# Patient Record
Sex: Male | Born: 1935 | Race: White | Hispanic: No | Marital: Married | Smoking: Former smoker
Health system: Southern US, Community
[De-identification: ages and names within clinical notes are randomized; demographics above are authoritative.]

## PROBLEM LIST (undated history)

## (undated) DIAGNOSIS — I6381 Other cerebral infarction due to occlusion or stenosis of small artery: Secondary | ICD-10-CM

## (undated) DIAGNOSIS — A770 Spotted fever due to Rickettsia rickettsii: Secondary | ICD-10-CM

## (undated) DIAGNOSIS — I700 Atherosclerosis of aorta: Principal | ICD-10-CM

## (undated) DIAGNOSIS — I10 Essential (primary) hypertension: Secondary | ICD-10-CM

## (undated) DIAGNOSIS — R011 Cardiac murmur, unspecified: Secondary | ICD-10-CM

## (undated) DIAGNOSIS — K627 Radiation proctitis: Secondary | ICD-10-CM

## (undated) DIAGNOSIS — I350 Nonrheumatic aortic (valve) stenosis: Secondary | ICD-10-CM

## (undated) DIAGNOSIS — C61 Malignant neoplasm of prostate: Secondary | ICD-10-CM

## (undated) DIAGNOSIS — I5043 Acute on chronic combined systolic (congestive) and diastolic (congestive) heart failure: Secondary | ICD-10-CM

## (undated) DIAGNOSIS — M75101 Unspecified rotator cuff tear or rupture of right shoulder, not specified as traumatic: Secondary | ICD-10-CM

## (undated) DIAGNOSIS — E7800 Pure hypercholesterolemia, unspecified: Secondary | ICD-10-CM

## (undated) DIAGNOSIS — G459 Transient cerebral ischemic attack, unspecified: Secondary | ICD-10-CM

## (undated) DIAGNOSIS — G9340 Encephalopathy, unspecified: Secondary | ICD-10-CM

## (undated) DIAGNOSIS — H3530 Unspecified macular degeneration: Secondary | ICD-10-CM

## (undated) DIAGNOSIS — R7301 Impaired fasting glucose: Secondary | ICD-10-CM

## (undated) DIAGNOSIS — E1129 Type 2 diabetes mellitus with other diabetic kidney complication: Secondary | ICD-10-CM

## (undated) DIAGNOSIS — R809 Proteinuria, unspecified: Secondary | ICD-10-CM

## (undated) DIAGNOSIS — I639 Cerebral infarction, unspecified: Secondary | ICD-10-CM

## (undated) DIAGNOSIS — I5042 Chronic combined systolic (congestive) and diastolic (congestive) heart failure: Secondary | ICD-10-CM

## (undated) DIAGNOSIS — I4891 Unspecified atrial fibrillation: Secondary | ICD-10-CM

## (undated) DIAGNOSIS — G4733 Obstructive sleep apnea (adult) (pediatric): Secondary | ICD-10-CM

## (undated) HISTORY — DX: Unspecified rotator cuff tear or rupture of right shoulder, not specified as traumatic: M75.101

## (undated) HISTORY — DX: Unspecified macular degeneration: H35.30

## (undated) HISTORY — DX: Nonrheumatic aortic (valve) stenosis: I35.0

## (undated) HISTORY — DX: Malignant neoplasm of prostate: C61

## (undated) HISTORY — DX: Chronic combined systolic (congestive) and diastolic (congestive) heart failure: I50.42

## (undated) HISTORY — DX: Radiation proctitis: K62.7

## (undated) HISTORY — DX: Transient cerebral ischemic attack, unspecified: G45.9

## (undated) HISTORY — PX: TONSILLECTOMY AND ADENOIDECTOMY: SUR1326

## (undated) HISTORY — DX: Unspecified atrial fibrillation: I48.91

## (undated) HISTORY — DX: Essential (primary) hypertension: I10

## (undated) HISTORY — PX: OTHER SURGICAL HISTORY: SHX169

## (undated) HISTORY — DX: Proteinuria, unspecified: R80.9

## (undated) HISTORY — DX: Type 2 diabetes mellitus with other diabetic kidney complication: E11.29

## (undated) HISTORY — DX: Obstructive sleep apnea (adult) (pediatric): G47.33

## (undated) HISTORY — DX: Impaired fasting glucose: R73.01

## (undated) HISTORY — DX: Pure hypercholesterolemia, unspecified: E78.00

## (undated) SURGERY — COLONOSCOPY
Anesthesia: Moderate Sedation

---
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
## 2008-02-02 ENCOUNTER — Ambulatory Visit (HOSPITAL_COMMUNITY): Admission: RE | Admit: 2008-02-02 | Discharge: 2008-02-02 | Payer: Self-pay | Admitting: Urology

## 2008-02-12 ENCOUNTER — Ambulatory Visit (HOSPITAL_COMMUNITY): Admission: RE | Admit: 2008-02-12 | Discharge: 2008-02-12 | Payer: Self-pay | Admitting: Urology

## 2008-03-01 ENCOUNTER — Ambulatory Visit: Admission: RE | Admit: 2008-03-01 | Discharge: 2008-03-25 | Payer: Self-pay | Admitting: Radiation Oncology

## 2008-06-13 ENCOUNTER — Ambulatory Visit: Admission: RE | Admit: 2008-06-13 | Discharge: 2008-07-31 | Payer: Self-pay | Admitting: Radiation Oncology

## 2008-08-16 ENCOUNTER — Ambulatory Visit (HOSPITAL_BASED_OUTPATIENT_CLINIC_OR_DEPARTMENT_OTHER): Admission: RE | Admit: 2008-08-16 | Discharge: 2008-08-16 | Payer: Self-pay | Admitting: Urology

## 2008-09-05 ENCOUNTER — Ambulatory Visit: Admission: RE | Admit: 2008-09-05 | Discharge: 2008-10-22 | Payer: Self-pay | Admitting: Radiation Oncology

## 2010-08-04 ENCOUNTER — Ambulatory Visit: Payer: Self-pay | Admitting: Family Medicine

## 2010-08-05 ENCOUNTER — Ambulatory Visit: Payer: BLUE CROSS/BLUE SHIELD | Admitting: Family Medicine

## 2010-08-13 ENCOUNTER — Ambulatory Visit (INDEPENDENT_AMBULATORY_CARE_PROVIDER_SITE_OTHER): Payer: BLUE CROSS/BLUE SHIELD

## 2010-08-13 DIAGNOSIS — R7301 Impaired fasting glucose: Secondary | ICD-10-CM

## 2010-09-10 LAB — POCT I-STAT, CHEM 8
Creatinine, Ser: 1 mg/dL (ref 0.4–1.5)
HCT: 49 % (ref 39.0–52.0)
Hemoglobin: 16.7 g/dL (ref 13.0–17.0)
Potassium: 4.7 mEq/L (ref 3.5–5.1)
Sodium: 139 mEq/L (ref 135–145)
TCO2: 30 mmol/L (ref 0–100)

## 2010-09-10 LAB — COMPREHENSIVE METABOLIC PANEL
ALT: 35 U/L (ref 0–53)
AST: 33 U/L (ref 0–37)
Alkaline Phosphatase: 42 U/L (ref 39–117)
CO2: 33 mEq/L — ABNORMAL HIGH (ref 19–32)
Calcium: 9.6 mg/dL (ref 8.4–10.5)
GFR calc Af Amer: >60 mL/min (ref >60)
Potassium: 5.2 mEq/L — ABNORMAL HIGH (ref 3.5–5.1)
Sodium: 140 mEq/L (ref 135–145)
Total Protein: 6.8 g/dL (ref 6.0–8.3)

## 2010-09-10 LAB — APTT: aPTT: 29 seconds (ref 24–37)

## 2010-09-10 LAB — CBC
MCHC: 33.6 g/dL (ref 30.0–36.0)
RBC: 4.48 MIL/uL (ref 4.22–5.81)
RDW: 13.7 % (ref 11.5–15.5)

## 2010-10-13 NOTE — Op Note (Signed)
NAMEOAKLEY, KOSSMAN NO.:  [id number]   MEDICAL RECORD NO.:  [id number]          PATIENT TYPE:  AMB   LOCATION:  [facility]                         FACILITY:  [facility]   PHYSICIAN:  Ronald L. Earlene Plater, M.D.  DATE OF BIRTH:  02/19/1933   DATE OF PROCEDURE:  08/16/2008  DATE OF DISCHARGE:                               OPERATIVE REPORT   DIAGNOSIS:  Adenocarcinoma of the prostate.   OPERATIVE PROCEDURE:  Robotic arm Nucletron seed implantation  transperineally with iodine 125 seeds and flexible cystourethroscopy.   SURGEON:  Lucrezia Starch. Earlene Plater, M.D.   ASSISTANT:  Artist Pais. Kathrynn Running, M.D.   ANESTHESIA:  General LMA.   ESTIMATED BLOOD LOSS:  15 mL.   TUBES:  None.   COMPLICATIONS:  None.   A total of 69 seeds were implanted with 22 needles at 31.3950 mCi total  apparent activity.   INDICATIONS FOR PROCEDURE:  Mr. Ragone is a very nice 77 year old white  male who presented with an elevated PSA of 4.6 with a free-to-total  ratio of 9.8%.  He subsequently went biopsy of the prostate which  revealed 60% Gleason score 7,  3 + 4 from the right apex and 25% Gleason  score 7, 3 + 4 from the left mid prostate.  Metastatic workup was  negative and he elected to undergo a combination of external beam and  seed.  He has completed his IMRT and is now is for seed radioactive seed  implantation.   DESCRIPTION OF PROCEDURE:  The patient was placed in the supine position  after proper LMA anesthesia and was placed in the dorsal lithotomy  position, prepped and draped with Betadine in a sterile fashion.  A 16-  French Foley catheter was inserted and the bladder was drained.  The B  and K biplanar transrectal ultrasound probe was placed and the prostate  was scanned both axially and sagittally and in 3 dimensions.  Dosimetry  was then calculated for the urethra, the prostate and the rectum, and  dosages were confirmed.  Two holding needles were used on unused  coordinates and after the  electronic and physical grid had been placed.  The base needle was then implanted and serial implantation was performed  in the preplanned coordinates.  A total of 69 iodine 125 seeds were  implanted with 22 needles at 31.3950 mCi total apparent activity.  We  were very comfortable with the location of the seeds.  All hardware was  removed and static images obtained with fluoroscopy, confirming the  location of the seeds.  The wound was dressed sterilely and the patient  was placed in a supine position.  The Foley catheter was removed and  scanned for seeds and there were none within it.  Flexible  cystourethroscopy was then performed with an Olympus flexible  cystourethroscope.  There was moderate trilobar hypertrophy with some  slight prostatic inflammation.  The  bladder had grade 1 trabeculation.  Efflux of clear urine was noted from the normally-placed ureteral  orifices  bilaterally and there were no lesions or seeds or spacers in the bladder  or the urethra.  The flexible cystourethroscope was visually removed and  a new 16-French Foley catheter was inserted and the bladder was drained  clear.  The patient tolerated the procedure well with no complications.  He was taken to the recovery room stable.      Ronald L. Earlene Plater, M.D.  Electronically Signed     RLD/MEDQ  D:  08/16/2008  T:  08/16/2008  Job:  [id number]

## 2010-10-30 DIAGNOSIS — M75101 Unspecified rotator cuff tear or rupture of right shoulder, not specified as traumatic: Secondary | ICD-10-CM

## 2010-10-30 HISTORY — DX: Unspecified rotator cuff tear or rupture of right shoulder, not specified as traumatic: M75.101

## 2010-11-26 ENCOUNTER — Ambulatory Visit
Admission: RE | Admit: 2010-11-26 | Discharge: 2010-11-26 | Disposition: A | Discharge status: Home / Self Care | Payer: Medicare Other | Source: Ambulatory Visit | Attending: Family Medicine | Admitting: Family Medicine

## 2010-11-26 ENCOUNTER — Encounter: Payer: Self-pay | Admitting: Family Medicine

## 2010-11-26 ENCOUNTER — Ambulatory Visit (INDEPENDENT_AMBULATORY_CARE_PROVIDER_SITE_OTHER): Payer: BLUE CROSS/BLUE SHIELD | Admitting: Family Medicine

## 2010-11-26 VITALS — BP 128/82 | HR 80 | Ht 71.0 in | Wt 176.0 lb

## 2010-11-26 DIAGNOSIS — R7301 Impaired fasting glucose: Secondary | ICD-10-CM

## 2010-11-26 DIAGNOSIS — I10 Essential (primary) hypertension: Secondary | ICD-10-CM

## 2010-11-26 DIAGNOSIS — M25511 Pain in right shoulder: Secondary | ICD-10-CM

## 2010-11-26 DIAGNOSIS — C61 Malignant neoplasm of prostate: Secondary | ICD-10-CM | POA: Insufficient documentation

## 2010-11-26 DIAGNOSIS — E7800 Pure hypercholesterolemia, unspecified: Secondary | ICD-10-CM | POA: Insufficient documentation

## 2010-11-26 DIAGNOSIS — M25519 Pain in unspecified shoulder: Secondary | ICD-10-CM

## 2010-11-26 LAB — BASIC METABOLIC PANEL
CO2: 28 mEq/L (ref 19–32)
Chloride: 102 mEq/L (ref 96–112)
Potassium: 5.3 mEq/L (ref 3.5–5.3)

## 2010-11-26 MED ORDER — NAPROXEN 500 MG PO TABS: 500.0000 mg | ORAL_TABLET | Freq: Two times a day (BID) | ORAL | Status: DC

## 2010-11-26 NOTE — Progress Notes (Signed)
Patient presents with complaint of Right shoulder pain since he fell on 6/15.  Tripped while mowing his yard, and landed on his right shoulder and wrist.  R wrist was a little sore initially, but resolved.  The shoulder felt a little better for a day or so, but now is really bothering him.  Has pain brushing his teeth, combing his hair.  He has limited ROM due to the discomfort.  Takes Tylenol Arthritis (regularly), and this seems to help some. Hasn't taken any other pain medications, or tried ice or heat yet.  Has trouble getting comfortable at night to sleep due to pain in shoulder.  Past Medical History  Diagnosis Date  . Hypertension   . Hypercholesteremia   . Impaired fasting glucose   . Prostate cancer radiation + seeding implant (Dr.[name])    Past Surgical History  Procedure Date  . Tonsillectomy and adenoidectomy age 49  . Prostate seed implant     History   Social History  . Marital Status: Married    Spouse Name: N/A    Number of Children: N/A  . Years of Education: N/A   Occupational History  . Not on file.   Social History Main Topics  . Smoking status: Former Smoker    Quit date: 05/31/1980  . Smokeless tobacco: Not on file  . Alcohol Use: Yes     1-2 beers a month.  . Drug Use: No  . Sexually Active: Not on file   Other Topics Concern  . Not on file   Social History Narrative  . No narrative on file    Family History  Problem Relation Age of Onset  . Heart disease Father   . Stroke Sister    Current outpatient prescriptions:Acetaminophen (TYLENOL ARTHRITIS PAIN PO), Take 2 tablets by mouth 2 (two) times daily.  , Disp: , Rfl: ;  aspirin 81 MG tablet, Take 81 mg by mouth daily.  , Disp: , Rfl: ;  diphenhydrAMINE (SOMINEX) 25 MG tablet, Take 25 mg by mouth at bedtime as needed.  , Disp: , Rfl: ;  lovastatin (MEVACOR) 20 MG tablet, Take 40 mg by mouth at bedtime.  , Disp: , Rfl:  metoprolol (TOPROL-XL) 50 MG 24 hr tablet, Take 75 mg by mouth daily. 1 at  night and 1/2 in am , Disp: , Rfl: ;  Multiple Vitamins-Minerals (EYE VITAMINS PO), Take 1 tablet by mouth daily. Patient states vitamin is actually called "pressure vision," supplement for glaucoma. , Disp: , Rfl: ;  Tamsulosin HCl (FLOMAX) 0.4 MG CAPS, Take 0.4 mg by mouth daily.  , Disp: , Rfl:  naproxen (NAPROSYN) 500 MG tablet, Take 1 tablet (500 mg total) by mouth 2 (two) times daily with a meal. Take as needed for pain, Disp: 30 tablet, Rfl: 0  No Known Allergies  ROS:  Denies headaches, numbness, tingling, skin rash, GI complaints, or other concerns.  Denies URI symptoms, SOB, chest pain  PHYSICAL EXAM: BP 128/82  Pulse 80  Ht 5\' 11"  (1.803 m)  Wt 176 lb (79.833 kg)  BMI 24.55 kg/m2 Repeat BP 170/82, which is more consistent with the white coat HTN he usually displays at the office.  Pleasant, elderly gentleman in no distress at rest, some discomfort with arm movement. Neck--no spinal tenderness or muscle spasm, no lymphadenopathy or mass. R shoulder: Some tenderness at R acromion (posteriorly).  Pain with ROM R shoulder. Active and passive abduction only to about 90-100 degrees, forward flexion to about 90 degrees.  Pain with abduction, although no pain and full strength with deltoid testing.  Pain and weakness (vs poor effort due to pain) with internal and external rotation of R shoulder.  Strength 5/5, normal sensation, 2+ pulse  ASSESSMENT/PLAN: 1. Shoulder pain, right  naproxen (NAPROSYN) 500 MG tablet, DG Shoulder Right   injured 6/15 after a fall.  Elements of adhesive capsulitis, +/- rotator cuff tear  2. Essential hypertension, benign  Basic metabolic panel  3. Impaired fasting glucose  Hemoglobin A1c  4. Pure hypercholesterolemia    5. Shoulder pain, right  naproxen (NAPROSYN) 500 MG tablet, DG Shoulder Right   Will check b-met today because it has been over a year since last chem panel with creatinine, and bc I'd like to know renal function since rx'ing NSAIDs.  He has h/o  elevated glu, and no A1c in the last 6 months. NSAID precautions reviewed.  He clearly has some adhesive capsulitis, and may also have rotator cuff tear.  Will check x-rays, and start conservative treatment with ROM exercises and NSAIDs.  Follow up in 1-2 weeks, sooner if worsening.  May need cortisone shot, PT and/or MRI if not improving

## 2010-11-26 NOTE — Patient Instructions (Signed)
Go get xray from [facility] imaging) Do range of motion exercises at least 2-3 times daily (shown to you at visit) Take anti-inflammatories twice daily with food until it feels better Follow up in 1-2 weeks.  May need cortisone shot vs. MRI vs. Physical Therapy, depending on how you're doing

## 2010-11-27 ENCOUNTER — Telehealth: Payer: Self-pay | Admitting: *Deleted

## 2010-11-27 LAB — HEMOGLOBIN A1C
Hgb A1c MFr Bld: 6.2 % — ABNORMAL HIGH (ref <5.7)
Mean Plasma Glucose: 131 mg/dL — ABNORMAL HIGH (ref <117)

## 2010-11-27 NOTE — Telephone Encounter (Addendum)
Message copied by Dorthula Perfect on Fri Nov 27, 2010  1:33 PM ------      Message from: ZOXWR, EVE      Created: Fri Nov 27, 2010  9:50 AM       Advise pt that kidney function is normal.  It is fine for him to take anti-inflammatories as recommended.  His sugar was 125 (not fasting), and A1c was 6.2, which indicates pre-diabetes (similar to past).  Continue to avoid sweets, limit carbs; daily exercise and weight loss encouraged.   Pt notified of lab results.  CM, LPN

## 2010-11-30 ENCOUNTER — Encounter: Payer: Self-pay | Admitting: Family Medicine

## 2010-12-03 ENCOUNTER — Ambulatory Visit (INDEPENDENT_AMBULATORY_CARE_PROVIDER_SITE_OTHER): Payer: BLUE CROSS/BLUE SHIELD | Admitting: Family Medicine

## 2010-12-03 ENCOUNTER — Encounter: Payer: Self-pay | Admitting: Family Medicine

## 2010-12-03 VITALS — BP 144/88 | HR 68 | Ht 71.0 in | Wt 176.0 lb

## 2010-12-03 DIAGNOSIS — M25519 Pain in unspecified shoulder: Secondary | ICD-10-CM

## 2010-12-03 DIAGNOSIS — M25511 Pain in right shoulder: Secondary | ICD-10-CM

## 2010-12-03 NOTE — Patient Instructions (Signed)
Call next week and let me know if you are having ongoing weakness in the right arm.  I suspect that you will continue to have weakness, and will need an MRI.  Call us next week and we will get it scheduled for you.

## 2010-12-03 NOTE — Progress Notes (Signed)
Patient presents for followup on right shoulder pain.  Has been taking the naprosyn without side effects.  Notices significant improvement in range of motion and decreased pain.  Still having some pain, especially at night, when trying to sleep.  But overall, even the night time pain is better.  Still having problems with pain with external rotation of R shoulder.  Past Medical History  Diagnosis Date  . Hypertension   . Hypercholesteremia   . Impaired fasting glucose   . Prostate cancer radiation + seeding implant (Dr.[name])    Past Surgical History  Procedure Date  . Tonsillectomy and adenoidectomy age 16  . Prostate seed implant     History   Social History  . Marital Status: Married    Spouse Name: N/A    Number of Children: N/A  . Years of Education: N/A   Occupational History  . Not on file.   Social History Main Topics  . Smoking status: Former Smoker    Quit date: 05/31/1980  . Smokeless tobacco: Not on file  . Alcohol Use: Yes     1-2 beers a month.  . Drug Use: No  . Sexually Active: Not on file   Other Topics Concern  . Not on file   Social History Narrative  . No narrative on file    Family History  Problem Relation Age of Onset  . Heart disease Father   . Stroke Sister     Current outpatient prescriptions:Acetaminophen (TYLENOL ARTHRITIS PAIN PO), Take 2 tablets by mouth 2 (two) times daily.  , Disp: , Rfl: ;  aspirin 81 MG tablet, Take 81 mg by mouth daily.  , Disp: , Rfl: ;  diphenhydrAMINE (SOMINEX) 25 MG tablet, Take 25 mg by mouth at bedtime as needed.  , Disp: , Rfl: ;  lovastatin (MEVACOR) 20 MG tablet, Take 40 mg by mouth at bedtime.  , Disp: , Rfl:  metoprolol (TOPROL-XL) 50 MG 24 hr tablet, Take 75 mg by mouth daily. 1 at night and 1/2 in am , Disp: , Rfl: ;  Multiple Vitamins-Minerals (EYE VITAMINS PO), Take 1 tablet by mouth daily. Patient states vitamin is actually called "pressure vision," supplement for glaucoma. , Disp: , Rfl: ;  naproxen  (NAPROSYN) 500 MG tablet, Take 1 tablet (500 mg total) by mouth 2 (two) times daily with a meal. Take as needed for pain, Disp: 30 tablet, Rfl: 0 Tamsulosin HCl (FLOMAX) 0.4 MG CAPS, Take 0.4 mg by mouth daily.  , Disp: , Rfl:   No Known Allergies  ROS: Denies headaches, chest pain.  Denies numbness/tingling.  Denies abdominal pain, bleeding or bruising.  PHYSICAL EXAM: BP 144/88  Pulse 68  Ht 5\' 11"  (1.803 m)  Wt 176 lb (79.833 kg)  BMI 24.55 kg/m2 Well developed, pleasant male, accompanied by his wife, in no distress R shoulder--no tenderness at [facility] joint or elsewhere.  Strength is 5/5 except significantly diminished (3/5) with external rotation against resistance.  Also decreased ROM with external rotation.  ROM with abduction 150 degrees, forward flexion 135 degrees--significantly improved from last week. 2+ pulses, brisk cap refill  ASSESSMENT/PLAN: 1. Shoulder pain, right    due to ongoing weakness with external rotation, suspect rotator cuff tear. Improvement in ROM and pain with NSAIDs.  Declines MRI--wants to wait 1 week   Continue the Naproxen BID.  Expect him to have improvement in pain, but ongoing weakness--taught wife how to check.  Call next week to order MRI if ongoing weakness.  Discussed that if MRI is abnormal, I would refer to ortho--but to have good discussion with ortho about whether or not repair is indicated in his situation

## 2010-12-10 ENCOUNTER — Other Ambulatory Visit: Payer: Self-pay | Admitting: Family Medicine

## 2010-12-10 ENCOUNTER — Telehealth: Payer: Self-pay | Admitting: *Deleted

## 2010-12-10 NOTE — Telephone Encounter (Signed)
Advise pt--I'm happy to give him more time.  If he is feeling better, even if there is some residual weakness, we don't HAVE to proceed with MRI.  If he has persistent weakness, he likely has a rotator cuff tear.  But if he is otherwise asymptomatic, then we wouldn't proceed with surgical intervention, so no reason to rush to get an MRI.  Follow up for re-evaluation (and to have MRI ordered) if he is having ongoing pain, or weakness that interferes with his daily activities

## 2010-12-10 NOTE — Telephone Encounter (Signed)
Patient called with an update of his progress, he said you instructed him to do so. He states that he has really improved some. His ROM is almost normal, strength still week but has gained strength since last week. He states that he is almost no pain and his shoulder is no longer bothering him at night. He wonders if it just needs some more time? Thanks.

## 2010-12-10 NOTE — Telephone Encounter (Signed)
Spoke with patient, he will give it some time and he will schedule appt with Dr.Knapp for re evaluation if he is having ongoing pain, or weakness that interferes with his daily activities.

## 2010-12-11 NOTE — Telephone Encounter (Signed)
Is it ok to fill this for pt?

## 2010-12-28 ENCOUNTER — Telehealth: Payer: Self-pay | Admitting: *Deleted

## 2010-12-28 NOTE — Telephone Encounter (Signed)
Patient stating that

## 2010-12-31 ENCOUNTER — Ambulatory Visit (INDEPENDENT_AMBULATORY_CARE_PROVIDER_SITE_OTHER): Payer: BLUE CROSS/BLUE SHIELD | Admitting: Family Medicine

## 2010-12-31 ENCOUNTER — Encounter: Payer: Self-pay | Admitting: Family Medicine

## 2010-12-31 VITALS — BP 132/82 | HR 72 | Ht 71.0 in | Wt 172.0 lb

## 2010-12-31 DIAGNOSIS — K921 Melena: Secondary | ICD-10-CM

## 2010-12-31 DIAGNOSIS — M25511 Pain in right shoulder: Secondary | ICD-10-CM

## 2010-12-31 DIAGNOSIS — I10 Essential (primary) hypertension: Secondary | ICD-10-CM

## 2010-12-31 DIAGNOSIS — M25519 Pain in unspecified shoulder: Secondary | ICD-10-CM

## 2010-12-31 NOTE — Patient Instructions (Signed)
Avoid anti-inflammatories and aspirin.  Send in hemoccult cards next week (or the week after).  Will refer to GI if there is blood in the stool (microscopic).  If there is no blood seen in stool on cards, it is still recommended that you have a routine colonoscopy--please consider this.  We are scheduling MRI, and once results are known, expect to refer you to orthopedist (Dr. Luiz Blare, as you request)

## 2010-12-31 NOTE — Progress Notes (Signed)
Patient presents to follow up on R shoulder pain.  He is no longer having as much pain, no longer being kept awake at night with pain.  Still has weakness when trying to raise arm, arm seems to fatigue easily, even just with raising coffee cup to mouth--arm is tired before finishing the coffee.  Less pain, but ongoing weakness.  This is interfering with his normal activities, relying more on use of his left arm.  He is now ready for MRI (previously offered).  10-14 days ago noted blood in stool.  Thought it might have been related to the Naprosyn, so he stopped it 10 days ago, and also stopped the Tylenol Arthritis.  No further blood in his stool--none for about 5-6 days.  Initially was smear of blood noted on toilet paper.  While at the beach, the blood turned the bowl pink.  Has been having some constipation and straining recently.  HTN--BP's are improved at home, rarely over 140.  Past Medical History  Diagnosis Date  . Hypertension   . Hypercholesteremia   . Impaired fasting glucose   . Prostate cancer radiation + seeding implant (Dr.[name])    Past Surgical History  Procedure Date  . Tonsillectomy and adenoidectomy age 34  . Prostate seed implant     History   Social History  . Marital Status: Married    Spouse Name: N/A    Number of Children: N/A  . Years of Education: N/A   Occupational History  . Not on file.   Social History Main Topics  . Smoking status: Former Smoker    Quit date: 05/31/1980  . Smokeless tobacco: Not on file  . Alcohol Use: Yes     1-2 beers a month.  . Drug Use: No  . Sexually Active: Not on file   Other Topics Concern  . Not on file   Social History Narrative  . No narrative on file    Family History  Problem Relation Age of Onset  . Heart disease Father   . Stroke Sister     Current outpatient prescriptions:diphenhydrAMINE (SOMINEX) 25 MG tablet, Take 25 mg by mouth at bedtime as needed.  , Disp: , Rfl: ;  lovastatin (MEVACOR) 20 MG  tablet, Take 40 mg by mouth at bedtime.  , Disp: , Rfl: ;  metoprolol (TOPROL-XL) 50 MG 24 hr tablet, Take 75 mg by mouth daily. 1 at night and 1/2 in am , Disp: , Rfl:  Multiple Vitamins-Minerals (EYE VITAMINS PO), Take 1 tablet by mouth daily. Patient states vitamin is actually called "pressure vision," supplement for glaucoma. , Disp: , Rfl: ;  Tamsulosin HCl (FLOMAX) 0.4 MG CAPS, Take 0.4 mg by mouth daily.  , Disp: , Rfl: ;  Acetaminophen (TYLENOL ARTHRITIS PAIN PO), Take 2 tablets by mouth 2 (two) times daily.  , Disp: , Rfl: ;  aspirin 81 MG tablet, Take 81 mg by mouth daily.  , Disp: , Rfl:  naproxen (NAPROSYN) 500 MG tablet, TAKE 1 TABLET (500 MG TOTAL) BY MOUTH 2 (TWO) TIMES DAILY WITH A MEAL. TAKE AS NEEDED FOR PAIN, Disp: 30 tablet, Rfl: 0 (stopped Tylenol, aspirin and Naprosyn after noting blood in stool 10-14 days ago)  No Known Allergies  ROS:  Denies abdominal pain, fever, numbness, tingling.  + arm weakness. +recent blood in stool, resolved. No headaches, chest pain or other concerns.  PHYSICAL EXAM: BP 132/82  Pulse 72  Ht 5\' 11"  (1.803 m)  Wt 172 lb (78.019 kg)  BMI 23.99 kg/m2 Pleasant, elderly male, in no distress FROM upper extremities, except for somewhat limited external rotation.  Significant weakness with external rotation against resistance and with supraspinatous testing RUE.  Remainder of strength intact.  Normal sensation. 2+ distal pulses  ASSESSMENT/PLAN: 1. Shoulder pain, right  MR Shoulder Right Wo Contrast   suspect rotator cuff tear.  get MRI, and refer to ortho (pt requests Dr. Luiz Blare)  2. Blood in stool     likely related to NSAID use.  Has never had colonoscopy (declines), which is encouraged.  Avoid NSAID's and ASA and check hemoccult cards next week  3. Essential hypertension, benign     controlled  4. Shoulder pain, right  MR Shoulder Right Wo Contrast    Suspect R rotator cuff tear.  Check MRI and refer to ortho (Dr. Luiz Blare) after results  received  Has never had a colonoscopy--has declined it in the past.  Hasn't done hemoccult cards in a while.  Plan: given hemoccult cards.  Even if negative, he is to strongly consider colonoscopy (highly recommended by me).  If positive, we will refer to GI

## 2011-01-01 ENCOUNTER — Ambulatory Visit
Admission: RE | Admit: 2011-01-01 | Discharge: 2011-01-01 | Disposition: A | Discharge status: Home / Self Care | Payer: Medicare Other | Source: Ambulatory Visit | Attending: Family Medicine | Admitting: Family Medicine

## 2011-01-01 ENCOUNTER — Encounter: Payer: Self-pay | Admitting: Family Medicine

## 2011-01-01 ENCOUNTER — Telehealth: Payer: Self-pay

## 2011-01-01 DIAGNOSIS — M75101 Unspecified rotator cuff tear or rupture of right shoulder, not specified as traumatic: Secondary | ICD-10-CM | POA: Insufficient documentation

## 2011-01-01 DIAGNOSIS — M25511 Pain in right shoulder: Secondary | ICD-10-CM

## 2011-01-01 NOTE — Telephone Encounter (Signed)
Pt informed and request sent with records

## 2011-02-03 ENCOUNTER — Other Ambulatory Visit: Payer: BLUE CROSS/BLUE SHIELD

## 2011-02-03 ENCOUNTER — Encounter: Payer: Self-pay | Admitting: Family Medicine

## 2011-02-03 DIAGNOSIS — K921 Melena: Secondary | ICD-10-CM

## 2011-02-03 LAB — POC HEMOCCULT BLD/STL (HOME/3-CARD/SCREEN)
Card #2 Fecal Occult Blod, POC: NEGATIVE
Card #3 Fecal Occult Blood, POC: NEGATIVE
Fecal Occult Blood, POC: NEGATIVE

## 2011-02-04 ENCOUNTER — Ambulatory Visit (INDEPENDENT_AMBULATORY_CARE_PROVIDER_SITE_OTHER): Payer: BLUE CROSS/BLUE SHIELD | Admitting: Family Medicine

## 2011-02-04 ENCOUNTER — Encounter: Payer: Self-pay | Admitting: Family Medicine

## 2011-02-04 DIAGNOSIS — R011 Cardiac murmur, unspecified: Secondary | ICD-10-CM

## 2011-02-04 DIAGNOSIS — R7301 Impaired fasting glucose: Secondary | ICD-10-CM

## 2011-02-04 DIAGNOSIS — I10 Essential (primary) hypertension: Secondary | ICD-10-CM

## 2011-02-04 DIAGNOSIS — L259 Unspecified contact dermatitis, unspecified cause: Secondary | ICD-10-CM

## 2011-02-04 DIAGNOSIS — E7800 Pure hypercholesterolemia, unspecified: Secondary | ICD-10-CM

## 2011-02-04 DIAGNOSIS — L309 Dermatitis, unspecified: Secondary | ICD-10-CM

## 2011-02-04 DIAGNOSIS — M75101 Unspecified rotator cuff tear or rupture of right shoulder, not specified as traumatic: Secondary | ICD-10-CM

## 2011-02-04 MED ORDER — NYSTATIN-TRIAMCINOLONE 100000-0.1 UNIT/GM-% EX OINT: TOPICAL_OINTMENT | Freq: Two times a day (BID) | CUTANEOUS | Status: DC

## 2011-02-04 NOTE — Progress Notes (Signed)
Patient presents for 6 month follow-up.  BP's at home usually 130-140/70-75.  Yesterday was a little lower at 113/70.  His machine has been verified in the past.  Denies headaches, chest pain, palpitations, edema, shortness of breath.  He can tell when BP is running a little low, like yesterday, feels slightly dizzy.  Impaired fasting glucose/pre-diabetes.  Denies polydipsia or polyuria, although has some increased frequency at night. Still drinking 2-3 beers/night.  He has noted "pimples" on his neck and upper chest, gradually for months, worse recently.  They are irritating, not pruritic, but finds he "messes with them".  Wondering if it could be from the Lovastatin.  Saw Dr. Luiz Blare for his rotator cuff tear.   Gave him a cortisone shot, giving him a little more time.  Has an appointment next week to decide about surgery.  The pain is controllable, but has ongoing weakness which affects him daily--hard to drink a cup of coffee, unable to fish.  He would like to proceed with surgery (if recommended to him at his appt next week).  Past Medical History  Diagnosis Date  . Hypertension   . Hypercholesteremia   . Impaired fasting glucose   . Prostate cancer radiation + seeding implant (Dr.[name])  . Rotator cuff tear, right 10/2010    supraspinatous and infraspinatous    Past Surgical History  Procedure Date  . Tonsillectomy and adenoidectomy age 77  . Prostate seed implant     History   Social History  . Marital Status: Married    Spouse Name: N/A    Number of Children: N/A  . Years of Education: N/A   Occupational History  . Not on file.   Social History Main Topics  . Smoking status: Former Smoker    Quit date: 05/31/1980  . Smokeless tobacco: Not on file  . Alcohol Use: Yes     2-3 beers/night  . Drug Use: No  . Sexually Active: Not on file   Other Topics Concern  . Not on file   Social History Narrative  . No narrative on file    Family History  Problem Relation Age  of Onset  . Heart disease Father   . Stroke Sister    Current Outpatient Prescriptions on File Prior to Visit  Medication Sig Dispense Refill  . Acetaminophen (TYLENOL ARTHRITIS PAIN PO) Take 2 tablets by mouth 2 (two) times daily.        [name] aspirin 81 MG tablet Take 81 mg by mouth daily.        . diphenhydrAMINE (SOMINEX) 25 MG tablet Take 25 mg by mouth at bedtime as needed.        . lovastatin (MEVACOR) 20 MG tablet Take 40 mg by mouth at bedtime.        . metoprolol (TOPROL-XL) 50 MG 24 hr tablet Take 75 mg by mouth daily. 1 at night and 1/2 in am       . Multiple Vitamins-Minerals (EYE VITAMINS PO) Take 1 tablet by mouth daily. Patient states vitamin is actually called "pressure vision," supplement for glaucoma.       . Tamsulosin HCl (FLOMAX) 0.4 MG CAPS Take 0.4 mg by mouth daily.         No Known Allergies  ROS:  Denies headaches, chest pain, URI symptoms, fever, SOB.  Some urinary frequency mainly at night, no voiding issues during day.  Denies fever, URI symptoms, or other concerns. + rash per HPI  PHYSICAL EXAM: Pleasant, elderly male  in no distress.  Mildly anxious-appearing BP 184/90  Pulse 64  Temp(Src) 97.6 F (36.4 C) (Oral)  Ht 5\' 11"  (1.803 m)  Wt 172 lb (78.019 kg)  BMI 23.99 kg/m2 Neck: no lymphadenopathy Heart: 3/6 SEM, loudest at RUSB Lungs: clear bilaterally Skin: Neck and chest--flaky rash, some round patches, others more confluent.  Slightly pink, no warmth.  No central clearing Extremities: no edema  ASSESSMENT/PLAN: 1. Essential hypertension, benign  Comprehensive metabolic panel, Ambulatory referral to Cardiology  2. Impaired fasting glucose    3. Pure hypercholesterolemia  Lipid panel  4. Rotator cuff tear, right    5. Heart murmur  Ambulatory referral to Cardiology  6. Dermatitis  nystatin-triamcinolone (MYCOLOG) ointment    Refer to cardiology--eval murmur with echo and pre-op assessment, as he will likely be having shoulder surgery in near  future Dermatitis--doubt related to medications.  Treat with steroid/antifungal combination for 1-2 weeks.  If no improvement, refer to dermatologist. White coat component to HTN--continue to monitor at home  Plans to get flu shot with his wife at pharmacy  F/u 6 months, sooner prn

## 2011-02-05 ENCOUNTER — Encounter: Payer: Self-pay | Admitting: Family Medicine

## 2011-02-05 DIAGNOSIS — E7800 Pure hypercholesterolemia, unspecified: Secondary | ICD-10-CM

## 2011-02-05 DIAGNOSIS — E1169 Type 2 diabetes mellitus with other specified complication: Secondary | ICD-10-CM | POA: Insufficient documentation

## 2011-02-05 LAB — LIPID PANEL
Cholesterol: 193 mg/dL (ref 0–200)
LDL Cholesterol: 100 mg/dL — ABNORMAL HIGH (ref 0–99)
Total CHOL/HDL Ratio: 3 Ratio
VLDL: 28 mg/dL (ref 0–40)

## 2011-02-05 LAB — COMPREHENSIVE METABOLIC PANEL
ALT: 30 U/L (ref 0–53)
AST: 27 U/L (ref 0–37)
Alkaline Phosphatase: 33 U/L — ABNORMAL LOW (ref 39–117)
Creat: 0.82 mg/dL (ref 0.50–1.35)
Total Bilirubin: 0.6 mg/dL (ref 0.3–1.2)

## 2011-02-23 ENCOUNTER — Ambulatory Visit (INDEPENDENT_AMBULATORY_CARE_PROVIDER_SITE_OTHER): Payer: BLUE CROSS/BLUE SHIELD | Admitting: Cardiology

## 2011-02-23 ENCOUNTER — Ambulatory Visit (HOSPITAL_COMMUNITY): Payer: Medicare Other | Attending: Cardiology | Admitting: Radiology

## 2011-02-23 ENCOUNTER — Encounter: Payer: Self-pay | Admitting: Cardiology

## 2011-02-23 DIAGNOSIS — E7800 Pure hypercholesterolemia, unspecified: Secondary | ICD-10-CM

## 2011-02-23 DIAGNOSIS — E785 Hyperlipidemia, unspecified: Secondary | ICD-10-CM | POA: Insufficient documentation

## 2011-02-23 DIAGNOSIS — E119 Type 2 diabetes mellitus without complications: Secondary | ICD-10-CM | POA: Insufficient documentation

## 2011-02-23 DIAGNOSIS — I10 Essential (primary) hypertension: Secondary | ICD-10-CM

## 2011-02-23 DIAGNOSIS — R011 Cardiac murmur, unspecified: Secondary | ICD-10-CM | POA: Insufficient documentation

## 2011-02-23 DIAGNOSIS — M75101 Unspecified rotator cuff tear or rupture of right shoulder, not specified as traumatic: Secondary | ICD-10-CM

## 2011-02-23 NOTE — Progress Notes (Signed)
HPI The patient presents for evaluation of a heart murmur.  He also is having right shoulder surgery and preoperative consultation is requested.  He has no past cardiac history. He has had some labile blood pressures though he reports typically his systolic runs between 110 and 161. His diastolics are well controlled. He has been noted over the years to have a heart murmur but he has not required any further evaluation. He has never had any chest pressure, neck or arm discomfort. The patient denies any new symptoms such as shortness of breath, PND or orthopnea. There have been no reported palpitations, presyncope or syncope.  He does some gardening but otherwise is very sedentary.   No Known Allergies  Current Outpatient Prescriptions  Medication Sig Dispense Refill  . Acetaminophen (TYLENOL ARTHRITIS PAIN PO) Take 2 tablets by mouth 2 (two) times daily.        . diphenhydrAMINE (SOMINEX) 25 MG tablet Take 25 mg by mouth at bedtime as needed.        . lovastatin (MEVACOR) 20 MG tablet Take 40 mg by mouth at bedtime.        . metoprolol (TOPROL-XL) 50 MG 24 hr tablet Take 75 mg by mouth daily. 1 at night and 1/2 in am       . Multiple Vitamins-Minerals (EYE VITAMINS PO) Take 1 tablet by mouth daily. Patient states vitamin is actually called "pressure vision," supplement for glaucoma.       . nystatin-triamcinolone (MYCOLOG) ointment Apply topically 2 (two) times daily.  30 g  0  . Tamsulosin HCl (FLOMAX) 0.4 MG CAPS Take 0.4 mg by mouth daily.        [name] aspirin 81 MG tablet Take 81 mg by mouth daily.          Past Medical History  Diagnosis Date  . Hypertension   . Hypercholesteremia   . Impaired fasting glucose   . Prostate cancer radiation + seeding implant (Dr.[name])  . Rotator cuff tear, right 10/2010    supraspinatous and infraspinatous    Past Surgical History  Procedure Date  . Tonsillectomy and adenoidectomy age 37  . Prostate seed implant     Family History  Problem Relation  Age of Onset  . Heart disease Father   . Stroke Sister     History   Social History  . Marital Status: Married    Spouse Name: N/A    Number of Children: N/A  . Years of Education: N/A   Occupational History  . Not on file.   Social History Main Topics  . Smoking status: Former Smoker    Quit date: 05/31/1980  . Smokeless tobacco: Not on file  . Alcohol Use: Yes     2-3 beers/night  . Drug Use: No  . Sexually Active: Not on file   Other Topics Concern  . Not on file   Social History Narrative  . No narrative on file    ROS:  Positive for urinary frequency, joint pain.  Otherwise as stated in the HPI and negative for all other systems.   PHYSICAL EXAM BP 180/92  Pulse 76  Ht 5\' 11"  (1.803 m)  Wt 173 lb 12.8 oz (78.835 kg)  BMI 24.24 kg/m2 GENERAL:  Well appearing HEENT:  Pupils equal round and reactive, fundi not visualized, oral mucosa unremarkable NECK:  No jugular venous distention, waveform within normal limits, carotid upstroke brisk and symmetric, no bruits, no thyromegaly LYMPHATICS:  No cervical, inguinal adenopathy LUNGS:  Clear to auscultation bilaterally BACK:  No CVA tenderness CHEST:  Unremarkable HEART:  PMI not displaced or sustained,S1 and S2 within normal limits, no S3, no S4, no clicks, no rubs, apical mid peaking systolic murmur radiating out the outflow tract. ABD:  Flat, positive bowel sounds normal in frequency in pitch, no bruits, no rebound, no guarding, no midline pulsatile mass, no hepatomegaly, no splenomegaly EXT:  2 plus pulses throughout, no edema, no cyanosis no clubbing SKIN:  No rashes no nodules NEURO:  Cranial nerves II through XII grossly intact, motor grossly intact throughout PSYCH:  Cognitively intact, oriented to person place and time   EKG: Sinus rhythm, rate 76, axis within normal limits, intervals within normal limits, no acute ST-T wave changes, PACs   ASSESSMENT AND PLAN

## 2011-02-23 NOTE — Patient Instructions (Signed)
Your physician has requested that you have an exercise tolerance test. For further information please visit [email or web address] Please also follow instruction sheet, as given. Friday  WITH DR [facility]  AFTER ECHO  DX MURMUR HTN Your physician has requested that you have an echocardiogram. Echocardiography is a painless test that uses sound waves to create images of your heart. It provides your doctor with information about the size and shape of your heart and how well your heart's chambers and valves are working. This procedure takes approximately one hour. There are no restrictions for this procedure. Friday GXT TO FOLLOW PER DR [facility] DX MURMUR Your physician recommends that you continue on your current medications as directed. Please refer to the Current Medication list given to you today.

## 2011-02-23 NOTE — Assessment & Plan Note (Signed)
I will bring the patient back for a POET (Plain Old Exercise Test). This will allow me to screen for obstructive coronary disease, risk stratify and very importantly provide a prescription for exercise.  This will also be a preoperative screen.

## 2011-02-23 NOTE — Assessment & Plan Note (Signed)
His blood pressure is elevated today but controlled at home.  I will ask him to continue to monitor this.  I will also assess his response to activity on an exercise treadmill.  I suspect he will need med titration.

## 2011-02-23 NOTE — Assessment & Plan Note (Signed)
I suspect mild aortic stenosis and I will get an echo to confirm.

## 2011-02-26 ENCOUNTER — Ambulatory Visit: Payer: BLUE CROSS/BLUE SHIELD | Admitting: Cardiology

## 2011-02-26 ENCOUNTER — Encounter (INDEPENDENT_AMBULATORY_CARE_PROVIDER_SITE_OTHER): Payer: BLUE CROSS/BLUE SHIELD

## 2011-02-26 ENCOUNTER — Ambulatory Visit (INDEPENDENT_AMBULATORY_CARE_PROVIDER_SITE_OTHER): Payer: BLUE CROSS/BLUE SHIELD | Admitting: Cardiology

## 2011-02-26 DIAGNOSIS — R0989 Other specified symptoms and signs involving the circulatory and respiratory systems: Secondary | ICD-10-CM

## 2011-02-26 DIAGNOSIS — I10 Essential (primary) hypertension: Secondary | ICD-10-CM

## 2011-02-26 DIAGNOSIS — R011 Cardiac murmur, unspecified: Secondary | ICD-10-CM

## 2011-02-26 NOTE — Progress Notes (Signed)
HPI The patient presented initially for an exercise treadmill test. I had seen him for preoperative clearance recently. An echocardiogram demonstrated mild aortic stenosis to explain his systolic murmur. Today I was going to perform an exercise treadmill test for preoperative screening. However, his blood pressure was 200/110 repeated several times. He is not describing any symptoms. The patient denies any new symptoms such as chest discomfort, neck or arm discomfort. There has been no new shortness of breath, PND or orthopnea. There have been no reported palpitations, presyncope or syncope.   No Known Allergies  Current Outpatient Prescriptions  Medication Sig Dispense Refill  . Acetaminophen (TYLENOL ARTHRITIS PAIN PO) Take 2 tablets by mouth 2 (two) times daily.        [name] aspirin 81 MG tablet Take 81 mg by mouth daily.        . diphenhydrAMINE (SOMINEX) 25 MG tablet Take 25 mg by mouth at bedtime as needed.        . lovastatin (MEVACOR) 20 MG tablet Take 40 mg by mouth at bedtime.        . metoprolol (TOPROL-XL) 50 MG 24 hr tablet Take 75 mg by mouth daily. 1 at night and 1/2 in am       . Multiple Vitamins-Minerals (EYE VITAMINS PO) Take 1 tablet by mouth daily. Patient states vitamin is actually called "pressure vision," supplement for glaucoma.       . nystatin-triamcinolone (MYCOLOG) ointment Apply topically 2 (two) times daily.  30 g  0  . Tamsulosin HCl (FLOMAX) 0.4 MG CAPS Take 0.4 mg by mouth daily.          Past Medical History  Diagnosis Date  . Hypertension   . Hypercholesteremia   . Impaired fasting glucose   . Prostate cancer radiation + seeding implant (Dr.[name])  . Rotator cuff tear, right 10/2010    supraspinatous and infraspinatous    Past Surgical History  Procedure Date  . Tonsillectomy and adenoidectomy age 78  . Prostate seed implant     ROS:  As stated in the HPI and negative for all other systems.  PHYSICAL EXAM There were no vitals taken for this  visit. (please see the vitals recorded above in the ETT) GENERAL:  Well appearing NECK:  No jugular venous distention, waveform within normal limits, carotid upstroke brisk and symmetric, no bruits, no thyromegaly LUNGS:  Clear to auscultation bilaterally HEART:  PMI not displaced or sustained,S1 and S2 within normal limits, no S3, no S4, no clicks, no rubs, no murmurs ABD:  Flat, positive bowel sounds normal in frequency in pitch, no bruits, no rebound, no guarding, no midline pulsatile mass, no hepatomegaly, no splenomegaly EXT:  2 plus pulses throughout, no edema, no cyanosis no clubbing   ASSESSMENT AND PLAN

## 2011-02-26 NOTE — Assessment & Plan Note (Signed)
Mild AS will be followed clinically.

## 2011-02-26 NOTE — Assessment & Plan Note (Signed)
Because of his severe HTN I suggested we postpone surgery until this can be further treated.  I did call and discuss with with Dr. Darene Lamer partner.

## 2011-02-26 NOTE — Assessment & Plan Note (Signed)
His blood pressure was very elevated. There is clearly a component of whitecoat hypertension. However, this is more dramatic than usual. He is leery about taking more medications. I will apply a 24-hour blood pressure monitor to further assess and help guide medical therapy.

## 2011-02-26 NOTE — Progress Notes (Deleted)
Exercise Treadmill Test  Pre-Exercise Testing Evaluation Rhythm: normal sinus  Rate: 83   PR:  .15 QRS:  .09  QT:  .40 QTc: .47     Test  Exercise Tolerance Test Ordering MD: Angelina Sheriff, MD  Interpreting MD:  Angelina Sheriff, MD  Unique Test No: 1  Treadmill:  {CHL TREADMILL # FOR VZD:63875643}  Indication for ETT: {CHL INDICATION FOR PIR:51884166}  Contraindication to ETT: {CHL CONTRAINDICATION TO AYT:01601093}   Stress Modality: {CHL STRESS MODALITY FOR ATF:57322025}  Cardiac Imaging Performed: {CHL CARDIAC IMAGING PERFORMED FOR KY[facility]:06237628}   Protocol: {CHL PROTOCOL FOR ETT:21021061}  Max BP:  ***/***  Max MPHR (bpm):  *** 85% MPR (bpm):  ***  MPHR obtained (bpm):  *** % MPHR obtained:  ***  Reached 85% MPHR (min:sec):  *** Total Exercise Time (min-sec):  ***  Workload in METS:  *** Borg Scale: ***  Reason ETT Terminated:  {CHL REASON TERMINATED FOR ETT:21021064}    ST Segment Analysis At Rest: {CHL ST SEGMENT AT REST FOR BTD:17616073} With Exercise: {CHL ST SEGMENT WITH EXERCISE FOR XTG:62694854}  Other Information Arrhythmia:  {CHL ARRHYTHMIA FOR OEV:03500938} Angina during ETT:  {CHL ANGINA DURING HWE:99371696} Quality of ETT:  {CHL QUALITY OF VEL:38101751}  ETT Interpretation:  {CHL INTERPRETATION FOR W[facility]:85277824}  Comments: ***  Recommendations: ***

## 2011-03-02 ENCOUNTER — Encounter: Payer: Self-pay | Admitting: Cardiology

## 2011-03-02 ENCOUNTER — Ambulatory Visit (INDEPENDENT_AMBULATORY_CARE_PROVIDER_SITE_OTHER): Payer: BLUE CROSS/BLUE SHIELD | Admitting: Cardiology

## 2011-03-02 VITALS — BP 180/100 | HR 76 | Resp 18 | Ht 71.0 in | Wt 176.8 lb

## 2011-03-02 DIAGNOSIS — I10 Essential (primary) hypertension: Secondary | ICD-10-CM

## 2011-03-02 DIAGNOSIS — R011 Cardiac murmur, unspecified: Secondary | ICD-10-CM

## 2011-03-02 MED ORDER — CAPTOPRIL 6.25 MG HALF TABLET: 6.2500 mg | ORAL_TABLET | Freq: Three times a day (TID) | ORAL | Status: DC

## 2011-03-02 NOTE — Assessment & Plan Note (Signed)
Greater than 1/2 hour with this appt convincing the patient and his wife that he has severe HTN.  He understands that he is a significant risk for stroke and other cardiovascular events.  He still is leery of low BPs.  I will start slowly and add captopril 6.25 tid.  He will continue the other meds.

## 2011-03-02 NOTE — Patient Instructions (Signed)
Please start Captopril 12.5 mg 1/2 tablet three times a day Continue all other medications as listed.  Follow up with Dr Antoine Poche in 2 weeks.

## 2011-03-02 NOTE — Progress Notes (Signed)
HPI The patient presents for evaluation of a HTN.  I saw him preoperatively. He also had significant hypertension. He was to have shoulder surgery. I was going to screen him with an exercise treadmill test. However, his blood pressure was extremely elevated and we canceled the surgery. He has worn a blood pressure 24 hour monitor that demonstrated very severe systolic hypertension even with sleep.  He reports that he feels well.  The patient denies any new symptoms such as chest discomfort, neck or arm discomfort. There has been no new shortness of breath, PND or orthopnea. There have been no reported palpitations, presyncope or syncope.   No Known Allergies  Current Outpatient Prescriptions  Medication Sig Dispense Refill  . Acetaminophen (TYLENOL ARTHRITIS PAIN PO) Take 2 tablets by mouth 2 (two) times daily.        . diphenhydrAMINE (SOMINEX) 25 MG tablet Take 25 mg by mouth at bedtime as needed.        . lovastatin (MEVACOR) 20 MG tablet Take 40 mg by mouth at bedtime.        . metoprolol (TOPROL-XL) 50 MG 24 hr tablet Take 50 mg by mouth 2 (two) times daily.       . Multiple Vitamins-Minerals (EYE VITAMINS PO) Take 1 tablet by mouth daily. Patient states vitamin is actually called "pressure vision," supplement for glaucoma.       . Tamsulosin HCl (FLOMAX) 0.4 MG CAPS Take 0.4 mg by mouth daily.          Past Medical History  Diagnosis Date  . Hypertension   . Hypercholesteremia   . Impaired fasting glucose   . Prostate cancer radiation + seeding implant (Dr.[name])  . Rotator cuff tear, right 10/2010    supraspinatous and infraspinatous    Past Surgical History  Procedure Date  . Tonsillectomy and adenoidectomy age 6  . Prostate seed implant     ROS:  Right shoulder pain. Otherwise as stated in the HPI and negative for all other systems.  PHYSICAL EXAM BP 180/100  Pulse 76  Resp 18  Ht 5\' 11"  (1.803 m)  Wt 176 lb 12.8 oz (80.196 kg)  BMI 24.66 kg/m2 GENERAL:  Well  appearing NECK:  No jugular venous distention, waveform within normal limits, carotid upstroke brisk and symmetric, no bruits, no thyromegaly LYMPHATICS:  No cervical, inguinal adenopathy LUNGS:  Clear to auscultation bilaterally BACK:  No CVA tenderness CHEST:  Unremarkable HEART:  PMI not displaced or sustained,S1 and S2 within normal limits, no S3, no S4, no clicks, no rubs, apical mid peaking systolic murmur radiating out the outflow tract. ABD:  Flat, positive bowel sounds normal in frequency in pitch, no bruits, no rebound, no guarding, no midline pulsatile mass, no hepatomegaly, no splenomegaly EXT:  2 plus pulses throughout, no edema, no cyanosis no clubbing NEURO:  Cranial nerves II through XII grossly intact, motor grossly intact throughout   ASSESSMENT AND PLAN

## 2011-03-02 NOTE — Assessment & Plan Note (Signed)
I reviewed his mild AS.  No therapy is indicated.

## 2011-03-03 ENCOUNTER — Ambulatory Visit (HOSPITAL_BASED_OUTPATIENT_CLINIC_OR_DEPARTMENT_OTHER)
Admission: RE | Admit: 2011-03-03 | Payer: BLUE CROSS/BLUE SHIELD | Source: Ambulatory Visit | Admitting: Orthopedic Surgery

## 2011-03-10 ENCOUNTER — Telehealth: Payer: Self-pay | Admitting: Cardiology

## 2011-03-10 NOTE — Telephone Encounter (Signed)
Walk In Pt Form " Pt has Questions concerning BP?" sent to Message Nurse  03/10/11/km

## 2011-03-17 ENCOUNTER — Ambulatory Visit (INDEPENDENT_AMBULATORY_CARE_PROVIDER_SITE_OTHER): Payer: BLUE CROSS/BLUE SHIELD | Admitting: Cardiology

## 2011-03-17 ENCOUNTER — Encounter: Payer: Self-pay | Admitting: Cardiology

## 2011-03-17 DIAGNOSIS — I10 Essential (primary) hypertension: Secondary | ICD-10-CM

## 2011-03-17 DIAGNOSIS — R011 Cardiac murmur, unspecified: Secondary | ICD-10-CM

## 2011-03-17 MED ORDER — LISINOPRIL 10 MG PO TABS: 10.0000 mg | ORAL_TABLET | Freq: Two times a day (BID) | ORAL | Status: DC

## 2011-03-17 NOTE — Patient Instructions (Signed)
Please start lisinopril 10 mg one twice a day Stop Captopril Continue all other medications as listed  Keep a record of your blood pressures.  You may turn them into the [place] office.  Follow up with Dr Antoine Poche in 2 weeks

## 2011-03-17 NOTE — Assessment & Plan Note (Signed)
He has mild AS on echo.  I will follow this clinically.

## 2011-03-17 NOTE — Progress Notes (Signed)
HPI The patient presents for evaluation of a HTN.  I saw him preoperatively. He also had significant hypertension. He was to have shoulder surgery. I was going to screen him with an exercise treadmill test. However, his blood pressure was extremely elevated and we canceled the surgery. He has worn a blood pressure 24 hour monitor that demonstrated very severe systolic hypertension even with sleep.  He reports that he feels well.    At the last visit I did add captopril.  However, he is taking this at odd intervals (6am, 12N and 8pm). He goes to sleep at 8 pm.  His blood pressure diary demonstrates that his BP is only controlled at the noon reading.  It has been 160 - 180 in the am and pm readings.  The patient denies any new symptoms such as chest discomfort, neck or arm discomfort. There has been no new shortness of breath, PND or orthopnea. There have been no reported palpitations, presyncope or syncope.  No Known Allergies  Current Outpatient Prescriptions  Medication Sig Dispense Refill  . Acetaminophen (TYLENOL ARTHRITIS PAIN PO) Take 2 tablets by mouth 2 (two) times daily.        . captopril (CAPOTEN) 6.25 mg TABS Take 0.5 tablets (6.25 mg total) by mouth 3 (three) times daily.  90 tablet  6  . diphenhydrAMINE (SOMINEX) 25 MG tablet Take 25 mg by mouth at bedtime as needed.        . lovastatin (MEVACOR) 20 MG tablet Take 40 mg by mouth at bedtime.        . metoprolol (TOPROL-XL) 50 MG 24 hr tablet Take 50 mg by mouth 2 (two) times daily.       . Multiple Vitamins-Minerals (EYE VITAMINS PO) Take 1 tablet by mouth daily. Patient states vitamin is actually called "pressure vision," supplement for glaucoma.       . Tamsulosin HCl (FLOMAX) 0.4 MG CAPS Take 0.4 mg by mouth daily.          Past Medical History  Diagnosis Date  . Hypertension   . Hypercholesteremia   . Impaired fasting glucose   . Prostate cancer radiation + seeding implant (Dr.[name])  . Rotator cuff tear, right 10/2010   supraspinatous and infraspinatous    Past Surgical History  Procedure Date  . Tonsillectomy and adenoidectomy age 44  . Prostate seed implant     ROS:  Right shoulder pain. Otherwise as stated in the HPI and negative for all other systems.  PHYSICAL EXAM BP 204/88  Pulse 88  Ht 5\' 11"  (1.803 m)  Wt 176 lb (79.833 kg)  BMI 24.55 kg/m2 GENERAL:  Well appearing NECK:  No jugular venous distention, waveform within normal limits, carotid upstroke brisk and symmetric, no bruits, no thyromegaly LUNGS:  Clear to auscultation bilaterally BACK:  No CVA tenderness CHEST:  Unremarkable HEART:  PMI not displaced or sustained,S1 and S2 within normal limits, no S3, no S4, no clicks, no rubs, apical mid peaking systolic murmur radiating out the outflow tract. ABD:  Flat, positive bowel sounds normal in frequency in pitch, no bruits, no rebound, no guarding, no midline pulsatile mass, no hepatomegaly, no splenomegaly EXT:  2 plus pulses throughout, no edema, no cyanosis no clubbing NEURO:  Cranial nerves II through XII grossly intact, motor grossly intact throughout   ASSESSMENT AND PLAN

## 2011-03-17 NOTE — Assessment & Plan Note (Signed)
His BP is still not controlled.  I will change him to lisinopril 10 mg bid.  He cannot take a tid drug.  He will keep his BP diary.

## 2011-03-24 ENCOUNTER — Telehealth: Payer: Self-pay | Admitting: Cardiology

## 2011-03-24 NOTE — Telephone Encounter (Signed)
Pt calling wanting to talk with nurse to see if it was ok with MD for pt to change appt to an earlier. Please return pt call to discuss further.

## 2011-03-24 NOTE — Telephone Encounter (Signed)
Marlowe Kays had called this patient due to a change in the MD's schedule.  Dr Antoine Poche is in the office on 03/25/11 and Marlowe Kays was trying to arrange earlier follow-up.  Marlowe Kays called the pt back and he would like to keep his appointment on 03/30/11.

## 2011-03-25 ENCOUNTER — Telehealth: Payer: Self-pay | Admitting: Cardiology

## 2011-03-25 NOTE — Telephone Encounter (Signed)
Pt's son in law was concerned about his BP readings  Please call him back

## 2011-03-25 NOTE — Telephone Encounter (Signed)
NA at number listed to call Brett Canales at.  Will attempt to contact again tomorrow.

## 2011-03-26 NOTE — Telephone Encounter (Signed)
Have been unable to reach Jeffrey [name] to discuss BP concerns.  Spoke with pt who states that his BP maybe a little better but is still running as high as 160-170 systolic.  He reports feeling fine.  Pt will continue medications as listed.  He has an appointment on Tuesday and will be reevaluated then.  Pt is in agreement.  He stated the Brett Canales is a pharmacist at [facility] and wants to discuss the pts medications with him.

## 2011-03-29 ENCOUNTER — Encounter: Payer: Self-pay | Admitting: Cardiology

## 2011-03-29 ENCOUNTER — Ambulatory Visit (INDEPENDENT_AMBULATORY_CARE_PROVIDER_SITE_OTHER): Payer: BLUE CROSS/BLUE SHIELD | Admitting: Cardiology

## 2011-03-29 VITALS — BP 162/81 | HR 67 | Ht 71.0 in | Wt 176.4 lb

## 2011-03-29 DIAGNOSIS — I10 Essential (primary) hypertension: Secondary | ICD-10-CM

## 2011-03-29 NOTE — Progress Notes (Signed)
HPI The patient presents for evaluation of a HTN. At the last visit I changed to lisinopril to try to have him on a longer acting drug.  He is doing OK with this but his blood pressure is still elevated at 7 am when he takes the am dose and 7 pm when he takes the pm dose.  At noon his SBP might be as low as 110.  He might get light headed with this.  He has had no chest pain, neck or arm pain. He does get some lightheadedness with standing if his BP is low.  However, he has no syncope.  No Known Allergies  Current Outpatient Prescriptions  Medication Sig Dispense Refill  . Acetaminophen (TYLENOL ARTHRITIS PAIN PO) Take 2 tablets by mouth 2 (two) times daily.        [name] lisinopril (PRINIVIL,ZESTRIL) 10 MG tablet Take 1 tablet (10 mg total) by mouth 2 (two) times daily.  60 tablet  11  . lovastatin (MEVACOR) 20 MG tablet Take 40 mg by mouth at bedtime.        . metoprolol (TOPROL-XL) 50 MG 24 hr tablet Take 50 mg by mouth 2 (two) times daily.       . Multiple Vitamins-Minerals (EYE VITAMINS PO) Take 1 tablet by mouth daily. Patient states vitamin is actually called "pressure vision," supplement for glaucoma.       . Tamsulosin HCl (FLOMAX) 0.4 MG CAPS Take 0.4 mg by mouth daily.          Past Medical History  Diagnosis Date  . Hypertension   . Hypercholesteremia   . Impaired fasting glucose   . Prostate cancer radiation + seeding implant (Dr.[name])  . Rotator cuff tear, right 10/2010    supraspinatous and infraspinatous    Past Surgical History  Procedure Date  . Tonsillectomy and adenoidectomy age 77  . Prostate seed implant     ROS:  Right shoulder pain. Otherwise as stated in the HPI and negative for all other systems.  PHYSICAL EXAM BP 162/81  Pulse 67  Ht 5\' 11"  (1.803 m)  Wt 176 lb 6.4 oz (80.015 kg)  BMI 24.60 kg/m2 GENERAL:  Well appearing NECK:  No jugular venous distention, waveform within normal limits, carotid upstroke brisk and symmetric, no bruits, no  thyromegaly LUNGS:  Clear to auscultation bilaterally BACK:  No CVA tenderness CHEST:  Unremarkable HEART:  PMI not displaced or sustained,S1 and S2 within normal limits, no S3, no S4, no clicks, no rubs, apical mid peaking systolic murmur radiating out the outflow tract. ABD:  Flat, positive bowel sounds normal in frequency in pitch, no bruits, no rebound, no guarding, no midline pulsatile mass, no hepatomegaly, no splenomegaly EXT:  2 plus pulses throughout, no edema, no cyanosis no clubbing NEURO:  Cranial nerves II through XII grossly intact, motor grossly intact throughout   ASSESSMENT AND PLAN

## 2011-03-29 NOTE — Patient Instructions (Signed)
Please take you medications as listed but at 10 am and 10 pm  Follow up in 2 weeks

## 2011-03-29 NOTE — Assessment & Plan Note (Signed)
I am going to move slowly on changes to his blood pressure. It is labile as reported above. I'm going to try to change him to a 10 AM and 10 PM dosing interval. If he has trough med levels in the early morning when he is asleep this is less likely to be associated with hypertensive spells. He will continue to keep his diary. Eventually I might need to switch him to t.i.d drugs which he said trouble taking or to long-acting medications such as a clonidine patch or Tekturna.

## 2011-03-30 ENCOUNTER — Ambulatory Visit: Payer: BLUE CROSS/BLUE SHIELD | Admitting: Cardiology

## 2011-04-14 ENCOUNTER — Ambulatory Visit (INDEPENDENT_AMBULATORY_CARE_PROVIDER_SITE_OTHER): Payer: Medicare Other | Admitting: Cardiology

## 2011-04-14 ENCOUNTER — Encounter: Payer: Self-pay | Admitting: Cardiology

## 2011-04-14 DIAGNOSIS — I10 Essential (primary) hypertension: Secondary | ICD-10-CM

## 2011-04-14 MED ORDER — METOPROLOL TARTRATE 25 MG PO TABS: 12.5000 mg | ORAL_TABLET | ORAL | Status: DC | PRN

## 2011-04-14 NOTE — Patient Instructions (Addendum)
Your physician has recommended you make the following change in your medication: Start Metoprolol 12.5 as needed for blood pressure. This Rx has been sent to your pharmacy  Your physician recommends that you schedule a follow-up appointment in:  4 weeks with Dr. Antoine Poche

## 2011-04-14 NOTE — Assessment & Plan Note (Signed)
Today I will make no change to his meds.  I reviewed the BP diary.  I will have him restrict his salt more than he is doing and increased his exercise. I will give him a short acting low does metoprolol as needed for any sustained elevated readings.  I will see him back in four weeks.

## 2011-04-14 NOTE — Progress Notes (Signed)
HPI The patient presents for evaluation of a HTN. At the last visit I changed the timing of his ACE inhibitor to try to eliminate am HTN.  He brings me a BP diary that has somewhat better control.  However, he still has some highs in the AM.  He has a few lows usually at noon.  Overall he is feeling OK.  The patient denies any new symptoms such as chest discomfort, neck or arm discomfort. There has been no new shortness of breath, PND or orthopnea. There have been no reported palpitations, presyncope or syncope.  He does note that his BP goes up if he drives a long distance and down after he exercises.   No Known Allergies  Current Outpatient Prescriptions  Medication Sig Dispense Refill  . Acetaminophen (TYLENOL ARTHRITIS PAIN PO) Take 2 tablets by mouth 2 (two) times daily.        [name] lisinopril (PRINIVIL,ZESTRIL) 10 MG tablet Take 1 tablet (10 mg total) by mouth 2 (two) times daily.  60 tablet  11  . lovastatin (MEVACOR) 20 MG tablet Take 40 mg by mouth at bedtime.        . metoprolol (TOPROL-XL) 50 MG 24 hr tablet Take 50 mg by mouth 2 (two) times daily.       . Multiple Vitamins-Minerals (EYE VITAMINS PO) Take 1 tablet by mouth daily. Patient states vitamin is actually called "pressure vision," supplement for glaucoma.       . Tamsulosin HCl (FLOMAX) 0.4 MG CAPS Take 0.4 mg by mouth daily.          Past Medical History  Diagnosis Date  . Hypertension   . Hypercholesteremia   . Impaired fasting glucose   . Prostate cancer radiation + seeding implant (Dr.[name])  . Rotator cuff tear, right 10/2010    supraspinatous and infraspinatous    Past Surgical History  Procedure Date  . Tonsillectomy and adenoidectomy age 30  . Prostate seed implant     ROS:  Right shoulder pain. Otherwise as stated in the HPI and negative for all other systems.  PHYSICAL EXAM BP 175/80  Pulse 82  Resp 16  Ht 5\' 11"  (1.803 m)  Wt 79.379 kg (175 lb)  BMI 24.41 kg/m2 GENERAL:  Well appearing NECK:  No  jugular venous distention, waveform within normal limits, carotid upstroke brisk and symmetric, no bruits, no thyromegaly LUNGS:  Clear to auscultation bilaterally BACK:  No CVA tenderness CHEST:  Unremarkable HEART:  PMI not displaced or sustained,S1 and S2 within normal limits, no S3, no S4, no clicks, no rubs, apical mid peaking systolic murmur radiating out the outflow tract. ABD:  Flat, positive bowel sounds normal in frequency in pitch, no bruits, no rebound, no guarding, no midline pulsatile mass, no hepatomegaly, no splenomegaly EXT:  2 plus pulses throughout, no edema, no cyanosis no clubbing NEURO:  Cranial nerves II through XII grossly intact, motor grossly intact throughout   ASSESSMENT AND PLAN

## 2011-04-20 ENCOUNTER — Ambulatory Visit: Payer: Medicare Other | Admitting: Cardiology

## 2011-05-18 ENCOUNTER — Encounter: Payer: Self-pay | Admitting: Cardiology

## 2011-05-18 ENCOUNTER — Ambulatory Visit (INDEPENDENT_AMBULATORY_CARE_PROVIDER_SITE_OTHER): Payer: Medicare Other | Admitting: Cardiology

## 2011-05-18 DIAGNOSIS — M75101 Unspecified rotator cuff tear or rupture of right shoulder, not specified as traumatic: Secondary | ICD-10-CM

## 2011-05-18 DIAGNOSIS — I10 Essential (primary) hypertension: Secondary | ICD-10-CM

## 2011-05-18 NOTE — Assessment & Plan Note (Signed)
His BP is much better.  He will continue the meds as listed.  He does have significant white coat HTN.

## 2011-05-18 NOTE — Patient Instructions (Signed)
The current medical regimen is effective;  continue present plan and medications.  Follow up in 3 months with Dr Hochrein. 

## 2011-05-18 NOTE — Assessment & Plan Note (Signed)
At this point he is active with a high functional level.  There are no high risk findings now that the BP is controlled. Therefore, based on ACC/AHA guidelines, the patient would be at acceptable risk for the planned procedure without further cardiovascular testing.

## 2011-05-18 NOTE — Progress Notes (Signed)
   HPI The patient presents for evaluation of a HTN. Since I last saw him his blood pressures are much better.  He brings a diary which I reviewed.  He is able to rake leaves.  The patient denies any new symptoms such as chest discomfort, neck or arm discomfort. There has been no new shortness of breath, PND or orthopnea. There have been no reported palpitations, presyncope or syncope.   No Known Allergies  Current Outpatient Prescriptions  Medication Sig Dispense Refill  . Acetaminophen (TYLENOL ARTHRITIS PAIN PO) Take 2 tablets by mouth 2 (two) times daily.        [name] lisinopril (PRINIVIL,ZESTRIL) 10 MG tablet Take 1 tablet (10 mg total) by mouth 2 (two) times daily.  60 tablet  11  . lovastatin (MEVACOR) 20 MG tablet Take 40 mg by mouth at bedtime.        . metoprolol (TOPROL-XL) 50 MG 24 hr tablet Take 50 mg by mouth 2 (two) times daily.       . metoprolol tartrate (LOPRESSOR) 25 MG tablet Take 0.5 tablets (12.5 mg total) by mouth as needed.  30 tablet  6  . Multiple Vitamins-Minerals (EYE VITAMINS PO) Take 1 tablet by mouth daily. Patient states vitamin is actually called "pressure vision," supplement for glaucoma.       . Tamsulosin HCl (FLOMAX) 0.4 MG CAPS Take 0.4 mg by mouth daily.          Past Medical History  Diagnosis Date  . Hypertension   . Hypercholesteremia   . Impaired fasting glucose   . Prostate cancer radiation + seeding implant (Dr.[name])  . Rotator cuff tear, right 10/2010    supraspinatous and infraspinatous    Past Surgical History  Procedure Date  . Tonsillectomy and adenoidectomy age 44  . Prostate seed implant     ROS:  Right shoulder pain. Otherwise as stated in the HPI and negative for all other systems.  PHYSICAL EXAM BP 170/88  Pulse 72  Ht 5\' 11"  (1.803 m)  Wt 175 lb (79.379 kg)  BMI 24.41 kg/m2 GENERAL:  Well appearing NECK:  No jugular venous distention, waveform within normal limits, carotid upstroke brisk and symmetric, no bruits, no  thyromegaly LUNGS:  Clear to auscultation bilaterally BACK:  No CVA tenderness CHEST:  Unremarkable HEART:  PMI not displaced or sustained,S1 and S2 within normal limits, no S3, no S4, no clicks, no rubs, apical mid peaking systolic murmur radiating out the outflow tract. ABD:  Flat, positive bowel sounds normal in frequency in pitch, no bruits, no rebound, no guarding, no midline pulsatile mass, no hepatomegaly, no splenomegaly EXT:  2 plus pulses throughout, no edema, no cyanosis no clubbing NEURO:  Cranial nerves II through XII grossly intact, motor grossly intact throughout   ASSESSMENT AND PLAN

## 2011-05-20 DIAGNOSIS — H2513 Age-related nuclear cataract, bilateral: Secondary | ICD-10-CM | POA: Insufficient documentation

## 2011-05-31 ENCOUNTER — Other Ambulatory Visit: Payer: Self-pay | Admitting: Family Medicine

## 2011-05-31 DIAGNOSIS — I10 Essential (primary) hypertension: Secondary | ICD-10-CM

## 2011-05-31 MED ORDER — METOPROLOL SUCCINATE ER 50 MG PO TB24: 50.0000 mg | ORAL_TABLET | Freq: Two times a day (BID) | ORAL | Status: DC

## 2011-05-31 NOTE — Telephone Encounter (Signed)
Jeffrey [name] with patient and he does see cardiology. Jeffrey [name] was prescribing metoprolol XL 50mg  BID, he saw Jeffrey [name] and he added lisinopril and metoprolol tartrate 25mg - 12.5mg  to be taken as needed for high blood pressure readings. Patient is to continue on metoprolol XL 50mg  BID in addition to the lisinopril and the "PRN" metoprolol tartrate. He was given the rx for the tartrate by cards but needs the XL called in by [place]. Is this ok?

## 2011-05-31 NOTE — Telephone Encounter (Signed)
Chart shows that he sees cardiology, and chart shows 2 different metoprolol doses.   Thus, if cardiology refills this, then he needs to call them. If not, then I need clarification on what he is taking and what needs refilling?

## 2011-05-31 NOTE — Telephone Encounter (Signed)
Patient needs refill Toprol  [facility] patient but I do not see request in system. He is out of meds

## 2011-06-15 ENCOUNTER — Telehealth: Payer: Self-pay | Admitting: Cardiology

## 2011-06-15 NOTE — Telephone Encounter (Signed)
12,Echo,LOv faxed to [facility] @ [phone] 06/15/11/KM

## 2011-06-16 DIAGNOSIS — M67919 Unspecified disorder of synovium and tendon, unspecified shoulder: Secondary | ICD-10-CM | POA: Diagnosis not present

## 2011-06-16 DIAGNOSIS — S43429A Sprain of unspecified rotator cuff capsule, initial encounter: Secondary | ICD-10-CM | POA: Diagnosis not present

## 2011-06-16 DIAGNOSIS — M719 Bursopathy, unspecified: Secondary | ICD-10-CM | POA: Diagnosis not present

## 2011-06-16 DIAGNOSIS — M19019 Primary osteoarthritis, unspecified shoulder: Secondary | ICD-10-CM | POA: Diagnosis not present

## 2011-06-16 DIAGNOSIS — M19219 Secondary osteoarthritis, unspecified shoulder: Secondary | ICD-10-CM | POA: Diagnosis not present

## 2011-06-16 DIAGNOSIS — M948X9 Other specified disorders of cartilage, unspecified sites: Secondary | ICD-10-CM | POA: Diagnosis not present

## 2011-06-16 HISTORY — PX: ROTATOR CUFF REPAIR: SHX139

## 2011-06-21 ENCOUNTER — Encounter: Payer: Self-pay | Admitting: Orthopedic Surgery

## 2011-06-21 DIAGNOSIS — IMO0001 Reserved for inherently not codable concepts without codable children: Secondary | ICD-10-CM | POA: Diagnosis not present

## 2011-06-21 DIAGNOSIS — M25619 Stiffness of unspecified shoulder, not elsewhere classified: Secondary | ICD-10-CM | POA: Diagnosis not present

## 2011-06-21 DIAGNOSIS — M25519 Pain in unspecified shoulder: Secondary | ICD-10-CM | POA: Diagnosis not present

## 2011-06-22 DIAGNOSIS — M19019 Primary osteoarthritis, unspecified shoulder: Secondary | ICD-10-CM | POA: Diagnosis not present

## 2011-06-23 DIAGNOSIS — IMO0001 Reserved for inherently not codable concepts without codable children: Secondary | ICD-10-CM | POA: Diagnosis not present

## 2011-06-23 DIAGNOSIS — M25519 Pain in unspecified shoulder: Secondary | ICD-10-CM | POA: Diagnosis not present

## 2011-06-23 DIAGNOSIS — M25619 Stiffness of unspecified shoulder, not elsewhere classified: Secondary | ICD-10-CM | POA: Diagnosis not present

## 2011-06-28 DIAGNOSIS — M25519 Pain in unspecified shoulder: Secondary | ICD-10-CM | POA: Diagnosis not present

## 2011-06-28 DIAGNOSIS — IMO0001 Reserved for inherently not codable concepts without codable children: Secondary | ICD-10-CM | POA: Diagnosis not present

## 2011-06-28 DIAGNOSIS — M25619 Stiffness of unspecified shoulder, not elsewhere classified: Secondary | ICD-10-CM | POA: Diagnosis not present

## 2011-06-30 DIAGNOSIS — M25619 Stiffness of unspecified shoulder, not elsewhere classified: Secondary | ICD-10-CM | POA: Diagnosis not present

## 2011-06-30 DIAGNOSIS — IMO0001 Reserved for inherently not codable concepts without codable children: Secondary | ICD-10-CM | POA: Diagnosis not present

## 2011-06-30 DIAGNOSIS — M25519 Pain in unspecified shoulder: Secondary | ICD-10-CM | POA: Diagnosis not present

## 2011-07-01 DIAGNOSIS — M25519 Pain in unspecified shoulder: Secondary | ICD-10-CM | POA: Diagnosis not present

## 2011-07-02 ENCOUNTER — Encounter: Payer: Self-pay | Admitting: Orthopedic Surgery

## 2011-07-02 DIAGNOSIS — M25519 Pain in unspecified shoulder: Secondary | ICD-10-CM | POA: Diagnosis not present

## 2011-07-02 DIAGNOSIS — M25619 Stiffness of unspecified shoulder, not elsewhere classified: Secondary | ICD-10-CM | POA: Diagnosis not present

## 2011-07-02 DIAGNOSIS — IMO0001 Reserved for inherently not codable concepts without codable children: Secondary | ICD-10-CM | POA: Diagnosis not present

## 2011-07-22 DIAGNOSIS — M19019 Primary osteoarthritis, unspecified shoulder: Secondary | ICD-10-CM | POA: Diagnosis not present

## 2011-07-30 ENCOUNTER — Encounter: Payer: Self-pay | Admitting: Orthopedic Surgery

## 2011-07-30 DIAGNOSIS — M25519 Pain in unspecified shoulder: Secondary | ICD-10-CM | POA: Diagnosis not present

## 2011-07-30 DIAGNOSIS — M25619 Stiffness of unspecified shoulder, not elsewhere classified: Secondary | ICD-10-CM | POA: Diagnosis not present

## 2011-07-30 DIAGNOSIS — IMO0001 Reserved for inherently not codable concepts without codable children: Secondary | ICD-10-CM | POA: Diagnosis not present

## 2011-08-02 ENCOUNTER — Encounter: Payer: Self-pay | Admitting: Family Medicine

## 2011-08-02 ENCOUNTER — Ambulatory Visit (INDEPENDENT_AMBULATORY_CARE_PROVIDER_SITE_OTHER): Payer: Medicare Other | Admitting: Family Medicine

## 2011-08-02 DIAGNOSIS — I10 Essential (primary) hypertension: Secondary | ICD-10-CM | POA: Diagnosis not present

## 2011-08-02 DIAGNOSIS — E7800 Pure hypercholesterolemia, unspecified: Secondary | ICD-10-CM | POA: Diagnosis not present

## 2011-08-02 DIAGNOSIS — E119 Type 2 diabetes mellitus without complications: Secondary | ICD-10-CM | POA: Diagnosis not present

## 2011-08-02 MED ORDER — LOVASTATIN 20 MG PO TABS: 40.0000 mg | ORAL_TABLET | Freq: Every day | ORAL | Status: DC

## 2011-08-02 NOTE — Progress Notes (Signed)
Patient presents for med check.  Hypertension follow-up:  Blood pressures elsewhere are 130-140's/70-79, sometimes as high as 169 systolic (when it was checked just prior to coming).  No longer having 200's at home.  Denies dizziness, headaches, chest pain.  Denies side effects of medications.  He had seen Dr. Antoine Poche for cardiac clearance prior to his shoulder surgery, and he adjusted his BP meds. Echo showed mild AS as cause for his murmur. He reduced the salt in his diet, reduced his alcohol intake (to 2 drinks daily), and was walking 2 miles twice daily prior to his shoulder surgery.  Getting exercise at physical therapy.  Hyperlipidemia follow-up:  Patient is reportedly following a low-fat, low cholesterol diet.  Compliant with medications until he ran out 10 days ago.  He was told by the pharmacy that we denied his refill (no request in system, likely went to [place]).  Has had no further problems with the bumps/rash over a month ago, or other side effects  Impaired fasting glucose/pre-diabetes/DM--Last A1c 6.2, but has had glu>126 x 2.  Due for A1c today.  Nonfasting today.  Rotator cuff tear on right--had it repaired 1/16, and is getting PT, doing well  Past Medical History  Diagnosis Date  . Hypertension   . Hypercholesteremia   . Impaired fasting glucose   . Prostate cancer radiation + seeding implant (Dr.[name])  . Rotator cuff tear, right 10/2010    supraspinatous and infraspinatous    Past Surgical History  Procedure Date  . Tonsillectomy and adenoidectomy age 74  . Prostate seed implant     History   Social History  . Marital Status: Married    Spouse Name: N/A    Number of Children: 4  . Years of Education: N/A   Occupational History  . Retired    Social History Main Topics  . Smoking status: Former Smoker -- 1.0 packs/day for 20 years    Types: Cigarettes    Quit date: 05/31/1980  . Smokeless tobacco: Not on file  . Alcohol Use: Yes     2-3 beers/night  . Drug  Use: No  . Sexually Active: Not on file   Other Topics Concern  . Not on file   Social History Narrative  . No narrative on file    Family History  Problem Relation Age of Onset  . Heart disease Father 20    Died suddenly.  No diagnosis  . Stroke Sister    Current Outpatient Prescriptions on File Prior to Visit  Medication Sig Dispense Refill  . Acetaminophen (TYLENOL ARTHRITIS PAIN PO) Take 2 tablets by mouth 2 (two) times daily.        [name] lisinopril (PRINIVIL,ZESTRIL) 10 MG tablet Take 1 tablet (10 mg total) by mouth 2 (two) times daily.  60 tablet  11  . lovastatin (MEVACOR) 20 MG tablet Take 40 mg by mouth at bedtime.        . metoprolol (TOPROL-XL) 50 MG 24 hr tablet Take 1 tablet (50 mg total) by mouth 2 (two) times daily.  180 tablet  1  . Multiple Vitamins-Minerals (EYE VITAMINS PO) Take 1 tablet by mouth daily. Patient states vitamin is actually called "pressure vision," supplement for glaucoma.       . Tamsulosin HCl (FLOMAX) 0.4 MG CAPS Take 0.4 mg by mouth daily.          No Known Allergies  ROS:  Denies headaches, dizziness, chest pain, palpitations, fevers, URI symptoms, shortness of breath, edema, GI  complaints, denies myalgias, skin rashes or other concerns.  PHYSICAL EXAM: BP 182/96  Pulse 64  Ht 5\' 11"  (1.803 m)  Wt 175 lb (79.379 kg)  BMI 24.41 kg/m2 180/84 Well developed, mildly anxious appearing male in no distress Neck: no lymphadenopathy or thyromegaly Heart: regular rate and rhythm, 2/6 SEM, loudest at RUSB, with frequent ectopy/extra beats Lungs: clear bilaterally Abdomen: soft, nontender, no organomegaly or mass Skin: no rash Extremities: no edema, 2+ pulse  ASSESSMENT/PLAN: 1. Type II or unspecified type diabetes mellitus without mention of complication, not stated as uncontrolled  Glucose, random, HgB A1c  2. Pure hypercholesterolemia  lovastatin (MEVACOR) 20 MG tablet, Lipid panel, Hepatic function panel  3. Essential hypertension, benign       HTN--with significant white coat component. Improved numbers at home. Continue current med regimen  DM--A1c today. Likely improved with changes in diet, exercise  Hyperlipidemia--since out of meds x 10 days, will have him return for fasting labs in 2 months.  Goal LDL<100  Lipid, lft;'s, glu in 2 months  Send letter with A1c results

## 2011-08-02 NOTE — Patient Instructions (Signed)
Continue all of your current medications.  Restart the lovastatin.  If you ever encounter an issue with the pharmacy denying your meds, please always verify that with our office by calling here directly.  Keep up trying to exercise daily, lose weight, and low sodium diet.  Continue monitoring BP at home

## 2011-08-17 ENCOUNTER — Encounter: Payer: Self-pay | Admitting: Cardiology

## 2011-08-17 ENCOUNTER — Ambulatory Visit (INDEPENDENT_AMBULATORY_CARE_PROVIDER_SITE_OTHER): Payer: Medicare Other | Admitting: Cardiology

## 2011-08-17 VITALS — BP 160/90 | HR 65 | Ht 71.0 in | Wt 171.0 lb

## 2011-08-17 DIAGNOSIS — I10 Essential (primary) hypertension: Secondary | ICD-10-CM

## 2011-08-17 NOTE — Progress Notes (Signed)
   HPI The patient presents for follow up of his HTN.  Since I last saw him he did have a shoulder surgery and did well with this. His blood pressure has been much lower with systolics in the 130 to 140 range and diastolics in the 80s. He did have an episode of syncope this weekend. He got out in the yard and was working for the first time in a while. He was overheated. He felt lightheaded and sat down. When he stood up he had frank syncope.  His BP was systolic 80 when he got into the house.  He is otherwise not reporting orthostatic symptoms. He has not otherwise had blood pressures this low. He's had no chest pain or palpitations. He's had no shortness of breath, PND or orthopnea.   No Known Allergies  Current Outpatient Prescriptions  Medication Sig Dispense Refill  . Acetaminophen (TYLENOL ARTHRITIS PAIN PO) Take 2 tablets by mouth 2 (two) times daily.        [name] lisinopril (PRINIVIL,ZESTRIL) 10 MG tablet Take 1 tablet (10 mg total) by mouth 2 (two) times daily.  60 tablet  11  . lovastatin (MEVACOR) 20 MG tablet Take 2 tablets (40 mg total) by mouth at bedtime.  180 tablet  0  . metoprolol (TOPROL-XL) 50 MG 24 hr tablet Take 1 tablet (50 mg total) by mouth 2 (two) times daily.  180 tablet  1  . Multiple Vitamins-Minerals (EYE VITAMINS PO) Take 1 tablet by mouth daily. Patient states vitamin is actually called "pressure vision," supplement for glaucoma.       . Tamsulosin HCl (FLOMAX) 0.4 MG CAPS Take 0.4 mg by mouth daily.          Past Medical History  Diagnosis Date  . Hypertension   . Hypercholesteremia   . Impaired fasting glucose   . Prostate cancer radiation + seeding implant (Dr.[name])  . Rotator cuff tear, right 10/2010    supraspinatous and infraspinatous    Past Surgical History  Procedure Date  . Tonsillectomy and adenoidectomy age 24  . Prostate seed implant   . Rotator cuff repair 06/16/2011    right (Dr. Luiz Blare)    ROS:  As stated in the HPI and negative for all other  systems.  PHYSICAL EXAM BP 160/90  Pulse 65  Ht 5\' 11"  (1.803 m)  Wt 171 lb (77.565 kg)  BMI 23.85 kg/m2 GENERAL:  Well appearing NECK:  No jugular venous distention, waveform within normal limits, carotid upstroke brisk and symmetric, no bruits, no thyromegaly LUNGS:  Clear to auscultation bilaterally BACK:  No CVA tenderness CHEST:  Unremarkable HEART:  PMI not displaced or sustained,S1 and S2 within normal limits, no S3, no S4, no clicks, no rubs, apical mid peaking systolic murmur radiating out the outflow tract. ABD:  Flat, positive bowel sounds normal in frequency in pitch, no bruits, no rebound, no guarding, no midline pulsatile mass, no hepatomegaly, no splenomegaly EXT:  2 plus pulses throughout, no edema, no cyanosis no clubbing NEURO:  Cranial nerves II through XII grossly intact, motor grossly intact throughout  EKG:  Sinus rhythm, rate 65, axis within normal limits, intervals within normal limits, no acute ST-T wave changes.   ASSESSMENT AND PLAN

## 2011-08-17 NOTE — Assessment & Plan Note (Signed)
At this point I think his blood pressure is well controlled. He certainly knows if he has further orthostasis he needs to call me. If his blood pressure starts trending down he needs to let us know and I would reduce his medications. We discussed measures to avoid further orthostatic syncope.

## 2011-08-17 NOTE — Patient Instructions (Signed)
Your physician wants you to follow-up in: 6 months with Dr. Hochrein.  You will receive a reminder letter in the mail two months in advance. If you don't receive a letter, please call our office to schedule the follow-up appointment.  

## 2011-08-17 NOTE — Assessment & Plan Note (Signed)
He has mild aortic stenosis. I will follow this clinically and with repeat echo is as indicated.

## 2011-08-23 DIAGNOSIS — M19019 Primary osteoarthritis, unspecified shoulder: Secondary | ICD-10-CM | POA: Diagnosis not present

## 2011-08-30 ENCOUNTER — Encounter: Payer: Self-pay | Admitting: Orthopedic Surgery

## 2011-08-30 DIAGNOSIS — M25619 Stiffness of unspecified shoulder, not elsewhere classified: Secondary | ICD-10-CM | POA: Diagnosis not present

## 2011-08-30 DIAGNOSIS — M25519 Pain in unspecified shoulder: Secondary | ICD-10-CM | POA: Diagnosis not present

## 2011-08-30 DIAGNOSIS — IMO0001 Reserved for inherently not codable concepts without codable children: Secondary | ICD-10-CM | POA: Diagnosis not present

## 2011-09-20 DIAGNOSIS — C61 Malignant neoplasm of prostate: Secondary | ICD-10-CM | POA: Diagnosis not present

## 2011-09-23 ENCOUNTER — Other Ambulatory Visit: Payer: Medicare Other

## 2011-09-23 DIAGNOSIS — E7800 Pure hypercholesterolemia, unspecified: Secondary | ICD-10-CM

## 2011-09-23 DIAGNOSIS — M19019 Primary osteoarthritis, unspecified shoulder: Secondary | ICD-10-CM | POA: Diagnosis not present

## 2011-09-23 DIAGNOSIS — E119 Type 2 diabetes mellitus without complications: Secondary | ICD-10-CM | POA: Diagnosis not present

## 2011-09-23 LAB — LIPID PANEL
Cholesterol: 161 mg/dL (ref 0–200)
Total CHOL/HDL Ratio: 2.9 Ratio
Triglycerides: 118 mg/dL (ref <150)
VLDL: 24 mg/dL (ref 0–40)

## 2011-09-23 LAB — HEPATIC FUNCTION PANEL
ALT: 19 U/L (ref 0–53)
AST: 22 U/L (ref 0–37)
Alkaline Phosphatase: 36 U/L — ABNORMAL LOW (ref 39–117)
Bilirubin, Direct: 0.1 mg/dL (ref 0.0–0.3)
Indirect Bilirubin: 0.4 mg/dL (ref 0.0–0.9)
Total Bilirubin: 0.5 mg/dL (ref 0.3–1.2)

## 2011-09-23 LAB — GLUCOSE, RANDOM: Glucose, Bld: 137 mg/dL — ABNORMAL HIGH (ref 70–99)

## 2011-09-28 ENCOUNTER — Telehealth: Payer: Self-pay | Admitting: Family Medicine

## 2011-09-29 ENCOUNTER — Encounter: Payer: Self-pay | Admitting: Orthopedic Surgery

## 2011-09-29 ENCOUNTER — Telehealth: Payer: Self-pay | Admitting: *Deleted

## 2011-09-29 NOTE — Telephone Encounter (Signed)
Left message for patient to return my call.

## 2011-09-30 NOTE — Telephone Encounter (Signed)
LM

## 2011-10-01 ENCOUNTER — Other Ambulatory Visit: Payer: Medicare Other

## 2011-10-04 ENCOUNTER — Telehealth: Payer: Self-pay | Admitting: *Deleted

## 2011-10-04 NOTE — Telephone Encounter (Signed)
Spoke with patient's wife and I am going to send a referral over to [facility]. They will call pt and schedule.

## 2011-10-27 ENCOUNTER — Encounter: Payer: Medicare Other | Attending: Family Medicine | Admitting: Dietician

## 2011-10-27 VITALS — Ht 71.0 in | Wt 170.4 lb

## 2011-10-27 DIAGNOSIS — E119 Type 2 diabetes mellitus without complications: Secondary | ICD-10-CM | POA: Insufficient documentation

## 2011-10-27 DIAGNOSIS — I10 Essential (primary) hypertension: Secondary | ICD-10-CM

## 2011-10-27 DIAGNOSIS — E7800 Pure hypercholesterolemia, unspecified: Secondary | ICD-10-CM | POA: Insufficient documentation

## 2011-10-27 DIAGNOSIS — Z713 Dietary counseling and surveillance: Secondary | ICD-10-CM | POA: Diagnosis not present

## 2011-10-28 ENCOUNTER — Encounter: Payer: Self-pay | Admitting: Dietician

## 2011-10-28 NOTE — Progress Notes (Signed)
  Patient was seen on 10/27/2011 for the first of a series of three diabetes self-management courses at [facility]. The following learning objectives were met by the patient during this course:   Defines the role of glucose and insulin  Identifies type of diabetes and pathophysiology  Defines the diagnostic criteria for diabetes and prediabetes  States the risk factors for Type 2 Diabetes  States the symptoms of Type 2 Diabetes  Defines Type 2 Diabetes treatment goals  Defines Type 2 Diabetes treatment options  States the rationale for glucose monitoring  Identifies A1C, glucose targets, and testing times  Identifies proper sharps disposal  Defines the purpose of a diabetes food plan  Identifies carbohydrate food groups  Defines effects of carbohydrate foods on glucose levels  Identifies carbohydrate choices/grams/food labels  States benefits of physical activity and effect on glucose  Review of suggested activity guidelines  Handouts given during class include:  Type 2 Diabetes: Basics Book  My Food Plan Book  Food and Activity Log   HgA1C:5.7%  History of fasting glucose 126 for 2 times.  Patient has established the following initial goals:  Increase exercise  Follow-Up Plan: Attend the Diabetes Self-Management Core Classes Two and Three

## 2011-11-01 ENCOUNTER — Other Ambulatory Visit: Payer: Self-pay | Admitting: Family Medicine

## 2011-11-04 DIAGNOSIS — H2510 Age-related nuclear cataract, unspecified eye: Secondary | ICD-10-CM | POA: Diagnosis not present

## 2011-11-04 DIAGNOSIS — H353190 Nonexudative age-related macular degeneration, unspecified eye, stage unspecified: Secondary | ICD-10-CM | POA: Diagnosis not present

## 2011-11-08 DIAGNOSIS — M19019 Primary osteoarthritis, unspecified shoulder: Secondary | ICD-10-CM | POA: Diagnosis not present

## 2011-11-09 ENCOUNTER — Ambulatory Visit: Payer: Medicare Other

## 2011-11-11 ENCOUNTER — Encounter: Payer: Medicare Other | Attending: Family Medicine

## 2011-11-11 DIAGNOSIS — E119 Type 2 diabetes mellitus without complications: Secondary | ICD-10-CM | POA: Insufficient documentation

## 2011-11-11 DIAGNOSIS — I10 Essential (primary) hypertension: Secondary | ICD-10-CM | POA: Insufficient documentation

## 2011-11-11 DIAGNOSIS — E7800 Pure hypercholesterolemia, unspecified: Secondary | ICD-10-CM | POA: Insufficient documentation

## 2011-11-11 DIAGNOSIS — Z713 Dietary counseling and surveillance: Secondary | ICD-10-CM | POA: Insufficient documentation

## 2011-11-16 ENCOUNTER — Ambulatory Visit: Payer: Medicare Other

## 2011-11-18 ENCOUNTER — Ambulatory Visit: Payer: Medicare Other

## 2011-11-28 ENCOUNTER — Other Ambulatory Visit: Payer: Self-pay | Admitting: Family Medicine

## 2012-02-26 ENCOUNTER — Other Ambulatory Visit: Payer: Self-pay | Admitting: Family Medicine

## 2012-03-16 ENCOUNTER — Other Ambulatory Visit: Payer: Self-pay | Admitting: Family Medicine

## 2012-03-18 DIAGNOSIS — Z23 Encounter for immunization: Secondary | ICD-10-CM | POA: Diagnosis not present

## 2012-04-10 DIAGNOSIS — C61 Malignant neoplasm of prostate: Secondary | ICD-10-CM | POA: Diagnosis not present

## 2012-05-25 DIAGNOSIS — H353190 Nonexudative age-related macular degeneration, unspecified eye, stage unspecified: Secondary | ICD-10-CM | POA: Diagnosis not present

## 2012-05-25 DIAGNOSIS — H2510 Age-related nuclear cataract, unspecified eye: Secondary | ICD-10-CM | POA: Diagnosis not present

## 2012-06-28 ENCOUNTER — Telehealth: Payer: Self-pay | Admitting: Family Medicine

## 2012-06-28 ENCOUNTER — Other Ambulatory Visit: Payer: Self-pay | Admitting: Family Medicine

## 2012-06-28 DIAGNOSIS — I10 Essential (primary) hypertension: Secondary | ICD-10-CM

## 2012-06-28 MED ORDER — METOPROLOL SUCCINATE ER 50 MG PO TB24: 50.0000 mg | ORAL_TABLET | Freq: Two times a day (BID) | ORAL | Status: DC

## 2012-06-28 NOTE — Telephone Encounter (Signed)
Done

## 2012-06-28 NOTE — Telephone Encounter (Signed)
PT needs refill for Toprol 50 mg xl to [facility]. [address].  Pt has appt for Med check with you 07/06/12 but will be out of meds in 4 days.

## 2012-07-06 ENCOUNTER — Ambulatory Visit (INDEPENDENT_AMBULATORY_CARE_PROVIDER_SITE_OTHER): Payer: Medicare Other | Admitting: Family Medicine

## 2012-07-06 ENCOUNTER — Encounter: Payer: Self-pay | Admitting: Family Medicine

## 2012-07-06 VITALS — BP 184/94 | HR 80 | Ht 71.0 in | Wt 172.0 lb

## 2012-07-06 DIAGNOSIS — I10 Essential (primary) hypertension: Secondary | ICD-10-CM | POA: Diagnosis not present

## 2012-07-06 DIAGNOSIS — R7301 Impaired fasting glucose: Secondary | ICD-10-CM

## 2012-07-06 DIAGNOSIS — E119 Type 2 diabetes mellitus without complications: Secondary | ICD-10-CM

## 2012-07-06 DIAGNOSIS — R202 Paresthesia of skin: Secondary | ICD-10-CM

## 2012-07-06 DIAGNOSIS — E7800 Pure hypercholesterolemia, unspecified: Secondary | ICD-10-CM

## 2012-07-06 DIAGNOSIS — R209 Unspecified disturbances of skin sensation: Secondary | ICD-10-CM

## 2012-07-06 LAB — COMPREHENSIVE METABOLIC PANEL
ALT: 30 U/L (ref 0–53)
AST: 31 U/L (ref 0–37)
Albumin: 4.8 g/dL (ref 3.5–5.2)
Alkaline Phosphatase: 42 U/L (ref 39–117)
Glucose, Bld: 122 mg/dL — ABNORMAL HIGH (ref 70–99)
Potassium: 4.7 mEq/L (ref 3.5–5.3)
Sodium: 140 mEq/L (ref 135–145)
Total Bilirubin: 0.6 mg/dL (ref 0.3–1.2)
Total Protein: 7.8 g/dL (ref 6.0–8.3)

## 2012-07-06 LAB — LIPID PANEL
HDL: 58 mg/dL (ref >39)
LDL Cholesterol: 95 mg/dL (ref 0–99)
Total CHOL/HDL Ratio: 3.2 Ratio
VLDL: 31 mg/dL (ref 0–40)

## 2012-07-06 LAB — CBC WITH DIFFERENTIAL/PLATELET
Basophils Relative: 1 % (ref 0–1)
Eosinophils Absolute: 0.1 10*3/uL (ref 0.0–0.7)
Eosinophils Relative: 1 % (ref 0–5)
Lymphs Abs: 1.8 10*3/uL (ref 0.7–4.0)
MCH: 32.6 pg (ref 26.0–34.0)
MCHC: 34.3 g/dL (ref 30.0–36.0)
MCV: 94.9 fL (ref 78.0–100.0)
Platelets: 323 10*3/uL (ref 150–400)
RBC: 4.33 MIL/uL (ref 4.22–5.81)

## 2012-07-06 LAB — TSH: TSH: 2.087 u[IU]/mL (ref 0.350–4.500)

## 2012-07-06 LAB — VITAMIN B12: Vitamin B-12: 350 pg/mL (ref 211–911)

## 2012-07-06 MED ORDER — LOVASTATIN 20 MG PO TABS
40.0000 mg | ORAL_TABLET | Freq: Every day | ORAL | Status: DC
Start: 2012-07-06 — End: 2013-01-03

## 2012-07-06 MED ORDER — LISINOPRIL 5 MG PO TABS: 5.0000 mg | ORAL_TABLET | Freq: Every day | ORAL | Status: DC

## 2012-07-06 NOTE — Progress Notes (Signed)
Chief Complaint  Patient presents with  . Hypertension    fasting med check.   Saw Dr. Antoine Poche initially for eval prior to shoulder surgery.  He saw him a few times--echo was okay.  Couldn't do stress test related to high blood pressures.  He saw Dr. Antoine Poche a few times, but is following up here for ongoing management of his blood pressure.  He had the R shoulder surgery, and mobility is improved, but still has some weakness.  Pain is controlled by tylenol. Completed 12 weeks of PT, and thinks he regressed some since stopping therapy.  Had ongoing orthostatic symptoms and low blood pressures since last visit with Dr. Antoine Poche (07/2011, he had syncopal episode related to LBP/orthostasis).  Patient stopped the lisinopril completely about 3 months ago.  BP's have been running 140-150/70 "on a good day".  Highest 160/70-80, usually very early in the morning.  BP this morning prior to coming was also high 168/75.  He feels tense sometimes, hard to relax.  Doesn't feel like he worries about things--marriage and finances are fine.  The only thing he worries about really is his blood pressure.  Continues to follow a low sodium diet.  Drinks 2-3 drinks/day. Hasn't been exercising much due to the cold weather.  Occasional tingling or sharp pain in his fingertips--it moves around to different fingers, and to both hands.  Discomfort last for hours (not seconds/minutes), is mild, and occurs sporadically, not daily.  Has been having this problem for about a month.  Occurs in 1st-3rd fingers on both hands, just one finger at a time only.  Impaired fasting glucose/DM.  Doesn't check sugars elsewhere.  No polydipsia or polyuria.  Numbness in fingers as above over the last month. Sees ophtho twice a year.   Past Medical History  Diagnosis Date  . Hypertension   . Hypercholesteremia   . Impaired fasting glucose   . Prostate cancer radiation + seeding implant (Dr.[name])  . Rotator cuff tear, right 10/2010   supraspinatous and infraspinatous  . Diabetes mellitus   . Macular degeneration    Past Surgical History  Procedure Date  . Tonsillectomy and adenoidectomy age 85  . Prostate seed implant   . Rotator cuff repair 06/16/2011    right (Dr. Luiz Blare)   History   Social History  . Marital Status: Married    Spouse Name: N/A    Number of Children: 4  . Years of Education: N/A   Occupational History  . Retired    Social History Main Topics  . Smoking status: Former Smoker -- 1.0 packs/day for 20 years    Types: Cigarettes    Quit date: 05/31/1980  . Smokeless tobacco: Not on file  . Alcohol Use: Yes     Comment: 2 beers/day  . Drug Use: No  . Sexually Active: Not on file   Other Topics Concern  . Not on file   Social History Narrative  . No narrative on file   Current outpatient prescriptions:Acetaminophen (TYLENOL ARTHRITIS PAIN PO), Take 2 tablets by mouth 2 (two) times daily.  , Disp: , Rfl: ;  lovastatin (MEVACOR) 20 MG tablet, TAKE 2 TABLETS (40 MG TOTAL) BY MOUTH AT BEDTIME., Disp: 180 tablet, Rfl: 0;  metoprolol succinate (TOPROL-XL) 50 MG 24 hr tablet, Take 1 tablet (50 mg total) by mouth 2 (two) times daily. Take with or immediately following a meal., Disp: 180 tablet, Rfl: 0 Multiple Vitamins-Minerals (EYE VITAMINS PO), Take 1 tablet by mouth daily. Patient states  vitamin is actually called "pressure vision," supplement for glaucoma. , Disp: , Rfl: ;  Tamsulosin HCl (FLOMAX) 0.4 MG CAPS, Take 0.4 mg by mouth daily.  , Disp: , Rfl: ;  lisinopril (PRINIVIL,ZESTRIL) 10 MG tablet, Take 1 tablet (10 mg total) by mouth 2 (two) times daily., Disp: 60 tablet, Rfl: 11  No Known Allergies  ROS:  Denies fevers, URI symptoms, cough, shortness of breath, headache, dizziness, chest pain, GI complaints, GU complaints, bleeding/bruising, skin rash.  +discomfort intermittently and moving around to different fingertips bilaterally as per HPI. No vision changes or other concerns.  +slight  anxiety.  No depression  PHYSICAL EXAM: BP 196/100  Pulse 80  Ht 5\' 11"  (1.803 m)  Wt 172 lb (78.019 kg)  BMI 23.99 kg/m2 184/94 on repeat by MD RA Well developed, pleasant male in no distress Neck: no lymphadenopathy, thyromegaly or carotid bruit Heart: 3/6 SEM at RUSB Lungs: clear bilaterally Abdomen: soft, nontender, no organomegaly or mass. +abdominal obesity Back: no CVA or spine tenderness Extremities: no edema, 2+ pulse Normal diabetic foot exam.  Lab Results  Component Value Date   HGBA1C 5.5 07/06/2012   ASSESSMENT/PLAN: 1. Essential hypertension, benign  Comprehensive metabolic panel, CBC with Differential, lisinopril (PRINIVIL,ZESTRIL) 5 MG tablet  2. Impaired fasting glucose  HgB A1c  3. Type II or unspecified type diabetes mellitus without mention of complication, not stated as uncontrolled  CBC with Differential, TSH, Microalbumin / creatinine urine ratio  4. Paresthesia  CBC with Differential, TSH, Vitamin B12  5. Pure hypercholesterolemia  Lipid panel, Comprehensive metabolic panel, lovastatin (MEVACOR) 20 MG tablet   DM--diet controlled.  Encouraged daily exercise, weight loss and cutting back on alcohol intake HTN--restart lisinopril at 5mg  daily.  If tolerating, and BP's remain high, consider increasing to 10mg , but for now, given syncope and his fear/concerns over BP getting low/feeling dizzy, start slow.  Paresthesia, migratory.  Normal exam.  Check labs.  Return if persistent/worsening/weakness or other concerns   c-met, TSH, lipid, CBC, B12

## 2012-07-06 NOTE — Patient Instructions (Signed)
Start lisinopril 5mg  daily.  Try first taking it in the evenings--if having side effects or problems, can change time of day. Try and exercise at least 150 min/week (at least 30 minutes 5x/week).  Cut back on alcohol to 2/day Try and lose some of the weight at your waist--exercising, cutting back on calories (including alcohol, but also portion sizes, eating healthy).

## 2012-07-07 ENCOUNTER — Encounter: Payer: Self-pay | Admitting: Family Medicine

## 2012-10-02 ENCOUNTER — Other Ambulatory Visit: Payer: Self-pay | Admitting: Family Medicine

## 2012-10-09 DIAGNOSIS — R351 Nocturia: Secondary | ICD-10-CM | POA: Diagnosis not present

## 2012-10-09 DIAGNOSIS — C61 Malignant neoplasm of prostate: Secondary | ICD-10-CM | POA: Diagnosis not present

## 2012-10-16 ENCOUNTER — Other Ambulatory Visit: Payer: Self-pay | Admitting: *Deleted

## 2012-10-16 ENCOUNTER — Encounter: Payer: Self-pay | Admitting: Internal Medicine

## 2012-10-16 ENCOUNTER — Telehealth: Payer: Self-pay | Admitting: Internal Medicine

## 2012-10-16 DIAGNOSIS — K625 Hemorrhage of anus and rectum: Secondary | ICD-10-CM

## 2012-10-16 NOTE — Telephone Encounter (Signed)
Refer to GI for consult--for rectal bleeding, never had colonoscopy.  They will want to see him for consult first

## 2012-10-16 NOTE — Telephone Encounter (Signed)
No CPE done here, so no documentation in computer as to if/when he had prior colonoscopy.  Please call pt--see when last one was, who he saw.  Normally I would recommend OV for rectal bleeding for eval, but likely was just hemoccult + at visit with urologist, and no true bleeding--please verify with pt.  If this is case, and he is due for routine colonoscopy, okay to refer.  He had a normal CBC in February.

## 2012-10-16 NOTE — Telephone Encounter (Signed)
Patient scheduled with Dr.Pyrtle 11/08/12 @ 9:30am. Pt aware.

## 2012-10-16 NOTE — Telephone Encounter (Signed)
Spoke with patient and he did not have a positive stool card with urology. He states that he did them for you in the past and they were negative. He stated although in the past you have recommended that he have a colonoscopy done, he has never had one. He states that he has has some rectal bleeding over the last year a few times with several months in between. He was just concerned. He also wonder if this could be due to the 25 doses of radiation that he had done for his prostate. Would you like to see him or still refer?

## 2012-10-16 NOTE — Telephone Encounter (Signed)
Pt went and seen Dr. Gaynelle Arabian (urologist) and he seen that pt has rectal bleeding and recommended he calling here to see if he can get a referral to get a colonoscopy done.

## 2012-11-03 ENCOUNTER — Encounter: Payer: Self-pay | Admitting: Internal Medicine

## 2012-11-08 ENCOUNTER — Ambulatory Visit (INDEPENDENT_AMBULATORY_CARE_PROVIDER_SITE_OTHER): Payer: Medicare Other | Admitting: Internal Medicine

## 2012-11-08 ENCOUNTER — Encounter: Payer: Self-pay | Admitting: Internal Medicine

## 2012-11-08 VITALS — BP 190/100 | HR 80 | Ht 71.0 in | Wt 172.4 lb

## 2012-11-08 DIAGNOSIS — Z923 Personal history of irradiation: Secondary | ICD-10-CM

## 2012-11-08 DIAGNOSIS — K625 Hemorrhage of anus and rectum: Secondary | ICD-10-CM | POA: Diagnosis not present

## 2012-11-08 DIAGNOSIS — Z1211 Encounter for screening for malignant neoplasm of colon: Secondary | ICD-10-CM

## 2012-11-08 DIAGNOSIS — Z8546 Personal history of malignant neoplasm of prostate: Secondary | ICD-10-CM | POA: Diagnosis not present

## 2012-11-08 MED ORDER — PEG-KCL-NACL-NASULF-NA ASC-C 100 G PO SOLR: 1.0000 | Freq: Once | ORAL | Status: DC

## 2012-11-08 NOTE — Progress Notes (Signed)
Patient ID: Jeffrey [name], male   DOB: 04/30/1933, 79 y.o.   MRN: [id number] HPI: Mr. Cobern is a 79 year old male with a past medical history of diabetes, hypertension, hypercholesterolemia and prostate cancer status post XRT who is seen in consultation at the request of Dr. Lynelle Doctor to evaluate intermittent rectal bleeding. Patient reports that over the last 6 months or so he has had very intermittent red blood per rectum. He reports at times the blood can be dark in color. He reports he can happen one to 2 times per month but also not happen for 2-3 months. He reports mostly formed stools occurring 1-2 times daily. Occasionally he will miss a day between bowel movements. He denies abdominal pain. Reports a good appetite and stable weight. No nausea or vomiting. No dysphagia or odynophagia. No family history of colon cancer. He has never had a colonoscopy.  Patient Active Problem List   Diagnosis Date Noted  . Murmur 02/23/2011  . Type II or unspecified type diabetes mellitus without mention of complication, not stated as uncontrolled 02/05/2011  . Essential hypertension, benign 11/26/2010  . Impaired fasting glucose 11/26/2010  . Pure hypercholesterolemia 11/26/2010  . Prostate cancer 11/26/2010    Past Surgical History  Procedure Laterality Date  . Tonsillectomy and adenoidectomy  age 49  . Prostate seed implant    . Rotator cuff repair  06/16/2011    right (Dr. Luiz Blare)    Current Outpatient Prescriptions  Medication Sig Dispense Refill  . OVER THE COUNTER MEDICATION Sleep aid, takes it most nights      . Acetaminophen (TYLENOL ARTHRITIS PAIN PO) Take 2 tablets by mouth 2 (two) times daily.        [name] lisinopril (PRINIVIL,ZESTRIL) 5 MG tablet Take 1 tablet (5 mg total) by mouth daily.  90 tablet  1  . lovastatin (MEVACOR) 20 MG tablet Take 2 tablets (40 mg total) by mouth at bedtime.  180 tablet  1  . metoprolol succinate (TOPROL-XL) 50 MG 24 hr tablet TAKE 1 TABLET BY MOUTH 2 TIMES DAILY (TAKE  WITH OR IMMEDIATELY FOLLOWING A MEAL)  180 tablet  0  . Multiple Vitamins-Minerals (EYE VITAMINS PO) Take 1 tablet by mouth daily. Patient states vitamin is actually called "pressure vision," supplement for glaucoma.       . peg 3350 powder (MOVIPREP) 100 G SOLR Take 1 kit (100 g total) by mouth once.  1 kit  0   No current facility-administered medications for this visit.    No Known Allergies  Family History  Problem Relation Age of Onset  . Heart disease Father 89    Died suddenly.  No diagnosis  . Stroke Sister     History  Substance Use Topics  . Smoking status: Former Smoker -- 1.00 packs/day for 20 years    Types: Cigarettes    Quit date: 05/31/1980  . Smokeless tobacco: Never Used  . Alcohol Use: Yes     Comment: 2 beers/day    ROS: As per history of present illness, otherwise negative  BP 190/100  Pulse 80  Ht 5\' 11"  (1.803 m)  Wt 172 lb 6 oz (78.189 kg)  BMI 24.05 kg/m2 Constitutional: Well-developed and well-nourished. No distress. HEENT: Normocephalic and atraumatic. Oropharynx is clear and moist. No oropharyngeal exudate. Conjunctivae are normal.  No scleral icterus. Neck: Neck supple. Trachea midline. Cardiovascular: Normal rate, regular rhythm and intact distal pulses. 2/6 SEM at RUSB Pulmonary/chest: Effort normal and breath sounds normal. No wheezing, rales or  rhonchi. Abdominal: Soft, nontender, nondistended. Bowel sounds active throughout. Extremities: no clubbing, cyanosis, or edema Lymphadenopathy: No cervical adenopathy noted. Neurological: Alert and oriented to person place and time. Skin: Skin is warm and dry. No rashes noted. Psychiatric: Normal mood and affect. Behavior is normal.  RELEVANT LABS AND IMAGING: CBC    Component Value Date/Time   WBC 7.1 07/06/2012 1000   RBC 4.33 07/06/2012 1000   HGB 14.1 07/06/2012 1000   HCT 41.1 07/06/2012 1000   PLT 323 07/06/2012 1000   MCV 94.9 07/06/2012 1000   MCH 32.6 07/06/2012 1000   MCHC 34.3 07/06/2012  1000   RDW 13.1 07/06/2012 1000   LYMPHSABS 1.8 07/06/2012 1000   MONOABS 0.8 07/06/2012 1000   EOSABS 0.1 07/06/2012 1000   BASOSABS 0.1 07/06/2012 1000    CMP     Component Value Date/Time   NA 140 07/06/2012 1000   K 4.7 07/06/2012 1000   CL 101 07/06/2012 1000   CO2 28 07/06/2012 1000   GLUCOSE 122* 07/06/2012 1000   BUN 15 07/06/2012 1000   CREATININE 0.81 07/06/2012 1000   CREATININE 1.0 08/16/2008 1039   CALCIUM 9.9 07/06/2012 1000   PROT 7.8 07/06/2012 1000   ALBUMIN 4.8 07/06/2012 1000   AST 31 07/06/2012 1000   ALT 30 07/06/2012 1000   ALKPHOS 42 07/06/2012 1000   BILITOT 0.6 07/06/2012 1000   GFRNONAA >60 08/09/2008 0905   GFRAA  Value: >60        The eGFR has been calculated using the MDRD equation. This calculation has not been validated in all clinical situations. eGFR's persistently <60 mL/min signify possible Chronic Kidney Disease. 08/09/2008 8413    ASSESSMENT/PLAN:  79 year old male with a past medical history of diabetes, hypertension, hypercholesterolemia and prostate cancer status post XRT who is seen in consultation at the request of Dr. Lynelle Doctor to evaluate intermittent rectal bleeding.  1.  Rectal bleeding/CRC screening -- the patient's rectal bleeding is felt most likely secondary to radiation proctitis, though I cannot exclude other potential colon pathology such as inflammation or polyp/tumor. I have recommended colonoscopy both to evaluate this rectal bleeding and for screening. We discussed the test extensively today including the risks and benefits and he is agreeable to proceed. We also discussed possible APC should radiation proctitis be found. We discussed other options for radiation proctitis which includes observation assuming there is no progressive anemia or significant bleeding, topical therapy such as sucralfate enema, and APC ablation. This test will be performed in the hospital setting so that APC will be available should this be necessary. We have scheduled this for him on 11/28/2012.  I will check CBC today. Further recommendations thereafter

## 2012-11-08 NOTE — Patient Instructions (Addendum)
You have been scheduled for a colonoscopy with propofol. Please follow written instructions given to you at your visit today.  Please pick up your prep kit at the pharmacy within the next 1-3 days. If you use inhalers (even only as needed), please bring them with you on the day of your procedure. Your physician has requested that you go to www.startemmi.com and enter the access code given to you at your visit today. This web site gives a general overview about your procedure. However, you should still follow specific instructions given to you by our office regarding your preparation for the procedure.                                                We are excited to introduce MyChart, a new best-in-class service that provides you online access to important information in your electronic medical record. We want to make it easier for you to view your health information - all in one secure location - when and where you need it. We expect MyChart will enhance the quality of care and service we provide.  When you register for MyChart, you can:    View your test results.    Request appointments and receive appointment reminders via email.    Request medication renewals.    View your medical history, allergies, medications and immunizations.    Communicate with your physician's office through a password-protected site.    Conveniently print information such as your medication lists.  To find out if MyChart is right for you, please talk to a member of our clinical staff today. We will gladly answer your questions about this free health and wellness tool.  If you are age 18 or older and want a member of your family to have access to your record, you must provide written consent by completing a proxy form available at our office. Please speak to our clinical staff about guidelines regarding accounts for patients younger than age 18.  As you activate your MyChart account and need any technical  assistance, please call the MyChart technical support line at [phone] ([phone]) or email your question to [email or web address]. If you email your question(s), please include your name, a return phone number and the best time to reach you.  If you have non-urgent health-related questions, you can send a message to our office through MyChart at [email or web address]. If you have a medical emergency, call 911.  Thank you for using MyChart as your new health and wellness resource!   MyChart licensed from [employer],  1999-2010. Patents Pending.   

## 2012-11-23 DIAGNOSIS — H353190 Nonexudative age-related macular degeneration, unspecified eye, stage unspecified: Secondary | ICD-10-CM | POA: Diagnosis not present

## 2012-11-23 DIAGNOSIS — H2510 Age-related nuclear cataract, unspecified eye: Secondary | ICD-10-CM | POA: Diagnosis not present

## 2012-11-28 ENCOUNTER — Encounter (HOSPITAL_COMMUNITY)
Admission: RE | Disposition: A | Discharge status: Home / Self Care | Payer: Self-pay | Source: Ambulatory Visit | Attending: Internal Medicine

## 2012-11-28 ENCOUNTER — Ambulatory Visit (HOSPITAL_COMMUNITY)
Admission: RE | Admit: 2012-11-28 | Discharge: 2012-11-28 | Disposition: A | Discharge status: Home / Self Care | Payer: Medicare Other | Source: Ambulatory Visit | Attending: Internal Medicine | Admitting: Internal Medicine

## 2012-11-28 ENCOUNTER — Encounter (HOSPITAL_COMMUNITY): Payer: Self-pay | Admitting: *Deleted

## 2012-11-28 ENCOUNTER — Telehealth: Payer: Self-pay | Admitting: *Deleted

## 2012-11-28 DIAGNOSIS — K6289 Other specified diseases of anus and rectum: Secondary | ICD-10-CM | POA: Insufficient documentation

## 2012-11-28 DIAGNOSIS — R011 Cardiac murmur, unspecified: Secondary | ICD-10-CM | POA: Insufficient documentation

## 2012-11-28 DIAGNOSIS — I10 Essential (primary) hypertension: Secondary | ICD-10-CM | POA: Diagnosis not present

## 2012-11-28 DIAGNOSIS — E7800 Pure hypercholesterolemia, unspecified: Secondary | ICD-10-CM | POA: Insufficient documentation

## 2012-11-28 DIAGNOSIS — K625 Hemorrhage of anus and rectum: Secondary | ICD-10-CM | POA: Diagnosis not present

## 2012-11-28 DIAGNOSIS — E119 Type 2 diabetes mellitus without complications: Secondary | ICD-10-CM | POA: Diagnosis not present

## 2012-11-28 DIAGNOSIS — Y842 Radiological procedure and radiotherapy as the cause of abnormal reaction of the patient, or of later complication, without mention of misadventure at the time of the procedure: Secondary | ICD-10-CM | POA: Insufficient documentation

## 2012-11-28 DIAGNOSIS — C61 Malignant neoplasm of prostate: Secondary | ICD-10-CM | POA: Diagnosis not present

## 2012-11-28 DIAGNOSIS — Z79899 Other long term (current) drug therapy: Secondary | ICD-10-CM | POA: Insufficient documentation

## 2012-11-28 DIAGNOSIS — K627 Radiation proctitis: Secondary | ICD-10-CM

## 2012-11-28 DIAGNOSIS — Z87891 Personal history of nicotine dependence: Secondary | ICD-10-CM | POA: Insufficient documentation

## 2012-11-28 DIAGNOSIS — Z1211 Encounter for screening for malignant neoplasm of colon: Secondary | ICD-10-CM

## 2012-11-28 DIAGNOSIS — Z923 Personal history of irradiation: Secondary | ICD-10-CM | POA: Diagnosis not present

## 2012-11-28 HISTORY — DX: Cardiac murmur, unspecified: R01.1

## 2012-11-28 HISTORY — DX: Radiation proctitis: K62.7

## 2012-11-28 HISTORY — PX: COLONOSCOPY: SHX5424

## 2012-11-28 LAB — HM COLONOSCOPY

## 2012-11-28 SURGERY — COLONOSCOPY
Anesthesia: Moderate Sedation

## 2012-11-28 MED ORDER — MIDAZOLAM HCL 10 MG/2ML IJ SOLN
INTRAMUSCULAR | Status: AC
  Filled 2012-11-28: qty 2

## 2012-11-28 MED ORDER — MESALAMINE 1000 MG RE SUPP: RECTAL | Status: DC

## 2012-11-28 MED ORDER — MIDAZOLAM HCL 5 MG/5ML IJ SOLN
INTRAMUSCULAR | Status: DC | PRN
  Administered 2012-11-28 (×2): 2 mg via INTRAVENOUS
  Administered 2012-11-28: 1 mg via INTRAVENOUS
  Administered 2012-11-28 (×2): 2 mg via INTRAVENOUS

## 2012-11-28 MED ORDER — SODIUM CHLORIDE 0.9 % IV SOLN: INTRAVENOUS | Status: DC

## 2012-11-28 MED ORDER — FENTANYL CITRATE 0.05 MG/ML IJ SOLN
INTRAMUSCULAR | Status: AC
  Filled 2012-11-28: qty 2

## 2012-11-28 MED ORDER — FENTANYL CITRATE 0.05 MG/ML IJ SOLN
INTRAMUSCULAR | Status: DC | PRN
  Administered 2012-11-28 (×4): 25 ug via INTRAVENOUS

## 2012-11-28 NOTE — Telephone Encounter (Signed)
Per Dr Rhea Belton, ordered [facility] and mailed pt an appt letter for 12/27/12.

## 2012-11-28 NOTE — Op Note (Signed)
[facility] [address] [place], [address]   COLONOSCOPY PROCEDURE REPORT  PATIENT: Jeffrey, [name]  MR#: [id number] BIRTHDATE: 1932-11-27 , 80  yrs. old GENDER: Male ENDOSCOPIST: Beverley Fiedler, MD REFERRED Edison Simon, M.D. PROCEDURE DATE:  11/28/2012 PROCEDURE:   Colonoscopy with tissue ablation and Colonoscopy with control of bleeding ASA CLASS:   Class III INDICATIONS:Rectal Bleeding, average risk screening, and first colonoscopy. MEDICATIONS: These medications were titrated to patient response per physician's verbal order, Fentanyl 100 mcg IV, and Versed 9 mg IV  DESCRIPTION OF PROCEDURE:   After the risks benefits and alternatives of the procedure were thoroughly explained, informed consent was obtained.  A digital rectal exam revealed no rectal mass.   The Pentax Ped Colon [id number]  endoscope was introduced through the anus and advanced to the cecum, which was identified by both the appendix and ileocecal valve. No adverse events experienced.   The quality of the prep was Moviprep fair in the right colon, good in the left colon.  The instrument was then slowly withdrawn as the colon was fully examined.   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  Destruction of tissue via ablation was performed with APC 1L/min, 40W with success.  There was minimal blood loss from maneuver subsiding by end of procedure. Care was given to ensure that the lumen was suctioned well.   The colon was otherwise normal.  There was no diverticulosis, inflammation, polyps or cancers unless previously stated. Retroflexed views revealed no abnormalities.      The scope was withdrawn and the procedure completed.  COMPLICATIONS: There were no complications.  ENDOSCOPIC IMPRESSION: 1.   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   The colon was otherwise normal  RECOMMENDATIONS: 1.  Avoid NSAIDS 2.  2 weeks of Canasa (mesalamine) suppository 1 g at bedtime after APC ablation of radiation proctitis. 3.  Office follow-up in 1 month   eSigned:  Beverley Fiedler, MD 11/28/2012 9:24 AM  cc: Joselyn Arrow, MD and The Patient

## 2012-11-28 NOTE — H&P (View-Only) (Signed)
Patient ID: FRANCO DULEY, male   DOB: [birth year], 79 y.o.   MRN: [id number] HPI: Mr. Parmelee is a 79 year old male with a past medical history of diabetes, hypertension, hypercholesterolemia and prostate cancer status post XRT who is seen in consultation at the request of Dr. Lynelle Doctor to evaluate intermittent rectal bleeding. Patient reports that over the last 6 months or so he has had very intermittent red blood per rectum. He reports at times the blood can be dark in color. He reports he can happen one to 2 times per month but also not happen for 2-3 months. He reports mostly formed stools occurring 1-2 times daily. Occasionally he will miss a day between bowel movements. He denies abdominal pain. Reports a good appetite and stable weight. No nausea or vomiting. No dysphagia or odynophagia. No family history of colon cancer. He has never had a colonoscopy.  Patient Active Problem List   Diagnosis Date Noted  . Murmur 02/23/2011  . Type II or unspecified type diabetes mellitus without mention of complication, not stated as uncontrolled 02/05/2011  . Essential hypertension, benign 11/26/2010  . Impaired fasting glucose 11/26/2010  . Pure hypercholesterolemia 11/26/2010  . Prostate cancer 11/26/2010    Past Surgical History  Procedure Laterality Date  . Tonsillectomy and adenoidectomy  age 54  . Prostate seed implant    . Rotator cuff repair  06/16/2011    right (Dr. Luiz Blare)    Current Outpatient Prescriptions  Medication Sig Dispense Refill  . OVER THE COUNTER MEDICATION Sleep aid, takes it most nights      . Acetaminophen (TYLENOL ARTHRITIS PAIN PO) Take 2 tablets by mouth 2 (two) times daily.        [name] lisinopril (PRINIVIL,ZESTRIL) 5 MG tablet Take 1 tablet (5 mg total) by mouth daily.  90 tablet  1  . lovastatin (MEVACOR) 20 MG tablet Take 2 tablets (40 mg total) by mouth at bedtime.  180 tablet  1  . metoprolol succinate (TOPROL-XL) 50 MG 24 hr tablet TAKE 1 TABLET BY MOUTH 2 TIMES DAILY (TAKE  WITH OR IMMEDIATELY FOLLOWING A MEAL)  180 tablet  0  . Multiple Vitamins-Minerals (EYE VITAMINS PO) Take 1 tablet by mouth daily. Patient states vitamin is actually called "pressure vision," supplement for glaucoma.       . peg 3350 powder (MOVIPREP) 100 G SOLR Take 1 kit (100 g total) by mouth once.  1 kit  0   No current facility-administered medications for this visit.    No Known Allergies  Family History  Problem Relation Age of Onset  . Heart disease Father 22    Died suddenly.  No diagnosis  . Stroke Sister     History  Substance Use Topics  . Smoking status: Former Smoker -- 1.00 packs/day for 20 years    Types: Cigarettes    Quit date: 05/31/1980  . Smokeless tobacco: Never Used  . Alcohol Use: Yes     Comment: 2 beers/day    ROS: As per history of present illness, otherwise negative  BP 190/100  Pulse 80  Ht 5\' 11"  (1.803 m)  Wt 172 lb 6 oz (78.189 kg)  BMI 24.05 kg/m2 Constitutional: Well-developed and well-nourished. No distress. HEENT: Normocephalic and atraumatic. Oropharynx is clear and moist. No oropharyngeal exudate. Conjunctivae are normal.  No scleral icterus. Neck: Neck supple. Trachea midline. Cardiovascular: Normal rate, regular rhythm and intact distal pulses. 2/6 SEM at RUSB Pulmonary/chest: Effort normal and breath sounds normal. No wheezing, rales or  rhonchi. Abdominal: Soft, nontender, nondistended. Bowel sounds active throughout. Extremities: no clubbing, cyanosis, or edema Lymphadenopathy: No cervical adenopathy noted. Neurological: Alert and oriented to person place and time. Skin: Skin is warm and dry. No rashes noted. Psychiatric: Normal mood and affect. Behavior is normal.  RELEVANT LABS AND IMAGING: CBC    Component Value Date/Time   WBC 7.1 07/06/2012 1000   RBC 4.33 07/06/2012 1000   HGB 14.1 07/06/2012 1000   HCT 41.1 07/06/2012 1000   PLT 323 07/06/2012 1000   MCV 94.9 07/06/2012 1000   MCH 32.6 07/06/2012 1000   MCHC 34.3 07/06/2012  1000   RDW 13.1 07/06/2012 1000   LYMPHSABS 1.8 07/06/2012 1000   MONOABS 0.8 07/06/2012 1000   EOSABS 0.1 07/06/2012 1000   BASOSABS 0.1 07/06/2012 1000    CMP     Component Value Date/Time   NA 140 07/06/2012 1000   K 4.7 07/06/2012 1000   CL 101 07/06/2012 1000   CO2 28 07/06/2012 1000   GLUCOSE 122* 07/06/2012 1000   BUN 15 07/06/2012 1000   CREATININE 0.81 07/06/2012 1000   CREATININE 1.0 08/16/2008 1039   CALCIUM 9.9 07/06/2012 1000   PROT 7.8 07/06/2012 1000   ALBUMIN 4.8 07/06/2012 1000   AST 31 07/06/2012 1000   ALT 30 07/06/2012 1000   ALKPHOS 42 07/06/2012 1000   BILITOT 0.6 07/06/2012 1000   GFRNONAA >60 08/09/2008 0905   GFRAA  Value: >60        The eGFR has been calculated using the MDRD equation. This calculation has not been validated in all clinical situations. eGFR's persistently <60 mL/min signify possible Chronic Kidney Disease. 08/09/2008 4098    ASSESSMENT/PLAN:  79 year old male with a past medical history of diabetes, hypertension, hypercholesterolemia and prostate cancer status post XRT who is seen in consultation at the request of Dr. Lynelle Doctor to evaluate intermittent rectal bleeding.  1.  Rectal bleeding/CRC screening -- the patient's rectal bleeding is felt most likely secondary to radiation proctitis, though I cannot exclude other potential colon pathology such as inflammation or polyp/tumor. I have recommended colonoscopy both to evaluate this rectal bleeding and for screening. We discussed the test extensively today including the risks and benefits and he is agreeable to proceed. We also discussed possible APC should radiation proctitis be found. We discussed other options for radiation proctitis which includes observation assuming there is no progressive anemia or significant bleeding, topical therapy such as sucralfate enema, and APC ablation. This test will be performed in the hospital setting so that APC will be available should this be necessary. We have scheduled this for him on 11/28/2012.  I will check CBC today. Further recommendations thereafter

## 2012-11-28 NOTE — Interval H&P Note (Signed)
History and Physical Interval Note: Pt presents for colonoscopy as discussed at clinic visit in June 2014 No significant interval history. The nature of the procedure, as well as the risks, benefits, and alternatives were carefully and thoroughly reviewed with the patient. Ample time for discussion and questions allowed. The patient understood, was satisfied, and agreed to proceed.     11/28/2012 8:24 AM  Jeffrey [name]  has presented today for surgery, with the diagnosis of Rectal Bleeding  The various methods of treatment have been discussed with the patient and family. After consideration of risks, benefits and other options for treatment, the patient has consented to  Procedure(s): COLONOSCOPY (N/A) as a surgical intervention .  The patient's history has been reviewed, patient examined, no change in status, stable for surgery.  I have reviewed the patient's chart and labs.  Questions were answered to the patient's satisfaction.     Alejandro Gamel M

## 2012-11-29 ENCOUNTER — Encounter (HOSPITAL_COMMUNITY): Payer: Self-pay | Admitting: Internal Medicine

## 2012-12-25 ENCOUNTER — Encounter: Payer: Self-pay | Admitting: Internal Medicine

## 2012-12-27 ENCOUNTER — Encounter: Payer: Self-pay | Admitting: Internal Medicine

## 2012-12-27 ENCOUNTER — Ambulatory Visit (INDEPENDENT_AMBULATORY_CARE_PROVIDER_SITE_OTHER): Payer: Medicare Other | Admitting: Internal Medicine

## 2012-12-27 VITALS — BP 180/102 | HR 68 | Ht 69.75 in | Wt 171.2 lb

## 2012-12-27 DIAGNOSIS — K625 Hemorrhage of anus and rectum: Secondary | ICD-10-CM | POA: Diagnosis not present

## 2012-12-27 DIAGNOSIS — K6289 Other specified diseases of anus and rectum: Secondary | ICD-10-CM | POA: Diagnosis not present

## 2012-12-27 DIAGNOSIS — K627 Radiation proctitis: Secondary | ICD-10-CM

## 2012-12-27 NOTE — Progress Notes (Signed)
  Subjective:    Patient ID: Jeffrey [name], male    DOB: 04/18/1933, 79 y.o.   MRN: [id number]  HPI Jeffrey [name] is a 79 year old male with a past medical history of diabetes, hypertension, hypercholesterolemia and prostate cancer status post XRT with radiation proctitis was seen in followup. He was initially seen on 11/08/2012 to evaluate rectal bleeding. He came for colonoscopy on 11/28/2012 which revealed radiation proctitis which was treated with APC ablation. The colon was otherwise normal with no evidence for colon polyp. He was treated with Canasa suppositories for 2 weeks following APC ablation.  He reports complete resolution of his rectal bleeding. His bowel habits are completely normal and he denies diarrhea or constipation. No further rectal bleeding. No abdominal pain. Good appetite. He is very happy with the results of his colonoscopy and APC treatment to his radiation proctitis  Review of Systems As per history of present illness, otherwise negative  Current Medications, Allergies, Past Medical History, Past Surgical History, Family History and Social History were reviewed in [employer] record.     Objective:   Physical Exam BP 180/102  Pulse 68  Ht 5' 9.75" (1.772 m)  Wt 171 lb 4 oz (77.678 kg)  BMI 24.74 kg/m2 Constitutional: Well-developed and well-nourished. No distress. HEENT: Normocephalic and atraumatic.  No scleral icterus. Cardiovascular: Normal rate, regular rhythm and intact distal pulses.  Pulmonary/chest: Effort normal and breath sounds normal. No wheezing, rales or rhonchi. Abdominal: Soft, nontender, nondistended. Bowel sounds active throughout.  Extremities: no clubbing, cyanosis, or edema Neurological: Alert and oriented to person place and time. Skin: Skin is warm and dry. No rashes noted. Psychiatric: Normal mood and affect. Behavior is normal.     Assessment & Plan:  79 year old male with a past medical history of diabetes,  hypertension, hypercholesterolemia and prostate cancer status post XRT with radiation proctitis was seen in followup  1.  Radiation proctitis with rectal bleeding -- now resolved after APC ablation. He had an excellent result with just one treatment with APC. I've asked that he monitor for recurrent rectal bleeding, because occasionally it will recur and require repeat ablation with APC.  Until that time no further treatment is felt necessary. Hopefully it'll never bother him again. He will call with any questions, concerns, or repeat bleeding. He will return to the care of Dr. Lynelle Doctor for ongoing primary care.  Return PRN

## 2012-12-27 NOTE — Patient Instructions (Addendum)
Follow up with Dr. Rhea Belton as needed                                                We are excited to introduce MyChart, a new best-in-class service that provides you online access to important information in your electronic medical record. We want to make it easier for you to view your health information - all in one secure location - when and where you need it. We expect MyChart will enhance the quality of care and service we provide.  When you register for MyChart, you can:    View your test results.    Request appointments and receive appointment reminders via email.    Request medication renewals.    View your medical history, allergies, medications and immunizations.    Communicate with your physician's office through a password-protected site.    Conveniently print information such as your medication lists.  To find out if MyChart is right for you, please talk to a member of our clinical staff today. We will gladly answer your questions about this free health and wellness tool.  If you are age 77 or older and want a member of your family to have access to your record, you must provide written consent by completing a proxy form available at our office. Please speak to our clinical staff about guidelines regarding accounts for patients younger than age 3.  As you activate your MyChart account and need any technical assistance, please call the MyChart technical support line at [phone] 83-CHART [phone]) or email your question to [email or web address]. If you email your question(s), please include your name, a return phone number and the best time to reach you.  If you have non-urgent health-related questions, you can send a message to our office through MyChart at [email or web address]. If you have a medical emergency, call 911.  Thank you for using MyChart as your new health and wellness resource!   MyChart licensed from [employer],  [id number]. Patents  Pending.

## 2013-01-03 ENCOUNTER — Ambulatory Visit (INDEPENDENT_AMBULATORY_CARE_PROVIDER_SITE_OTHER): Payer: Medicare Other | Admitting: Family Medicine

## 2013-01-03 ENCOUNTER — Encounter: Payer: Self-pay | Admitting: Family Medicine

## 2013-01-03 VITALS — BP 150/108 | HR 72 | Ht 71.0 in | Wt 170.0 lb

## 2013-01-03 DIAGNOSIS — E7800 Pure hypercholesterolemia, unspecified: Secondary | ICD-10-CM | POA: Diagnosis not present

## 2013-01-03 DIAGNOSIS — C61 Malignant neoplasm of prostate: Secondary | ICD-10-CM

## 2013-01-03 DIAGNOSIS — R3915 Urgency of urination: Secondary | ICD-10-CM

## 2013-01-03 DIAGNOSIS — I10 Essential (primary) hypertension: Secondary | ICD-10-CM | POA: Diagnosis not present

## 2013-01-03 DIAGNOSIS — E119 Type 2 diabetes mellitus without complications: Secondary | ICD-10-CM

## 2013-01-03 DIAGNOSIS — Z0000 Encounter for general adult medical examination without abnormal findings: Secondary | ICD-10-CM

## 2013-01-03 LAB — POCT URINALYSIS DIPSTICK
Blood, UA: NEGATIVE
Ketones, UA: NEGATIVE
Protein, UA: NEGATIVE
Spec Grav, UA: 1.01
Urobilinogen, UA: NEGATIVE
pH, UA: 6

## 2013-01-03 MED ORDER — LISINOPRIL 5 MG PO TABS: 5.0000 mg | ORAL_TABLET | Freq: Every day | ORAL | Status: DC

## 2013-01-03 MED ORDER — METOPROLOL SUCCINATE ER 50 MG PO TB24: ORAL_TABLET | ORAL | Status: DC

## 2013-01-03 MED ORDER — LOVASTATIN 20 MG PO TABS: 40.0000 mg | ORAL_TABLET | Freq: Every day | ORAL | Status: DC

## 2013-01-03 NOTE — Patient Instructions (Addendum)
HEALTH MAINTENANCE RECOMMENDATIONS:  It is recommended that you get at least 30 minutes of aerobic exercise at least 5 days/week (for weight loss, you may need as much as 60-90 minutes). This can be any activity that gets your heart rate up. This can be divided in 10-15 minute intervals if needed, but try and build up your endurance at least once a week.  Weight bearing exercise is also recommended twice weekly.  Eat a healthy diet with lots of vegetables, fruits and fiber.  "Colorful" foods have a lot of vitamins (ie green vegetables, tomatoes, red peppers, etc).  Limit sweet tea, regular sodas and alcoholic beverages, all of which has a lot of calories and sugar.  Up to 2 alcoholic drinks daily may be beneficial for men (unless trying to lose weight, watch sugars).  Drink a lot of water.  Sunscreen of at least SPF 30 should be used on all sun-exposed parts of the skin when outside between the hours of 10 am and 4 pm (not just when at beach or pool, but even with exercise, golf, tennis, and yard work!)  Use a sunscreen that says "broad spectrum" so it covers both UVA and UVB rays, and make sure to reapply every 1-2 hours.  Remember to change the batteries in your smoke detectors when changing your clock times in the spring and fall.  Use your seat belt every time you are in a car, and please drive safely and not be distracted with cell phones and texting while driving.  Consider getting hearing eval (many places offer free screenings).  I recommend you get Tdap (tetanus booster with whooping cough vaccine)--not covered by Medicare, but highly recommended. Shingles vaccine is covered by part D--needs to be gotten through pharmacy and likely has a copay; check with your insurance  Cialis (20mg --would start with 1/2 tablet) Viagra 100mg  (start with 1/2 tablet) Levitra (20mg --start at 1/2 tablet)  Call after researching prices, side effects, if you want any of these medications for erectile  dysfunction to try

## 2013-01-03 NOTE — Progress Notes (Signed)
Chief Complaint  Patient presents with  . Med check plus    fasting med check/awv. Patient has no concerns.   Jeffrey [name] is a 79 y.o. male who presents for follow-up on his chronic medical problems, as well as Annual Wellness Visit.  Hypertension follow-up:  Blood pressures elsewhere are 125/70 up to 140's-155/70's-80's.  He no longer sees the "extremes" of >200 or <100 systolic. No longer having any dizziness when he stands up.  The higher BP's tend to be first thing in the morning, and come down after exercise.  Denies dizziness, headaches, chest pain.  Denies side effects of medications.  Had an episode last month where he "wasn't quite right"--wasn't answering his son's questions.  Had been out in the yard in the heat.  Paramedics were called, and all checked out okay--felt to be dehydrated.  No further problems since.  Hyperlipidemia follow-up:  Patient is reportedly following a low-fat, low cholesterol diet.  Compliant with medications and denies medication side effects--he still has an intermittent mild rash across his chest/back that he blames on the lovastatin.  Only intermittent, minimally symptomatic.    Impaired fasting glucose/DM. Doesn't check sugars elsewhere. nonfasting sugar was 120 when checked by paramedic last month. No polydipsia or polyuria. Denies any numbness/tingling (previously had in his fingers, resolved). Sees ophtho twice a year. Checks feet, no concerns.   Immunization History  Administered Date(s) Administered  . Influenza Split 01/30/2012  . Pneumococcal Polysaccharide 12/17/2004  . Td 10/29/2004   Last colonoscopy: 11/2012 with Dr. Rhea Belton (radiation proctitis) Last PSA: per urologist, every 6 months Dentist: twice yearly Ophtho: twice yearly Exercise: yardwork, active outside (recently built a pier).  Walks the dogs, but slow/frequent stops.  Occasionally walks on his own.  AWV: Other doctors caring for pt: GI--Dr. Rhea Belton Urologist: Dr. Earlene Plater Dentist:  Dr. Lyn Hollingshead Ophtho: Dr. Perley Jain (at [facility]) Cardiologist: Dr. Antoine Poche (no longer requires his care) Ortho: Dr. Luiz Blare (no longer under his care; problem resolved)  End of Life issues:  Doesn't have living will or healthcare power of attorney Depression screen:  See scanned form.  Occasionally has early awakening.  He doesn't trust his decisions as much, thinks longer, second-guesses.  Denies depression. ADL screen: gradually worsening hearing loss.  No falls in over a year (which had been related to hypotension)--see scanned form  Past Medical History  Diagnosis Date  . Hypertension   . Hypercholesteremia   . Impaired fasting glucose   . Prostate cancer radiation + seeding implant (Dr.[name])  . Rotator cuff tear, right 10/2010    supraspinatous and infraspinatous  . Diabetes mellitus   . Macular degeneration   . Heart murmur   . Radiation proctitis 11/2012    treated with APC ablation and Canasa suppositories (Dr. Rhea Belton)    Past Surgical History  Procedure Laterality Date  . Tonsillectomy and adenoidectomy  age 58  . Prostate seed implant    . Rotator cuff repair  06/16/2011    right (Dr. Luiz Blare)  . Colonoscopy N/A 11/28/2012    Procedure: COLONOSCOPY;  Surgeon: Beverley Fiedler, MD;  Location: [facility];  Service: Gastroenterology;  Laterality: N/A;    History   Social History  . Marital Status: Married    Spouse Name: N/A    Number of Children: 4  . Years of Education: N/A   Occupational History  . Retired    Social History Main Topics  . Smoking status: Former Smoker -- 1.00 packs/day for 20 years  Types: Cigarettes    Quit date: 05/31/1980  . Smokeless tobacco: Never Used  . Alcohol Use: 1.2 oz/week    2 Cans of beer per week     Comment: 2 beers/day "at least"  . Drug Use: No  . Sexually Active: Yes   Other Topics Concern  . Not on file   Social History Narrative   Retired Emergency planning/management officer.  2 sons, 2 daughters (all in [place]), 15 grandchildren, 1 great grandchild     Family History  Problem Relation Age of Onset  . Heart disease Father 55    Died suddenly.  No diagnosis  . Stroke Sister   . Dementia Mother   . Diabetes Mother   . Cancer Daughter     ovarian (?) vs other male cancer; s/p hyst doing well  . Heart disease Son     congestive heart failure--improved    Current outpatient prescriptions:Acetaminophen (TYLENOL ARTHRITIS PAIN PO), Take 2 tablets by mouth 2 (two) times daily.  , Disp: , Rfl: ;  lisinopril (PRINIVIL,ZESTRIL) 5 MG tablet, Take 1 tablet (5 mg total) by mouth daily., Disp: 90 tablet, Rfl: 1;  lovastatin (MEVACOR) 20 MG tablet, Take 2 tablets (40 mg total) by mouth at bedtime., Disp: 180 tablet, Rfl: 1 metoprolol succinate (TOPROL-XL) 50 MG 24 hr tablet, TAKE 1 TABLET BY MOUTH 2 TIMES DAILY (TAKE WITH OR IMMEDIATELY FOLLOWING A MEAL), Disp: 180 tablet, Rfl: 1;  Multiple Vitamins-Minerals (EYE VITAMINS PO), Take 1 tablet by mouth daily. Patient states vitamin is actually called "pressure vision," supplement for macular degeneration., Disp: , Rfl: ;  OVER THE COUNTER MEDICATION, Sleep aid, takes it most nights, Disp: , Rfl:   No Known Allergies  ROS:  The patient denies anorexia, fever, weight changes, headaches, ear pain, hoarseness, chest pain, palpitations, dizziness, syncope, dyspnea on exertion, cough, swelling, nausea, vomiting, diarrhea, constipation, abdominal pain, melena, hematochezia, indigestion/heartburn, hematuria, weakened urine stream, dysuria, genital lesions, joint pains, numbness, tingling, weakness, tremor, suspicious skin lesions, depression, anxiety, abnormal bleeding/bruising, or enlarged lymph nodes +bilateral hearing loss, L>R Vision loss--worse at night, doesn't drive at night Urinary urgency, denies incontinence.  +ED  PHYSICAL EXAM: BP 150/108  Pulse 72  Ht 5\' 11"  (1.803 m)  Wt 170 lb (77.111 kg)  BMI 23.72 kg/m2 154/100 on repeat by MD, RA  General Appearance:    Alert, cooperative, no distress,  appears stated age  Head:    Normocephalic, without obvious abnormality, atraumatic  Eyes:    PERRL, conjunctiva/corneas clear, EOM's intact, fundi    benign  Ears:    Normal TM's and external ear canals  Nose:   Nares normal, mucosa normal, no drainage or sinus   tenderness  Throat:   Lips, mucosa, and tongue normal; gums normal. Tooth decay/black upper right posterior tooth  Neck:   Supple, no lymphadenopathy;  thyroid:  no   enlargement/tenderness/nodules; no carotid   bruit or JVD  Back:    Spine nontender, no curvature, ROM normal, no CVA     tenderness  Lungs:     Clear to auscultation bilaterally without wheezes, rales or     ronchi; respirations unlabored  Chest Wall:    No tenderness or deformity   Heart:    Regular rate and rhythm, S1 and S2 normal, no rub or gallop. 3/6 SEM heard throughout, loudest at RUSB.  Occasional ectopic beat/pause  Breast Exam:    No chest wall tenderness, masses or gynecomastia  Abdomen:     Soft, non-tender,  nondistended, normoactive bowel sounds,    no masses, no hepatosplenomegaly  Genitalia:    Deferred to urologist  Rectal:    Deferred (to urologist, recent GI eval)  Extremities:   No clubbing, cyanosis or edema  Pulses:   2+ and symmetric all extremities  Skin:   Skin color, texture, turgor normal, no rashes or lesions  Lymph nodes:   Cervical, supraclavicular, and axillary nodes normal  Neurologic:   CNII-XII intact, normal strength, sensation and gait; reflexes 2+ and symmetric throughout          Psych:   Normal mood, affect, hygiene and grooming.    ASSESSMENT/PLAN:  Essential hypertension, benign - white coat component.  overall well controlled - Plan: Urinalysis Dipstick, lisinopril (PRINIVIL,ZESTRIL) 5 MG tablet, metoprolol succinate (TOPROL-XL) 50 MG 24 hr tablet  Type II or unspecified type diabetes mellitus without mention of complication, not stated as uncontrolled - diet controlled - Plan: HM Diabetes Foot Exam, Hemoglobin A1c,  Glucose, random, Urinalysis Dipstick  Prostate cancer - stable; followed by Dr. Onalee Hua  Pure hypercholesterolemia - Plan: Lipid panel, Hepatic function panel, lovastatin (MEVACOR) 20 MG tablet  Urinary urgency - check urine, as pt states urine was NOT checked by urologist  Recommended at least 30 minutes of aerobic activity at least 5 days/week; proper sunscreen use reviewed; healthy diet and alcohol recommendations (less than or equal to 2 drinks/day) reviewed; regular seatbelt use; changing batteries in smoke detectors. . Immunization recommendations discussed--see below.  Due for TdaP, zostavax.  Colonoscopy recommendations reviewed, UTD (recent).  Encouraged TdaP and Zostavax.  He will look into and consider these (TdaP isn't covered by Medicare, Zostavax is covered by Part D, which I don't believe he has.)  Recommended hearing eval--declines for now  Given paperwork for Living Will, healthcare power of attorney. Discussed MOST form--wanted more time to think about, given to take home  Urinary urgency-r/o UTI.  ED--discussed oral medications.  He will research prices, side effects (has no rx coverage) and let us know if he is interested in trying something. Can call for samples/rx.  Discussed viagra, cialis, levitra.  F/u 6 months, sooner prn

## 2013-01-04 LAB — HEMOGLOBIN A1C: Mean Plasma Glucose: 126 mg/dL — ABNORMAL HIGH (ref <117)

## 2013-01-04 LAB — HEPATIC FUNCTION PANEL
ALT: 33 U/L (ref 0–53)
AST: 30 U/L (ref 0–37)
Albumin: 4.5 g/dL (ref 3.5–5.2)
Alkaline Phosphatase: 40 U/L (ref 39–117)
Indirect Bilirubin: 0.4 mg/dL (ref 0.0–0.9)
Total Protein: 7.3 g/dL (ref 6.0–8.3)

## 2013-01-04 LAB — LIPID PANEL
Cholesterol: 186 mg/dL (ref 0–200)
HDL: 54 mg/dL (ref >39)
Total CHOL/HDL Ratio: 3.4 Ratio
VLDL: 44 mg/dL — ABNORMAL HIGH (ref 0–40)

## 2013-02-08 DIAGNOSIS — Z23 Encounter for immunization: Secondary | ICD-10-CM | POA: Diagnosis not present

## 2013-04-05 ENCOUNTER — Other Ambulatory Visit: Payer: Self-pay

## 2013-04-16 DIAGNOSIS — R351 Nocturia: Secondary | ICD-10-CM | POA: Diagnosis not present

## 2013-04-16 DIAGNOSIS — C61 Malignant neoplasm of prostate: Secondary | ICD-10-CM | POA: Diagnosis not present

## 2013-07-03 ENCOUNTER — Other Ambulatory Visit: Payer: Self-pay | Admitting: Family Medicine

## 2013-07-25 ENCOUNTER — Encounter: Payer: Self-pay | Admitting: Family Medicine

## 2013-07-25 ENCOUNTER — Ambulatory Visit (INDEPENDENT_AMBULATORY_CARE_PROVIDER_SITE_OTHER): Payer: Medicare Other | Admitting: Family Medicine

## 2013-07-25 VITALS — BP 128/82 | HR 68 | Ht 71.0 in | Wt 173.0 lb

## 2013-07-25 DIAGNOSIS — R7301 Impaired fasting glucose: Secondary | ICD-10-CM

## 2013-07-25 DIAGNOSIS — Z79899 Other long term (current) drug therapy: Secondary | ICD-10-CM

## 2013-07-25 DIAGNOSIS — E119 Type 2 diabetes mellitus without complications: Secondary | ICD-10-CM

## 2013-07-25 DIAGNOSIS — I10 Essential (primary) hypertension: Secondary | ICD-10-CM | POA: Diagnosis not present

## 2013-07-25 DIAGNOSIS — E7800 Pure hypercholesterolemia, unspecified: Secondary | ICD-10-CM | POA: Diagnosis not present

## 2013-07-25 LAB — POCT GLYCOSYLATED HEMOGLOBIN (HGB A1C): Hemoglobin A1C: 5.7

## 2013-07-25 MED ORDER — LOVASTATIN 20 MG PO TABS: 40.0000 mg | ORAL_TABLET | Freq: Every day | ORAL | Status: DC

## 2013-07-25 MED ORDER — LISINOPRIL 5 MG PO TABS
5.0000 mg | ORAL_TABLET | Freq: Every day | ORAL | Status: DC
Start: 2013-07-25 — End: 2013-10-10

## 2013-07-25 NOTE — Patient Instructions (Signed)
Please cut back on alcohol to no more than 2 drinks/day (less is preferred, in order to help with weight loss). Cut back on sweet tea--drink water when out in restaurants. Limit the salt in your diet--use unsalted nuts, and watch the portion sizes of the nuts.  Try and lose a little weight (specifically from your stomach/waist).  Try and exercise at least 30 minutes every day (you see the benefit it has on your blood pressure!).  Moisturize your feet/legs at least twice daily.  I like Eucerin or Cetaphil.  Use hydrocortisone cream (1%, available over-the-counter) twice daily to the itchy areas, only if needed.  Use it regularly to the patch on the left ankle, twice daily for a week (or less if resolves sooner).   Consider seeing dermatologist if rash does not improve with these measures.  Return for fasting labs, and in August for your next visit.

## 2013-07-25 NOTE — Progress Notes (Signed)
Chief Complaint  Patient presents with  . Hypertension    nonfasting med check.    Patient presents for 6 month med check.  He complains of a rash around both ankles, up to the sock line, described as "pimply".  It is itchy, not painful; worse at night.  He has had the rash for 2-3 months, not improving but not spreading. Seems to have started with the cold weather.  Hyperlipidemia follow-up: Patient is reportedly following a low-fat, low cholesterol diet. Compliant with medications and denies medication side effects--he still has an intermittent mild rash across his chest/back that he blames on the lovastatin. Only intermittent, minimally symptomatic.   Impaired fasting glucose/DM. Doesn't check sugars elsewhere. Admits to eating the sweets his wife bakes, but otherwise tries to avoid candy and sweets.  He eats a lot of grapes.  He has sweet tea when he eats out.  No polydipsia or polyuria. Denies any numbness/tingling (previously had in his fingers, resolved). Sees ophtho twice a year. Checks feet, no concerns.   Hypertension:  At home his BP runs 125-150's (up to 160)/70's.  BP's are lower after being active, higher when sitting around all day.  Denies any chest pain, palpitations, dizziness, edema.  He snacks on nuts (usually salted).  Past Medical History  Diagnosis Date  . Hypertension   . Hypercholesteremia   . Impaired fasting glucose   . Prostate cancer radiation + seeding implant (Dr.[name])  . Rotator cuff tear, right 10/2010    supraspinatous and infraspinatous  . Diabetes mellitus   . Macular degeneration   . Heart murmur   . Radiation proctitis 11/2012    treated with APC ablation and Canasa suppositories (Dr. Hilarie Fredrickson)   Past Surgical History  Procedure Laterality Date  . Tonsillectomy and adenoidectomy  age 15  . Prostate seed implant    . Rotator cuff repair  06/16/2011    right (Dr. Berenice Primas)  . Colonoscopy N/A 11/28/2012    Procedure: COLONOSCOPY;  Surgeon: Jerene Bears, MD;   Location: [facility];  Service: Gastroenterology;  Laterality: N/A;   History   Social History  . Marital Status: Married    Spouse Name: N/A    Number of Children: 4  . Years of Education: N/A   Occupational History  . Retired    Social History Main Topics  . Smoking status: Former Smoker -- 1.00 packs/day for 20 years    Types: Cigarettes    Quit date: 05/31/1980  . Smokeless tobacco: Never Used  . Alcohol Use: 1.2 oz/week    2 Cans of beer per week     Comment: 2 beers/day "at least"  . Drug Use: No  . Sexual Activity: Yes   Other Topics Concern  . Not on file   Social History Narrative   Retired Engineer, structural.  2 sons, 2 daughters (all in [place]), 15 grandchildren, 1 great grandchild   Outpatient Encounter Prescriptions as of 07/25/2013  Medication Sig Note  . Acetaminophen (TYLENOL ARTHRITIS PAIN PO) Take 2 tablets by mouth 2 (two) times daily.     [name] lisinopril (PRINIVIL,ZESTRIL) 5 MG tablet Take 1 tablet (5 mg total) by mouth daily.   [name] lovastatin (MEVACOR) 20 MG tablet Take 2 tablets (40 mg total) by mouth at bedtime.   . metoprolol succinate (TOPROL-XL) 50 MG 24 hr tablet TAKE 1 TABLET BY MOUTH 2 TIMES DAILY (TAKE WITH OR IMMEDIATELY FOLLOWING A MEAL)   . Multiple Vitamins-Minerals (EYE VITAMINS PO) Take 1 tablet by  mouth daily. Patient states vitamin is actually called "pressure vision," supplement for macular degeneration.   [name] OVER THE COUNTER MEDICATION Sleep aid, takes it most nights   . loratadine (CLARITIN) 10 MG tablet Take 10 mg by mouth daily. 07/25/2013: Uses only prn, not needing now   No Known Allergies  ROS:  Denies fevers, chills, URI symptoms, cough, shortness of breath, chest pain, headaches, dizziness, nausea, vomiting, diarrhea, bleeding, bruising.  +rash in both LE's.  No depression, anxiety or other concerns except as noted in HPI.  PHYSICAL EXAM: BP 128/82  Pulse 68  Ht 5\' 11"  (1.803 m)  Wt 173 lb (78.472 kg)  BMI 24.14 kg/m2 170/100 on  repeat by MD Well developed, pleasant male in no distress  Neck: no lymphadenopathy, thyromegaly or carotid bruit  Heart: 3/6 SEM, somewhat harsh, loudest at RUSB.  Regular rate and rhythm, with occasional ectopic beat Lungs: clear bilaterally  Abdomen: soft, nontender, no organomegaly or mass. +abdominal obesity  Back: no CVA or spine tenderness  Extremities: no edema, 2+ pulse  Normal diabetic foot exam. Skin on legs are VERY dry. Lots of flaking.  Scattered white papules/hyperkeratosis. Patch of hyperkeratotic area (no central clearing) at lateral left ankle).    Lab Results  Component Value Date   HGBA1C 5.7 07/25/2013    ASSESSMENT/PLAN:  Type II or unspecified type diabetes mellitus without mention of complication, not stated as uncontrolled - weight loss, daily exercise.  diet reviewed--cut back on sweets, alcohol and sweet tea - Plan: CBC with Differential, Comprehensive metabolic panel, Microalbumin / creatinine urine ratio, TSH, HM Diabetes Foot Exam  IFG (impaired fasting glucose) - Plan: HgB A1c  Essential hypertension, benign - known white coat component to BP with high BP's in office.  borderline at home, better after exercise.  encouraged regular exercise and weight loss - Plan: Comprehensive metabolic panel, lisinopril (PRINIVIL,ZESTRIL) 5 MG tablet  Pure hypercholesterolemia - Plan: Lipid panel, Comprehensive metabolic panel, lovastatin (MEVACOR) 20 MG tablet  Encounter for long-term (current) use of other medications - Plan: CBC with Differential, Lipid panel, Comprehensive metabolic panel, Microalbumin / creatinine urine ratio, TSH  Known white coat hypertension, controlled per home numbers. Reviewed low sodium diet (nuts) Cut back on alcohol, sweet tea. Encouraged weight loss (abdominal obesity)   Return for fasting labs  F/u in 6 months for fasting med check plus/AWV

## 2013-07-26 ENCOUNTER — Encounter: Payer: Self-pay | Admitting: Family Medicine

## 2013-07-28 DIAGNOSIS — H2510 Age-related nuclear cataract, unspecified eye: Secondary | ICD-10-CM | POA: Diagnosis not present

## 2013-07-28 DIAGNOSIS — H353190 Nonexudative age-related macular degeneration, unspecified eye, stage unspecified: Secondary | ICD-10-CM | POA: Diagnosis not present

## 2013-08-01 ENCOUNTER — Other Ambulatory Visit: Payer: Medicare Other

## 2013-08-01 DIAGNOSIS — E119 Type 2 diabetes mellitus without complications: Secondary | ICD-10-CM

## 2013-08-01 DIAGNOSIS — E7800 Pure hypercholesterolemia, unspecified: Secondary | ICD-10-CM | POA: Diagnosis not present

## 2013-08-01 DIAGNOSIS — Z79899 Other long term (current) drug therapy: Secondary | ICD-10-CM

## 2013-08-01 DIAGNOSIS — I10 Essential (primary) hypertension: Secondary | ICD-10-CM

## 2013-08-01 LAB — COMPREHENSIVE METABOLIC PANEL
ALBUMIN: 4.6 g/dL (ref 3.5–5.2)
ALK PHOS: 35 U/L — AB (ref 39–117)
ALT: 24 U/L (ref 0–53)
AST: 23 U/L (ref 0–37)
BUN: 16 mg/dL (ref 6–23)
CALCIUM: 9.4 mg/dL (ref 8.4–10.5)
CO2: 29 meq/L (ref 19–32)
Chloride: 101 mEq/L (ref 96–112)
Creat: 0.72 mg/dL (ref 0.50–1.35)
GLUCOSE: 115 mg/dL — AB (ref 70–99)
POTASSIUM: 4.7 meq/L (ref 3.5–5.3)
Sodium: 142 mEq/L (ref 135–145)
Total Bilirubin: 0.6 mg/dL (ref 0.2–1.2)
Total Protein: 7 g/dL (ref 6.0–8.3)

## 2013-08-01 LAB — LIPID PANEL
CHOL/HDL RATIO: 3.2 ratio
CHOLESTEROL: 162 mg/dL (ref 0–200)
HDL: 50 mg/dL (ref >39)
LDL Cholesterol: 72 mg/dL (ref 0–99)
Triglycerides: 198 mg/dL — ABNORMAL HIGH (ref <150)
VLDL: 40 mg/dL (ref 0–40)

## 2013-08-01 LAB — CBC WITH DIFFERENTIAL/PLATELET
BASOS ABS: 0 10*3/uL (ref 0.0–0.1)
Basophils Relative: 0 % (ref 0–1)
EOS ABS: 0.1 10*3/uL (ref 0.0–0.7)
Eosinophils Relative: 1 % (ref 0–5)
HCT: 41 % (ref 39.0–52.0)
Hemoglobin: 13.4 g/dL (ref 13.0–17.0)
LYMPHS ABS: 1.8 10*3/uL (ref 0.7–4.0)
LYMPHS PCT: 23 % (ref 12–46)
MCH: 31.8 pg (ref 26.0–34.0)
MCHC: 32.7 g/dL (ref 30.0–36.0)
MCV: 97.2 fL (ref 78.0–100.0)
Monocytes Absolute: 0.9 10*3/uL (ref 0.1–1.0)
Monocytes Relative: 11 % (ref 3–12)
NEUTROS PCT: 65 % (ref 43–77)
Neutro Abs: 5.1 10*3/uL (ref 1.7–7.7)
PLATELETS: 328 10*3/uL (ref 150–400)
RBC: 4.22 MIL/uL (ref 4.22–5.81)
RDW: 13.4 % (ref 11.5–15.5)
WBC: 7.8 10*3/uL (ref 4.0–10.5)

## 2013-08-01 LAB — TSH: TSH: 1.576 u[IU]/mL (ref 0.350–4.500)

## 2013-08-02 LAB — MICROALBUMIN / CREATININE URINE RATIO
CREATININE, URINE: 188.5 mg/dL
MICROALB UR: 7.71 mg/dL — AB (ref 0.00–1.89)
MICROALB/CREAT RATIO: 40.9 mg/g — AB (ref 0.0–30.0)

## 2013-10-01 ENCOUNTER — Inpatient Hospital Stay: Payer: Self-pay | Admitting: Internal Medicine

## 2013-10-01 ENCOUNTER — Telehealth: Payer: Self-pay | Admitting: *Deleted

## 2013-10-01 DIAGNOSIS — R4789 Other speech disturbances: Secondary | ICD-10-CM | POA: Diagnosis not present

## 2013-10-01 DIAGNOSIS — I10 Essential (primary) hypertension: Secondary | ICD-10-CM | POA: Diagnosis not present

## 2013-10-01 DIAGNOSIS — G459 Transient cerebral ischemic attack, unspecified: Secondary | ICD-10-CM | POA: Diagnosis not present

## 2013-10-01 DIAGNOSIS — E785 Hyperlipidemia, unspecified: Secondary | ICD-10-CM | POA: Diagnosis not present

## 2013-10-01 LAB — COMPREHENSIVE METABOLIC PANEL
ALT: 45 U/L (ref 12–78)
Albumin: 4.3 g/dL (ref 3.4–5.0)
Alkaline Phosphatase: 46 U/L
Anion Gap: 9 (ref 7–16)
BUN: 17 mg/dL (ref 7–18)
Bilirubin,Total: 0.4 mg/dL (ref 0.2–1.0)
CALCIUM: 9.4 mg/dL (ref 8.5–10.1)
Chloride: 101 mmol/L (ref 98–107)
Co2: 27 mmol/L (ref 21–32)
Creatinine: 0.87 mg/dL (ref 0.60–1.30)
EGFR (African American): >60
EGFR (Non-African Amer.): >60
GLUCOSE: 107 mg/dL — AB (ref 65–99)
Osmolality: 276 (ref 275–301)
POTASSIUM: 3.6 mmol/L (ref 3.5–5.1)
SGOT(AST): 31 U/L (ref 15–37)
Sodium: 137 mmol/L (ref 136–145)
TOTAL PROTEIN: 8.4 g/dL — AB (ref 6.4–8.2)

## 2013-10-01 LAB — ETHANOL
Ethanol %: 0.005 % (ref 0.000–0.080)
Ethanol: 5 mg/dL

## 2013-10-01 LAB — URINALYSIS, COMPLETE
BACTERIA: NONE SEEN
Bilirubin,UR: NEGATIVE
Blood: NEGATIVE
Glucose,UR: NEGATIVE mg/dL (ref 0–75)
Ketone: NEGATIVE
Leukocyte Esterase: NEGATIVE
Nitrite: NEGATIVE
PH: 6 (ref 4.5–8.0)
PROTEIN: NEGATIVE
RBC, UR: NONE SEEN /HPF (ref 0–5)
Specific Gravity: 1.008 (ref 1.003–1.030)
Squamous Epithelial: NONE SEEN

## 2013-10-01 LAB — LIPID PANEL
CHOLESTEROL: 184 mg/dL (ref 0–200)
HDL Cholesterol: 55 mg/dL (ref 40–60)
Ldl Cholesterol, Calc: 49 mg/dL (ref 0–100)
TRIGLYCERIDES: 400 mg/dL — AB (ref 0–200)
VLDL CHOLESTEROL, CALC: 80 mg/dL — AB (ref 5–40)

## 2013-10-01 LAB — CBC
HCT: 42.4 % (ref 40.0–52.0)
HGB: 13.8 g/dL (ref 13.0–18.0)
MCH: 32.6 pg (ref 26.0–34.0)
MCHC: 32.7 g/dL (ref 32.0–36.0)
MCV: 100 fL (ref 80–100)
Platelet: 300 10*3/uL (ref 150–440)
RBC: 4.24 10*6/uL — AB (ref 4.40–5.90)
RDW: 12.7 % (ref 11.5–14.5)
WBC: 7.2 10*3/uL (ref 3.8–10.6)

## 2013-10-01 LAB — PROTIME-INR
INR: 1
Prothrombin Time: 12.9 secs (ref 11.5–14.7)

## 2013-10-01 LAB — TROPONIN I: Troponin-I: <0.02 ng/mL

## 2013-10-01 NOTE — Telephone Encounter (Signed)
Spoke with wife who states that when he woke from his nap he had a little trouble focusing his eyes/with his vision.  He also had problems finding his words.  The vision cleared up quickly, and speech has improved, but still wasn't back to 100% normal.  BP was 157/70.  I could hear pt in background answering his wife's questions--speech sounded normal to me, but given ongoing concern that speech wasn't normal, and given his risk factors (DM/IFG, HTN, hyperlipidemia, etc), I recommend eval at [facility] for possible TIA vs stroke. That [place]5 miles away from their house, and wife was going to bring him . He was not having headache, chest pain, or other symptoms, just slight residual speech/word finding

## 2013-10-01 NOTE — Telephone Encounter (Signed)
Patient's wife called and was concerned about patient. He woke up from a nap and was having some difficulty with speech-wants to check with Dr.Knapp to see if she should take him to the ER.

## 2013-10-02 DIAGNOSIS — I658 Occlusion and stenosis of other precerebral arteries: Secondary | ICD-10-CM | POA: Diagnosis not present

## 2013-10-02 DIAGNOSIS — R4789 Other speech disturbances: Secondary | ICD-10-CM | POA: Diagnosis not present

## 2013-10-02 DIAGNOSIS — G459 Transient cerebral ischemic attack, unspecified: Secondary | ICD-10-CM

## 2013-10-02 DIAGNOSIS — I10 Essential (primary) hypertension: Secondary | ICD-10-CM | POA: Diagnosis not present

## 2013-10-02 HISTORY — DX: Transient cerebral ischemic attack, unspecified: G45.9

## 2013-10-02 LAB — TSH: THYROID STIMULATING HORM: 2.45 u[IU]/mL

## 2013-10-10 ENCOUNTER — Ambulatory Visit (INDEPENDENT_AMBULATORY_CARE_PROVIDER_SITE_OTHER): Payer: Medicare Other | Admitting: Family Medicine

## 2013-10-10 ENCOUNTER — Encounter: Payer: Self-pay | Admitting: Family Medicine

## 2013-10-10 VITALS — BP 140/90 | HR 80 | Ht 70.0 in | Wt 171.0 lb

## 2013-10-10 DIAGNOSIS — E7800 Pure hypercholesterolemia, unspecified: Secondary | ICD-10-CM | POA: Diagnosis not present

## 2013-10-10 DIAGNOSIS — I10 Essential (primary) hypertension: Secondary | ICD-10-CM

## 2013-10-10 DIAGNOSIS — E119 Type 2 diabetes mellitus without complications: Secondary | ICD-10-CM | POA: Diagnosis not present

## 2013-10-10 DIAGNOSIS — R011 Cardiac murmur, unspecified: Secondary | ICD-10-CM

## 2013-10-10 DIAGNOSIS — G459 Transient cerebral ischemic attack, unspecified: Secondary | ICD-10-CM | POA: Diagnosis not present

## 2013-10-10 MED ORDER — LISINOPRIL 10 MG PO TABS: ORAL_TABLET | ORAL | Status: DC

## 2013-10-10 MED ORDER — METOPROLOL SUCCINATE ER 50 MG PO TB24: ORAL_TABLET | ORAL | Status: DC

## 2013-10-10 NOTE — Patient Instructions (Signed)
  Increase the dose of lisinopril to a total of 30mg  daily (3 of the 10mg  tablets)--you can take this all together (probably best in the evening), or split it up.  Continue the metoprolol twice daily.  If your blood pressures remain high after 1-2 weeks on the 30mg  lisinopril (ie >383'A systolic, with no low blood pressures associated with dizziness), then increase to 40mg .  You can fill the prescription from the hospital, and if you don't like how you feel taking it all at once, you can split it in 1/2, twice daily.

## 2013-10-10 NOTE — Progress Notes (Signed)
Chief Complaint  Patient presents with  . Hospitalization Follow-up    hospital follow-up on TIA. lab work and Imaging was recieved from pt   He was hospitalized 5/4-5 at [facility] with TIA symptoms.  Symptoms had included trouble focusing his vision--that was description from wife, who felt like he was "looking through me".  He denied any visual difficulty.  He had trouble with his speech, word finding.  Symptoms lasted 10-15 mins (per pt; wife reports that symptoms lasted longer, about 45-60 mins, still not back to normal at arrival to ER).  He had CT, MRI, carotid u/s, CXR and echocardiogram.  Results were brought in by patient, except not the echocardiogram.  Patient recalls being told of a thickened valve, but not significant stenosis or leakage.  Labs notable for TG 400, pt nonfasting. Other labs normal.  They changed his lisinopril from 5 mg to 40mg . (he was supposed to have increased his lisinopril dose to 10mg  after last labs, but apparently did not see the MyChart info, continued at 5mg /day) They stopped the claritin (told him it could increase BP, even if not the "D"). He was treated with Plavix in the hospital, but wasn't discharged on this.  He was sent home on 325mg  ASA daily.  He admits that while he bought the 81 aspirin, he hadn't been taking it prior to hospitalization.  BP's have been high in the morning, and then going down later in the day, significantly lower after exercise.  Rather than starting the 40mg  which he thought was too drastic of an increase, he increased his dose from 5mg  qd to 10mg  BID.  BP on 5/5 and 5/6 (day he returned home and following day) BP's were 118-128/56-63.  Since that time, BP's have gone up, with his typical fluctuations that he is known to have, 118-190/51-91.  He has been checking many times per day.  It is always lowest after exercise.  He denies any headaches (rare spot of short-lived pain in his head).  He has had only occasional mild  dizziness when standing up at the times that his BP was the lowest, short-lived.  He quit drinking alcohol entirely since coming home from the hospital.  He misses the alcohol--tired of drinking water and orange juice.  He is asking about drinking 2 beers/day (as recommended for potential cardiac benefit,discussed at previous physicals).  Past Medical History  Diagnosis Date  . Hypertension   . Hypercholesteremia   . Impaired fasting glucose   . Prostate cancer radiation + seeding implant (Dr.[name])  . Rotator cuff tear, right 10/2010    supraspinatous and infraspinatous  . Diabetes mellitus   . Macular degeneration   . Heart murmur   . Radiation proctitis 11/2012    treated with APC ablation and Canasa suppositories (Dr. Hilarie Fredrickson)  . TIA (transient ischemic attack) 10/02/13    hosp at [facility] x 1 night   Past Surgical History  Procedure Laterality Date  . Tonsillectomy and adenoidectomy  age 30  . Prostate seed implant    . Rotator cuff repair  06/16/2011    right (Dr. Berenice Primas)  . Colonoscopy N/A 11/28/2012    Procedure: COLONOSCOPY;  Surgeon: Jerene Bears, MD;  Location: [facility];  Service: Gastroenterology;  Laterality: N/A;   History   Social History  . Marital Status: Married    Spouse Name: N/A    Number of Children: 4  . Years of Education: N/A   Occupational History  . Retired  Social History Main Topics  . Smoking status: Former Smoker -- 1.00 packs/day for 20 years    Types: Cigarettes    Quit date: 05/31/1980  . Smokeless tobacco: Never Used  . Alcohol Use: 1.2 oz/week    2 Cans of beer per week     Comment: around 3-4 beers/d, none since hospitalization 10/02/13  . Drug Use: No  . Sexual Activity: Yes   Other Topics Concern  . Not on file   Social History Narrative   Retired Engineer, structural.  2 sons, 2 daughters (all in [place]), 15 grandchildren, 1 great grandchild   Outpatient Encounter Prescriptions as of 10/10/2013  Medication Sig  . Acetaminophen (TYLENOL  ARTHRITIS PAIN PO) Take 2 tablets by mouth 2 (two) times daily.    [name] lisinopril (PRINIVIL,ZESTRIL) 10 MG tablet Take 3 tablets once daily, increase to 4/day if BP's remain >140-150.  [name] lovastatin (MEVACOR) 20 MG tablet Take 2 tablets (40 mg total) by mouth at bedtime.  . metoprolol succinate (TOPROL-XL) 50 MG 24 hr tablet TAKE 1 TABLET BY MOUTH 2 TIMES DAILY (TAKE WITH OR IMMEDIATELY FOLLOWING A MEAL)  . Multiple Vitamins-Minerals (EYE VITAMINS PO) Take 1 tablet by mouth daily. Patient states vitamin is actually called "pressure vision," supplement for macular degeneration.  [name] OVER THE COUNTER MEDICATION Sleep aid, takes it most nights  . [DISCONTINUED] lisinopril (PRINIVIL,ZESTRIL) 5 MG tablet Take 1 tablet (5 mg total) by mouth daily.  . [DISCONTINUED] metoprolol succinate (TOPROL-XL) 50 MG 24 hr tablet TAKE 1 TABLET BY MOUTH 2 TIMES DAILY (TAKE WITH OR IMMEDIATELY FOLLOWING A MEAL)  . [DISCONTINUED] loratadine (CLARITIN) 10 MG tablet Take 10 mg by mouth daily.   No Known Allergies  ROS:  Denies fevers, chills, nausea, vomiting, bowel changes, headaches, dizziness (rare/short-lived when BP low), chest pain, palpitations.  Denies numbness, tingling, tremor.  Speech is back to normal.  Denies vision problems, URI symptoms, cough, shortnes of breath or any other concerns.  PHYSICAL EXAM: BP 140/90  Pulse 80  Ht 5\' 10"  (1.778 m)  Wt 171 lb (77.565 kg)  BMI 24.54 kg/m2 BP is much higher on repeat by me (sysolic 893) He is onlyl mildly anxious when discussing his alcohol.  He is accompanied by his wife [name] HEENT:  PERRL, conjunctiva clear.  OP clear Neck: no lymphadenopathy or thyromegaly.  +bruits heard bilaterally (radiation of murmur) Heart: regular rate and rhythm, with loud murmur (loudest at RUSB), radiates to both carotids Lungs: clear bilaterally Abdomen: soft, nontender no mass Extremities: no edema Psych: normal mood,affect, hygiene and grooming. Intermittently mildly  anxious Neuro: alert and oriented.  Cranial nerve intact. Normal gait, strength  ASSESSMENT/PLAN:  Essential hypertension, benign - known white coat component to BP with higher BP's in office. BP' above goal at home but better after exercise. Increase lisinopril to 30mg ; cont toprol XL - Plan: lisinopril (PRINIVIL,ZESTRIL) 10 MG tablet, metoprolol succinate (TOPROL-XL) 50 MG 24 hr tablet  TIA (transient ischemic attack) - await records from [facility]; records brought by pt indicate mild bilat carotid stenosis, chronic ischemic changes in white matter. cont ASA  Pure hypercholesterolemia - at goal per last check in March (LDL 72).  continue current meds  Type II or unspecified type diabetes mellitus without mention of complication, not stated as uncontrolled - doesn't monitor (declines); encouraged weight loss, dietary changes including limiting juice, alcohol  Murmur - recent echo at [facility]--signing ROR and awaiting results.  valve thickening per pt's understanding, stable   Discussed alcohol--none versus  small amts.  Recommended 1/d with an occasional 2nd, although ideally none at all might be better for his attempts at weight loss and elevated TG.  We spent a lot of time discussing none vs 1-2/d, whether it would be easier to abstain completely and not have it at home, vs having to limit his intake (compared it to smoking, all vs none).  He elects to cut back rather than stop completely.  However, if TG remain >150 (along with hoping for changes in sugar and weight), then may need to stop completely.    HTN:  Increase the dose of lisinopril to a total of 30mg  daily (3 of the 10mg  tablets)--you can take this all together (probably best in the evening), or split it up.  Continue the metoprolol twice daily (simply because that it the way you have been doing it, not sure why).  If your blood pressures remain high after 1-2 weeks on the 30mg  lisinopril (ie >578'I systolic, with no low blood pressures  associated with dizziness), then increase to 40mg .    Mild carotid stenosis--will need recheck (6 mos vs 1 yr)  Offered neuro consult--declined  F/u in 4-5 weeks on BP's, and fasting lipids at visit to f/u on TG

## 2013-11-01 ENCOUNTER — Encounter: Payer: Self-pay | Admitting: Family Medicine

## 2013-11-12 ENCOUNTER — Encounter: Payer: Self-pay | Admitting: Family Medicine

## 2013-11-12 ENCOUNTER — Ambulatory Visit (INDEPENDENT_AMBULATORY_CARE_PROVIDER_SITE_OTHER): Payer: Medicare Other | Admitting: Family Medicine

## 2013-11-12 VITALS — BP 210/112 | HR 72 | Ht 70.0 in | Wt 171.0 lb

## 2013-11-12 DIAGNOSIS — G459 Transient cerebral ischemic attack, unspecified: Secondary | ICD-10-CM

## 2013-11-12 DIAGNOSIS — I10 Essential (primary) hypertension: Secondary | ICD-10-CM

## 2013-11-12 DIAGNOSIS — E7800 Pure hypercholesterolemia, unspecified: Secondary | ICD-10-CM | POA: Diagnosis not present

## 2013-11-12 DIAGNOSIS — E119 Type 2 diabetes mellitus without complications: Secondary | ICD-10-CM

## 2013-11-12 LAB — COMPREHENSIVE METABOLIC PANEL WITH GFR
ALT: 17 U/L (ref 0–53)
AST: 20 U/L (ref 0–37)
Albumin: 4.7 g/dL (ref 3.5–5.2)
Alkaline Phosphatase: 35 U/L — ABNORMAL LOW (ref 39–117)
BUN: 19 mg/dL (ref 6–23)
CO2: 27 meq/L (ref 19–32)
Calcium: 9.7 mg/dL (ref 8.4–10.5)
Chloride: 103 meq/L (ref 96–112)
Creat: 0.77 mg/dL (ref 0.50–1.35)
Glucose, Bld: 118 mg/dL — ABNORMAL HIGH (ref 70–99)
Potassium: 5 meq/L (ref 3.5–5.3)
Sodium: 141 meq/L (ref 135–145)
Total Bilirubin: 0.7 mg/dL (ref 0.2–1.2)
Total Protein: 7.2 g/dL (ref 6.0–8.3)

## 2013-11-12 LAB — LIPID PANEL
Cholesterol: 170 mg/dL (ref 0–200)
HDL: 60 mg/dL (ref >39)
LDL CALC: 78 mg/dL (ref 0–99)
TRIGLYCERIDES: 158 mg/dL — AB (ref <150)
Total CHOL/HDL Ratio: 2.8 Ratio
VLDL: 32 mg/dL (ref 0–40)

## 2013-11-12 LAB — POCT GLYCOSYLATED HEMOGLOBIN (HGB A1C): Hemoglobin A1C: 5.8

## 2013-11-12 NOTE — Patient Instructions (Signed)
Continue daily exercise--due to the heat, stay inside, doing 10-15 minutes at least 2-3 times/day.  Try and cut back on the sodium in your diet. Continue your same medications. Either return for office visit or mail your blood pressure log within 4-6 weeks.  See [place] immediately if having headaches or neurologic symptoms.  Remember to try and use the columns as shown (for different times of day and "comments" section.  Sodium-Controlled Diet Sodium is a mineral. It is found in many foods. Sodium may be found naturally or added during the making of a food. The most common form of sodium is salt, which is made up of sodium and chloride. Reducing your sodium intake involves changing your eating habits. The following guidelines will help you reduce the sodium in your diet:  Stop using the salt shaker.  Use salt sparingly in cooking and baking.  Substitute with sodium-free seasonings and spices.  Do not use a salt substitute (potassium chloride) without your caregiver's permission.  Include a variety of fresh, unprocessed foods in your diet.  Limit the use of processed and convenience foods that are high in sodium. USE THE FOLLOWING FOODS SPARINGLY: Breads/Starches  Commercial bread stuffing, commercial pancake or waffle mixes, coating mixes. Waffles. Croutons. Prepared (boxed or frozen) potato, rice, or noodle mixes that contain salt or sodium. Salted [place] fries or hash browns. Salted popcorn, breads, crackers, chips, or snack foods. Vegetables  Vegetables canned with salt or prepared in cream, butter, or cheese sauces. Sauerkraut. Tomato or vegetable juices canned with salt.  Fresh vegetables are allowed if rinsed thoroughly. Fruit  Fruit is okay to eat. Meat and Meat Substitutes  Salted or smoked meats, such as bacon or Canadian bacon, chipped or corned beef, hot dogs, salt pork, luncheon meats, pastrami, ham, or sausage. Canned or smoked fish, poultry, or meat. Processed cheese or  cheese spreads, blue or [place] cheese. Battered or frozen fish products. Prepared spaghetti sauce. Baked beans. Reuben sandwiches. Salted nuts. Caviar. Milk  Limit buttermilk to 1 cup per week. Soups and Combination Foods  Bouillon cubes, canned or dried soups, broth, consomm. Convenience (frozen or packaged) dinners with more than 600 mg sodium. Pot pies, pizza, Asian food, fast food cheeseburgers, and specialty sandwiches. Desserts and Sweets  Regular (salted) desserts, pie, commercial fruit snack pies, commercial snack cakes, canned puddings.  Eat desserts and sweets in moderation. Fats and Oils  Gravy mixes or canned gravy. No more than 1 to 2 tbs of salad dressing. Chip dips.  Eat fats and oils in moderation. Beverages  See those listed under the vegetables and milk groups. Condiments  Ketchup, mustard, meat sauces, salsa, regular (salted) and lite soy sauce or mustard. Dill pickles, olives, meat tenderizer. Prepared horseradish or pickle relish. Dutch-processed cocoa. Baking powder or baking soda used medicinally. Worcestershire sauce. "Light" salt. Salt substitute, unless approved by your caregiver. Document Released: 11/06/2001 Document Revised: 08/09/2011 Document Reviewed: 06/09/2009 [facility] Patient Information 2014 [place], [place].

## 2013-11-12 NOTE — Progress Notes (Signed)
Chief Complaint  Patient presents with  . Hypertension    fasting med check.  Just went form 30mg  of lisinopril to 40mg  this past Friday.    Hypertension:  He increased the lisinopril from 30mg /day to 40mg  (20mg  BID) just last week. BP's at home continue to be very variable, ranging from 110/57 up to 206/95.  They are usually higher in the morning, lower later in the day, often 130-160, only occasionally >180, and occasionally <125.  Pulse mostly 60-80 He denies any headaches, chest pain, side effects to medications. He has had no further TIA symptoms.  He admits to salty meal over the weekend (birthday celebration), but also having high salt snacks frequently at home.  Hyperlipidemia--last check in March showed LDL at goal, but TG still elevated.  Since then he has cut back on his alcohol to maximum 2 drinks/day.  Compliant with medications and diet.  Not as careful with his sweets as in the past; still following low cholesterol diet.  Diabetes--diet controlled.  Diet hasn't been quite as good recently re: sweets/sugars.  Denies polydipsia, polyuria, vision changes, skin lesions, neuropathy.  Recent TIA--he has had no recurrences.  He thinks it was related to him getting overheated/dehydrated, and has been more careful  Past Medical History  Diagnosis Date  . Hypertension   . Hypercholesteremia   . Impaired fasting glucose   . Prostate cancer radiation + seeding implant (Dr.[name])  . Rotator cuff tear, right 10/2010    supraspinatous and infraspinatous  . Diabetes mellitus   . Macular degeneration   . Heart murmur     mild aortic stenosis  . Radiation proctitis 11/2012    treated with APC ablation and Canasa suppositories (Dr. Hilarie Fredrickson)  . TIA (transient ischemic attack) 10/02/13    hosp at [facility] x 1 night   Past Surgical History  Procedure Laterality Date  . Tonsillectomy and adenoidectomy  age 20  . Prostate seed implant    . Rotator cuff repair  06/16/2011    right (Dr. Berenice Primas)  .  Colonoscopy N/A 11/28/2012    Procedure: COLONOSCOPY;  Surgeon: Jerene Bears, MD;  Location: [facility];  Service: Gastroenterology;  Laterality: N/A;   History   Social History  . Marital Status: Married    Spouse Name: N/A    Number of Children: 4  . Years of Education: N/A   Occupational History  . Retired    Social History Main Topics  . Smoking status: Former Smoker -- 1.00 packs/day for 20 years    Types: Cigarettes    Quit date: 05/31/1980  . Smokeless tobacco: Never Used  . Alcohol Use: 1.2 oz/week    2 Cans of beer per week     Comment: around 2 beers daily as max since hospitalizaton 5/15.  . Drug Use: No  . Sexual Activity: Yes   Other Topics Concern  . Not on file   Social History Narrative   Retired Engineer, structural.  2 sons, 2 daughters (all in [place]), 15 grandchildren, 1 great grandchild   Outpatient Encounter Prescriptions as of 11/12/2013  Medication Sig Note  . Acetaminophen (TYLENOL ARTHRITIS PAIN PO) Take 2 tablets by mouth 2 (two) times daily.     [name] aspirin 325 MG tablet Take 325 mg by mouth daily.   [name] lisinopril (PRINIVIL,ZESTRIL) 10 MG tablet Take 3 tablets once daily, increase to 4/day if BP's remain >140-150. 11/12/2013: Taking 20mg  twice daily for the last 3-4 days  . lovastatin (MEVACOR) 20 MG  tablet Take 2 tablets (40 mg total) by mouth at bedtime.   . metoprolol succinate (TOPROL-XL) 50 MG 24 hr tablet TAKE 1 TABLET BY MOUTH 2 TIMES DAILY (TAKE WITH OR IMMEDIATELY FOLLOWING A MEAL)   . Multiple Vitamins-Minerals (EYE VITAMINS PO) Take 1 tablet by mouth daily. Patient states vitamin is actually called "pressure vision," supplement for macular degeneration.   [name] OVER THE COUNTER MEDICATION Sleep aid, takes it most nights    No Known Allergies  ROS:  Denies headaches, dizziness, numbness/tingling/weakness, speech problems or any other neuro symptoms.  No chest pain, palpitations, shortness of breath, URI symptoms, cough, fevers, GI or GU complaints,  bleeding/bruising, rashes or other concerns.  PHYSICAL EXAM: BP 210/112  Pulse 72  Ht 5\' 10"  (1.778 m)  Wt 171 lb (77.565 kg)  BMI 24.54 kg/m2 202/90 on repeat by MD Pleasant male, slightly anxious/excitable, in no distress HEENT:  PERRL, EOMI, conjunctiva clear. TM's and EAC's normal.  OP clear Neck: no lymphadenopathy, thyromegaly or mass.  No carotid bruit Heart: regular rate and rhythm, with heart murmur, unchanged..  Frequent ectopy noted Lungs: clear bilaterally Abdomen: soft, no organomegaly or mass, nontender,  Normal bowel sounds. +abdominal obesity. Extremities: no edema, 2+ pulses Psych: normal mood, affect, hygiene and grooming Neuro: alert and oriented. normal strength, gait, cranial nerves intact.   Lab Results  Component Value Date   HGBA1C 5.8 11/12/2013   ASSESSMENT/PLAN:  Essential hypertension, benign - suboptimally controlled; high salt intake--reviewed low sodium diet.  Increase exercise. Cont current meds. Known white coat HTN component. - Plan: Comprehensive metabolic panel  Type II or unspecified type diabetes mellitus without mention of complication, not stated as uncontrolled - diet controlled. some recent indiscretions.  Discussed proper diet - Plan: HgB A1c, Comprehensive metabolic panel  Pure hypercholesterolemia - LDL at goal on last check, but TG high.  reviewed proper diet.   - Plan: Comprehensive metabolic panel, Lipid panel  TIA (transient ischemic attack) - no recurrent problems.  Clearly BP needs to come down   Only recently increased lisinopril dose, and admits to high sodium diet (snacks)  Continue on current meds, working to lower sodium intake in the diet.  If BP's remain elevated, consider adding low dose amlodipine vs increasing beta blocker.  Continue monitoring at home.  Shown how to have columns for time of day and comments section in his log.  Mail/fax/email BP values in 4-6 weeks (vs OV, pt choice)  Consider changing lisinopril to  20mg  dose when refill needed (20BID).  Doesn't need rx currently.  Seek immediate eval if headaches, chest pain, recurrent neuro symptoms.  Reviewed healthy, low sodium diet. Continue no more than 2 drinks/day, encouraged less for weight loss, and if TG remains high.  F/u as scheduled in August, but need to review BP's prior to that

## 2013-11-19 DIAGNOSIS — K6289 Other specified diseases of anus and rectum: Secondary | ICD-10-CM | POA: Diagnosis not present

## 2013-11-19 DIAGNOSIS — Z8546 Personal history of malignant neoplasm of prostate: Secondary | ICD-10-CM | POA: Diagnosis not present

## 2013-11-19 DIAGNOSIS — R351 Nocturia: Secondary | ICD-10-CM | POA: Diagnosis not present

## 2013-11-19 DIAGNOSIS — C61 Malignant neoplasm of prostate: Secondary | ICD-10-CM | POA: Diagnosis not present

## 2013-11-22 DIAGNOSIS — H353190 Nonexudative age-related macular degeneration, unspecified eye, stage unspecified: Secondary | ICD-10-CM | POA: Diagnosis not present

## 2013-12-01 ENCOUNTER — Other Ambulatory Visit: Payer: Self-pay | Admitting: Family Medicine

## 2013-12-05 ENCOUNTER — Telehealth: Payer: Self-pay | Admitting: Family Medicine

## 2013-12-05 NOTE — Telephone Encounter (Signed)
Pt dropped off bp readings. I am sending back in folder.

## 2013-12-05 NOTE — Telephone Encounter (Signed)
BP's from 6/16-7/7 were dropped off.  BP's range from 91/50 to 180's/90's.  Many in the 616'O-372 systolic range, with outliers above and below.  They tended to be lower after exercise/yardwork.  Given the range, no changes in meds are recommended at this point.  Please advise pt that they were reviewed, and of above recommendation (no changes)

## 2013-12-06 NOTE — Telephone Encounter (Signed)
Patient advised and verbalized understanding 

## 2013-12-12 DIAGNOSIS — L02519 Cutaneous abscess of unspecified hand: Secondary | ICD-10-CM | POA: Diagnosis not present

## 2013-12-12 DIAGNOSIS — L03019 Cellulitis of unspecified finger: Secondary | ICD-10-CM | POA: Diagnosis not present

## 2013-12-24 ENCOUNTER — Ambulatory Visit (INDEPENDENT_AMBULATORY_CARE_PROVIDER_SITE_OTHER): Payer: Medicare Other | Admitting: Family Medicine

## 2013-12-24 ENCOUNTER — Encounter: Payer: Self-pay | Admitting: Family Medicine

## 2013-12-24 VITALS — BP 144/90 | HR 84 | Ht 70.0 in | Wt 165.0 lb

## 2013-12-24 DIAGNOSIS — R5383 Other fatigue: Secondary | ICD-10-CM | POA: Diagnosis not present

## 2013-12-24 DIAGNOSIS — R5381 Other malaise: Secondary | ICD-10-CM | POA: Diagnosis not present

## 2013-12-24 DIAGNOSIS — G459 Transient cerebral ischemic attack, unspecified: Secondary | ICD-10-CM

## 2013-12-24 DIAGNOSIS — I10 Essential (primary) hypertension: Secondary | ICD-10-CM | POA: Diagnosis not present

## 2013-12-24 LAB — CBC WITH DIFFERENTIAL/PLATELET
Basophils Absolute: 0.1 10*3/uL (ref 0.0–0.1)
Basophils Relative: 1 % (ref 0–1)
Eosinophils Absolute: 0.1 10*3/uL (ref 0.0–0.7)
Eosinophils Relative: 1 % (ref 0–5)
HEMATOCRIT: 39.6 % (ref 39.0–52.0)
Hemoglobin: 13.6 g/dL (ref 13.0–17.0)
Lymphocytes Relative: 30 % (ref 12–46)
Lymphs Abs: 2.2 10*3/uL (ref 0.7–4.0)
MCH: 32.5 pg (ref 26.0–34.0)
MCHC: 34.3 g/dL (ref 30.0–36.0)
MCV: 94.5 fL (ref 78.0–100.0)
MONOS PCT: 10 % (ref 3–12)
Monocytes Absolute: 0.7 10*3/uL (ref 0.1–1.0)
NEUTROS ABS: 4.2 10*3/uL (ref 1.7–7.7)
Neutrophils Relative %: 58 % (ref 43–77)
Platelets: 397 10*3/uL (ref 150–400)
RBC: 4.19 MIL/uL — ABNORMAL LOW (ref 4.22–5.81)
RDW: 13 % (ref 11.5–15.5)
WBC: 7.2 10*3/uL (ref 4.0–10.5)

## 2013-12-24 NOTE — Patient Instructions (Signed)
  Restart lisinopril at 20mg  daily (either taken at once, or split twice a day, just be consistent). Increase back to 20mg  twice daily if blood pressures are above 150-160 at any point during the day (as long as it isn't also less than 314 systolic at other times of the day)  Message [place] with your list of BP's in about a month, sooner if you have any questions or concerns. Please don't make adjustments to your medications without contact us.  We are referring you to neurologist.  You must increase your fluid intake (non-caffeinated, non-alcoholic beverages)--at least 6-8 glasses/day, and an extra glass for every beer or caffeinated drink you have. Be especially careful to keep yourself well hydrated prior to being outside in the heat.  Cut back to 1 drink/day

## 2013-12-24 NOTE — Progress Notes (Signed)
Chief Complaint  Patient presents with  . Advice Only    Sunday July 19th they were outside in the heat just standing there. Wife states that a blank look came upon his face and he seemed to not be able to communicate or understand what she was saying to him, took babystep to car-did not fall.    Out in the heat 8 days ago for about 10-15 minutes (looking at son's camper--stuffy inside, as well as being hot outside).  It was in the 90's outside.  While standing and talking to his wife, his expression went blank, like "he was looking through you".  Could hardly pick his feet up to walk to the car (family assisted him), didn't say much, mumbling some.  Patient didn't realize that anything was wrong.  He doesn't recall that he wouldn't let his wife drive, wanting to take the keys back from her (wife drove)--he felt alert, but had some memory loss of the events.  The car remained hot [facility] wasn't working) for 20 minute ride.  Patient felt alert, oriented, but wife states that he still was a little bit off.  Once he got in the house, he cooled off, drank water, and then was fine.  BP was 140/? When he arrived home.  He reports that his "BP's have been good."  He had been taking 20mg  BID of lisinopril since last visit (using 10mg  tablets).  Last week he started taking 10mg  BID, having cut back (on his own) because his BP's had been 98-118/60's on the low end. He was still having highs of 150's in the mornings (always higher in the morning, lower later in the day, history of large fluctuations in his BP). He was feeling a little swimmyheaded with the lower BP's, especially when he stood up.  And also felt fatigued (but he was also on antibiotics at the same time, made him nauseated).  So, he cut back to 10mg  BID, and last week his BP's still seemed "too low at times" for his liking, so he cut out the lisinopril all together about a week ago. He only stayed at the 10mg  BID for just a couple of days.  Since stopping  the lisinopril BP's have been 130's first thing in the morning, 128/60's after his morning walk.  By bedtime, BP's are back up to 150-160 in the evenings.  He had a recent hand infection and was put on antibiotics.  It made him nauseated, so he only took 7-8 out of the 10 days prescribed. Redness and pain resolved.  He was hospitalized 5/4-09/2013 at [facility] with TIA symptoms. Symptoms were similar to this event-- which at the time was described as: trouble focusing his vision--that was description from wife, who felt like he was "looking through me". He denied any visual difficulty. He had trouble with his speech, word finding. Symptoms lasted 10-15 mins (per pt; wife reports that symptoms lasted longer, about 45-60 mins, still not back to normal at arrival to ER).  He had CT, MRI, carotid u/s, CXR and echocardiogram. Echo showed some mild concentric LVH, only mild leaky valves, normal EF.  No significant findings.  He had stopped alcohol completely as per recommendations from the hospital.  He wasn't happy about that, so I had told him that 1/day would be okay (occasionally a second, at most);  He reports he is back to drinking 2-3 beers/day.  He is exercising more regularly and has lost weight.  He admits to not drinking  very much water.  He is complaining of having no energy, tires very easily, ever since he was in the hospital in May. Denies any shortness of breath. Sleeps fine.  No snoring or apnea.  Up to bathroom 3-4 times/night (more since back to drinking more beer).  Past Medical History  Diagnosis Date  . Hypertension   . Hypercholesteremia   . Impaired fasting glucose   . Prostate cancer radiation + seeding implant (Dr.[name])  . Rotator cuff tear, right 10/2010    supraspinatous and infraspinatous  . Diabetes mellitus   . Macular degeneration   . Heart murmur     mild aortic stenosis  . Radiation proctitis 11/2012    treated with APC ablation and Canasa suppositories (Dr.  Hilarie Fredrickson)  . TIA (transient ischemic attack) 10/02/13    hosp at [facility] x 1 night   Past Surgical History  Procedure Laterality Date  . Tonsillectomy and adenoidectomy  age 25  . Prostate seed implant    . Rotator cuff repair  06/16/2011    right (Dr. Berenice Primas)  . Colonoscopy N/A 11/28/2012    Procedure: COLONOSCOPY;  Surgeon: Jerene Bears, MD;  Location: [facility];  Service: Gastroenterology;  Laterality: N/A;   History   Social History  . Marital Status: Married    Spouse Name: N/A    Number of Children: 4  . Years of Education: N/A   Occupational History  . Retired    Social History Main Topics  . Smoking status: Former Smoker -- 1.00 packs/day for 20 years    Types: Cigarettes    Quit date: 05/31/1980  . Smokeless tobacco: Never Used  . Alcohol Use: 1.2 oz/week    2 Cans of beer per week     Comment: around 2-3 beers daily as max since hospitalizaton 5/15.  . Drug Use: No  . Sexual Activity: Yes   Other Topics Concern  . Not on file   Social History Narrative   Retired Engineer, structural.  2 sons, 2 daughters (all in [place]), 15 grandchildren, 1 great grandchild   Outpatient Encounter Prescriptions as of 12/24/2013  Medication Sig Note  . Acetaminophen (TYLENOL ARTHRITIS PAIN PO) Take 2 tablets by mouth 2 (two) times daily.     [name] aspirin 325 MG tablet Take 325 mg by mouth daily.   [name] lovastatin (MEVACOR) 20 MG tablet Take 2 tablets (40 mg total) by mouth at bedtime.   . metoprolol succinate (TOPROL-XL) 50 MG 24 hr tablet TAKE 1 TABLET BY MOUTH 2 TIMES DAILY (TAKE WITH OR IMMEDIATELY FOLLOWING A MEAL)   . Multiple Vitamins-Minerals (EYE VITAMINS PO) Take 1 tablet by mouth daily. Patient states vitamin is actually called "pressure vision," supplement for macular degeneration.   [name] OVER THE COUNTER MEDICATION Sleep aid, takes it most nights   . lisinopril (PRINIVIL,ZESTRIL) 10 MG tablet TAKE 3 TABLETS ONCE DAILY, INCREASE TO 4/DAY IF BP'S REMAIN >140-150. 12/24/2013: Stopped taking a  week ago (on his own)   No Known Allergies  ROS:  Denies fevers, chills, headaches, dizziness, chest pain.  Nausea from antibiotics has resolved, denies diarrhea.  No bleeding/bruising, rashes. Currently no neuro symptoms, just generalized fatigue.  No URI symptoms, shortness of breath. Weight loss of 6 pounds since last visit (intentional).  PHYSICAL EXAM: BP 144/90  Pulse 84  Ht 5\' 10"  (1.778 m)  Wt 165 lb (74.844 kg)  BMI 23.68 kg/m2 160/100 on repeat by MD Well developed, pleasant male, in no distress.  Somewhat irritable/upset  when I recommend he further cut back on his alcohol (stating he is worried that I will tell him to stop entirely) HEENT:  PERRL, EOMI, conjunctiva clear Neck: no lymphadenopathy, thyromegaly.  There is mild bruit noted, likely radiating from loud murmur (bilateral) Heart: regular rate and rhythm without ectopy. Murmur, loudest at RUSB (radiating to both carotids, quiet in carotids) Lungs: clear bilaterally  Abdomen: soft, no organomegaly or mass, nontender, Normal bowel sounds. Extremities: no edema, 2+ pulses  Psych: normal mood, affect, hygiene and grooming  Neuro: alert and oriented. normal strength, gait, cranial nerves intact.   ASSESSMENT/PLAN:  Transient cerebral ischemia, unspecified transient cerebral ischemia type - possible recurrence,vs heat-related symptoms.  consider seizure (absence?).  Recommend neuro eval--refer. If truly felt to be TIA, while on ASA, may need Plavix - Plan: Ambulatory referral to Neurology  Essential hypertension, benign - suboptimally controlled.  He discontinued med on his own due to some low BP's that were minimally symptomatic (more likely related to hydration status)  Other malaise and fatigue - r/o anemia (newly started on ASA since last CBC, since TIA); check testosterone level as other cause, although wouldn't replace given his prostate CA  - Plan: CBC with Differential, Testosterone  Restart lisinopril at 20mg   daily (either taken at once, or split twice a day, just be consistent). Increase back to 20mg  twice daily if blood pressures are above 150-160 at any point during the day (as long as it isn't also less than 409 systolic at other times of the day)  Message [place] with your list of BP's in about a month, sooner if you have any questions or concerns. Please don't make adjustments to your medications without contact us.  We are referring you to neurologist.  You must increase your fluid intake (non-caffeinated, non-alcoholic beverages)--at least 6-8 glasses/day, and an extra glass for every beer or caffeinated drink you have. Be especially careful to keep yourself well hydrated prior to being outside in the heat.  Cut back to 1 drink/day

## 2013-12-25 ENCOUNTER — Encounter: Payer: Self-pay | Admitting: Family Medicine

## 2013-12-25 DIAGNOSIS — R7989 Other specified abnormal findings of blood chemistry: Secondary | ICD-10-CM | POA: Insufficient documentation

## 2013-12-25 LAB — TESTOSTERONE: Testosterone: 226 ng/dL — ABNORMAL LOW (ref 300–890)

## 2013-12-26 ENCOUNTER — Ambulatory Visit (INDEPENDENT_AMBULATORY_CARE_PROVIDER_SITE_OTHER): Payer: Medicare Other | Admitting: Neurology

## 2013-12-26 ENCOUNTER — Encounter: Payer: Self-pay | Admitting: Neurology

## 2013-12-26 VITALS — BP 122/72 | HR 70 | Temp 98.0°F | Resp 18 | Ht 71.0 in | Wt 169.2 lb

## 2013-12-26 DIAGNOSIS — R404 Transient alteration of awareness: Secondary | ICD-10-CM | POA: Diagnosis not present

## 2013-12-26 DIAGNOSIS — G459 Transient cerebral ischemic attack, unspecified: Secondary | ICD-10-CM | POA: Diagnosis not present

## 2013-12-26 NOTE — Progress Notes (Signed)
NEUROLOGY CONSULTATION NOTE  [name] MRN: [id number] DOB: [birth year]  Referring provider: Dr. Rita Ohara Primary care provider: Dr. Rita Ohara  Reason for consult:  Episodes of staring and unresponsiveness  Dear Dr Tomi Bamberger:  Thank you for your kind referral of Jeffrey [name] for consultation of the above symptoms. Although his history is well known to you, please allow me to reiterate it for the purpose of our medical record. The patient was accompanied to the clinic by his wife who also provides collateral information. Records and images were personally reviewed where available.  HISTORY OF PRESENT ILLNESS: This is a pleasant 80 year old right-handed man with a history of hypertension, hyperlipidemia, prostate cancer, presenting for evaluation of recurrent episodes of staring and unresponsiveness. The first episode occurred summer 2014, he was at the beach and reports it was hot, he was working on the deck with his family, when he stopped communicating and had a dead stare.  They assisted him into the house and sat him down, called EMS and was diagnosed with dehydration.  The episode lasted a few minutes, he recalls knowing that something was wrong when he was being assisted into the house, but denied feeling any headache, dizziness, or focal symptoms.  The second episode occurred on 10/01/2013, he was at home with his wife, took a nap outside under the sun, when his wife came out to walk with him, he realized he couldn't talk right, speech would not come out, he wanted to say something and something else would come out.  His wife reports he appeared "like he was looking through me."  He was not answering her questions, with nonsensical speech.  The episode lasted around 15 minutes, he was brought to [facility] and discharged with a diagnosis of TIA and malignant hypertension. He was started on full dose aspirin. Results from studies done reviewed, MRI brain showed mild chronic small vessel changes in  the pons, few old small vessel cerebellar infarctions, moderate chronic small vessel changes in the deep and subcortical white matter. Carotid dopplers showed bilateral carotid bifurcation and proximal ICA plaque resulting in less than 50% diameter stenosis, this exam does not exclude plaque ulceration or embolization.  Echo showed mild concentric LVH, normal EF, left atrium normal. Lipid panel showed normal LDL of  49.  The third episode occurred on 12/16/13, he was again outside and just came out of his son's hot and stuffy camper, he was standing with his family then was described to be "sort of in a stupor."  His wife reported the same blank expression. They assisted him to the car, he recalls this but does not recall arguing with his wife about wanting to take the keys to drive.  He was asking why he was being put in the backseat, he does not recall this.  The episode lasted 15 minutes, his wife denied any automatisms or twitching. The patient denied having any headaches or dizziness.  He checked his BP and noted it was pretty good.  He denies any other episodes of gaps in time, no olfactory/gustatory hallucinations, deja vu, rising epigastric sensation, focal numbness/tingling/weakness, myoclonic jerks.  He has occasional cramping in his left hand or foot.  He has occasional dizziness if he stands up quickly. Otherwise he denies any headaches, diplopia, dysarthria, dysphagia, neck/back pain, bowel/bladder dysfunction.   He had a normal birth and early development.  There is no history of febrile convulsions, CNS infections such as meningitis/encephalitis, significant traumatic brain injury, neurosurgical  procedures, or family history of seizures. His sister had mini-strokes.  PAST MEDICAL HISTORY: Past Medical History  Diagnosis Date  . Hypertension   . Hypercholesteremia   . Impaired fasting glucose   . Prostate cancer radiation + seeding implant (Dr.[name])  . Rotator cuff tear, right 10/2010     supraspinatous and infraspinatous  . Diabetes mellitus   . Macular degeneration   . Heart murmur     mild aortic stenosis  . Radiation proctitis 11/2012    treated with APC ablation and Canasa suppositories (Dr. Hilarie Fredrickson)  . TIA (transient ischemic attack) 10/02/13    hosp at [facility] x 1 night    PAST SURGICAL HISTORY: Past Surgical History  Procedure Laterality Date  . Tonsillectomy and adenoidectomy  age 28  . Prostate seed implant    . Rotator cuff repair  06/16/2011    right (Dr. Berenice Primas)  . Colonoscopy N/A 11/28/2012    Procedure: COLONOSCOPY;  Surgeon: Jerene Bears, MD;  Location: [facility];  Service: Gastroenterology;  Laterality: N/A;    MEDICATIONS: Current Outpatient Prescriptions on File Prior to Visit  Medication Sig Dispense Refill  . Acetaminophen (TYLENOL ARTHRITIS PAIN PO) Take 2 tablets by mouth 2 (two) times daily.        [name] aspirin 325 MG tablet Take 325 mg by mouth daily.      [name] lisinopril (PRINIVIL,ZESTRIL) 10 MG tablet TAKE 3 TABLETS ONCE DAILY, INCREASE TO 4/DAY IF BP'S REMAIN >140-150.  270 tablet  0  . lovastatin (MEVACOR) 20 MG tablet Take 2 tablets (40 mg total) by mouth at bedtime.  180 tablet  1  . metoprolol succinate (TOPROL-XL) 50 MG 24 hr tablet TAKE 1 TABLET BY MOUTH 2 TIMES DAILY (TAKE WITH OR IMMEDIATELY FOLLOWING A MEAL)  180 tablet  1  . Multiple Vitamins-Minerals (EYE VITAMINS PO) Take 1 tablet by mouth daily. Patient states vitamin is actually called "pressure vision," supplement for macular degeneration.      [name] OVER THE COUNTER MEDICATION Sleep aid, takes it most nights       No current facility-administered medications on file prior to visit.    ALLERGIES: No Known Allergies  FAMILY HISTORY: Family History  Problem Relation Age of Onset  . Heart disease Father 41    Died suddenly.  No diagnosis  . Stroke Sister   . Dementia Mother   . Diabetes Mother   . Cancer Daughter     ovarian (?) vs other male cancer; s/p hyst doing well  . Heart  disease Son     congestive heart failure--improved    SOCIAL HISTORY: History   Social History  . Marital Status: Married    Spouse Name: N/A    Number of Children: 4  . Years of Education: N/A   Occupational History  . Retired    Social History Main Topics  . Smoking status: Former Smoker -- 1.00 packs/day for 20 years    Types: Cigarettes    Quit date: 05/31/1980  . Smokeless tobacco: Never Used  . Alcohol Use: 1.2 oz/week    2 Cans of beer per week     Comment: around 2-3 beers daily as max since hospitalizaton 5/15.  . Drug Use: No  . Sexual Activity: Yes    Partners: Female   Other Topics Concern  . Not on file   Social History Narrative   Retired Engineer, structural.  2 sons, 2 daughters (all in [place]), 15 grandchildren, 1 great grandchild  REVIEW OF SYSTEMS: Constitutional: No fevers, chills, or sweats, no generalized fatigue, change in appetite Eyes: No visual changes, double vision, eye pain Ear, nose and throat: No hearing loss, ear pain, nasal congestion, sore throat Cardiovascular: No chest pain, palpitations Respiratory:  No shortness of breath at rest or with exertion, wheezes GastrointestinaI: No nausea, vomiting, diarrhea, abdominal pain, fecal incontinence Genitourinary:  No dysuria, urinary retention or frequency Musculoskeletal:  No neck pain, back pain Integumentary: No rash, pruritus, skin lesions Neurological: as above Psychiatric: No depression, insomnia, anxiety Endocrine: No palpitations, fatigue, diaphoresis, mood swings, change in appetite, change in weight, increased thirst Hematologic/Lymphatic:  No anemia, purpura, petechiae. Allergic/Immunologic: no itchy/runny eyes, nasal congestion, recent allergic reactions, rashes  PHYSICAL EXAM: Filed Vitals:   12/26/13 0747  BP: 122/72  Pulse: 70  Temp: 98 F (36.7 C)  Resp: 18   General: No acute distress Head:  Normocephalic/atraumatic Eyes: Fundoscopic exam shows bilateral sharp discs, no  vessel changes, exudates, or hemorrhages Neck: supple, no paraspinal tenderness, full range of motion Back: No paraspinal tenderness Heart: regular rate and rhythm Lungs: Clear to auscultation bilaterally. Vascular: No carotid bruits. Skin/Extremities: No rash, no edema Neurological Exam: Mental status: alert and oriented to person, place, and time, no dysarthria or aphasia, Fund of knowledge is appropriate.  Recent and remote memory are intact.  Attention and concentration are normal.    Able to name objects and repeat phrases. Cranial nerves: CN I: not tested CN II: pupils equal, round and reactive to light, visual fields intact, fundi unremarkable. CN III, IV, VI:  full range of motion, no nystagmus, no ptosis CN V: facial sensation intact CN VII: upper and lower face symmetric CN VIII: hearing intact to finger rub CN IX, X: gag intact, uvula midline CN XI: sternocleidomastoid and trapezius muscles intact CN XII: tongue midline Bulk & Tone: normal, no cogwheeling, no fasciculations. Motor: 5/5 throughout with no pronator drift. Sensation: decreased vibration up to left knee. Otherwise intact to light touch, cold, pin, and joint position sense.  No extinction to double simultaneous stimulation.  Romberg test negative Deep Tendon Reflexes: +2 throughout except for +1 left ankle, no ankle clonus Plantar responses: downgoing bilaterally Cerebellar: no incoordination on finger to nose testing Gait: narrow-based and steady, able to tandem walk adequately. Tremor: no resting tremor. There is a low amplitude high frequency postural and action tremor noted in both hands.  IMPRESSION: This is a pleasant 80 year old right-handed man with a history of hypertension, hyperlipidemia, prostate cancer, presenting with 3 stereotyped episodes of blank stare and unresponsiveness that he is amnestic of, with no warning symptoms.  No focal symptoms reported.  MRI brain at [facility] during the second episode did  not show any acute changes. Considerations include partial seizures, TIA, less likely heat-related, although he reports all three of these occurred in very hot weather.  The stereotyped nature is concerning, though, and would need further evaluation.  Routine EEG will be ordered to assess for focal abnormalities that increase risk for recurrent seizures. If normal, a 24-hour EEG will be done to further classify his symptoms. Disc of MRI done at [facility] will be requested for review. Continue control of vascular risk factors and full dose aspirin, reduce alcohol use. We discussed  driving laws, he knows to stop driving after an episode of loss of awareness, until 6 months event-free. He will follow-up after the tests.  Thank you for allowing me to participate in the care of this patient. Please do  not hesitate to call for any questions or concerns.   Ellouise Newer, M.D.  CC: Dr. Tomi Bamberger

## 2013-12-26 NOTE — Patient Instructions (Signed)
1. Routine EEG, then plan for 24-hour EEG 2. We will obtain discs of MRI studies done at [facility] 3. As per [place] driving laws, after an episode of loss of awareness/consciousness, one should not drive until 6 months event-free

## 2014-01-01 ENCOUNTER — Ambulatory Visit (INDEPENDENT_AMBULATORY_CARE_PROVIDER_SITE_OTHER): Payer: Medicare Other | Admitting: Neurology

## 2014-01-01 DIAGNOSIS — R404 Transient alteration of awareness: Secondary | ICD-10-CM | POA: Diagnosis not present

## 2014-01-03 ENCOUNTER — Telehealth: Payer: Self-pay | Admitting: Neurology

## 2014-01-03 NOTE — Telephone Encounter (Signed)
Pt would like the results of the EEG test he had done please call [phone]

## 2014-01-03 NOTE — Telephone Encounter (Signed)
Patient advised that Dr. Delice Lesch is out off the office and we will get back with him early part of next week

## 2014-01-07 ENCOUNTER — Telehealth: Payer: Self-pay | Admitting: Neurology

## 2014-01-07 NOTE — Telephone Encounter (Signed)
Pt needs to talk to someone please call [phone]

## 2014-01-07 NOTE — Telephone Encounter (Signed)
Please review

## 2014-01-07 NOTE — Telephone Encounter (Signed)
I spoke with patient. He was wanting to know the results of his EMG. I did explain to him that Dr. Delice Lesch was out of the office last week and today was her 1st day back. Once she reviews results from last week we will give him a call to let him know.

## 2014-01-07 NOTE — Telephone Encounter (Signed)
Discussed EEG findings showing left temporal slowing, non-specific, but episodes are likely not heat stroke-related with the abnormal EEG. Would proceed with prolonged EEG for further classification. We again discussed [place] driving laws that if one had loss of awareness, he should not drive until 6 months event-free. He expressed understanding.

## 2014-01-07 NOTE — Procedures (Signed)
ELECTROENCEPHALOGRAM REPORT  Date of Study: 01/01/2014  Patient's Name: Jeffrey [name] MRN: [id number] Date of Birth: 05-24-1933  Referring Provider: Dr. Ellouise Newer  Clinical History: This is a 80 year old man with 3 stereotyped episodes of blank stare and unresponsiveness that he is amnestic of, with no warning symptoms  Medications: Aspirin, lovastatin, Lipitor, Metoprolol  Technical Summary: A multichannel digital EEG recording measured by the international 10-20 system with electrodes applied with paste and impedances below 5000 ohms performed in our laboratory with EKG monitoring in an awake and asleep patient.  Hyperventilation and photic stimulation were performed.  The digital EEG was referentially recorded, reformatted, and digitally filtered in a variety of bipolar and referential montages for optimal display.    Description: The patient is awake and asleep during the recording.  During maximal wakefulness, there is a symmetric, medium voltage 9-9/5 Hz posterior dominant rhythm that attenuates with eye opening.  There is occasional focal 4-5 Hz theta slowing seen over the left temporal region.  During drowsiness and stage I sleep, there is an increase in theta slowing of the background, with shifting asymmetry over the bilateral temporal regions.  Occasional vertex waves were seen.  Hyperventilation and photic stimulation did not elicit any abnormalities.  There were no epileptiform discharges or electrographic seizures seen.    EKG lead was unremarkable.  Impression: This awake and asleep EEG is abnormal due to occasional focal slowing over the left temporal region.  Clinical Correlation of the above findings indicates focal cerebral dysfunction over the left temporal region suggestive of underlying structural or physiologic abnormality.  The absence of epileptiform discharges does not rule out a clinical diagnosis of epilepsy.  If further clinical questions remain, prolonged EEG  may be helpful.  Clinical correlation is advised.  Ellouise Newer, M.D.

## 2014-01-14 ENCOUNTER — Ambulatory Visit (INDEPENDENT_AMBULATORY_CARE_PROVIDER_SITE_OTHER): Payer: Medicare Other | Admitting: Family Medicine

## 2014-01-14 ENCOUNTER — Encounter: Payer: Self-pay | Admitting: Family Medicine

## 2014-01-14 VITALS — BP 152/90 | HR 80 | Ht 70.0 in | Wt 168.0 lb

## 2014-01-14 DIAGNOSIS — I10 Essential (primary) hypertension: Secondary | ICD-10-CM

## 2014-01-14 DIAGNOSIS — R404 Transient alteration of awareness: Secondary | ICD-10-CM

## 2014-01-14 DIAGNOSIS — E7800 Pure hypercholesterolemia, unspecified: Secondary | ICD-10-CM | POA: Diagnosis not present

## 2014-01-14 DIAGNOSIS — E119 Type 2 diabetes mellitus without complications: Secondary | ICD-10-CM

## 2014-01-14 DIAGNOSIS — Z0000 Encounter for general adult medical examination without abnormal findings: Secondary | ICD-10-CM

## 2014-01-14 DIAGNOSIS — E291 Testicular hypofunction: Secondary | ICD-10-CM

## 2014-01-14 DIAGNOSIS — R809 Proteinuria, unspecified: Secondary | ICD-10-CM | POA: Diagnosis not present

## 2014-01-14 DIAGNOSIS — R7989 Other specified abnormal findings of blood chemistry: Secondary | ICD-10-CM

## 2014-01-14 LAB — POCT URINALYSIS DIPSTICK
Bilirubin, UA: NEGATIVE
Glucose, UA: NEGATIVE
Leukocytes, UA: NEGATIVE
Nitrite, UA: NEGATIVE
PH UA: 7
Spec Grav, UA: 1.01
Urobilinogen, UA: NEGATIVE

## 2014-01-14 MED ORDER — LOVASTATIN 20 MG PO TABS: 40.0000 mg | ORAL_TABLET | Freq: Every day | ORAL | Status: DC

## 2014-01-14 NOTE — Progress Notes (Signed)
Chief Complaint  Patient presents with  . AWV    fasting annual wellness/med check. Wanted to discuss his testosterone with you, would like some advice on what to ask Dr.[name]. Had EEG with Dr.Aquino, she did call him with results but feels like results were unclear, would you please review and re-address if you are able to. (under procedure tab)   Jeffrey [name] is a 80 y.o. male who presents for follow-up on his chronic medical problems, as well as Annual Wellness Visit.  Episodes of unresponsiveness with blank stare.  He saw neuro (Dr. Delice Lesch) last month, and had EEG earlier this month. EEG findings showing left temporal slowing, non-specific, but episodes are likely not heat stroke-related with the abnormal EEG. Would proceed with prolonged EEG for further classification (scheduled for September).  He is aware that he has been told re: risks of driving.  HTN--BP's tend to be higher when he first wakes up, then gradually drops after taking morning meds.  Lowest BP's are early afternoon.  Blood pressure is lowest after exercise. BPs range from 91-182 (lowest around noon, and/or after exercise)/48-80, pulse 68-83.  Yesterday at noon was 182/73 (deviates from usual pattern), repeat was 154/71 3 hrs later. Denies headaches.  He does have some dizziness when BP's are low--he moves slower, tries to drink some water (doesn't like water, wife makes him drink it).  He feels better within 10-15 minutes.  Hyperlipidemia follow-up:  Patient is reportedly following a low-fat, low cholesterol diet.  Compliant with medications and denies medication side effects  DM/IFG--He doesn't check his sugars.  Thinks they would be higher now than usual--he raises figs, and has been eating a lot.  Tries to avoid sweets, candy.  He orders sweet tea when he eats out.  He goes to the eye doctor every six months.  He denies any feet concerns, numbness/tingling.  He continues to drink 2-3 beers/d (strongly advised to just have 1/d  by me at prior visits).  Low testosterone--he contacted [name] about this, and has an appointment later this month to discuss it.  AWV:  Other doctors caring for pt:  GI--Dr. Hilarie Fredrickson  Urologist: [name]  Dentist: Dr. Dennie Maizes retired, hasn't seen anyone else yet. Ophtho: Dr. Shea Stakes (at [facility])  Cardiologist: Dr. Percival Spanish (no longer requires his care, last seen 2013) Ortho: Dr. Berenice Primas (no longer under his care; problem resolved)  Neuro: Dr. Delice Lesch  End of Life issues: Doesn't have living will or healthcare power of attorney --given forms last year, but not done.  He has a regular power of attorney.  Depression screen: See scanned form. Decreased energy, but is improving.  Sometimes has trouble with concentration, sleep (interrupted by bathroom trips), occasionally feels blue. He doesn't trust his decisions as much, thinks longer, second-guesses himself some (same as last year, "comes with age". Denies depression.  ADL screen: gradually worsening hearing loss. Had 1 fall recently, during an episode where he was "not right"/altered consciousness--what he is seeing neuro for--symptoms occurred while going down the stairs, causing him to trip and fall.  No other falls--see scanned form   Immunization History  Administered Date(s) Administered  . Influenza Split 01/30/2012  . Influenza-Unspecified 01/29/2013  . Pneumococcal Polysaccharide-23 12/17/2004  . Pneumococcal-Unspecified 10/02/2012  . Td 10/29/2004   Last colonoscopy: 11/2012 with Dr. Hilarie Fredrickson (radiation proctitis)  Last PSA: per urologist, every 6 months  Dentist: twice yearly  Ophtho: twice yearly  Exercise: yardwork, active outside. Walks the dogs, but slow/frequent stops. Walks 5-10  minutes up to three times daily, every day.  Past Medical History  Diagnosis Date  . Hypertension   . Hypercholesteremia   . Impaired fasting glucose   . Prostate cancer radiation + seeding implant (Dr.[name])  . Rotator cuff tear, right  10/2010    supraspinatous and infraspinatous  . Diabetes mellitus   . Macular degeneration   . Heart murmur     mild aortic stenosis  . Radiation proctitis 11/2012    treated with APC ablation and Canasa suppositories (Dr. Hilarie Fredrickson)  . TIA (transient ischemic attack) 10/02/13    hosp at [facility], [place] x 1 night    Past Surgical History  Procedure Laterality Date  . Tonsillectomy and adenoidectomy  age 89  . Prostate seed implant    . Rotator cuff repair  06/16/2011    right (Dr. Berenice Primas)  . Colonoscopy N/A 11/28/2012    Procedure: COLONOSCOPY;  Surgeon: Jerene Bears, MD;  Location: [facility];  Service: Gastroenterology;  Laterality: N/A;    History   Social History  . Marital Status: Married    Spouse Name: N/A    Number of Children: 4  . Years of Education: N/A   Occupational History  . Retired    Social History Main Topics  . Smoking status: Former Smoker -- 1.00 packs/day for 20 years    Types: Cigarettes    Quit date: 05/31/1980  . Smokeless tobacco: Never Used  . Alcohol Use: 1.2 oz/week    2 Cans of beer per week     Comment: around 2-3 beers daily as max since hospitalizaton 5/15.  . Drug Use: No  . Sexual Activity: Yes    Partners: Female   Other Topics Concern  . Not on file   Social History Narrative   Retired Engineer, structural.  2 sons, 2 daughters (all in [place]), 15 grandchildren, 1 great grandchild    Family History  Problem Relation Age of Onset  . Heart disease Father 40    Died suddenly.  No diagnosis  . Stroke Sister   . Dementia Mother   . Diabetes Mother   . Cancer Daughter     ovarian (?) vs other male cancer; s/p hyst doing well  . Diabetes Daughter     GDM, and now AODM  . Heart disease Son     congestive heart failure--improved   Outpatient Encounter Prescriptions as of 01/14/2014  Medication Sig Note  . Acetaminophen (TYLENOL ARTHRITIS PAIN PO) Take 2 tablets by mouth 2 (two) times daily.     [name] aspirin 325 MG tablet Take 325 mg by mouth daily.    [name] lisinopril (PRINIVIL,ZESTRIL) 10 MG tablet 20 mg daily 01/14/2014: Taking 10qam and 10qpm  . lovastatin (MEVACOR) 20 MG tablet Take 2 tablets (40 mg total) by mouth at bedtime.   . metoprolol succinate (TOPROL-XL) 50 MG 24 hr tablet TAKE 1 TABLET BY MOUTH 2 TIMES DAILY (TAKE WITH OR IMMEDIATELY FOLLOWING A MEAL)   . Multiple Vitamins-Minerals (EYE VITAMINS PO) Take 1 tablet by mouth daily. Patient states vitamin is actually called "pressure vision," supplement for macular degeneration.   [name] OVER THE COUNTER MEDICATION Sleep aid, takes it most nights   . [DISCONTINUED] lisinopril (PRINIVIL,ZESTRIL) 10 MG tablet TAKE 3 TABLETS ONCE DAILY, INCREASE TO 4/DAY IF BP'S REMAIN >140-150. 12/24/2013: Stopped taking a week ago (on his own)  . [DISCONTINUED] lovastatin (MEVACOR) 20 MG tablet Take 2 tablets (40 mg total) by mouth at bedtime.     No Known  Allergies  ROS: The patient denies anorexia, fever, weight changes, headaches, ear pain, hoarseness, chest pain, palpitations, dyspnea on exertion, cough, swelling, nausea, vomiting, diarrhea, constipation, abdominal pain, melena, hematochezia, indigestion/heartburn, hematuria, weakened urine stream, dysuria, genital lesions, joint pains, numbness, tingling, weakness, tremor, suspicious skin lesions, depression, anxiety, abnormal bleeding/bruising, or enlarged lymph nodes.  Bleeding a little easier since taking aspirin daily +bilateral hearing loss, L>R  Vision loss--worse at night, doesn't drive at night  Urinary urgency, some nocturia, related to how much beer he drinks at night (at least 2x, up to 5x/night). +ED Sometimes he will take a nap in the afternoon.  He states that wife says he snores, but no apnea.   PHYSICAL EXAM: BP 152/90  Pulse 80  Ht 5\' 10"  (1.778 m)  Wt 168 lb (76.204 kg)  BMI 24.11 kg/m2  General Appearance:  Alert, cooperative, no distress, appears stated age   Head:  Normocephalic, without obvious abnormality, atraumatic   Eyes:   PERRL, conjunctiva/corneas clear, EOM's intact, fundi  benign   Ears:  Normal TM's and external ear canals   Nose:  Nares normal, mucosa normal, no drainage or sinus tenderness   Throat:  Lips, mucosa, and tongue normal; gums normal. Tooth decay/black upper right posterior tooth   Neck:  Supple, no lymphadenopathy; thyroid: no enlargement/tenderness/nodules; some radiation of murmur up to carotids. no JVD   Back:  Spine nontender, no curvature, ROM normal, no CVA tenderness   Lungs:  Clear to auscultation bilaterally without wheezes, rales or ronchi; respirations unlabored   Chest Wall:  No tenderness or deformity   Heart:  Regular rate and rhythm, S1 and S2 normal, no rub or gallop. 3/6 SEM heard throughout, loudest at RUSB. Occasional ectopic beat/pause (every 2-10 beats, irregular, but overall regular rhythm)  Breast Exam:  No chest wall tenderness, masses or gynecomastia   Abdomen:  Soft, non-tender, nondistended, normoactive bowel sounds,  no masses, no hepatosplenomegaly   Genitalia:  Deferred to urologist   Rectal:  Deferred   Extremities:  No clubbing, cyanosis or edema   Pulses:  2+ and symmetric all extremities   Skin:  Skin color, texture, turgor normal, no rashes or lesions; Large SK on right face (52mm width x 72mm height)   Lymph nodes:  Cervical, supraclavicular, and axillary nodes normal   Neurologic:  CNII-XII intact, normal strength, sensation and gait; reflexes 2+ and symmetric throughout          Psych: Normal mood, affect, hygiene and grooming.   Diabetic foot exam performed--left 3rd toeness is white, some discoloratin of other nails also   Lab Results  Component Value Date   WBC 7.2 12/24/2013   HGB 13.6 12/24/2013   HCT 39.6 12/24/2013   MCV 94.5 12/24/2013   PLT 397 12/24/2013   Testosterone 226   Chemistry      Component Value Date/Time   NA 141 11/12/2013 1205   K 5.0 11/12/2013 1205   CL 103 11/12/2013 1205   CO2 27 11/12/2013 1205   BUN 19 11/12/2013 1205    CREATININE 0.77 11/12/2013 1205   CREATININE 1.0 08/16/2008 1039      Component Value Date/Time   CALCIUM 9.7 11/12/2013 1205   ALKPHOS 35* 11/12/2013 1205   AST 20 11/12/2013 1205   ALT 17 11/12/2013 1205   BILITOT 0.7 11/12/2013 1205     Fasting glucose 118  Lab Results  Component Value Date   HGBA1C 5.8 11/12/2013   Lab Results  Component Value Date  CHOL 170 11/12/2013   HDL 60 11/12/2013   LDLCALC 78 11/12/2013   TRIG 158* 11/12/2013   CHOLHDL 2.8 11/12/2013   Microalbumin/Cr ratio was elevated to 40.9 in 07/2013   ASSESSMENT/PLAN:  Medicare annual wellness visit, subsequent  Essential hypertension, benign - overall controlled (wide range of fluctuations).  continue current meds - Plan: Urinalysis Dipstick  Pure hypercholesterolemia - Plan: lovastatin (MEVACOR) 20 MG tablet  Type II or unspecified type diabetes mellitus without mention of complication, not stated as uncontrolled - not on meds; reviewed diet at length, rec for weight loss, cut beerbackto 1/day - Plan: HM Diabetes Foot Exam, Urinalysis Dipstick  Awareness alteration, transient - f/u as planned for prolonged EEG.  discussed potential risks with driving.  discussed potential triggers--avoid heat, hydrate well  Low serum testosterone level - not likely candidate for replacement given h/o prostate CA.  Minimally symptomatic (energy improving). f/u with [name] as planned  Microalbuminuria - recheck today.  BP's improved,so hopefully will be better. - Plan: Microalbumin / creatinine urine ratio, Urinalysis Dipstick   Reviewed fluid intake (he doesn't like water or soda)  Send testosterone/labs to [name] at [facility]--f/u with [name] as scheduled.  Likely no treatment recommended or needed Energy has improved some  Pneumonia vaccine 09/2012--?type?where given. He will check into Might need Prevnar at some point (unsure of which type he received, suspect pneumovax)  Risks/benefits of zostavax  reviewed in detail.  Rx written TdaP is recommended, not covered by Medicare.  Patient will check on coverage by secondary insurance  Microalbuminuria in March--repeat today, along with u/a  Recommended at least 30 minutes of aerobic activity at least 5 days/week; proper sunscreen use reviewed; healthy diet and alcohol recommendations (1 drink/day or less preferred, occasionally 2) reviewed; regular seatbelt use; changing batteries in smoke detectors. Self-testicular exams. Immunization recommendations discussed as above.  Colonoscopy recommendations reviewed--UTD.  Reviewed MOST form.

## 2014-01-14 NOTE — Patient Instructions (Signed)
  HEALTH MAINTENANCE RECOMMENDATIONS:  It is recommended that you get at least 30 minutes of aerobic exercise at least 5 days/week (for weight loss, you may need as much as 60-90 minutes). This can be any activity that gets your heart rate up. This can be divided in 10-15 minute intervals if needed, but try and build up your endurance at least once a week.  Weight bearing exercise is also recommended twice weekly.  Eat a healthy diet with lots of vegetables, fruits and fiber.  "Colorful" foods have a lot of vitamins (ie green vegetables, tomatoes, red peppers, etc).  Limit sweet tea, regular sodas and alcoholic beverages, all of which has a lot of calories and sugar.  Up to 2 alcoholic drinks daily may be beneficial for men (unless trying to lose weight, watch sugars).  Drink a lot of water.  Sunscreen of at least SPF 30 should be used on all sun-exposed parts of the skin when outside between the hours of 10 am and 4 pm (not just when at beach or pool, but even with exercise, golf, tennis, and yard work!)  Use a sunscreen that says "broad spectrum" so it covers both UVA and UVB rays, and make sure to reapply every 1-2 hours.  Remember to change the batteries in your smoke detectors when changing your clock times in the spring and fall.  Use your seat belt every time you are in a car, and please drive safely and not be distracted with cell phones and texting while driving.   See if you can look into the pneumonia vaccine that is documented in the computer as given 10/02/2012  Shingles vaccine is recommended.  Take the prescription to the pharmacy to get the vaccine.  You should wait a month between your flu shot (or any vaccine) and the shingles vaccine.  Tdap (tetanus and pertussis vaccine) is recommended, but not covered by Medicare.  Check on coverage by secondary insurance.  We can do the same time as flu shot, or at your next visit, or at a nurse visit.

## 2014-01-15 ENCOUNTER — Encounter: Payer: Self-pay | Admitting: Family Medicine

## 2014-01-15 DIAGNOSIS — E1151 Type 2 diabetes mellitus with diabetic peripheral angiopathy without gangrene: Secondary | ICD-10-CM | POA: Insufficient documentation

## 2014-01-15 DIAGNOSIS — E1129 Type 2 diabetes mellitus with other diabetic kidney complication: Secondary | ICD-10-CM

## 2014-01-15 HISTORY — DX: Proteinuria, unspecified: E11.29

## 2014-01-15 LAB — MICROALBUMIN / CREATININE URINE RATIO
Creatinine, Urine: 105.7 mg/dL
MICROALB UR: 9.1 mg/dL — AB (ref 0.00–1.89)
MICROALB/CREAT RATIO: 86.1 mg/g — AB (ref 0.0–30.0)

## 2014-01-24 DIAGNOSIS — H3589 Other specified retinal disorders: Secondary | ICD-10-CM | POA: Insufficient documentation

## 2014-01-24 DIAGNOSIS — H2510 Age-related nuclear cataract, unspecified eye: Secondary | ICD-10-CM | POA: Diagnosis not present

## 2014-01-24 DIAGNOSIS — H353190 Nonexudative age-related macular degeneration, unspecified eye, stage unspecified: Secondary | ICD-10-CM | POA: Diagnosis not present

## 2014-01-28 DIAGNOSIS — R351 Nocturia: Secondary | ICD-10-CM | POA: Diagnosis not present

## 2014-01-28 DIAGNOSIS — C61 Malignant neoplasm of prostate: Secondary | ICD-10-CM | POA: Diagnosis not present

## 2014-01-28 DIAGNOSIS — E291 Testicular hypofunction: Secondary | ICD-10-CM | POA: Diagnosis not present

## 2014-01-30 ENCOUNTER — Ambulatory Visit (INDEPENDENT_AMBULATORY_CARE_PROVIDER_SITE_OTHER): Payer: Medicare Other | Admitting: Neurology

## 2014-01-30 DIAGNOSIS — R404 Transient alteration of awareness: Secondary | ICD-10-CM | POA: Diagnosis not present

## 2014-02-03 ENCOUNTER — Encounter: Payer: Self-pay | Admitting: Family Medicine

## 2014-02-03 DIAGNOSIS — Z23 Encounter for immunization: Secondary | ICD-10-CM | POA: Diagnosis not present

## 2014-02-05 ENCOUNTER — Encounter: Payer: Self-pay | Admitting: Internal Medicine

## 2014-02-06 ENCOUNTER — Encounter: Payer: Self-pay | Admitting: Neurology

## 2014-02-06 ENCOUNTER — Encounter: Payer: Self-pay | Admitting: *Deleted

## 2014-02-07 ENCOUNTER — Telehealth: Payer: Self-pay | Admitting: Neurology

## 2014-02-07 NOTE — Telephone Encounter (Signed)
Pt's spouse called f/u on the results for his EEG. Please call pt #[phone]

## 2014-02-07 NOTE — Telephone Encounter (Signed)
Discussed 24-hr EEG results with patient. EEG did not show any evidence of seizure discharges. The same slowing was seen over the bilateral temporal regions, however I discussed with him that this is non-specific. MRI brain reviewed, this is likely related to moderate chronic microvascular disease.  We again discussed that the etiology of his symptoms is unclear, at this point, no clear evidence for seizures or to start seizure medication. It may relate to either hypertension or glucose, which he will continue to monitor. He reports BP had been up and down, but is now more stabilized.  We discussed that if he has another episode, the next best step would be video EEG monitoring at [facility] to capture and classify these episodes.  He expressed understanding and will call our office in the event this occurs.   Dr. Tomi Bamberger, fyi regarding our discussion, patient wanted you in the loop.

## 2014-02-07 NOTE — Procedures (Signed)
ELECTROENCEPHALOGRAM REPORT  Dates of Recording: 01/30/2014 to 01/31/2014  Patient's Name: Jeffrey [name] MRN: [id number] Date of Birth: June 08, 1932  Referring Provider: Dr. Ellouise Newer  Procedure: 24-hour ambulatory EEG  History: This is a 80 year old man with 3 episodes of blank stare and unresponsiveness that he is amnestic of, with no warning symptoms.  Medications: Aspirin, lovastatin, Lipitor, Metoprolol  Technical Summary: This is a 24-hour multichannel digital EEG recording measured by the international 10-20 system with electrodes applied with paste and impedances below 5000 ohms performed as portable with EKG monitoring.  The digital EEG was referentially recorded, reformatted, and digitally filtered in a variety of bipolar and referential montages for optimal display.    DESCRIPTION OF RECORDING: During maximal wakefulness, the background activity consisted of a symmetric 10 Hz posterior dominant rhythm which was reactive to eye opening. There is occasional focal 2-3 Hz delta slowing seen with shifting asymmetry over the bilateral temporal regions.  There were no epileptiform discharges or focal slowing seen in wakefulness.  During the recording, the patient progresses through wakefulness, drowsiness, and Stage 2 sleep. Similar occasional focal slowing was seen with shifting asymmetry over the bilateral temporal regions.  Again, there were no epileptiform discharges seen.  Events: There were no push button events. Typical events were not reported.  There were no electrographic seizures seen.  EKG lead showed irregular rhythm.  IMPRESSION: This 24-hour ambulatory EEG study is abnormal due to occasional focal slowing seen over the bilateral temporal regions.  CLINICAL CORRELATION of the above findings indicates focal cerebral dysfunction over the bilateral temporal regions suggestive of underlying structural or physiologic abnormality.  There were no epileptiform discharges or  electrographic seizures seen.  The absence of epileptiform discharges does not exclude a clinical diagnosis of epilepsy.  If further clinical questions remain, inpatient video EEG monitoring may be helpful.  Note of irregular rhythm on EKG lead.   Ellouise Newer, M.D.

## 2014-02-18 ENCOUNTER — Encounter: Payer: Self-pay | Admitting: Family Medicine

## 2014-03-08 ENCOUNTER — Encounter: Payer: Self-pay | Admitting: Family Medicine

## 2014-03-25 ENCOUNTER — Encounter: Payer: Self-pay | Admitting: Family Medicine

## 2014-03-27 ENCOUNTER — Ambulatory Visit (INDEPENDENT_AMBULATORY_CARE_PROVIDER_SITE_OTHER): Payer: Medicare Other | Admitting: Family Medicine

## 2014-03-27 ENCOUNTER — Encounter: Payer: Self-pay | Admitting: Family Medicine

## 2014-03-27 VITALS — BP 230/118 | HR 80 | Ht 70.0 in | Wt 169.0 lb

## 2014-03-27 DIAGNOSIS — R404 Transient alteration of awareness: Secondary | ICD-10-CM | POA: Diagnosis not present

## 2014-03-27 DIAGNOSIS — I10 Essential (primary) hypertension: Secondary | ICD-10-CM | POA: Diagnosis not present

## 2014-03-27 MED ORDER — LISINOPRIL 20 MG PO TABS: 20.0000 mg | ORAL_TABLET | Freq: Two times a day (BID) | ORAL | Status: DC

## 2014-03-27 NOTE — Patient Instructions (Addendum)
Increase the lisinopril to 20mg  twice daily. Continue current dose of metoprolol (50mg ). Continue to monitor blood pressure--cut back to just twice daily (morning/evening), more often only if you are feeling bad (headache, dizziness)  Go to the emergency room if you develop severe headache, chest pain, shortness of breath, difficulty in thinking/speaking with the high blood pressure (as we discussed)   Low-Sodium Eating Plan Sodium raises blood pressure and causes water to be held in the body. Getting less sodium from food will help lower your blood pressure, reduce any swelling, and protect your heart, liver, and kidneys. We get sodium by adding salt (sodium chloride) to food. Most of our sodium comes from canned, boxed, and frozen foods. Restaurant foods, fast foods, and pizza are also very high in sodium. Even if you take medicine to lower your blood pressure or to reduce fluid in your body, getting less sodium from your food is important. WHAT IS MY PLAN? Most people should limit their sodium intake to 2,300 mg a day. Your health care provider recommends that you limit your sodium intake to __________ a day.  WHAT DO I NEED TO KNOW ABOUT THIS EATING PLAN? For the low-sodium eating plan, you will follow these general guidelines:  Choose foods with a % Daily Value for sodium of less than 5% (as listed on the food label).   Use salt-free seasonings or herbs instead of table salt or sea salt.   Check with your health care provider or pharmacist before using salt substitutes.   Eat fresh foods.  Eat more vegetables and fruits.  Limit canned vegetables. If you do use them, rinse them well to decrease the sodium.   Limit cheese to 1 oz (28 g) per day.   Eat lower-sodium products, often labeled as "lower sodium" or "no salt added."  Avoid foods that contain monosodium glutamate (MSG). MSG is sometimes added to [place] food and some canned foods.  Check food labels (Nutrition  Facts labels) on foods to learn how much sodium is in one serving.  Eat more home-cooked food and less restaurant, buffet, and fast food.  When eating at a restaurant, ask that your food be prepared with less salt or none, if possible.  HOW DO I READ FOOD LABELS FOR SODIUM INFORMATION? The Nutrition Facts label lists the amount of sodium in one serving of the food. If you eat more than one serving, you must multiply the listed amount of sodium by the number of servings. Food labels may also identify foods as:  Sodium free--Less than 5 mg in a serving.  Very low sodium--35 mg or less in a serving.  Low sodium--140 mg or less in a serving.  Light in sodium--50% less sodium in a serving. For example, if a food that usually has 300 mg of sodium is changed to become light in sodium, it will have 150 mg of sodium.  Reduced sodium--25% less sodium in a serving. For example, if a food that usually has 400 mg of sodium is changed to reduced sodium, it will have 300 mg of sodium. WHAT FOODS CAN I EAT? Grains Low-sodium cereals, including oats, puffed wheat and rice, and shredded wheat cereals. Low-sodium crackers. Unsalted rice and pasta. Lower-sodium bread.  Vegetables Frozen or fresh vegetables. Low-sodium or reduced-sodium canned vegetables. Low-sodium or reduced-sodium tomato sauce and paste. Low-sodium or reduced-sodium tomato and vegetable juices.  Fruits Fresh, frozen, and canned fruit. Fruit juice.  Meat and Other Protein Products Low-sodium canned tuna and salmon. Fresh or  frozen meat, poultry, seafood, and fish. Lamb. Unsalted nuts. Dried beans, peas, and lentils without added salt. Unsalted canned beans. Homemade soups without salt. Eggs.  Dairy Milk. Soy milk. Ricotta cheese. Low-sodium or reduced-sodium cheeses. Yogurt.  Condiments Fresh and dried herbs and spices. Salt-free seasonings. Onion and garlic powders. Low-sodium varieties of mustard and ketchup. Lemon juice.  Fats  and Oils Reduced-sodium salad dressings. Unsalted butter.  Other Unsalted popcorn and pretzels.  The items listed above may not be a complete list of recommended foods or beverages. Contact your dietitian for more options. WHAT FOODS ARE NOT RECOMMENDED? Grains Instant hot cereals. Bread stuffing, pancake, and biscuit mixes. Croutons. Seasoned rice or pasta mixes. Noodle soup cups. Boxed or frozen macaroni and cheese. Self-rising flour. Regular salted crackers. Vegetables Regular canned vegetables. Regular canned tomato sauce and paste. Regular tomato and vegetable juices. Frozen vegetables in sauces. Salted french fries. Olives. Angie Fava. Relishes. Sauerkraut. Salsa. Meat and Other Protein Products Salted, canned, smoked, spiced, or pickled meats, seafood, or fish. Bacon, ham, sausage, hot dogs, corned beef, chipped beef, and packaged luncheon meats. Salt pork. Jerky. Pickled herring. Anchovies, regular canned tuna, and sardines. Salted nuts. Dairy Processed cheese and cheese spreads. Cheese curds. Blue cheese and cottage cheese. Buttermilk.  Condiments Onion and garlic salt, seasoned salt, table salt, and sea salt. Canned and packaged gravies. Worcestershire sauce. Tartar sauce. Barbecue sauce. Teriyaki sauce. Soy sauce, including reduced sodium. Steak sauce. Fish sauce. Oyster sauce. Cocktail sauce. Horseradish. Regular ketchup and mustard. Meat flavorings and tenderizers. Bouillon cubes. Hot sauce. Tabasco sauce. Marinades. Taco seasonings. Relishes. Fats and Oils Regular salad dressings. Salted butter. Margarine. Ghee. Bacon fat.  Other Potato and tortilla chips. Corn chips and puffs. Salted popcorn and pretzels. Canned or dried soups. Pizza. Frozen entrees and pot pies.  The items listed above may not be a complete list of foods and beverages to avoid. Contact your dietitian for more information. Document Released: 11/06/2001 Document Revised: 05/22/2013 Document Reviewed:  03/21/2013 [facility] Patient Information 2015 [place], [place]. This information is not intended to replace advice given to you by your health care provider. Make sure you discuss any questions you have with your health care provider.

## 2014-03-27 NOTE — Progress Notes (Signed)
Chief Complaint  Patient presents with  . Medication Dose Change    bp numbers has been elevated-made appt for possible med change.    He felt like his BP's had gotten very good, so backed off on checking it as regularly.  He started to feel like it was running high again--tired, sometimes a little swimmy-headed, and generally just not feeling as good.  He noticed this about 2 weeks ago.  He denies any changes in activity, diet.  He has been trying to follow a low sodium diet (at least since noticing the BP being up again).  He feels like his medication has become less effective in the past few weeks, ?"bad batch"? BP is always high when he first wakes up, improves after strenuous activity such as raking, but then is back to being high about 30 minutes later.  He brings in list of BP's.  Morning is running around 200/88-105, noon 143-190/68-81, after lunch (2-3pm) --3 values listed, two were 140's/60's-70's, but yesterday was 226/94.  BP at home this morning was 197/88.  Pulse ranges 65-90.  He hasn't had headache, sometimes just a "dull discomfort".  Currently feels fine--no headache or swimmyheaded sensation.  He started taking an extra 10mg  tablet of lisinopril at bedtime just the last few nights.  He is walking 2-3x/day, for about 10 minutes. If he walks very fast, he sometimes feels a tightness in his chest., resolved by rest for just a short bit, or even if just walking slower. He last saw Dr. Percival Spanish in 07/2011.  He had cardiac eval while in hospital at [place] (echo 09/2013--showed mild AS and TR).  Doesn't check blood sugars.  Denies polydipsia, some increase in urinary frequency recently.  Past Medical History  Diagnosis Date  . Hypertension   . Hypercholesteremia   . Impaired fasting glucose   . Prostate cancer radiation + seeding implant (Dr.[name])  . Rotator cuff tear, right 10/2010    supraspinatous and infraspinatous  . Diabetes mellitus   . Macular degeneration   . Heart  murmur     mild aortic stenosis  . Radiation proctitis 11/2012    treated with APC ablation and Canasa suppositories (Dr. Hilarie Fredrickson)  . TIA (transient ischemic attack) 10/02/13    hosp at [facility] x 1 night  . Type 2 diabetes mellitus with microalbuminuria or microproteinuria 01/15/2014   Past Surgical History  Procedure Laterality Date  . Tonsillectomy and adenoidectomy  age 65  . Prostate seed implant    . Rotator cuff repair  06/16/2011    right (Dr. Berenice Primas)  . Colonoscopy N/A 11/28/2012    Procedure: COLONOSCOPY;  Surgeon: Jerene Bears, MD;  Location: [facility];  Service: Gastroenterology;  Laterality: N/A;   History   Social History  . Marital Status: Married    Spouse Name: N/A    Number of Children: 4  . Years of Education: N/A   Occupational History  . Retired    Social History Main Topics  . Smoking status: Former Smoker -- 1.00 packs/day for 20 years    Types: Cigarettes    Quit date: 05/31/1980  . Smokeless tobacco: Never Used  . Alcohol Use: 1.2 oz/week    2 Cans of beer per week     Comment: around 2 beers daily (occasionally a third) as max since hospitalizaton 5/15.  . Drug Use: No  . Sexual Activity: Yes    Partners: Female   Other Topics Concern  . Not on file   Social  History Narrative   Retired Engineer, structural.  2 sons, 2 daughters (all in [place]), 15 grandchildren, 1 great grandchild   Outpatient Encounter Prescriptions as of 03/27/2014  Medication Sig Note  . Acetaminophen (TYLENOL ARTHRITIS PAIN PO) Take 2 tablets by mouth 2 (two) times daily.     [name] aspirin 325 MG tablet Take 325 mg by mouth daily.   [name] lovastatin (MEVACOR) 20 MG tablet Take 2 tablets (40 mg total) by mouth at bedtime.   . metoprolol succinate (TOPROL-XL) 50 MG 24 hr tablet TAKE 1 TABLET BY MOUTH 2 TIMES DAILY (TAKE WITH OR IMMEDIATELY FOLLOWING A MEAL)   . Multiple Vitamins-Minerals (EYE VITAMINS PO) Take 1 tablet by mouth daily. Patient states vitamin is actually called "pressure vision,"  supplement for macular degeneration.   [name] OVER THE COUNTER MEDICATION Sleep aid, takes it most nights   . [DISCONTINUED] lisinopril (PRINIVIL,ZESTRIL) 10 MG tablet 20 mg daily 03/27/2014: Takes 1 pill BID.  Taking an extra 10mg  tablet at bedtime for the last few days  . lisinopril (PRINIVIL,ZESTRIL) 20 MG tablet Take 1 tablet (20 mg total) by mouth 2 (two) times daily.    No Known Allergies  ROS:  No headache, numbness, tingling, difficulty with memory, speech or other neuro complaints.  Mild chest discomfort only when walking very quickly, as per HPI.  Denies URI symptoms, fever, chills, nausea, vomiting, GI complaints, shortness of breath, cough.  No edema.  See HPI.  PHYSICAL EXAM: BP 230/118  Pulse 80  Ht 5\' 10"  (1.778 m)  Wt 169 lb (76.658 kg)  BMI 24.25 kg/m2 218/110 on repeat by MD, RA Well developed, pleasant male, accompanied by his wife, in no distress. He is in good spirits HEENT:  PERRL, EOMI, conjunctiva clear Neck: no lymphadenopathy, thyromegaly or carotid bruit Heart: regular rate and rhythm with 3/6 SEM, unchanged Lungs: clear bilaterally Back: no CVA tenderness Abdomen: soft, nontender, no mass Extremities: no edema Neuro: alert and oriented.  Cranial nerves intact. Normal strength, gait  ASSESSMENT/PLAN:  Essential hypertension, benign - labile, with recent elevations in BP without cause.  Increase lisinopril to 20mg  BID; cont low sodium diet, exercise - Plan: lisinopril (PRINIVIL,ZESTRIL) 20 MG tablet  Awareness alteration, transient - no further recurrence.  Reviewed neuro's recommendation--if recurrent symptoms, plan is video EEG   Increase the lisinopril to 20mg  twice daily. Continue current dose of metoprolol (50mg ). Continue to monitor blood pressure--cut back to just twice daily (morning/evening), more often only if you are feeling bad (headache, dizziness)  Given some mild exertional chest pain, consider further cardiac eval (if doesn't improve as BP's  improve). He has always had a white coat component to his BP's and frequently are high in the office (and asymptomatic).  Clearly, numbers at home are running high, and are not controlled.  Discussed increasing lisinopril vs increasing beta blocker.  Given his low dose of lisinopril, will increase that first (since he has already started titrating up); he clearly has room (in BP/pulse) to titrate up the beta blocker as well, which might help with any angina.  F/u 2 weeks with list of BP's.  F/u sooner if BPs remain this high, for adjustment of beta blocker dose.    He had questions re: recommendations from neuro, and her recommendations were reviewed with patient, all questions answered.  25 min visit, more than 1/2 spent counseling.

## 2014-04-10 ENCOUNTER — Ambulatory Visit (INDEPENDENT_AMBULATORY_CARE_PROVIDER_SITE_OTHER): Payer: Medicare Other | Admitting: Family Medicine

## 2014-04-10 ENCOUNTER — Encounter: Payer: Self-pay | Admitting: Family Medicine

## 2014-04-10 VITALS — BP 172/108 | HR 72 | Ht 70.0 in | Wt 168.0 lb

## 2014-04-10 DIAGNOSIS — E119 Type 2 diabetes mellitus without complications: Secondary | ICD-10-CM | POA: Diagnosis not present

## 2014-04-10 DIAGNOSIS — Z5181 Encounter for therapeutic drug level monitoring: Secondary | ICD-10-CM

## 2014-04-10 DIAGNOSIS — I10 Essential (primary) hypertension: Secondary | ICD-10-CM | POA: Diagnosis not present

## 2014-04-10 DIAGNOSIS — M79672 Pain in left foot: Secondary | ICD-10-CM | POA: Diagnosis not present

## 2014-04-10 DIAGNOSIS — E7800 Pure hypercholesterolemia, unspecified: Secondary | ICD-10-CM

## 2014-04-10 DIAGNOSIS — M79671 Pain in right foot: Secondary | ICD-10-CM

## 2014-04-10 LAB — LIPID PANEL
Cholesterol: 166 mg/dL (ref 0–200)
HDL: 53 mg/dL (ref >39)
LDL Cholesterol: 79 mg/dL (ref 0–99)
TRIGLYCERIDES: 169 mg/dL — AB (ref <150)
Total CHOL/HDL Ratio: 3.1 Ratio
VLDL: 34 mg/dL (ref 0–40)

## 2014-04-10 LAB — COMPREHENSIVE METABOLIC PANEL
ALBUMIN: 4.4 g/dL (ref 3.5–5.2)
ALT: 20 U/L (ref 0–53)
AST: 22 U/L (ref 0–37)
Alkaline Phosphatase: 35 U/L — ABNORMAL LOW (ref 39–117)
BUN: 16 mg/dL (ref 6–23)
CALCIUM: 9.6 mg/dL (ref 8.4–10.5)
CO2: 26 meq/L (ref 19–32)
Chloride: 101 mEq/L (ref 96–112)
Creat: 0.82 mg/dL (ref 0.50–1.35)
GLUCOSE: 122 mg/dL — AB (ref 70–99)
POTASSIUM: 5 meq/L (ref 3.5–5.3)
SODIUM: 139 meq/L (ref 135–145)
TOTAL PROTEIN: 7.2 g/dL (ref 6.0–8.3)
Total Bilirubin: 0.5 mg/dL (ref 0.2–1.2)

## 2014-04-10 NOTE — Progress Notes (Signed)
Chief Complaint  Patient presents with  . Hypertension    2 week bp follow up.    Patient presents to follow up on hypertension.  He doubled up on the lisinopril 54m he had left at home, didn't notice any improvement in blood pressure.  He then filled the 236mprescription, and immediately noticed improvement.  He states pharmacy hadn't changed source of generic.  BP's have been running a little higher in the mornings, but low after exercise (mainly yardwork).BP's fluctuating between 128-179 (mostly over 140's in the morning, lower in afternoons)/65-87.  After yardwork, BP's were 108/56, 119/61.  He denies headaches, shortness of breath. He can tell when blood pressure is very high or very low.  When around 10762ystolic, he feels dizzy only when standing quickly. He hasn't had any further exertional chest pain (only had that once or twice, prior to last visit).  He hasn't been walking much due to his foot pain and weather.  He did not have any exertional discomfort with yardwork.  A few days after his last visit, his feet were tender, mainly at his heels.  He needed to use a cane for a day or so. It has improved, but hasn't completely resolved.  There was no visible swelling, redness, bruising. Denies change in shoes or change/increase in activity. Pain started acutely in his left heel when walking down the driveway.  It is fine in the morning, but gets worse as the day goes on.  He does have increased discomfort after sitting a while.  He has newer walking shoes, but hasn't been wearing them, continues to wear older shoes.    [place] He is fasting today--due for routine labs for diabetes and cholesterol.  He denies any side effects or problems.  Outpatient Encounter Prescriptions as of 04/10/2014  Medication Sig  . Acetaminophen (TYLENOL ARTHRITIS PAIN PO) Take 2 tablets by mouth 2 (two) times daily.    . Marland Kitchenspirin 325 MG tablet Take 325 mg by mouth daily.  . Marland Kitchenisinopril  (PRINIVIL,ZESTRIL) 20 MG tablet Take 1 tablet (20 mg total) by mouth 2 (two) times daily.  . Marland Kitchenovastatin (MEVACOR) 20 MG tablet Take 2 tablets (40 mg total) by mouth at bedtime.  . metoprolol succinate (TOPROL-XL) 50 MG 24 hr tablet TAKE 1 TABLET BY MOUTH 2 TIMES DAILY (TAKE WITH OR IMMEDIATELY FOLLOWING A MEAL)  . Multiple Vitamins-Minerals (EYE VITAMINS PO) Take 1 tablet by mouth daily. Patient states vitamin is actually called "pressure vision," supplement for macular degeneration.  . Marland KitchenVER THE COUNTER MEDICATION Sleep aid, takes it most nights   No Known Allergies  ROS:  No fever, chills, headaches, nausea, vomiting, bowel changes, chest pain (see HPI), shortness of breath, URI symptoms, cough, bleeding, bruising, edema or other complaints.  PHYSICAL EXAM: BP 172/108 mmHg  Pulse 72  Ht '5\' 10"'  (1.778 m)  Wt 168 lb (76.204 kg)  BMI 24.11 kg/m2 Repeat BP 174/96 by MD Well developed, pleasant male in no distress.  Only appears slightly anxious.  Wife not present today Heart: regular rate and rhythm with unchanged murmur Lungs: clear bilaterally Extremities: no edema. Mildly tender at left anteromedial calcaneous  ASSESSMENT/PLAN:  Essential hypertension, benign - improved control, still with significant fluctuations.  Continue 2033misinopril; exercise daily - Plan: Comprehensive metabolic panel  Medication monitoring encounter - Plan: Comprehensive metabolic panel, Lipid panel  Diabetes type 2, controlled - Plan: Hemoglobin A1c  Pure hypercholesterolemia - Plan: Lipid panel  Heel pain, bilateral - L>R.  suspect component of plantar fasciitis.  Discussed orthotic/arch support.  f/u with ortho if ongoing pain.  may use NSAID short-term prn   Consider stress test (states he had one scheduled with Dr. Percival Spanish, but it was canceled when his BP was too high) if recurrent exertional chest pain (when he is able to start getting more exercise).  L>R heel pain, somewhat atypical for  suggestive of plantar fasciitis.  Continue your current blood pressure medication, and monitoring.  Try and exercise daily to keep those numbers down.  If you have any recurrent chest pressure/pain, please let me know--I will then refer you back for a stress test.  I think some of your heel pain is likely due to plantar fasciitis.  Getting an orthotic for your shoes (vs wearing your more supportive walking shoes that you already have) will help.  Do stretches as shown.  Anti-inflammatories for 7-10 days might also help faster--I recommend Aleve twice daily with food for 7-10 days.   A1c, lipid, c-met F/u as scheduled in February, sooner prn

## 2014-04-10 NOTE — Patient Instructions (Signed)
Continue your current blood pressure medication, and monitoring.  Try and exercise daily to keep those numbers down.  If you have any recurrent chest pressure/pain, please let me know--I will then refer you back for a stress test.  I think some of your heel pain is likely due to plantar fasciitis.  Getting an orthotic for your shoes (vs wearing your more supportive walking shoes that you already have) will help.  Do stretches as shown.  Anti-inflammatories for 7-10 days might also help faster--I recommend Aleve twice daily with food for 7-10 days.  If you aren't getting better, we might need you to see a podiatrist.  Plantar Fasciitis Plantar fasciitis is a common condition that causes foot pain. It is soreness (inflammation) of the band of tough fibrous tissue on the bottom of the foot that runs from the heel bone (calcaneus) to the ball of the foot. The cause of this soreness may be from excessive standing, poor fitting shoes, running on hard surfaces, being overweight, having an abnormal walk, or overuse (this is common in runners) of the painful foot or feet. It is also common in aerobic exercise dancers and ballet dancers. SYMPTOMS  Most people with plantar fasciitis complain of:  Severe pain in the morning on the bottom of their foot especially when taking the first steps out of bed. This pain recedes after a few minutes of walking.  Severe pain is experienced also during walking following a long period of inactivity.  Pain is worse when walking barefoot or up stairs DIAGNOSIS   Your caregiver will diagnose this condition by examining and feeling your foot.  Special tests such as X-rays of your foot, are usually not needed. PREVENTION   Consult a sports medicine professional before beginning a new exercise program.  Walking programs offer a good workout. With walking there is a lower chance of overuse injuries common to runners. There is less impact and less jarring of the  joints.  Begin all new exercise programs slowly. If problems or pain develop, decrease the amount of time or distance until you are at a comfortable level.  Wear good shoes and replace them regularly.  Stretch your foot and the heel cords at the back of the ankle (Achilles tendon) both before and after exercise.  Run or exercise on even surfaces that are not hard. For example, asphalt is better than pavement.  Do not run barefoot on hard surfaces.  If using a treadmill, vary the incline.  Do not continue to workout if you have foot or joint problems. Seek professional help if they do not improve. HOME CARE INSTRUCTIONS   Avoid activities that cause you pain until you recover.  Use ice or cold packs on the problem or painful areas after working out.  Only take over-the-counter or prescription medicines for pain, discomfort, or fever as directed by your caregiver.  Soft shoe inserts or athletic shoes with air or gel sole cushions may be helpful.  If problems continue or become more severe, consult a sports medicine caregiver or your own health care provider. Cortisone is a potent anti-inflammatory medication that may be injected into the painful area. You can discuss this treatment with your caregiver. MAKE SURE YOU:   Understand these instructions.  Will watch your condition.  Will get help right away if you are not doing well or get worse. Document Released: 02/09/2001 Document Revised: 08/09/2011 Document Reviewed: 04/10/2008 [facility] Patient Information 2015 [place], [place]. This information is not intended to replace advice  to you by your health care provider. Make sure you discuss any questions you have with your health care provider.  

## 2014-04-11 LAB — HEMOGLOBIN A1C
Hgb A1c MFr Bld: 5.9 % — ABNORMAL HIGH (ref <5.7)
Mean Plasma Glucose: 123 mg/dL — ABNORMAL HIGH (ref <117)

## 2014-04-15 ENCOUNTER — Other Ambulatory Visit: Payer: Self-pay | Admitting: Family Medicine

## 2014-06-16 ENCOUNTER — Other Ambulatory Visit: Payer: Self-pay | Admitting: Family Medicine

## 2014-07-08 ENCOUNTER — Other Ambulatory Visit: Payer: Self-pay | Admitting: Family Medicine

## 2014-07-08 DIAGNOSIS — C61 Malignant neoplasm of prostate: Secondary | ICD-10-CM | POA: Diagnosis not present

## 2014-07-17 ENCOUNTER — Encounter: Payer: Self-pay | Admitting: Family Medicine

## 2014-07-17 ENCOUNTER — Ambulatory Visit (INDEPENDENT_AMBULATORY_CARE_PROVIDER_SITE_OTHER): Payer: Medicare Other | Admitting: Family Medicine

## 2014-07-17 VITALS — BP 152/100 | HR 60 | Ht 70.0 in | Wt 170.0 lb

## 2014-07-17 DIAGNOSIS — I10 Essential (primary) hypertension: Secondary | ICD-10-CM

## 2014-07-17 DIAGNOSIS — E7800 Pure hypercholesterolemia, unspecified: Secondary | ICD-10-CM

## 2014-07-17 DIAGNOSIS — Z91048 Other nonmedicinal substance allergy status: Secondary | ICD-10-CM

## 2014-07-17 DIAGNOSIS — Z9109 Other allergy status, other than to drugs and biological substances: Secondary | ICD-10-CM

## 2014-07-17 DIAGNOSIS — E119 Type 2 diabetes mellitus without complications: Secondary | ICD-10-CM | POA: Diagnosis not present

## 2014-07-17 LAB — POCT GLYCOSYLATED HEMOGLOBIN (HGB A1C): HEMOGLOBIN A1C: 5.8

## 2014-07-17 MED ORDER — LOVASTATIN 20 MG PO TABS: 40.0000 mg | ORAL_TABLET | Freq: Every day | ORAL | Status: DC

## 2014-07-17 MED ORDER — AMLODIPINE BESYLATE 2.5 MG PO TABS: 2.5000 mg | ORAL_TABLET | Freq: Every day | ORAL | Status: DC

## 2014-07-17 NOTE — Patient Instructions (Addendum)
  Add amlodipine 2.5mg  once daily.  Continue monitoring BP at home.  If BP's drop to <643 systolic after exercise, accompanied by dizziness, then cut back on the lisinopril.  Current dose is 20mg  twice daily, cut back to 20mg  once daly (or 10 twice daily) and continue monitoring.  Fax/mail list of blood pressure/pulse within the month.  If BP consistently remains >160/90 (as they currently are), then the plan is to increase the amlodipine to 5 mg daily (and continue 20mg  twice daily of lisinopril, and in both cases, continue the metoprolol as it is now (can be both taken once daily, or continue as you are taking now).   Allergy--trial of antihistamine.  Prevention if offending item can be determined.

## 2014-07-17 NOTE — Progress Notes (Signed)
Chief Complaint  Patient presents with  . Hypertension    6 month fasting med check.    Patient presents for follow up on hypertension.  His lisinopril dose had been increased to 20mg  prior to his last visit, and BP's had improved, with ongoing considerable fluctuations.  His walking was limited some by his foot/heel pain.  That has improved, and he just restarted walking (going to the mall).  Weather has limited his yardwork.  His recent BP's range from 152-191/75-91. The lower ones were after exercise, but hasn't been getting much exercise (previously used to see much lower blood pressures after exercise). Denies any exertional chest pain or dizziness (since BP hasn't been low).  Denies headaches.  He doesn't have much energy, but this is unchanged.  He recently saw [name] and was told that everything looked good re: his prostate cancer.  Hyperlipidemia follow-up: Patient is reportedly following a low-fat, low cholesterol diet. Compliant with medications and denies medication side effects.  Lipids were last checked in November (and reviewed with patient today--slightly elevated TG, otherwise at goal).  DM/IFG--He doesn't check his sugars.He tries to avoid sweets, candy. He orders sweet tea when he eats out (2x/week), never has it at home.  He puts sugar in his coffee (1-2 cups/day). He goes to the eye doctor every six months. He denies any feet concerns, numbness/tingling. He continues to drink 2-3 beers/d (strongly advised to just have 1/d by me at prior visits).   He hasn't had any further episodes of blank stares/unresponsiveness (see neuro notes/visits).  For the last 2-3 weeks, after dinner, he starts sneezing, eyes water, runny nose.  He wakes up fine the next morning.  Denies any new exposures, foods. Only happens in the evenings, after dinner.  PMH, PSH, SH reviewed. Outpatient Encounter Prescriptions as of 07/17/2014  Medication Sig  . Acetaminophen (TYLENOL ARTHRITIS PAIN PO)  Take 2 tablets by mouth 2 (two) times daily.    [name] amLODipine (NORVASC) 2.5 MG tablet Take 1 tablet (2.5 mg total) by mouth daily.  [name] aspirin 325 MG tablet Take 325 mg by mouth daily.  [name] lisinopril (PRINIVIL,ZESTRIL) 20 MG tablet TAKE 1 TABLET BY MOUTH TWICE A DAY  . lovastatin (MEVACOR) 20 MG tablet Take 2 tablets (40 mg total) by mouth at bedtime.  . metoprolol succinate (TOPROL-XL) 50 MG 24 hr tablet TAKE 1 TABLET BY MOUTH TWICE A DAY TAKE WITH OR IMMEDIATELY FOLLOWING A MEAL  . Multiple Vitamins-Minerals (EYE VITAMINS PO) Take 1 tablet by mouth daily. Patient states vitamin is actually called "pressure vision," supplement for macular degeneration.  [name] OVER THE COUNTER MEDICATION Sleep aid, takes it most nights  . [DISCONTINUED] lovastatin (MEVACOR) 20 MG tablet Take 2 tablets (40 mg total) by mouth at bedtime.   No Known Allergies  ROS:  No fevers, chills, cough, shortness of breath, chest pain, headaches, dizziness, GI or GU complaints.  No bleeding, bruising, rash.  +allergic symptoms as per HPI.  No edema, depression or any other concerns.  PHYSICAL EXAM: BP 152/100 mmHg  Pulse 60  Ht 5\' 10"  (1.778 m)  Wt 170 lb (77.111 kg)  BMI 24.39 kg/m2 Pleasant male, appears well, in no distress HEENT: PERRL, conjunctiva clear.  Neck: no lymphadenopathy, thyromegaly or carotid bruit Heart: regular rate and rhythm with murmur noted, unchanged (known AS on echo) Lungs: clear bilaterally Abdomen: soft, nontender, no mass Extremities: no edema, 2+ pulses Neuro: alert and oriented.  Cranial nerves intact, normal strength, gait Skin: no  rashes, bruising Psych: normal mood, affect, hygiene and grooming  Lab Results  Component Value Date   HGBA1C 5.8 07/17/2014     Chemistry      Component Value Date/Time   NA 139 04/10/2014 0925   K 5.0 04/10/2014 0925   CL 101 04/10/2014 0925   CO2 26 04/10/2014 0925   BUN 16 04/10/2014 0925   CREATININE 0.82 04/10/2014 0925   CREATININE 1.0 08/16/2008  1039      Component Value Date/Time   CALCIUM 9.6 04/10/2014 0925   ALKPHOS 35* 04/10/2014 0925   AST 22 04/10/2014 0925   ALT 20 04/10/2014 0925   BILITOT 0.5 04/10/2014 0925     Fasting glucose 122 (04/10/14)  Lab Results  Component Value Date   CHOL 166 04/10/2014   HDL 53 04/10/2014   LDLCALC 79 04/10/2014   TRIG 169* 04/10/2014   CHOLHDL 3.1 04/10/2014   ASSESSENT/PLAN:  Essential hypertension, benign - suboptimally controlled.  add amlodipine.   - Plan: amLODipine (NORVASC) 2.5 MG tablet  Diabetes mellitus without complication - diet controlled.  encouraged daily exercise, weight loss (inches at waist), proper diet - Plan: HgB A1c  Pure hypercholesterolemia - continue statin; reviewed lowfat diet; cut back on beer to 1/day again encouraged. - Plan: lovastatin (MEVACOR) 20 MG tablet  Environmental allergies - symptoms after dinner are c/w allergic reaction. Try and determine what it could be caused by and avoid.  antihistamine prior to dinner for now, then prn  Blood pressures are running too high. He tends to be very sensitive to exercise, with dramatic decreases in BP after exercise, and is often symptomatic with dizziness when BP's drop to around 100-110.  He hasn't been as active lately, but this will be changing as his feet are better and he is walking more, and as the weather improves and he does more yardwork.  Add amlodipine 2.5mg  once daily.  Continue monitoring BP at home.  If BP's drop to <903 systolic after exercise, accompanied by dizziness, then cut back on the lisinopril.  Current dose is 20mg  twice daily, cut back to 20mg  once daly (or 10 twice daily) and continue monitoring.  Fax/mail list of blood pressure/pulse within the month.  If BP consistently remains >160/90 (as they currently are), then the plan is to increase the amlodipine to 5 mg daily (and continue 20mg  twice daily of lisinopril, and in both cases, continue the metoprolol as it is now (can be both  taken once daily, or continue as you are taking now).   Allergy--trial of antihistamine.  Prevention if offending item can be determined.  We need to find out from [facility] which type of pneumonia vaccine he was given in the hospital 09/2012 (to determine if prevnar is needed at his next visit).  I reviewed records scanned into media, and I didn't see documentation.  F/u in 3 months for med check.  Last AWV was 12/2013--if doing well at May visit, perhaps schedule at a 6 month interval (04/2015) for next med check/AWV

## 2014-07-18 NOTE — Progress Notes (Signed)
Called over to [facility] and they told me to fax request to [phone] and they would let me know which pneumonia vaccine patient received on 10/02/12.

## 2014-07-19 ENCOUNTER — Encounter: Payer: Self-pay | Admitting: Family Medicine

## 2014-07-22 ENCOUNTER — Other Ambulatory Visit: Payer: Self-pay | Admitting: *Deleted

## 2014-08-05 ENCOUNTER — Telehealth: Payer: Self-pay | Admitting: Family Medicine

## 2014-08-05 ENCOUNTER — Encounter: Payer: Self-pay | Admitting: *Deleted

## 2014-08-05 NOTE — Telephone Encounter (Signed)
List of BP's reviewed.  Continues to have wide variability in BP's, in the last 2 weeks ranging from 89/46 (after yardwork) up to 178/80.  Average (pt averaged at bottom of page) was 142/70. Review of all values since starting amlodipine 2.5mg  daily last month, shows those values above to be the low and high over the last month.  Mostly ranging 120-160, averaging about 140/70.  Have him continue his current meds.  Only had <786 systolic just that one time, and related to exercise--ensure adequate hydration with yardwork, especially as the weather gets warmer.  F/u as scheduled in May

## 2014-08-05 NOTE — Telephone Encounter (Signed)
Pt mailed list of BP readings, put in your folder.

## 2014-08-05 NOTE — Telephone Encounter (Signed)
Patient advised.

## 2014-09-16 ENCOUNTER — Other Ambulatory Visit: Payer: Self-pay | Admitting: Family Medicine

## 2014-09-19 DIAGNOSIS — H3531 Nonexudative age-related macular degeneration: Secondary | ICD-10-CM | POA: Diagnosis not present

## 2014-09-19 DIAGNOSIS — H2513 Age-related nuclear cataract, bilateral: Secondary | ICD-10-CM | POA: Diagnosis not present

## 2014-09-21 NOTE — H&P (Signed)
PATIENT NAME:  Jeffrey [name], Jeffrey [name] MR#:  [id number] DATE OF BIRTH:  29-Jul-1932  DATE OF ADMISSION:  10/01/2013  PRIMARY CARE PHYSICIAN:  Nonlocal in [place].  REFERRING PHYSICIAN: Dr. Jasmine December.  CHIEF COMPLAINT: Slurred speech this afternoon.  HISTORY OF PRESENT ILLNESS: A 81 year old Caucasian male with a history of hypertension, hyperlipidemia, prostate cancer, alcohol abuse presented to the ED with slurred speech today. The patient is alert, awake, oriented, in no acute distress. The patient said he developed slurred speech, about 3:00 p.m. this afternoon. Slurred speech lasted about 15 minutes, resolved. The patient denies any headache or dizziness and no numbness or weakness. Denies any urinary incontinence. Denies any syncope, loss of consciousness. In the ED, the patient was treated with aspirin and admitted for TIA. According to Dr. Jasmine December, the patient's ABCD2 score is 4.  Needs inpatient admission.   PAST MEDICAL HISTORY: Hypertension, hyperlipidemia, prostate cancer with radiation.   PAST SURGICAL HISTORY: No.   FAMILY HISTORY: Mother has hypertension and diabetes. She is alive at 79 years old.   SOCIAL HISTORY: No smoking, but drinks beer 3 cans every day and no illicit drugs.  REVIEW OF SYSTEMS:  CONSTITUTIONAL: The patient denies any fever or chills. No headache or dizziness. No weakness.  EYES: No double vision or blurry vision.  EARS, NOSE, THROAT: No postnasal drip, slurred speech or dysphagia.  CARDIOVASCULAR: No chest pain, palpitation, orthopnea, nocturnal dyspnea. No leg edema.  PULMONARY: No cough, sputum, shortness of breath or hemoptysis.  GASTROINTESTINAL: No abdominal pain, nausea, vomiting, diarrhea. No melena or bloody stool.  GENITOURINARY: No dysuria, hematuria or incontinence.  SKIN: No rash or jaundice.  NEUROLOGY: No syncope, loss of consciousness or seizure, but has slurred speech for 15 minutes. No dysphagia. No incontinence.  HEMATOLOGIC: No easy bruising  or bleeding.  ENDOCRINE: No polyuria, polydipsia, heat or cold intolerance.   ALLERGIES:  None.  HOME MEDICATIONS:  1.  Tylenol 650 mg p.o. 2 tablets b.i.d.  2.  Toprol-XL 50 mg p.o. b.i.d.  3.  Provision antioxidant 1 cap po once a day.  4.  Lovastatin 40 mg p.o. daily.  5.  Lisinopril 5 mg p.o. daily.  6.  Claritin 10 mg p.o. daily.   PHYSICAL EXAMINATION: VITAL SIGNS: Temperature 98.4, blood pressure 209/90, pulse 90, respirations 18, O2 saturation 97% on room air.  GENERAL: The patient is alert, awake, oriented, in no acute distress.  HEENT: Pupils round, equal and reactive to light and accommodation. NECK: Supple. No JVD or carotid bruit. No lymphadenopathy. No thyromegaly. Moist oral mucosa. Clear oropharynx.  CARDIOVASCULAR: S1, S2 regular rate and rhythm. No murmurs or gallops.  PULMONARY: Bilateral air entry. No wheezing or rales. No use of accessory muscles to breathe.  ABDOMEN: Soft. No distention or tenderness. No organomegaly. Bowel sounds present.  EXTREMITIES: No edema, clubbing or cyanosis. No calf tenderness. Bilateral pedal pulses present.  SKIN: No rash or jaundice.  NEUROLOGIC: AO x 3. No focal deficit. Power 5/5. Sensation intact.   LABORATORY DATA:  Chest x-ray: No active disease.  CBC are in normal range. Glucose 107, BUN 17, creatinine 0.87. Electrolytes were normal. Troponin less than 0.02. Alcohol level 5. Cholesterol 184, triglycerides 400, HDL 55, LDL 49.  CAT scan of head: Atrophy and chronic microvascular ischemia. No acute abnormality.   IMPRESSIONS: 1.  Transient ischemic attack. 2.  Need to rule out cerebrovascular accident.  3.  Hypertensive malignancy.  4.  History of prostate cancer.  5.  Hyperlipidemia.   PLAN OF TREATMENT:  1.  The patient will be admitted to telemetry floor. We will continue telemonitor. We will get an MRI of brain, echocardiograph, carotid duplex.  2.  We will give aspirin, Plavix and statin.  3.  For hypertension  malignancy, we will continue Toprol and increase lisinopril to 10 mg oral daily. We will give her hydralazine IV as needed.  4.  For alcohol abuse, we will start CIWA protocol.  6.  I discussed the patient's condition and plan of treatment with the patient and the patient's wife.  CODE STATUS: The patient wants FULL CODE.   TIME SPENT: About 50 minutes.    ____________________________ Demetrios Loll, MD qc:ce D: 10/01/2013 19:12:49 ET T: 10/01/2013 19:41:34 ET JOB#: [id number]  cc: Demetrios Loll, MD, <Dictator> Demetrios Loll MD ELECTRONICALLY SIGNED 10/01/2013 21:51

## 2014-09-21 NOTE — Discharge Summary (Signed)
PATIENT NAME:  Jeffrey, [name] MR#:  [id number] DATE OF BIRTH:  02/13/1933  DATE OF ADMISSION:  10/01/2013 DATE OF DISCHARGE:  10/02/2013  ADMISSION DIAGNOSIS:  Transient ischemic attack.  DISCHARGE DIAGNOSES: 1.  Transient ischemic attack. 2.  Malignant hypertension.   CONSULTATIONS: None.   IMAGING: 1.  Ultrasound of carotids showed no hemodynamically significant stenosis. 2.  MRI showed no acute infarct.  3.  TSH is 2.45.  4.  Chest x-ray showed no acute cardiopulmonary disease. 5.  CT of the head showed no acute intracranial hemorrhage or CVA.  HOSPITAL COURSE:  This is a 81 year old male with a history of hypertension and hyperlipidemia who presented with aphasia.  For further details, please refer to the H and P.  1.  Transient ischemic attack. The patient's symptoms are likely consistent with a transient ischemic attack. These symptoms resolved after 15 to 20 minutes. He had no symptoms while he was here in the hospital. His MRI was negative for acute stroke. He is not on aspirin, which we recommended. He is already taking statin and his LDL was at goal of 49.  2.  Malignant hypertension. The patient's blood pressure was out of control here, sometimes in the 200s initially. However, it is much improved at 145/72, and he says that he does get white-coat hypertension, but his blood pressure normally stays in the 150s or 160s at home, so we increased his lisinopril. He will need close followup with his primary care physician.  3.  Hyperlipidemia. The patient was continued on statin medications.  4.  History of prostate cancer.   DISCHARGE MEDICATIONS: 1.  Aspirin 325 daily.  2.  Lisinopril 40 mg daily.  3.  Tylenol 650 two tablets b.i.d.  4.  Lovastatin 40 mg daily.  5.  Toprol 50 mg b.i.d.  6.  Provision 1 tablet daily.   DISCHARGE DIET: Low sodium.   DISCHARGE ACTIVITY: As tolerated.  DISCHARGE FOLLOWUP:  The patient will follow up Dr. Tomi Bamberger in 1 week.  The patient was  medically stable for discharge.   TIME SPENT: Approximately 35 minutes.  ____________________________ Donell Beers. Benjie Karvonen, MD spm:ce D: 10/02/2013 12:36:01 ET T: 10/02/2013 20:13:37 ET JOB#: [id number]  cc: Madylin Fairbank P. Benjie Karvonen, MD, <Dictator> Dr. Wilma Flavin P Montavious Wierzba MD ELECTRONICALLY SIGNED 10/03/2013 13:43

## 2014-10-03 ENCOUNTER — Other Ambulatory Visit: Payer: Self-pay | Admitting: *Deleted

## 2014-10-03 ENCOUNTER — Telehealth: Payer: Self-pay | Admitting: *Deleted

## 2014-10-03 ENCOUNTER — Other Ambulatory Visit: Payer: Self-pay | Admitting: Family Medicine

## 2014-10-03 MED ORDER — AMLODIPINE BESYLATE 2.5 MG PO TABS: ORAL_TABLET | ORAL | Status: DC

## 2014-10-03 NOTE — Telephone Encounter (Signed)
Patient called and needed refills on amlodipine and metoprolol, he has appt on 5/19 and could not make it until then. He wanted me to let you know that he stopped taking the lisinopril for now because his systolic number was going below 100. BP's are now looking good, just wanted me to tell you.

## 2014-10-03 NOTE — Telephone Encounter (Signed)
Noted.  Ok to refill (looks like you did already).

## 2014-10-13 ENCOUNTER — Other Ambulatory Visit: Payer: Self-pay | Admitting: Family Medicine

## 2014-10-17 ENCOUNTER — Encounter: Payer: Self-pay | Admitting: Family Medicine

## 2014-10-17 ENCOUNTER — Ambulatory Visit (INDEPENDENT_AMBULATORY_CARE_PROVIDER_SITE_OTHER): Payer: Medicare Other | Admitting: Family Medicine

## 2014-10-17 VITALS — BP 170/100 | HR 80 | Ht 70.0 in | Wt 175.0 lb

## 2014-10-17 DIAGNOSIS — E7800 Pure hypercholesterolemia, unspecified: Secondary | ICD-10-CM

## 2014-10-17 DIAGNOSIS — Z23 Encounter for immunization: Secondary | ICD-10-CM | POA: Diagnosis not present

## 2014-10-17 DIAGNOSIS — E119 Type 2 diabetes mellitus without complications: Secondary | ICD-10-CM

## 2014-10-17 DIAGNOSIS — I10 Essential (primary) hypertension: Secondary | ICD-10-CM | POA: Diagnosis not present

## 2014-10-17 LAB — HEPATIC FUNCTION PANEL
ALBUMIN: 4.8 g/dL (ref 3.5–5.2)
ALK PHOS: 37 U/L — AB (ref 39–117)
ALT: 24 U/L (ref 0–53)
AST: 25 U/L (ref 0–37)
BILIRUBIN DIRECT: 0.1 mg/dL (ref 0.0–0.3)
BILIRUBIN TOTAL: 0.5 mg/dL (ref 0.2–1.2)
Indirect Bilirubin: 0.4 mg/dL (ref 0.2–1.2)
Total Protein: 8 g/dL (ref 6.0–8.3)

## 2014-10-17 LAB — TSH: TSH: 1.409 u[IU]/mL (ref 0.350–4.500)

## 2014-10-17 LAB — POCT GLYCOSYLATED HEMOGLOBIN (HGB A1C): HEMOGLOBIN A1C: 5.8

## 2014-10-17 MED ORDER — AMLODIPINE BESYLATE 2.5 MG PO TABS: ORAL_TABLET | ORAL | Status: DC

## 2014-10-17 NOTE — Progress Notes (Signed)
Chief Complaint  Patient presents with  . Diabetes    fasting med check. (patient is not fasting)   Patient presents for follow up on hypertension. Amlodipine 2.5mg  once daily was added at his last visit.Blood pressures dropped, so he cut back the dose of lisinopril as directed, and ultimately, within 10 days of starting the amlodipine, stopped the lisinopril entirely.  He still notes some low blood pressures, but no associated dizziness (just very mild, with standing quickly).  BP's range from 104-183/52-82, mostly ranging 110-140/60.  Once he had 88/45. Prior to stopping the lisinopril he had been seeing <811 systolic more frequently, related to exercise. He denies any side effects from the amlodipine. He hasn't had any further episodes of blank stares/unresponsiveness (see neuro notes/visits); BP tends to be lower after exercise, and when sitting out in the sun.  DM/IFG--He doesn't check his sugars.  He goes to the eye doctor every six months. He denies any feet concerns, numbness/tingling. He cut back from 2-3 to 2 beers/d and started drinking more coffee instead.  3 cups/day with cream and sugar.  Hyperlipidemia:  Compliant with medications; denies side effects. He admits to eating a lot of fried foods.  Last lipids were 6 months ago: Lab Results  Component Value Date   CHOL 166 04/10/2014   HDL 53 04/10/2014   LDLCALC 79 04/10/2014   TRIG 169* 04/10/2014   CHOLHDL 3.1 04/10/2014   Allergies:  He has been taking claritin most days, with improved symptoms.  PMH, PSH, SH reviewed.  FH unchanged  Outpatient Encounter Prescriptions as of 10/17/2014  Medication Sig  . Acetaminophen (TYLENOL ARTHRITIS PAIN PO) Take 2 tablets by mouth 2 (two) times daily.    [name] amLODipine (NORVASC) 2.5 MG tablet TAKE 1 TABLET (2.5 MG TOTAL) BY MOUTH DAILY.  [name] aspirin 81 MG tablet Take 162 mg by mouth daily.  [name] lovastatin (MEVACOR) 20 MG tablet Take 2 tablets (40 mg total) by mouth at bedtime.  .  metoprolol succinate (TOPROL-XL) 50 MG 24 hr tablet TAKE 1 TABLET BY MOUTH TWICE A DAY TAKE WITH OR IMMEDIATELY FOLLOWING A MEAL  . Multiple Vitamins-Minerals (EYE VITAMINS PO) Take 1 tablet by mouth daily. Patient states vitamin is actually called "pressure vision," supplement for macular degeneration.  [name] OVER THE COUNTER MEDICATION Sleep aid, takes it most nights  . [DISCONTINUED] amLODipine (NORVASC) 2.5 MG tablet TAKE 1 TABLET (2.5 MG TOTAL) BY MOUTH DAILY.  . [DISCONTINUED] lisinopril (PRINIVIL,ZESTRIL) 20 MG tablet TAKE 1 TABLET BY MOUTH TWICE A DAY (Patient not taking: Reported on 10/17/2014)   No facility-administered encounter medications on file as of 10/17/2014.   No Known Allergies  ROS : no fevers, chills, headaches, chest pain, palpitations, nausea, vomiting, bowel changes, urinary complaints.  +mild allergy symptoms, controlled with antihistamines.  Denies shortness of breath, cough, edema, bleeding, bruising, rash.  occ indigestion  PHYSICAL EXAM: BP 170/100 mmHg  Pulse 80  Ht 5\' 10"  (1.778 m)  Wt 175 lb (79.379 kg)  BMI 25.11 kg/m2 164/90 on repeat by MD Pleasant male, appears well, in no distress HEENT: PERRL, conjunctiva clear.  Neck: no lymphadenopathy, thyromegaly Heart: regular rate and rhythm with murmur noted, unchanged (known AS on echo) Lungs: clear bilaterally Abdomen: soft, nontender, no mass Extremities: no edema, 2+ pulses Neuro: alert and oriented. Cranial nerves intact, normal strength, gait Skin: no rashes, bruising Psych: normal mood, affect, hygiene and grooming  Lab Results  Component Value Date   HGBA1C 5.8 10/17/2014   ASSESSMENT/PLAN:  Essential hypertension, benign - White coat component.  Overall controlled on current regimen. stay off lisinopril for now - Plan: amLODipine (NORVASC) 2.5 MG tablet  Diabetes mellitus without complication - Plan: HgB A1c  Pure hypercholesterolemia - nonfasting today; elevated TG in past.  reviewed  lowfat diet, add fish oil.  - Plan: Hepatic function panel  Type 2 diabetes mellitus without complication - overall controlled by diet.  diet reviewed, daily exercise; weight loss encouraged. - Plan: TSH  Immunization due - Plan: Pneumococcal conjugate vaccine 13-valent   Ultimately he might need to add back in a very low dose of lisinopril (due to DM/IFG).  Not needed at this point, and likely wouldn't be tolerated, unless one of the other medication doses (ie metoprolol) was decreased.   Continue current medication regimen (amlodipine and metoprolol), and monitoring BP  Patient is not fasting today. Was due for lipids (due to borderline TG, 169 6 months ago). Needs to cut back on fried foods.  Fish oil recommended.  Check LFT's today, TSH  Prevnar-13  Please try and cut back on fried foods--this contributes to elevated triglycerides (and harder to lose weight).  Try eating more grilled/broiled/baked foods rather than fried. Add omega-3 fish oil 3000mg  daily (1000 or 1200mg  capsules, three times daily with meals).  It is okay to drink coffee, but make sure that you are also drinking plenty of water. I'd prefer you use an artificial sweetener rather than a lot of sugar (small amount is okay), and a lowfat creamer  6 months--fasting  Med check, AWV

## 2014-10-17 NOTE — Patient Instructions (Addendum)
  Please try and cut back on fried foods--this contributes to elevated triglycerides (and harder to lose weight).  Try eating more grilled/broiled/baked foods rather than fried. Add omega-3 fish oil 3000mg  daily (1000 or 1200mg  capsules, three times daily with meals).  Continue current medication regimen (amlodipine and metoprolol), and monitoring BP  It is okay to drink coffee, but make sure that you are also drinking plenty of water. I'd prefer you use an artificial sweetener rather than a lot of sugar (small amount is okay), and a lowfat creamer

## 2014-10-30 DIAGNOSIS — A770 Spotted fever due to Rickettsia rickettsii: Secondary | ICD-10-CM

## 2014-10-30 HISTORY — DX: Spotted fever due to Rickettsia rickettsii: A77.0

## 2014-11-25 ENCOUNTER — Encounter: Payer: Self-pay | Admitting: Family Medicine

## 2014-11-25 ENCOUNTER — Ambulatory Visit (INDEPENDENT_AMBULATORY_CARE_PROVIDER_SITE_OTHER): Payer: Medicare Other | Admitting: Family Medicine

## 2014-11-25 ENCOUNTER — Ambulatory Visit
Admission: RE | Admit: 2014-11-25 | Discharge: 2014-11-25 | Disposition: A | Discharge status: Home / Self Care | Payer: Medicare Other | Source: Ambulatory Visit | Attending: Family Medicine | Admitting: Family Medicine

## 2014-11-25 VITALS — BP 132/82 | HR 72 | Temp 102.1°F | Wt 172.6 lb

## 2014-11-25 DIAGNOSIS — R509 Fever, unspecified: Secondary | ICD-10-CM

## 2014-11-25 DIAGNOSIS — Z87891 Personal history of nicotine dependence: Secondary | ICD-10-CM | POA: Diagnosis not present

## 2014-11-25 DIAGNOSIS — I700 Atherosclerosis of aorta: Secondary | ICD-10-CM | POA: Insufficient documentation

## 2014-11-25 HISTORY — DX: Atherosclerosis of aorta: I70.0

## 2014-11-25 LAB — CBC WITH DIFFERENTIAL/PLATELET
Basophils Absolute: 0 10*3/uL (ref 0.0–0.1)
Basophils Relative: 0 % (ref 0–1)
EOS PCT: 0 % (ref 0–5)
Eosinophils Absolute: 0 10*3/uL (ref 0.0–0.7)
HEMATOCRIT: 39.8 % (ref 39.0–52.0)
Hemoglobin: 13.3 g/dL (ref 13.0–17.0)
LYMPHS PCT: 6 % — AB (ref 12–46)
Lymphs Abs: 0.3 10*3/uL — ABNORMAL LOW (ref 0.7–4.0)
MCH: 32 pg (ref 26.0–34.0)
MCHC: 33.4 g/dL (ref 30.0–36.0)
MCV: 95.9 fL (ref 78.0–100.0)
MONOS PCT: 3 % (ref 3–12)
MPV: 10.5 fL (ref 8.6–12.4)
Monocytes Absolute: 0.2 10*3/uL (ref 0.1–1.0)
Neutro Abs: 4.8 10*3/uL (ref 1.7–7.7)
Neutrophils Relative %: 91 % — ABNORMAL HIGH (ref 43–77)
Platelets: 137 10*3/uL — ABNORMAL LOW (ref 150–400)
RBC: 4.15 MIL/uL — ABNORMAL LOW (ref 4.22–5.81)
RDW: 13.3 % (ref 11.5–15.5)
WBC: 5.3 10*3/uL (ref 4.0–10.5)

## 2014-11-25 LAB — POCT URINALYSIS DIPSTICK
BILIRUBIN UA: NEGATIVE
Glucose, UA: NEGATIVE
Ketones, UA: NEGATIVE
Leukocytes, UA: NEGATIVE
Nitrite, UA: NEGATIVE
Urobilinogen, UA: NEGATIVE
pH, UA: 6

## 2014-11-25 NOTE — Progress Notes (Signed)
Chief Complaint  Patient presents with  . fever    fever 102.5-103- nauseaed, no appetite. tried tynelol.   3 days ago was a "stressful day".  It was his mother's funeral. She was 102, had fallen a few weeks prior and hit her head, and never really recovered (she had dementia).  2 days ago he started "feeling bad", feeling chilled/shaky. He continues to feel bad.  Tylenol or aspirin temporarily gets the fever down and he feels better, but symptoms recur.  He feels lethargic when fever is high. He feels a little thirstier than normal.  He has a runny nose, clear. Denies ear pain, sinus pain, sore throat, cough. Denies any myalgias or arthralgias. He has some leakage of urine since prostate treatment, denies any change--no dysuria, hematuria, abdominal pain. Denies chest pain, palpitations, shortness of breath. He has decreased appetite.  Denies nausea, vomiting.  Slightly constipated x 2 days.  No diarrhea. No abdominal pain, bowel changes, blood in stool.  Denies bleeding, bruising, rash.   PMH, PSH, SH and FH were reviewed and updated today  Outpatient Encounter Prescriptions as of 11/25/2014  Medication Sig  . Acetaminophen (TYLENOL ARTHRITIS PAIN PO) Take 2 tablets by mouth 2 (two) times daily.    [name] amLODipine (NORVASC) 2.5 MG tablet TAKE 1 TABLET (2.5 MG TOTAL) BY MOUTH DAILY.  [name] aspirin 81 MG tablet Take 162 mg by mouth daily.  [name] lovastatin (MEVACOR) 20 MG tablet Take 2 tablets (40 mg total) by mouth at bedtime.  . metoprolol succinate (TOPROL-XL) 50 MG 24 hr tablet TAKE 1 TABLET BY MOUTH TWICE A DAY TAKE WITH OR IMMEDIATELY FOLLOWING A MEAL  . Multiple Vitamins-Minerals (EYE VITAMINS PO) Take 1 tablet by mouth daily. Patient states vitamin is actually called "pressure vision," supplement for macular degeneration.  [name] OVER THE COUNTER MEDICATION Sleep aid, takes it most nights   No facility-administered encounter medications on file as of 11/25/2014.   No Known Allergies  ROS:  See HPI  above. Entirely negative other than fever and chills.  PHYSICAL EXAM: BP 132/82 mmHg  Pulse 72  Temp(Src) 102.1 F (38.9 C) (Tympanic)  Wt 172 lb 9.6 oz (78.291 kg)  Mildly ill appearing male, unshaven, slightly shaky.  Accompanied by his wife. HEENT: PERRL, EOMI, conjunctiva clear. TM's and EAC's normal. Nasal mucosa is fairly normal, clear mucus. Sinuses nontender. OP without erythema, moist mucus membranes Neck: no lymphadenopathy or mass Heart: Loud murmur, unchanged; tachycardic (rate about a 100, regular) Lungs: clear bilaterally Back: no CVA tenderness or spinal tenderness Abdomen: soft, normal bowel sounds, nontender, no mass Skin: no rashes Extremities: no edema, clubbing or cyanosis. Neuro: alert and oriented. Cranial nerves intact. Normal strength, gait  Urine dip: protein, ketones, sm blood. No leuks and nit  CXR: FINDINGS: Midline trachea. Mild cardiomegaly. Extensive aortic atherosclerosis. No pleural effusion or pneumothorax. Mild biapical pleural thickening. Mild scarring at the left lung base.  IMPRESSION: No acute cardiopulmonary disease. Atherosclerosis.  ASSESSMENT/PLAN:  Fever and chills - ill appearing, stable VS; febrile. Given age, check CBC, blood cx, urine and CXR. Fever control. f/u by phone in [place]; OV if F persists, or repeat labs needed - Plan: Urine culture, CBC with Differential/Platelet, Culture, Blood, Single Set Only, DG Chest 2 View, POCT urinalysis dipstick  Suspect viral given lack of findings other than clear runny nose. If CBC concerning, may need f/u later this week (vs f/u by phone later this week if labs look okay, to ensure fever resolving).  Fever  control reviewed. Ibuprofen 400mg  given by Gabriel Cirri. Encouraged adequate fluid intake, to eat even if not hungry (can be liquids, soup)  HTN well controlled

## 2014-11-25 NOTE — Patient Instructions (Signed)
Make sure to drink plenty of fluids (having a fever can dehydrate you. Please try and eat something in order to maintain your strength--it can just be fluids, or chicken soup.  Go to [facility] now for chest x-ray. We will be in touch with your bloodwork tomorrow.    Return later this week for re-evaluation if you are not improving. Continue to use tylenol for fever, but you may also use ibuprofen or Aleve in between the tylenol doses, if needed.

## 2014-11-26 ENCOUNTER — Telehealth: Payer: Self-pay | Admitting: Family Medicine

## 2014-11-26 NOTE — Telephone Encounter (Signed)
Dr. Tomi Bamberger, the lab called and said the patients lab results came back positive for Gram positive cocci in clusters only in the anaerobic bottle only.

## 2014-11-26 NOTE — Telephone Encounter (Signed)
I spoke with Dr. Lita Mains (ID), who felt it was okay to wait until further info comes back (prelim culture, if coag -/+). I spoke with pt's wife, [name] who reports he is overall doing the same--he is still febrile, and the aleve and tylenol are both needed to keep it down, not always getting it down to normal. He seems weaker, but admits he really hasn't been eating or drinking much. I confirmed that he has not had any recent dental work, procedures.  He isn't having any new symptoms (headache/chest pain, shortness of breath, etc).  We will be in touch again tomorrow with pt/wife, after add'l information is received.  Advised to go to ER if worsening, but strongly encouraged to push fluids

## 2014-11-27 ENCOUNTER — Encounter (HOSPITAL_COMMUNITY): Payer: Self-pay | Admitting: Family Medicine

## 2014-11-27 ENCOUNTER — Telehealth: Payer: Self-pay | Admitting: *Deleted

## 2014-11-27 ENCOUNTER — Emergency Department (HOSPITAL_COMMUNITY): Payer: Medicare Other

## 2014-11-27 ENCOUNTER — Inpatient Hospital Stay (HOSPITAL_COMMUNITY)
Admission: EM | Admit: 2014-11-27 | Discharge: 2014-12-03 | DRG: 871 | Disposition: A | Discharge status: Rehabilitation Facility | Payer: Medicare Other | Attending: Internal Medicine | Admitting: Internal Medicine

## 2014-11-27 DIAGNOSIS — I5043 Acute on chronic combined systolic (congestive) and diastolic (congestive) heart failure: Secondary | ICD-10-CM | POA: Insufficient documentation

## 2014-11-27 DIAGNOSIS — J984 Other disorders of lung: Secondary | ICD-10-CM | POA: Diagnosis not present

## 2014-11-27 DIAGNOSIS — I700 Atherosclerosis of aorta: Secondary | ICD-10-CM | POA: Diagnosis not present

## 2014-11-27 DIAGNOSIS — Z87891 Personal history of nicotine dependence: Secondary | ICD-10-CM

## 2014-11-27 DIAGNOSIS — E1165 Type 2 diabetes mellitus with hyperglycemia: Secondary | ICD-10-CM | POA: Diagnosis present

## 2014-11-27 DIAGNOSIS — I491 Atrial premature depolarization: Secondary | ICD-10-CM | POA: Diagnosis not present

## 2014-11-27 DIAGNOSIS — I6522 Occlusion and stenosis of left carotid artery: Secondary | ICD-10-CM | POA: Diagnosis present

## 2014-11-27 DIAGNOSIS — E7800 Pure hypercholesterolemia, unspecified: Secondary | ICD-10-CM | POA: Diagnosis present

## 2014-11-27 DIAGNOSIS — G311 Senile degeneration of brain, not elsewhere classified: Secondary | ICD-10-CM | POA: Diagnosis not present

## 2014-11-27 DIAGNOSIS — I350 Nonrheumatic aortic (valve) stenosis: Secondary | ICD-10-CM | POA: Diagnosis present

## 2014-11-27 DIAGNOSIS — H3530 Unspecified macular degeneration: Secondary | ICD-10-CM | POA: Diagnosis present

## 2014-11-27 DIAGNOSIS — I10 Essential (primary) hypertension: Secondary | ICD-10-CM | POA: Diagnosis present

## 2014-11-27 DIAGNOSIS — Z0189 Encounter for other specified special examinations: Secondary | ICD-10-CM

## 2014-11-27 DIAGNOSIS — R509 Fever, unspecified: Secondary | ICD-10-CM | POA: Diagnosis present

## 2014-11-27 DIAGNOSIS — R410 Disorientation, unspecified: Secondary | ICD-10-CM | POA: Diagnosis not present

## 2014-11-27 DIAGNOSIS — R0602 Shortness of breath: Secondary | ICD-10-CM | POA: Diagnosis not present

## 2014-11-27 DIAGNOSIS — R0603 Acute respiratory distress: Secondary | ICD-10-CM | POA: Insufficient documentation

## 2014-11-27 DIAGNOSIS — Z8546 Personal history of malignant neoplasm of prostate: Secondary | ICD-10-CM

## 2014-11-27 DIAGNOSIS — I480 Paroxysmal atrial fibrillation: Secondary | ICD-10-CM | POA: Diagnosis not present

## 2014-11-27 DIAGNOSIS — E1169 Type 2 diabetes mellitus with other specified complication: Secondary | ICD-10-CM

## 2014-11-27 DIAGNOSIS — I639 Cerebral infarction, unspecified: Secondary | ICD-10-CM | POA: Diagnosis not present

## 2014-11-27 DIAGNOSIS — G250 Essential tremor: Secondary | ICD-10-CM | POA: Diagnosis present

## 2014-11-27 DIAGNOSIS — R0600 Dyspnea, unspecified: Secondary | ICD-10-CM

## 2014-11-27 DIAGNOSIS — G9340 Encephalopathy, unspecified: Secondary | ICD-10-CM | POA: Diagnosis not present

## 2014-11-27 DIAGNOSIS — Z7982 Long term (current) use of aspirin: Secondary | ICD-10-CM

## 2014-11-27 DIAGNOSIS — E785 Hyperlipidemia, unspecified: Secondary | ICD-10-CM | POA: Diagnosis present

## 2014-11-27 DIAGNOSIS — E876 Hypokalemia: Secondary | ICD-10-CM | POA: Diagnosis not present

## 2014-11-27 DIAGNOSIS — A419 Sepsis, unspecified organism: Principal | ICD-10-CM | POA: Diagnosis present

## 2014-11-27 DIAGNOSIS — I429 Cardiomyopathy, unspecified: Secondary | ICD-10-CM | POA: Diagnosis present

## 2014-11-27 DIAGNOSIS — D696 Thrombocytopenia, unspecified: Secondary | ICD-10-CM | POA: Diagnosis not present

## 2014-11-27 DIAGNOSIS — A770 Spotted fever due to Rickettsia rickettsii: Secondary | ICD-10-CM | POA: Insufficient documentation

## 2014-11-27 DIAGNOSIS — E871 Hypo-osmolality and hyponatremia: Secondary | ICD-10-CM | POA: Diagnosis not present

## 2014-11-27 DIAGNOSIS — C61 Malignant neoplasm of prostate: Secondary | ICD-10-CM | POA: Diagnosis present

## 2014-11-27 DIAGNOSIS — N179 Acute kidney failure, unspecified: Secondary | ICD-10-CM | POA: Insufficient documentation

## 2014-11-27 DIAGNOSIS — IMO0001 Reserved for inherently not codable concepts without codable children: Secondary | ICD-10-CM | POA: Insufficient documentation

## 2014-11-27 DIAGNOSIS — Z79899 Other long term (current) drug therapy: Secondary | ICD-10-CM

## 2014-11-27 DIAGNOSIS — R7303 Prediabetes: Secondary | ICD-10-CM | POA: Insufficient documentation

## 2014-11-27 DIAGNOSIS — R0682 Tachypnea, not elsewhere classified: Secondary | ICD-10-CM

## 2014-11-27 DIAGNOSIS — R6520 Severe sepsis without septic shock: Secondary | ICD-10-CM | POA: Diagnosis not present

## 2014-11-27 DIAGNOSIS — R6521 Severe sepsis with septic shock: Secondary | ICD-10-CM | POA: Diagnosis present

## 2014-11-27 DIAGNOSIS — I6350 Cerebral infarction due to unspecified occlusion or stenosis of unspecified cerebral artery: Secondary | ICD-10-CM | POA: Diagnosis not present

## 2014-11-27 DIAGNOSIS — G459 Transient cerebral ischemic attack, unspecified: Secondary | ICD-10-CM | POA: Diagnosis present

## 2014-11-27 DIAGNOSIS — J690 Pneumonitis due to inhalation of food and vomit: Secondary | ICD-10-CM | POA: Diagnosis not present

## 2014-11-27 DIAGNOSIS — Z8673 Personal history of transient ischemic attack (TIA), and cerebral infarction without residual deficits: Secondary | ICD-10-CM | POA: Diagnosis not present

## 2014-11-27 DIAGNOSIS — D649 Anemia, unspecified: Secondary | ICD-10-CM | POA: Diagnosis present

## 2014-11-27 DIAGNOSIS — Z4682 Encounter for fitting and adjustment of non-vascular catheter: Secondary | ICD-10-CM | POA: Diagnosis not present

## 2014-11-27 DIAGNOSIS — R479 Unspecified speech disturbances: Secondary | ICD-10-CM

## 2014-11-27 DIAGNOSIS — J9811 Atelectasis: Secondary | ICD-10-CM | POA: Diagnosis not present

## 2014-11-27 DIAGNOSIS — R000 Tachycardia, unspecified: Secondary | ICD-10-CM

## 2014-11-27 DIAGNOSIS — J9601 Acute respiratory failure with hypoxia: Secondary | ICD-10-CM | POA: Diagnosis not present

## 2014-11-27 DIAGNOSIS — R5381 Other malaise: Secondary | ICD-10-CM | POA: Diagnosis not present

## 2014-11-27 DIAGNOSIS — Z978 Presence of other specified devices: Secondary | ICD-10-CM

## 2014-11-27 DIAGNOSIS — G458 Other transient cerebral ischemic attacks and related syndromes: Secondary | ICD-10-CM | POA: Diagnosis not present

## 2014-11-27 DIAGNOSIS — R011 Cardiac murmur, unspecified: Secondary | ICD-10-CM | POA: Diagnosis present

## 2014-11-27 DIAGNOSIS — J9600 Acute respiratory failure, unspecified whether with hypoxia or hypercapnia: Secondary | ICD-10-CM | POA: Diagnosis not present

## 2014-11-27 DIAGNOSIS — R531 Weakness: Secondary | ICD-10-CM | POA: Diagnosis not present

## 2014-11-27 DIAGNOSIS — I6381 Other cerebral infarction due to occlusion or stenosis of small artery: Secondary | ICD-10-CM | POA: Insufficient documentation

## 2014-11-27 DIAGNOSIS — Z923 Personal history of irradiation: Secondary | ICD-10-CM | POA: Diagnosis not present

## 2014-11-27 DIAGNOSIS — E119 Type 2 diabetes mellitus without complications: Secondary | ICD-10-CM

## 2014-11-27 HISTORY — DX: Encephalopathy, unspecified: G93.40

## 2014-11-27 HISTORY — DX: Cerebral infarction, unspecified: I63.9

## 2014-11-27 HISTORY — DX: Acute on chronic combined systolic (congestive) and diastolic (congestive) heart failure: I50.43

## 2014-11-27 HISTORY — DX: Spotted fever due to Rickettsia rickettsii: A77.0

## 2014-11-27 HISTORY — DX: Other cerebral infarction due to occlusion or stenosis of small artery: I63.81

## 2014-11-27 LAB — COMPREHENSIVE METABOLIC PANEL
ALT: 48 U/L (ref 17–63)
AST: 84 U/L — AB (ref 15–41)
Albumin: 3.2 g/dL — ABNORMAL LOW (ref 3.5–5.0)
Alkaline Phosphatase: 53 U/L (ref 38–126)
Anion gap: 13 (ref 5–15)
BILIRUBIN TOTAL: 1 mg/dL (ref 0.3–1.2)
BUN: 31 mg/dL — ABNORMAL HIGH (ref 6–20)
CO2: 22 mmol/L (ref 22–32)
Calcium: 8.2 mg/dL — ABNORMAL LOW (ref 8.9–10.3)
Chloride: 97 mmol/L — ABNORMAL LOW (ref 101–111)
Creatinine, Ser: 1.07 mg/dL (ref 0.61–1.24)
GFR calc Af Amer: >60 mL/min (ref >60)
GLUCOSE: 167 mg/dL — AB (ref 65–99)
POTASSIUM: 4.1 mmol/L (ref 3.5–5.1)
Sodium: 132 mmol/L — ABNORMAL LOW (ref 135–145)
TOTAL PROTEIN: 6.7 g/dL (ref 6.5–8.1)

## 2014-11-27 LAB — CBC WITH DIFFERENTIAL/PLATELET
BASOS ABS: 0.1 10*3/uL (ref 0.0–0.1)
BASOS PCT: 1 % (ref 0–1)
Eosinophils Absolute: 0 10*3/uL (ref 0.0–0.7)
Eosinophils Relative: 0 % (ref 0–5)
HCT: 37.8 % — ABNORMAL LOW (ref 39.0–52.0)
Hemoglobin: 13 g/dL (ref 13.0–17.0)
Lymphocytes Relative: 8 % — ABNORMAL LOW (ref 12–46)
Lymphs Abs: 0.6 10*3/uL — ABNORMAL LOW (ref 0.7–4.0)
MCH: 33.1 pg (ref 26.0–34.0)
MCHC: 34.4 g/dL (ref 30.0–36.0)
MCV: 96.2 fL (ref 78.0–100.0)
MONOS PCT: 6 % (ref 3–12)
Monocytes Absolute: 0.4 10*3/uL (ref 0.1–1.0)
NEUTROS ABS: 6.7 10*3/uL (ref 1.7–7.7)
Neutrophils Relative %: 86 % — ABNORMAL HIGH (ref 43–77)
PLATELETS: 94 10*3/uL — AB (ref 150–400)
RBC: 3.93 MIL/uL — ABNORMAL LOW (ref 4.22–5.81)
RDW: 13.2 % (ref 11.5–15.5)
WBC: 7.8 10*3/uL (ref 4.0–10.5)

## 2014-11-27 LAB — PROCALCITONIN: PROCALCITONIN: 1.62 ng/mL

## 2014-11-27 LAB — URINALYSIS, ROUTINE W REFLEX MICROSCOPIC
Glucose, UA: NEGATIVE mg/dL
KETONES UR: 15 mg/dL — AB
Leukocytes, UA: NEGATIVE
Nitrite: NEGATIVE
PROTEIN: 100 mg/dL — AB
Specific Gravity, Urine: 1.027 (ref 1.005–1.030)
UROBILINOGEN UA: 0.2 mg/dL (ref 0.0–1.0)
pH: 5.5 (ref 5.0–8.0)

## 2014-11-27 LAB — URINE MICROSCOPIC-ADD ON

## 2014-11-27 LAB — URINE CULTURE

## 2014-11-27 LAB — I-STAT CG4 LACTIC ACID, ED: Lactic Acid, Venous: 2.18 mmol/L (ref 0.5–2.0)

## 2014-11-27 LAB — PROTIME-INR
INR: 1.03 (ref 0.00–1.49)
Prothrombin Time: 13.7 seconds (ref 11.6–15.2)

## 2014-11-27 LAB — LACTATE DEHYDROGENASE: LDH: 348 U/L — ABNORMAL HIGH (ref 98–192)

## 2014-11-27 LAB — LACTIC ACID, PLASMA: LACTIC ACID, VENOUS: 1.1 mmol/L (ref 0.5–2.0)

## 2014-11-27 LAB — APTT: aPTT: 35 seconds (ref 24–37)

## 2014-11-27 MED ORDER — SODIUM CHLORIDE 0.9 % IV BOLUS (SEPSIS)
500.0000 mL | INTRAVENOUS | Status: AC
  Administered 2014-11-27: 500 mL via INTRAVENOUS

## 2014-11-27 MED ORDER — DEXTROSE 5 % IV SOLN
2.0000 g | INTRAVENOUS | Status: DC
  Administered 2014-11-27: 2 g via INTRAVENOUS
  Filled 2014-11-27: qty 2

## 2014-11-27 MED ORDER — ONDANSETRON HCL 4 MG/2ML IJ SOLN: 4.0000 mg | Freq: Four times a day (QID) | INTRAMUSCULAR | Status: DC | PRN

## 2014-11-27 MED ORDER — ONDANSETRON HCL 4 MG PO TABS: 4.0000 mg | ORAL_TABLET | Freq: Four times a day (QID) | ORAL | Status: DC | PRN

## 2014-11-27 MED ORDER — METOPROLOL SUCCINATE ER 50 MG PO TB24
50.0000 mg | ORAL_TABLET | Freq: Two times a day (BID) | ORAL | Status: DC
  Administered 2014-11-27 – 2014-11-28 (×2): 50 mg via ORAL
  Filled 2014-11-27 (×3): qty 1

## 2014-11-27 MED ORDER — NICOTINE 21 MG/24HR TD PT24
21.0000 mg | MEDICATED_PATCH | Freq: Every day | TRANSDERMAL | Status: DC
  Filled 2014-11-27: qty 1

## 2014-11-27 MED ORDER — DEXTROSE 5 % IV SOLN
200.0000 mg | Freq: Two times a day (BID) | INTRAVENOUS | Status: DC
  Administered 2014-11-27 – 2014-11-28 (×3): 200 mg via INTRAVENOUS
  Filled 2014-11-27 (×6): qty 200

## 2014-11-27 MED ORDER — NAPROXEN 250 MG PO TABS
250.0000 mg | ORAL_TABLET | Freq: Two times a day (BID) | ORAL | Status: DC | PRN
  Filled 2014-11-27: qty 1

## 2014-11-27 MED ORDER — ACETAMINOPHEN 325 MG PO TABS
650.0000 mg | ORAL_TABLET | Freq: Once | ORAL | Status: AC
  Administered 2014-11-27: 650 mg via ORAL
  Filled 2014-11-27: qty 2

## 2014-11-27 MED ORDER — SODIUM CHLORIDE 0.9 % IJ SOLN
3.0000 mL | Freq: Two times a day (BID) | INTRAMUSCULAR | Status: DC
  Administered 2014-11-29 – 2014-12-03 (×9): 3 mL via INTRAVENOUS

## 2014-11-27 MED ORDER — DOXYLAMINE SUCCINATE (SLEEP) 25 MG PO TABS
25.0000 mg | ORAL_TABLET | Freq: Every evening | ORAL | Status: DC | PRN
  Filled 2014-11-27: qty 1

## 2014-11-27 MED ORDER — PIPERACILLIN-TAZOBACTAM 3.375 G IVPB
3.3750 g | Freq: Three times a day (TID) | INTRAVENOUS | Status: DC
  Administered 2014-11-28: 3.375 g via INTRAVENOUS
  Filled 2014-11-27 (×3): qty 50

## 2014-11-27 MED ORDER — PIPERACILLIN-TAZOBACTAM 3.375 G IVPB
3.3750 g | Freq: Once | INTRAVENOUS | Status: AC
  Administered 2014-11-27: 3.375 g via INTRAVENOUS
  Filled 2014-11-27: qty 50

## 2014-11-27 MED ORDER — PRAVASTATIN SODIUM 40 MG PO TABS
40.0000 mg | ORAL_TABLET | Freq: Every day | ORAL | Status: DC
  Filled 2014-11-27: qty 1

## 2014-11-27 MED ORDER — PROSIGHT PO TABS
1.0000 | ORAL_TABLET | Freq: Every day | ORAL | Status: DC
  Administered 2014-11-27 – 2014-11-28 (×2): 1 via ORAL
  Filled 2014-11-27 (×2): qty 1

## 2014-11-27 MED ORDER — SODIUM CHLORIDE 0.9 % IV BOLUS (SEPSIS): 2000.0000 mL | Freq: Once | INTRAVENOUS | Status: DC

## 2014-11-27 MED ORDER — VANCOMYCIN HCL IN DEXTROSE 750-5 MG/150ML-% IV SOLN
750.0000 mg | Freq: Two times a day (BID) | INTRAVENOUS | Status: DC
  Administered 2014-11-27 – 2014-11-28 (×2): 750 mg via INTRAVENOUS
  Filled 2014-11-27 (×3): qty 150

## 2014-11-27 MED ORDER — HYDRALAZINE HCL 20 MG/ML IJ SOLN: 5.0000 mg | INTRAMUSCULAR | Status: DC | PRN

## 2014-11-27 MED ORDER — HYDROCORTISONE 1 % EX CREA
1.0000 "application " | TOPICAL_CREAM | CUTANEOUS | Status: DC | PRN
  Filled 2014-11-27: qty 28

## 2014-11-27 MED ORDER — SODIUM CHLORIDE 0.9 % IV BOLUS (SEPSIS)
1000.0000 mL | INTRAVENOUS | Status: AC
  Administered 2014-11-27 (×2): 1000 mL via INTRAVENOUS

## 2014-11-27 MED ORDER — ASPIRIN 81 MG PO CHEW
81.0000 mg | CHEWABLE_TABLET | Freq: Two times a day (BID) | ORAL | Status: DC
  Administered 2014-11-27 – 2014-11-28 (×2): 81 mg via ORAL
  Filled 2014-11-27 (×4): qty 1

## 2014-11-27 MED ORDER — ALUM & MAG HYDROXIDE-SIMETH 200-200-20 MG/5ML PO SUSP: 30.0000 mL | Freq: Four times a day (QID) | ORAL | Status: DC | PRN

## 2014-11-27 MED ORDER — NAPROXEN SODIUM 220 MG PO TABS: 220.0000 mg | ORAL_TABLET | Freq: Two times a day (BID) | ORAL | Status: DC | PRN

## 2014-11-27 MED ORDER — SODIUM CHLORIDE 0.9 % IV SOLN
INTRAVENOUS | Status: DC
  Administered 2014-11-28: 04:00:00 via INTRAVENOUS

## 2014-11-27 MED ORDER — PRESERVISION AREDS PO CAPS: 1.0000 | ORAL_CAPSULE | Freq: Every morning | ORAL | Status: DC

## 2014-11-27 MED ORDER — CEFTRIAXONE SODIUM 2 G IJ SOLR: 2.0000 g | Freq: Once | INTRAMUSCULAR | Status: DC

## 2014-11-27 MED ORDER — ACETAMINOPHEN 325 MG PO TABS
650.0000 mg | ORAL_TABLET | Freq: Four times a day (QID) | ORAL | Status: DC | PRN
  Administered 2014-11-28: 650 mg via ORAL
  Filled 2014-11-27: qty 2

## 2014-11-27 NOTE — ED Provider Notes (Signed)
CSN: [id number]     Arrival date & time 11/27/14  1809 History   First MD Initiated Contact with Patient 11/27/14 1850     Chief Complaint  Patient presents with  . Fever  . Altered Mental Status     (Consider location/radiation/quality/duration/timing/severity/associated sxs/prior Treatment) HPI  81 year old male presents with confusion that started yesterday. The patient has been having a fever for the past 3 days. MAXIMUM TEMPERATURE 102. Patient has not had any symptoms including no cough, shortness of breath, or dysuria. No vomiting. Patient's confusion is mostly trouble speaking and slurred speech. Family also thinks he has been "out of it". The patient had a blood culture, labs, and urine and urine culture sent by PCP 2 days ago and so far the workup has been negative. Patient is usually independent with normal mental status.  Past Medical History  Diagnosis Date  . Hypertension   . Hypercholesteremia   . Impaired fasting glucose   . Prostate cancer radiation + seeding implant (Dr.[name])  . Rotator cuff tear, right 10/2010    supraspinatous and infraspinatous  . Diabetes mellitus   . Macular degeneration   . Heart murmur     mild aortic stenosis  . Radiation proctitis 11/2012    treated with APC ablation and Canasa suppositories (Dr. Hilarie Fredrickson)  . TIA (transient ischemic attack) 10/02/13    hosp at [facility] x 1 night  . 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 Aortic atherosclerosis 11/25/2014   Past Surgical History  Procedure Laterality Date  . Tonsillectomy and adenoidectomy  age 69  . Prostate seed implant    . Rotator cuff repair  06/16/2011    right (Dr. Berenice Primas)  . Colonoscopy N/A 11/28/2012    Procedure: COLONOSCOPY;  Surgeon: Jerene Bears, MD;  Location: [facility];  Service: Gastroenterology;  Laterality: N/A;   Family History  Problem Relation Age of Onset  . Heart disease Father 53    Died suddenly.  No diagnosis  . Stroke  Sister   . Dementia Mother   . Diabetes Mother   . Cancer Daughter     ovarian (?) vs other male cancer; s/p hyst doing well  . Diabetes Daughter     GDM, and now AODM  . Heart disease Son     congestive heart failure--improved   History  Substance Use Topics  . Smoking status: Former Smoker -- 1.00 packs/day for 20 years    Types: Cigarettes    Quit date: 05/31/1980  . Smokeless tobacco: Never Used  . Alcohol Use: 1.2 oz/week    2 Cans of beer per week     Comment: around 2 beers daily (occasionally a third) as max since hospitalizaton 5/15.    Review of Systems  Constitutional: Positive for fever.  Respiratory: Negative for cough and shortness of breath.   Gastrointestinal: Negative for vomiting.  Neurological: Negative for headaches.  Psychiatric/Behavioral: Positive for confusion.  All other systems reviewed and are negative.     Allergies  Review of patient's allergies indicates no known allergies.  Home Medications   Prior to Admission medications   Medication Sig Start Date End Date Taking? Authorizing Provider  Acetaminophen (TYLENOL ARTHRITIS PAIN PO) Take 2 tablets by mouth 2 (two) times daily.      Historical Provider, MD  amLODipine (NORVASC) 2.5 MG tablet TAKE 1 TABLET (2.5 MG TOTAL) BY MOUTH DAILY. 10/17/14   Rita Ohara, MD  aspirin 81 MG tablet Take 162 mg by  mouth daily.    Historical Provider, MD  lovastatin (MEVACOR) 20 MG tablet Take 2 tablets (40 mg total) by mouth at bedtime. 07/17/14   Rita Ohara, MD  metoprolol succinate (TOPROL-XL) 50 MG 24 hr tablet TAKE 1 TABLET BY MOUTH TWICE A DAY TAKE WITH OR IMMEDIATELY FOLLOWING A MEAL 10/03/14   Rita Ohara, MD  Multiple Vitamins-Minerals (EYE VITAMINS PO) Take 1 tablet by mouth daily. Patient states vitamin is actually called "pressure vision," supplement for macular degeneration.    Historical Provider, MD  OVER THE COUNTER MEDICATION Sleep aid, takes it most nights    Historical Provider, MD   BP 121/51 mmHg   Pulse 117  Temp(Src) 99.9 F (37.7 C) (Oral)  Resp 20  Ht 5\' 11"  (1.803 m)  Wt 172 lb (78.019 kg)  BMI 24.00 kg/m2  SpO2 95% Physical Exam  Constitutional: He is oriented to person, place, and time. He appears well-developed and well-nourished.  HENT:  Head: Normocephalic and atraumatic.  Right Ear: External ear normal.  Left Ear: External ear normal.  Nose: Nose normal.  Dry mucous membranes  Eyes: EOM are normal. Pupils are equal, round, and reactive to light. Right eye exhibits no discharge. Left eye exhibits no discharge.  Neck: Normal range of motion. Neck supple.  No meningismus  Cardiovascular: Normal rate, regular rhythm and intact distal pulses.   Murmur heard. Pulmonary/Chest: Effort normal and breath sounds normal.  Abdominal: Soft. There is no tenderness.  Musculoskeletal: He exhibits no edema.  Neurological: He is alert and oriented to person, place, and time.  CN 2-12 grossly intact. 5/5 strength in all 4 extremities. Alert to self and place, unable to tell me date or time  Skin: Skin is warm and dry.  Nursing note and vitals reviewed.   ED Course  Procedures (including critical care time) Labs Review Labs Reviewed  CBC WITH DIFFERENTIAL/PLATELET - Abnormal; Notable for the following:    RBC 3.93 (*)    HCT 37.8 (*)    Platelets 94 (*)    Neutrophils Relative % 86 (*)    Lymphocytes Relative 8 (*)    Lymphs Abs 0.6 (*)    All other components within normal limits  COMPREHENSIVE METABOLIC PANEL - Abnormal; Notable for the following:    Sodium 132 (*)    Chloride 97 (*)    Glucose, Bld 167 (*)    BUN 31 (*)    Calcium 8.2 (*)    Albumin 3.2 (*)    AST 84 (*)    All other components within normal limits  URINALYSIS, ROUTINE W REFLEX MICROSCOPIC (NOT AT [facility]) - Abnormal; Notable for the following:    Color, Urine AMBER (*)    APPearance CLOUDY (*)    Hgb urine dipstick MODERATE (*)    Bilirubin Urine SMALL (*)    Ketones, ur 15 (*)    Protein, ur  100 (*)    All other components within normal limits  URINE MICROSCOPIC-ADD ON - Abnormal; Notable for the following:    Squamous Epithelial / LPF FEW (*)    Bacteria, UA FEW (*)    Casts GRANULAR CAST (*)    All other components within normal limits  I-STAT CG4 LACTIC ACID, ED - Abnormal; Notable for the following:    Lactic Acid, Venous 2.18 (*)    All other components within normal limits  CULTURE, BLOOD (ROUTINE X 2)  CULTURE, BLOOD (ROUTINE X 2)  URINE CULTURE  PROTIME-INR    Imaging Review  Dg Chest Port 1 View  11/27/2014   CLINICAL DATA:  Fever for 4 days  EXAM: PORTABLE CHEST - 1 VIEW  COMPARISON:  11/25/2014  FINDINGS: Cardiac shadow is stable but accentuated by the portable technique. The lungs are well aerated without focal infiltrate or sizable effusion. Aortic atherosclerotic changes again noted without aneurysmal dilatation. No bony abnormality is seen.  IMPRESSION: No acute abnormality noted.  Aortic atherosclerosis.   Electronically Signed   By: Inez Catalina M.D.   On: 11/27/2014 19:02     EKG Interpretation None      MDM   Final diagnoses:  Fever, unspecified fever cause  Sepsis, due to unspecified organism    Patient's workup from 2 days ago by his PCP was reviewed. One blood culture was obtained and shows coagulase-negative staph that is likely contaminant, however will need coverage with vancomycin for now. Urine did not one specific species but does have greater than 100,000 multiple organisms. His urine is dark and has hematuria although no specific leukocytes or nitrites. This was not cathed specimen, I feel that most likely the urine is the source causing his symptoms. He is mildly confused but has normal neck range of motion, no headache, and I have low suspicion for meningitis. Initially treated with Rocephin but now hospital is requesting Zosyn and thank for broad coverage.    Sherwood Gambler, MD 11/27/14 2047

## 2014-11-27 NOTE — Telephone Encounter (Signed)
Patient's wife called back and she is concerned that patient is very weak, not getting any better-worsening actually. He is running temp of 99.9. Not drinking like he should, eating some. Wonders if he needs to go and get fluids at ER or okay to wait until Friday to see you?

## 2014-11-27 NOTE — ED Notes (Signed)
NOTIFIED DR.GOLDSTON FOR PATIENTS LAB RESULTS OF CG4+LACTIC ACID @19:00PM. 

## 2014-11-27 NOTE — Progress Notes (Signed)
ANTIBIOTIC CONSULT NOTE - INITIAL  Pharmacy Consult for Vancomycin Indication: rule out sepsis  No Known Allergies  Patient Measurements: Height: 5\' 11"  (180.3 cm) Weight: 172 lb (78.019 kg) IBW/kg (Calculated) : 75.3 Adjusted Body Weight:   Vital Signs: Temp: 102.1 F (38.9 C) (06/29 2045) Temp Source: Rectal (06/29 2045) BP: 131/70 mmHg (06/29 1915) Pulse Rate: 111 (06/29 1915) Intake/Output from previous day:   Intake/Output from this shift:    Labs:  Recent Labs  11/25/14 1600 11/27/14 1829  WBC 5.3 7.8  HGB 13.3 13.0  PLT 137* 94*  CREATININE  --  1.07   Estimated Creatinine Clearance: 58.6 mL/min (by C-G formula based on Cr of 1.07). No results for input(s): VANCOTROUGH, VANCOPEAK, VANCORANDOM, GENTTROUGH, GENTPEAK, GENTRANDOM, TOBRATROUGH, TOBRAPEAK, TOBRARND, AMIKACINPEAK, AMIKACINTROU, AMIKACIN in the last 72 hours.   Microbiology: Recent Results (from the past 720 hour(s))  Urine culture     Status: None   Collection Time: 11/25/14  3:33 PM  Result Value Ref Range Status   Colony Count >=100,000 COLONIES/ML  Final   Organism ID, Bacteria Multiple bacterial morphotypes present, none  Final   Organism ID, Bacteria predominant. Suggest appropriate recollection if   Final   Organism ID, Bacteria clinically indicated.  Final  Culture, blood (single)     Status: None   Collection Time: 11/25/14  4:00 PM  Result Value Ref Range Status   Culture STAPHYLOCOCCUS SPECIES (COAGULASE NEGATIVE)  Final   Organism ID, Bacteria STAPHYLOCOCCUS SPECIES (COAGULASE NEGATIVE)  Final    Comment: The significance of isolating this organism from a single venipuncture cannot be predicted without further clinical and culture correlation Susceptibilities available only on request. Gram Stain Report Called to,Read Back By and Verified With: [place] 11/26/14 1520 BY SMITHERSJ     Medical History: Past Medical History  Diagnosis Date  . Hypertension   .  Hypercholesteremia   . Impaired fasting glucose   . Prostate cancer radiation + seeding implant (Dr.[name])  . Rotator cuff tear, right 10/2010    supraspinatous and infraspinatous  . Diabetes mellitus   . Macular degeneration   . Heart murmur     mild aortic stenosis  . Radiation proctitis 11/2012    treated with APC ablation and Canasa suppositories (Dr. Hilarie Fredrickson)  . TIA (transient ischemic attack) 10/02/13    hosp at [facility] x 1 night  . 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 Aortic atherosclerosis 11/25/2014  . Acute encephalopathy 11/27/2014    Medications:   (Not in a hospital admission) Scheduled:  . vancomycin  750 mg Intravenous Q12H   Infusions:  . piperacillin-tazobactam (ZOSYN)  IV    . sodium chloride 1,000 mL (11/27/14 2045)   Followed by  . sodium chloride 500 mL (11/27/14 2038)   Assessment: 81yo male presents with fever and AMS. Pharmacy is consulted to dose vancomycin for suspected sepsis. Pt was originally started on ceftriaxone for what was thought to be a UTI. Will broaden coverage with initial dose of zosyn as well. Pt is febrile to 102.1, WBC wnl, sCr 1.1, LA 2.2.  Goal of Therapy:  Vancomycin trough level 15-20 mcg/ml  Plan:  Vancomycin 750mg  IV q12h Zosyn 3.375g IV once - would dose q8h if continued Measure antibiotic drug levels at steady state Follow up culture results, renal function, and clinical course  Andrey Cota. Diona Foley, PharmD Clinical Pharmacist Pager [phone] 11/27/2014,8:49 PM

## 2014-11-27 NOTE — Telephone Encounter (Signed)
It is reassuring that his temperatures aren't remaining high.  If his BP is <161 systolic, or if his pulse is >100, then he should go to the ER.  If his BP and pulse are okay, then work on continuing to hydrate orally.  I don't want him to wait until Friday, and come in here so incredibly weak that I end up sending him to the ER--if we can see that he is headed in that direction, weakness/lethargy is getting worse rather than better, then he shouldn't wait to go to ER.

## 2014-11-27 NOTE — H&P (Signed)
[facility] History and Physical  WREN GALLAGA ZOX:[id number] DOB: August 29, 1932 DOA: 11/27/2014  Referring physician: ED physician PCP: Vikki Ports, MD  Specialists:   Chief Complaint: fever and AMS  HPI: Jeffrey [name] is a 81 y.o. male with PMH of hypertension, hyperlipidemia, diet-controlled diabetes mellitus, prostate cancer (s/p of radiation therapy and seed implant), TIA, who presents with fever and altered mental status.  Patient has AMS and is unable to provide accurate history. Therefore, most of the history is obtained by discussing the case with the ED physician and family members (wife, daughter and granddaughter). Per family, patient has been having fever for the past 3 days with Tm of 102. He seems to be confused since yesterday. Patient has generalized weakness. He does not have cough, chest pain, SOB, abdominal pain, nausea, vomiting, diarrhea, rashes, headache, neck stiffness, unilateral weakness. No skin rashes. No symptoms of UTI. Patient was seen by his PCP and had  blood culture, urine and urine culture sent by PCP 2 days ago. His UA was negative and Ux showed multiple bacterial morphotypes. His blood culture was positive for coagulase-negative Staphylococcus spaces. Of note, patient had an insect bite mark over the left medial thigh 3 weeks ago when he was in [employer] [place]. The mark has disappeared now.   In ED, patient was found to have temperature 102.1, WBC 7.8, tachycardia, negative urinalysis (urinalysis positive for hgb), thrombocytopenia, electrolytes okay. Chest x-ray negative. CT head is negative for acute abnormalities. Patient is admitted to inpatient for further evaluation and treatment.  Where does patient live?   At home    Can patient participate in ADLs?  Some   Review of Systems:  Unable to assess due to AMS  Allergy: No Known Allergies  Past Medical History  Diagnosis Date  . Hypertension   . Hypercholesteremia   . Impaired fasting glucose   .  Prostate cancer radiation + seeding implant (Dr.[name])  . Rotator cuff tear, right 10/2010    supraspinatous and infraspinatous  . Diabetes mellitus   . Macular degeneration   . Heart murmur     mild aortic stenosis  . Radiation proctitis 11/2012    treated with APC ablation and Canasa suppositories (Dr. Hilarie Fredrickson)  . TIA (transient ischemic attack) 10/02/13    [facility]osp at [facility] x 1 night  . 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 Aortic atherosclerosis 11/25/2014  . Acute encephalopathy 11/27/2014    Past Surgical History  Procedure Laterality Date  . Tonsillectomy and adenoidectomy  age 79  . Prostate seed implant    . Rotator cuff repair  06/16/2011    right (Dr. Berenice Primas)  . Colonoscopy N/A 11/28/2012    Procedure: COLONOSCOPY;  Surgeon: Jerene Bears, MD;  Location: [facility];  Service: Gastroenterology;  Laterality: N/A;    Social History:  reports that he quit smoking about 34 years ago. His smoking use included Cigarettes. He has a 20 pack-year smoking history. He has never used smokeless tobacco. He reports that he drinks about 1.2 oz of alcohol per week. He reports that he does not use illicit drugs.  Family History:  Family History  Problem Relation Age of Onset  . Heart disease Father 56    Died suddenly.  No diagnosis  . Stroke Sister   . Dementia Mother   . Diabetes Mother   . Cancer Daughter     ovarian (?) vs other male cancer; s/p hyst doing well  . Diabetes  Daughter     GDM, and now AODM  . Heart disease Son     congestive heart failure--improved     Prior to Admission medications   Medication Sig Start Date End Date Taking? Authorizing Provider  Acetaminophen (TYLENOL ARTHRITIS PAIN PO) Take 2 tablets by mouth 2 (two) times daily.     Yes Historical Provider, MD  amLODipine (NORVASC) 2.5 MG tablet TAKE 1 TABLET (2.5 MG TOTAL) BY MOUTH DAILY. 10/17/14  Yes Rita Ohara, MD  aspirin 81 MG tablet Take 81 mg by mouth 2 (two) times  daily.    Yes Historical Provider, MD  doxylamine, Sleep, (EQ SLEEP AID) 25 MG tablet Take 25 mg by mouth at bedtime as needed for sleep.   Yes Historical Provider, MD  hydrocortisone cream 1 % Apply 1 application topically as needed (for ankles).   Yes Historical Provider, MD  lovastatin (MEVACOR) 20 MG tablet Take 2 tablets (40 mg total) by mouth at bedtime. 07/17/14  Yes Rita Ohara, MD  metoprolol succinate (TOPROL-XL) 50 MG 24 hr tablet TAKE 1 TABLET BY MOUTH TWICE A DAY TAKE WITH OR IMMEDIATELY FOLLOWING A MEAL 10/03/14  Yes Rita Ohara, MD  Multiple Vitamins-Minerals (PRESERVISION AREDS) CAPS Take 1 capsule by mouth every morning.   Yes Historical Provider, MD  naproxen sodium (ANAPROX) 220 MG tablet Take 220 mg by mouth 2 (two) times daily as needed (for pain).   Yes Historical Provider, MD    Physical Exam: Filed Vitals:   11/27/14 1816 11/27/14 1915 11/27/14 2045  BP: 121/51 131/70   Pulse: 117 111   Temp: 99.9 F (37.7 C)  102.1 F (38.9 C)  TempSrc: Oral  Rectal  Resp: 20 38   Height: 5\' 11"  (1.803 m)    Weight: 78.019 kg (172 lb)    SpO2: 95% 94%    General: Not in acute distress HEENT:       Eyes: PERRL, EOMI, no scleral icterus.       ENT: No discharge from the ears and nose, no pharynx injection, no tonsillar enlargement.        Neck: No JVD, no bruit, no mass felt. Heme: No neck lymph node enlargement. Cardiac: S1/S2, RRR, tachycardia, 3/6 systolic murmurs, No gallops or rubs. Pulm: Good air movement bilaterally. No rales, wheezing, rhonchi or rubs. Abd: Soft, nondistended, nontender, no rebound pain, no organomegaly, BS present. Ext: No pitting leg edema bilaterally. 2+DP/PT pulse bilaterally. Musculoskeletal: No joint deformities, No joint redness or warmth, no limitation of ROM in spin. Skin: No rashes.  Neuro: Alert, oriented to person and place, but not time, cranial nerves II-XII grossly intact, muscle strength 5/5 in all extremities, sensation to light touch  intact. Brachial reflex 2+ bilaterally. Knee reflex 1+ bilaterally. Negative Babinski's sign. Negative Brudzinski and Kernig sign. Psych: Patient is not psychotic. Labs on Admission:  Basic Metabolic Panel:  Recent Labs Lab 11/27/14 1829  NA 132*  K 4.1  CL 97*  CO2 22  GLUCOSE 167*  BUN 31*  CREATININE 1.07  CALCIUM 8.2*   Liver Function Tests:  Recent Labs Lab 11/27/14 1829  AST 84*  ALT 48  ALKPHOS 53  BILITOT 1.0  PROT 6.7  ALBUMIN 3.2*   No results for input(s): LIPASE, AMYLASE in the last 168 hours. No results for input(s): AMMONIA in the last 168 hours. CBC:  Recent Labs Lab 11/25/14 1600 11/27/14 1829  WBC 5.3 7.8  NEUTROABS 4.8 6.7  HGB 13.3 13.0  HCT 39.8 37.8*  MCV 95.9 96.2  PLT 137* 94*   Cardiac Enzymes: No results for input(s): CKTOTAL, CKMB, CKMBINDEX, TROPONINI in the last 168 hours.  BNP (last 3 results) No results for input(s): BNP in the last 8760 hours.  ProBNP (last 3 results) No results for input(s): PROBNP in the last 8760 hours.  CBG: No results for input(s): GLUCAP in the last 168 hours.  Radiological Exams on Admission: Ct Head Wo Contrast  11/27/2014   CLINICAL DATA:  81 year old man with confusion, weakness and fever  EXAM: CT HEAD WITHOUT CONTRAST  TECHNIQUE: Contiguous axial images were obtained from the base of the skull through the vertex without intravenous contrast.  COMPARISON:  Prior head CT 10/01/2013; prior brain MRI 10/02/2013  FINDINGS: Mild image degradation secondary to motion related artifact. Negative for acute intracranial hemorrhage, acute infarction, mass, mass effect, hydrocephalus or midline shift. Gray-white differentiation is preserved throughout. Stable appearance of a moderately severe periventricular, subcortical and deep white matter hypoattenuation most consistent with chronic microvascular ischemic white matter disease. Cortical cerebral atrophy is also unchanged. Mild ex vacuo ventriculomegaly. No  focal soft tissue or calvarial abnormality. Unremarkable globes and orbits. Normal aeration mastoid air cells and paranasal sinuses.  IMPRESSION: No acute intracranial abnormality.  Stable pattern of cerebral cortical atrophy and moderately advanced chronic microvascular ischemic white matter disease.   Electronically Signed   By: Jacqulynn Cadet M.D.   On: 11/27/2014 19:37   Dg Chest Port 1 View  11/27/2014   CLINICAL DATA:  Fever for 4 days  EXAM: PORTABLE CHEST - 1 VIEW  COMPARISON:  11/25/2014  FINDINGS: Cardiac shadow is stable but accentuated by the portable technique. The lungs are well aerated without focal infiltrate or sizable effusion. Aortic atherosclerotic changes again noted without aneurysmal dilatation. No bony abnormality is seen.  IMPRESSION: No acute abnormality noted.  Aortic atherosclerosis.   Electronically Signed   By: Inez Catalina M.D.   On: 11/27/2014 19:02    EKG: Not done in ED, will get one.   Assessment/Plan Principal Problem:   Fever Active Problems:   Essential hypertension, benign   Pure hypercholesterolemia   Prostate cancer   Type 2 diabetes mellitus without complication   Murmur   TIA (transient ischemic attack)   Aortic atherosclerosis   Sepsis   Acute encephalopathy   Fever and sepsis: Pt presents with fever with unknown original for infection. He is septic with tachycardia, fever and elevated lactate. Currently hemodynamically stable. He had positive blood culture for coagulase negative staphylocococcus on 6/27,  which is likely due to contamination. Negative urinalysis today. Patient had insect bite marker noted 3 weeks ago, need to r/o RMSF (no rashes) and lyme disease. Patient does not have meningeal signs, less likely to have meningitis. Another differential diagnosis is endocarditis. Patient has murmur over aortic area, but it is chronic due to AS, no signs of acute CHF.  -will admit to tele bed -start Abx with broad coverage until further work  up completed: IV vancomycin, Zosyn and doxycycline -follow up blood and urine culture -get B Burgdorferi Ab and RMSF IgM and IgG -will get Procalcitonin and trend lactic acid levels per sepsis protocol. -IVF: 2.5L of NS bolus, followed by 125 cc/h -May consider TEE to r/o EC, if fever is persistent after started abx with broad coverage.  Acute encephalopathy: CT head negative for acute abnormalities. Most likely due to sepsis. Since patient has thrombocytopenia, need to rule out TTP. -neuro check q4h -get peripheral blood smear, LDH and  heptoglobin  Thrombocytopenia: Most likely due to sepsis, but need to rule out TTP as above. No bleeding tendency currently. -follow up by cbc  Essential hypertension: -hold amlodipine, since patient is at risk of developing hypotension from sepsis - continue metoprolol -IV hydralazine when necessary  HLD: Last LDL was 79 on 04/10/14 -Switch lovastatin to Pravastatin  Diet controlled diabetes: A1c was 5.8 on 10/17/14. Blood sugar 167 on admission. -cbg qAM, will start SSI if blood sugar is significantly elevated.  Hx of TIA: -ASA  DVT ppx: SCD   Code Status: Full code Family Communication:   Yes, patient's  family     at bed side Disposition Plan: Admit to inpatient   Date of Service 11/27/2014    Ivor Costa [facility] Pager [phone]  If 7PM-7AM, please contact night-coverage [email or web address] Password [facility] 11/27/2014, 8:46 PM

## 2014-11-27 NOTE — ED Notes (Signed)
Code Sepsis Level 2 called @1923 .

## 2014-11-27 NOTE — Telephone Encounter (Signed)
Patient pulse is 110, becoming confused and incontinent. Advised to go to ER.  Also, DrKnapp did get on phone and tell them the same.

## 2014-11-27 NOTE — ED Notes (Addendum)
Pt being seen today for fever, confusion. Was seen 2 days ago for fever his PCP. sts some tachypnea. Pt weak all over. Pt disoriented to person, place and time. Per granddaughter pt normally independent.

## 2014-11-27 NOTE — Progress Notes (Signed)
Pt admitted to the unit with family. Pt is stable, alert and oriented per baseline. Oriented to room, staff, and call bell. Educated to call for any assistance. Bed in lowest position, call bell within reach- will continue to monitor. 

## 2014-11-27 NOTE — Progress Notes (Signed)
Patient received to room 5W27 from ED @ 2130, transferred to bed, SR up, call bell in reach. IVF infusing to LFA and RFA without problems, sites clear. Tele box #6 applied and monitor office notified of patient on tele. Patient assessed, admission paperwork/assessment complete with assist of patients wife, patient denies pain or problems. Patient/family oriented to room, call bell, bed controls, bed alarm, patient guide book, fall safety and all orders with verbal understanding. No problems noted or voiced at this time, will monitor.

## 2014-11-28 ENCOUNTER — Inpatient Hospital Stay (HOSPITAL_COMMUNITY): Payer: Medicare Other

## 2014-11-28 ENCOUNTER — Other Ambulatory Visit: Payer: Self-pay | Admitting: Family Medicine

## 2014-11-28 DIAGNOSIS — R0603 Acute respiratory distress: Secondary | ICD-10-CM | POA: Insufficient documentation

## 2014-11-28 DIAGNOSIS — E871 Hypo-osmolality and hyponatremia: Secondary | ICD-10-CM

## 2014-11-28 DIAGNOSIS — A419 Sepsis, unspecified organism: Secondary | ICD-10-CM | POA: Insufficient documentation

## 2014-11-28 DIAGNOSIS — R531 Weakness: Secondary | ICD-10-CM

## 2014-11-28 DIAGNOSIS — N179 Acute kidney failure, unspecified: Secondary | ICD-10-CM | POA: Insufficient documentation

## 2014-11-28 DIAGNOSIS — R410 Disorientation, unspecified: Secondary | ICD-10-CM

## 2014-11-28 DIAGNOSIS — R6520 Severe sepsis without septic shock: Secondary | ICD-10-CM

## 2014-11-28 LAB — COMPREHENSIVE METABOLIC PANEL
ALK PHOS: 44 U/L (ref 38–126)
ALT: 31 U/L (ref 17–63)
ALT: 40 U/L (ref 17–63)
AST: 64 U/L — ABNORMAL HIGH (ref 15–41)
AST: 56 U/L — ABNORMAL HIGH (ref 15–41)
Albumin: 2.6 g/dL — ABNORMAL LOW (ref 3.5–5.0)
Albumin: 2.3 g/dL — ABNORMAL LOW (ref 3.5–5.0)
Alkaline Phosphatase: 49 U/L (ref 38–126)
Anion gap: 11 (ref 5–15)
Anion gap: 9 (ref 5–15)
BILIRUBIN TOTAL: 1.1 mg/dL (ref 0.3–1.2)
BUN: 26 mg/dL — ABNORMAL HIGH (ref 6–20)
BUN: 24 mg/dL — ABNORMAL HIGH (ref 6–20)
CALCIUM: 7.7 mg/dL — AB (ref 8.9–10.3)
CHLORIDE: 104 mmol/L (ref 101–111)
CHLORIDE: 97 mmol/L — AB (ref 101–111)
CO2: 24 mmol/L (ref 22–32)
CO2: 22 mmol/L (ref 22–32)
Calcium: 7.6 mg/dL — ABNORMAL LOW (ref 8.9–10.3)
Creatinine, Ser: 1.05 mg/dL (ref 0.61–1.24)
Creatinine, Ser: 1.06 mg/dL (ref 0.61–1.24)
GFR calc Af Amer: >60 mL/min (ref >60)
GFR calc non Af Amer: >60 mL/min (ref >60)
GLUCOSE: 164 mg/dL — AB (ref 65–99)
Glucose, Bld: 180 mg/dL — ABNORMAL HIGH (ref 65–99)
POTASSIUM: 4.1 mmol/L (ref 3.5–5.1)
POTASSIUM: 3.6 mmol/L (ref 3.5–5.1)
Sodium: 132 mmol/L — ABNORMAL LOW (ref 135–145)
Sodium: 135 mmol/L (ref 135–145)
TOTAL PROTEIN: 5.5 g/dL — AB (ref 6.5–8.1)
TOTAL PROTEIN: 5.6 g/dL — AB (ref 6.5–8.1)
Total Bilirubin: 0.7 mg/dL (ref 0.3–1.2)

## 2014-11-28 LAB — BLOOD GAS, ARTERIAL
Acid-base deficit: 3.2 mmol/L — ABNORMAL HIGH (ref 0.0–2.0)
Bicarbonate: 19.7 mEq/L — ABNORMAL LOW (ref 20.0–24.0)
Drawn by: 44135
FIO2: 100 %
MECHVT: 600 mL
O2 SAT: 99.5 %
PEEP: 5 cmH2O
PH ART: 7.453 — AB (ref 7.350–7.450)
PO2 ART: 469 mmHg — AB (ref 80.0–100.0)
Patient temperature: 102.4
RATE: 20 resp/min
TCO2: 20.5 mmol/L (ref 0–100)
pCO2 arterial: 29.3 mmHg — ABNORMAL LOW (ref 35.0–45.0)

## 2014-11-28 LAB — CSF CELL COUNT WITH DIFFERENTIAL
EOS CSF: NONE SEEN % (ref 0–1)
Eosinophils, CSF: NONE SEEN % (ref 0–1)
RBC Count, CSF: 0 /mm3
RBC Count, CSF: 1 /mm3 — ABNORMAL HIGH
TUBE #: 1
Tube #: 4
WBC, CSF: 2 /mm3 (ref 0–5)
WBC, CSF: 3 /mm3 (ref 0–5)

## 2014-11-28 LAB — CBC
HCT: 33.1 % — ABNORMAL LOW (ref 39.0–52.0)
HCT: 31.5 % — ABNORMAL LOW (ref 39.0–52.0)
HEMOGLOBIN: 10.7 g/dL — AB (ref 13.0–17.0)
HEMOGLOBIN: 11.1 g/dL — AB (ref 13.0–17.0)
MCH: 32.2 pg (ref 26.0–34.0)
MCH: 32.3 pg (ref 26.0–34.0)
MCHC: 33.5 g/dL (ref 30.0–36.0)
MCHC: 34 g/dL (ref 30.0–36.0)
MCV: 95.9 fL (ref 78.0–100.0)
MCV: 95.2 fL (ref 78.0–100.0)
Platelets: 83 10*3/uL — ABNORMAL LOW (ref 150–400)
Platelets: 82 10*3/uL — ABNORMAL LOW (ref 150–400)
RBC: 3.31 MIL/uL — ABNORMAL LOW (ref 4.22–5.81)
RBC: 3.45 MIL/uL — AB (ref 4.22–5.81)
RDW: 13.3 % (ref 11.5–15.5)
RDW: 13.5 % (ref 11.5–15.5)
WBC: 7.5 10*3/uL (ref 4.0–10.5)
WBC: 8.9 10*3/uL (ref 4.0–10.5)

## 2014-11-28 LAB — GLUCOSE, CAPILLARY
GLUCOSE-CAPILLARY: 161 mg/dL — AB (ref 65–99)
Glucose-Capillary: 145 mg/dL — ABNORMAL HIGH (ref 65–99)
Glucose-Capillary: 131 mg/dL — ABNORMAL HIGH (ref 65–99)
Glucose-Capillary: 132 mg/dL — ABNORMAL HIGH (ref 65–99)

## 2014-11-28 LAB — AMMONIA: Ammonia: 13 umol/L (ref 9–35)

## 2014-11-28 LAB — MRSA PCR SCREENING: MRSA BY PCR: NEGATIVE

## 2014-11-28 LAB — PROTEIN AND GLUCOSE, CSF
Glucose, CSF: 70 mg/dL (ref 40–70)
Total  Protein, CSF: 111 mg/dL — ABNORMAL HIGH (ref 15–45)

## 2014-11-28 LAB — TECHNOLOGIST SMEAR REVIEW: Tech Review: INCREASED

## 2014-11-28 LAB — TROPONIN I
TROPONIN I: 0.05 ng/mL — AB (ref <0.031)
TROPONIN I: 0.1 ng/mL — AB (ref <0.031)

## 2014-11-28 LAB — LACTIC ACID, PLASMA: Lactic Acid, Venous: 1.7 mmol/L (ref 0.5–2.0)

## 2014-11-28 MED ORDER — FENTANYL CITRATE (PF) 100 MCG/2ML IJ SOLN
INTRAMUSCULAR | Status: AC
  Filled 2014-11-28: qty 4

## 2014-11-28 MED ORDER — FENTANYL CITRATE (PF) 100 MCG/2ML IJ SOLN
100.0000 ug | Freq: Once | INTRAMUSCULAR | Status: AC
  Administered 2014-11-28: 100 ug via INTRAVENOUS

## 2014-11-28 MED ORDER — METOPROLOL TARTRATE 1 MG/ML IV SOLN: 2.5000 mg | INTRAVENOUS | Status: DC | PRN

## 2014-11-28 MED ORDER — ETOMIDATE 2 MG/ML IV SOLN
0.3000 mg/kg | Freq: Once | INTRAVENOUS | Status: AC
Start: 2014-11-28 — End: 2014-11-28
  Administered 2014-11-28: 20 mg via INTRAVENOUS

## 2014-11-28 MED ORDER — MIDAZOLAM HCL 2 MG/2ML IJ SOLN
2.0000 mg | Freq: Once | INTRAMUSCULAR | Status: AC
  Administered 2014-11-28: 2 mg via INTRAVENOUS

## 2014-11-28 MED ORDER — POTASSIUM CHLORIDE CRYS ER 20 MEQ PO TBCR
40.0000 meq | EXTENDED_RELEASE_TABLET | Freq: Once | ORAL | Status: AC
  Administered 2014-11-28: 40 meq via ORAL
  Filled 2014-11-28: qty 2

## 2014-11-28 MED ORDER — LORAZEPAM 2 MG/ML IJ SOLN
1.0000 mg | Freq: Once | INTRAMUSCULAR | Status: AC
  Administered 2014-11-28: 1 mg via INTRAVENOUS
  Filled 2014-11-28: qty 1

## 2014-11-28 MED ORDER — HYDRALAZINE HCL 20 MG/ML IJ SOLN: 10.0000 mg | INTRAMUSCULAR | Status: DC | PRN

## 2014-11-28 MED ORDER — DEXTROSE IN LACTATED RINGERS 5 % IV SOLN: INTRAVENOUS | Status: DC

## 2014-11-28 MED ORDER — AMPICILLIN-SULBACTAM SODIUM 1.5 (1-0.5) G IJ SOLR
1.5000 g | Freq: Four times a day (QID) | INTRAMUSCULAR | Status: DC
  Administered 2014-11-28 – 2014-11-29 (×3): 1.5 g via INTRAVENOUS
  Filled 2014-11-28 (×5): qty 1.5

## 2014-11-28 MED ORDER — ROCURONIUM BROMIDE 50 MG/5ML IV SOLN
1.0000 mg/kg | Freq: Once | INTRAVENOUS | Status: AC
  Administered 2014-11-28: 100 mg via INTRAVENOUS

## 2014-11-28 MED ORDER — FENTANYL CITRATE (PF) 100 MCG/2ML IJ SOLN
25.0000 ug | INTRAMUSCULAR | Status: DC | PRN
  Administered 2014-11-28 – 2014-11-29 (×2): 100 ug via INTRAVENOUS
  Filled 2014-11-28: qty 2

## 2014-11-28 MED ORDER — ASPIRIN 81 MG PO CHEW
81.0000 mg | CHEWABLE_TABLET | Freq: Every day | ORAL | Status: DC
  Administered 2014-11-30: 81 mg
  Filled 2014-11-28: qty 1

## 2014-11-28 MED ORDER — LACTATED RINGERS IV SOLN
INTRAVENOUS | Status: DC
  Administered 2014-11-28: 21:00:00 via INTRAVENOUS

## 2014-11-28 MED ORDER — PERFLUTREN LIPID MICROSPHERE
1.0000 mL | INTRAVENOUS | Status: AC | PRN
  Administered 2014-11-28: 2 mL via INTRAVENOUS
  Filled 2014-11-28: qty 10

## 2014-11-28 MED ORDER — FAMOTIDINE IN NACL 20-0.9 MG/50ML-% IV SOLN
20.0000 mg | Freq: Two times a day (BID) | INTRAVENOUS | Status: DC
  Administered 2014-11-28 – 2014-11-30 (×4): 20 mg via INTRAVENOUS
  Filled 2014-11-28 (×5): qty 50

## 2014-11-28 MED ORDER — SODIUM CHLORIDE 0.9 % IV BOLUS (SEPSIS)
500.0000 mL | INTRAVENOUS | Status: DC | PRN
  Administered 2014-11-28 – 2014-11-29 (×3): 500 mL via INTRAVENOUS
  Filled 2014-11-28 (×3): qty 500

## 2014-11-28 MED ORDER — MIDAZOLAM HCL 2 MG/2ML IJ SOLN
INTRAMUSCULAR | Status: AC
  Filled 2014-11-28: qty 4

## 2014-11-28 MED ORDER — INSULIN ASPART 100 UNIT/ML ~~LOC~~ SOLN
0.0000 [IU] | SUBCUTANEOUS | Status: DC
  Administered 2014-11-28: 2 [IU] via SUBCUTANEOUS
  Administered 2014-11-28: 3 [IU] via SUBCUTANEOUS
  Administered 2014-11-29 – 2014-11-30 (×2): 2 [IU] via SUBCUTANEOUS
  Administered 2014-11-30: 3 [IU] via SUBCUTANEOUS

## 2014-11-28 MED ORDER — ACETAMINOPHEN 160 MG/5ML PO SOLN
650.0000 mg | Freq: Four times a day (QID) | ORAL | Status: DC | PRN
  Administered 2014-11-28: 650 mg
  Filled 2014-11-28: qty 20.3

## 2014-11-28 MED ORDER — VANCOMYCIN HCL IN DEXTROSE 750-5 MG/150ML-% IV SOLN
750.0000 mg | Freq: Two times a day (BID) | INTRAVENOUS | Status: DC
  Administered 2014-11-28 – 2014-11-30 (×5): 750 mg via INTRAVENOUS
  Filled 2014-11-28 (×7): qty 150

## 2014-11-28 MED ORDER — CHLORHEXIDINE GLUCONATE 0.12 % MT SOLN
15.0000 mL | Freq: Two times a day (BID) | OROMUCOSAL | Status: DC
  Administered 2014-11-28 – 2014-12-03 (×9): 15 mL via OROMUCOSAL
  Filled 2014-11-28 (×13): qty 15

## 2014-11-28 MED ORDER — DEXMEDETOMIDINE HCL IN NACL 400 MCG/100ML IV SOLN
0.4000 ug/kg/h | INTRAVENOUS | Status: DC
  Administered 2014-11-28 – 2014-11-29 (×2): 0.4 ug/kg/h via INTRAVENOUS
  Filled 2014-11-28 (×2): qty 100

## 2014-11-28 MED ORDER — PRAVASTATIN SODIUM 40 MG PO TABS
40.0000 mg | ORAL_TABLET | Freq: Every day | ORAL | Status: DC
  Filled 2014-11-28 (×2): qty 1

## 2014-11-28 MED ORDER — CETYLPYRIDINIUM CHLORIDE 0.05 % MT LIQD
7.0000 mL | Freq: Four times a day (QID) | OROMUCOSAL | Status: DC
  Administered 2014-11-29 – 2014-12-03 (×14): 7 mL via OROMUCOSAL

## 2014-11-28 MED ORDER — ONDANSETRON HCL 4 MG/2ML IJ SOLN: 4.0000 mg | INTRAMUSCULAR | Status: DC | PRN

## 2014-11-28 MED ORDER — STROKE: EARLY STAGES OF RECOVERY BOOK
Freq: Once | Status: AC
  Administered 2014-11-28: 22:00:00
  Filled 2014-11-28 (×2): qty 1

## 2014-11-28 MED ORDER — POTASSIUM CHLORIDE 10 MEQ/100ML IV SOLN: 10.0000 meq | INTRAVENOUS | Status: DC

## 2014-11-28 NOTE — Care Management Note (Signed)
Case Management Note  Patient Details  Name: JACKSON COFFIELD MRN: [id number] Date of Birth: [birth year]  Subjective/Objective:        Patient lives with spouse, patient for mri also has had a cva per RN , NCM will cont to follow for dc needs.            Action/Plan:   Expected Discharge Date:                  Expected Discharge Plan:  [place]  In-House Referral:     Discharge planning Services  CM Consult  Post Acute Care Choice:    Choice offered to:     DME Arranged:    DME Agency:     HH Arranged:    [place] Agency:     Status of Service:  In process, will continue to follow  Medicare Important Message Given:    Date Medicare IM Given:    Medicare IM give by:    Date Additional Medicare IM Given:    Additional Medicare Important Message give by:     If discussed at [place] of Stay Meetings, dates discussed:    Additional Comments:  Zenon Mayo, RN 11/28/2014, 4:22 PM

## 2014-11-28 NOTE — Procedures (Signed)
LP Procedure Note:  Patient has been seen and examined.  Chart has been reviewed.  LP is being performed due  to fever and altered mental status.  Procedure has been explained to patient/family including risks and benefits.  Consent has been signed by family and witnessed.   Blood pressure 137/75, pulse 102, temperature 98.8 F (37.1 C), temperature source Oral, resp. rate 18, height 5\' 11"  (1.803 m), weight 78.019 kg (172 lb), SpO2 93 %.   Current facility-administered medications:  .  0.9 %  sodium chloride infusion, , Intravenous, Continuous, Ivor Costa, MD, Last Rate: 125 mL/hr at 11/28/14 0420 .  acetaminophen (TYLENOL) tablet 650 mg, 650 mg, Oral, Q6H PRN, Ivor Costa, MD, 650 mg at 11/28/14 0641 .  alum & mag hydroxide-simeth (MAALOX/MYLANTA) 200-200-20 MG/5ML suspension 30 mL, 30 mL, Oral, Q6H PRN, Ivor Costa, MD .  aspirin chewable tablet 81 mg, 81 mg, Oral, BID, Ivor Costa, MD, 81 mg at 11/28/14 1005 .  doxycycline (VIBRAMYCIN) 200 mg in dextrose 5 % 250 mL IVPB, 200 mg, Intravenous, Q12H, Ivor Costa, MD, 200 mg at 11/28/14 1138 .  doxylamine (Sleep) (UNISOM) tablet 25 mg, 25 mg, Oral, QHS PRN, Ivor Costa, MD .  hydrALAZINE (APRESOLINE) injection 5 mg, 5 mg, Intravenous, Q2H PRN, Ivor Costa, MD .  hydrocortisone cream 1 % 1 application, 1 application, Topical, PRN, Ivor Costa, MD .  LORazepam (ATIVAN) injection 1 mg, 1 mg, Intravenous, Once, Jessica U Vann, DO .  metoprolol succinate (TOPROL-XL) 24 hr tablet 50 mg, 50 mg, Oral, BID, Ivor Costa, MD, 50 mg at 11/28/14 1005 .  multivitamin (PROSIGHT) tablet 1 tablet, 1 tablet, Oral, Daily, Ivor Costa, MD, 1 tablet at 11/28/14 1007 .  naproxen (NAPROSYN) tablet 250 mg, 250 mg, Oral, BID PRN, Ivor Costa, MD .  nicotine (NICODERM CQ - dosed in mg/24 hours) patch 21 mg, 21 mg, Transdermal, Daily, Ivor Costa, MD, 21 mg at 11/28/14 1000 .  ondansetron (ZOFRAN) tablet 4 mg, 4 mg, Oral, Q6H PRN **OR** ondansetron (ZOFRAN) injection 4 mg, 4 mg, Intravenous,  Q6H PRN, Ivor Costa, MD .  pravastatin (PRAVACHOL) tablet 40 mg, 40 mg, Oral, q1800, Ivor Costa, MD .  sodium chloride 0.9 % injection 3 mL, 3 mL, Intravenous, Q12H, Ivor Costa, MD, 3 mL at 11/27/14 2310   Recent Labs  11/27/14 1829 11/27/14 2000 11/28/14 0020  WBC 7.8  --  7.5  HGB 13.0  --  11.1*  HCT 37.8*  --  33.1*  PLT 94*  --  83*  INR  --  1.03  --     CT of head:  Patient was placed in the lateral decub/sitting position.  Area was cleaned with betadine and anesthetized with lidocaine.  Under sterile conditions 20G LP needle was placed at approximately L3-4 without difficulty.  Opening pressure was not obtained.  Approximately 15cc of clear fluid was obtained and sent for studies.  No complications were noted.    Jim Like, DO [facility] [phone]  If 7pm- 7am, please page neurology on call as listed in [place]. @TD 

## 2014-11-28 NOTE — Progress Notes (Signed)
PROGRESS NOTE  Jeffrey [name] ZOX:[id number] DOB: 1932-08-28 DOA: 11/27/2014 PCP: Vikki Ports, MD  Assessment/Plan: Fever and sepsis: Pt presents with fever with unknown original for infection. He is septic with tachycardia, fever and elevated lactate. Currently hemodynamically stable. He had positive blood culture for coagulase negative staphylocococcus on 6/27, which is likely due to contamination. Negative urinalysis today. Patient had insect bite marker noted 3 weeks ago, need to r/o RMSF (no rashes) and lyme disease. Patient does not have meningeal signs, less likely to have meningitis. Another differential diagnosis is endocarditis. Patient has murmur over aortic area, but it is chronic due to AS, no signs of acute CHF. -IV vancomycin, Zosyn and doxycycline -follow up blood and urine culture -get B Burgdorferi Ab and RMSF IgM and IgG - Procalcitonin elevated -IVF: 2.5L of NS bolus, followed by 125 cc/h - ID consult: will need LP  Concern for CVA with trouble walking and speech difficulty -CT ok -MRI ordered  Acute encephalopathy: CT head negative for acute abnormalities. Most likely due to sepsis. Since patient has thrombocytopenia, need to rule out TTP. -neuro check q4h -peripheral blood smear showsMILD LEFT SHIFT (1-5% METAS, OCC MYELO, OCC BANDS)  DOHLE BODIES  LDH elevated and haptoglobin pending  Thrombocytopenia: Most likely due to sepsis, but need to rule out TTP as above. No bleeding tendency currently. -follow up by cbc  Essential hypertension: -hold amlodipine, since patient is at risk of developing hypotension from sepsis - continue metoprolol -IV hydralazine when necessary  HLD: Last LDL was 79 on 04/10/14 -Switch lovastatin to Pravastatin   Code Status: full Family Communication: patient/family at bedside Disposition Plan:    Consultants:  ID  neuro  Procedures:   HPI/Subjective: Feeling better Still having speech difficulty  Objective: Filed  Vitals:   11/28/14 1045  BP:   Pulse:   Temp: 97.4 F (36.3 C)  Resp:     Intake/Output Summary (Last 24 hours) at 11/28/14 1046 Last data filed at 11/28/14 0939  Gross per 24 hour  Intake   1628 ml  Output    450 ml  Net   1178 ml   Filed Weights   11/27/14 1816  Weight: 78.019 kg (172 lb)    Exam:   General:  speech slow   Cardiovascular: rrr, + murmur  Respiratory: clear  Abdomen: +Bs, soft  Musculoskeletal: no edema   Data Reviewed: Basic Metabolic Panel:  Recent Labs Lab 11/27/14 1829 11/28/14 0020  NA 132* 132*  K 4.1 3.6  CL 97* 97*  CO2 22 24  GLUCOSE 167* 180*  BUN 31* 26*  CREATININE 1.07 1.05  CALCIUM 8.2* 7.6*   Liver Function Tests:  Recent Labs Lab 11/27/14 1829 11/28/14 0020  AST 84* 64*  ALT 48 40  ALKPHOS 53 49  BILITOT 1.0 0.7  PROT 6.7 5.6*  ALBUMIN 3.2* 2.6*   No results for input(s): LIPASE, AMYLASE in the last 168 hours. No results for input(s): AMMONIA in the last 168 hours. CBC:  Recent Labs Lab 11/25/14 1600 11/27/14 1829 11/28/14 0020  WBC 5.3 7.8 7.5  NEUTROABS 4.8 6.7  --   HGB 13.3 13.0 11.1*  HCT 39.8 37.8* 33.1*  MCV 95.9 96.2 95.9  PLT 137* 94* 83*   Cardiac Enzymes: No results for input(s): CKTOTAL, CKMB, CKMBINDEX, TROPONINI in the last 168 hours. BNP (last 3 results) No results for input(s): BNP in the last 8760 hours.  ProBNP (last 3 results) No results for input(s): PROBNP in the last  8760 hours.  CBG:  Recent Labs Lab 11/28/14 0744  GLUCAP 145*    Recent Results (from the past 240 hour(s))  Urine culture     Status: None   Collection Time: 11/25/14  3:33 PM  Result Value Ref Range Status   Colony Count >=100,000 COLONIES/ML  Final   Organism ID, Bacteria Multiple bacterial morphotypes present, none  Final   Organism ID, Bacteria predominant. Suggest appropriate recollection if   Final   Organism ID, Bacteria clinically indicated.  Final  Culture, blood (single)     Status: None    Collection Time: 11/25/14  4:00 PM  Result Value Ref Range Status   Culture STAPHYLOCOCCUS SPECIES (COAGULASE NEGATIVE)  Final   Organism ID, Bacteria STAPHYLOCOCCUS SPECIES (COAGULASE NEGATIVE)  Final    Comment: The significance of isolating this organism from a single venipuncture cannot be predicted without further clinical and culture correlation Susceptibilities available only on request. Gram Stain Report Called to,Read Back By and Verified With: [facility] SCALES 11/26/14 1520 BY SMITHERSJ      Studies: Ct Head Wo Contrast  11/27/2014   CLINICAL DATA:  81 year old man with confusion, weakness and fever  EXAM: CT HEAD WITHOUT CONTRAST  TECHNIQUE: Contiguous axial images were obtained from the base of the skull through the vertex without intravenous contrast.  COMPARISON:  Prior head CT 10/01/2013; prior brain MRI 10/02/2013  FINDINGS: Mild image degradation secondary to motion related artifact. Negative for acute intracranial hemorrhage, acute infarction, mass, mass effect, hydrocephalus or midline shift. Gray-white differentiation is preserved throughout. Stable appearance of a moderately severe periventricular, subcortical and deep white matter hypoattenuation most consistent with chronic microvascular ischemic white matter disease. Cortical cerebral atrophy is also unchanged. Mild ex vacuo ventriculomegaly. No focal soft tissue or calvarial abnormality. Unremarkable globes and orbits. Normal aeration mastoid air cells and paranasal sinuses.  IMPRESSION: No acute intracranial abnormality.  Stable pattern of cerebral cortical atrophy and moderately advanced chronic microvascular ischemic white matter disease.   Electronically Signed   By: Jacqulynn Cadet M.D.   On: 11/27/2014 19:37   Dg Chest Port 1 View  11/27/2014   CLINICAL DATA:  Fever for 4 days  EXAM: PORTABLE CHEST - 1 VIEW  COMPARISON:  11/25/2014  FINDINGS: Cardiac shadow is stable but accentuated by the portable technique. The  lungs are well aerated without focal infiltrate or sizable effusion. Aortic atherosclerotic changes again noted without aneurysmal dilatation. No bony abnormality is seen.  IMPRESSION: No acute abnormality noted.  Aortic atherosclerosis.   Electronically Signed   By: Inez Catalina M.D.   On: 11/27/2014 19:02    Scheduled Meds: . aspirin  81 mg Oral BID  . doxycycline (VIBRAMYCIN) IV  200 mg Intravenous Q12H  . metoprolol succinate  50 mg Oral BID  . multivitamin  1 tablet Oral Daily  . nicotine  21 mg Transdermal Daily  . piperacillin-tazobactam (ZOSYN)  IV  3.375 g Intravenous Q8H  . pravastatin  40 mg Oral q1800  . sodium chloride  3 mL Intravenous Q12H  . vancomycin  750 mg Intravenous Q12H   Continuous Infusions: . sodium chloride 125 mL/hr at 11/28/14 0420   Antibiotics Given (last 72 hours)    Date/Time Action Medication Dose Rate   11/27/14 2116 Given   vancomycin (VANCOCIN) IVPB 750 mg/150 ml premix 750 mg 150 mL/hr   11/27/14 2308 Given   doxycycline (VIBRAMYCIN) 200 mg in dextrose 5 % 250 mL IVPB 200 mg 125 mL/hr   11/28/14 0419  Given   piperacillin-tazobactam (ZOSYN) IVPB 3.375 g 3.375 g 12.5 mL/hr   11/28/14 0807 Given   vancomycin (VANCOCIN) IVPB 750 mg/150 ml premix 750 mg 150 mL/hr      Principal Problem:   Fever Active Problems:   Essential hypertension, benign   Pure hypercholesterolemia   Prostate cancer   Type 2 diabetes mellitus without complication   Murmur   TIA (transient ischemic attack)   Aortic atherosclerosis   Sepsis   Acute encephalopathy   Thrombocytopenia    Time spent: 25 min    Glayds Insco  [facility] Pager [phone]. If 7PM-7AM, please contact night-coverage at [email or web address], password [facility] 11/28/2014, 10:46 AM  LOS: 1 day

## 2014-11-28 NOTE — Progress Notes (Signed)
OT Cancellation Note  Patient Details Name: Jeffrey [name] MRN: [id number] DOB: Dec 28, 1932   Cancelled Treatment:    Reason Eval/Treat Not Completed: Medical issues which prohibited therapy Discussed pt case with PT who had spoken to RN. Per MD, hold therapies today to let pt rest. Will check back tomorrow for appropriateness to participate in OT eval.   Evadne Ose A 11/28/2014, 11:48 AM

## 2014-11-28 NOTE — Consult Note (Signed)
PULMONARY / CRITICAL CARE MEDICINE   Name: Jeffrey [name] MRN: [id number] DOB: Aug 09, 1932    ADMISSION DATE:  11/27/2014 CONSULTATION DATE:  11/28/2014  REFERRING MD :  Jeffrey [name]  CHIEF COMPLAINT:  AMS  INITIAL PRESENTATION:  6/29 Admitted to [facility] with 4 days of fever, malaise and 1 day AMS. Found to be thrombocytopenic. Possible insect bite recently. Diffuse maculopapular rash. Transferred to ICU evening of 6/30 with markedly worsened cognition and labored breathing. Intubated. PCCM assumed care   MAJOR EVENTS/TEST RESULTS: 6/29 Admitted to [facility] with 4 days of fever, malaise and 1 day AMS. Found to be thrombocytopenic. Possible insect bite recently. Diffuse maculopapular rash.  6/29 CT head: no acute process 6/30 ID consultation: DC vanc/zosyn. Continue doxycycline. Recommended LP to r/o encephalitis 6/30 Neuro consultation: LP performed, MRI ordered 6/30 LP: no initial opening pressure documented. 15 cc clear fluid. Protein elevated @ 111. 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  Remote lacunar infarcts 6/30 PM Transferred to ICU with markedly worsened cognition and labored breathing. Intubated. PCCM assumed care 6/30 TTE (ordered):   INDWELLING DEVICES:: ETT 6/30 >>  L IJ CVL 6/30 >>   MICRO DATA: Blood 6/27 (as outpt) >> 1/1 coag neg staph Urine 6/29 >>  Resp 6/30 >>  Blood 6/29 >>    ANTIMICROBIALS:  Ceftriaxone 6/29 >> 6/29 Pip-tazo6/29 >> 6/30 Vancomycin 6/29 >> 6/30, 6/30 >>  Doxycycline 6/29 >>  Amp-sulbactam 6/30 >>     HISTORY OF PRESENT ILLNESS:  Pt is encephalopathic; therefore, this HPI is obtained from chart review. Jeffrey [name] is a 81 y.o. M with PMH as outlined below.  He presented to [facility] ED 6/29 with fevers and confusion.  Apparently he has had fever since Saturday 6/25; however, had improvement over the next few days and family thought he was on the mend; however, starting AM 6/29, he spiked a fever to 102  again and developed generalized weakness, confusion, and lethargy.  Due to gradual worsening over the day, family opted to take him to ED for further evaluation.  In ED, he had temp of 102.1, tachy in low 100's, thrombocytopenia.  CT head was negative for acute process.  He was admitted for concern of sepsis.  The following afternoon 6/30, he had LP performed by neuro.  Results still pending.  Following LP, he received 1mg  ativan prior to going down for MRI brain.  Per pt's granddaughter (who is nurse on neuro ICU), pt had rapid shallow respirations and aphasia throughout the day.  Post MRI, he continued to have tachypnea; however, started to have some gurgling respirations and accessory muscle use.  Granddaughter also felt that pt was less responsive and more lethargic.  PCCM was called for concerns of airway protection and for transfer to ICU.   He has not had any headaches, neck pain, chest pain, SOB, N/V/D, abd pain, dysuria, polyuria, myalgias.  He did feel as if he had been bitten by an insect a few weeks ago and developed mild rash around the site of bite on left medial thigh.  Rash was described as red borders with a black central dot, it apparently resolved on it's own over the coarse of a few days.  He was reportedly seen by PCP 2 days prior to ED presentation.  UA was negative and culture showed multiple bacterial morphocytes.  Blood cultures were positive for coag neg staph species - likely contaminent.    PAST MEDICAL HISTORY :  has a past medical history of Hypertension; Hypercholesteremia; Impaired fasting glucose; Prostate cancer (radiation + seeding implant (Jeffrey [name])); Rotator cuff tear, right (10/2010); Diabetes mellitus; Macular degeneration; Heart murmur; Radiation proctitis (11/2012); TIA (transient ischemic attack) (10/02/13); Type 2 diabetes mellitus with microalbuminuria or microproteinuria (01/15/2014); Aortic atherosclerosis (11/25/2014); and Acute encephalopathy (11/27/2014).  has  past surgical history that includes Tonsillectomy and adenoidectomy (age 82); prostate seed implant; Rotator cuff repair (06/16/2011); and Colonoscopy (N/A, 11/28/2012). Prior to Admission medications   Medication Sig Start Date End Date Taking? Authorizing Provider  Acetaminophen (TYLENOL ARTHRITIS PAIN PO) Take 2 tablets by mouth 2 (two) times daily.     Yes Historical Provider, MD  amLODipine (NORVASC) 2.5 MG tablet TAKE 1 TABLET (2.5 MG TOTAL) BY MOUTH DAILY. 10/17/14  Yes Jeffrey Ohara, MD  aspirin 81 MG tablet Take 81 mg by mouth 2 (two) times daily.    Yes Historical Provider, MD  doxylamine, Sleep, (EQ SLEEP AID) 25 MG tablet Take 25 mg by mouth at bedtime as needed for sleep.   Yes Historical Provider, MD  hydrocortisone cream 1 % Apply 1 application topically as needed (for ankles).   Yes Historical Provider, MD  lovastatin (MEVACOR) 20 MG tablet Take 2 tablets (40 mg total) by mouth at bedtime. 07/17/14  Yes Jeffrey Ohara, MD  metoprolol succinate (TOPROL-XL) 50 MG 24 hr tablet TAKE 1 TABLET BY MOUTH TWICE A DAY TAKE WITH OR IMMEDIATELY FOLLOWING A MEAL 10/03/14  Yes Jeffrey Ohara, MD  Multiple Vitamins-Minerals (PRESERVISION AREDS) CAPS Take 1 capsule by mouth every morning.   Yes Historical Provider, MD  naproxen sodium (ANAPROX) 220 MG tablet Take 220 mg by mouth 2 (two) times daily as needed (for pain).   Yes Historical Provider, MD   No Known Allergies  FAMILY HISTORY:  Family History  Problem Relation Age of Onset  . Heart disease Father 5    Died suddenly.  No diagnosis  . Stroke Sister   . Dementia Mother   . Diabetes Mother   . Cancer Daughter     ovarian (?) vs other male cancer; s/p hyst doing well  . Diabetes Daughter     GDM, and now AODM  . Heart disease Son     congestive heart failure--improved    SOCIAL HISTORY:  reports that he quit smoking about 34 years ago. His smoking use included Cigarettes. He has a 20 pack-year smoking history. He has never used smokeless tobacco.  He reports that he drinks about 1.2 oz of alcohol per week. He reports that he does not use illicit drugs.  REVIEW OF SYSTEMS:  Level 5 caveat  SUBJECTIVE:   VITAL SIGNS: Temp:  [97.4 F (36.3 C)-102.1 F (38.9 C)] 98.8 F (37.1 C) (06/30 1313) Pulse Rate:  [96-112] 107 (06/30 1430) Resp:  [18-38] 18 (06/30 1313) BP: (109-137)/(51-75) 121/68 mmHg (06/30 1430) SpO2:  [92 %-95 %] 93 % (06/30 1313) HEMODYNAMICS:   VENTILATOR SETTINGS:   INTAKE / OUTPUT: Intake/Output      06/29 0701 - 06/30 0700 06/30 0701 - 07/01 0700   P.O.  120   I.V. (mL/kg) 1208 (15.5)    IV Piggyback 300    Total Intake(mL/kg) 1508 (19.3) 120 (1.5)   Urine (mL/kg/hr) 450    Total Output 450     Net +1058 +120        Urine Occurrence  2 x   Stool Occurrence  0 x     PHYSICAL EXAMINATION: General: RASS -2, somnolent, tachypneic, +/-  F/C Neuro: EOMI, PERRL, MAEs, DTRs symmetric HEENT: NCAT, poor oral hygiene, thick mucoid secretions in oropharynx Cardiovascular: tachy, reg, soft syst M @ LSB Lungs: labored, no adventitious sounds Abdomen: BS x 4, soft, NT/ND.  Ext: warm, no edema Skin: diffuse subtle maculopapular rash on trunk and extremities  LABS:  CBC  Recent Labs Lab 11/25/14 1600 11/27/14 1829 11/28/14 0020  WBC 5.3 7.8 7.5  HGB 13.3 13.0 11.1*  HCT 39.8 37.8* 33.1*  PLT 137* 94* 83*   Coag's  Recent Labs Lab 11/27/14 2000 11/27/14 2226  APTT  --  35  INR 1.03  --    BMET  Recent Labs Lab 11/27/14 1829 11/28/14 0020  NA 132* 132*  K 4.1 3.6  CL 97* 97*  CO2 22 24  BUN 31* 26*  CREATININE 1.07 1.05  GLUCOSE 167* 180*   Electrolytes  Recent Labs Lab 11/27/14 1829 11/28/14 0020  CALCIUM 8.2* 7.6*   Sepsis Markers  Recent Labs Lab 11/27/14 1845 11/27/14 2226 11/28/14 0020  LATICACIDVEN 2.18* 1.1 1.7  PROCALCITON  --  1.62  --    ABG No results for input(s): PHART, PCO2ART, PO2ART in the last 168 hours. Liver Enzymes  Recent Labs Lab  11/27/14 1829 11/28/14 0020  AST 84* 64*  ALT 48 40  ALKPHOS 53 49  BILITOT 1.0 0.7  ALBUMIN 3.2* 2.6*   Cardiac Enzymes  Recent Labs Lab 11/28/14 1708  TROPONINI 0.05*   Glucose  Recent Labs Lab 11/28/14 0744 11/28/14 1436  GLUCAP 145* 131*     CXR: NACPD  EKG: NSR, no ischemic changes   ASSESSMENT / PLAN:  PULMONARY  A: Acute resp failure - intubated for AMS and increased WOB P:   Cont full vent support - settings reviewed and/or adjusted Cont vent bundle Daily SBT if/when meets criteria Follow CXR intermittently  CARDIOVASCULAR A:  Post intubation hypotension Chronic beta blocker therapy History of hypertension P:  Holding scheduled metoprolol PRN metoprolol to maintain HR < 115/min PRN hydralazine to maintain SBP < 170 mmHg CVP goal 10-14. PRN NS boluses to maintain goal  RENAL A:   AKI, mild, nonoliguric Suspect volume depletion P:   Monitor BMET intermittently Monitor I/Os Correct electrolytes as indicated Maintenance IVFs ordered  GASTROINTESTINAL A:   No issues P:   SUP: IV famotidine Consider nutrition 7/01  HEMATOLOGIC A:   Mild anemia without bleeding Thrombocytopenia  P:  DVT px: SCDs Monitor CBC intermittently Transfuse per usual ICU guidelines  INFECTIOUS A:   Severe sepsis Mildly elevated PCT Concern for encephalitis Concern for tick-borne infection P:   Micro and abx as above ID service following Discussed with Dr Johnnye Sima 6/30 PM  ENDOCRINE A:   Hyperglycemia without prior dx of DM P:   Hgb A1C ordered for 7/01 SSI - mod scale  NEUROLOGIC A:   Acute encephalopathy Acute CVA by MRI 6/30 P:   Sedation: dexmedetomidine, PRN fentanyl RASS goal: -1 Daily [facility] following   Family updated: Wife, daughters, granddaughter and others 6/30 PM  60 mins CCM time  Merton Border, MD ; [facility] service Mobile [phone].  After 5:30 PM or weekends, call [phone]   11/28/2014, 6:51 PM

## 2014-11-28 NOTE — Progress Notes (Signed)
  Called by charge RN on [facility] that one of their patients was in MRI scan and they had received report he was unresponsive.  The units RN Frederico Hamman was on his way to see patient.  Patient had received 1 mg IV Ativan prior to scan for sedation.  On my arrival to MRI patient was sleeping soundly - few snoring resps - rate 20 - O2sats 98% on 2 liter nasal cannula.  Arouses to noxious stimulus - purposeful and moans.  Did speak a few words to "stop that". NAD - sleepy from meds - placed HOB down and watched for airway protection - O2 sats remain stable - BP stable.  Dr. Eliseo Squires notified by Frederico Hamman RN - to continue with testing as long as patient remains stable with airway protection.  [place] to stay with patient .  To call as needed.

## 2014-11-28 NOTE — Progress Notes (Signed)
PT Cancellation Note  Patient Details Name: Jeffrey [name] MRN: [id number] DOB: 11/27/1932   Cancelled Treatment:    Reason Eval/Treat Not Completed: Medical issues which prohibited therapy. Discussed pt case with RN. Per MD, hold therapies today to let pt rest. Will check back tomorrow for appropriateness to participate in PT eval.    Rolinda Roan 11/28/2014, 9:50 AM   Rolinda Roan, PT, DPT Acute Rehabilitation Services Pager: [phone]

## 2014-11-28 NOTE — Progress Notes (Signed)
MD Eliseo Squires paged, patient had a 5 beat run of VTach, orders received. Will continue to monitor patient.

## 2014-11-28 NOTE — Progress Notes (Signed)
Consult Reason for Consult:fever and altered mental status Referring Physician: Dr Eliseo Squires [facility]  CC: fever and altered mental status  HPI: Jeffrey [name] is an 81 y.o. male with hx of TIA, DM, prostate CA presenting with fever and altered mental status since Saturday. Family notes difficulty speaking, trouble getting his words out. They also note some possible right sided weakness. Deny any recent medication changes, no tick exposure, no sick contacts. Denies a headache. At baseline they report he is active and independent.   CT head imaging reviewed and shows no acute process. No leukocytosis on labs. Na 132.   Past Medical History  Diagnosis Date  . Hypertension   . Hypercholesteremia   . Impaired fasting glucose   . Prostate cancer radiation + seeding implant (Dr.[name])  . Rotator cuff tear, right 10/2010    supraspinatous and infraspinatous  . Diabetes mellitus   . Macular degeneration   . Heart murmur     mild aortic stenosis  . Radiation proctitis 11/2012    treated with APC ablation and Canasa suppositories (Dr. Hilarie Fredrickson)  . TIA (transient ischemic attack) 10/02/13    hosp at [facility] x 1 night  . 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 Aortic atherosclerosis 11/25/2014  . Acute encephalopathy 11/27/2014    Past Surgical History  Procedure Laterality Date  . Tonsillectomy and adenoidectomy  age 78  . Prostate seed implant    . Rotator cuff repair  06/16/2011    right (Dr. Berenice Primas)  . Colonoscopy N/A 11/28/2012    Procedure: COLONOSCOPY;  Surgeon: Jerene Bears, MD;  Location: [facility];  Service: Gastroenterology;  Laterality: N/A;    Family History  Problem Relation Age of Onset  . Heart disease Father 87    Died suddenly.  No diagnosis  . Stroke Sister   . Dementia Mother   . Diabetes Mother   . Cancer Daughter     ovarian (?) vs other male cancer; s/p hyst doing well  . Diabetes Daughter     GDM, and now AODM  . Heart disease Son      congestive heart failure--improved    Social History:  reports that he quit smoking about 34 years ago. His smoking use included Cigarettes. He has a 20 pack-year smoking history. He has never used smokeless tobacco. He reports that he drinks about 1.2 oz of alcohol per week. He reports that he does not use illicit drugs.  No Known Allergies  Medications:  Scheduled: . aspirin  81 mg Oral BID  . doxycycline (VIBRAMYCIN) IV  200 mg Intravenous Q12H  . LORazepam  1 mg Intravenous Once  . metoprolol succinate  50 mg Oral BID  . multivitamin  1 tablet Oral Daily  . nicotine  21 mg Transdermal Daily  . pravastatin  40 mg Oral q1800  . sodium chloride  3 mL Intravenous Q12H     ROS: Out of a complete 14 system review, the patient complains of only the following symptoms, and all other reviewed systems are negative. +confusion  Physical Examination: Filed Vitals:   11/28/14 1313  BP: 137/75  Pulse: 102  Temp: 98.8 F (37.1 C)  Resp: 18   Physical Exam  Constitutional: He appears well-developed and well-nourished.  Psych: Affect appropriate to situation Eyes: No scleral injection HENT: No OP obstrucion Head: Normocephalic.  Cardiovascular: Normal rate and regular rhythm.  Respiratory: Effort normal and breath sounds normal.  GI: Soft. Bowel sounds are normal. No  distension. There is no tenderness.  Skin: WDI  Neurologic Examination Mental Status: Alert, oriented to name only. Follows simple commands, note apparent expressive aphasia.  Cranial Nerves: II: optic discs not visualized, visual fields grossly normal, pupils equal, round, reactive to light  III,IV, VI: ptosis not present, extra-ocular motions intact bilaterally V,VII: smile symmetric, facial light touch sensation normal bilaterally VIII: hearing normal bilaterally IX,X: gag reflex present XI: trapezius strength/neck flexion strength normal bilaterally XII: tongue strength normal  Motor: Right : Upper  extremity    Left:     Upper extremity 5/5 deltoid       5/5 deltoid 5/5 biceps      5/5 biceps  5/5 triceps      5/5 triceps 5/5 hand grip      5/5 hand grip  Lower extremity     Lower extremity 5-/5 hip flexor      5-/5 hip flexor 5/5 quadricep      5/5 quadriceps  5/5 hamstrings     5/5 hamstrings 5/5 plantar flexion       5/5 plantar flexion 5/5 plantar extension     5/5 plantar extension Tone and bulk:normal tone throughout; no atrophy noted Sensory: Pinprick and light touch intact throughout, bilaterally Deep Tendon Reflexes: 2+ and symmetric throughout Plantars: Right: downgoing   Left: downgoing Cerebellar: Unable to test due to mental status Gait: deferred No noted nuchal rigidity, negative Kernigs and Brudzinski sign  Laboratory Studies:   Basic Metabolic Panel:  Recent Labs Lab 11/27/14 1829 11/28/14 0020  NA 132* 132*  K 4.1 3.6  CL 97* 97*  CO2 22 24  GLUCOSE 167* 180*  BUN 31* 26*  CREATININE 1.07 1.05  CALCIUM 8.2* 7.6*    Liver Function Tests:  Recent Labs Lab 11/27/14 1829 11/28/14 0020  AST 84* 64*  ALT 48 40  ALKPHOS 53 49  BILITOT 1.0 0.7  PROT 6.7 5.6*  ALBUMIN 3.2* 2.6*   No results for input(s): LIPASE, AMYLASE in the last 168 hours. No results for input(s): AMMONIA in the last 168 hours.  CBC:  Recent Labs Lab 11/25/14 1600 11/27/14 1829 11/28/14 0020  WBC 5.3 7.8 7.5  NEUTROABS 4.8 6.7  --   HGB 13.3 13.0 11.1*  HCT 39.8 37.8* 33.1*  MCV 95.9 96.2 95.9  PLT 137* 94* 83*    Cardiac Enzymes: No results for input(s): CKTOTAL, CKMB, CKMBINDEX, TROPONINI in the last 168 hours.  BNP: Invalid input(s): POCBNP  CBG:  Recent Labs Lab 11/28/14 0744  GLUCAP 145*    Microbiology: Results for orders placed or performed during the hospital encounter of 11/27/14  Urine culture     Status: None (Preliminary result)   Collection Time: 11/27/14  7:44 PM  Result Value Ref Range Status   Specimen Description URINE, RANDOM   Final   Special Requests NONE  Final   Culture NO GROWTH < 24 HOURS  Final   Report Status PENDING  Incomplete    Coagulation Studies:  Recent Labs  11/27/14 2000  LABPROT 13.7  INR 1.03    Urinalysis:  Recent Labs Lab 11/25/14 1645 11/27/14 1944  COLORURINE  --  AMBER*  LABSPEC  --  1.027  PHURINE  --  5.5  GLUCOSEU  --  NEGATIVE  HGBUR  --  MODERATE*  BILIRUBINUR n SMALL*  KETONESUR  --  15*  PROTEINUR 2+ 100*  UROBILINOGEN negative 0.2  NITRITE negative NEGATIVE  LEUKOCYTESUR Negative NEGATIVE    Lipid Panel:  Component Value Date/Time   CHOL 166 04/10/2014 0925   TRIG 169* 04/10/2014 0925   HDL 53 04/10/2014 0925   CHOLHDL 3.1 04/10/2014 0925   VLDL 34 04/10/2014 0925   LDLCALC 79 04/10/2014 0925    HgbA1C:  Lab Results  Component Value Date   HGBA1C 5.8 10/17/2014    Urine Drug Screen:  No results found for: LABOPIA, COCAINSCRNUR, LABBENZ, AMPHETMU, THCU, LABBARB  Alcohol Level: No results for input(s): ETH in the last 168 hours.  Other results:  Imaging: Ct Head Wo Contrast  11/27/2014   CLINICAL DATA:  81 year old man with confusion, weakness and fever  EXAM: CT HEAD WITHOUT CONTRAST  TECHNIQUE: Contiguous axial images were obtained from the base of the skull through the vertex without intravenous contrast.  COMPARISON:  Prior head CT 10/01/2013; prior brain MRI 10/02/2013  FINDINGS: Mild image degradation secondary to motion related artifact. Negative for acute intracranial hemorrhage, acute infarction, mass, mass effect, hydrocephalus or midline shift. Gray-white differentiation is preserved throughout. Stable appearance of a moderately severe periventricular, subcortical and deep white matter hypoattenuation most consistent with chronic microvascular ischemic white matter disease. Cortical cerebral atrophy is also unchanged. Mild ex vacuo ventriculomegaly. No focal soft tissue or calvarial abnormality. Unremarkable globes and orbits. Normal  aeration mastoid air cells and paranasal sinuses.  IMPRESSION: No acute intracranial abnormality.  Stable pattern of cerebral cortical atrophy and moderately advanced chronic microvascular ischemic white matter disease.   Electronically Signed   By: Jacqulynn Cadet M.D.   On: 11/27/2014 19:37   Dg Chest Port 1 View  11/27/2014   CLINICAL DATA:  Fever for 4 days  EXAM: PORTABLE CHEST - 1 VIEW  COMPARISON:  11/25/2014  FINDINGS: Cardiac shadow is stable but accentuated by the portable technique. The lungs are well aerated without focal infiltrate or sizable effusion. Aortic atherosclerotic changes again noted without aneurysmal dilatation. No bony abnormality is seen.  IMPRESSION: No acute abnormality noted.  Aortic atherosclerosis.   Electronically Signed   By: Inez Catalina M.D.   On: 11/27/2014 19:02     Assessment/Plan:  82y/o gentleman admitted with 5 day history of fever and altered mental status. Currently afebrile with no leukocytosis. Exam notable for confusion and signs of an expressive aphasia. Concern for possible CVA. Infectious etiology such as encephalitis also in the differential.   -LP completed, will follow up CSF studies -MRI brain pending. If positive for stroke can complete full stroke workup -check ammonia -appreciate ID input  Jim Like, DO [facility] [phone]  If 7pm- 7am, please page neurology on call as listed in [place]. 11/28/2014, 1:30 PM

## 2014-11-28 NOTE — Progress Notes (Signed)
Patient transferred to [facility] room 7, report given to [facility], all questions answered to his satisfaction.

## 2014-11-28 NOTE — Progress Notes (Signed)
Echocardiogram 2D Echocardiogram with Definity has been performed.  Tresa Res 11/28/2014, 9:17 PM

## 2014-11-28 NOTE — Progress Notes (Addendum)
Patient was administered ativan 1mg  IV pre MRI (family made [place] aware patient was claustrophobic and was unable to complete a previous MRI for this reason). This was administered as ordered right before the patient was taken down to MRI by patient transport.  I was notified by MRI that the patient was not arousable. I immediately went down to MRI to assess the patient. Upon my assessment the patient was arousable to voice and pain and his vital signs were stable (RN Rapid Response Godfrey Pick  present).   After speaking with DO Eliseo Squires it was decided the patient would get the MRI and I would stay down and monitor the patient throughout the MRI. The patient's vital signs remained stable and he tolerated the MRI well. I was present from start to finish when the patient arrived back in his room on [facility]. MRI came back positive for stroke, MD Vann aware of stroke results.  Patient to be transferred to stepdown, orders received.

## 2014-11-28 NOTE — Procedures (Signed)
Central Venous Catheter Insertion Procedure Note LASALLE ABEE [id number] Dec 22, 1932  Procedure: Insertion of Central Venous Catheter Indications: Assessment of intravascular volume, Drug and/or fluid administration and Frequent blood sampling  Procedure Details Consent: Risks of procedure as well as the alternatives and risks of each were explained to the (patient/caregiver).  Consent for procedure obtained. Time Out: Verified patient identification, verified procedure, site/side was marked, verified correct patient position, special equipment/implants available, medications/allergies/relevent history reviewed, required imaging and test results available.  Performed  Maximum sterile technique was used including antiseptics, cap, gloves, gown, hand hygiene, mask and sheet. Skin prep: Chlorhexidine; local anesthetic administered A antimicrobial bonded/coated triple lumen catheter was placed in the left internal jugular vein using the Seldinger technique.  Evaluation Blood flow good Complications: No apparent complications Patient did tolerate procedure well. Chest X-ray ordered to verify placement.  CXR: pending.  Procedure performed under direct ultrasound guidance for real time vessel cannulation.      Montey Hora, [place] [facility] Pager: [phone]  or [phone] 11/28/2014, 8:18 PM   I was present for and supervised the entire procedure  Merton Border, MD ; [facility] service Mobile [phone].  After 5:30 PM or weekends, call [phone]

## 2014-11-28 NOTE — Progress Notes (Signed)
ANTIBIOTIC CONSULT NOTE -   Pharmacy Consult for Vancomycin (resume), Unasyn Indication: rule out sepsis, aspiration PNA  No Known Allergies  Patient Measurements: Height: 5\' 11"  (180.3 cm) Weight: 173 lb 15.1 oz (78.9 kg) IBW/kg (Calculated) : 75.3 Adjusted Body Weight:   Vital Signs: Temp: 100.9 F (38.3 C) (06/30 1852) Temp Source: Oral (06/30 1852) BP: 155/74 mmHg (06/30 2002) Pulse Rate: 105 (06/30 2002) Intake/Output from previous day: 06/29 0701 - 06/30 0700 In: 1508 [I.V.:1208; IV Piggyback:300] Out: 450 [Urine:450] Intake/Output from this shift:    Labs:  Recent Labs  11/27/14 1829 11/28/14 0020  WBC 7.8 7.5  HGB 13.0 11.1*  PLT 94* 83*  CREATININE 1.07 1.05   Estimated Creatinine Clearance: 59.8 mL/min (by C-G formula based on Cr of 1.05). No results for input(s): VANCOTROUGH, VANCOPEAK, VANCORANDOM, GENTTROUGH, GENTPEAK, GENTRANDOM, TOBRATROUGH, TOBRAPEAK, TOBRARND, AMIKACINPEAK, AMIKACINTROU, AMIKACIN in the last 72 hours.   Microbiology: Recent Results (from the past 720 hour(s))  Urine culture     Status: None   Collection Time: 11/25/14  3:33 PM  Result Value Ref Range Status   Colony Count >=100,000 COLONIES/ML  Final   Organism ID, Bacteria Multiple bacterial morphotypes present, none  Final   Organism ID, Bacteria predominant. Suggest appropriate recollection if   Final   Organism ID, Bacteria clinically indicated.  Final  Culture, blood (single)     Status: None   Collection Time: 11/25/14  4:00 PM  Result Value Ref Range Status   Culture STAPHYLOCOCCUS SPECIES (COAGULASE NEGATIVE)  Final   Organism ID, Bacteria STAPHYLOCOCCUS SPECIES (COAGULASE NEGATIVE)  Final    Comment: The significance of isolating this organism from a single venipuncture cannot be predicted without further clinical and culture correlation Susceptibilities available only on request. Gram Stain Report Called to,Read Back By and Verified With: [facility] SCALES 11/26/14  1520 BY SMITHERSJ   Culture, blood (routine x 2)     Status: None (Preliminary result)   Collection Time: 11/27/14  6:31 PM  Result Value Ref Range Status   Specimen Description BLOOD LEFT FOREARM  Final   Special Requests BOTTLES DRAWN AEROBIC AND ANAEROBIC 5CC  Final   Culture NO GROWTH < 24 HOURS  Final   Report Status PENDING  Incomplete  Urine culture     Status: None (Preliminary result)   Collection Time: 11/27/14  7:44 PM  Result Value Ref Range Status   Specimen Description URINE, RANDOM  Final   Special Requests NONE  Final   Culture NO GROWTH < 24 HOURS  Final   Report Status PENDING  Incomplete  Culture, blood (routine x 2)     Status: None (Preliminary result)   Collection Time: 11/27/14  8:00 PM  Result Value Ref Range Status   Specimen Description BLOOD RIGHT FOREARM  Final   Special Requests BOTTLES DRAWN AEROBIC AND ANAEROBIC 5ML  Final   Culture NO GROWTH < 24 HOURS  Final   Report Status PENDING  Incomplete  CSF culture with Stat gram stain     Status: None (Preliminary result)   Collection Time: 11/28/14  1:10 PM  Result Value Ref Range Status   Specimen Description CSF  Final   Special Requests TUBE 2  Final   Gram Stain   Final    CYTOSPIN SMEAR WBC PRESENT,BOTH PMN AND MONONUCLEAR NO ORGANISMS SEEN    Culture PENDING  Incomplete   Report Status PENDING  Incomplete    Medical History: Past Medical History  Diagnosis Date  .  Hypertension   . Hypercholesteremia   . Impaired fasting glucose   . Prostate cancer radiation + seeding implant (Dr.[name])  . Rotator cuff tear, right 10/2010    supraspinatous and infraspinatous  . Diabetes mellitus   . Macular degeneration   . Heart murmur     mild aortic stenosis  . Radiation proctitis 11/2012    treated with APC ablation and Canasa suppositories (Dr. Hilarie Fredrickson)  . TIA (transient ischemic attack) 10/02/13    hosp at [facility] x 1 night  . Type 2 diabetes mellitus with microalbuminuria or microproteinuria  01/15/2014  . Aortic atherosclerosis 11/25/2014  . Acute encephalopathy 11/27/2014    Medications:  Prescriptions prior to admission  Medication Sig Dispense Refill Last Dose  . Acetaminophen (TYLENOL ARTHRITIS PAIN PO) Take 2 tablets by mouth 2 (two) times daily.     11/27/2014 at Unknown time  . amLODipine (NORVASC) 2.5 MG tablet TAKE 1 TABLET (2.5 MG TOTAL) BY MOUTH DAILY. 90 tablet 1 11/27/2014 at Unknown time  . aspirin 81 MG tablet Take 81 mg by mouth 2 (two) times daily.    11/27/2014 at Unknown time  . doxylamine, Sleep, (EQ SLEEP AID) 25 MG tablet Take 25 mg by mouth at bedtime as needed for sleep.   Past Week at Unknown time  . hydrocortisone cream 1 % Apply 1 application topically as needed (for ankles).   Past Week at Unknown time  . lovastatin (MEVACOR) 20 MG tablet Take 2 tablets (40 mg total) by mouth at bedtime. 180 tablet 1 11/26/2014 at Unknown time  . metoprolol succinate (TOPROL-XL) 50 MG 24 hr tablet TAKE 1 TABLET BY MOUTH TWICE A DAY TAKE WITH OR IMMEDIATELY FOLLOWING A MEAL 180 tablet 0 11/26/2014 at 2000  . Multiple Vitamins-Minerals (PRESERVISION AREDS) CAPS Take 1 capsule by mouth every morning.   11/27/2014 at Unknown time  . naproxen sodium (ANAPROX) 220 MG tablet Take 220 mg by mouth 2 (two) times daily as needed (for pain).   11/27/2014 at Unknown time   Scheduled:  .  stroke: mapping our early stages of recovery book   Does not apply Once  . [START ON 11/29/2014] aspirin  81 mg Per Tube Daily  . doxycycline (VIBRAMYCIN) IV  200 mg Intravenous Q12H  . famotidine (PEPCID) IV  20 mg Intravenous Q12H  . fentaNYL      . insulin aspart  0-15 Units Subcutaneous 6 times per day  . midazolam      . [START ON 11/29/2014] pravastatin  40 mg Per Tube q1800  . sodium chloride  3 mL Intravenous Q12H   Infusions:  . lactated ringers     Assessment: 81yo male presents with fever and AMS. Pharmacy is consulted to dose vancomycin for suspected sepsis. Pt was originally started on  ceftriaxone for what was thought to be a UTI. Will broaden coverage with initial dose of zosyn as well. Pt is febrile to 102.1, WBC wnl, sCr 1.1, LA 2.2.  6/30 PM - pharmacy asked to resume vancomycin and to start Unasyn for suspected aspiration PNA.    Goal of Therapy:  Vancomycin trough level 15-20 mcg/ml  Plan:  Resume Vancomycin 750 mg IV q 12h Unasyn 1.5g IV q 6 hrs. F/u culture results and plan of care.  Uvaldo Rising, BCPS  Clinical Pharmacist Pager [phone]  11/28/2014 8:24 PM

## 2014-11-28 NOTE — Progress Notes (Signed)
MRI + Appears to be in atrial fib- will get EKG Transfer to SDU as patient is septic and new a fib Eulogio Bear

## 2014-11-28 NOTE — Progress Notes (Signed)
Verbal order from Dr. Eliseo Squires to add focused stroke order set. All individual orders confirmed by MD.

## 2014-11-28 NOTE — Progress Notes (Signed)
Notified by Ginger RN Hydrographic surveyor) patient to be transferred to ICU.

## 2014-11-28 NOTE — Procedures (Signed)
Intubation Procedure Note KEIFFER PIPER [id number] 1932/06/25  Procedure: Intubation Indications: Respiratory insufficiency  Procedure Details Consent: Risks of procedure as well as the alternatives and risks of each were explained to the (patient/caregiver).  Consent for procedure obtained. Time Out: Verified patient identification, verified procedure, site/side was marked, verified correct patient position, special equipment/implants available, medications/allergies/relevent history reviewed, required imaging and test results available.  Performed  Drugs:  100 mcg Fentanyl, 2 mg Versed, 20 mg Etomidate, 100 mg Rocuronium. IDL x 1 using glidescope with # 3 blade. Grade 1 view - copious thick secretions noted in posterior oropharynx. 7.5 tube visualized passing through vocal cords. Following intubation:  positive color change on ETCO2, condensation seen in endotracheal tube, equal breath sounds bilaterally.  Evaluation Hemodynamic Status: BP stable throughout; O2 sats: stable throughout Patient's Current Condition: stable Complications: No apparent complications Patient did tolerate procedure well. Chest X-ray ordered to verify placement.  CXR: pending.  Of note, copious thick secretions noted in posterior oropharynx and on epiglottis during intubation.   Montey Hora, [place] [facility] Pager: [phone]  or [phone] 11/28/2014, 8:16 PM   I was present for and supervised the entire procedure  Merton Border, MD ; [facility] service Mobile [phone].  After 5:30 PM or weekends, call [phone]

## 2014-11-28 NOTE — Consult Note (Signed)
[name] for Infectious Disease     Reason for Consult: fever    Referring Physician: Dr. Eliseo [name]  Principal Problem:   Fever Active Problems:   Essential hypertension, benign   Pure hypercholesterolemia   Prostate cancer   Type 2 diabetes mellitus without complication   Murmur   TIA (transient ischemic attack)   Aortic atherosclerosis   Sepsis   Acute encephalopathy   Thrombocytopenia   . aspirin  81 mg Oral BID  . doxycycline (VIBRAMYCIN) IV  200 mg Intravenous Q12H  . metoprolol succinate  50 mg Oral BID  . multivitamin  1 tablet Oral Daily  . nicotine  21 mg Transdermal Daily  . piperacillin-tazobactam (ZOSYN)  IV  3.375 g Intravenous Q8H  . pravastatin  40 mg Oral q1800  . sodium chloride  3 mL Intravenous Q12H  . vancomycin  750 mg Intravenous Q12H    Recommendations: LP for ? Encephalitis - would check cell count, HSV PCR, culture MRI when able Consider neurology input regarding weakness of right side, concern for CVA vs encephalitis Continue doxycycline Stop vancomycin and zosyn   Assessment: He has confusion, fever and some mild focal weakness on right side concerning for encephalitis.   He also has hyponatremia, new thrombocytopenia bringing up the consideration of RMSF, though no known exposure.    Antibiotics: Doxycycline, vancomycin, zosyn  HPI: Jeffrey [name] is a 81 y.o. male with hx of prostate cancer, radiation proctitis, TIA, DM who presented with fever and confusion.  Has been weak, unable to get up, difficulty walking, fever to 102 since about last Saturday.  No known tick exposures, no sick contact, no travel.  No diarrhea, no rash, no dysuria, no SOB, no cough.  Continues to be confused, even without fever.  Typically is active, independent and does ADL.     Review of Systems: A comprehensive review of systems was negative.  Past Medical History  Diagnosis Date  . Hypertension   . Hypercholesteremia   . Impaired fasting glucose   .  Prostate cancer radiation + seeding implant (Dr.[name])  . Rotator cuff tear, right 10/2010    supraspinatous and infraspinatous  . Diabetes mellitus   . Macular degeneration   . Heart murmur     mild aortic stenosis  . Radiation proctitis 11/2012    treated with APC ablation and Canasa suppositories (Dr. Hilarie Fredrickson)  . TIA (transient ischemic attack) 10/02/13    hosp at [facility] x 1 night  . 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 Aortic atherosclerosis 11/25/2014  . Acute encephalopathy 11/27/2014    History  Substance Use Topics  . Smoking status: Former Smoker -- 1.00 packs/day for 20 years    Types: Cigarettes    Quit date: 05/31/1980  . Smokeless tobacco: Never Used  . Alcohol Use: 1.2 oz/week    2 Cans of beer per week     Comment: around 2 beers daily (occasionally a third) as max since hospitalizaton 5/15.    Family History  Problem Relation Age of Onset  . Heart disease Father 49    Died suddenly.  No diagnosis  . Stroke Sister   . Dementia Mother   . Diabetes Mother   . Cancer Daughter     ovarian (?) vs other male cancer; s/p hyst doing well  . Diabetes Daughter     GDM, and now AODM  . Heart disease Son     congestive heart failure--improved  No Known Allergies  OBJECTIVE: Blood pressure 109/51, pulse 96, temperature 97.4 F (36.3 C), temperature source Oral, resp. rate 18, height 5\' 11"  (1.803 m), weight 172 lb (78.019 kg), SpO2 95 %. General: awake, some confusion Skin: no rashes Lungs: CTA B Cor: Tachy with 2/6 SEM Abdomen: soft, nt, nd Ext: no edema  Microbiology: Recent Results (from the past 240 hour(s))  Urine culture     Status: None   Collection Time: 11/25/14  3:33 PM  Result Value Ref Range Status   Colony Count >=100,000 COLONIES/ML  Final   Organism ID, Bacteria Multiple bacterial morphotypes present, none  Final   Organism ID, Bacteria predominant. Suggest appropriate recollection if   Final   Organism  ID, Bacteria clinically indicated.  Final  Culture, blood (single)     Status: None   Collection Time: 11/25/14  4:00 PM  Result Value Ref Range Status   Culture STAPHYLOCOCCUS SPECIES (COAGULASE NEGATIVE)  Final   Organism ID, Bacteria STAPHYLOCOCCUS SPECIES (COAGULASE NEGATIVE)  Final    Comment: The significance of isolating this organism from a single venipuncture cannot be predicted without further clinical and culture correlation Susceptibilities available only on request. Gram Stain Report Called to,Read Back By and Verified With: Hodge 11/26/14 1520 BY SMITHERSJ   Urine culture     Status: None (Preliminary result)   Collection Time: 11/27/14  7:44 PM  Result Value Ref Range Status   Specimen Description URINE, RANDOM  Final   Special Requests NONE  Final   Culture NO GROWTH < 24 HOURS  Final   Report Status PENDING  Incomplete    Scharlene Gloss, [place] for Infectious [facility] [email or web address] [phone] pager  [phone] cell 11/28/2014, 12:23 PM

## 2014-11-28 NOTE — Progress Notes (Signed)
Report called to [facility].

## 2014-11-29 ENCOUNTER — Inpatient Hospital Stay (HOSPITAL_COMMUNITY): Payer: Medicare Other

## 2014-11-29 DIAGNOSIS — R509 Fever, unspecified: Secondary | ICD-10-CM

## 2014-11-29 DIAGNOSIS — A419 Sepsis, unspecified organism: Principal | ICD-10-CM

## 2014-11-29 DIAGNOSIS — R6521 Severe sepsis with septic shock: Secondary | ICD-10-CM

## 2014-11-29 DIAGNOSIS — I6381 Other cerebral infarction due to occlusion or stenosis of small artery: Secondary | ICD-10-CM | POA: Insufficient documentation

## 2014-11-29 DIAGNOSIS — J9601 Acute respiratory failure with hypoxia: Secondary | ICD-10-CM

## 2014-11-29 DIAGNOSIS — I639 Cerebral infarction, unspecified: Secondary | ICD-10-CM

## 2014-11-29 LAB — BASIC METABOLIC PANEL
ANION GAP: 8 (ref 5–15)
BUN: 28 mg/dL — AB (ref 6–20)
CALCIUM: 7.6 mg/dL — AB (ref 8.9–10.3)
CO2: 22 mmol/L (ref 22–32)
CREATININE: 1.01 mg/dL (ref 0.61–1.24)
Chloride: 107 mmol/L (ref 101–111)
GFR calc Af Amer: >60 mL/min (ref >60)
GFR calc non Af Amer: >60 mL/min (ref >60)
GLUCOSE: 128 mg/dL — AB (ref 65–99)
Potassium: 3.9 mmol/L (ref 3.5–5.1)
Sodium: 137 mmol/L (ref 135–145)

## 2014-11-29 LAB — URINE CULTURE: CULTURE: NO GROWTH

## 2014-11-29 LAB — HERPES SIMPLEX VIRUS(HSV) DNA BY PCR
HSV 1 DNA: NEGATIVE
HSV 2 DNA: NEGATIVE

## 2014-11-29 LAB — GLUCOSE, CAPILLARY
GLUCOSE-CAPILLARY: 123 mg/dL — AB (ref 65–99)
GLUCOSE-CAPILLARY: 99 mg/dL (ref 65–99)
Glucose-Capillary: 114 mg/dL — ABNORMAL HIGH (ref 65–99)
Glucose-Capillary: 116 mg/dL — ABNORMAL HIGH (ref 65–99)
Glucose-Capillary: 112 mg/dL — ABNORMAL HIGH (ref 65–99)

## 2014-11-29 LAB — CBC
HCT: 30.1 % — ABNORMAL LOW (ref 39.0–52.0)
Hemoglobin: 9.9 g/dL — ABNORMAL LOW (ref 13.0–17.0)
MCH: 31.7 pg (ref 26.0–34.0)
MCHC: 32.9 g/dL (ref 30.0–36.0)
MCV: 96.5 fL (ref 78.0–100.0)
Platelets: 112 10*3/uL — ABNORMAL LOW (ref 150–400)
RBC: 3.12 MIL/uL — ABNORMAL LOW (ref 4.22–5.81)
RDW: 13.7 % (ref 11.5–15.5)
WBC: 12.6 10*3/uL — ABNORMAL HIGH (ref 4.0–10.5)

## 2014-11-29 LAB — B. BURGDORFI ANTIBODIES: B burgdorferi Ab IgG+IgM: <0.91 {ISR} (ref 0.00–0.90)

## 2014-11-29 LAB — HAPTOGLOBIN: Haptoglobin: 217 mg/dL — ABNORMAL HIGH (ref 34–200)

## 2014-11-29 LAB — ROCKY MTN SPOTTED FVR ABS PNL(IGG+IGM)
RMSF IgG: NEGATIVE
RMSF IgM: 1.88 index — ABNORMAL HIGH (ref 0.00–0.89)

## 2014-11-29 LAB — TROPONIN I: Troponin I: 0.07 ng/mL — ABNORMAL HIGH (ref <0.031)

## 2014-11-29 MED ORDER — NOREPINEPHRINE BITARTRATE 1 MG/ML IV SOLN
2.0000 ug/min | INTRAVENOUS | Status: DC
  Administered 2014-11-29: 5 ug/min via INTRAVENOUS
  Filled 2014-11-29: qty 16

## 2014-11-29 MED ORDER — ACETAMINOPHEN 10 MG/ML IV SOLN
1000.0000 mg | Freq: Four times a day (QID) | INTRAVENOUS | Status: DC | PRN
  Filled 2014-11-29: qty 100

## 2014-11-29 MED ORDER — PIPERACILLIN-TAZOBACTAM 3.375 G IVPB
3.3750 g | Freq: Three times a day (TID) | INTRAVENOUS | Status: DC
  Administered 2014-11-29 – 2014-12-01 (×6): 3.375 g via INTRAVENOUS
  Filled 2014-11-29 (×7): qty 50

## 2014-11-29 MED ORDER — DOXYCYCLINE HYCLATE 100 MG IV SOLR
100.0000 mg | Freq: Two times a day (BID) | INTRAVENOUS | Status: DC
  Administered 2014-11-29 – 2014-12-01 (×5): 100 mg via INTRAVENOUS
  Filled 2014-11-29 (×6): qty 100

## 2014-11-29 MED ORDER — ACETAMINOPHEN 10 MG/ML IV SOLN
1000.0000 mg | Freq: Four times a day (QID) | INTRAVENOUS | Status: DC
  Administered 2014-11-29: 1000 mg via INTRAVENOUS
  Filled 2014-11-29 (×3): qty 100

## 2014-11-29 NOTE — Evaluation (Signed)
Occupational Therapy Evaluation Patient Details Name: Jeffrey [name] MRN: [id number] DOB: 05/07/1933 Today's Date: 11/29/2014    History of Present Illness 6/29 Admitted to [facility] with 4 days of fever, malaise and 1 day AMS. Found to be thrombocytopenic. Possible insect bite recently. Diffuse maculopapular rash. Transferred to ICU evening of 6/30 with markedly worsened cognition and labored breathing. Intubated.  extubated 11/29/14   Clinical Impression   Pt admitted with above. He demonstrates the below listed deficits and will benefit from continued OT to maximize safety and independence with BADLs.  He presents to OT with generalized weakness, cognitive deficits, impaired balance.  Currently, he requires max a for ADLs.  Recommend CIR.  Will follow.       Follow Up Recommendations  CIR;Supervision/Assistance - 24 hour    Equipment Recommendations  3 in 1 bedside comode;Tub/shower seat    Recommendations for Other Services Rehab consult     Precautions / Restrictions Precautions Precautions: Fall Restrictions Weight Bearing Restrictions: No      Mobility Bed Mobility Overal bed mobility: Needs Assistance Bed Mobility: Supine to Sit;Sit to Supine     Supine to sit: Mod assist Sit to supine: Min assist   General bed mobility comments: required cues and assist to iniate supine to sit, and to scoot to EOB.  Better able to initiate and execute movement to return to supine   Transfers Overall transfer level: Needs assistance               General transfer comment: DId not attempt as pt fatigued while sitting EOB    Balance Overall balance assessment: Needs assistance Sitting-balance support: Feet supported Sitting balance-Leahy Scale: Fair                                      ADL Overall ADL's : Needs assistance/impaired Eating/Feeding: NPO   Grooming: Wash/dry face;Oral care;Wash/dry hands;Moderate assistance;Sitting   Upper Body Bathing:  Maximal assistance;Sitting   Lower Body Bathing: Maximal assistance;Sit to/from stand   Upper Body Dressing : Maximal assistance   Lower Body Dressing: Total assistance;Sit to/from stand   Toilet Transfer: Total assistance   Toileting- Clothing Manipulation and Hygiene: Total assistance         General ADL Comments: Pt moves slowly and requires multiple rest breaks     Vision Additional Comments: To be further assessed    Perception     Praxis      Pertinent Vitals/Pain Pain Assessment: No/denies pain     Hand Dominance Right   Extremity/Trunk Assessment Upper Extremity Assessment Upper Extremity Assessment: RUE deficits/detail;Generalized weakness;LUE deficits/detail RUE Deficits / Details: Pt with tremor bil. UE. Rt with h/o rotator cuff tear and resultant ROM LUE Deficits / Details: Pt with tremor bil.    Lower Extremity Assessment Lower Extremity Assessment: Generalized weakness   Cervical / Trunk Assessment Cervical / Trunk Assessment: Normal   Communication Communication Communication: No difficulties;HOH   Cognition Arousal/Alertness: Awake/alert Behavior During Therapy: Flat affect Overall Cognitive Status: Impaired/Different from baseline Area of Impairment: Orientation;Attention;Following commands;Safety/judgement;Awareness;Problem solving;Memory Orientation Level: Place;Time;Situation Current Attention Level: Sustained Memory: Decreased recall of precautions;Decreased short-term memory Following Commands: Follows one step commands consistently Safety/Judgement: Decreased awareness of safety;Decreased awareness of deficits   Problem Solving: Slow processing;Decreased initiation;Difficulty sequencing;Requires verbal cues;Requires tactile cues General Comments: Pt requires increast time to process info and to initiate activity    General Comments  Exercises       Shoulder Instructions      Home Living Family/patient expects to be  discharged to:: Private residence   Available Help at Discharge: Family;Available 24 hours/day Type of Home: House Home Access: Stairs to enter [employer] of Steps: 3 Entrance Stairs-Rails: Right Home Layout: One level     Bathroom Shower/Tub: Teacher, early years/pre: Standard         Additional Comments: family very supportive      Prior Functioning/Environment Level of Independence: Independent        Comments: Pt very active and enjoyed gardening     OT Diagnosis: Generalized weakness;Cognitive deficits   OT Problem List: Decreased strength;Decreased activity tolerance;Impaired balance (sitting and/or standing);Decreased coordination;Decreased cognition;Decreased safety awareness;Decreased knowledge of use of DME or AE;Decreased knowledge of precautions   OT Treatment/Interventions: Therapeutic exercise;Self-care/ADL training;Neuromuscular education;DME and/or AE instruction;Therapeutic activities;Cognitive remediation/compensation;Patient/family education;Balance training    OT Goals(Current goals can be found in the care plan section) Acute Rehab OT Goals Patient Stated Goal: did not state OT Goal Formulation: With patient/family Time For Goal Achievement: 12/13/14 Potential to Achieve Goals: Good ADL Goals Pt Will Perform Grooming: with min guard assist;standing Pt Will Perform Upper Body Bathing: with supervision Pt Will Perform Lower Body Bathing: with min assist;sit to/from stand Pt Will Perform Upper Body Dressing: with supervision;sitting Pt Will Perform Lower Body Dressing: with min assist;sit to/from stand Pt Will Transfer to Toilet: with max assist;regular height toilet;bedside commode;grab bars Pt Will Perform Toileting - Clothing Manipulation and hygiene: with min guard assist;sit to/from stand  OT Frequency: Min 2X/week   Barriers to D/C:            Co-evaluation              End of Session Equipment Utilized During  Treatment: Oxygen Nurse Communication: Mobility status  Activity Tolerance:   Patient left: in bed;with call bell/phone within reach;with family/visitor present   Time: 7672-0947 OT Time Calculation (min): 35 min Charges:  OT General Charges $OT Visit: 1 Procedure OT Evaluation $Initial OT Evaluation Tier I: 1 Procedure OT Treatments $Therapeutic Activity: 8-22 mins G-Codes:    Desteni Piscopo M 12-01-2014, 11:34 PM

## 2014-11-29 NOTE — Progress Notes (Signed)
Respiratory culture obtained via ET tube and sent to lab.

## 2014-11-29 NOTE — Progress Notes (Signed)
*  PRELIMINARY RESULTS* Vascular Ultrasound Carotid Duplex (Doppler) has been completed.  Preliminary findings: Right = 1-39% ICA stenosis. Left = 80-99% ICA stenosis.   Landry Mellow, RDMS, RVT  11/29/2014, 12:51 PM

## 2014-11-29 NOTE — Consult Note (Signed)
PULMONARY / CRITICAL CARE MEDICINE   Name: Jeffrey [name] MRN: [id number] DOB: 09-02-1932    ADMISSION DATE:  11/27/2014 CONSULTATION DATE:  11/29/2014  REFERRING MD :  Eliseo Squires  CHIEF COMPLAINT:  AMS  INITIAL PRESENTATION:  6/29 Admitted to [facility] with 4 days of fever, malaise and 1 day AMS. Found to be thrombocytopenic. Possible insect bite recently. Diffuse maculopapular rash. Transferred to ICU evening of 6/30 with markedly worsened cognition and labored breathing. Intubated. PCCM assumed care   MAJOR EVENTS/TEST RESULTS: 6/29 Admitted to [facility] with 4 days of fever, malaise and 1 day AMS. Found to be thrombocytopenic. Possible insect bite recently. Diffuse maculopapular rash.  6/29 CT head: no acute process 6/30 ID consultation: DC vanc/zosyn. Continue doxycycline. Recommended LP to r/o encephalitis 6/30 Neuro consultation: LP performed, MRI ordered 6/30 LP: no initial opening pressure documented. 15 cc clear fluid. Protein elevated @ 111. 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  Remote lacunar infarcts 6/30 PM Transferred to ICU with markedly worsened cognition and labored breathing. Intubated. PCCM assumed care 6/30 TTE (ordered):   INDWELLING DEVICES:: ETT 6/30 >>  L IJ CVL 6/30 >>   MICRO DATA: Blood 6/27 (as outpt) >> 1/1 coag neg staph Urine 6/29 >>  Resp 6/30 >>  Blood 6/29 >>    ANTIMICROBIALS:  Ceftriaxone 6/29 >> 6/29 Pip-tazo6/29 >> 6/30 Vancomycin 6/29 >> 6/30, 6/30 >>  Doxycycline 6/29 >>  Amp-sulbactam 6/30 >>     HISTORY OF PRESENT ILLNESS:  Pt is encephalopathic; therefore, this HPI is obtained from chart review. Jeffrey [name] is a 81 y.o. M with PMH as outlined below.  He presented to [facility] ED 6/29 with fevers and confusion.  Apparently he has had fever since Saturday 6/25; however, had improvement over the next few days and family thought he was on the mend; however, starting AM 6/29, he spiked a fever to 102 again  and developed generalized weakness, confusion, and lethargy.  Due to gradual worsening over the day, family opted to take him to ED for further evaluation.  In ED, he had temp of 102.1, tachy in low 100's, thrombocytopenia.  CT head was negative for acute process.  He was admitted for concern of sepsis.  The following afternoon 6/30, he had LP performed by neuro.  Results still pending.  Following LP, he received 1mg  ativan prior to going down for MRI brain.  Per pt's granddaughter (who is nurse on neuro ICU), pt had rapid shallow respirations and aphasia throughout the day.  Post MRI, he continued to have tachypnea; however, started to have some gurgling respirations and accessory muscle use.  Granddaughter also felt that pt was less responsive and more lethargic.  PCCM was called for concerns of airway protection and for transfer to ICU.   He has not had any headaches, neck pain, chest pain, SOB, N/V/D, abd pain, dysuria, polyuria, myalgias.  He did feel as if he had been bitten by an insect a few weeks ago and developed mild rash around the site of bite on left medial thigh.  Rash was described as red borders with a black central dot, it apparently resolved on it's own over the coarse of a few days.  He was reportedly seen by PCP 2 days prior to ED presentation.  UA was negative and culture showed multiple bacterial morphocytes.  Blood cultures were positive for coag neg staph species - likely contaminent.   SUBJECTIVE: No events, more  awake and interactive.  VITAL SIGNS: Temp:  [96.8 F (36 C)-102.6 F (39.2 C)] 96.8 F (36 C) (07/01 0900) Pulse Rate:  [58-116] 61 (07/01 0900) Resp:  [14-39] 14 (07/01 0900) BP: (70-155)/(45-76) 103/56 mmHg (07/01 0900) SpO2:  [93 %-100 %] 100 % (07/01 0900) FiO2 (%):  [40 %-100 %] 40 % (07/01 0802) Weight:  [78.9 kg (173 lb 15.1 oz)] 78.9 kg (173 lb 15.1 oz) (06/30 1853)   HEMODYNAMICS: CVP:  [5 mmHg-14 mmHg] 7 mmHg   VENTILATOR SETTINGS: Vent Mode:   [-] PRVC FiO2 (%):  [40 %-100 %] 40 % Set Rate:  [14 bmp-20 bmp] 14 bmp Vt Set:  [600 mL] 600 mL PEEP:  [5 cmH20] 5 cmH20 Plateau Pressure:  [18 cmH20-19 cmH20] 18 cmH20   INTAKE / OUTPUT: Intake/Output      06/30 0701 - 07/01 0700 07/01 0701 - 07/02 0700   P.O. 120    I.V. (mL/kg) 754.4 (9.6) 18.2 (0.2)   IV Piggyback 1550    Total Intake(mL/kg) 2424.4 (30.7) 18.2 (0.2)   Urine (mL/kg/hr) 316 (0.2) 155 (0.9)   Emesis/NG output 160 (0.1)    Total Output 476 155   Net +1948.4 -136.8        Urine Occurrence 2 x    Stool Occurrence 0 x     PHYSICAL EXAMINATION: General: Alert and interactive, moving all ext. Neuro: Moving all ext to commands, EOMI, PERRL, MAEs, DTRs symmetric HEENT: NCAT, PERRL, EOM-I and MMM, poor dentition. Cardiovascular: tachy, reg, soft syst M @ LSB Lungs: labored, no adventitious sounds Abdomen: +BS, soft, NT/ND.  Ext: warm, no edema Skin: diffuse subtle maculopapular rash on trunk and extremities improved.  LABS:  CBC  Recent Labs Lab 11/28/14 0020 11/28/14 2118 11/29/14 0433  WBC 7.5 8.9 12.6*  HGB 11.1* 10.7* 9.9*  HCT 33.1* 31.5* 30.1*  PLT 83* 82* 112*   Coag's  Recent Labs Lab 11/27/14 2000 11/27/14 2226  APTT  --  35  INR 1.03  --    BMET  Recent Labs Lab 11/28/14 0020 11/28/14 2118 11/29/14 0433  NA 132* 135 137  K 3.6 4.1 3.9  CL 97* 104 107  CO2 24 22 22   BUN 26* 24* 28*  CREATININE 1.05 1.06 1.01  GLUCOSE 180* 164* 128*   Electrolytes  Recent Labs Lab 11/28/14 0020 11/28/14 2118 11/29/14 0433  CALCIUM 7.6* 7.7* 7.6*   Sepsis Markers  Recent Labs Lab 11/27/14 1845 11/27/14 2226 11/28/14 0020  LATICACIDVEN 2.18* 1.1 1.7  PROCALCITON  --  1.62  --    ABG  Recent Labs Lab 11/28/14 2235  PHART 7.453*  PCO2ART 29.3*  PO2ART 469*   Liver Enzymes  Recent Labs Lab 11/27/14 1829 11/28/14 0020 11/28/14 2118  AST 84* 64* 56*  ALT 48 40 31  ALKPHOS 53 49 44  BILITOT 1.0 0.7 1.1  ALBUMIN 3.2*  2.6* 2.3*   Cardiac Enzymes  Recent Labs Lab 11/28/14 1708 11/28/14 2118 11/29/14 0433  TROPONINI 0.05* 0.10* 0.07*   Glucose  Recent Labs Lab 11/28/14 0744 11/28/14 1436 11/28/14 2051 11/28/14 2327 11/29/14 0334 11/29/14 0738  GLUCAP 145* 131* 132* 161* 114* 123*   CXR: NACPD  EKG: NSR, no ischemic changes  ASSESSMENT / PLAN:  PULMONARY  A: Acute resp failure - intubated for AMS and increased WOB P:   Extubate. Swallow evaluation. Titrate O2. IS per RT protocol. Follow CXR intermittently  CARDIOVASCULAR A:  Post intubation hypotension Chronic beta blocker therapy History of hypertension  P:  Holding scheduled metoprolol PRN metoprolol to maintain HR < 115/min PRN hydralazine to maintain SBP < 170 mmHg CVP goal 10-14. PRN NS boluses to maintain goal Levophed for BP support to off.  RENAL A:   AKI, mild, nonoliguric Suspect volume depletion P:   Monitor BMET intermittently Monitor I/Os Correct electrolytes as indicated KVO IVF  GASTROINTESTINAL A:   No issues P:   SUP: IV famotidine Swallow evaluation  HEMATOLOGIC A:   Mild anemia without bleeding Thrombocytopenia  P:  DVT px: SCDs Monitor CBC intermittently Transfuse per usual ICU guidelines  INFECTIOUS A:   Severe sepsis Mildly elevated PCT Concern for encephalitis Concern for tick-borne infection P:   Micro and abx as above ID service following Discussed with Dr Johnnye Sima 6/30 PM D/C unasyn and start zosyn.  ENDOCRINE A:   Hyperglycemia without prior dx of DM P:   Hgb A1C ordered for 7/01 SSI - mod scale  NEUROLOGIC A:   Acute encephalopathy Acute CVA by MRI 6/30 P:   D/C sedation. RASS goal: -1 Daily [facility] following   Family updated: Wife, daughters, granddaughter and others 7/1.  Will extubate and monitor for airway protection, that is my biggest concern.  The patient is critically ill with multiple organ systems failure and requires high complexity  decision making for assessment and support, frequent evaluation and titration of therapies, application of advanced monitoring technologies and extensive interpretation of multiple databases.   Critical Care Time devoted to patient care services described in this note is  35  Minutes. This time reflects time of care of this signee Dr Jennet Maduro. This critical care time does not reflect procedure time, or teaching time or supervisory time of PA/NP/Med student/Med Resident etc but could involve care discussion time.  Rush Farmer, M.D. [facility] Pulmonary/Critical Care Medicine. Pager: [phone]. After hours pager: [phone].   11/29/2014, 9:17 AM

## 2014-11-29 NOTE — Progress Notes (Signed)
PT Cancellation Note  Patient Details Name: MARLEE TRENTMAN MRN: [id number] DOB: 12/12/1932   Cancelled Treatment:    Reason Eval/Treat Not Completed: Patient not medically ready.  Pt transferred to ICU and intubated overnight.  Will hold PT eval today and f/u as appropriate.     Bion Todorov, Thornton Papas 11/29/2014, 9:08 AM

## 2014-11-29 NOTE — Progress Notes (Addendum)
Patient ID: Jeffrey [name], male   DOB: Jan 21, 1933, 82 y.o.   MRN: [id number]         [facility]    Date of Admission:  11/27/2014           Day 3 doxycycline        Vancomycin restarted last night        Piperacillin tazobactam restarted this morning  Principal Problem:   Fever Active Problems:   Essential hypertension, benign   Pure hypercholesterolemia   Prostate cancer   Type 2 diabetes mellitus without complication   Murmur   TIA (transient ischemic attack)   Aortic atherosclerosis   Sepsis   Acute encephalopathy   Thrombocytopenia   AKI (acute kidney injury)   Severe sepsis   Respiratory distress   . antiseptic oral rinse  7 mL Mouth Rinse QID  . aspirin  81 mg Per Tube Daily  . chlorhexidine  15 mL Mouth Rinse BID  . doxycycline (VIBRAMYCIN) IV  100 mg Intravenous Q12H  . famotidine (PEPCID) IV  20 mg Intravenous Q12H  . insulin aspart  0-15 Units Subcutaneous 6 times per day  . piperacillin-tazobactam (ZOSYN)  IV  3.375 g Intravenous Q8H  . pravastatin  40 mg Per Tube q1800  . sodium chloride  3 mL Intravenous Q12H  . vancomycin  750 mg Intravenous Q12H    Subjective: He is feeling better. He is asking how long I think he will need to be in the hospital. He does not have much recall of the events leading up to his hospitalization. He became sedated after receiving Ativan yesterday and required intubation. He was successfully extubated this morning. His wife and daughter tell me that he works in his garden every day. He has no known tick exposure. He was not on any anabiotic therapy prior to admission.  Review of Systems: Review of systems not obtained due to patient factors.  Past Medical History  Diagnosis Date  . Hypertension   . Hypercholesteremia   . Impaired fasting glucose   . Prostate cancer radiation + seeding implant (Dr.[name])  . Rotator cuff tear, right 10/2010    supraspinatous and infraspinatous  . Diabetes mellitus    . Macular degeneration   . Heart murmur     mild aortic stenosis  . Radiation proctitis 11/2012    treated with APC ablation and Canasa suppositories (Dr. Hilarie Fredrickson)  . TIA (transient ischemic attack) 10/02/13    hosp at [facility] x 1 night  . 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 Aortic atherosclerosis 11/25/2014  . Acute encephalopathy 11/27/2014    History  Substance Use Topics  . Smoking status: Former Smoker -- 1.00 packs/day for 20 years    Types: Cigarettes    Quit date: 05/31/1980  . Smokeless tobacco: Never Used  . Alcohol Use: 1.2 oz/week    2 Cans of beer per week     Comment: around 2 beers daily (occasionally a third) as max since hospitalizaton 5/15.    Family History  Problem Relation Age of Onset  . Heart disease Father 25    Died suddenly.  No diagnosis  . Stroke Sister   . Dementia Mother   . Diabetes Mother   . Cancer Daughter     ovarian (?) vs other male cancer; s/p hyst doing well  . Diabetes Daughter     GDM, and now AODM  . Heart disease Son  congestive heart failure--improved   No Known Allergies  OBJECTIVE: Blood pressure 113/71, pulse 79, temperature 97.5 F (36.4 C), temperature source Oral, resp. rate 30, height 5\' 11"  (1.803 m), weight 173 lb 15.1 oz (78.9 kg), SpO2 100 %. General: He is very weak but is able to answer questions appropriately Skin: Fading maculopapular rash on thighs. No splinter or conjunctival hemorrhages Lungs: Clear anteriorly (of note large amounts of sputum were suctioned when he was intubated yesterday) Cor: Regular S1 and S2 with a 1/6 systolic murmur at the left upper sternal border Abdomen: Soft and nontender Joints and extremities: Normal  Lab Results Lab Results  Component Value Date   WBC 12.6* 11/29/2014   HGB 9.9* 11/29/2014   HCT 30.1* 11/29/2014   MCV 96.5 11/29/2014   PLT 112* 11/29/2014    Lab Results  Component Value Date   CREATININE 1.01 11/29/2014    BUN 28* 11/29/2014   NA 137 11/29/2014   K 3.9 11/29/2014   CL 107 11/29/2014   CO2 22 11/29/2014    Lab Results  Component Value Date   ALT 31 11/28/2014   AST 56* 11/28/2014   ALKPHOS 44 11/28/2014   BILITOT 1.1 11/28/2014     Microbiology: Recent Results (from the past 240 hour(s))  Urine culture     Status: None   Collection Time: 11/25/14  3:33 PM  Result Value Ref Range Status   Colony Count >=100,000 COLONIES/ML  Final   Organism ID, Bacteria Multiple bacterial morphotypes present, none  Final   Organism ID, Bacteria predominant. Suggest appropriate recollection if   Final   Organism ID, Bacteria clinically indicated.  Final  Culture, blood (single)     Status: None   Collection Time: 11/25/14  4:00 PM  Result Value Ref Range Status   Culture STAPHYLOCOCCUS SPECIES (COAGULASE NEGATIVE)  Final   Organism ID, Bacteria STAPHYLOCOCCUS SPECIES (COAGULASE NEGATIVE)  Final    Comment: The significance of isolating this organism from a single venipuncture cannot be predicted without further clinical and culture correlation Susceptibilities available only on request. Gram Stain Report Called to,Read Back By and Verified With: [place] 11/26/14 1520 BY SMITHERSJ   Culture, blood (routine x 2)     Status: None (Preliminary result)   Collection Time: 11/27/14  6:31 PM  Result Value Ref Range Status   Specimen Description BLOOD LEFT FOREARM  Final   Special Requests BOTTLES DRAWN AEROBIC AND ANAEROBIC 5CC  Final   Culture NO GROWTH < 24 HOURS  Final   Report Status PENDING  Incomplete  Urine culture     Status: None   Collection Time: 11/27/14  7:44 PM  Result Value Ref Range Status   Specimen Description URINE, RANDOM  Final   Special Requests NONE  Final   Culture NO GROWTH 2 DAYS  Final   Report Status 11/29/2014 FINAL  Final  Culture, blood (routine x 2)     Status: None (Preliminary result)   Collection Time: 11/27/14  8:00 PM  Result Value Ref Range Status    Specimen Description BLOOD RIGHT FOREARM  Final   Special Requests BOTTLES DRAWN AEROBIC AND ANAEROBIC 5ML  Final   Culture NO GROWTH < 24 HOURS  Final   Report Status PENDING  Incomplete  CSF culture with Stat gram stain     Status: None (Preliminary result)   Collection Time: 11/28/14  1:10 PM  Result Value Ref Range Status   Specimen Description CSF  Final   Special  Requests TUBE 2  Final   Gram Stain   Final    CYTOSPIN SMEAR WBC PRESENT,BOTH PMN AND MONONUCLEAR NO ORGANISMS SEEN    Culture NO GROWTH < 24 HOURS  Final   Report Status PENDING  Incomplete  MRSA PCR Screening     Status: None   Collection Time: 11/28/14  6:56 PM  Result Value Ref Range Status   MRSA by PCR NEGATIVE NEGATIVE Final    Comment:        The GeneXpert MRSA Assay (FDA approved for NASAL specimens only), is one component of a comprehensive MRSA colonization surveillance program. It is not intended to diagnose MRSA infection nor to guide or monitor treatment for MRSA infections.     Assessment: The cause of his fever remains uncertain. I would certainly continue empiric doxycycline for possible [facility] spotted fever given the high temperatures, thrombocytopenia and rash. He had one set of blood cultures drawn when he saw his primary care physician, Dr. Tomi Bamberger, on 11/25/2014. That blood culture is at [facility] labs. I called there and requested that they do susceptibility testing. Repeat blood cultures done here on 11/27/2014 (it appears that were drawn before he got his first dose of anabiotic, ceftriaxone, in the emergency department) are negative to date which suggest that the positive blood culture on 11/25/2014 is an insignificant contaminant. However I would certainly continue IV vancomycin for now. He does not have any symptoms to suggest pneumonia but he does have a left lower lobe density on chest x-ray and may have aspirated yesterday so continuing piperacillin tazobactam is also reasonable.  He's had a relatively small right frontal CVA I doubt that he has neurogenic fevers.  Plan: 1. Continue current antibiotics pending final blood culture results, clinical and radiographic monitoring 2. Dr. Lita Mains will follow with you this weekend  Michel Bickers, MD [facility] for [place] [phone] pager   [phone] cell 11/29/2014, 12:06 PM

## 2014-11-29 NOTE — Progress Notes (Signed)
[facility] Progress Note Patient Name: Jeffrey [name] DOB: 07-Feb-1933 MRN: [id number]  Date of Service  11/29/2014   HPI/Events of Note  RN calling  - goal temp is 99.51F. Now is 100F. Patient cannot swallow. Refused rectal tylenol  eICU Interventions  IV acetaminophen x q6h prn x 1 day   Intervention Category Intermediate Interventions: fever  Lemmie Steinhaus 11/29/2014, 7:42 PM

## 2014-11-29 NOTE — Progress Notes (Signed)
eLink Physician-Brief Progress Note Patient Name: Jeffrey [name] DOB: 1932-08-27 MRN: [id number]   Date of Service  11/29/2014  HPI/Events of Note  Hypotension. CVP = 12.   eICU Interventions  Will order: 1. Norepinephrine IV infusion. Titrate to MAP > 65.     Intervention Category Major Interventions: Hypotension - evaluation and management;Sepsis - evaluation and management  Sommer,Steven Eugene 11/29/2014, 2:50 AM

## 2014-11-29 NOTE — Procedures (Signed)
ELECTROENCEPHALOGRAM REPORT  Date of Study: 11/29/2014  Patient's Name: Jeffrey [name] MRN: [id number] Date of Birth: Oct 15, 1932  Referring Provider: Dr. Jim Like  Clinical History: This is an 81 year old man with altered mental status. Patient was extubated 2 hours prior to EEG.   Medications: acetaminophen (TYLENOL) solution 650 mg aspirin chewable tablet 81 mg doxycycline (VIBRAMYCIN) 100 mg in dextrose 5 % 250 mL IVPB famotidine (PEPCID) IVPB 20 mg premix piperacillin-tazobactam (ZOSYN) IVPB 3.375 g pravastatin (PRAVACHOL) tablet 40 mg vancomycin (VANCOCIN) IVPB 750 mg/150 ml premix  Technical Summary: A multichannel digital EEG recording measured by the international 10-20 system with electrodes applied with paste and impedances below 5000 ohms performed as portable with EKG monitoring in an awake and asleep patient.  Hyperventilation and photic stimulation were not performed.  The digital EEG was referentially recorded, reformatted, and digitally filtered in a variety of bipolar and referential montages for optimal display.   Description: The patient is awake and asleep during the recording.  During maximal wakefulness, there is a symmetric, medium voltage 7 Hz posterior dominant rhythm that attenuates with eye opening. This is admixed with a moderate amount of diffuse 4-5 Hz theta and 2-3 Hz delta slowing of the waking background.  During drowsiness and sleep, there is an increase in theta and delta slowing of the background with poorly formed vertex waves seen. Hyperventilation and photic stimulation were not performed.  There were no epileptiform discharges or electrographic seizures seen.    EKG lead was unremarkable.  Impression: This awake and asleep EEG is abnormal due to moderate diffuse slowing of the waking background.  Clinical Correlation of the above findings indicates diffuse cerebral dysfunction that is non-specific in etiology and can be seen with  hypoxic/ischemic injury, toxic/metabolic encephalopathies, or medication effect.  The absence of epileptiform discharges does not rule out a clinical diagnosis of epilepsy.  Clinical correlation is advised.   Ellouise Newer, M.D.

## 2014-11-29 NOTE — Progress Notes (Signed)
STROKE TEAM PROGRESS NOTE   HISTORY Jeffrey [name] is an 81 y.o. male with hx of TIA, DM, prostate CA presenting with fever and altered mental status since Saturday. Family notes difficulty speaking, trouble getting his words out. They also note some possible right sided weakness. Deny any recent medication changes, no tick exposure, no sick contacts. Denies a headache. At baseline they report he is active and independent.   CT head imaging reviewed and shows no acute process. No leukocytosis on labs. Na 132.     SUBJECTIVE (INTERVAL HISTORY) His  Wife and grand daughter are at the bedside.  Overall he feels his condition is gradually improving. [name] He has no known prior history of strokes but MRI scan shows bilateral old subcortical lacunar infarcts and a new lacunar right frontal white matter infarct. He has no known history of seizures but has had episodes which were felt to be TIAs in the past which were stereotypical and consisted of transient speech difficulties and confusion per family   OBJECTIVE Temp:  [96.3 F (35.7 C)-102.6 F (39.2 C)] 97.9 F (36.6 C) (07/01 1200) Pulse Rate:  [58-116] 84 (07/01 1200) Cardiac Rhythm:  [-] Normal sinus rhythm (07/01 1200) Resp:  [14-39] 30 (07/01 1200) BP: (70-155)/(45-76) 124/71 mmHg (07/01 1200) SpO2:  [93 %-100 %] 100 % (07/01 1200) FiO2 (%):  [40 %-100 %] 40 % (07/01 0910) Weight:  [78.9 kg (173 lb 15.1 oz)] 78.9 kg (173 lb 15.1 oz) (06/30 1853)   Recent Labs Lab 11/28/14 2051 11/28/14 2327 11/29/14 0334 11/29/14 0738 11/29/14 1129  GLUCAP 132* 161* 114* 123* 99    Recent Labs Lab 11/27/14 1829 11/28/14 0020 11/28/14 2118 11/29/14 0433  NA 132* 132* 135 137  K 4.1 3.6 4.1 3.9  CL 97* 97* 104 107  CO2 22 24 22 22   GLUCOSE 167* 180* 164* 128*  BUN 31* 26* 24* 28*  CREATININE 1.07 1.05 1.06 1.01  CALCIUM 8.2* 7.6* 7.7* 7.6*    Recent Labs Lab 11/27/14 1829 11/28/14 0020 11/28/14 2118  AST 84* 64* 56*  ALT 48 40 31   ALKPHOS 53 49 44  BILITOT 1.0 0.7 1.1  PROT 6.7 5.6* 5.5*  ALBUMIN 3.2* 2.6* 2.3*    Recent Labs Lab 11/25/14 1600 11/27/14 1829 11/28/14 0020 11/28/14 2118 11/29/14 0433  WBC 5.3 7.8 7.5 8.9 12.6*  NEUTROABS 4.8 6.7  --   --   --   HGB 13.3 13.0 11.1* 10.7* 9.9*  HCT 39.8 37.8* 33.1* 31.5* 30.1*  MCV 95.9 96.2 95.9 95.2 96.5  PLT 137* 94* 83* 82* 112*    Recent Labs Lab 11/28/14 1708 11/28/14 2118 11/29/14 0433  TROPONINI 0.05* 0.10* 0.07*    Recent Labs  11/27/14 2000  LABPROT 13.7  INR 1.03    Recent Labs  11/27/14 1944  COLORURINE AMBER*  LABSPEC 1.027  PHURINE 5.5  GLUCOSEU NEGATIVE  HGBUR MODERATE*  BILIRUBINUR SMALL*  KETONESUR 15*  PROTEINUR 100*  UROBILINOGEN 0.2  NITRITE NEGATIVE  LEUKOCYTESUR NEGATIVE       Component Value Date/Time   CHOL 166 04/10/2014 0925   TRIG 169* 04/10/2014 0925   HDL 53 04/10/2014 0925   CHOLHDL 3.1 04/10/2014 0925   VLDL 34 04/10/2014 0925   LDLCALC 79 04/10/2014 0925   Lab Results  Component Value Date   HGBA1C 5.8 10/17/2014   No results found for: LABOPIA, COCAINSCRNUR, LABBENZ, AMPHETMU, THCU, LABBARB  No results for input(s): ETH in the last 168 hours.  Imaging   Ct Head Wo Contrast 11/27/2014    No acute intracranial abnormality.  Stable pattern of cerebral cortical atrophy and moderately advanced chronic microvascular ischemic white matter disease.    Mr Brain Wo Contrast  11/28/2014    1. Acute nonhemorrhagic white matter infarct in the anterior right frontal lobe.  2. Moderate atrophy and diffuse white matter changes are again seen bilaterally, compatible with chronic microvascular ischemia.  3. Remote lacunar infarcts of the anterior left coronal radiata.  4. Thinning of the corpus callosum compatible with the degree of generalized atrophy.       Portable Chest Xray 11/28/2014    Tubes and lines as described above in satisfactory position.  Left perihilar atelectatic change.  Aortic  atherosclerotic change.      Dg Chest Port 1 View 11/27/2014   No acute abnormality noted.  Aortic atherosclerosis.   Dg Abd Portable 1v  11/28/2014    OG tube tip in body of stomach.     PHYSICAL EXAM Elderly frail caucasian male who is intubated. . Afebrile. Head is nontraumatic. Neck is supple without bruit.    Cardiac exam no murmur or gallop. Lungs are clear to auscultation. Distal pulses are well felt. Neurological Exam :  Intubated but not sedated. Awake alert follows commands well. Extraocular movements appear full range. Blinks to threat bilaterally. Fundi were not visualized. Face is symmetric without weakness. Tongue is midline. Motor system exam able to lift both upper extremities against gravity but left more than right. No scleral extremities well against gravity as well. No obvious focal weakness. Deep tendon pulses are symmetric. Sensation appears intact bilaterally. Plantars are downgoing.   ASSESSMENT/PLAN [name] is a 81 y.o. male with history of TIA, diabetes mellitus, and prostate cancer presenting with fever and altered mental status with possible right-sided weakness.[name] He did not receive IV t-PA due to late presentation.  Stroke:  Non-dominant infarct right frontal lobe   Resultant   No focal deficits  MRI - acute nonhemorrhagic white matter infarct in the anterior right frontal lobe.   MRA  not performed  Carotid Doppler Preliminary findings: Right = 1-39% ICA stenosis. Left = 80-99% ICA stenosis.  2D Echo  EF 40-45%. No cardiac source of embolism identified.  LDL pending  HgbA1c pending  Ammonia level - WNL  CSF - total protein 111 (H)  SCDs for VTE prophylaxis    aspirin 81 mg orally every day prior to admission, now on aspirin 81 mg orally every day  Ongoing aggressive stroke risk factor management  Therapy recommendations: Pending  Disposition: Pending  Hypertension  Home meds: Norvasc and Toprol-XL  Blood pressure somewhat  low at times - blood pressure medications on hold    Hyperlipidemia  Home meds:   Mevacor 40 mg daily resumed in hospital  LDL pending, goal < 70  Continue statin at discharge  Diabetes  HgbA1c pending, goal < 7.0  Controlled    Other Stroke Risk Factors  Advanced age  Cigarette smoker, quit smoking 34 years ago.  ETOH use  Hx stroke/TIA  Family hx stroke (sister)    Other Active Problems  Altered mental status  Anemia with thrombocytopenia and leukocytosis  Fever / Infection - vancomycin and doxycycline  Mild hypotension  Other Pertinent History  Essential tremor  Asymptomatic left ICA stenosis  Hospital day # 2  I have personally examined this patient, reviewed notes, independently viewed imaging studies, participated in medical decision making and plan of care.  I have made any additions or clarifications directly to the above note. Agree with note above. He presented with 3 day history of fever followed by confusion and disorientation and speech difficulties without focal weakness and MRI scan shows likely incidental tiny right frontal lacunar infarct and spinal fluid is unremarkable except for elevated protein. He had respiratory failure but seems to be improving. Recommend checking an EEG for seizures. Check echocardiogram and Doppler studies. May need to consider trial of anti-convulsants given prior episodes of transient speech difficulties in the past . Carotid Dopplers show asymptomatic high-grade left ICA stenosis which can be evaluated as an outpatient for revascularization Long discussion with patient, wife and granddaughter at the bedside and answered questions This patient is critically ill and at significant risk of neurological worsening, death and care requires constant monitoring of vital signs, hemodynamics,respiratory and cardiac monitoring, extensive review of multiple databases, frequent neurological assessment, discussion with family, other  specialists and medical decision making of high complexity.I have made any additions or clarifications directly to the above note.This critical care time does not reflect procedure time, or teaching time or supervisory time of PA/NP/Med Resident etc but could involve care discussion time.  I spent 30 minutes of neurocritical care time  in the care of  this patient.   Antony Contras, MD Medical Director [place] Pager: [phone] 11/29/2014 1:30 PM     To contact Stroke Continuity provider, please refer to [email or web address] After hours, contact General Neurology

## 2014-11-29 NOTE — Procedures (Signed)
Extubation Procedure Note  Patient Details:   Name: Jeffrey [name] DOB: 11-05-1932 MRN: [id number]   Airway Documentation:     Evaluation  O2 sats: stable throughout Complications: No apparent complications Patient did tolerate procedure well. Bilateral Breath Sounds: Clear, Rhonchi Suctioning: Airway Yes  Patient tolerated wean. MD ordered to extubate. Positive for cuff leak. Patient extubated to a 4 Lpm nasal cannula. No signs of dyspnea or stridor. Patient resting comfortably. RN at bedside.   Myrtie Neither 11/29/2014, 9:43 AM

## 2014-11-29 NOTE — Progress Notes (Signed)
CCM MD and NP at bedside. Intubated and central line placed. Consents signed prior to procedures. 100 mcg of fentanyl, 2 mg of versed, 20 mg of etomidate, 100 mg of rocuronium. Placement confirmed by xray.

## 2014-11-29 NOTE — Progress Notes (Signed)
ANTIBIOTIC CONSULT NOTE -   Pharmacy Consult for Zosyn Indication: rule out sepsis, aspiration PNA  No Known Allergies  Patient Measurements: Height: 5\' 11"  (180.3 cm) Weight: 173 lb 15.1 oz (78.9 kg) IBW/kg (Calculated) : 75.3 Adjusted Body Weight:   Vital Signs: Temp: 96.3 F (35.7 C) (07/01 0935) Temp Source: Core (Comment) (07/01 0800) BP: 103/56 mmHg (07/01 0900) Pulse Rate: 73 (07/01 0935) Intake/Output from previous day: 06/30 0701 - 07/01 0700 In: 2424.4 [P.O.:120; I.V.:754.4; IV Piggyback:1550] Out: 476 [Urine:316; Emesis/NG output:160] Intake/Output from this shift: Total I/O In: 18.2 [I.V.:18.2] Out: 155 [Urine:155]  Labs:  Recent Labs  11/28/14 0020 11/28/14 2118 11/29/14 0433  WBC 7.5 8.9 12.6*  HGB 11.1* 10.7* 9.9*  PLT 83* 82* 112*  CREATININE 1.05 1.06 1.01   Estimated Creatinine Clearance: 62.1 mL/min (by C-G formula based on Cr of 1.01). No results for input(s): VANCOTROUGH, VANCOPEAK, VANCORANDOM, GENTTROUGH, GENTPEAK, GENTRANDOM, TOBRATROUGH, TOBRAPEAK, TOBRARND, AMIKACINPEAK, AMIKACINTROU, AMIKACIN in the last 72 hours.   Microbiology: Recent Results (from the past 720 hour(s))  Urine culture     Status: None   Collection Time: 11/25/14  3:33 PM  Result Value Ref Range Status   Colony Count >=100,000 COLONIES/ML  Final   Organism ID, Bacteria Multiple bacterial morphotypes present, none  Final   Organism ID, Bacteria predominant. Suggest appropriate recollection if   Final   Organism ID, Bacteria clinically indicated.  Final  Culture, blood (single)     Status: None   Collection Time: 11/25/14  4:00 PM  Result Value Ref Range Status   Culture STAPHYLOCOCCUS SPECIES (COAGULASE NEGATIVE)  Final   Organism ID, Bacteria STAPHYLOCOCCUS SPECIES (COAGULASE NEGATIVE)  Final    Comment: The significance of isolating this organism from a single venipuncture cannot be predicted without further clinical and culture correlation Susceptibilities  available only on request. Gram Stain Report Called to,Read Back By and Verified With: [facility] SCALES 11/26/14 1520 BY SMITHERSJ   Culture, blood (routine x 2)     Status: None (Preliminary result)   Collection Time: 11/27/14  6:31 PM  Result Value Ref Range Status   Specimen Description BLOOD LEFT FOREARM  Final   Special Requests BOTTLES DRAWN AEROBIC AND ANAEROBIC 5CC  Final   Culture NO GROWTH < 24 HOURS  Final   Report Status PENDING  Incomplete  Urine culture     Status: None (Preliminary result)   Collection Time: 11/27/14  7:44 PM  Result Value Ref Range Status   Specimen Description URINE, RANDOM  Final   Special Requests NONE  Final   Culture NO GROWTH < 24 HOURS  Final   Report Status PENDING  Incomplete  Culture, blood (routine x 2)     Status: None (Preliminary result)   Collection Time: 11/27/14  8:00 PM  Result Value Ref Range Status   Specimen Description BLOOD RIGHT FOREARM  Final   Special Requests BOTTLES DRAWN AEROBIC AND ANAEROBIC 5ML  Final   Culture NO GROWTH < 24 HOURS  Final   Report Status PENDING  Incomplete  CSF culture with Stat gram stain     Status: None (Preliminary result)   Collection Time: 11/28/14  1:10 PM  Result Value Ref Range Status   Specimen Description CSF  Final   Special Requests TUBE 2  Final   Gram Stain   Final    CYTOSPIN SMEAR WBC PRESENT,BOTH PMN AND MONONUCLEAR NO ORGANISMS SEEN    Culture PENDING  Incomplete   Report Status PENDING  Incomplete  MRSA PCR Screening     Status: None   Collection Time: 11/28/14  6:56 PM  Result Value Ref Range Status   MRSA by PCR NEGATIVE NEGATIVE Final    Comment:        The GeneXpert MRSA Assay (FDA approved for NASAL specimens only), is one component of a comprehensive MRSA colonization surveillance program. It is not intended to diagnose MRSA infection nor to guide or monitor treatment for MRSA infections.     Medical History: Past Medical History  Diagnosis Date  .  Hypertension   . Hypercholesteremia   . Impaired fasting glucose   . Prostate cancer radiation + seeding implant (Dr.[name])  . Rotator cuff tear, right 10/2010    supraspinatous and infraspinatous  . Diabetes mellitus   . Macular degeneration   . Heart murmur     mild aortic stenosis  . Radiation proctitis 11/2012    treated with APC ablation and Canasa suppositories (Dr. Hilarie Fredrickson)  . TIA (transient ischemic attack) 10/02/13    hosp at [facility] x 1 night  . 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 Aortic atherosclerosis 11/25/2014  . Acute encephalopathy 11/27/2014    Medications:  Prescriptions prior to admission  Medication Sig Dispense Refill Last Dose  . Acetaminophen (TYLENOL ARTHRITIS PAIN PO) Take 2 tablets by mouth 2 (two) times daily.     11/27/2014 at Unknown time  . amLODipine (NORVASC) 2.5 MG tablet TAKE 1 TABLET (2.5 MG TOTAL) BY MOUTH DAILY. 90 tablet 1 11/27/2014 at Unknown time  . aspirin 81 MG tablet Take 81 mg by mouth 2 (two) times daily.    11/27/2014 at Unknown time  . doxylamine, Sleep, (EQ SLEEP AID) 25 MG tablet Take 25 mg by mouth at bedtime as needed for sleep.   Past Week at Unknown time  . hydrocortisone cream 1 % Apply 1 application topically as needed (for ankles).   Past Week at Unknown time  . lovastatin (MEVACOR) 20 MG tablet Take 2 tablets (40 mg total) by mouth at bedtime. 180 tablet 1 11/26/2014 at Unknown time  . metoprolol succinate (TOPROL-XL) 50 MG 24 hr tablet TAKE 1 TABLET BY MOUTH TWICE A DAY TAKE WITH OR IMMEDIATELY FOLLOWING A MEAL 180 tablet 0 11/26/2014 at 2000  . Multiple Vitamins-Minerals (PRESERVISION AREDS) CAPS Take 1 capsule by mouth every morning.   11/27/2014 at Unknown time  . naproxen sodium (ANAPROX) 220 MG tablet Take 220 mg by mouth 2 (two) times daily as needed (for pain).   11/27/2014 at Unknown time   Scheduled:  . antiseptic oral rinse  7 mL Mouth Rinse QID  . aspirin  81 mg Per Tube Daily  .  chlorhexidine  15 mL Mouth Rinse BID  . doxycycline (VIBRAMYCIN) IV  100 mg Intravenous Q12H  . famotidine (PEPCID) IV  20 mg Intravenous Q12H  . insulin aspart  0-15 Units Subcutaneous 6 times per day  . pravastatin  40 mg Per Tube q1800  . sodium chloride  3 mL Intravenous Q12H  . vancomycin  750 mg Intravenous Q12H   Infusions:  . dexmedetomidine 0.4 mcg/kg/hr (11/29/14 0800)   Assessment: 81yo male presents with fever and AMS. Pharmacy is consulted to dose vancomycin for suspected sepsis. Pt was originally started on ceftriaxone for what was thought to be a UTI. Will broaden coverage with initial dose of zosyn as well. Pt is febrile to 102.1, WBC wnl, sCr 1.1, LA 2.2.  Patient was intubated  yesterday afternoon, now extubated.   Goal of Therapy:  Vancomycin trough level 15-20 mcg/ml  Plan:  Resume Vancomycin 750 mg IV q 12h D/C unasyn Initiate Zosyn 3.375 mg IV q8h F/u culture results and plan of care.  Levester Fresh, PharmD, BCPS Clinical Pharmacist Pager [phone] 11/29/2014 10:01 AM

## 2014-11-29 NOTE — Progress Notes (Signed)
0230 called Elink to notify MD of SBP in 70's-80's. 500 cc bolus given. CVP 12-13. Low urine output. Orders given to start Levo drip. Notified pharmacy.

## 2014-11-30 ENCOUNTER — Encounter: Payer: Self-pay | Admitting: Family Medicine

## 2014-11-30 DIAGNOSIS — I6522 Occlusion and stenosis of left carotid artery: Secondary | ICD-10-CM | POA: Insufficient documentation

## 2014-11-30 DIAGNOSIS — A770 Spotted fever due to Rickettsia rickettsii: Secondary | ICD-10-CM | POA: Insufficient documentation

## 2014-11-30 LAB — BASIC METABOLIC PANEL
Anion gap: 9 (ref 5–15)
BUN: 23 mg/dL — ABNORMAL HIGH (ref 6–20)
CO2: 22 mmol/L (ref 22–32)
CREATININE: 0.89 mg/dL (ref 0.61–1.24)
Calcium: 7.9 mg/dL — ABNORMAL LOW (ref 8.9–10.3)
Chloride: 105 mmol/L (ref 101–111)
Glucose, Bld: 126 mg/dL — ABNORMAL HIGH (ref 65–99)
POTASSIUM: 3.6 mmol/L (ref 3.5–5.1)
Sodium: 136 mmol/L (ref 135–145)

## 2014-11-30 LAB — CULTURE, BLOOD (SINGLE)

## 2014-11-30 LAB — CBC
HEMATOCRIT: 30.1 % — AB (ref 39.0–52.0)
HEMOGLOBIN: 10 g/dL — AB (ref 13.0–17.0)
MCH: 31.9 pg (ref 26.0–34.0)
MCHC: 33.2 g/dL (ref 30.0–36.0)
MCV: 96.2 fL (ref 78.0–100.0)
Platelets: 146 10*3/uL — ABNORMAL LOW (ref 150–400)
RBC: 3.13 MIL/uL — ABNORMAL LOW (ref 4.22–5.81)
RDW: 13.8 % (ref 11.5–15.5)
WBC: 14.9 10*3/uL — ABNORMAL HIGH (ref 4.0–10.5)

## 2014-11-30 LAB — GLUCOSE, CAPILLARY
GLUCOSE-CAPILLARY: 107 mg/dL — AB (ref 65–99)
Glucose-Capillary: 126 mg/dL — ABNORMAL HIGH (ref 65–99)
Glucose-Capillary: 152 mg/dL — ABNORMAL HIGH (ref 65–99)
Glucose-Capillary: 154 mg/dL — ABNORMAL HIGH (ref 65–99)
Glucose-Capillary: 169 mg/dL — ABNORMAL HIGH (ref 65–99)

## 2014-11-30 LAB — LIPID PANEL
Cholesterol: 104 mg/dL (ref 0–200)
HDL: 11 mg/dL — ABNORMAL LOW (ref >40)
LDL Cholesterol: 40 mg/dL (ref 0–99)
Total CHOL/HDL Ratio: 9.5 RATIO
Triglycerides: 266 mg/dL — ABNORMAL HIGH (ref <150)
VLDL: 53 mg/dL — ABNORMAL HIGH (ref 0–40)

## 2014-11-30 LAB — PHOSPHORUS: PHOSPHORUS: 3.3 mg/dL (ref 2.5–4.6)

## 2014-11-30 LAB — HEMOGLOBIN A1C
HEMOGLOBIN A1C: 6.3 % — AB (ref 4.8–5.6)
Mean Plasma Glucose: 134 mg/dL

## 2014-11-30 LAB — MAGNESIUM: Magnesium: 2 mg/dL (ref 1.7–2.4)

## 2014-11-30 MED ORDER — PRAVASTATIN SODIUM 40 MG PO TABS
40.0000 mg | ORAL_TABLET | Freq: Every day | ORAL | Status: DC
  Administered 2014-11-30 – 2014-12-03 (×4): 40 mg via ORAL
  Filled 2014-11-30 (×4): qty 1

## 2014-11-30 MED ORDER — ASPIRIN 81 MG PO CHEW
81.0000 mg | CHEWABLE_TABLET | Freq: Every day | ORAL | Status: DC
  Administered 2014-12-01 – 2014-12-03 (×3): 81 mg via ORAL
  Filled 2014-11-30 (×5): qty 1

## 2014-11-30 NOTE — Progress Notes (Signed)
INFECTIOUS DISEASE PROGRESS NOTE  ID: Jeffrey [name] is a 81 y.o. male with  Principal Problem:   Fever Active Problems:   Essential hypertension, benign   Pure hypercholesterolemia   Prostate cancer   Type 2 diabetes mellitus without complication   Murmur   TIA (transient ischemic attack)   Aortic atherosclerosis   Sepsis   Acute encephalopathy   Thrombocytopenia   AKI (acute kidney injury)   Severe sepsis   Respiratory distress   Pyrexia   Lacunar infarct, acute  Subjective: Without complaints  Abtx:  Anti-infectives    Start     Dose/Rate Route Frequency Ordered Stop   11/29/14 1030  piperacillin-tazobactam (ZOSYN) IVPB 3.375 g     3.375 g 12.5 mL/hr over 240 Minutes Intravenous Every 8 hours 11/29/14 1001     11/29/14 1000  doxycycline (VIBRAMYCIN) 100 mg in dextrose 5 % 250 mL IVPB     100 mg 125 mL/hr over 120 Minutes Intravenous Every 12 hours 11/29/14 0003     11/28/14 2045  ampicillin-sulbactam (UNASYN) 1.5 g in sodium chloride 0.9 % 50 mL IVPB  Status:  Discontinued     1.5 g 100 mL/hr over 30 Minutes Intravenous Every 6 hours 11/28/14 2030 11/29/14 0949   11/28/14 2030  vancomycin (VANCOCIN) IVPB 750 mg/150 ml premix     750 mg 150 mL/hr over 60 Minutes Intravenous Every 12 hours 11/28/14 2028     11/28/14 0500  piperacillin-tazobactam (ZOSYN) IVPB 3.375 g  Status:  Discontinued     3.375 g 12.5 mL/hr over 240 Minutes Intravenous Every 8 hours 11/27/14 2141 11/28/14 1234   11/27/14 2200  doxycycline (VIBRAMYCIN) 200 mg in dextrose 5 % 250 mL IVPB  Status:  Discontinued     200 mg 125 mL/hr over 120 Minutes Intravenous Every 12 hours 11/27/14 2141 11/29/14 0003   11/27/14 2100  vancomycin (VANCOCIN) IVPB 750 mg/150 ml premix  Status:  Discontinued     750 mg 150 mL/hr over 60 Minutes Intravenous Every 12 hours 11/27/14 2049 11/28/14 1234   11/27/14 2045  piperacillin-tazobactam (ZOSYN) IVPB 3.375 g     3.375 g 12.5 mL/hr over 240 Minutes Intravenous   Once 11/27/14 2044 11/28/14 0058   11/27/14 1930  cefTRIAXone (ROCEPHIN) 2 g in dextrose 5 % 50 mL IVPB  Status:  Discontinued     2 g 100 mL/hr over 30 Minutes Intravenous  Once 11/27/14 1917 11/27/14 1922   11/27/14 1930  cefTRIAXone (ROCEPHIN) 2 g in dextrose 5 % 50 mL IVPB  Status:  Discontinued     2 g 100 mL/hr over 30 Minutes Intravenous Every 24 hours 11/27/14 1922 11/27/14 2048      Medications:  Scheduled: . antiseptic oral rinse  7 mL Mouth Rinse QID  . [START ON 12/01/2014] aspirin  81 mg Oral Daily  . chlorhexidine  15 mL Mouth Rinse BID  . doxycycline (VIBRAMYCIN) IV  100 mg Intravenous Q12H  . piperacillin-tazobactam (ZOSYN)  IV  3.375 g Intravenous Q8H  . pravastatin  40 mg Oral q1800  . sodium chloride  3 mL Intravenous Q12H  . vancomycin  750 mg Intravenous Q12H    Objective: Vital signs in last 24 hours: Temp:  [96.6 F (35.9 C)-101.1 F (38.4 C)] 99.3 F (37.4 C) (07/02 1100) Pulse Rate:  [84-129] 96 (07/02 1100) Resp:  [23-42] 38 (07/02 1100) BP: (115-149)/(60-91) 138/88 mmHg (07/02 1100) SpO2:  [92 %-100 %] 98 % (07/02 1100)   General appearance:  alert, cooperative and no distress Resp: clear to auscultation bilaterally Cardio: regular rate and rhythm GI: normal findings: bowel sounds normal and soft, non-tender  Lab Results  Recent Labs  11/29/14 0433 11/30/14 0345  WBC 12.6* 14.9*  HGB 9.9* 10.0*  HCT 30.1* 30.1*  NA 137 136  K 3.9 3.6  CL 107 105  CO2 22 22  BUN 28* 23*  CREATININE 1.01 0.89   Liver Panel  Recent Labs  11/28/14 0020 11/28/14 2118  PROT 5.6* 5.5*  ALBUMIN 2.6* 2.3*  AST 64* 56*  ALT 40 31  ALKPHOS 49 44  BILITOT 0.7 1.1   Sedimentation Rate No results for input(s): ESRSEDRATE in the last 72 hours. C-Reactive Protein No results for input(s): CRP in the last 72 hours.  Microbiology: Recent Results (from the past 240 hour(s))  Urine culture     Status: None   Collection Time: 11/25/14  3:33 PM  Result  Value Ref Range Status   Colony Count >=100,000 COLONIES/ML  Final   Organism ID, Bacteria Multiple bacterial morphotypes present, none  Final   Organism ID, Bacteria predominant. Suggest appropriate recollection if   Final   Organism ID, Bacteria clinically indicated.  Final  Culture, blood (single)     Status: None   Collection Time: 11/25/14  4:00 PM  Result Value Ref Range Status   Culture STAPHYLOCOCCUS SPECIES (COAGULASE NEGATIVE)  Final   Organism ID, Bacteria STAPHYLOCOCCUS SPECIES (COAGULASE NEGATIVE)  Final    Comment: The significance of isolating this organism from a single venipuncture cannot be predicted without further clinical and culture correlation Susceptibilities available only on request. Report Faxed by Request Gram Stain Report Called to,Read Back By and Verified With: [facility] SCALES 11/26/14 1520 BY SMITHERSJ       Susceptibility   Staphylococcus species (coagulase negative) -  (no method available)    PENICILLIN >=0.5 Resistant     OXACILLIN <=0.25 Sensitive     CEFAZOLIN  Sensitive     GENTAMICIN <=0.5 Sensitive     CIPROFLOXACIN <=0.5 Sensitive     LEVOFLOXACIN 0.25 Sensitive     TRIMETH/SULFA <=10 Sensitive     VANCOMYCIN <=0.5 Sensitive     CLINDAMYCIN <=0.25 Sensitive     ERYTHROMYCIN <=0.25 Sensitive     RIFAMPIN <=0.5 Sensitive     TETRACYCLINE <=1 Sensitive   Culture, blood (routine x 2)     Status: None (Preliminary result)   Collection Time: 11/27/14  6:31 PM  Result Value Ref Range Status   Specimen Description BLOOD LEFT FOREARM  Final   Special Requests BOTTLES DRAWN AEROBIC AND ANAEROBIC 5CC  Final   Culture NO GROWTH 3 DAYS  Final   Report Status PENDING  Incomplete  Urine culture     Status: None   Collection Time: 11/27/14  7:44 PM  Result Value Ref Range Status   Specimen Description URINE, RANDOM  Final   Special Requests NONE  Final   Culture NO GROWTH 2 DAYS  Final   Report Status 11/29/2014 FINAL  Final  Culture, blood  (routine x 2)     Status: None (Preliminary result)   Collection Time: 11/27/14  8:00 PM  Result Value Ref Range Status   Specimen Description BLOOD RIGHT FOREARM  Final   Special Requests BOTTLES DRAWN AEROBIC AND ANAEROBIC 5ML  Final   Culture NO GROWTH 3 DAYS  Final   Report Status PENDING  Incomplete  CSF culture with Stat gram stain     Status:  None (Preliminary result)   Collection Time: 11/28/14  1:10 PM  Result Value Ref Range Status   Specimen Description CSF  Final   Special Requests TUBE 2  Final   Gram Stain   Final    CYTOSPIN SMEAR WBC PRESENT,BOTH PMN AND MONONUCLEAR NO ORGANISMS SEEN    Culture NO GROWTH 2 DAYS  Final   Report Status PENDING  Incomplete  MRSA PCR Screening     Status: None   Collection Time: 11/28/14  6:56 PM  Result Value Ref Range Status   MRSA by PCR NEGATIVE NEGATIVE Final    Comment:        The GeneXpert MRSA Assay (FDA approved for NASAL specimens only), is one component of a comprehensive MRSA colonization surveillance program. It is not intended to diagnose MRSA infection nor to guide or monitor treatment for MRSA infections.   Culture, respiratory (NON-Expectorated)     Status: None (Preliminary result)   Collection Time: 11/28/14  8:16 PM  Result Value Ref Range Status   Specimen Description TRACHEAL ASPIRATE  Final   Special Requests NONE  Final   Gram Stain PENDING  Incomplete   Culture   Final    Culture reincubated for better growth Performed at [facility]    Report Status PENDING  Incomplete    Studies/Results: Mr Brain Wo Contrast  11/28/2014   CLINICAL DATA:  Fever of unknown origin. Sepsis. Right-sided weakness. Difficulty speaking. Lumbar puncture immediately preceded the MRI scan.  EXAM: MRI HEAD WITHOUT CONTRAST  TECHNIQUE: Multiplanar, multiecho pulse sequences of the brain and surrounding structures were obtained without intravenous contrast.  COMPARISON:  CT head without contrast 11/27/2014  FINDINGS:  An acute 1.5 cm linear white matter infarct is present in the anterior right frontal lobe.  No other acute infarcts are present. Remote lacunar infarcts are present in the anterior coronal radiata, left greater than right. Moderate periventricular T2 changes are present bilaterally.  Flow is present in the major intracranial arteries. The globes and orbits are intact. The paranasal sinuses and mastoid air cells are clear.  There is thinning of the corpus callosum. Midline structures are otherwise within normal limits.  IMPRESSION: 1. Acute nonhemorrhagic white matter infarct in the anterior right frontal lobe. 2. Moderate atrophy and diffuse white matter changes are again seen bilaterally, compatible with chronic microvascular ischemia. 3. Remote lacunar infarcts of the anterior left coronal radiata. 4. Thinning of the corpus callosum compatible with the degree of generalized atrophy.   Electronically Signed   By: San Morelle M.D.   On: 11/28/2014 15:50   Portable Chest Xray  11/29/2014   CLINICAL DATA:  Respiratory DIS breast, sepsis, acute renal injury, acute encephalopathy.  EXAM: PORTABLE CHEST - 1 VIEW  COMPARISON:  Portable chest x-ray of November 28, 2014  FINDINGS: The lungs are adequately inflated. There is medial left lower lobe density. The cardiac silhouette is normal in size. The pulmonary vascularity is not engorged. The endotracheal tube tip lies approximately 4.3 cm above the carina. The esophagogastric tube tip projects below the inferior margin of the image. The left internal jugular venous catheter tip projects over the distal third of the SVC.  IMPRESSION: Medial left lower lobe subsegmental atelectasis. There is no CHF. The support tubes are in reasonable position.   Electronically Signed   By: David  Martinique M.D.   On: 11/29/2014 08:04   Portable Chest Xray  11/28/2014   CLINICAL DATA:  Check endotracheal tube placement  EXAM: PORTABLE  CHEST - 1 VIEW  COMPARISON:  11/27/2014  FINDINGS:  Endotracheal tube is now seen 4.6 cm above the carina. A nasogastric catheter is noted extending into the stomach. Left central venous line is noted with the tip at the cavoatrial junction. The lungs are well aerated. Some patchy changes are noted in the left perihilar region which may represent some early atelectasis. The cardiac shadow is within normal limits. Mild aortic calcifications are seen. No bony abnormality is noted.  IMPRESSION: Tubes and lines as described above in satisfactory position.  Left perihilar atelectatic change.  Aortic atherosclerotic change.   Electronically Signed   By: Inez Catalina M.D.   On: 11/28/2014 20:37   Dg Abd Portable 1v  11/28/2014   CLINICAL DATA:  Evaluate OG tube placement  EXAM: PORTABLE ABDOMEN - 1 VIEW  COMPARISON:  None.  FINDINGS: The OG tube tip is in the body of the stomach. Side port is well positioned below the GE junction. Bowel gas pattern is unremarkable  IMPRESSION: 1. OG tube tip in body of stomach.   Electronically Signed   By: Kerby Moors M.D.   On: 11/28/2014 20:53     Assessment/Plan: CVA RMSF, fever rash VDRF, ? Aspiration BCx 1/2 MSSE  Total days of antibiotics: 4 doxy, vanco/zosyn 2  He appears to be doing well Agree with move to regular floor.  Continue doxy Agree with stopping vanco/zosyn provided a repeat CXR looks good.          Bobby Rumpf Infectious Diseases (pager) [phone] [email or web address] 11/30/2014, 12:38 PM  LOS: 3 days

## 2014-11-30 NOTE — Progress Notes (Signed)
STROKE TEAM PROGRESS NOTE   HISTORY Jeffrey [name] is an 81 y.o. male with hx of TIA, DM, prostate CA presenting with fever and altered mental status since Saturday. Family notes difficulty speaking, trouble getting his words out. They also note some possible right sided weakness. Deny any recent medication changes, no tick exposure, no sick contacts. Denies a headache. At baseline they report he is active and independent.       SUBJECTIVE (INTERVAL HISTORY) No fevers this AM. Speech has improved. On antibiotics.  Mental status improved as per family.     MRI scan shows bilateral old subcortical lacunar infarcts and a new lacunar right frontal white matter infarct.  Sodium improved.   OBJECTIVE Temp:  [96.3 F (35.7 C)-101.1 F (38.4 C)] 98.6 F (37 C) (07/02 0900) Pulse Rate:  [72-129] 100 (07/02 0900) Cardiac Rhythm:  [-] Sinus tachycardia (07/02 0800) Resp:  [19-42] 39 (07/02 0900) BP: (108-149)/(55-91) 137/78 mmHg (07/02 0900) SpO2:  [92 %-100 %] 95 % (07/02 0900)   Recent Labs Lab 11/29/14 1609 11/29/14 1921 11/29/14 2358 11/30/14 0342 11/30/14 0758  GLUCAP 116* 112* 152* 126* 107*    Recent Labs Lab 11/27/14 1829 11/28/14 0020 11/28/14 2118 11/29/14 0433 11/30/14 0345  NA 132* 132* 135 137 136  K 4.1 3.6 4.1 3.9 3.6  CL 97* 97* 104 107 105  CO2 22 24 22 22 22   GLUCOSE 167* 180* 164* 128* 126*  BUN 31* 26* 24* 28* 23*  CREATININE 1.07 1.05 1.06 1.01 0.89  CALCIUM 8.2* 7.6* 7.7* 7.6* 7.9*  MG  --   --   --   --  2.0  PHOS  --   --   --   --  3.3    Recent Labs Lab 11/27/14 1829 11/28/14 0020 11/28/14 2118  AST 84* 64* 56*  ALT 48 40 31  ALKPHOS 53 49 44  BILITOT 1.0 0.7 1.1  PROT 6.7 5.6* 5.5*  ALBUMIN 3.2* 2.6* 2.3*    Recent Labs Lab 11/25/14 1600 11/27/14 1829 11/28/14 0020 11/28/14 2118 11/29/14 0433 11/30/14 0345  WBC 5.3 7.8 7.5 8.9 12.6* 14.9*  NEUTROABS 4.8 6.7  --   --   --   --   HGB 13.3 13.0 11.1* 10.7* 9.9* 10.0*  HCT  39.8 37.8* 33.1* 31.5* 30.1* 30.1*  MCV 95.9 96.2 95.9 95.2 96.5 96.2  PLT 137* 94* 83* 82* 112* 146*    Recent Labs Lab 11/28/14 1708 11/28/14 2118 11/29/14 0433  TROPONINI 0.05* 0.10* 0.07*    Recent Labs  11/27/14 2000  LABPROT 13.7  INR 1.03    Recent Labs  11/27/14 1944  COLORURINE AMBER*  LABSPEC 1.027  PHURINE 5.5  GLUCOSEU NEGATIVE  HGBUR MODERATE*  BILIRUBINUR SMALL*  KETONESUR 15*  PROTEINUR 100*  UROBILINOGEN 0.2  NITRITE NEGATIVE  LEUKOCYTESUR NEGATIVE       Component Value Date/Time   CHOL 104 11/30/2014 0345   TRIG 266* 11/30/2014 0345   HDL 11* 11/30/2014 0345   CHOLHDL 9.5 11/30/2014 0345   VLDL 53* 11/30/2014 0345   LDLCALC 40 11/30/2014 0345   Lab Results  Component Value Date   HGBA1C 6.3* 11/29/2014   No results found for: LABOPIA, COCAINSCRNUR, LABBENZ, AMPHETMU, THCU, LABBARB  No results for input(s): ETH in the last 168 hours.   Imaging   Ct Head Wo Contrast 11/27/2014    No acute intracranial abnormality.  Stable pattern of cerebral cortical atrophy and moderately advanced chronic microvascular ischemic white matter  disease.    Mr Brain Wo Contrast  11/28/2014    1. Acute nonhemorrhagic white matter infarct in the anterior right frontal lobe.  2. Moderate atrophy and diffuse white matter changes are again seen bilaterally, compatible with chronic microvascular ischemia.  3. Remote lacunar infarcts of the anterior left coronal radiata.  4. Thinning of the corpus callosum compatible with the degree of generalized atrophy.       Portable Chest Xray 11/28/2014    Tubes and lines as described above in satisfactory position.  Left perihilar atelectatic change.  Aortic atherosclerotic change.      Dg Chest Port 1 View 11/27/2014   No acute abnormality noted.  Aortic atherosclerosis.   Dg Abd Portable 1v  11/28/2014    OG tube tip in body of stomach.     PHYSICAL EXAM Elderly frail caucasian male who is intubated. .  Afebrile. Head is nontraumatic. Neck is supple without bruit.    Cardiac exam no murmur or gallop. Lungs are clear to auscultation. Distal pulses are well felt. Neurological Exam :  Intubated but not sedated. Awake alert follows commands well. Extraocular movements appear full range. Blinks to threat bilaterally. Fundi were not visualized. Face is symmetric without weakness. Tongue is midline. Motor system exam able to lift both upper extremities against gravity but left more than right. No scleral extremities well against gravity as well. No obvious focal weakness. Deep tendon pulses are symmetric. Sensation appears intact bilaterally. Plantars are downgoing.   ASSESSMENT/PLAN [name] is a 81 y.o. male with history of TIA, diabetes mellitus, and prostate cancer presenting with fever and altered mental status with possible right-sided weakness.[name] He did not receive IV t-PA due to late presentation.  Stroke:  Non-dominant infarct right frontal lobe   Resultant   No focal deficits  MRI - acute nonhemorrhagic white matter infarct in the anterior right frontal lobe.   MRA  not performed  Carotid Doppler Preliminary findings: Right = 1-39% ICA stenosis. Left = 80-99% ICA stenosis.- will follow up as out pt.   2D Echo  EF 40-45%. No cardiac source of embolism identified.  LDL 40  HgbA1c pending  Ammonia level - WNL  CSF - total protein 111 (H)  SCDs for VTE prophylaxis Diet regular Room service appropriate?: Yes; Fluid consistency:: Thin  aspirin 81 mg orally every day prior to admission, now on aspirin 81 mg orally every day  Ongoing aggressive stroke risk factor management  Therapy recommendations: Pending  Disposition: Pending  Hypertension  Home meds: Norvasc and Toprol-XL     Hyperlipidemia  Home meds:   Mevacor 40 mg daily resumed in hospital  LDL pending, goal < 70  Continue statin at discharge  Diabetes  HgbA1c pending, goal <  7.0  Controlled    Other Stroke Risk Factors  Advanced age  Cigarette smoker, quit smoking 34 years ago.  ETOH use  Hx stroke/TIA  Family hx stroke (sister)    Other Active Problems  Altered mental status  Anemia with thrombocytopenia and leukocytosis- Increased to 14.9 this AM  Fever / Infection - vancomycin and doxycycline  Mild hypotension  Other Pertinent History  Essential tremor which is chronic   Asymptomatic left ICA stenosis which pt has to follow up as out pt.    Hospital day # 3  I have personally examined this patient, reviewed notes, independently viewed imaging studies, participated in medical decision making and plan of care.  Examination has improved today. Passed for diet.  Carotid Dopplers show asymptomatic high-grade left ICA stenosis which can be evaluated as an outpatient for revascularization Long discussion with patient, wife and granddaughter at the bedside and answered questions This patient is critically ill and at significant risk of neurological worsening, death and care requires constant monitoring of vital signs, hemodynamics,respiratory and cardiac monitoring, extensive review of multiple databases, frequent neurological assessment, discussion with family, other specialists and medical decision making of high complexity.I have made any additions or clarifications directly to the above note.This critical care time does not reflect procedure time, or teaching time or supervisory time of PA/NP/Med Resident etc but could involve care discussion time.  I spent 32 minutes of neurocritical care time  in the care of  this patient.   Leotis Pain  11/30/2014 9:18 AM     To contact Stroke Continuity provider, please refer to [email or web address] After hours, contact General Neurology

## 2014-11-30 NOTE — Evaluation (Signed)
Clinical/Bedside Swallow Evaluation Patient Details  Name: Jeffrey [name] MRN: [id number] Date of Birth: [birth year]  Today's Date: 11/30/2014 Time: SLP Start Time (ACUTE ONLY): 0844 SLP Stop Time (ACUTE ONLY): 0902 SLP Time Calculation (min) (ACUTE ONLY): 18 min  Past Medical History:  Past Medical History  Diagnosis Date  . Hypertension   . Hypercholesteremia   . Impaired fasting glucose   . Prostate cancer radiation + seeding implant (Dr.[name])  . Rotator cuff tear, right 10/2010    supraspinatous and infraspinatous  . Diabetes mellitus   . Macular degeneration   . Heart murmur     mild aortic stenosis  . Radiation proctitis 11/2012    treated with APC ablation and Canasa suppositories (Dr. Hilarie Fredrickson)  . TIA (transient ischemic attack) 10/02/13    hosp at [facility] x 1 night  . 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 Aortic atherosclerosis 11/25/2014  . Acute encephalopathy 11/27/2014   Past Surgical History:  Past Surgical History  Procedure Laterality Date  . Tonsillectomy and adenoidectomy  age 71  . Prostate seed implant    . Rotator cuff repair  06/16/2011    right (Dr. Berenice Primas)  . Colonoscopy N/A 11/28/2012    Procedure: COLONOSCOPY;  Surgeon: Jerene Bears, MD;  Location: [facility]OSCOPY;  Service: Gastroenterology;  Laterality: N/A;   HPI:  81 y.o. male with PMH of hypertension, hyperlipidemia,, diet-controlled diabetes mellitus, prostate cancer (s/p of radiation therapy and seed implant), IA, who presents with fever and altered mental status. Found to have thrombocytopenia; possible recent insect bite. MRI Acute nonhemorrhagic white matter infarct in the anterior right frontal lobe. Remote lacunar infarcts of the anterior left coronal radiata. Developed labored breathing, intubated 6/30-7/1.  Medial left lower lobe subsegmental atelectasis.    Assessment / Plan / Recommendation Clinical Impression  Oropharyngeal swallow observed to be within normal  limits. Hesitant respiration following initial straw sip thin possibly due to increased volume and velocity. Subsequent swallows without difficulty. Mastication and transit of solid WFL's. Recommend regular texture and thin liquids, straws allowed, pills with liquids. No follow up needed for swallow.      Aspiration Risk  Mild    Diet Recommendation Age appropriate regular solids;Thin   Medication Administration: Whole meds with liquid Compensations: Slow rate;Small sips/bites    Other  Recommendations Oral Care Recommendations: Oral care BID   Follow Up Recommendations       Frequency and Duration        Pertinent Vitals/Pain none         Swallow Study           Oral/Motor/Sensory Function Overall Oral Motor/Sensory Function: Appears within functional limits for tasks assessed   Ice Chips Ice chips: Impaired Presentation: Spoon Oral Phase Impairments: Reduced lingual movement/coordination Oral Phase Functional Implications:  (fell anterioraly from oral cavity) Pharyngeal Phase Impairments:  (none)   Thin Liquid Thin Liquid: Within functional limits Presentation: Cup;Straw    Nectar Thick Nectar Thick Liquid: Not tested   Honey Thick Honey Thick Liquid: Not tested   Puree Puree: Within functional limits   Solid   GO    Solid: Within functional limits       Houston Siren 11/30/2014,9:22 AM   Orbie Pyo Colvin Caroli.Ed [employer] [phone]

## 2014-11-30 NOTE — Progress Notes (Signed)
PULMONARY / CRITICAL CARE MEDICINE   Name: Jeffrey [name] MRN: [id number] DOB: March 23, 1933    ADMISSION DATE:  11/27/2014 CONSULTATION DATE:  11/30/2014  REFERRING MD :  Eliseo Squires  CHIEF COMPLAINT:  AMS  INITIAL PRESENTATION:  81 yo male admitted with fever, malaise, altered mental status and possible bug bite.  Developed thrombocytopenia, diffuse macular rash.  Transferred to ICU for agitation.  SIGNIFICANT EVENTS: 6/29 Admitted 6/30 ID consultation: DC vanc/zosyn. Continue doxycycline. 6/30 Neuro consultation: LP performed 6/30 Transferred to ICU with markedly worsened cognition and labored breathing. Intubated. PCCM assumed care  STUDIES: 6/29 CT head >> negative 6/29 RMSF Ab >> IgM 1.88, IgG negative 6/30 LP >> protein 111, WBC 2 6/30 MRI brain >> acute infarct Rt frontal lobe, remote lacunar infarcts 6/30 Echo >> EF 40 to 94%, grade 1 diastolic dysfx, mild AS, mild MR, PAS 43 mmHg  SUBJECTIVE:  Feels better.  No more rash.  Denies chest/abd pain.  Not much appetite.  VITAL SIGNS: Temp:  [96.6 F (35.9 C)-101.1 F (38.4 C)] 98.6 F (37 C) (07/02 0900) Pulse Rate:  [73-129] 100 (07/02 0900) Resp:  [23-42] 39 (07/02 0900) BP: (108-149)/(55-91) 137/78 mmHg (07/02 0900) SpO2:  [92 %-100 %] 95 % (07/02 0900)   INTAKE / OUTPUT: Intake/Output      07/01 0701 - 07/02 0700 07/02 0701 - 07/03 0700   P.O.     I.V. (mL/kg) 30 (0.4) 3 (0)   IV Piggyback 1650 150   Total Intake(mL/kg) 1680 (21.3) 153 (1.9)   Urine (mL/kg/hr) 1505 (0.8) 345 (1.2)   Emesis/NG output     Total Output 1505 345   Net +175 -192         PHYSICAL EXAMINATION: General: pleasant Neuro: alert, follows commands HEENT: no sinus tenderness Cardiovascular: regular, tachycardic Lungs: no wheeze Abdomen: soft, non tender Ext: no edema Skin: rash better  LABS:  CBC  Recent Labs Lab 11/28/14 2118 11/29/14 0433 11/30/14 0345  WBC 8.9 12.6* 14.9*  HGB 10.7* 9.9* 10.0*  HCT 31.5* 30.1* 30.1*  PLT  82* 112* 146*   Coag's  Recent Labs Lab 11/27/14 2000 11/27/14 2226  APTT  --  35  INR 1.03  --    BMET  Recent Labs Lab 11/28/14 2118 11/29/14 0433 11/30/14 0345  NA 135 137 136  K 4.1 3.9 3.6  CL 104 107 105  CO2 22 22 22   BUN 24* 28* 23*  CREATININE 1.06 1.01 0.89  GLUCOSE 164* 128* 126*   Electrolytes  Recent Labs Lab 11/28/14 2118 11/29/14 0433 11/30/14 0345  CALCIUM 7.7* 7.6* 7.9*  MG  --   --  2.0  PHOS  --   --  3.3   Sepsis Markers  Recent Labs Lab 11/27/14 1845 11/27/14 2226 11/28/14 0020  LATICACIDVEN 2.18* 1.1 1.7  PROCALCITON  --  1.62  --    ABG  Recent Labs Lab 11/28/14 2235  PHART 7.453*  PCO2ART 29.3*  PO2ART 469*   Liver Enzymes  Recent Labs Lab 11/27/14 1829 11/28/14 0020 11/28/14 2118  AST 84* 64* 56*  ALT 48 40 31  ALKPHOS 53 49 44  BILITOT 1.0 0.7 1.1  ALBUMIN 3.2* 2.6* 2.3*   Cardiac Enzymes  Recent Labs Lab 11/28/14 1708 11/28/14 2118 11/29/14 0433  TROPONINI 0.05* 0.10* 0.07*   Glucose  Recent Labs Lab 11/29/14 1129 11/29/14 1609 11/29/14 1921 11/29/14 2358 11/30/14 0342 11/30/14 0758  GLUCAP 99 116* 112* 152* 126* 107*   Imaging Mr  Brain Wo Contrast  11/28/2014   CLINICAL DATA:  Fever of unknown origin. Sepsis. Right-sided weakness. Difficulty speaking. Lumbar puncture immediately preceded the MRI scan.  EXAM: MRI HEAD WITHOUT CONTRAST  TECHNIQUE: Multiplanar, multiecho pulse sequences of the brain and surrounding structures were obtained without intravenous contrast.  COMPARISON:  CT head without contrast 11/27/2014  FINDINGS: An acute 1.5 cm linear white matter infarct is present in the anterior right frontal lobe.  No other acute infarcts are present. Remote lacunar infarcts are present in the anterior coronal radiata, left greater than right. Moderate periventricular T2 changes are present bilaterally.  Flow is present in the major intracranial arteries. The globes and orbits are intact. The  paranasal sinuses and mastoid air cells are clear.  There is thinning of the corpus callosum. Midline structures are otherwise within normal limits.  IMPRESSION: 1. Acute nonhemorrhagic white matter infarct in the anterior right frontal lobe. 2. Moderate atrophy and diffuse white matter changes are again seen bilaterally, compatible with chronic microvascular ischemia. 3. Remote lacunar infarcts of the anterior left coronal radiata. 4. Thinning of the corpus callosum compatible with the degree of generalized atrophy.   Electronically Signed   By: San Morelle M.D.   On: 11/28/2014 15:50   Portable Chest Xray  11/29/2014   CLINICAL DATA:  Respiratory DIS breast, sepsis, acute renal injury, acute encephalopathy.  EXAM: PORTABLE CHEST - 1 VIEW  COMPARISON:  Portable chest x-ray of November 28, 2014  FINDINGS: The lungs are adequately inflated. There is medial left lower lobe density. The cardiac silhouette is normal in size. The pulmonary vascularity is not engorged. The endotracheal tube tip lies approximately 4.3 cm above the carina. The esophagogastric tube tip projects below the inferior margin of the image. The left internal jugular venous catheter tip projects over the distal third of the SVC.  IMPRESSION: Medial left lower lobe subsegmental atelectasis. There is no CHF. The support tubes are in reasonable position.   Electronically Signed   By: David  Martinique M.D.   On: 11/29/2014 08:04   Portable Chest Xray  11/28/2014   CLINICAL DATA:  Check endotracheal tube placement  EXAM: PORTABLE CHEST - 1 VIEW  COMPARISON:  11/27/2014  FINDINGS: Endotracheal tube is now seen 4.6 cm above the carina. A nasogastric catheter is noted extending into the stomach. Left central venous line is noted with the tip at the cavoatrial junction. The lungs are well aerated. Some patchy changes are noted in the left perihilar region which may represent some early atelectasis. The cardiac shadow is within normal limits. Mild  aortic calcifications are seen. No bony abnormality is noted.  IMPRESSION: Tubes and lines as described above in satisfactory position.  Left perihilar atelectatic change.  Aortic atherosclerotic change.   Electronically Signed   By: Inez Catalina M.D.   On: 11/28/2014 20:37   Dg Abd Portable 1v  11/28/2014   CLINICAL DATA:  Evaluate OG tube placement  EXAM: PORTABLE ABDOMEN - 1 VIEW  COMPARISON:  None.  FINDINGS: The OG tube tip is in the body of the stomach. Side port is well positioned below the GE junction. Bowel gas pattern is unremarkable  IMPRESSION: 1. OG tube tip in body of stomach.   Electronically Signed   By: Kerby Moors M.D.   On: 11/28/2014 20:53    ASSESSMENT / PLAN:  Fever, macular rash, acute encephalopathy, thrombocytopenia septic shock 2nd to RMSF. Plan: - continue doxycycline - defer to ID when to d/c vancomycin, zosyn  Compromised airway due to acute encephalopathy >> resolved. Plan: - bronchial hygiene  Systolic heart failure in setting of sepsis >> ?acuity. Hx of HTN, HLD. Plan: - hold outpt norvasc, toprol for now - pravachol while in hospital - might need cardiology assessment at some point  Acute Rt frontal CVA with high grade Lt ICA stenosis. Plan: - f/u with neurology - continue ASA  AKI >> resolved. Plan: - monitor renal fx, urine outpt  Deconditioning. Plan: - PT  Hyperglycemia >> resolved. Plan: - d/c SSI  Transfer to telemetry 7/02.  Back to [facility] 7/03 and PCCM off.  Updated pt's wife at bedside.  Chesley Mires, MD [facility] Pulmonary/Critical Care 11/30/2014, 11:06 AM Pager:  [phone] After 3pm call: [phone]

## 2014-11-30 NOTE — Evaluation (Signed)
Physical Therapy Evaluation Patient Details Name: Jeffrey [name] MRN: [id number] DOB: 05/01/1933 Today's Date: 11/30/2014   History of Present Illness  6/29 Admitted to [facility] with 4 days of fever, malaise and 1 day AMS. Found to be thrombocytopenic. Possible insect bite recently. Diffuse maculopapular rash. Transferred to ICU evening of 6/30 with markedly worsened cognition and labored breathing. Intubated.  extubated 11/29/14  Clinical Impression  Pt admitted with above. Pt presenting with confusion, severe deconditioning, and increased falls risk. Pt very active and indep PTA. Pt to strongly benefit from CIR upon d/c to achieve maximal functional recovery for safe transition home with wife.    Follow Up Recommendations CIR    Equipment Recommendations  None recommended by PT    Recommendations for Other Services Rehab consult     Precautions / Restrictions Precautions Precautions: Fall Restrictions Weight Bearing Restrictions: No      Mobility  Bed Mobility Overal bed mobility: Needs Assistance Bed Mobility: Rolling;Sidelying to Sit Rolling: Mod assist Sidelying to sit: Mod assist Supine to sit: Mod assist     General bed mobility comments: required max directional and tactile cues to initiate task and complete, modAx2 for trunk elevation and LE management off bed  Transfers Overall transfer level: Needs assistance Equipment used:  (2 person lift with gait belt) Transfers: Sit to/from Stand;Stand Pivot Transfers Sit to Stand: Max assist;+2 physical assistance Stand pivot transfers: Max assist;+2 physical assistance       General transfer comment: pt extremely retropulsive with report of saying "I"m going to fall on top of that thing." referring to IV pole. max tactile cues to bring hips over feet  Ambulation/Gait                Stairs            Wheelchair Mobility    Modified Rankin (Stroke Patients Only)       Balance Overall balance assessment:  Needs assistance Sitting-balance support: Feet unsupported;Bilateral upper extremity supported Sitting balance-Leahy Scale: Poor       Standing balance-Leahy Scale: Zero Standing balance comment: maxAx2 to maintain standing                             Pertinent Vitals/Pain Pain Assessment: No/denies pain    Home Living Family/patient expects to be discharged to:: Private residence Living Arrangements: Spouse/significant other Available Help at Discharge: Family;Available 24 hours/day Type of Home: House Home Access: Stairs to enter Entrance Stairs-Rails: Right Entrance Stairs-Number of Steps: 3 Home Layout: One level   Additional Comments: family very supportive    Prior Function Level of Independence: Independent         Comments: Pt very active and enjoyed gardening      Hand Dominance   Dominant Hand: Right    Extremity/Trunk Assessment   Upper Extremity Assessment: Defer to OT evaluation           Lower Extremity Assessment: Generalized weakness (able to initiate mvmt )      Cervical / Trunk Assessment: Normal  Communication   Communication: No difficulties;HOH  Cognition Arousal/Alertness: Awake/alert Behavior During Therapy: Flat affect Overall Cognitive Status: Impaired/Different from baseline Area of Impairment: Orientation;Attention;Following commands;Safety/judgement;Awareness;Problem solving;Memory Orientation Level: Place;Time;Situation Current Attention Level: Sustained Memory: Decreased recall of precautions;Decreased short-term memory Following Commands: Follows one step commands inconsistently;Follows one step commands with increased time Safety/Judgement: Decreased awareness of safety;Decreased awareness of deficits Awareness: Emergent Problem Solving: Slow processing;Decreased initiation;Difficulty sequencing;Requires verbal  cues;Requires tactile cues General Comments: Pt requires increast time to process info and to  initiate activity . pt fluctuating in/out of confusion. Pt with significant delay in response time    General Comments General comments (skin integrity, edema, etc.): + SOB with mobility, HR at 112 during transfer    Exercises        Assessment/Plan    PT Assessment Patient needs continued PT services  PT Diagnosis Difficulty walking;Generalized weakness   PT Problem List Decreased strength;Decreased range of motion;Decreased activity tolerance;Decreased balance;Decreased mobility;Decreased safety awareness;Decreased cognition  PT Treatment Interventions DME instruction;Gait training;Stair training;Functional mobility training;Therapeutic activities;Patient/family education;Therapeutic exercise;Balance training   PT Goals (Current goals can be found in the Care Plan section) Acute Rehab PT Goals Patient Stated Goal: did not state PT Goal Formulation: With patient/family Time For Goal Achievement: 12/14/14 Potential to Achieve Goals: Good    Frequency Min 4X/week   Barriers to discharge        Co-evaluation               End of Session Equipment Utilized During Treatment: Gait belt Activity Tolerance: Patient tolerated treatment well Patient left: in chair;with call bell/phone within reach;with family/visitor present Nurse Communication: Mobility status         Time: 1142-1206 PT Time Calculation (min) (ACUTE ONLY): 24 min   Charges:   PT Evaluation $Initial PT Evaluation Tier I: 1 Procedure PT Treatments $Therapeutic Activity: 8-22 mins   PT G Codes:        Kingsley Callander 11/30/2014, 1:28 PM   Kittie Plater, PT, DPT Pager #: [phone] Office #: [phone]

## 2014-12-01 ENCOUNTER — Encounter: Payer: Self-pay | Admitting: Family Medicine

## 2014-12-01 DIAGNOSIS — I350 Nonrheumatic aortic (valve) stenosis: Secondary | ICD-10-CM

## 2014-12-01 DIAGNOSIS — N179 Acute kidney failure, unspecified: Secondary | ICD-10-CM

## 2014-12-01 DIAGNOSIS — I10 Essential (primary) hypertension: Secondary | ICD-10-CM

## 2014-12-01 DIAGNOSIS — I639 Cerebral infarction, unspecified: Secondary | ICD-10-CM

## 2014-12-01 DIAGNOSIS — I429 Cardiomyopathy, unspecified: Secondary | ICD-10-CM

## 2014-12-01 DIAGNOSIS — I248 Other forms of acute ischemic heart disease: Secondary | ICD-10-CM

## 2014-12-01 DIAGNOSIS — D696 Thrombocytopenia, unspecified: Secondary | ICD-10-CM

## 2014-12-01 DIAGNOSIS — I480 Paroxysmal atrial fibrillation: Secondary | ICD-10-CM

## 2014-12-01 DIAGNOSIS — I700 Atherosclerosis of aorta: Secondary | ICD-10-CM

## 2014-12-01 DIAGNOSIS — E780 Pure hypercholesterolemia: Secondary | ICD-10-CM

## 2014-12-01 LAB — CSF CULTURE: CULTURE: NO GROWTH

## 2014-12-01 LAB — CULTURE, RESPIRATORY

## 2014-12-01 LAB — CULTURE, RESPIRATORY W GRAM STAIN

## 2014-12-01 LAB — CSF CULTURE W GRAM STAIN

## 2014-12-01 MED ORDER — CARVEDILOL 6.25 MG PO TABS: 6.2500 mg | ORAL_TABLET | Freq: Two times a day (BID) | ORAL | Status: DC

## 2014-12-01 MED ORDER — WHITE PETROLATUM GEL
Status: DC | PRN
  Administered 2014-12-01: 0.2 via TOPICAL
  Filled 2014-12-01: qty 1

## 2014-12-01 MED ORDER — METOPROLOL SUCCINATE ER 25 MG PO TB24
25.0000 mg | ORAL_TABLET | Freq: Two times a day (BID) | ORAL | Status: DC
  Administered 2014-12-01 – 2014-12-03 (×4): 25 mg via ORAL
  Filled 2014-12-01 (×6): qty 1

## 2014-12-01 MED ORDER — METOPROLOL TARTRATE 50 MG PO TABS
50.0000 mg | ORAL_TABLET | Freq: Two times a day (BID) | ORAL | Status: DC
  Administered 2014-12-01: 50 mg via ORAL
  Filled 2014-12-01: qty 1

## 2014-12-01 MED ORDER — IPRATROPIUM-ALBUTEROL 0.5-2.5 (3) MG/3ML IN SOLN
3.0000 mL | RESPIRATORY_TRACT | Status: DC | PRN
  Administered 2014-12-01: 3 mL via RESPIRATORY_TRACT
  Filled 2014-12-01: qty 3

## 2014-12-01 MED ORDER — LISINOPRIL 5 MG PO TABS
5.0000 mg | ORAL_TABLET | Freq: Every day | ORAL | Status: DC
  Administered 2014-12-01: 5 mg via ORAL
  Filled 2014-12-01 (×2): qty 1

## 2014-12-01 MED ORDER — DOXYCYCLINE HYCLATE 100 MG PO TABS
100.0000 mg | ORAL_TABLET | Freq: Two times a day (BID) | ORAL | Status: DC
  Administered 2014-12-01 – 2014-12-03 (×4): 100 mg via ORAL
  Filled 2014-12-01 (×7): qty 1

## 2014-12-01 NOTE — Progress Notes (Signed)
Utilization Review completed. Deliah Strehlow RN BSN CM 

## 2014-12-01 NOTE — Progress Notes (Signed)
Jeffrey [name] PROGRESS NOTE  MACCOY HAUBNER DTO:[id number] DOB: 1932-07-22 DOA: 11/27/2014 PCP: Vikki Ports, MD  Assessment/Plan: 1-septic shock/sepsis due to RMSF -sepsis and shock feature essentially resolved -will continue doxycycline to complete a total of 2 weeks therapy (5/14) -continue supportive care  2-encephalopathy: slowly resolving -multifactorial: RMSF, stroke, sundowning -will continue tx for antibiotics -continue PT/OT/SPT -continue constant reorientation and if persist will add low dose seroquel  3-VDRF and presumed PNA -breathing better and stable overall -treated for 3 days with vanc/zosyn -most likely aspiration pneumonitis; no frank findings of PNA -will continue tx with doxycycline -continue bronchial hygiene  4-Acute Rt frontal CVA with high grade Lt ICA stenosis. -continue ASA, statin and risk factor modifications -appreciate neurology assistance and rec's -patient to follow as an outpatient for further decision treatment on left ICA stenosis  5-positive blood cx: 1/2 coagulase neg staph -appears to be a contaminant -will follow rec's from ID -no need of IV abx's  6-PAF: transient and resolved now -given positive stroke (CHADs VASC score is 5) -will need anticoagulation -will ask cardiology to see and help with decision -concerns for falls at this moment (given confusion and severe deconditioning)  7-chronic combined systolic/diastolic heart failure: -will monitor daily weights -follow strict intake and output -patient will be started on metoprolol and lisinopril -cardiology consulted given mild troponin elevation and hypokinesis on Echo -continue ASA and statins  8-HLD: continue statins  9-severe deconditioning and falls risk: continue PT/OT/SPT -patient will need either CIR vs SNF   Code Status: Full Family Communication: son at bedside Disposition Plan: to be determine, CIR vs  SNF   Consultants:  PCCM  Cardiology  Neurology  ID  Procedures:  See below for x-ray reports   2D Echo EF 40-45%. No cardiac source of embolism identified.  Carotid Doppler findings: Right = 1-39% ICA stenosis. Left = 80-99% ICA stenosis.  EEG 11/29/2014 - EEG is abnormal due to moderate diffuse slowing of the waking background.  Antibiotics:  Doxycycline   HPI/Subjective: Patient is afebrile, pleasantly confused and currently denying SOB, CP, nausea and vomiting.  Objective: Filed Vitals:   12/01/14 1343  BP: 156/87  Pulse: 56  Temp: 98.7 F (37.1 C)  Resp: 18    Intake/Output Summary (Last 24 hours) at 12/01/14 1441 Last data filed at 12/01/14 1347  Gross per 24 hour  Intake    370 ml  Output   1100 ml  Net   -730 ml   Filed Weights   11/27/14 1816 11/28/14 1853  Weight: 78.019 kg (172 lb) 78.9 kg (173 lb 15.1 oz)    Exam:   General:  Pleasantly confused, no CP, no SOB. Patient is afebrile and bale to follow simple commands  Cardiovascular: positive murmur, no rubs or gallops, rate controlled. No JVD  Respiratory: no wheezing, no crackles  Abdomen: soft, NT, ND, positive BS  Musculoskeletal: trace edema LE bilaterally, no cyanosis  Data Reviewed: Basic Metabolic Panel:  Recent Labs Lab 11/27/14 1829 11/28/14 0020 11/28/14 2118 11/29/14 0433 11/30/14 0345  NA 132* 132* 135 137 136  K 4.1 3.6 4.1 3.9 3.6  CL 97* 97* 104 107 105  CO2 22 24 22 22 22   GLUCOSE 167* 180* 164* 128* 126*  BUN 31* 26* 24* 28* 23*  CREATININE 1.07 1.05 1.06 1.01 0.89  CALCIUM 8.2* 7.6* 7.7* 7.6* 7.9*  MG  --   --   --   --  2.0  PHOS  --   --   --   --  3.3   Liver Function Tests:  Recent Labs Lab 11/27/14 1829 11/28/14 0020 11/28/14 2118  AST 84* 64* 56*  ALT 48 40 31  ALKPHOS 53 49 44  BILITOT 1.0 0.7 1.1  PROT 6.7 5.6* 5.5*  ALBUMIN 3.2* 2.6* 2.3*    Recent Labs Lab 11/28/14 1708  AMMONIA 13   CBC:  Recent Labs Lab 11/25/14 1600  11/27/14 1829 11/28/14 0020 11/28/14 2118 11/29/14 0433 11/30/14 0345  WBC 5.3 7.8 7.5 8.9 12.6* 14.9*  NEUTROABS 4.8 6.7  --   --   --   --   HGB 13.3 13.0 11.1* 10.7* 9.9* 10.0*  HCT 39.8 37.8* 33.1* 31.5* 30.1* 30.1*  MCV 95.9 96.2 95.9 95.2 96.5 96.2  PLT 137* 94* 83* 82* 112* 146*   Cardiac Enzymes:  Recent Labs Lab 11/28/14 1708 11/28/14 2118 11/29/14 0433  TROPONINI 0.05* 0.10* 0.07*   CBG:  Recent Labs Lab 11/29/14 2358 11/30/14 0342 11/30/14 0758 11/30/14 1144 11/30/14 1541  GLUCAP 152* 126* 107* 169* 154*    Recent Results (from the past 240 hour(s))  Urine culture     Status: None   Collection Time: 11/25/14  3:33 PM  Result Value Ref Range Status   Colony Count >=100,000 COLONIES/ML  Final   Organism ID, Bacteria Multiple bacterial morphotypes present, none  Final   Organism ID, Bacteria predominant. Suggest appropriate recollection if   Final   Organism ID, Bacteria clinically indicated.  Final  Culture, blood (single)     Status: None   Collection Time: 11/25/14  4:00 PM  Result Value Ref Range Status   Culture STAPHYLOCOCCUS SPECIES (COAGULASE NEGATIVE)  Final   Organism ID, Bacteria STAPHYLOCOCCUS SPECIES (COAGULASE NEGATIVE)  Final    Comment: The significance of isolating this organism from a single venipuncture cannot be predicted without further clinical and culture correlation Susceptibilities available only on request. Report Faxed by Request Gram Stain Report Called to,Read Back By and Verified With: [facility] SCALES 11/26/14 1520 BY SMITHERSJ       Susceptibility   Staphylococcus species (coagulase negative) -  (no method available)    PENICILLIN >=0.5 Resistant     OXACILLIN <=0.25 Sensitive     CEFAZOLIN  Sensitive     GENTAMICIN <=0.5 Sensitive     CIPROFLOXACIN <=0.5 Sensitive     LEVOFLOXACIN 0.25 Sensitive     TRIMETH/SULFA <=10 Sensitive     VANCOMYCIN <=0.5 Sensitive     CLINDAMYCIN <=0.25 Sensitive     ERYTHROMYCIN  <=0.25 Sensitive     RIFAMPIN <=0.5 Sensitive     TETRACYCLINE <=1 Sensitive   Culture, blood (routine x 2)     Status: None (Preliminary result)   Collection Time: 11/27/14  6:31 PM  Result Value Ref Range Status   Specimen Description BLOOD LEFT FOREARM  Final   Special Requests BOTTLES DRAWN AEROBIC AND ANAEROBIC 5CC  Final   Culture NO GROWTH 3 DAYS  Final   Report Status PENDING  Incomplete  Urine culture     Status: None   Collection Time: 11/27/14  7:44 PM  Result Value Ref Range Status   Specimen Description URINE, RANDOM  Final   Special Requests NONE  Final   Culture NO GROWTH 2 DAYS  Final   Report Status 11/29/2014 FINAL  Final  Culture, blood (routine x 2)     Status: None (Preliminary result)   Collection Time: 11/27/14  8:00 PM  Result Value Ref Range Status   Specimen Description BLOOD RIGHT  FOREARM  Final   Special Requests BOTTLES DRAWN AEROBIC AND ANAEROBIC 5ML  Final   Culture NO GROWTH 3 DAYS  Final   Report Status PENDING  Incomplete  CSF culture with Stat gram stain     Status: None   Collection Time: 11/28/14  1:10 PM  Result Value Ref Range Status   Specimen Description CSF  Final   Special Requests TUBE 2  Final   Gram Stain   Final    CYTOSPIN SMEAR WBC PRESENT,BOTH PMN AND MONONUCLEAR NO ORGANISMS SEEN    Culture NO GROWTH 3 DAYS  Final   Report Status 12/01/2014 FINAL  Final  MRSA PCR Screening     Status: None   Collection Time: 11/28/14  6:56 PM  Result Value Ref Range Status   MRSA by PCR NEGATIVE NEGATIVE Final    Comment:        The GeneXpert MRSA Assay (FDA approved for NASAL specimens only), is one component of a comprehensive MRSA colonization surveillance program. It is not intended to diagnose MRSA infection nor to guide or monitor treatment for MRSA infections.   Culture, respiratory (NON-Expectorated)     Status: None   Collection Time: 11/28/14  8:16 PM  Result Value Ref Range Status   Specimen Description TRACHEAL  ASPIRATE  Final   Special Requests NONE  Final   Gram Stain   Final    RARE WBC PRESENT, PREDOMINANTLY MONONUCLEAR RARE SQUAMOUS EPITHELIAL CELLS PRESENT NO ORGANISMS SEEN Performed at [facility]    Culture   Final    Non-Pathogenic Oropharyngeal-type Flora Isolated. Performed at [facility]    Report Status 12/01/2014 FINAL  Final     Studies: No results found.  Scheduled Meds: . antiseptic oral rinse  7 mL Mouth Rinse QID  . aspirin  81 mg Oral Daily  . chlorhexidine  15 mL Mouth Rinse BID  . doxycycline  100 mg Oral Q12H  . lisinopril  5 mg Oral Daily  . metoprolol tartrate  50 mg Oral BID  . pravastatin  40 mg Oral q1800  . sodium chloride  3 mL Intravenous Q12H   Continuous Infusions:   Principal Problem:   Fever Active Problems:   Essential hypertension, benign   Pure hypercholesterolemia   Prostate cancer   Type 2 diabetes mellitus without complication   Murmur   TIA (transient ischemic attack)   Aortic atherosclerosis   Sepsis   Acute encephalopathy   Thrombocytopenia   AKI (acute kidney injury)   Severe sepsis   Respiratory distress   Pyrexia   Lacunar infarct, acute   RMSF [facility] spotted fever)    Time spent: 35 minutes    Barton Dubois  Jeffrey [name] Pager [phone]. If 7PM-7AM, please contact night-coverage at [email or web address], password [facility] 12/01/2014, 2:41 PM  LOS: 4 days

## 2014-12-01 NOTE — Consult Note (Signed)
CARDIOLOGY CONSULT NOTE  Patient ID: Jeffrey [name] MRN: [id number] DOB/AGE: 1933-01-28 81 y.o.  Admit date: 11/27/2014 Primary Physician Rita Ohara A, MD  Reason for Consultation: cardiomyopathy with troponin elevation and paroxysmal atrial fibrillation.  HPI: The patient is an 81 yr old male with a history of hypertension, hyperlipidemia, TIA, aortic stenosis, and possibly diabetes mellitus (although not on hypoglycemic agents, HbA1C 6.3% on 7/1) who cardiology has been consulted for new cardiomyopathy with paroxysmal atrial fibrillation and troponin elevation. He last saw Dr. Percival Spanish in March 2013.  He was admitted on 6/29 with a fever, altered mental status, and dysarthria with possible right sided weakness. MRI brain on 6/30 showed acute nonhemorrhagic white matter infarct of the anterior right frontal lobe and remote lacunar infarcts of the anterior left coronal radiata. Carotid Dopplers showed left 80-99% ICA stenosis.  He also had anemia, thrombocytopenia, and leukocytosis with a diffuse maculopapular rash on admission and was found to have [facility] Fever, and has been placed on doxycycline (had initially been on broad-spectrum antimicrobial therapy which included vancomycin and Zosyn). He had been intubated in the interim period and was found to have a pneumonia as well.  He also developed a paroxysm of atrial fibrillation and we are consulted for anticoagulation. There is a reported concern for falls at present given patient's confusion and severe deconditioning.   ECG's on 6/30 at [place] and 1639 showed sinus tachycardia with PAC's.  Troponin trend: 0.05-->0.1-->0.07.  Echocardiogram on 02/23/11 showed normal LV systolic function, EF 76-28%, mild LVH, grade I diastolic dysfunction, moderately calcified leaflets with mild aortic stenosis (mean gradient 12 mmHg, and normal RV systolic function.  Echocardiogram on 11/28/14 showed mild to moderately reduced LV  systolic function, EF 31-51%, diffuse hypokinesis with severe apical hypokinesis, grade I diastolic dysfunction, moderately thickened and calcified aortic valve leaflets with mild stenosis, mild mitral regurgitation, mild left atrial dilatation, mildly reduced RV systolic function, mild pulmonary hypertension, with no definitive vegetations seen.  He was on ASA, statin, and beta blocker therapy (Toprol-XL) as an outpatient, and lisinopril has since been added.  Chest xray on 6/27 showed extensive aortic atherosclerosis.  No chest films have shown CHF.  Says insect bite occurred on medial aspect of left thigh approximately four months ago. Denies h/o myocardial infarction, and denies exertional chest pain and shortness of breath. Also denies leg swelling, orthopnea, syncope, and PND.  Said he had been on full strength ASA but this led to easy bruisability which is why it was cut back.  Soc: Married. Lives in [place]. 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   No Known Allergies  Current Facility-Administered Medications  Medication Dose Route Frequency Provider Last Rate Last Dose  . antiseptic oral rinse (CPC / CETYLPYRIDINIUM CHLORIDE 0.05%) solution 7 mL  7 mL Mouth Rinse QID Wilhelmina Mcardle, MD   7 mL at 12/01/14 0400  . aspirin chewable tablet 81 mg  81 mg Oral Daily Chesley Mires, MD   81 mg at 12/01/14 0936  . chlorhexidine (PERIDEX) 0.12 % solution 15 mL  15 mL Mouth Rinse BID Wilhelmina Mcardle, MD   15 mL at 12/01/14 0900  . doxycycline (VIBRA-TABS) tablet 100 mg  100 mg Oral Q12H Eudelia Bunch, RPH      . hydrocortisone cream 1 % 1 application  1 application Topical PRN Ivor Costa, MD      . lisinopril (PRINIVIL,ZESTRIL) tablet 5 mg  5 mg Oral Daily Clifton James  Dyann Kief, MD   5 mg at 12/01/14 0936  . metoprolol (LOPRESSOR) tablet 50 mg  50 mg Oral BID Barton Dubois, MD   50 mg at 12/01/14 0935  . ondansetron (ZOFRAN) injection 4 mg  4 mg Intravenous Q4H PRN Wilhelmina Mcardle, MD      .  pravastatin (PRAVACHOL) tablet 40 mg  40 mg Oral q1800 Chesley Mires, MD   40 mg at 11/30/14 1749  . sodium chloride 0.9 % injection 3 mL  3 mL Intravenous Q12H Ivor Costa, MD   3 mL at 11/30/14 2242    Past Medical History  Diagnosis Date  . Hypertension   . Hypercholesteremia   . Impaired fasting glucose   . Prostate cancer radiation + seeding implant (Dr.[name])  . Rotator cuff tear, right 10/2010    supraspinatous and infraspinatous  . Diabetes mellitus   . Macular degeneration   . Heart murmur     mild aortic stenosis  . Radiation proctitis 11/2012    treated with APC ablation and Canasa suppositories (Dr. Hilarie Fredrickson)  . TIA (transient ischemic attack) 10/02/13    hosp [facility] x 1 night  . 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 Aortic atherosclerosis 11/25/2014  . Acute encephalopathy 11/27/2014  . RMSF [facility] spotted fever) 10/2014    (hosp with FUO, encephalopathy)  . Multiple lacunar infarcts     Past Surgical History  Procedure Laterality Date  . Tonsillectomy and adenoidectomy  age 75  . Prostate seed implant    . Rotator cuff repair  06/16/2011    right (Dr. Berenice Primas)  . Colonoscopy N/A 11/28/2012    Procedure: COLONOSCOPY;  Surgeon: Jerene Bears, MD;  Location: [facility];  Service: Gastroenterology;  Laterality: N/A;    History   Social History  . Marital Status: Married    Spouse Name: N/A  . Number of Children: 4  . Years of Education: N/A   Occupational History  . Retired    Social History Main Topics  . Smoking status: Former Smoker -- 1.00 packs/day for 20 years    Types: Cigarettes    Quit date: 05/31/1980  . Smokeless tobacco: Never Used  . Alcohol Use: 1.2 oz/week    2 Cans of beer per week     Comment: around 2 beers daily (occasionally a third) as max since hospitalizaton 5/15.  . Drug Use: No  . Sexual Activity:    Partners: Female   Other Topics Concern  . Not on file   Social History Narrative    Retired Engineer, structural.  2 sons, 2 daughters (all in [place]), 15 grandchildren, 1 great grandchild     No family history of premature CAD in 1st degree relatives.  Prior to Admission medications   Medication Sig Start Date End Date Taking? Authorizing Provider  Acetaminophen (TYLENOL ARTHRITIS PAIN PO) Take 2 tablets by mouth 2 (two) times daily.     Yes Historical Provider, MD  amLODipine (NORVASC) 2.5 MG tablet TAKE 1 TABLET (2.5 MG TOTAL) BY MOUTH DAILY. 10/17/14  Yes Rita Ohara, MD  aspirin 81 MG tablet Take 81 mg by mouth 2 (two) times daily.    Yes Historical Provider, MD  doxylamine, Sleep, (EQ SLEEP AID) 25 MG tablet Take 25 mg by mouth at bedtime as needed for sleep.   Yes Historical Provider, MD  hydrocortisone cream 1 % Apply 1 application topically as needed (for ankles).   Yes Historical Provider, MD  lovastatin (  MEVACOR) 20 MG tablet Take 2 tablets (40 mg total) by mouth at bedtime. 07/17/14  Yes Rita Ohara, MD  metoprolol succinate (TOPROL-XL) 50 MG 24 hr tablet TAKE 1 TABLET BY MOUTH TWICE A DAY TAKE WITH OR IMMEDIATELY FOLLOWING A MEAL 10/03/14  Yes Rita Ohara, MD  Multiple Vitamins-Minerals (PRESERVISION AREDS) CAPS Take 1 capsule by mouth every morning.   Yes Historical Provider, MD  naproxen sodium (ANAPROX) 220 MG tablet Take 220 mg by mouth 2 (two) times daily as needed (for pain).   Yes Historical Provider, MD     Review of systems complete and found to be negative unless listed above in HPI     Physical exam Blood pressure 156/87, pulse 56, temperature 98.7 F (37.1 C), temperature source Oral, resp. rate 18, height 5\' 11"  (1.803 m), weight 173 lb 15.1 oz (78.9 kg), SpO2 92 %. General: NAD Neck: No JVD, no thyromegaly or thyroid nodule.  Lungs: Clear to auscultation bilaterally with normal respiratory effort. CV: Nondisplaced PMI. Irregular rhythm, normal S1/S2, no S3, 2/6 ejection systolic murmur heard best over RUSB.  No peripheral edema.  No carotid bruit.   Abdomen:  Soft, nontender, no distention.  Skin: Intact without lesions or rashes.  Neurologic: Alert and oriented.  Psych: Normal affect. Extremities: No clubbing or cyanosis.  HEENT: Normal.   ECG: Most recent ECG reviewed.  Labs:   Lab Results  Component Value Date   WBC 14.9* 11/30/2014   HGB 10.0* 11/30/2014   HCT 30.1* 11/30/2014   MCV 96.2 11/30/2014   PLT 146* 11/30/2014    Recent Labs Lab 11/28/14 2118  11/30/14 0345  NA 135  < > 136  K 4.1  < > 3.6  CL 104  < > 105  CO2 22  < > 22  BUN 24*  < > 23*  CREATININE 1.06  < > 0.89  CALCIUM 7.7*  < > 7.9*  PROT 5.5*  --   --   BILITOT 1.1  --   --   ALKPHOS 44  --   --   ALT 31  --   --   AST 56*  --   --   GLUCOSE 164*  < > 126*  < > = values in this interval not displayed. Lab Results  Component Value Date   TROPONINI 0.07* 11/29/2014    Lab Results  Component Value Date   CHOL 104 11/30/2014   CHOL 166 04/10/2014   CHOL 170 11/12/2013   Lab Results  Component Value Date   HDL 11* 11/30/2014   HDL 53 04/10/2014   HDL 60 11/12/2013   Lab Results  Component Value Date   LDLCALC 40 11/30/2014   LDLCALC 79 04/10/2014   LDLCALC 78 11/12/2013   Lab Results  Component Value Date   TRIG 266* 11/30/2014   TRIG 169* 04/10/2014   TRIG 158* 11/12/2013   Lab Results  Component Value Date   CHOLHDL 9.5 11/30/2014   CHOLHDL 3.1 04/10/2014   CHOLHDL 2.8 11/12/2013   No results found for: LDLDIRECT       Studies: No results found.  ASSESSMENT AND PLAN:  1. Cardiomyopathy (EF 40-45% with severe apical hypokinesis) with minimal troponin elevation: Currently euvolemic and denies chest pain and shortness of breath. Given severe apical hypokinesis, suggestive of left anterior descending coronary artery disease which is compatible with his numerous cardiovascular risk factors (HTN, hyperlipidemia, ?diabetes-although not on hypoglycemic agents, HbA1C 6.3% on 7/1). Would recommend outpatient nuclear myocardial  perfusion study to evaluate for ischemia. Does not need coronary angiography at this time. Continue ASA and statin therapy along with lisinopril. Will switch metoprolol tartrate to succinate 25 mg bid (helpful with cardiomyopathy) and can be titrated up as an outpatient. Can d/c outpatient low-dose amlodipine and increase lisinopril as needed to control BP.  2. Paroxysmal atrial fibrillation: I have reviewed all telemetry strips. Several labeled "atrial fibrillation" actually demonstrate sinus rhythm with frequent PAC's in a pattern of bigeminy. However, brief paroxysms of atrial fibrillation were also noted. Frequent PVC's also noted. Toprol-XL will help with rate control should recurrent paroxysms arise. With respect to anticoagulation, CHADS2VASC score is at least 7 (chronic systolic CHF, HTN, age, CVA, extensive aortic atherosclerosis) thus placing him at high thromboembolic risk. As acute CVA is nonhemorrhagic, would consider initiating Xarelto 20 mg daily (would confer with neurology first).  3. Left ICA stenosis: Given CVA, will eventually need revascularization which can be arranged for as an outpatient. For now, needs optimal BP control. Continue ASA and statin therapy.  4. Essential HTN: Elevated. Can increase lisinopril as needed.  5. Valvular heart disease with mild aortic stenosis: Stable. Will monitor clinically.  6. RMSF: Currently on doxycycline. I do not think he warrants a TEE at this time. He is hemodynamically stable and is being treated appropriately.    Signed: Kate Sable, M.D., F.A.C.C.  12/01/2014, 3:09 PM

## 2014-12-01 NOTE — Progress Notes (Signed)
INFECTIOUS DISEASE PROGRESS NOTE  ID: Jeffrey [name] is a 81 y.o. male with  Principal Problem:   Fever Active Problems:   Essential hypertension, benign   Pure hypercholesterolemia   Prostate cancer   Type 2 diabetes mellitus without complication   Murmur   TIA (transient ischemic attack)   Aortic atherosclerosis   Sepsis   Acute encephalopathy   Thrombocytopenia   AKI (acute kidney injury)   Severe sepsis   Respiratory distress   Pyrexia   Lacunar infarct, acute   RMSF [facility] spotted fever)   Acute CVA (cerebrovascular accident)  Subjective: Sleeping fitfully  Abtx:  Anti-infectives    Start     Dose/Rate Route Frequency Ordered Stop   12/01/14 2200  doxycycline (VIBRA-TABS) tablet 100 mg     100 mg Oral Every 12 hours 12/01/14 1048     11/29/14 1030  piperacillin-tazobactam (ZOSYN) IVPB 3.375 g  Status:  Discontinued     3.375 g 12.5 mL/hr over 240 Minutes Intravenous Every 8 hours 11/29/14 1001 12/01/14 0734   11/29/14 1000  doxycycline (VIBRAMYCIN) 100 mg in dextrose 5 % 250 mL IVPB  Status:  Discontinued     100 mg 125 mL/hr over 120 Minutes Intravenous Every 12 hours 11/29/14 0003 12/01/14 1048   11/28/14 2045  ampicillin-sulbactam (UNASYN) 1.5 g in sodium chloride 0.9 % 50 mL IVPB  Status:  Discontinued     1.5 g 100 mL/hr over 30 Minutes Intravenous Every 6 hours 11/28/14 2030 11/29/14 0949   11/28/14 2030  vancomycin (VANCOCIN) IVPB 750 mg/150 ml premix  Status:  Discontinued     750 mg 150 mL/hr over 60 Minutes Intravenous Every 12 hours 11/28/14 2028 12/01/14 0734   11/28/14 0500  piperacillin-tazobactam (ZOSYN) IVPB 3.375 g  Status:  Discontinued     3.375 g 12.5 mL/hr over 240 Minutes Intravenous Every 8 hours 11/27/14 2141 11/28/14 1234   11/27/14 2200  doxycycline (VIBRAMYCIN) 200 mg in dextrose 5 % 250 mL IVPB  Status:  Discontinued     200 mg 125 mL/hr over 120 Minutes Intravenous Every 12 hours 11/27/14 2141 11/29/14 0003   11/27/14  2100  vancomycin (VANCOCIN) IVPB 750 mg/150 ml premix  Status:  Discontinued     750 mg 150 mL/hr over 60 Minutes Intravenous Every 12 hours 11/27/14 2049 11/28/14 1234   11/27/14 2045  piperacillin-tazobactam (ZOSYN) IVPB 3.375 g     3.375 g 12.5 mL/hr over 240 Minutes Intravenous  Once 11/27/14 2044 11/28/14 0058   11/27/14 1930  cefTRIAXone (ROCEPHIN) 2 g in dextrose 5 % 50 mL IVPB  Status:  Discontinued     2 g 100 mL/hr over 30 Minutes Intravenous  Once 11/27/14 1917 11/27/14 1922   11/27/14 1930  cefTRIAXone (ROCEPHIN) 2 g in dextrose 5 % 50 mL IVPB  Status:  Discontinued     2 g 100 mL/hr over 30 Minutes Intravenous Every 24 hours 11/27/14 1922 11/27/14 2048      Medications:  Scheduled: . antiseptic oral rinse  7 mL Mouth Rinse QID  . aspirin  81 mg Oral Daily  . chlorhexidine  15 mL Mouth Rinse BID  . doxycycline  100 mg Oral Q12H  . lisinopril  5 mg Oral Daily  . metoprolol tartrate  50 mg Oral BID  . pravastatin  40 mg Oral q1800  . sodium chloride  3 mL Intravenous Q12H    Objective: Vital signs in last 24 hours: Temp:  [98.7 F (37.1  C)] 98.7 F (37.1 C) (07/03 1343) Pulse Rate:  [56-109] 56 (07/03 1343) Resp:  [18-40] 18 (07/03 1343) BP: (141-156)/(78-87) 156/87 mmHg (07/03 1343) SpO2:  [90 %-92 %] 92 % (07/03 1343)   General appearance: no distress  Lab Results  Recent Labs  11/29/14 0433 11/30/14 0345  WBC 12.6* 14.9*  HGB 9.9* 10.0*  HCT 30.1* 30.1*  NA 137 136  K 3.9 3.6  CL 107 105  CO2 22 22  BUN 28* 23*  CREATININE 1.01 0.89   Liver Panel  Recent Labs  11/28/14 2118  PROT 5.5*  ALBUMIN 2.3*  AST 56*  ALT 31  ALKPHOS 44  BILITOT 1.1   Sedimentation Rate No results for input(s): ESRSEDRATE in the last 72 hours. C-Reactive Protein No results for input(s): CRP in the last 72 hours.  Microbiology: Recent Results (from the past 240 hour(s))  Urine culture     Status: None   Collection Time: 11/25/14  3:33 PM  Result Value Ref  Range Status   Colony Count >=100,000 COLONIES/ML  Final   Organism ID, Bacteria Multiple bacterial morphotypes present, none  Final   Organism ID, Bacteria predominant. Suggest appropriate recollection if   Final   Organism ID, Bacteria clinically indicated.  Final  Culture, blood (single)     Status: None   Collection Time: 11/25/14  4:00 PM  Result Value Ref Range Status   Culture STAPHYLOCOCCUS SPECIES (COAGULASE NEGATIVE)  Final   Organism ID, Bacteria STAPHYLOCOCCUS SPECIES (COAGULASE NEGATIVE)  Final    Comment: The significance of isolating this organism from a single venipuncture cannot be predicted without further clinical and culture correlation Susceptibilities available only on request. Report Faxed by Request Gram Stain Report Called to,Read Back By and Verified With: [facility] SCALES 11/26/14 1520 BY SMITHERSJ       Susceptibility   Staphylococcus species (coagulase negative) -  (no method available)    PENICILLIN >=0.5 Resistant     OXACILLIN <=0.25 Sensitive     CEFAZOLIN  Sensitive     GENTAMICIN <=0.5 Sensitive     CIPROFLOXACIN <=0.5 Sensitive     LEVOFLOXACIN 0.25 Sensitive     TRIMETH/SULFA <=10 Sensitive     VANCOMYCIN <=0.5 Sensitive     CLINDAMYCIN <=0.25 Sensitive     ERYTHROMYCIN <=0.25 Sensitive     RIFAMPIN <=0.5 Sensitive     TETRACYCLINE <=1 Sensitive   Culture, blood (routine x 2)     Status: None (Preliminary result)   Collection Time: 11/27/14  6:31 PM  Result Value Ref Range Status   Specimen Description BLOOD LEFT FOREARM  Final   Special Requests BOTTLES DRAWN AEROBIC AND ANAEROBIC 5CC  Final   Culture NO GROWTH 4 DAYS  Final   Report Status PENDING  Incomplete  Urine culture     Status: None   Collection Time: 11/27/14  7:44 PM  Result Value Ref Range Status   Specimen Description URINE, RANDOM  Final   Special Requests NONE  Final   Culture NO GROWTH 2 DAYS  Final   Report Status 11/29/2014 FINAL  Final  Culture, blood (routine x 2)      Status: None (Preliminary result)   Collection Time: 11/27/14  8:00 PM  Result Value Ref Range Status   Specimen Description BLOOD RIGHT FOREARM  Final   Special Requests BOTTLES DRAWN AEROBIC AND ANAEROBIC 5ML  Final   Culture NO GROWTH 4 DAYS  Final   Report Status PENDING  Incomplete  CSF culture  with Stat gram stain     Status: None   Collection Time: 11/28/14  1:10 PM  Result Value Ref Range Status   Specimen Description CSF  Final   Special Requests TUBE 2  Final   Gram Stain   Final    CYTOSPIN SMEAR WBC PRESENT,BOTH PMN AND MONONUCLEAR NO ORGANISMS SEEN    Culture NO GROWTH 3 DAYS  Final   Report Status 12/01/2014 FINAL  Final  MRSA PCR Screening     Status: None   Collection Time: 11/28/14  6:56 PM  Result Value Ref Range Status   MRSA by PCR NEGATIVE NEGATIVE Final    Comment:        The GeneXpert MRSA Assay (FDA approved for NASAL specimens only), is one component of a comprehensive MRSA colonization surveillance program. It is not intended to diagnose MRSA infection nor to guide or monitor treatment for MRSA infections.   Culture, respiratory (NON-Expectorated)     Status: None   Collection Time: 11/28/14  8:16 PM  Result Value Ref Range Status   Specimen Description TRACHEAL ASPIRATE  Final   Special Requests NONE  Final   Gram Stain   Final    RARE WBC PRESENT, PREDOMINANTLY MONONUCLEAR RARE SQUAMOUS EPITHELIAL CELLS PRESENT NO ORGANISMS SEEN Performed at [facility]    Culture   Final    Non-Pathogenic Oropharyngeal-type Flora Isolated. Performed at [facility]    Report Status 12/01/2014 FINAL  Final    Studies/Results: No results found.   Assessment/Plan: CVA RMSF, fever rash VDRF, ? Aspiration BCx 1/2 MSSE  Total days of antibiotics: 5 doxy, vanco/zosyn (off)  Would plan for 2 weeks total of doxy Explained to family Encouraged them to keep him awake as much as possible during day to prevent sundowning.  Will  defer on his next step (SNF, rehab) to primary Available as needed.           Bobby Rumpf Infectious Diseases (pager) [phone] [email or web address] 12/01/2014, 3:42 PM  LOS: 4 days

## 2014-12-01 NOTE — Progress Notes (Signed)
Patients family complained that patient seemed increasingly short of breath. Patient agreed that he felt more trouble breathing. Lungs auscultated, diminished in bases. Patient was repositioned, nasal cannula applied at 2L. On-call provider notified. Patient states after position change and oxygen application that breathing has eased. O2 sats up to 96% on 2L. Continuous pulse oximetry applied. Will continue to monitor.

## 2014-12-01 NOTE — Progress Notes (Signed)
STROKE TEAM PROGRESS NOTE   HISTORY Jeffrey [name] is an 81 y.o. male with hx of TIA, DM, prostate CA presenting with fever and altered mental status since Saturday. Family notes difficulty speaking, trouble getting his words out. They also note some possible right sided weakness. Deny any recent medication changes, no tick exposure, no sick contacts. Denies a headache. At baseline they report he is active and independent.       SUBJECTIVE (INTERVAL HISTORY) The patient's wife is at the bedside. She feels he is doing better. He was able to converse with her this morning. We briefly discussed the left internal carotid artery stenosis and the need for treatment at some point in the future. We also discussed possible inpatient rehabilitation admission.  OBJECTIVE Temp:  [98.7 F (37.1 C)] 98.7 F (37.1 C) (07/02 2213) Pulse Rate:  [90-109] 109 (07/02 2213) Cardiac Rhythm:  [-] Sinus tachycardia (07/02 1950) Resp:  [24-40] 40 (07/02 2213) BP: (134-151)/(72-80) 141/80 mmHg (07/02 2213) SpO2:  [90 %-98 %] 90 % (07/02 2213)   Recent Labs Lab 11/29/14 2358 11/30/14 0342 11/30/14 0758 11/30/14 1144 11/30/14 1541  GLUCAP 152* 126* 107* 169* 154*    Recent Labs Lab 11/27/14 1829 11/28/14 0020 11/28/14 2118 11/29/14 0433 11/30/14 0345  NA 132* 132* 135 137 136  K 4.1 3.6 4.1 3.9 3.6  CL 97* 97* 104 107 105  CO2 22 24 22 22 22   GLUCOSE 167* 180* 164* 128* 126*  BUN 31* 26* 24* 28* 23*  CREATININE 1.07 1.05 1.06 1.01 0.89  CALCIUM 8.2* 7.6* 7.7* 7.6* 7.9*  MG  --   --   --   --  2.0  PHOS  --   --   --   --  3.3    Recent Labs Lab 11/27/14 1829 11/28/14 0020 11/28/14 2118  AST 84* 64* 56*  ALT 48 40 31  ALKPHOS 53 49 44  BILITOT 1.0 0.7 1.1  PROT 6.7 5.6* 5.5*  ALBUMIN 3.2* 2.6* 2.3*    Recent Labs Lab 11/25/14 1600 11/27/14 1829 11/28/14 0020 11/28/14 2118 11/29/14 0433 11/30/14 0345  WBC 5.3 7.8 7.5 8.9 12.6* 14.9*  NEUTROABS 4.8 6.7  --   --   --   --    HGB 13.3 13.0 11.1* 10.7* 9.9* 10.0*  HCT 39.8 37.8* 33.1* 31.5* 30.1* 30.1*  MCV 95.9 96.2 95.9 95.2 96.5 96.2  PLT 137* 94* 83* 82* 112* 146*    Recent Labs Lab 11/28/14 1708 11/28/14 2118 11/29/14 0433  TROPONINI 0.05* 0.10* 0.07*   No results for input(s): LABPROT, INR in the last 72 hours. No results for input(s): COLORURINE, LABSPEC, [place], GLUCOSEU, HGBUR, BILIRUBINUR, KETONESUR, PROTEINUR, UROBILINOGEN, NITRITE, LEUKOCYTESUR in the last 72 hours.  Invalid input(s): APPERANCEUR     Component Value Date/Time   CHOL 104 11/30/2014 0345   TRIG 266* 11/30/2014 0345   HDL 11* 11/30/2014 0345   CHOLHDL 9.5 11/30/2014 0345   VLDL 53* 11/30/2014 0345   LDLCALC 40 11/30/2014 0345   Lab Results  Component Value Date   HGBA1C 6.3* 11/29/2014   No results found for: LABOPIA, COCAINSCRNUR, LABBENZ, AMPHETMU, THCU, LABBARB  No results for input(s): ETH in the last 168 hours.   EEG 11/29/2014 Impression: This awake and asleep EEG is abnormal due to moderate diffuse slowing of the waking background.  Clinical Correlation of the above findings indicates diffuse cerebral dysfunction that is non-specific in etiology and can be seen with hypoxic/ischemic injury, toxic/metabolic encephalopathies, or  medication effect. The absence of epileptiform discharges does not rule out a clinical diagnosis of epilepsy. Clinical correlation is advised.  Imaging   Ct Head Wo Contrast 11/27/2014    No acute intracranial abnormality.  Stable pattern of cerebral cortical atrophy and moderately advanced chronic microvascular ischemic white matter disease.     Mr Brain Wo Contrast  11/28/2014    1. Acute nonhemorrhagic white matter infarct in the anterior right frontal lobe.  2. Moderate atrophy and diffuse white matter changes are again seen bilaterally, compatible with chronic microvascular ischemia.  3. Remote lacunar infarcts of the anterior left coronal radiata.  4. Thinning of the  corpus callosum compatible with the degree of generalized atrophy.       Portable Chest Xray 11/28/2014    Tubes and lines as described above in satisfactory position.  Left perihilar atelectatic change.  Aortic atherosclerotic change.      Dg Chest Port 1 View 11/27/2014   No acute abnormality noted.  Aortic atherosclerosis.   Dg Abd Portable 1v  11/28/2014    OG tube tip in body of stomach.   Dg Chest Port 1 View 11/29/2014 Medial left lower lobe subsegmental atelectasis. There is no CHF. The support tubes are in reasonable position.   PHYSICAL EXAM Elderly frail caucasian male who is intubated. . Afebrile. Head is nontraumatic. Neck is supple without bruit.    Cardiac exam no murmur or gallop. Lungs are clear to auscultation. Distal pulses are well felt. Neurological Exam :  Awake alert follows commands with minimal prompting. Extraocular movements appear full range. Blinks to threat bilaterally. Fundi were not visualized. Face is symmetric without weakness. Tongue is midline. Motor system exam able to lift both upper extremities against gravity but left more than right. Lifts lower extremities against gravity as well. No obvious focal weakness. Deep tendon pulses are symmetric. Sensation appears intact bilaterally. Plantars are downgoing.   ASSESSMENT/PLAN Jeffrey [name] is a 81 y.o. male with history of TIA, diabetes mellitus, and prostate cancer presenting with fever and altered mental status with possible right-sided weakness.[name] He did not receive IV t-PA due to late presentation.  Stroke:  Non-dominant infarct right frontal lobe   Resultant   No focal deficits  MRI - acute nonhemorrhagic white matter infarct in the anterior right frontal lobe.   MRA  not performed  Carotid Doppler Preliminary findings: Right = 1-39% ICA stenosis. Left = 80-99% ICA stenosis.- will follow up as out pt.   2D Echo  EF 40-45%. No cardiac source of embolism identified.  LDL 40  HgbA1c  pending  Ammonia level - WNL  CSF - total protein 111 (H)  EEG 11/29/2014 - EEG is abnormal due to moderate diffuse slowing of the waking background. (See report above)  SCDs for VTE prophylaxis Diet regular Room service appropriate?: Yes; Fluid consistency:: Thin  aspirin 81 mg orally every day prior to admission, now on aspirin 81 mg orally every day  Ongoing aggressive stroke risk factor management  Therapy recommendations: CIR recommended - M.D. consult ordered and pending.  Disposition: Pending  Hypertension  Home meds: Norvasc and Toprol-XL  Now on lisinopril 5 mg daily and metoprolol 50 mg twice daily  Blood pressure stable     Hyperlipidemia  Home meds:   Mevacor 40 mg daily resumed in hospital as Pravachol  LDL 40, goal < 70  Continue statin at discharge  Diabetes  HgbA1c pending, goal < 7.0  Controlled    Other Stroke Risk  Factors  Advanced age  Cigarette smoker, quit smoking 34 years ago.  ETOH use  Hx stroke/TIA  Family hx stroke (sister)    Other Active Problems  Altered mental status  Anemia with thrombocytopenia and leukocytosis- Increased to 14.9 this AM  [facility] Spotted Fever - vancomycin discontinued. Patient continues on doxycycline.  Mild hypotension - much improved  EEG - abnormal as above   Other Pertinent History  Essential tremor which is chronic   Asymptomatic left ICA stenosis which pt has to follow up as out pt.    Hospital day # 4   Mikey Bussing [facility] Pager [phone] 12/01/2014, 11:30 AM  Awaiting CIR placement.  D/c planning. Call with questions.  Leotis Pain    To contact Stroke Continuity provider, please refer to [email or web address] After hours, contact General Neurology

## 2014-12-02 ENCOUNTER — Inpatient Hospital Stay (HOSPITAL_COMMUNITY): Payer: Medicare Other

## 2014-12-02 ENCOUNTER — Encounter (HOSPITAL_COMMUNITY): Payer: Self-pay | Admitting: Internal Medicine

## 2014-12-02 DIAGNOSIS — I5043 Acute on chronic combined systolic (congestive) and diastolic (congestive) heart failure: Secondary | ICD-10-CM

## 2014-12-02 DIAGNOSIS — G9340 Encephalopathy, unspecified: Secondary | ICD-10-CM

## 2014-12-02 DIAGNOSIS — R0600 Dyspnea, unspecified: Secondary | ICD-10-CM

## 2014-12-02 DIAGNOSIS — A770 Spotted fever due to Rickettsia rickettsii: Secondary | ICD-10-CM

## 2014-12-02 LAB — CULTURE, BLOOD (ROUTINE X 2)
CULTURE: NO GROWTH
Culture: NO GROWTH

## 2014-12-02 LAB — WEST NILE AB, IGG AND IGM, CSF: West Nile Ab, IgG, CSF: <1.3 index (ref <1.30)

## 2014-12-02 MED ORDER — FUROSEMIDE 10 MG/ML IJ SOLN
40.0000 mg | Freq: Two times a day (BID) | INTRAMUSCULAR | Status: DC
  Administered 2014-12-02 – 2014-12-03 (×3): 40 mg via INTRAVENOUS
  Filled 2014-12-02 (×5): qty 4

## 2014-12-02 MED ORDER — BUDESONIDE 0.25 MG/2ML IN SUSP
0.2500 mg | Freq: Two times a day (BID) | RESPIRATORY_TRACT | Status: DC
  Administered 2014-12-02: 0.25 mg via RESPIRATORY_TRACT
  Filled 2014-12-02: qty 2

## 2014-12-02 MED ORDER — LISINOPRIL 20 MG PO TABS
20.0000 mg | ORAL_TABLET | Freq: Every day | ORAL | Status: DC
  Administered 2014-12-02 – 2014-12-03 (×2): 20 mg via ORAL
  Filled 2014-12-02 (×3): qty 1

## 2014-12-02 MED ORDER — FUROSEMIDE 10 MG/ML IJ SOLN: 40.0000 mg | Freq: Two times a day (BID) | INTRAMUSCULAR | Status: DC

## 2014-12-02 MED ORDER — IPRATROPIUM-ALBUTEROL 0.5-2.5 (3) MG/3ML IN SOLN: 3.0000 mL | Freq: Four times a day (QID) | RESPIRATORY_TRACT | Status: DC | PRN

## 2014-12-02 MED ORDER — BUDESONIDE 0.25 MG/2ML IN SUSP
0.2500 mg | Freq: Two times a day (BID) | RESPIRATORY_TRACT | Status: DC
  Administered 2014-12-02 – 2014-12-03 (×2): 0.25 mg via RESPIRATORY_TRACT
  Filled 2014-12-02 (×4): qty 2

## 2014-12-02 NOTE — H&P (Signed)
  Physical Medicine and Rehabilitation Admission H&P    Chief Complaint  Patient presents with  . Right sided weakness, visual deficits, encephalopathy.     HPI:   Jeffrey [name] is a 81 y.o. RH-male with history of HTN, DM type 2, macular degeneration who was admitted on 11/27/14 with confusion,difficulty walking, fever and mild right sided weakness with concerns of encephalitis. MRI brain done revealing He was found to be anemic with thrombocytopenia, leukocytosis with work up indicating [place] Spotted Fever. MRI brain done revealing acute nonhemorrhagic white matter infarct in the anterior right frontal lobe and LP with glucose 70 and protein111. He developed septic shock with respiratory failure requiring intubation and was treated with broad spectrum antibiotics due to presumed PNA. He was extubated to [facility] on 07/01 and swallow evaluation done with initiation of regular diet. EEG revealed moderate diffuse slowing without epileptiform discharges. Carotid dopplers with L- 80-89% ICA stenosis. 2D echo with EF 40-45% with diffuse hypokinesis and moderate calcification AV. Neurology recommends low dose ASA for secondary stroke prevention and to follow up with [facility] past discharge for asymptomatic L-ICA stenosis. Cardiology consulted 07/03 due to elevated cardiac enzymes and bigeminy with question of PAF. Dr. Klein evaluated patient and felt that patient with MAT and no definite A fib. He recommended ASA and 30 day monitor if no A fib detected in house.   Patient developed SOB with tachypnea on 07/03 pm and CXR done revealing LML airspace disease concerning for PNA. No additional antibiotics needed per ID and patient treated with IV diuresis for acute on chronic combined systolic/diastolic CHF.   Therapy ongoing and CIR recommended by MD and rehab team   Review of Systems  Constitutional: Positive for weight loss.  HENT: Negative for hearing loss.   Eyes: Negative for blurred vision.    Respiratory: Negative for cough and shortness of breath.   Cardiovascular: Negative for chest pain and palpitations.  Gastrointestinal: Negative for heartburn and nausea.       Anorexia  Genitourinary: Positive for urgency and frequency.       Gets up 4-5 times at nights.   Musculoskeletal: Positive for myalgias and joint pain (bilateral hands OA).  Skin: Positive for rash (resolving). Negative for itching.  Neurological: Positive for focal weakness (right shoulder due to RTC pathology). Negative for dizziness, tingling and headaches.  Endo/Heme/Allergies: Does not bruise/bleed easily.  Psychiatric/Behavioral: Positive for memory loss. The patient has insomnia. The patient is not nervous/anxious.       Past Medical History  Diagnosis Date  . Hypertension   . Hypercholesteremia   . Impaired fasting glucose   . Prostate cancer radiation + seeding implant (Dr.[name])  . Rotator cuff tear, right 10/2010    supraspinatous and infraspinatous  . Diabetes mellitus   . Macular degeneration   . Heart murmur     mild aortic stenosis  . Radiation proctitis 11/2012    treated with APC ablation and Canasa suppositories (Dr. Pyrtle)  . TIA (transient ischemic attack) 10/02/13    hosp at [facility] x 1 night  . 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 Aortic atherosclerosis 11/25/2014  . Acute encephalopathy 11/27/2014  . 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 Multiple lacunar infarcts   . Acute CVA (cerebrovascular accident)   . Acute on chronic combined systolic and diastolic CHF (congestive heart failure)     Past Surgical History  Procedure Laterality Date  .   Tonsillectomy and adenoidectomy  age 7  . Prostate seed implant    . Rotator cuff repair  06/16/2011    right (Dr. Graves)  . Colonoscopy N/A 11/28/2012    Procedure: COLONOSCOPY;  Surgeon: Jay M Pyrtle, MD;  Location: [facility];  Service: Gastroenterology;   Laterality: N/A;    Family History  Problem Relation Age of Onset  . Heart disease Father 65    Died suddenly.  No diagnosis  . Stroke Sister   . Dementia Mother   . Diabetes Mother   . Cancer Daughter     ovarian (?) vs other male cancer; s/p hyst doing well  . Diabetes Daughter     GDM, and now AODM  . Heart disease Son     congestive heart failure--improved     Social History:  Married. 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 Independent and does a lot of gardening. He reports that he quit smoking about 34 years ago. His smoking use included Cigarettes. He has a 20 pack-year smoking history. He has never used smokeless tobacco. He reports that he drinks about 2-4 beers daily. He reports that he does not use illicit drugs.    Allergies: No Known Allergies    Medications Prior to Admission  Medication Sig Dispense Refill  . Acetaminophen (TYLENOL ARTHRITIS PAIN PO) Take 2 tablets by mouth 2 (two) times daily.      . amLODipine (NORVASC) 2.5 MG tablet TAKE 1 TABLET (2.5 MG TOTAL) BY MOUTH DAILY. 90 tablet 1  . aspirin 81 MG tablet Take 81 mg by mouth 2 (two) times daily.     . doxylamine, Sleep, (EQ SLEEP AID) 25 MG tablet Take 25 mg by mouth at bedtime as needed for sleep.    . hydrocortisone cream 1 % Apply 1 application topically as needed (for ankles).    . lovastatin (MEVACOR) 20 MG tablet Take 2 tablets (40 mg total) by mouth at bedtime. 180 tablet 1  . metoprolol succinate (TOPROL-XL) 50 MG 24 hr tablet TAKE 1 TABLET BY MOUTH TWICE A DAY TAKE WITH OR IMMEDIATELY FOLLOWING A MEAL 180 tablet 0  . Multiple Vitamins-Minerals (PRESERVISION AREDS) CAPS Take 1 capsule by mouth every morning.    . naproxen sodium (ANAPROX) 220 MG tablet Take 220 mg by mouth 2 (two) times daily as needed (for pain).      Home: Home Living Family/patient expects to be discharged to:: Private residence Living Arrangements: Spouse/significant other Available Help at Discharge:  Family, Available 24 hours/day Type of Home: House Home Access: Stairs to enter Entrance Stairs-Number of Steps: 3 Entrance Stairs-Rails: Right Home Layout: One level Additional Comments: family very supportive  Lives With: Spouse   Functional History: Prior Function Level of Independence: Independent Comments: Pt very active and enjoyed gardening   Functional Status:  Mobility: Bed Mobility Overal bed mobility: Needs Assistance Bed Mobility: Sidelying to Sit, Rolling Rolling: Supervision Sidelying to sit: Mod assist Supine to sit: Mod assist Sit to supine: Min assist General bed mobility comments: pt attempted to initiate supine to sit on his own but was unable to succeed at getting EOB, hand over hand guidance for rolling and then pt was able to roll with only supervision. Mod HHA for SL to sit. Pt was able to scoot hips to EOB with vc's and increased time but no physical assist. Transfers Overall transfer level: Needs assistance Equipment used: Rolling walker (2 wheeled) Transfers: Sit to/from Stand, Stand Pivot Transfers Sit to Stand:   Mod assist Stand pivot transfers: Mod assist General transfer comment: Pt with increased posterior lean in standing resulting in LOB and the need for mod facilitation to maintain balance.      ADL: ADL Overall ADL's : Needs assistance/impaired Eating/Feeding: NPO Grooming: Wash/dry hands, Minimal assistance, Standing Upper Body Bathing: Maximal assistance, Sitting Lower Body Bathing: Maximal assistance, Sit to/from stand Upper Body Dressing : Maximal assistance Lower Body Dressing: Total assistance, Sit to/from stand Toilet Transfer: Moderate assistance, BSC, Ambulation, RW Toileting- Clothing Manipulation and Hygiene: Moderate assistance, Sit to/from stand Functional mobility during ADLs: Moderate assistance, Minimal assistance General ADL Comments: Pt initially mod assist for standing and mobility secondary to increased posterior LOB.   Progressed to min assist level after standing for a short period of time and when standing at the sink to wash his hands post toileting.   Cognition: Cognition Overall Cognitive Status: Impaired/Different from baseline Arousal/Alertness: Awake/alert Orientation Level: Oriented to person, Oriented to place, Oriented to situation, Disoriented to time Attention: Selective Selective Attention: Impaired Selective Attention Impairment: Verbal basic, Functional basic Memory: Impaired Memory Impairment: Storage deficit, Retrieval deficit Awareness: Impaired Awareness Impairment: Emergent impairment Problem Solving: Impaired Problem Solving Impairment: Verbal basic, Functional basic Behaviors: Impulsive Safety/Judgment: Impaired Cognition Arousal/Alertness: Awake/alert Behavior During Therapy: Flat affect Overall Cognitive Status: Impaired/Different from baseline Area of Impairment: Memory, Safety/judgement, Awareness Orientation Level: Place, Time, Situation Current Attention Level: Selective Memory: Decreased short-term memory Following Commands: Follows one step commands inconsistently, Follows multi-step commands inconsistently Safety/Judgement: Decreased awareness of safety Awareness: Intellectual Problem Solving: Slow processing, Difficulty sequencing, Requires verbal cues General Comments: pt with delayed processing physically and verbally but appropriate with responses throughout session. ,Flat affect is a change for him per family. Gaze avoidance but performs when cued.   P Blood pressure 161/81, pulse 92, temperature 97.6 F (36.4 C), temperature source Oral, resp. rate 32, height 5' 11" (1.803 m), weight 78.9 kg (173 lb 15.1 oz), SpO2 92 %. Physical Exam  Nursing note and vitals reviewed. Constitutional: He appears well-developed and well-nourished. No distress.  Thin frail appearing male.   HENT:  Head: Normocephalic and atraumatic.  Right Ear: External ear normal.  Left  Ear: External ear normal.  Eyes: Conjunctivae are normal. Pupils are equal, round, and reactive to light. Right eye exhibits no discharge. Left eye exhibits no discharge. No scleral icterus.  Neck: Normal range of motion. Neck supple. No JVD present. No tracheal deviation present. No thyromegaly present.  Cardiovascular: Normal rate.  An irregular rhythm present.  Murmur heard. Respiratory: Effort normal. No tachypnea. No respiratory distress. He has decreased breath sounds in the right lower field and the left lower field. He has wheezes. He has rhonchi. He exhibits no tenderness.  Audible rhonchi.   GI: Soft. Bowel sounds are normal. He exhibits no distension. There is no tenderness. There is no rebound and no guarding.  Musculoskeletal: He exhibits no edema or tenderness.  Lymphadenopathy:    He has no cervical adenopathy.  Neurological: He is alert.  Oriented to self and place as "Health" only.  Unable to recall month or recent holiday but able to recall city and year. Makes eye contact and able to follow simple one and two step commands. Has flat affect with monotonous speech pattern.  Moves all 4's. Slow to initiate movement of all limbs. Strength  3+ deltoid, bicep, tricep, wrist, HI.  LE: 2/5 HF, 3- ke,  3+ adf/apf in the le's. Limited insight and awareness. Decreased processing, memory but showing improvement.   No focal CN abnl noted.   Skin: Skin is warm and dry. He is not diaphoretic.  Flaky skin BLE  Psychiatric: His affect is blunt. His speech is delayed. He is slowed. Cognition and memory are impaired.    Results for orders placed or performed during the hospital encounter of 11/27/14 (from the past 48 hour(s))  CBC     Status: Abnormal   Collection Time: 12/03/14  5:27 AM  Result Value Ref Range   WBC 14.9 (H) 4.0 - 10.5 K/uL   RBC 3.34 (L) 4.22 - 5.81 MIL/uL   Hemoglobin 10.7 (L) 13.0 - 17.0 g/dL   HCT 32.4 (L) 39.0 - 52.0 %   MCV 97.0 78.0 - 100.0 fL   MCH 32.0 26.0 - 34.0  pg   MCHC 33.0 30.0 - 36.0 g/dL   RDW 13.4 11.5 - 15.5 %   Platelets 390 150 - 400 K/uL  Basic metabolic panel     Status: Abnormal   Collection Time: 12/03/14  5:27 AM  Result Value Ref Range   Sodium 142 135 - 145 mmol/L   Potassium 3.2 (L) 3.5 - 5.1 mmol/L   Chloride 103 101 - 111 mmol/L   CO2 27 22 - 32 mmol/L   Glucose, Bld 147 (H) 65 - 99 mg/dL   BUN 23 (H) 6 - 20 mg/dL   Creatinine, Ser 0.91 0.61 - 1.24 mg/dL   Calcium 8.5 (L) 8.9 - 10.3 mg/dL   GFR calc non Af Amer >60 >60 mL/min   GFR calc Af Amer >60 >60 mL/min    Comment: (NOTE) The eGFR has been calculated using the CKD EPI equation. This calculation has not been validated in all clinical situations. eGFR's persistently <60 mL/min signify possible Chronic Kidney Disease.    Anion gap 12 5 - 15   Dg Chest Port 1 View  12/03/2014   CLINICAL DATA:  Tachypnea.  History of pneumonia  EXAM: PORTABLE CHEST - 1 VIEW  COMPARISON:  Chest x-ray from yesterday  FINDINGS: Stable pattern of asymmetric bilateral airspace disease. No effusion or diffuse septal thickening.  Stable cardiomegaly and aortic contours.  IMPRESSION: Stable bilateral airspace disease, again favored to reflect pneumonia.   Electronically Signed   By: Jonathon  Watts M.D.   On: 12/03/2014 00:42   Dg Chest Port 1 View  12/02/2014   CLINICAL DATA:  Increasing shortness of breath. Hospital admission for fever.  EXAM: PORTABLE CHEST - 1 VIEW  COMPARISON:  11/29/2014.  11/25/2014.  FINDINGS: Low volume chest. This accentuates the size of the cardiopericardial silhouette which is upper limits of normal for projection.  Monitoring leads project over the chest.  LEFT perihilar airspace disease is present. This may represent asymmetric pulmonary edema or pneumonia.  Increased density is present over the RIGHT scapula, favored to be due to bony overlap rather than airspace disease.  IMPRESSION: New LEFT mid lung airspace disease around the LEFT hilum suspicious for pneumonia.  Asymmetric/ atypical pulmonary edema less likely. Followup PA and lateral chest X-ray is recommended in 3-4 weeks following trial of antibiotic therapy to ensure resolution and exclude underlying malignancy.   Electronically Signed   By: Geoffrey  Lamke M.D.   On: 12/02/2014 11:28       Medical Problem List and Plan: 1. 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  DVT Prophylaxis/Anticoagulation: Pharmaceutical: Lovenox 3. Pain Management: Tylenol prn of arthritic pain 4. Mood: LCSW to follow for evaluation and support.    5. Neuropsych: This patient is not capable of making decisions on his own behalf. 6. Skin/Wound Care: routine pressure relief measures. Maintain adequate nutrition and hydration status.  7. Fluids/Electrolytes/Nutrition:  Monitor I/O. Monitor renal status with diuretics on board.  8. RMSF:  Two weeks of doxycyline recommended for treatment--D # 7/14.  9.  Thrombocytopenia: Slowly resolving. Check platelets in am. 10. Leucocytosis:  Monitor for now.  No additional antibiotics needed.  11. Acute renal insufficiency: Improving. Monitor with serial checks.   12. Acute on chronic systolic/diastolic CHF:  Check daily weights.  Toprol increased to 50 mg bid and lasix changed to po. Continue ACE1.  13. HTN: Monitor every 8 hours and continue to titrate medications for tighter control.  14. Prediabetes: Hgb A1C- 6.2. Educate family on CM/HH diet.  15. Hypokalemia: Due to diuresis. Will add daily supplement.  16. PAF/MAT: Family to pick up event monitor today.    Post Admission Physician Evaluation: 1. Functional deficits secondary  to Right frontal lobe infarct, Encephalopathy related to RMSF 2. Patient is admitted to receive collaborative, interdisciplinary care between the physiatrist, rehab nursing staff, and therapy team. 3. Patient's level of medical complexity and substantial therapy needs in context of that medical  necessity cannot be provided at a lesser intensity of care such as a SNF. 4. Patient has experienced substantial functional loss from his/her baseline which was documented above under the "Functional History" and "Functional Status" headings.  Judging by the patient's diagnosis, physical exam, and functional history, the patient has potential for functional progress which will result in measurable gains while on inpatient rehab.  These gains will be of substantial and practical use upon discharge  in facilitating mobility and self-care at the household level. 5. Physiatrist will provide 24 hour management of medical needs as well as oversight of the therapy plan/treatment and provide guidance as appropriate regarding the interaction of the two. 6. 24 hour rehab nursing will assist with bladder management, bowel management, safety, skin/wound care, disease management, medication administration and patient education  and help integrate therapy concepts, techniques,education, etc. 7. PT will assess and treat for/with: Lower extremity strength, range of motion, stamina, balance, functional mobility, safety, adaptive techniques and equipment, NMR, cognitive perceptual awareness, family education, stamina.   Goals are: supervision. 8. OT will assess and treat for/with: ADL's, functional mobility, safety, upper extremity strength, adaptive techniques and equipment, NMR, cognitive perceptual awareness, family ed, community reintegration.   Goals are: supervision to min assist. Therapy may proceed with showering this patient. 9. SLP will assess and treat for/with: cognition, communication.  Goals are: supervision to min assist. 10. Case Management and Social Worker will assess and treat for psychological issues and discharge planning. 11. Team conference will be held weekly to assess progress toward goals and to determine barriers to discharge. 12. Patient will receive at least 3 hours of therapy per day at least 5  days per week. 13. ELOS: 15-20 days       14. Prognosis:  excellent     Zachary T. Swartz, MD, FAAPMR [facility] 12/03/2014   12/03/2014 

## 2014-12-02 NOTE — Care Management (Signed)
Important Message  Patient Details  Name: Jeffrey [name] MRN: [id number] Date of Birth: [birth year]   Medicare Important Message Given:  Yes-second notification given    [name] 12/02/2014, 10:04 AM

## 2014-12-02 NOTE — Progress Notes (Signed)
Physical Therapy Treatment Patient Details Name: Jeffrey [name] MRN: [id number] DOB: 05-30-1933 Today's Date: 12/02/2014    History of Present Illness 6/29 Admitted to [facility] with 4 days of fever, malaise and 1 day AMS. Found to be thrombocytopenic. Diffuse maculopapular rash. Tick bite. Transferred to ICU evening of 6/30 with markedly worsened cognition and labored breathing. Intubated.  extubated 11/29/14. Pos for RMSF as well as CVA ant right frontal found on MRI 6/30    PT Comments    Pt progressing cognitively and with mobility, was able to perform sit to stand with +2 min A. Required +2 mod A to transfer to chair partially because of generalized weakness and partially due to proprioceptive deficits and apraxia. PT will continue to follow.   Follow Up Recommendations  CIR     Equipment Recommendations  None recommended by PT    Recommendations for Other Services Rehab consult     Precautions / Restrictions Precautions Precautions: Fall Restrictions Weight Bearing Restrictions: No    Mobility  Bed Mobility Overal bed mobility: Needs Assistance Bed Mobility: Sidelying to Sit;Rolling Rolling: Supervision Sidelying to sit: Mod assist       General bed mobility comments: pt attempted to initiate supine to sit on his own but was unable to succeed at getting EOB, hand over hand guidance for rolling and then pt was able to roll with only supervision. Mod HHA for SL to sit. Pt was able to scoot hips to EOB with vc's and increased time but no physical assist.  Transfers Overall transfer level: Needs assistance Equipment used: 2 person hand held assist Transfers: Sit to/from [facility] Sit to Stand: Min assist;+2 physical assistance Stand pivot transfers: Mod assist;+2 physical assistance       General transfer comment: pt able to stand today with +2 min A, feet and knees blocked but no buckling, though knees remained in bilateral flexion due to stiffness. Pt  stood 2x with same assist level. When pt began to try to step to chair he began shuffling feet, then took a large step over therapist's foot, question visual changes. Was able to take pivot steps with verbal and tactile cues but had great difficulty backing to chair, posterior lean with trunk flexion.   Ambulation/Gait                 Stairs            Wheelchair Mobility    Modified Rankin (Stroke Patients Only) Modified Rankin (Stroke Patients Only) Pre-Morbid Rankin Score: No symptoms Modified Rankin: Severe disability     Balance Overall balance assessment: Needs assistance Sitting-balance support: Feet supported Sitting balance-Leahy Scale: Fair Sitting balance - Comments: able to sit EOB without UE support, kept wt more to right side Postural control: Posterior lean Standing balance support: Bilateral upper extremity supported;During functional activity Standing balance-Leahy Scale: Zero Standing balance comment: could maintain static standing with +2 min A, but required +2 mod with dynamic activity                    Cognition Arousal/Alertness: Awake/alert Behavior During Therapy: Flat affect Overall Cognitive Status: Impaired/Different from baseline Area of Impairment: Attention;Memory;Following commands;Safety/judgement;Awareness;Problem solving   Current Attention Level: Selective Memory: Decreased short-term memory Following Commands: Follows one step commands consistently;Follows multi-step commands inconsistently;Follows multi-step commands with increased time Safety/Judgement: Decreased awareness of deficits Awareness: Emergent Problem Solving: Slow processing;Difficulty sequencing;Requires verbal cues;Requires tactile cues General Comments: pt with delayed processing physically and verbally but appropriate  with responses throughout session. ,Flat affect is a change for him per family. Gaze avoidance but performs when cued.     Exercises       General Comments General comments (skin integrity, edema, etc.): O2 sats 94% on RA, HR 115 with activity      Pertinent Vitals/Pain Pain Assessment: No/denies pain    Home Living                      Prior Function            PT Goals (current goals can now be found in the care plan section) Acute Rehab PT Goals Patient Stated Goal: return home PT Goal Formulation: With patient/family Time For Goal Achievement: 12/14/14 Potential to Achieve Goals: Good Progress towards PT goals: Progressing toward goals    Frequency  Min 4X/week    PT Plan Current plan remains appropriate    Co-evaluation             End of Session Equipment Utilized During Treatment: Gait belt Activity Tolerance: Patient tolerated treatment well Patient left: in chair;with call bell/phone within reach;with family/visitor present     Time: 1201-1230 PT Time Calculation (min) (ACUTE ONLY): 29 min  Charges:  $Therapeutic Activity: 23-37 mins                    G Codes:     Leighton Roach, PT  Acute Rehab Services  [phone] Leighton Roach 12/02/2014, 12:56 PM

## 2014-12-02 NOTE — Evaluation (Signed)
Speech Language Pathology Evaluation Patient Details Name: Jeffrey [name] MRN: [id number] DOB: Sep 30, 1932 Today's Date: 12/02/2014 Time: 1524-1600 SLP Time Calculation (min) (ACUTE ONLY): 36 min  Problem List:  Patient Active Problem List   Diagnosis Date Noted  . Acute CVA (cerebrovascular accident)   . Left carotid artery stenosis 11/30/2014  . RMSF [facility] spotted fever)   . Pyrexia   . Lacunar infarct, acute   . AKI (acute kidney injury)   . Severe sepsis   . Respiratory distress   . Fever 11/27/2014  . Sepsis 11/27/2014  . Acute encephalopathy 11/27/2014  . Thrombocytopenia 11/27/2014  . Blood poisoning   . Aortic atherosclerosis 11/25/2014  . 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 Awareness alteration, transient 12/26/2013  . Low serum testosterone level 12/25/2013  . TIA (transient ischemic attack) 10/10/2013  . Radiation proctitis 11/28/2012  . Colon cancer screening 11/28/2012  . Murmur 02/23/2011  . Type 2 diabetes mellitus without complication 49/70/2637  . Essential hypertension, benign 11/26/2010  . Impaired fasting glucose 11/26/2010  . Pure hypercholesterolemia 11/26/2010  . Prostate cancer 11/26/2010   Past Medical History:  Past Medical History  Diagnosis Date  . Hypertension   . Hypercholesteremia   . Impaired fasting glucose   . Prostate cancer radiation + seeding implant (Dr.[name])  . Rotator cuff tear, right 10/2010    supraspinatous and infraspinatous  . Diabetes mellitus   . Macular degeneration   . Heart murmur     mild aortic stenosis  . Radiation proctitis 11/2012    treated with APC ablation and Canasa suppositories (Dr. Hilarie Fredrickson)  . TIA (transient ischemic attack) 10/02/13    hosp at [facility] x 1 night  . 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 Aortic atherosclerosis 11/25/2014  . Acute encephalopathy 11/27/2014  . RMSF [facility] spotted fever) 10/2014     (hosp with FUO, encephalopathy)  . Multiple lacunar infarcts   . Acute CVA (cerebrovascular accident)    Past Surgical History:  Past Surgical History  Procedure Laterality Date  . Tonsillectomy and adenoidectomy  age 62  . Prostate seed implant    . Rotator cuff repair  06/16/2011    right (Dr. Berenice Primas)  . Colonoscopy N/A 11/28/2012    Procedure: COLONOSCOPY;  Surgeon: Jerene Bears, MD;  Location: [facility] ENDOSCOPY;  Service: Gastroenterology;  Laterality: N/A;   HPI:  81 y.o. male with PMH of hypertension, hyperlipidemia,, diet-controlled diabetes mellitus, prostate cancer (s/p of radiation therapy and seed implant), IA, who presents with fever and altered mental status. Found to have thrombocytopenia; possible recent insect bite. MRI Acute nonhemorrhagic white matter infarct in the anterior right frontal lobe. Remote lacunar infarcts of the anterior left coronal radiata. Developed labored breathing, intubated 6/30-7/1.  Medial left lower lobe subsegmental atelectasis.    Assessment / Plan / Recommendation Clinical Impression  Pt presents with cognitive-linguistic deficits marked by impaired selective attention, decreased working memory, and decreased problem-solving.  Pt had difficulty participating in the first five-points-worth of material from the [facility] (due to vision and positioning). With those items eliminated, he achieved a 14/25 on the [facility].  Recommend SLP f/u to address cognition once D/Cd to CIR.  Pt/dtr educated re: results and recommendations.  They are in agreement.     SLP Assessment  Patient needs continued Speech Lanaguage Pathology Services    Follow Up Recommendations  Inpatient Rehab    Frequency and Duration min 2x/week  1 week  Pertinent Vitals/Pain Pain Assessment: No/denies pain   SLP Goals  Potential to Achieve Goals (ACUTE ONLY): Good  SLP Evaluation Prior Functioning  Cognitive/Linguistic Baseline: Within functional limits Type of Home: House  Lives With:  Spouse Available Help at Discharge: Family;Available 24 hours/day   Cognition  Overall Cognitive Status: Impaired/Different from baseline Arousal/Alertness: Awake/alert Orientation Level: Oriented to person;Oriented to place;Disoriented to time Attention: Selective Selective Attention: Impaired Selective Attention Impairment: Verbal basic;Functional basic Memory: Impaired Memory Impairment: Storage deficit;Retrieval deficit Awareness: Impaired Awareness Impairment: Emergent impairment Problem Solving: Impaired Problem Solving Impairment: Verbal basic;Functional basic Behaviors: Impulsive Safety/Judgment: Impaired    Comprehension  Auditory Comprehension Overall Auditory Comprehension: Appears within functional limits for tasks assessed Visual Recognition/Discrimination Discrimination: Within Function Limits Reading Comprehension Reading Status: Not tested    Expression Expression Primary Mode of Expression: Verbal Verbal Expression Overall Verbal Expression: Appears within functional limits for tasks assessed Written Expression Dominant Hand: Right Written Expression: Not tested   Oral / Motor Oral Motor/Sensory Function Overall Oral Motor/Sensory Function: Appears within functional limits for tasks assessed Motor Speech Overall Motor Speech: Appears within functional limits for tasks assessed   [name], [place] CCC/SLP Pager [phone]      [name] 12/02/2014, 4:07 PM

## 2014-12-02 NOTE — Progress Notes (Signed)
Rehab Admissions Coordinator Note:  Patient was screened by Retta Diones for appropriateness for an Inpatient Acute Rehab Consult.  At this time, an inpatient rehab consult has been ordered.  We will follow up once consult is completed by rehab MD.   Jodell Cipro M 12/02/2014, 8:36 AM  I can be reached at [phone].

## 2014-12-02 NOTE — Consult Note (Signed)
Physical Medicine and Rehabilitation Consult  Reason for Consult: Right sided weakness, confusion and encephalopathy.   Referring Physician: Dr. Halford Chessman   HPI: Jeffrey [name] is a 81 y.o. RH-male with history of HTN, DM type 2, macular degeneration who was admitted on 11/27/14 with confusion,difficulty walking, fever and mild right sided weakness with concerns of encephalitis. MRI brain done revealing He was found to be anemic with thrombocytopenia, leukocytosis with work up indicating [facility] Fever.   MRI brain done revealing acute nonhemorrhagic white matter infarct in the anterior right frontal lobe and LP with glucose 70 and protein111. He developed septic shock with respiratory failure requiring intubation and was treated with broad spectrum antibiotics due to presumed PNA.  He was extubated to [place] on 07/01 and swallow evaluation done with initiation of regular diet. EEG revealed moderate diffuse slowing without epileptiform discharges. Carotid dopplers with L- 80-89% ICA stenosis. 2D echo with EF 40-45% with diffuse hypokinesis and moderate calcification AV. Neurology recommends low dose ASA  for secondary stroke prevention and to follow up with [facility] past discharge for asymptomatic L-ICA stenosis. Cardiology consulted 07/03 due to elevated cardiac enzymes and bigeminy with PAF.  Xarelto recommended by Dr. Bronson Ing once cleared by neurology. Therapy evaluations done this weekend and CIR recommended by MD and rehab team.    Review of Systems  Constitutional: Positive for fever. Negative for chills.  Eyes: Negative for blurred vision.  Respiratory: Negative for cough.   Cardiovascular: Negative for chest pain.  Gastrointestinal: Negative for heartburn.  Musculoskeletal: Negative for myalgias.  Neurological: Positive for focal weakness. Negative for headaches.  Psychiatric/Behavioral: Positive for memory loss. The patient has insomnia.       Past Medical History    Diagnosis Date  . Hypertension   . Hypercholesteremia   . Impaired fasting glucose   . Prostate cancer radiation + seeding implant (Dr.[name])  . Rotator cuff tear, right 10/2010    supraspinatous and infraspinatous  . Diabetes mellitus   . Macular degeneration   . Heart murmur     mild aortic stenosis  . Radiation proctitis 11/2012    treated with APC ablation and Canasa suppositories (Dr. Hilarie Fredrickson)  . TIA (transient ischemic attack) 10/02/13    hosp at [facility] x 1 night  . 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 Aortic atherosclerosis 11/25/2014  . Acute encephalopathy 11/27/2014  . RMSF [facility] spotted fever) 10/2014    (hosp with FUO, encephalopathy)  . Multiple lacunar infarcts   . Acute CVA (cerebrovascular accident)     Past Surgical History  Procedure Laterality Date  . Tonsillectomy and adenoidectomy  age 38  . Prostate seed implant    . Rotator cuff repair  06/16/2011    right (Dr. Berenice Primas)  . Colonoscopy N/A 11/28/2012    Procedure: COLONOSCOPY;  Surgeon: Jerene Bears, MD;  Location: [facility];  Service: Gastroenterology;  Laterality: N/A;    Family History  Problem Relation Age of Onset  . Heart disease Father 46    Died suddenly.  No diagnosis  . Stroke Sister   . Dementia Mother   . Diabetes Mother   . Cancer Daughter     ovarian (?) vs other male cancer; s/p hyst doing well  . Diabetes Daughter     GDM, and now AODM  . Heart disease Son     congestive heart failure--improved    Social History:  reports that he quit smoking  about 34 years ago. His smoking use included Cigarettes. He has a 20 pack-year smoking history. He has never used smokeless tobacco. He reports that he drinks about 1.2 oz of alcohol per week. He reports that he does not use illicit drugs.    Allergies: No Known Allergies    Medications Prior to Admission  Medication Sig Dispense Refill  . Acetaminophen (TYLENOL ARTHRITIS PAIN PO) Take 2  tablets by mouth 2 (two) times daily.      [name] amLODipine (NORVASC) 2.5 MG tablet TAKE 1 TABLET (2.5 MG TOTAL) BY MOUTH DAILY. 90 tablet 1  . aspirin 81 MG tablet Take 81 mg by mouth 2 (two) times daily.     [name] doxylamine, Sleep, (EQ SLEEP AID) 25 MG tablet Take 25 mg by mouth at bedtime as needed for sleep.    . hydrocortisone cream 1 % Apply 1 application topically as needed (for ankles).    . lovastatin (MEVACOR) 20 MG tablet Take 2 tablets (40 mg total) by mouth at bedtime. 180 tablet 1  . metoprolol succinate (TOPROL-XL) 50 MG 24 hr tablet TAKE 1 TABLET BY MOUTH TWICE A DAY TAKE WITH OR IMMEDIATELY FOLLOWING A MEAL 180 tablet 0  . Multiple Vitamins-Minerals (PRESERVISION AREDS) CAPS Take 1 capsule by mouth every morning.    . naproxen sodium (ANAPROX) 220 MG tablet Take 220 mg by mouth 2 (two) times daily as needed (for pain).      Home: Home Living Family/patient expects to be discharged to:: Private residence Living Arrangements: Spouse/significant other Available Help at Discharge: Family, Available 24 hours/day Type of Home: House Home Access: Stairs to enter Technical brewer of Steps: 3 Entrance Stairs-Rails: Right Home Layout: One level Additional Comments: family very supportive  Functional History: Prior Function Level of Independence: Independent Comments: Pt very active and enjoyed gardening  Functional Status:  Mobility: Bed Mobility Overal bed mobility: Needs Assistance Bed Mobility: Rolling, Sidelying to Sit Rolling: Mod assist Sidelying to sit: Mod assist Supine to sit: Mod assist Sit to supine: Min assist General bed mobility comments: required max directional and tactile cues to initiate task and complete, modAx2 for trunk elevation and LE management off bed Transfers Overall transfer level: Needs assistance Equipment used:  (2 person lift with gait belt) Transfers: Sit to/from Stand, Stand Pivot Transfers Sit to Stand: Max assist, +2 physical  assistance Stand pivot transfers: Max assist, +2 physical assistance General transfer comment: pt extremely retropulsive with report of saying "I"m going to fall on top of that thing." referring to IV pole. max tactile cues to bring hips over feet      ADL: ADL Overall ADL's : Needs assistance/impaired Eating/Feeding: NPO Grooming: Wash/dry face, Oral care, Wash/dry hands, Moderate assistance, Sitting Upper Body Bathing: Maximal assistance, Sitting Lower Body Bathing: Maximal assistance, Sit to/from stand Upper Body Dressing : Maximal assistance Lower Body Dressing: Total assistance, Sit to/from stand Toilet Transfer: Total assistance Toileting- Clothing Manipulation and Hygiene: Total assistance General ADL Comments: Pt moves slowly and requires multiple rest breaks  Cognition: Cognition Overall Cognitive Status: Impaired/Different from baseline Orientation Level: Oriented to person, Oriented to place, Oriented to time, Disoriented to situation Cognition Arousal/Alertness: Awake/alert Behavior During Therapy: Flat affect Overall Cognitive Status: Impaired/Different from baseline Area of Impairment: Orientation, Attention, Following commands, Safety/judgement, Awareness, Problem solving, Memory Orientation Level: Place, Time, Situation Current Attention Level: Sustained Memory: Decreased recall of precautions, Decreased short-term memory Following Commands: Follows one step commands inconsistently, Follows one step commands with increased time Safety/Judgement: Decreased  awareness of safety, Decreased awareness of deficits Awareness: Emergent Problem Solving: Slow processing, Decreased initiation, Difficulty sequencing, Requires verbal cues, Requires tactile cues General Comments: Pt requires increast time to process info and to initiate activity . pt fluctuating in/out of confusion. Pt with significant delay in response time   Blood pressure 154/67, pulse 55, temperature 98.7  F (37.1 C), temperature source Oral, resp. rate 18, height 5\' 11"  (1.803 m), weight 78.9 kg (173 lb 15.1 oz), SpO2 98 %. Physical Exam  Constitutional: He appears well-developed.  HENT:  Head: Normocephalic.  Eyes: Pupils are equal, round, and reactive to light.  Neck: Normal range of motion.  Cardiovascular: Normal rate.   Respiratory: Effort normal.  GI: Soft.  Musculoskeletal:  Moves all 4's. Slow to initiate movement of all limbs. Strength 3/5 prox to 3+ distally in the UE's and 2/5 HF to 3+ adf/apf in the le's. Limited insight and awareness. Decreased processing, memory. No focal CN abnl  Psychiatric:  flat    No results found for this or any previous visit (from the past 24 hour(s)). No results found.  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 Does the need for close, 24 hr/day medical supervision in concert with the patient's rehab needs make it unreasonable for this patient to be served in a less intensive setting? Yes 2. Co-Morbidities requiring supervision/potential complications: htn, sepsis, aki 3. Due to bladder management, bowel management, safety, skin/wound care, disease management, medication administration, pain management and patient education, does the patient require 24 hr/day rehab nursing? Yes 4. Does the patient require coordinated care of a physician, rehab nurse, PT (1-2 hrs/day, 5 days/week), OT (1-2 hrs/day, 5 days/week) and SLP (1-2 hrs/day, 5 days/week) to address physical and functional deficits in the context of the above medical diagnosis(es)? Yes Addressing deficits in the following areas: balance, endurance, locomotion, strength, transferring, bowel/bladder control, bathing, dressing, feeding, grooming, toileting, cognition, speech, language, swallowing and psychosocial support 5. Can the patient actively participate in an intensive therapy program of at least 3 hrs of therapy per day at least 5 days per  week? Yes 6. The potential for patient to make measurable gains while on inpatient rehab is excellent 7. Anticipated functional outcomes upon discharge from inpatient rehab are supervision  with PT, supervision with OT, supervision and min assist with SLP. 8. Estimated rehab length of stay to reach the above functional goals is: 16-22 days 9. Does the patient have adequate social supports and living environment to accommodate these discharge functional goals? Yes 10. Anticipated D/C setting: Home 11. Anticipated post D/C treatments: HH therapy and Outpatient therapy 12. Overall Rehab/Functional Prognosis: excellent  RECOMMENDATIONS: This patient's condition is appropriate for continued rehabilitative care in the following setting: CIR Patient has agreed to participate in recommended program. N/A Note that insurance prior authorization may be required for reimbursement for recommended care.  Comment: Pt with excellent family supports. Rehab Admissions Coordinator to follow up.  Thanks,  Meredith Staggers, MD, Mellody Drown     12/02/2014

## 2014-12-02 NOTE — Progress Notes (Signed)
Rehab admissions - Evaluated for possible admission.  I met briefly with family and gave them rehab booklets.  Per grandson, MD says patient may be ready for rehab tomorrow.  I will check back in am for medical readiness.  Call me for questions.  #[phone]

## 2014-12-02 NOTE — Clinical Social Work Note (Signed)
CSW met with patient and patient's children at bedside to offer SNF placement at alternative to CIR admission. Family is hopeful for CIR admission. CSW has discussed this option with rehab admissions coordinator and it looks like CIR admission is likely. CSW will sign off at this time. Please re-consult if SNF placement is necessary.  Liz Beach MSW, [place], [place], [phone]

## 2014-12-02 NOTE — Progress Notes (Signed)
[facility] PROGRESS NOTE  Jeffrey [name] MCN:[id number] DOB: 21-Dec-1932 DOA: 11/27/2014 PCP: Vikki Ports, MD  Assessment/Plan: 1-septic shock/sepsis due to RMSF -sepsis and shock features essentially resolved -will continue doxycycline to complete a total of 2 weeks total of therapy (6/14) -continue supportive care  2-encephalopathy: slowly resolving and improving  -multifactorial: RMSF, stroke, sundowning -will continue tx for antibiotics -continue PT/OT/SPT -continue constant reorientation  -much better today, will hold on seroquel  3-VDRF and presumed PNA -breathing laborious today and with tachypnea -no fever and with CXR demonstrating pulmonary edema -after discussing with ID, will hold on adding zosyn and will continue tx with doxycycline, lasix and lungs hygiene   -received 3 days of vanc/zosyn around intubation -most likely aspiration/pneumonitis -will start flutter valve  4-Acute Rt frontal CVA with high grade Lt ICA stenosis. -continue ASA, statin and risk factor modifications -appreciate neurology assistance and rec's -patient to follow as an outpatient for further decision treatment on left ICA stenosis  5-positive blood cx: 1/2 coagulase neg staph -appears to be a contaminant -will follow rec's from ID -no need of IV abx's for that at this moment  6-PAF: transient and resolved now -given positive stroke (CHADs VASC score is at least 5) -will need anticoagulation most likely -per cardiology, will due event monitoring if not further  A. Fib appreciated while inpatient; if no abnormalities on event monitoring, will need loop recorder -continue ASA for now -concerns for falls at this moment (given confusion and severe deconditioning)  7-acute on chronic combined systolic/diastolic heart failure: EF 40-45% -will monitor daily weights -follow strict intake and output -patient started on metoprolol and lisinopril -advise to follow low sodium diet -will start  lasix 40mg  BID (vascular congestion and pulmonary edema seen on CXR) -cardiology consulted given mild troponin elevation and hypokinesis on Echo; plan is for outpatient stress test -will follow rec's -continue ASA and statins  8-HLD: continue statins  9-severe deconditioning and falls risk: continue PT/OT/SPT -patient will need either CIR vs SNF   Code Status: Full Family Communication: son at bedside Disposition Plan: to be determine, CIR vs SNF   Consultants:  PCCM  Cardiology  Neurology  ID  Procedures:  See below for x-ray reports   2D Echo EF 40-45%. No cardiac source of embolism identified.  Carotid Doppler findings: Right = 1-39% ICA stenosis. Left = 80-99% ICA stenosis.  EEG 11/29/2014 - EEG is abnormal due to moderate diffuse slowing of the waking background.  Antibiotics:  Doxycycline   HPI/Subjective: Patient is afebrile, denies CP, nausea, vomiting and abd pain. Patient with increase SOB and tachypnea. Improvement on orientation appreciated.  Objective: Filed Vitals:   12/02/14 2259  BP: 142/86  Pulse: 52  Temp: 97.7 F (36.5 C)  Resp: 36    Intake/Output Summary (Last 24 hours) at 12/02/14 2337 Last data filed at 12/02/14 2300  Gross per 24 hour  Intake    560 ml  Output   1400 ml  Net   -840 ml   Filed Weights   11/27/14 1816 11/28/14 1853  Weight: 78.019 kg (172 lb) 78.9 kg (173 lb 15.1 oz)    Exam:   General:  More oriented and feeling better overall. Still severely deconditioned and today with increased SOB and tachypnea. No CP, no fever.   Cardiovascular: positive murmur, no rubs or gallops, rate controlled. No frank JVD  Respiratory: mild end exp wheezing, bibasilar crackles  Abdomen: soft, NT, ND, positive BS  Musculoskeletal: trace edema LE bilaterally, no cyanosis  Data Reviewed: Basic Metabolic Panel:  Recent Labs Lab 11/27/14 1829 11/28/14 0020 11/28/14 2118 11/29/14 0433 11/30/14 0345  NA 132* 132* 135 137  136  K 4.1 3.6 4.1 3.9 3.6  CL 97* 97* 104 107 105  CO2 22 24 22 22 22   GLUCOSE 167* 180* 164* 128* 126*  BUN 31* 26* 24* 28* 23*  CREATININE 1.07 1.05 1.06 1.01 0.89  CALCIUM 8.2* 7.6* 7.7* 7.6* 7.9*  MG  --   --   --   --  2.0  PHOS  --   --   --   --  3.3   Liver Function Tests:  Recent Labs Lab 11/27/14 1829 11/28/14 0020 11/28/14 2118  AST 84* 64* 56*  ALT 48 40 31  ALKPHOS 53 49 44  BILITOT 1.0 0.7 1.1  PROT 6.7 5.6* 5.5*  ALBUMIN 3.2* 2.6* 2.3*    Recent Labs Lab 11/28/14 1708  AMMONIA 13   CBC:  Recent Labs Lab 11/27/14 1829 11/28/14 0020 11/28/14 2118 11/29/14 0433 11/30/14 0345  WBC 7.8 7.5 8.9 12.6* 14.9*  NEUTROABS 6.7  --   --   --   --   HGB 13.0 11.1* 10.7* 9.9* 10.0*  HCT 37.8* 33.1* 31.5* 30.1* 30.1*  MCV 96.2 95.9 95.2 96.5 96.2  PLT 94* 83* 82* 112* 146*   Cardiac Enzymes:  Recent Labs Lab 11/28/14 1708 11/28/14 2118 11/29/14 0433  TROPONINI 0.05* 0.10* 0.07*   CBG:  Recent Labs Lab 11/29/14 2358 11/30/14 0342 11/30/14 0758 11/30/14 1144 11/30/14 1541  GLUCAP 152* 126* 107* 169* 154*    Recent Results (from the past 240 hour(s))  Urine culture     Status: None   Collection Time: 11/25/14  3:33 PM  Result Value Ref Range Status   Colony Count >=100,000 COLONIES/ML  Final   Organism ID, Bacteria Multiple bacterial morphotypes present, none  Final   Organism ID, Bacteria predominant. Suggest appropriate recollection if   Final   Organism ID, Bacteria clinically indicated.  Final  Culture, blood (single)     Status: None   Collection Time: 11/25/14  4:00 PM  Result Value Ref Range Status   Culture STAPHYLOCOCCUS SPECIES (COAGULASE NEGATIVE)  Final   Organism ID, Bacteria STAPHYLOCOCCUS SPECIES (COAGULASE NEGATIVE)  Final    Comment: The significance of isolating this organism from a single venipuncture cannot be predicted without further clinical and culture correlation Susceptibilities available only on  request. Report Faxed by Request Gram Stain Report Called to,Read Back By and Verified With: [facility] SCALES 11/26/14 1520 BY SMITHERSJ       Susceptibility   Staphylococcus species (coagulase negative) -  (no method available)    PENICILLIN >=0.5 Resistant     OXACILLIN <=0.25 Sensitive     CEFAZOLIN  Sensitive     GENTAMICIN <=0.5 Sensitive     CIPROFLOXACIN <=0.5 Sensitive     LEVOFLOXACIN 0.25 Sensitive     TRIMETH/SULFA <=10 Sensitive     VANCOMYCIN <=0.5 Sensitive     CLINDAMYCIN <=0.25 Sensitive     ERYTHROMYCIN <=0.25 Sensitive     RIFAMPIN <=0.5 Sensitive     TETRACYCLINE <=1 Sensitive   Culture, blood (routine x 2)     Status: None   Collection Time: 11/27/14  6:31 PM  Result Value Ref Range Status   Specimen Description BLOOD LEFT FOREARM  Final   Special Requests BOTTLES DRAWN AEROBIC AND ANAEROBIC 5CC  Final   Culture NO GROWTH 5 DAYS  Final  Report Status 12/02/2014 FINAL  Final  Urine culture     Status: None   Collection Time: 11/27/14  7:44 PM  Result Value Ref Range Status   Specimen Description URINE, RANDOM  Final   Special Requests NONE  Final   Culture NO GROWTH 2 DAYS  Final   Report Status 11/29/2014 FINAL  Final  Culture, blood (routine x 2)     Status: None   Collection Time: 11/27/14  8:00 PM  Result Value Ref Range Status   Specimen Description BLOOD RIGHT FOREARM  Final   Special Requests BOTTLES DRAWN AEROBIC AND ANAEROBIC 5ML  Final   Culture NO GROWTH 5 DAYS  Final   Report Status 12/02/2014 FINAL  Final  CSF culture with Stat gram stain     Status: None   Collection Time: 11/28/14  1:10 PM  Result Value Ref Range Status   Specimen Description CSF  Final   Special Requests TUBE 2  Final   Gram Stain   Final    CYTOSPIN SMEAR WBC PRESENT,BOTH PMN AND MONONUCLEAR NO ORGANISMS SEEN    Culture NO GROWTH 3 DAYS  Final   Report Status 12/01/2014 FINAL  Final  MRSA PCR Screening     Status: None   Collection Time: 11/28/14  6:56 PM   Result Value Ref Range Status   MRSA by PCR NEGATIVE NEGATIVE Final    Comment:        The GeneXpert MRSA Assay (FDA approved for NASAL specimens only), is one component of a comprehensive MRSA colonization surveillance program. It is not intended to diagnose MRSA infection nor to guide or monitor treatment for MRSA infections.   Culture, respiratory (NON-Expectorated)     Status: None   Collection Time: 11/28/14  8:16 PM  Result Value Ref Range Status   Specimen Description TRACHEAL ASPIRATE  Final   Special Requests NONE  Final   Gram Stain   Final    RARE WBC PRESENT, PREDOMINANTLY MONONUCLEAR RARE SQUAMOUS EPITHELIAL CELLS PRESENT NO ORGANISMS SEEN Performed at [facility]    Culture   Final    Non-Pathogenic Oropharyngeal-type Flora Isolated. Performed at [facility]    Report Status 12/01/2014 FINAL  Final     Studies: Dg Chest Port 1 View  12/02/2014   CLINICAL DATA:  Increasing shortness of breath. Hospital admission for fever.  EXAM: PORTABLE CHEST - 1 VIEW  COMPARISON:  11/29/2014.  11/25/2014.  FINDINGS: Low volume chest. This accentuates the size of the cardiopericardial silhouette which is upper limits of normal for projection.  Monitoring leads project over the chest.  LEFT perihilar airspace disease is present. This may represent asymmetric pulmonary edema or pneumonia.  Increased density is present over the RIGHT scapula, favored to be due to bony overlap rather than airspace disease.  IMPRESSION: New LEFT mid lung airspace disease around the LEFT hilum suspicious for pneumonia. Asymmetric/ atypical pulmonary edema less likely. Followup PA and lateral chest X-ray is recommended in 3-4 weeks following trial of antibiotic therapy to ensure resolution and exclude underlying malignancy.   Electronically Signed   By: Dereck Ligas M.D.   On: 12/02/2014 11:28    Scheduled Meds: . antiseptic oral rinse  7 mL Mouth Rinse QID  . aspirin  81 mg Oral  Daily  . budesonide (PULMICORT) nebulizer solution  0.25 mg Nebulization BID  . chlorhexidine  15 mL Mouth Rinse BID  . doxycycline  100 mg Oral Q12H  . furosemide  40  mg Intravenous BID  . lisinopril  20 mg Oral Daily  . metoprolol succinate  25 mg Oral BID WC  . pravastatin  40 mg Oral q1800  . sodium chloride  3 mL Intravenous Q12H   Continuous Infusions:   Principal Problem:   Fever Active Problems:   Essential hypertension, benign   Pure hypercholesterolemia   Prostate cancer   Type 2 diabetes mellitus without complication   Murmur   TIA (transient ischemic attack)   Aortic atherosclerosis   Sepsis   Acute encephalopathy   Thrombocytopenia   AKI (acute kidney injury)   Severe sepsis   Respiratory distress   Pyrexia   Lacunar infarct, acute   RMSF [facility] spotted fever)   Acute CVA (cerebrovascular accident)    Time spent: 33 minutes    Barton Dubois  [facility] Pager [phone]. If 7PM-7AM, please contact night-coverage at [email or web address], password [facility] 12/02/2014, 11:37 PM  LOS: 5 days

## 2014-12-02 NOTE — Progress Notes (Signed)
Patient Name: Jeffrey [name]      SUBJECTIVE:  81 yo admitted with DOE and MS changes  >>a history of hypertension, hyperlipidemia, TIA, mild aortic stenosis, and possibly diabetes mellitus (although not on hypoglycemic agents, HbA1C 6.3% on 7/1) Dx with RMSF and course complicated by respiratory failure requiring intubation   Imaging>> acute infarcts    Seen in consult 7/3 for afib, + Troponin(0.05-0.10);  Tele >>  Echo 9/12  EF 55-60%   Echo 6/16  EF 40-45% with mild decrease RV function  Noted to have L ICA stenosis ( 80-99%)      Past Medical History  Diagnosis Date  . Hypertension   . Hypercholesteremia   . Impaired fasting glucose   . Prostate cancer radiation + seeding implant (Dr.[name])  . Rotator cuff tear, right 10/2010    supraspinatous and infraspinatous  . Diabetes mellitus   . Macular degeneration   . Heart murmur     mild aortic stenosis  . Radiation proctitis 11/2012    treated with APC ablation and Canasa suppositories (Dr. Hilarie Fredrickson)  . TIA (transient ischemic attack) 10/02/13    hosp at [facility] x 1 night  . 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 Aortic atherosclerosis 11/25/2014  . Acute encephalopathy 11/27/2014  . RMSF [facility] spotted fever) 10/2014    (hosp with FUO, encephalopathy)  . Multiple lacunar infarcts   . Acute CVA (cerebrovascular accident)     Scheduled Meds:  Scheduled Meds: . antiseptic oral rinse  7 mL Mouth Rinse QID  . aspirin  81 mg Oral Daily  . chlorhexidine  15 mL Mouth Rinse BID  . doxycycline  100 mg Oral Q12H  . lisinopril  20 mg Oral Daily  . metoprolol succinate  25 mg Oral BID WC  . pravastatin  40 mg Oral q1800  . sodium chloride  3 mL Intravenous Q12H   Continuous Infusions:  hydrocortisone cream, ipratropium-albuterol, [DISCONTINUED] ondansetron **OR** ondansetron (ZOFRAN) IV, white petrolatum    PHYSICAL EXAM Filed Vitals:   12/01/14 2110 12/01/14 2148  12/02/14 0120 12/02/14 0442  BP: 159/81  144/70 158/76  Pulse: 104  55 55  Temp: 97.6 F (36.4 C)  99.2 F (37.3 C) 99 F (37.2 C)  TempSrc: Oral  Oral Oral  Resp: 41  20 20  Height:      Weight:      SpO2: 97% 98% 95% 97%    General appearance: cooperative and moderate distress Neck: JVD - 9 cm above sternal notch +HJR , no adenopathy, no carotid bruit  supple, symmetrical, trachea midline and thyroid not enlarged, symmetric, no tenderness/mass/nodules Lungs: bialteral rales with some egophony R base Heart: regularly irregular rhythm Abdomen: soft, non-tender; bowel sounds normal; no masses,  no organomegaly Extremities: edema scant Skin: Skin color, texture, turgor normal. No rashes or lesions Neurologic: Grossly normal dysartheri  TELEMETRY: Reviewed telemetry pt in Sinus with freq PACs :    Intake/Output Summary (Last 24 hours) at 12/02/14 0832 Last data filed at 12/02/14 0803  Gross per 24 hour  Intake    580 ml  Output   1150 ml  Net   -570 ml    LABS: Basic Metabolic Panel:  Recent Labs Lab 11/27/14 1829 11/28/14 0020 11/28/14 2118 11/29/14 0433 11/30/14 0345  NA 132* 132* 135 137 136  K 4.1 3.6 4.1 3.9 3.6  CL 97* 97* 104 107 105  CO2 22 24 22  22  22  GLUCOSE 167* 180* 164* 128* 126*  BUN 31* 26* 24* 28* 23*  CREATININE 1.07 1.05 1.06 1.01 0.89  CALCIUM 8.2* 7.6* 7.7* 7.6* 7.9*  MG  --   --   --   --  2.0  PHOS  --   --   --   --  3.3   Cardiac Enzymes: No results for input(s): CKTOTAL, CKMB, CKMBINDEX, TROPONINI in the last 72 hours. CBC:  Recent Labs Lab 11/25/14 1600 11/27/14 1829 11/28/14 0020 11/28/14 2118 11/29/14 0433 11/30/14 0345  WBC 5.3 7.8 7.5 8.9 12.6* 14.9*  NEUTROABS 4.8 6.7  --   --   --   --   HGB 13.3 13.0 11.1* 10.7* 9.9* 10.0*  HCT 39.8 37.8* 33.1* 31.5* 30.1* 30.1*  MCV 95.9 96.2 95.9 95.2 96.5 96.2  PLT 137* 94* 83* 82* 112* 146*   Fasting Lipid Panel:  Recent Labs  11/30/14 0345  CHOL 104  HDL 11*  LDLCALC  40  TRIG 266*  CHOLHDL 9.5       ASSESSMENT AND PLAN:  Principal Problem:   Fever Active Problems:   Essential hypertension, benign   Pure hypercholesterolemia   Prostate cancer   Type 2 diabetes mellitus without complication   Murmur   TIA (transient ischemic attack)   Aortic atherosclerosis   Sepsis   Acute encephalopathy   Thrombocytopenia   AKI (acute kidney injury)   Severe sepsis   Respiratory distress   Pyrexia   Lacunar infarct, acute   RMSF [facility] spotted fever)   Acute CVA (cerebrovascular accident)   Acute issue is dyspnea which family says is worse and is quite notable with RR >40 I worry about him tiring  Will diurese and repeat CXR   He feels warm to touch and will repeat temp  Have spoken with primary service  No definitive Afib-- freq ectopy, perhaps some MAT  Would continue ASA and monitoring, discharge with 30 day monitor if no afib dectected in house, with high pretest prob making ILR perhaps not the right first choice of monitoring  If 30 day recorder were negative, would implant LINQ  Reviweed with family  L ICA stenosis, will need outpt evaluation     Signed, Virl Axe MD  12/02/2014

## 2014-12-03 ENCOUNTER — Inpatient Hospital Stay (HOSPITAL_COMMUNITY)
Admission: RE | Admit: 2014-12-03 | Discharge: 2014-12-10 | DRG: 064 | Disposition: A | Discharge status: Home / Self Care | Payer: Medicare Other | Source: Intra-hospital | Attending: Physical Medicine & Rehabilitation | Admitting: Physical Medicine & Rehabilitation

## 2014-12-03 ENCOUNTER — Other Ambulatory Visit: Payer: Self-pay | Admitting: Physician Assistant

## 2014-12-03 ENCOUNTER — Inpatient Hospital Stay (INDEPENDENT_AMBULATORY_CARE_PROVIDER_SITE_OTHER): Payer: Medicare Other

## 2014-12-03 DIAGNOSIS — A770 Spotted fever due to Rickettsia rickettsii: Secondary | ICD-10-CM | POA: Diagnosis present

## 2014-12-03 DIAGNOSIS — I639 Cerebral infarction, unspecified: Secondary | ICD-10-CM

## 2014-12-03 DIAGNOSIS — I6350 Cerebral infarction due to unspecified occlusion or stenosis of unspecified cerebral artery: Secondary | ICD-10-CM | POA: Diagnosis not present

## 2014-12-03 DIAGNOSIS — I480 Paroxysmal atrial fibrillation: Secondary | ICD-10-CM | POA: Diagnosis not present

## 2014-12-03 DIAGNOSIS — Z09 Encounter for follow-up examination after completed treatment for conditions other than malignant neoplasm: Secondary | ICD-10-CM

## 2014-12-03 DIAGNOSIS — I5043 Acute on chronic combined systolic (congestive) and diastolic (congestive) heart failure: Secondary | ICD-10-CM | POA: Diagnosis present

## 2014-12-03 DIAGNOSIS — E119 Type 2 diabetes mellitus without complications: Secondary | ICD-10-CM

## 2014-12-03 DIAGNOSIS — E876 Hypokalemia: Secondary | ICD-10-CM | POA: Diagnosis present

## 2014-12-03 DIAGNOSIS — E1169 Type 2 diabetes mellitus with other specified complication: Secondary | ICD-10-CM

## 2014-12-03 DIAGNOSIS — I491 Atrial premature depolarization: Secondary | ICD-10-CM

## 2014-12-03 DIAGNOSIS — D696 Thrombocytopenia, unspecified: Secondary | ICD-10-CM | POA: Diagnosis present

## 2014-12-03 DIAGNOSIS — I10 Essential (primary) hypertension: Secondary | ICD-10-CM | POA: Diagnosis present

## 2014-12-03 DIAGNOSIS — R509 Fever, unspecified: Secondary | ICD-10-CM | POA: Diagnosis not present

## 2014-12-03 DIAGNOSIS — G9340 Encephalopathy, unspecified: Secondary | ICD-10-CM | POA: Diagnosis present

## 2014-12-03 DIAGNOSIS — T502X5A Adverse effect of carbonic-anhydrase inhibitors, benzothiadiazides and other diuretics, initial encounter: Secondary | ICD-10-CM | POA: Diagnosis present

## 2014-12-03 DIAGNOSIS — R7309 Other abnormal glucose: Secondary | ICD-10-CM

## 2014-12-03 DIAGNOSIS — E7800 Pure hypercholesterolemia, unspecified: Secondary | ICD-10-CM

## 2014-12-03 DIAGNOSIS — N289 Disorder of kidney and ureter, unspecified: Secondary | ICD-10-CM | POA: Diagnosis present

## 2014-12-03 DIAGNOSIS — G458 Other transient cerebral ischemic attacks and related syndromes: Secondary | ICD-10-CM

## 2014-12-03 DIAGNOSIS — R7303 Prediabetes: Secondary | ICD-10-CM | POA: Insufficient documentation

## 2014-12-03 LAB — CBC
HEMATOCRIT: 32.4 % — AB (ref 39.0–52.0)
HEMOGLOBIN: 10.7 g/dL — AB (ref 13.0–17.0)
MCH: 32 pg (ref 26.0–34.0)
MCHC: 33 g/dL (ref 30.0–36.0)
MCV: 97 fL (ref 78.0–100.0)
Platelets: 390 10*3/uL (ref 150–400)
RBC: 3.34 MIL/uL — AB (ref 4.22–5.81)
RDW: 13.4 % (ref 11.5–15.5)
WBC: 14.9 10*3/uL — ABNORMAL HIGH (ref 4.0–10.5)

## 2014-12-03 LAB — BASIC METABOLIC PANEL
Anion gap: 12 (ref 5–15)
BUN: 23 mg/dL — AB (ref 6–20)
CO2: 27 mmol/L (ref 22–32)
Calcium: 8.5 mg/dL — ABNORMAL LOW (ref 8.9–10.3)
Chloride: 103 mmol/L (ref 101–111)
Creatinine, Ser: 0.91 mg/dL (ref 0.61–1.24)
GFR calc Af Amer: >60 mL/min (ref >60)
GFR calc non Af Amer: >60 mL/min (ref >60)
Glucose, Bld: 147 mg/dL — ABNORMAL HIGH (ref 65–99)
Potassium: 3.2 mmol/L — ABNORMAL LOW (ref 3.5–5.1)
SODIUM: 142 mmol/L (ref 135–145)

## 2014-12-03 LAB — HEMOGLOBIN A1C
HEMOGLOBIN A1C: 6.2 % — AB (ref 4.8–5.6)
MEAN PLASMA GLUCOSE: 131 mg/dL

## 2014-12-03 MED ORDER — WHITE PETROLATUM GEL: Status: DC | PRN

## 2014-12-03 MED ORDER — ONDANSETRON HCL 4 MG PO TABS: 4.0000 mg | ORAL_TABLET | Freq: Four times a day (QID) | ORAL | Status: DC | PRN

## 2014-12-03 MED ORDER — ONDANSETRON HCL 4 MG/2ML IJ SOLN: 4.0000 mg | Freq: Four times a day (QID) | INTRAMUSCULAR | Status: DC | PRN

## 2014-12-03 MED ORDER — ACETAMINOPHEN 325 MG PO TABS: 325.0000 mg | ORAL_TABLET | ORAL | Status: DC | PRN

## 2014-12-03 MED ORDER — TRAZODONE HCL 50 MG PO TABS
25.0000 mg | ORAL_TABLET | Freq: Every evening | ORAL | Status: DC | PRN
Start: 2014-12-03 — End: 2014-12-10
  Administered 2014-12-03 – 2014-12-04 (×2): 25 mg via ORAL
  Administered 2014-12-05: 50 mg via ORAL
  Filled 2014-12-03 (×3): qty 1

## 2014-12-03 MED ORDER — OCUVITE-LUTEIN PO CAPS
1.0000 | ORAL_CAPSULE | Freq: Every day | ORAL | Status: DC
  Administered 2014-12-03: 1 via ORAL
  Filled 2014-12-03: qty 1

## 2014-12-03 MED ORDER — DIPHENHYDRAMINE HCL 12.5 MG/5ML PO ELIX: 12.5000 mg | ORAL_SOLUTION | Freq: Four times a day (QID) | ORAL | Status: DC | PRN

## 2014-12-03 MED ORDER — METOPROLOL SUCCINATE ER 50 MG PO TB24: 50.0000 mg | ORAL_TABLET | Freq: Two times a day (BID) | ORAL | Status: DC

## 2014-12-03 MED ORDER — HYDROCERIN EX CREA
TOPICAL_CREAM | Freq: Two times a day (BID) | CUTANEOUS | Status: DC
  Administered 2014-12-03 – 2014-12-10 (×11): via TOPICAL
  Filled 2014-12-03 (×2): qty 113

## 2014-12-03 MED ORDER — IPRATROPIUM-ALBUTEROL 0.5-2.5 (3) MG/3ML IN SOLN: 3.0000 mL | Freq: Four times a day (QID) | RESPIRATORY_TRACT | Status: DC | PRN

## 2014-12-03 MED ORDER — OCUVITE-LUTEIN PO CAPS
1.0000 | ORAL_CAPSULE | Freq: Every day | ORAL | Status: DC
  Administered 2014-12-04: 1 via ORAL
  Filled 2014-12-03 (×4): qty 1

## 2014-12-03 MED ORDER — POLYETHYLENE GLYCOL 3350 17 G PO PACK
17.0000 g | PACK | Freq: Every day | ORAL | Status: DC | PRN
  Filled 2014-12-03: qty 1

## 2014-12-03 MED ORDER — WHITE PETROLATUM GEL: Status: DC

## 2014-12-03 MED ORDER — ALUM & MAG HYDROXIDE-SIMETH 200-200-20 MG/5ML PO SUSP: 30.0000 mL | ORAL | Status: DC | PRN

## 2014-12-03 MED ORDER — METOPROLOL SUCCINATE ER 50 MG PO TB24
50.0000 mg | ORAL_TABLET | Freq: Two times a day (BID) | ORAL | Status: DC
  Administered 2014-12-03: 50 mg via ORAL

## 2014-12-03 MED ORDER — FUROSEMIDE 40 MG PO TABS
40.0000 mg | ORAL_TABLET | Freq: Every day | ORAL | Status: DC
  Administered 2014-12-04: 40 mg via ORAL
  Filled 2014-12-03 (×3): qty 1

## 2014-12-03 MED ORDER — DOXYCYCLINE HYCLATE 100 MG PO TABS
100.0000 mg | ORAL_TABLET | Freq: Two times a day (BID) | ORAL | Status: DC
  Administered 2014-12-03 – 2014-12-04 (×3): 100 mg via ORAL
  Filled 2014-12-03 (×6): qty 1

## 2014-12-03 MED ORDER — POTASSIUM CHLORIDE CRYS ER 20 MEQ PO TBCR
40.0000 meq | EXTENDED_RELEASE_TABLET | Freq: Once | ORAL | Status: AC
  Administered 2014-12-03: 40 meq via ORAL
  Filled 2014-12-03: qty 2

## 2014-12-03 MED ORDER — FUROSEMIDE 40 MG PO TABS: 40.0000 mg | ORAL_TABLET | Freq: Every day | ORAL | Status: DC

## 2014-12-03 MED ORDER — LISINOPRIL 20 MG PO TABS
20.0000 mg | ORAL_TABLET | Freq: Every day | ORAL | Status: DC
  Administered 2014-12-04 – 2014-12-10 (×7): 20 mg via ORAL
  Filled 2014-12-03 (×8): qty 1

## 2014-12-03 MED ORDER — CHLORHEXIDINE GLUCONATE 0.12 % MT SOLN
15.0000 mL | Freq: Two times a day (BID) | OROMUCOSAL | Status: DC
  Administered 2014-12-03 – 2014-12-10 (×13): 15 mL via OROMUCOSAL
  Filled 2014-12-03 (×17): qty 15

## 2014-12-03 MED ORDER — FUROSEMIDE 40 MG PO TABS
40.0000 mg | ORAL_TABLET | Freq: Every day | ORAL | Status: DC
  Administered 2014-12-03: 40 mg via ORAL
  Filled 2014-12-03: qty 1

## 2014-12-03 MED ORDER — METOPROLOL SUCCINATE ER 50 MG PO TB24
50.0000 mg | ORAL_TABLET | Freq: Two times a day (BID) | ORAL | Status: DC
  Administered 2014-12-04 – 2014-12-10 (×13): 50 mg via ORAL
  Filled 2014-12-03 (×15): qty 1

## 2014-12-03 MED ORDER — BISACODYL 10 MG RE SUPP: 10.0000 mg | Freq: Every day | RECTAL | Status: DC | PRN

## 2014-12-03 MED ORDER — CETYLPYRIDINIUM CHLORIDE 0.05 % MT LIQD
7.0000 mL | Freq: Four times a day (QID) | OROMUCOSAL | Status: DC
  Administered 2014-12-04 – 2014-12-09 (×12): 7 mL via OROMUCOSAL

## 2014-12-03 MED ORDER — ASPIRIN 81 MG PO CHEW
81.0000 mg | CHEWABLE_TABLET | Freq: Every day | ORAL | Status: DC
  Administered 2014-12-04 – 2014-12-10 (×7): 81 mg via ORAL
  Filled 2014-12-03 (×7): qty 1

## 2014-12-03 MED ORDER — DOXYCYCLINE HYCLATE 100 MG PO TABS: 100.0000 mg | ORAL_TABLET | Freq: Two times a day (BID) | ORAL | Status: DC

## 2014-12-03 MED ORDER — GUAIFENESIN-DM 100-10 MG/5ML PO SYRP: 5.0000 mL | ORAL_SOLUTION | Freq: Four times a day (QID) | ORAL | Status: DC | PRN

## 2014-12-03 MED ORDER — ENSURE ENLIVE PO LIQD: 237.0000 mL | Freq: Two times a day (BID) | ORAL | Status: DC

## 2014-12-03 MED ORDER — PRESERVISION AREDS PO CAPS: 1.0000 | ORAL_CAPSULE | Freq: Every morning | ORAL | Status: DC

## 2014-12-03 MED ORDER — HYDROCORTISONE 1 % EX CREA
1.0000 "application " | TOPICAL_CREAM | CUTANEOUS | Status: DC | PRN
  Filled 2014-12-03: qty 28

## 2014-12-03 MED ORDER — PRAVASTATIN SODIUM 40 MG PO TABS
40.0000 mg | ORAL_TABLET | Freq: Every day | ORAL | Status: DC
  Administered 2014-12-04: 40 mg via ORAL
  Filled 2014-12-03 (×2): qty 1

## 2014-12-03 MED ORDER — BUDESONIDE 0.25 MG/2ML IN SUSP: 0.2500 mg | Freq: Two times a day (BID) | RESPIRATORY_TRACT | Status: DC

## 2014-12-03 MED ORDER — LISINOPRIL 20 MG PO TABS: 20.0000 mg | ORAL_TABLET | Freq: Every day | ORAL | Status: DC

## 2014-12-03 MED ORDER — PRO-STAT 64 PO LIQD
30.0000 mL | Freq: Three times a day (TID) | ORAL | Status: DC
  Administered 2014-12-04 – 2014-12-10 (×14): 30 mL via ORAL
  Filled 2014-12-03 (×7): qty 30

## 2014-12-03 MED ORDER — BUDESONIDE 0.25 MG/2ML IN SUSP
0.2500 mg | Freq: Two times a day (BID) | RESPIRATORY_TRACT | Status: DC
  Administered 2014-12-04 – 2014-12-10 (×12): 0.25 mg via RESPIRATORY_TRACT
  Filled 2014-12-03 (×16): qty 2

## 2014-12-03 MED ORDER — ENOXAPARIN SODIUM 40 MG/0.4ML ~~LOC~~ SOLN
40.0000 mg | SUBCUTANEOUS | Status: DC
  Administered 2014-12-03 – 2014-12-09 (×7): 40 mg via SUBCUTANEOUS
  Filled 2014-12-03 (×8): qty 0.4

## 2014-12-03 MED ORDER — FLEET ENEMA 7-19 GM/118ML RE ENEM: 1.0000 | ENEMA | Freq: Once | RECTAL | Status: AC | PRN

## 2014-12-03 MED ORDER — POTASSIUM CHLORIDE CRYS ER 10 MEQ PO TBCR
10.0000 meq | EXTENDED_RELEASE_TABLET | Freq: Two times a day (BID) | ORAL | Status: DC
  Filled 2014-12-03 (×3): qty 1

## 2014-12-03 NOTE — PMR Pre-admission (Signed)
PMR Admission Coordinator Pre-Admission Assessment  Patient: Jeffrey [name] is an 81 y.o., male MRN: [id number] DOB: 13-Jul-1932 Height: 5\' 11"  (180.3 cm) Weight: 78.9 kg (173 lb 15.1 oz)              Insurance Information HMO: No    PPO:       PCP:       IPA:       80/20:       OTHER:   PRIMARY: Medicare A/B      Policy#: [id number]      Subscriber: Inda Coke CM Name:        Phone#:       Fax#:   Pre-Cert#:        Employer: Retired Benefits:  Phone #:       Name: Checked in [place]. Date: 10/30/99     Deduct: $1288      Out of Pocket Max: None      Life Max: unlimited CIR: 100%      SNF: 100 days Outpatient: 80%     Co-Pay: 20% Home Health: 100%      Co-Pay: none DME: 80%     Co-Pay: 20% Providers: patients choice  SECONDARY: BCBS supp      Policy#: [id number]      Subscriber: Inda Coke CM Name:        Phone#:       Fax#:   Pre-Cert#:        Employer: Retired Benefits:  Phone #: [phone]     Name:   Eff. 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 RH-male with history of HTN, DM type 2, macular degeneration who was admitted on 11/27/14 with confusion,difficulty walking, fever and mild right sided weakness with concerns of encephalitis. MRI brain done revealing He was found to be anemic with thrombocytopenia, leukocytosis with work up indicating [facility] Fever. MRI brain done revealing acute nonhemorrhagic white matter infarct in the anterior right frontal lobe and LP with glucose 70 and protein111. He developed septic shock with respiratory  failure requiring intubation and was treated with broad spectrum antibiotics due to presumed PNA. He was extubated to [place] on 07/01 and swallow evaluation done with initiation of regular diet. EEG revealed moderate diffuse slowing without epileptiform discharges. Carotid dopplers with L- 80-89% ICA stenosis. 2D echo with EF 40-45% with diffuse hypokinesis and moderate calcification AV. Neurology recommends low dose ASA for secondary stroke prevention and to follow up with [facility] past discharge for asymptomatic L-ICA stenosis. Cardiology consulted 07/03 due to elevated cardiac enzymes and bigeminy with question of PAF. Dr. Caryl Comes evaluated patient and felt that patient with MAT and no definite A fib. He recommended ASA and 30 day event monitor if no A fib detected in house. Patient developed SOB with tachypnea on 07/03 pm  and CXR done revealing LML airspace disease concerning for PNA. Therapy evaluations done this weekend and CIR recommended by MD and rehab team.  Note:  Daughter is getting an event monitor from Dr. Olin Pia office and will bring it back to hospital to be put on patient.    Total: 2=NIH  Past Medical History  Past Medical History  Diagnosis Date  . Hypertension   . Hypercholesteremia   . Impaired fasting glucose   . Prostate cancer radiation + seeding implant (Dr.[name])  . Rotator cuff tear, right 10/2010    supraspinatous and infraspinatous  . Diabetes mellitus   . Macular degeneration   . Heart murmur     mild aortic stenosis  . Radiation proctitis 11/2012    treated with APC ablation and Canasa suppositories (Dr. Hilarie Fredrickson)  . TIA (transient ischemic attack) 10/02/13    hosp at [facility] x 1 night  . 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 Aortic atherosclerosis 11/25/2014  . Acute encephalopathy 11/27/2014  . 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 Multiple lacunar infarcts   . Acute CVA (cerebrovascular  accident)   . Acute on chronic combined systolic and diastolic CHF (congestive heart failure)     Family History  family history includes Cancer in his daughter; Dementia in his mother; Diabetes in his daughter and mother; Heart disease in his son; Heart disease (age of onset: 56) in his father; Stroke in his sister.  Prior Rehab/Hospitalizations: Had RTC repair 3 yrs ago and had outpatient therapy.  Has the patient had major surgery during 100 days prior to admission? No  Current Medications   Current facility-administered medications:  .  antiseptic oral rinse (CPC / CETYLPYRIDINIUM CHLORIDE 0.05%) solution 7 mL, 7 mL, Mouth Rinse, QID, Wilhelmina Mcardle, MD, 7 mL at 12/03/14 1152 .  aspirin chewable tablet 81 mg, 81 mg, Oral, Daily, Chesley Mires, MD, 81 mg at 12/03/14 1051 .  budesonide (PULMICORT) nebulizer solution 0.25 mg, 0.25 mg, Nebulization, BID, Barton Dubois, MD, 0.25 mg at 12/03/14 0800 .  chlorhexidine (PERIDEX) 0.12 % solution 15 mL, 15 mL, Mouth Rinse, BID, Wilhelmina Mcardle, MD, 15 mL at 12/03/14 0744 .  doxycycline (VIBRA-TABS) tablet 100 mg, 100 mg, Oral, Q12H, Eudelia Bunch, RPH, 100 mg at 12/03/14 1051 .  furosemide (LASIX) tablet 40 mg, 40 mg, Oral, Daily, Lelon Perla, MD, 40 mg at 12/03/14 1241 .  hydrocortisone cream 1 % 1 application, 1 application, Topical, PRN, Ivor Costa, MD .  ipratropium-albuterol (DUONEB) 0.5-2.5 (3) MG/3ML nebulizer solution 3 mL, 3 mL, Nebulization, Q6H PRN, Barton Dubois, MD .  lisinopril (PRINIVIL,ZESTRIL) tablet 20 mg, 20 mg, Oral, Daily, Barton Dubois, MD, 20 mg at 12/03/14 1051 .  metoprolol succinate (TOPROL-XL) 24 hr tablet 50 mg, 50 mg, Oral, BID WC, Lelon Perla, MD .  multivitamin-lutein (OCUVITE-LUTEIN) capsule 1 capsule, 1 capsule, Oral, Daily, Barton Dubois, MD .  [DISCONTINUED] ondansetron (ZOFRAN) tablet 4 mg, 4 mg, Oral, Q6H PRN **OR** ondansetron (ZOFRAN) injection 4 mg, 4 mg, Intravenous, Q4H PRN, Wilhelmina Mcardle, MD .   pravastatin (PRAVACHOL) tablet 40 mg, 40 mg, Oral, q1800, Chesley Mires, MD, 40 mg at 12/02/14 1812 .  sodium chloride 0.9 % injection 3 mL, 3 mL, Intravenous, Q12H, Ivor Costa, MD, 3 mL at 12/03/14 1100 .  white petrolatum (VASELINE) gel, , Topical, PRN, Barton Dubois, MD, 0.2 application at 22/29/79 1731  Patients Current Diet:  Diet regular Room service appropriate?: Yes; Fluid consistency:: Thin Diet - low sodium heart healthy  Precautions / Restrictions Precautions Precautions: Fall Restrictions Weight Bearing Restrictions: No   Has the patient had 2 or more falls or a fall with injury in the past year?No  Prior Activity Level Community (5-7x/wk): Went out daily.  Was driving.  Worked in his garden and mowed Museum/gallery exhibitions officer.  Enjoyed fishing.  Home Assistive Devices / Equipment Home Assistive Devices/Equipment: None  Prior Device Use: Indicate devices/aids used by the patient prior to current illness, exacerbation or injury? None of the above.  Did not use a device.  Was independent.  Prior Functional Level Prior Function Level of Independence: Independent Comments: Pt very active and enjoyed gardening   Self Care: Did the patient need help bathing, dressing, using the toilet or eating?  Independent  Indoor Mobility: Did the patient need assistance with walking from room to room (with or without device)? Independent  Stairs: Did the patient need assistance with internal or external stairs (with or without device)? Independent  Functional Cognition: Did the patient need help planning regular tasks such as shopping or remembering to take medications? Independent  Current Functional Level Cognition  Arousal/Alertness: Awake/alert Overall Cognitive Status: Impaired/Different from baseline Current Attention Level: Selective Orientation Level: Oriented to person, Oriented to place, Oriented to situation, Disoriented to time Following Commands: Follows one step commands inconsistently,  Follows multi-step commands inconsistently Safety/Judgement: Decreased awareness of safety General Comments: pt with delayed processing physically and verbally but appropriate with responses throughout session. ,Flat affect is a change for him per family. Gaze avoidance but performs when cued.  Attention: Selective Selective Attention: Impaired Selective Attention Impairment: Verbal basic, Functional basic Memory: Impaired Memory Impairment: Storage deficit, Retrieval deficit Awareness: Impaired Awareness Impairment: Emergent impairment Problem Solving: Impaired Problem Solving Impairment: Verbal basic, Functional basic Behaviors: Impulsive Safety/Judgment: Impaired    Extremity Assessment (includes Sensation/Coordination)  Upper Extremity Assessment: Defer to OT evaluation RUE Deficits / Details: Pt with tremor bil. UE. Rt with h/o rotator cuff tear and resultant ROM LUE Deficits / Details: Pt with tremor bil.   Lower Extremity Assessment: Generalized weakness (able to initiate mvmt )    ADLs  Overall ADL's : Needs assistance/impaired Eating/Feeding: NPO Grooming: Wash/dry hands, Minimal assistance, Standing Upper Body Bathing: Maximal assistance, Sitting Lower Body Bathing: Maximal assistance, Sit to/from stand Upper Body Dressing : Maximal assistance Lower Body Dressing: Total assistance, Sit to/from stand Toilet Transfer: Moderate assistance, BSC, Ambulation, RW Toileting- Clothing Manipulation and Hygiene: Moderate assistance, Sit to/from stand Functional mobility during ADLs: Moderate assistance, Minimal assistance General ADL Comments: Pt initially mod assist for standing and mobility secondary to increased posterior LOB.  Progressed to min assist level after standing for a short period of time and when standing at the sink to wash his hands post toileting.     Mobility  Overal bed mobility: Needs Assistance Bed Mobility: Sidelying to Sit, Rolling Rolling:  Supervision Sidelying to sit: Mod assist Supine to sit: Mod assist Sit to supine: Min assist General bed mobility comments: Pt in chair    Transfers  Overall transfer level: Needs assistance Equipment used: Rolling walker (2 wheeled) Transfers: Sit to/from Stand Sit to Stand: Mod assist Stand pivot transfers: Mod assist General transfer comment: Assist to bring hips up and for balance. Pt with posterior lean.    Ambulation / Gait / Stairs / Wheelchair Mobility  Ambulation/Gait Ambulation/Gait assistance: +2 physical assistance, Min assist Ambulation Distance (Feet): 100 Feet Assistive  device: Rolling walker (2 wheeled) General Gait Details: Assist for balance. Verbal cues to stay in walker and to assist turning walker. Gait Pattern/deviations: Step-through pattern, Decreased stride length, Drifts right/left Gait velocity: decr Gait velocity interpretation: Below normal speed for age/gender    Posture / Balance Dynamic Sitting Balance Sitting balance - Comments: able to sit EOB without UE support, kept wt more to right side Balance Overall balance assessment: Needs assistance Sitting-balance support: No upper extremity supported, Feet supported Sitting balance-Leahy Scale: Fair Sitting balance - Comments: able to sit EOB without UE support, kept wt more to right side Postural control: Posterior lean Standing balance support: Bilateral upper extremity supported Standing balance-Leahy Scale: Poor Standing balance comment: Walker and min A for static standing once posterior lean corrected.    Special needs/care consideration BiPAP/CPAPNo CPM No Continuous Drip IV No Dialysis No       Life Vest No Oxygen Currently has 02 [place].  Did not use 02 at home Special Bed No Trach Size No Wound Vac (area) No     Skin Has dry skin and ankles itch                              Bowel mgmt: Had BM this am 07/05/6 Bladder mgmt: Voiding in urinal with incontinence at times.  Has  urgency. Diabetic mgmt Was "borderline diabetic" at home, not on medications at home.    Previous Home Environment Living Arrangements: Spouse/significant other  Lives With: Spouse Available Help at Discharge: Family, Available 24 hours/day Type of Home: House Home Layout: One level Home Access: Stairs to enter Entrance Stairs-Rails: Right Entrance Stairs-Number of Steps: 3 Bathroom Shower/Tub: Chiropodist: Standard Home Care Services: No Additional Comments: family very supportive  Discharge Living Setting Plans for Discharge Living Setting: Patient's home, House, Lives with (comment) (Lives with wife and 2 dogs.) Type of Home at Discharge: House Discharge Home Layout: One level Discharge Home Access: Stairs to enter Entrance Stairs-Number of Steps: 3 Does the patient have any problems obtaining your medications?: No  Social/Family/Support Systems Patient Roles: Spouse, Parent, Other (Comment) (Has a wife, children, and grandchildren.) Contact Information: Aeson Sawyers - spouse  Anticipated Caregiver: wife  Anticipated Caregiver's Contact Information: [name] - wife (h) [phone] Ability/Limitations of Caregiver: Wife can assist. Caregiver Availability: 24/7 Discharge Plan Discussed with Primary Caregiver: Yes Is Caregiver In Agreement with Plan?: Yes Does Caregiver/Family have Issues with Lodging/Transportation while Pt is in Rehab?: No  Goals/Additional Needs Patient/Family Goal for Rehab: PT/OT supervision, ST supervision and min assist goals Expected length of stay: 16-22 days Cultural Considerations: None Dietary Needs: Regular diet, thin liquids Equipment Needs: TBD Pt/Family Agrees to Admission and willing to participate: Yes Program Orientation Provided & Reviewed with Pt/Caregiver Including Roles  & Responsibilities: Yes  Decrease burden of Care through IP rehab admission: N/A  Possible need for SNF placement upon discharge: Not  anticipated  Patient Condition: This patient's condition remains as documented in the consult dated 12/02/14, in which the Rehabilitation Physician determined and documented that the patient's condition is appropriate for intensive rehabilitative care in an inpatient rehabilitation facility. Will admit to inpatient rehab today.  Preadmission Screen Completed By:  Retta Diones, 12/03/2014 3:01 PM ______________________________________________________________________   Discussed status with Dr. Naaman Plummer on 12/03/14 at 1502 and received telephone approval for admission today.  Admission Coordinator:  Retta Diones, time1502/Date07/05/16

## 2014-12-03 NOTE — Care Management Note (Signed)
Case Management Note  Patient Details  Name: Jeffrey [name] MRN: [id number] Date of Birth: [birth year]  Subjective/Objective:  Patient is for dc to CIR today.                  Action/Plan:   Expected Discharge Date:                  Expected Discharge Plan:  [place]  In-House Referral:     Discharge planning Services  CM Consult  Post Acute Care Choice:    Choice offered to:  Patient  DME Arranged:    DME Agency:     HH Arranged:    [place] Agency:     Status of Service:  Completed, signed off  Medicare Important Message Given:  Yes-second notification given Date Medicare IM Given:    Medicare IM give by:    Date Additional Medicare IM Given:    Additional Medicare Important Message give by:     If discussed at [place] of Stay Meetings, dates discussed:    Additional Comments:  Zenon Mayo, RN 12/03/2014, 3:17 PM

## 2014-12-03 NOTE — Progress Notes (Signed)
Patient transferred to Inpatient Rehab by MD order. Alert and oriented x3 with family at bedside, patient in no acute distress, no complaints. RN called report to Inpatient Rehab.

## 2014-12-03 NOTE — Progress Notes (Signed)
30 day event monitor arranged, discussed with daughter who will pick it up around 4:30pm today and bring it back to place on patient.  Hilbert Corrigan PA Pager: [phone]

## 2014-12-03 NOTE — Progress Notes (Signed)
Occupational Therapy Treatment Patient Details Name: Jeffrey [name] MRN: [id number] DOB: 1932/10/19 Today's Date: 12/03/2014    History of present illness 6/29 Admitted to [facility] with 4 days of fever, malaise and 1 day AMS. Found to be thrombocytopenic. Diffuse maculopapular rash. Tick bite. Transferred to ICU evening of 6/30 with markedly worsened cognition and labored breathing. Intubated.  extubated 11/29/14. Pos for RMSF as well as CVA ant right frontal found on MRI 6/30   OT comments  Pt currently mod assist for initial standing and standing balance during toilet transfer.  Oxygen sats 91% on RA while ambulating to the bathroom with the RW as well.  Did decrease to low 80's on room air after standing at the sink to wash his hands and then ambulating to the bedside chair.  Increased back to 94% in less than one minute of rest.  Plans to transfer to CIR later today which is very appropriate for his progression back to modified independent level.     Follow Up Recommendations  CIR    Equipment Recommendations  3 in 1 bedside comode;Tub/shower seat    Recommendations for Other Services Rehab consult    Precautions / Restrictions Precautions Precautions: Fall Restrictions Weight Bearing Restrictions: No       Mobility Bed Mobility                  Transfers Overall transfer level: Needs assistance Equipment used: Rolling walker (2 wheeled)   Sit to Stand: Mod assist Stand pivot transfers: Mod assist       General transfer comment: Pt with increased posterior lean in standing resulting in LOB and the need for mod facilitation to maintain balance.    Balance Overall balance assessment: Needs assistance   Sitting balance-Leahy Scale: Fair       Standing balance-Leahy Scale: Poor                     ADL Overall ADL's : Needs assistance/impaired     Grooming: Wash/dry hands;Minimal assistance;Standing                   Toilet Transfer: Moderate  assistance;BSC;Ambulation;RW   Toileting- Clothing Manipulation and Hygiene: Moderate assistance;Sit to/from stand       Functional mobility during ADLs: Moderate assistance;Minimal assistance General ADL Comments: Pt initially mod assist for standing and mobility secondary to increased posterior LOB.  Progressed to min assist level after standing for a short period of time and when standing at the sink to wash his hands post toileting.                 Cognition   Behavior During Therapy: Flat affect   Area of Impairment: Memory;Safety/judgement;Awareness     Memory: Decreased short-term memory  Following Commands: Follows one step commands inconsistently;Follows multi-step commands inconsistently Safety/Judgement: Decreased awareness of safety Awareness: Intellectual Problem Solving: Slow processing;Difficulty sequencing;Requires verbal cues                   Pertinent Vitals/ Pain       Pain Assessment: No/denies pain         Frequency Min 2X/week     Progress Toward Goals  OT Goals(current goals can now be found in the care plan section)  Progress towards OT goals: Progressing toward goals     Plan Discharge plan remains appropriate       End of Session Equipment Utilized During Treatment: Rolling walker   Activity Tolerance Patient  tolerated treatment well   Patient Left with call bell/phone within reach;with family/visitor present;in chair;with nursing/sitter in room   Nurse Communication Mobility status        Time: 1000-1026 OT Time Calculation (min): 26 min  Charges: OT General Charges $OT Visit: 1 Procedure OT Treatments $Self Care/Home Management : 23-37 mins  Ayodele Hartsock OTR/L 12/03/2014, 10:39 AM

## 2014-12-03 NOTE — Progress Notes (Signed)
Patient Name: Jeffrey [name] Date of Encounter: 12/03/2014  Principal Problem:   Fever Active Problems:   Essential hypertension, benign   Pure hypercholesterolemia   Prostate cancer   Type 2 diabetes mellitus without complication   Murmur   TIA (transient ischemic attack)   Aortic atherosclerosis   Sepsis   Acute encephalopathy   Thrombocytopenia   AKI (acute kidney injury)   Severe sepsis   Respiratory distress   Pyrexia   Lacunar infarct, acute   RMS[facility] spotted fever)   Acute CVA (cerebrovascular accident)   Acute on chronic combined systolic and diastolic CHF (congestive heart failure)  SUBJECTIVE  Breathing improved. Denies chest pain or palpitation.   CURRENT MEDS . antiseptic oral rinse  7 mL Mouth Rinse QID  . aspirin  81 mg Oral Daily  . budesonide (PULMICORT) nebulizer solution  0.25 mg Nebulization BID  . chlorhexidine  15 mL Mouth Rinse BID  . doxycycline  100 mg Oral Q12H  . furosemide  40 mg Intravenous BID  . lisinopril  20 mg Oral Daily  . metoprolol succinate  25 mg Oral BID WC  . pravastatin  40 mg Oral q1800  . sodium chloride  3 mL Intravenous Q12H    OBJECTIVE  Filed Vitals:   12/03/14 0304 12/03/14 0643 12/03/14 0800 12/03/14 1029  BP: 161/86 161/81    Pulse: 100 98  110  Temp: 97.6 F (36.4 C) 97.6 F (36.4 C)    TempSrc: Oral Oral    Resp: 32 32    Height:      Weight:      SpO2: 98% 96% 96% 92%    Intake/Output Summary (Last 24 hours) at 12/03/14 1041 Last data filed at 12/03/14 0900  Gross per 24 hour  Intake    600 ml  Output   1450 ml  Net   -850 ml   Filed Weights   11/27/14 1816 11/28/14 1853  Weight: 172 lb (78.019 kg) 173 lb 15.1 oz (78.9 kg)    PHYSICAL EXAM  General: Pleasant, NAD. Neuro: Alert and oriented X 3. Moves all extremities spontaneously. Psych: Normal affect. HEENT:  Normal  Neck: Supple without bruits or JVD. Lungs:  Resp regular and unlabored. Bibasilar crackles Heart: irregular  rhythm. Positive murmur. Abdomen: Soft, non-tender, non-distended, BS + x 4.  Extremities: No clubbing, cyanosis or edema. DP/PT/Radials 2+ and equal bilaterally.  Accessory Clinical Findings  CBC  Recent Labs  12/03/14 0527  WBC 14.9*  HGB 10.7*  HCT 32.4*  MCV 97.0  PLT 270   Basic Metabolic Panel  Recent Labs  12/03/14 0527  NA 142  K 3.2*  CL 103  CO2 27  GLUCOSE 147*  BUN 23*  CREATININE 0.91  CALCIUM 8.5*    TELE  Irregular rate rate of 90s-110s. Occasional rate to 130s. PVCs. MAT?  Radiology/Studies Echo 6/30 LV EF: 40% -  45%  ------------------------------------------------------------------- Indications:   CVA 436. Endocarditis 421.9.  ------------------------------------------------------------------- History:  PMH:  Aortic valve disease. Transient ischemic attack. Risk factors: Former tobacco use. Hypertension. Diabetes mellitus.  ------------------------------------------------------------------- Study Conclusions  - Left ventricle: The cavity size was normal. Systolic function was mildly to moderately reduced. The estimated ejection fraction was in the range of 40% to 45%. Diffuse hypokinesis. There is severe hypokinesis of the entireapical myocardium. Doppler parameters are consistent with abnormal left ventricular relaxation (grade 1 diastolic dysfunction). - Aortic valve: Trileaflet; moderately thickened, moderately calcified leaflets. Valve mobility was restricted. Cannot exclude  vegetation. Transvalvular velocity was increased, due to stenosis. There was mild stenosis. Valve area (VTI): 1.65 cm^2. Valve area (Vmax): 1.71 cm^2. Valve area (Vmean): 1.57 cm^2. - Mitral valve: Calcified annulus. Cannot exclude vegetation. There was mild regurgitation. - Left atrium: The atrium was mildly dilated. - Right ventricle: Systolic function was mildly reduced. - Pulmonary arteries: Systolic pressure was mildly  increased. PA peak pressure: 43 mm Hg (S). - Inferior vena cava: The vessel was dilated. The respirophasic diameter changes were blunted (< 50%), consistent with elevated central venous pressure. - Pericardium, extracardiac: A trivial pericardial effusion was identified posterior to the heart.  Impressions:  - No cardiac source of embolism was identified, but cannot be ruled out on the basis of this examination. ASSESSMENT AND PLAN   1. PAF - No definitive afib on tele. Irregular rhythm on tele at rate of 90-100s. Occasional rate of 130s. Ectopy vs MAT? MD to review.  - CHADS2VASC score is at least 7 (chronic systolic CHF 1, HTN 1 , age 81 , CVA 2 , extensive aortic atherosclerosis 1) - Needs systemic anticoagulation, will differ time to neurology - Continue ASA 81mg  - Per Dr. Caryl Comes - discharge with 30 day monitor if no afib dectected in house, with high pretest prob making ILR perhaps not the right first choice of monitoring. If 30 day recorder were negative, would implant LINQ - on Toprol XL 25mg  BID, rate still high with BP of 160s/80s. Consider increasing dose.    2. Stroke - Acute nonhemorrhagic right frontal lobe - per neurology  3. Left ICA stenosis - neurology will f/u as outpatient.  4.  Acute on chronic combined systolic/diastolic heart failure: EF 40-45% - diuresed 776ml in last 24 hours. No daily weight. Repeat CXR 7/4 Stable bilateral airspace disease, again favored to reflect Pneumonia. - On IV lasix 40mg  BID, might able to switch to po tomorrow. - Continue lisinopril, Toprol xl 25mg  BID  5. Hypokalemia - K of 3.2 today, will give supplement.     Signed, Bhagat,Bhavinkumar PA-C Pager [phone] As above, patient seen and examined; no dyspnea or CP; telemetry reviewed; sinus tach with PACS; plan to continue present meds but increase toprol to 50 BID; continue ACEI; change lasix to 40 mg daily; follow renal function; will need 30 day event monitor and if  neg would need ILR; if afib demonstrated would need anticoagulation; outpt nuclear study following DC and improvement in strength. Kirk Ruths

## 2014-12-03 NOTE — Progress Notes (Signed)
Rehab admissions - I have clearance from attending MD for acute inpatient rehab admission for today.  Bed available and will admit later today.  Call me for questions.  #[phone]

## 2014-12-03 NOTE — Discharge Summary (Signed)
Physician Discharge Summary  Jeffrey [name]:[id number] DOB: 04/13/1933 DOA: 11/27/2014  PCP: Vikki Ports, MD  Admit date: 11/27/2014 Discharge date: 12/03/2014  Time spent: >30 minutes  Recommendations for Outpatient Follow-up:  CBC in 5 days to follow blood count trend (platelets, Hgb and WBC's trend) BMET in 5 days to follow electrolytes and renal function CXR in 3-4 weeks to follow airspace disease Reassess BP and adjust meds as needed Patient to follow with cardiology service for event monitoring assessment and decisions on ILR  Discharge Diagnoses:  Principal Problem:   Fever Active Problems:   Essential hypertension, benign   Pure hypercholesterolemia   Prostate cancer   Type 2 diabetes mellitus without complication   Murmur   TIA (transient ischemic attack)   Aortic atherosclerosis   Sepsis   Acute encephalopathy   Thrombocytopenia   AKI (acute kidney injury)   Severe sepsis   Respiratory distress   Pyrexia   Lacunar infarct, acute   RMSF [facility] spotted fever)   Acute CVA (cerebrovascular accident)   Acute on chronic combined systolic and diastolic CHF (congestive heart failure)   Discharge Condition: stable and improved. Discharge to inpatient rehab unit for rehabilitation.  Diet recommendation: heart healthy and low carbohydrates  Filed Weights   11/27/14 1816 11/28/14 1853  Weight: 78.019 kg (172 lb) 78.9 kg (173 lb 15.1 oz)    History of present illness:  81 y.o. male with PMH of hypertension, hyperlipidemia, diet-controlled diabetes mellitus, prostate cancer (s/p of radiation therapy and seed implant), TIA, who presents with fever and altered mental status. Found to have maculopapular rash and high concerns for RMSF. In ED, patient was found to have temperature 102.1, WBC 7.8, tachycardia, negative urinalysis (urinalysis positive for hgb) and thrombocytopenia, electrolytes okay. Chest x-ray negative. CT head is negative for acute abnormalities.  Patient is admitted to inpatient for further evaluation and treatment.  Hospital Course:  1-septic shock/sepsis due to RMSF -sepsis and shock features essentially resolved -will continue doxycycline to complete a total of 2 weeks total of therapy (6/14); will treat with 8 more days at discharge -continue supportive care  2-encephalopathy: slowly resolving and improving  -multifactorial: RMSF, stroke, sundowning and insomnia -will continue tx of infection with antibiotics -continue PT/OT/SPT -continue constant reorientation  -oriented X3 at discharge -will benefit of PRN benadryl to help with insomnia (use it PRN at home)  3-VDRF and presumed PNA -denies SOB and with improvement on O2 sat -no fever and with CXR demonstrating mild pulmonary edema and vascular congestion -after discussing with ID, will hold on adding any further abx's, will continue tx with doxycycline, lasix and lungs hygiene  -received 3 days of vanc/zosyn around time of intubation -most likely aspiration pneumonitis as part of airspace disease appreciated on CXR -will continue and encourage flutter valve and Incentive spirometry  -repeat CXR in 3-4 weeks to follow resolution on airspace disease  4-Acute Rt frontal CVA with high grade Lt ICA stenosis. -continue ASA, statin and risk factor modifications -appreciate neurology assistance and rec's -patient to follow as an outpatient with vascular surgery for further decision treatment on left ICA stenosis  5-positive blood cx: 1/2 coagulase neg staph -appears to be a contaminant -will follow rec's from ID -no need of IV abx's for that at this moment  6-PAF: transient and resolved now -given positive stroke (CHADs VASC score is at least 5-7) -will need anticoagulation eventually,  if A. Fib/Flutter/PAF proven -per cardiology, will use event monitoring; if no abnormalities on event  monitoring, will need loop recorder -continue ASA for now -concerns for falls at  this moment (given severe deconditioning)  7-acute on chronic combined systolic/diastolic heart failure: EF 40-45% -will monitor daily weights -follow strict intake and output -patient started on metoprolol and lisinopril -advise to follow low sodium diet -will discharge on lasix 40mg  daily -cardiology consulted given mild troponin elevation and hypokinesis on Echo; plan is for outpatient stress test -will follow rec's -continue ASA, b-blocker and statins  8-HLD: continue statins  9-severe deconditioning and falls risk: continue PT/OT/SPT -patient will be discharge to CIR for rehabilitation   10-pre-diabetes -A1C 6.2 -continue diet control  Procedures:  See below for x-ray reports  2D Echo EF 40-45%. No cardiac source of embolism identified.  Carotid Doppler findings: Right = 1-39% ICA stenosis. Left = 80-99% ICA stenosis.  EEG 11/29/2014 - EEG is abnormal due to moderate diffuse slowing of the waking background.  Consultations:  ID  Neurology  Cardiology  PCCM  CIR  Discharge Exam: Filed Vitals:   12/03/14 1357  BP: 142/80  Pulse: 65  Temp: 97.8 F (36.6 C)  Resp: 18    General: More oriented and feeling better overall. Still severely deconditioned and today with increased SOB and tachypnea. No CP, no fever.   Cardiovascular: positive murmur, no rubs or gallops, multiple PVC's, no frank JVD  Respiratory: mild end exp wheezing, decrease BS at bases, but no frank crackles. Positive rhonchi crackles  Abdomen: soft, NT, ND, positive BS  Musculoskeletal: trace edema LE bilaterally, no cyanosis  Neuro: no nystagmus, mild dysarthria, oriented X 3. MS 4/5 bilaterally   Discharge Instructions   Discharge Instructions    Diet - low sodium heart healthy    Complete by:  As directed      Discharge instructions    Complete by:  As directed   Maintain adequate hydration Follow low sodium diet (less than 2--2.5 gram daily) Check weight on daily  basis Medications as prescribed Rehabilitation as per inpatient rehab unit Will need event monitoring for 30 days and if not abnormalities plan is for ILR          Current Discharge Medication List    START taking these medications   Details  budesonide (PULMICORT) 0.25 MG/2ML nebulizer solution Take 2 mLs (0.25 mg total) by nebulization 2 (two) times daily.    doxycycline (VIBRA-TABS) 100 MG tablet Take 1 tablet (100 mg total) by mouth every 12 (twelve) hours.    furosemide (LASIX) 40 MG tablet Take 1 tablet (40 mg total) by mouth daily.    ipratropium-albuterol (DUONEB) 0.5-2.5 (3) MG/3ML SOLN Take 3 mLs by nebulization every 6 (six) hours as needed.    lisinopril (PRINIVIL,ZESTRIL) 20 MG tablet Take 1 tablet (20 mg total) by mouth daily.    white petrolatum (VASELINE) GEL Topical use as needed for lips care Refills: 0      CONTINUE these medications which have CHANGED   Details  metoprolol succinate (TOPROL-XL) 50 MG 24 hr tablet Take 1 tablet (50 mg total) by mouth 2 (two) times daily with a meal. Take with or immediately following a meal.      CONTINUE these medications which have NOT CHANGED   Details  Acetaminophen (TYLENOL ARTHRITIS PAIN PO) Take 2 tablets by mouth 2 (two) times daily.      aspirin 81 MG tablet Take 81 mg by mouth 2 (two) times daily.     doxylamine, Sleep, (EQ SLEEP AID) 25 MG tablet Take 25 mg by  mouth at bedtime as needed for sleep.    hydrocortisone cream 1 % Apply 1 application topically as needed (for ankles).    lovastatin (MEVACOR) 20 MG tablet Take 2 tablets (40 mg total) by mouth at bedtime. Qty: 180 tablet, Refills: 1   Associated Diagnoses: Pure hypercholesterolemia    Multiple Vitamins-Minerals (PRESERVISION AREDS) CAPS Take 1 capsule by mouth every morning.      STOP taking these medications     amLODipine (NORVASC) 2.5 MG tablet      naproxen sodium (ANAPROX) 220 MG tablet        No Known Allergies   The results of  significant diagnostics from this hospitalization (including imaging, microbiology, ancillary and laboratory) are listed below for reference.    Significant Diagnostic Studies: Dg Chest 2 View  11/25/2014   CLINICAL DATA:  Fever for 3 days.  Ex-smoker.  EXAM: CHEST  2 VIEW  COMPARISON:  10/01/2013  FINDINGS: Midline trachea. Mild cardiomegaly. Extensive aortic atherosclerosis. No pleural effusion or pneumothorax. Mild biapical pleural thickening. Mild scarring at the left lung base.  IMPRESSION: No acute cardiopulmonary disease.  Atherosclerosis.   Electronically Signed   By: Abigail Miyamoto M.D.   On: 11/25/2014 17:18   Ct Head Wo Contrast  11/27/2014   CLINICAL DATA:  81 year old man with confusion, weakness and fever  EXAM: CT HEAD WITHOUT CONTRAST  TECHNIQUE: Contiguous axial images were obtained from the base of the skull through the vertex without intravenous contrast.  COMPARISON:  Prior head CT 10/01/2013; prior brain MRI 10/02/2013  FINDINGS: Mild image degradation secondary to motion related artifact. Negative for acute intracranial hemorrhage, acute infarction, mass, mass effect, hydrocephalus or midline shift. Gray-white differentiation is preserved throughout. Stable appearance of a moderately severe periventricular, subcortical and deep white matter hypoattenuation most consistent with chronic microvascular ischemic white matter disease. Cortical cerebral atrophy is also unchanged. Mild ex vacuo ventriculomegaly. No focal soft tissue or calvarial abnormality. Unremarkable globes and orbits. Normal aeration mastoid air cells and paranasal sinuses.  IMPRESSION: No acute intracranial abnormality.  Stable pattern of cerebral cortical atrophy and moderately advanced chronic microvascular ischemic white matter disease.   Electronically Signed   By: Jacqulynn Cadet M.D.   On: 11/27/2014 19:37   Mr Brain Wo Contrast  11/28/2014   CLINICAL DATA:  Fever of unknown origin. Sepsis. Right-sided weakness.  Difficulty speaking. Lumbar puncture immediately preceded the MRI scan.  EXAM: MRI HEAD WITHOUT CONTRAST  TECHNIQUE: Multiplanar, multiecho pulse sequences of the brain and surrounding structures were obtained without intravenous contrast.  COMPARISON:  CT head without contrast 11/27/2014  FINDINGS: An acute 1.5 cm linear white matter infarct is present in the anterior right frontal lobe.  No other acute infarcts are present. Remote lacunar infarcts are present in the anterior coronal radiata, left greater than right. Moderate periventricular T2 changes are present bilaterally.  Flow is present in the major intracranial arteries. The globes and orbits are intact. The paranasal sinuses and mastoid air cells are clear.  There is thinning of the corpus callosum. Midline structures are otherwise within normal limits.  IMPRESSION: 1. Acute nonhemorrhagic white matter infarct in the anterior right frontal lobe. 2. Moderate atrophy and diffuse white matter changes are again seen bilaterally, compatible with chronic microvascular ischemia. 3. Remote lacunar infarcts of the anterior left coronal radiata. 4. Thinning of the corpus callosum compatible with the degree of generalized atrophy.   Electronically Signed   By: San Morelle M.D.   On: 11/28/2014 15:50  Dg Chest Port 1 View  12/03/2014   CLINICAL DATA:  Tachypnea.  History of pneumonia  EXAM: PORTABLE CHEST - 1 VIEW  COMPARISON:  Chest x-ray from yesterday  FINDINGS: Stable pattern of asymmetric bilateral airspace disease. No effusion or diffuse septal thickening.  Stable cardiomegaly and aortic contours.  IMPRESSION: Stable bilateral airspace disease, again favored to reflect pneumonia.   Electronically Signed   By: Monte Fantasia M.D.   On: 12/03/2014 00:42   Dg Chest Port 1 View  12/02/2014   CLINICAL DATA:  Increasing shortness of breath. Hospital admission for fever.  EXAM: PORTABLE CHEST - 1 VIEW  COMPARISON:  11/29/2014.  11/25/2014.  FINDINGS: Low  volume chest. This accentuates the size of the cardiopericardial silhouette which is upper limits of normal for projection.  Monitoring leads project over the chest.  LEFT perihilar airspace disease is present. This may represent asymmetric pulmonary edema or pneumonia.  Increased density is present over the RIGHT scapula, favored to be due to bony overlap rather than airspace disease.  IMPRESSION: New LEFT mid lung airspace disease around the LEFT hilum suspicious for pneumonia. Asymmetric/ atypical pulmonary edema less likely. Followup PA and lateral chest X-ray is recommended in 3-4 weeks following trial of antibiotic therapy to ensure resolution and exclude underlying malignancy.   Electronically Signed   By: Dereck Ligas M.D.   On: 12/02/2014 11:28   Portable Chest Xray  11/29/2014   CLINICAL DATA:  Respiratory DIS breast, sepsis, acute renal injury, acute encephalopathy.  EXAM: PORTABLE CHEST - 1 VIEW  COMPARISON:  Portable chest x-ray of November 28, 2014  FINDINGS: The lungs are adequately inflated. There is medial left lower lobe density. The cardiac silhouette is normal in size. The pulmonary vascularity is not engorged. The endotracheal tube tip lies approximately 4.3 cm above the carina. The esophagogastric tube tip projects below the inferior margin of the image. The left internal jugular venous catheter tip projects over the distal third of the SVC.  IMPRESSION: Medial left lower lobe subsegmental atelectasis. There is no CHF. The support tubes are in reasonable position.   Electronically Signed   By: David  Martinique M.D.   On: 11/29/2014 08:04   Portable Chest Xray  11/28/2014   CLINICAL DATA:  Check endotracheal tube placement  EXAM: PORTABLE CHEST - 1 VIEW  COMPARISON:  11/27/2014  FINDINGS: Endotracheal tube is now seen 4.6 cm above the carina. A nasogastric catheter is noted extending into the stomach. Left central venous line is noted with the tip at the cavoatrial junction. The lungs are well  aerated. Some patchy changes are noted in the left perihilar region which may represent some early atelectasis. The cardiac shadow is within normal limits. Mild aortic calcifications are seen. No bony abnormality is noted.  IMPRESSION: Tubes and lines as described above in satisfactory position.  Left perihilar atelectatic change.  Aortic atherosclerotic change.   Electronically Signed   By: Inez Catalina M.D.   On: 11/28/2014 20:37   Dg Chest Port 1 View  11/27/2014   CLINICAL DATA:  Fever for 4 days  EXAM: PORTABLE CHEST - 1 VIEW  COMPARISON:  11/25/2014  FINDINGS: Cardiac shadow is stable but accentuated by the portable technique. The lungs are well aerated without focal infiltrate or sizable effusion. Aortic atherosclerotic changes again noted without aneurysmal dilatation. No bony abnormality is seen.  IMPRESSION: No acute abnormality noted.  Aortic atherosclerosis.   Electronically Signed   By: Inez Catalina M.D.   On:  11/27/2014 19:02   Dg Abd Portable 1v  11/28/2014   CLINICAL DATA:  Evaluate OG tube placement  EXAM: PORTABLE ABDOMEN - 1 VIEW  COMPARISON:  None.  FINDINGS: The OG tube tip is in the body of the stomach. Side port is well positioned below the GE junction. Bowel gas pattern is unremarkable  IMPRESSION: 1. OG tube tip in body of stomach.   Electronically Signed   By: Kerby Moors M.D.   On: 11/28/2014 20:53    Microbiology: Recent Results (from the past 240 hour(s))  Urine culture     Status: None   Collection Time: 11/25/14  3:33 PM  Result Value Ref Range Status   Colony Count >=100,000 COLONIES/ML  Final   Organism ID, Bacteria Multiple bacterial morphotypes present, none  Final   Organism ID, Bacteria predominant. Suggest appropriate recollection if   Final   Organism ID, Bacteria clinically indicated.  Final  Culture, blood (single)     Status: None   Collection Time: 11/25/14  4:00 PM  Result Value Ref Range Status   Culture STAPHYLOCOCCUS SPECIES (COAGULASE NEGATIVE)   Final   Organism ID, Bacteria STAPHYLOCOCCUS SPECIES (COAGULASE NEGATIVE)  Final    Comment: The significance of isolating this organism from a single venipuncture cannot be predicted without further clinical and culture correlation Susceptibilities available only on request. Report Faxed by Request Gram Stain Report Called to,Read Back By and Verified With: [place] SCALES 11/26/14 1520 BY SMITHERSJ       Susceptibility   Staphylococcus species (coagulase negative) -  (no method available)    PENICILLIN >=0.5 Resistant     OXACILLIN <=0.25 Sensitive     CEFAZOLIN  Sensitive     GENTAMICIN <=0.5 Sensitive     CIPROFLOXACIN <=0.5 Sensitive     LEVOFLOXACIN 0.25 Sensitive     TRIMETH/SULFA <=10 Sensitive     VANCOMYCIN <=0.5 Sensitive     CLINDAMYCIN <=0.25 Sensitive     ERYTHROMYCIN <=0.25 Sensitive     RIFAMPIN <=0.5 Sensitive     TETRACYCLINE <=1 Sensitive   Culture, blood (routine x 2)     Status: None   Collection Time: 11/27/14  6:31 PM  Result Value Ref Range Status   Specimen Description BLOOD LEFT FOREARM  Final   Special Requests BOTTLES DRAWN AEROBIC AND ANAEROBIC 5CC  Final   Culture NO GROWTH 5 DAYS  Final   Report Status 12/02/2014 FINAL  Final  Urine culture     Status: None   Collection Time: 11/27/14  7:44 PM  Result Value Ref Range Status   Specimen Description URINE, RANDOM  Final   Special Requests NONE  Final   Culture NO GROWTH 2 DAYS  Final   Report Status 11/29/2014 FINAL  Final  Culture, blood (routine x 2)     Status: None   Collection Time: 11/27/14  8:00 PM  Result Value Ref Range Status   Specimen Description BLOOD RIGHT FOREARM  Final   Special Requests BOTTLES DRAWN AEROBIC AND ANAEROBIC 5ML  Final   Culture NO GROWTH 5 DAYS  Final   Report Status 12/02/2014 FINAL  Final  CSF culture with Stat gram stain     Status: None   Collection Time: 11/28/14  1:10 PM  Result Value Ref Range Status   Specimen Description CSF  Final   Special Requests  TUBE 2  Final   Gram Stain   Final    CYTOSPIN SMEAR WBC PRESENT,BOTH PMN AND MONONUCLEAR NO ORGANISMS SEEN  Culture NO GROWTH 3 DAYS  Final   Report Status 12/01/2014 FINAL  Final  MRSA PCR Screening     Status: None   Collection Time: 11/28/14  6:56 PM  Result Value Ref Range Status   MRSA by PCR NEGATIVE NEGATIVE Final    Comment:        The GeneXpert MRSA Assay (FDA approved for NASAL specimens only), is one component of a comprehensive MRSA colonization surveillance program. It is not intended to diagnose MRSA infection nor to guide or monitor treatment for MRSA infections.   Culture, respiratory (NON-Expectorated)     Status: None   Collection Time: 11/28/14  8:16 PM  Result Value Ref Range Status   Specimen Description TRACHEAL ASPIRATE  Final   Special Requests NONE  Final   Gram Stain   Final    RARE WBC PRESENT, PREDOMINANTLY MONONUCLEAR RARE SQUAMOUS EPITHELIAL CELLS PRESENT NO ORGANISMS SEEN Performed at [facility]    Culture   Final    Non-Pathogenic Oropharyngeal-type Flora Isolated. Performed at [facility]    Report Status 12/01/2014 FINAL  Final     Labs: Basic Metabolic Panel:  Recent Labs Lab 11/28/14 0020 11/28/14 2118 11/29/14 0433 11/30/14 0345 12/03/14 0527  NA 132* 135 137 136 142  K 3.6 4.1 3.9 3.6 3.2*  CL 97* 104 107 105 103  CO2 24 22 22 22 27   GLUCOSE 180* 164* 128* 126* 147*  BUN 26* 24* 28* 23* 23*  CREATININE 1.05 1.06 1.01 0.89 0.91  CALCIUM 7.6* 7.7* 7.6* 7.9* 8.5*  MG  --   --   --  2.0  --   PHOS  --   --   --  3.3  --    Liver Function Tests:  Recent Labs Lab 11/27/14 1829 11/28/14 0020 11/28/14 2118  AST 84* 64* 56*  ALT 48 40 31  ALKPHOS 53 49 44  BILITOT 1.0 0.7 1.1  PROT 6.7 5.6* 5.5*  ALBUMIN 3.2* 2.6* 2.3*    Recent Labs Lab 11/28/14 1708  AMMONIA 13   CBC:  Recent Labs Lab 11/27/14 1829 11/28/14 0020 11/28/14 2118 11/29/14 0433 11/30/14 0345 12/03/14 0527  WBC  7.8 7.5 8.9 12.6* 14.9* 14.9*  NEUTROABS 6.7  --   --   --   --   --   HGB 13.0 11.1* 10.7* 9.9* 10.0* 10.7*  HCT 37.8* 33.1* 31.5* 30.1* 30.1* 32.4*  MCV 96.2 95.9 95.2 96.5 96.2 97.0  PLT 94* 83* 82* 112* 146* 390   Cardiac Enzymes:  Recent Labs Lab 11/28/14 1708 11/28/14 2118 11/29/14 0433  TROPONINI 0.05* 0.10* 0.07*   CBG:  Recent Labs Lab 11/29/14 2358 11/30/14 0342 11/30/14 0758 11/30/14 1144 11/30/14 1541  GLUCAP 152* 126* 107* 169* 154*    Signed:  Barton Dubois  [facility] 12/03/2014, 2:09 PM

## 2014-12-03 NOTE — Progress Notes (Signed)
Rehab admissions - I met with patient, wife and daughter today.  All are in agreement to inpatient rehab admission.  Awaiting MD to determine if patient is medically ready for inpatient rehab.  Call me for questions.  #[phone]

## 2014-12-03 NOTE — Progress Notes (Signed)
Physical Therapy Treatment Patient Details Name: Jeffrey [name] MRN: [id number] DOB: [birth year] Today's Date: 12/03/2014    History of Present Illness 6/29 Admitted to [facility] with 4 days of fever, malaise and 1 day AMS. Found to be thrombocytopenic. Diffuse maculopapular rash. Tick bite. Transferred to ICU evening of 6/30 with markedly worsened cognition and labored breathing. Intubated.  extubated 11/29/14. Pos for RMSF as well as CVA ant right frontal found on MRI 6/30    PT Comments    Pt making steady progress. Agree with plan for CIR.  Follow Up Recommendations  CIR     Equipment Recommendations  None recommended by PT    Recommendations for Other Services Rehab consult     Precautions / Restrictions Precautions Precautions: Fall Restrictions Weight Bearing Restrictions: No    Mobility  Bed Mobility               General bed mobility comments: Pt in chair  Transfers Overall transfer level: Needs assistance Equipment used: Rolling walker (2 wheeled) Transfers: Sit to/from Stand Sit to Stand: Mod assist Stand pivot transfers: Mod assist       General transfer comment: Assist to bring hips up and for balance. Pt with posterior lean.  Ambulation/Gait Ambulation/Gait assistance: +2 physical assistance;Min assist Ambulation Distance (Feet): 100 Feet Assistive device: Rolling walker (2 wheeled) Gait Pattern/deviations: Step-through pattern;Decreased stride length;Drifts right/left Gait velocity: decr Gait velocity interpretation: Below normal speed for age/gender General Gait Details: Assist for balance. Verbal cues to stay in walker and to assist turning walker.   Stairs            Wheelchair Mobility    Modified Rankin (Stroke Patients Only) Modified Rankin (Stroke Patients Only) Pre-Morbid Rankin Score: No symptoms Modified Rankin: Moderately severe disability     Balance Overall balance assessment: Needs assistance Sitting-balance support: No  upper extremity supported;Feet supported Sitting balance-Leahy Scale: Fair     Standing balance support: Bilateral upper extremity supported Standing balance-Leahy Scale: Poor Standing balance comment: Walker and min A for static standing once posterior lean corrected.                    Cognition Arousal/Alertness: Awake/alert Behavior During Therapy: Flat affect Overall Cognitive Status: Impaired/Different from baseline Area of Impairment: Memory;Safety/judgement;Awareness Orientation Level: Place;Time;Situation Current Attention Level: Selective Memory: Decreased short-term memory Following Commands: Follows one step commands inconsistently;Follows multi-step commands inconsistently Safety/Judgement: Decreased awareness of safety Awareness: Intellectual Problem Solving: Slow processing;Difficulty sequencing;Requires verbal cues      Exercises      General Comments General comments (skin integrity, edema, etc.): SaO2 98% on RA with amb      Pertinent Vitals/Pain Pain Assessment: No/denies pain    Home Living                      Prior Function            PT Goals (current goals can now be found in the care plan section) Acute Rehab PT Goals Patient Stated Goal: return home PT Goal Formulation: With patient/family Time For Goal Achievement: 12/14/14 Potential to Achieve Goals: Good Progress towards PT goals: Progressing toward goals    Frequency  Min 4X/week    PT Plan Current plan remains appropriate    Co-evaluation             End of Session Equipment Utilized During Treatment: Gait belt Activity Tolerance: Patient tolerated treatment well Patient left: in chair;with call bell/phone within  reach;with family/visitor present;with chair alarm set     Time: [phone] PT Time Calculation (min) (ACUTE ONLY): 16 min  Charges:  $Gait Training: 8-22 mins                    G Codes:      Jiovanna Frei Dec 05, 2014, 12:36 PM  [facility] PT [phone]

## 2014-12-04 ENCOUNTER — Inpatient Hospital Stay (HOSPITAL_COMMUNITY): Payer: Medicare Other

## 2014-12-04 ENCOUNTER — Inpatient Hospital Stay (HOSPITAL_COMMUNITY): Payer: Medicare Other | Admitting: Physical Therapy

## 2014-12-04 ENCOUNTER — Inpatient Hospital Stay (HOSPITAL_COMMUNITY): Payer: Medicare Other | Admitting: Speech Pathology

## 2014-12-04 DIAGNOSIS — E876 Hypokalemia: Secondary | ICD-10-CM

## 2014-12-04 LAB — COMPREHENSIVE METABOLIC PANEL
ALT: 343 U/L — ABNORMAL HIGH (ref 17–63)
AST: 310 U/L — AB (ref 15–41)
Albumin: 2.6 g/dL — ABNORMAL LOW (ref 3.5–5.0)
Alkaline Phosphatase: 58 U/L (ref 38–126)
Anion gap: 10 (ref 5–15)
BILIRUBIN TOTAL: 1.2 mg/dL (ref 0.3–1.2)
BUN: 28 mg/dL — AB (ref 6–20)
CHLORIDE: 103 mmol/L (ref 101–111)
CO2: 31 mmol/L (ref 22–32)
Calcium: 8.9 mg/dL (ref 8.9–10.3)
Creatinine, Ser: 0.89 mg/dL (ref 0.61–1.24)
GFR calc Af Amer: >60 mL/min (ref >60)
Glucose, Bld: 137 mg/dL — ABNORMAL HIGH (ref 65–99)
Potassium: 2.8 mmol/L — ABNORMAL LOW (ref 3.5–5.1)
Sodium: 144 mmol/L (ref 135–145)
Total Protein: 6.5 g/dL (ref 6.5–8.1)

## 2014-12-04 LAB — CBC WITH DIFFERENTIAL/PLATELET
BASOS PCT: 0 % (ref 0–1)
Basophils Absolute: 0 10*3/uL (ref 0.0–0.1)
EOS ABS: 0.1 10*3/uL (ref 0.0–0.7)
EOS PCT: 1 % (ref 0–5)
HCT: 33.9 % — ABNORMAL LOW (ref 39.0–52.0)
HEMOGLOBIN: 11.1 g/dL — AB (ref 13.0–17.0)
LYMPHS ABS: 2.4 10*3/uL (ref 0.7–4.0)
Lymphocytes Relative: 24 % (ref 12–46)
MCH: 31.7 pg (ref 26.0–34.0)
MCHC: 32.7 g/dL (ref 30.0–36.0)
MCV: 96.9 fL (ref 78.0–100.0)
MONO ABS: 1.1 10*3/uL — AB (ref 0.1–1.0)
MONOS PCT: 11 % (ref 3–12)
NEUTROS ABS: 6.3 10*3/uL (ref 1.7–7.7)
NEUTROS PCT: 64 % (ref 43–77)
PLATELETS: 466 10*3/uL — AB (ref 150–400)
RBC: 3.5 MIL/uL — AB (ref 4.22–5.81)
RDW: 13.4 % (ref 11.5–15.5)
WBC: 9.9 10*3/uL (ref 4.0–10.5)

## 2014-12-04 LAB — GLUCOSE, CAPILLARY: Glucose-Capillary: 143 mg/dL — ABNORMAL HIGH (ref 65–99)

## 2014-12-04 MED ORDER — GLUCERNA SHAKE PO LIQD
237.0000 mL | Freq: Two times a day (BID) | ORAL | Status: DC
  Administered 2014-12-05 – 2014-12-09 (×4): 237 mL via ORAL

## 2014-12-04 MED ORDER — GLUCERNA SHAKE PO LIQD
237.0000 mL | Freq: Three times a day (TID) | ORAL | Status: DC
  Administered 2014-12-04: 237 mL via ORAL

## 2014-12-04 MED ORDER — GLUCERNA PO LIQD: 237.0000 mL | Freq: Two times a day (BID) | ORAL | Status: DC

## 2014-12-04 MED ORDER — POTASSIUM CHLORIDE CRYS ER 20 MEQ PO TBCR
40.0000 meq | EXTENDED_RELEASE_TABLET | Freq: Three times a day (TID) | ORAL | Status: DC
  Administered 2014-12-04 (×3): 40 meq via ORAL
  Filled 2014-12-04 (×7): qty 2

## 2014-12-04 NOTE — Progress Notes (Signed)
Occupational Therapy Assessment and Plan  Patient Details  Name: Jeffrey [name] MRN: [id number] Date of Birth: 1933/02/03  OT Diagnosis: abnormal posture, cognitive deficits and muscle weakness (generalized) Rehab Potential: Rehab Potential (ACUTE ONLY): Good ELOS: 7-10 days   Today's Date: 12/04/2014 OT Individual Time: 0900-1000 and 1400-1430 OT Individual Time Calculation (min): 60 min and 30 min      Problem List:  Patient Active Problem List   Diagnosis Date Noted  . Stroke with cerebral ischemia 12/03/2014  . Pre-diabetes   . Acute on chronic combined systolic and diastolic CHF (congestive heart failure)   . Acute CVA (cerebrovascular accident)   . Left carotid artery stenosis 11/30/2014  . 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 Pyrexia   . Lacunar infarct, acute   . AKI (acute kidney injury)   . Severe sepsis   . Respiratory distress   . Fever 11/27/2014  . Sepsis 11/27/2014  . Acute encephalopathy 11/27/2014  . Thrombocytopenia 11/27/2014  . Blood poisoning   . Aortic atherosclerosis 11/25/2014  . 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 Awareness alteration, transient 12/26/2013  . Low serum testosterone level 12/25/2013  . TIA (transient ischemic attack) 10/10/2013  . Radiation proctitis 11/28/2012  . Colon cancer screening 11/28/2012  . Murmur 02/23/2011  . Type 2 diabetes mellitus without complication 70/26/3785  . Essential hypertension, benign 11/26/2010  . Impaired fasting glucose 11/26/2010  . Pure hypercholesterolemia 11/26/2010  . Prostate cancer 11/26/2010    Past Medical History:  Past Medical History  Diagnosis Date  . Hypertension   . Hypercholesteremia   . Impaired fasting glucose   . Prostate cancer radiation + seeding implant (Dr.[name])  . Rotator cuff tear, right 10/2010    supraspinatous and infraspinatous  . Diabetes mellitus   . Macular degeneration   . Heart murmur     mild aortic stenosis   . Radiation proctitis 11/2012    treated with APC ablation and Canasa suppositories (Dr. Hilarie Fredrickson)  . TIA (transient ischemic attack) 10/02/13    hosp at [facility] x 1 night  . 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 Aortic atherosclerosis 11/25/2014  . Acute encephalopathy 11/27/2014  . RMSF [facility] spotted fever) 10/2014    (hosp with FUO, encephalopathy)  . Multiple lacunar infarcts   . Acute CVA (cerebrovascular accident)   . Acute on chronic combined systolic and diastolic CHF (congestive heart failure)    Past Surgical History:  Past Surgical History  Procedure Laterality Date  . Tonsillectomy and adenoidectomy  age 50  . Prostate seed implant    . Rotator cuff repair  06/16/2011    right (Dr. Berenice Primas)  . Colonoscopy N/A 11/28/2012    Procedure: COLONOSCOPY;  Surgeon: Jerene Bears, MD;  Location: [facility] ENDOSCOPY;  Service: Gastroenterology;  Laterality: N/A;    Assessment & Plan Clinical Impression: Jeffrey [name] is a 81 y.o. RH-male with history of HTN, DM type 2, macular degeneration who was admitted on 11/27/14 with confusion,difficulty walking, fever and mild right sided weakness with concerns of encephalitis. MRI brain done revealing He was found to be anemic with thrombocytopenia, leukocytosis with work up indicating [facility] Fever. MRI brain done revealing acute nonhemorrhagic white matter infarct in the anterior right frontal lobe and LP with glucose 70 and protein111. He developed septic shock with respiratory failure requiring intubation and was treated with broad spectrum antibiotics due to presumed PNA. He was extubated to  [place] on 07/01 and swallow evaluation done with initiation of regular diet. EEG revealed moderate diffuse slowing without epileptiform discharges. Carotid dopplers with L- 80-89% ICA stenosis. 2D echo with EF 40-45% with diffuse hypokinesis and moderate calcification AV. Neurology recommends low dose ASA for  secondary stroke prevention and to follow up with [facility] past discharge for asymptomatic L-ICA stenosis. Cardiology consulted 07/03 due to elevated cardiac enzymes and bigeminy with question of PAF. Dr. Caryl Comes evaluated patient and felt that patient with MAT and no definite A fib. He recommended ASA and 30 day monitor if no A fib detected in house. Patient developed SOB with tachypnea on 07/03 pm and CXR done revealing LML airspace disease concerning for PNA. No additional antibiotics needed per ID and patient treated with IV diuresis for acute on chronic combined systolic/diastolic CHF. Therapy ongoing and CIR recommended by MD and rehab team  Patient currently requires min with basic self-care skills secondary to muscle weakness, decreased cardiorespiratoy endurance, unbalanced muscle activation, decreased problem solving and delayed processing and decreased standing balance, decreased postural control and decreased balance strategies.  Prior to hospitalization, patient could complete BADLs with independent .  Patient will benefit from skilled intervention to increase independence with basic self-care skills and increase level of independence with iADL prior to discharge home with care partner.  Anticipate patient will require 24 hour supervision and follow up outpatient.  OT - End of Session Activity Tolerance: Decreased this session Endurance Deficit: Yes Endurance Deficit Description: SOB with transitional movements; required frequent rest breaks; O2 stats checked reguarly during session  OT Assessment Rehab Potential (ACUTE ONLY): Good OT Patient demonstrates impairments in the following area(s): Balance;Safety;Endurance;Motor;Vision OT Basic ADL's Functional Problem(s): Grooming;Bathing;Dressing;Toileting OT Transfers Functional Problem(s): Toilet;Tub/Shower OT Additional Impairment(s): None OT Plan OT Intensity: Minimum of 1-2 x/day, 45 to 90 minutes OT Frequency: 5 out of 7 days OT  Duration/Estimated Length of Stay: 7-10 days OT Treatment/Interventions: Balance/vestibular training;Discharge planning;Self Care/advanced ADL retraining;Therapeutic Activities;UE/LE Coordination activities;Cognitive remediation/compensation;Functional mobility training;Patient/family education;Therapeutic Exercise;Visual/perceptual remediation/compensation;DME/adaptive equipment instruction;Neuromuscular re-education;UE/LE Strength taining/ROM;Psychosocial support OT Basic Self-Care Anticipated Outcome(s): mod I OT Toileting Anticipated Outcome(s): mod I  OT Bathroom Transfers Anticipated Outcome(s): mod I  OT Recommendation Patient destination: Home Follow Up Recommendations: Outpatient OT;24 hour supervision/assistance Equipment Recommended: To be determined   Skilled Therapeutic Intervention Session 1: OT evaluation completed. Therapist discussed role of OT, goals of therapy, possible ELOS, fall risk, and safety plan. OT session focused on functional transfers, sit<>stand, standing balance, postural control, and activity tolerance. Pt received seated in w/c with wife present and agreeable to therapy. Pt engaged in bathing/dressing sit<>stand at sink with min A 3x. Pt required min cues for sequencing transition between UB and LB bathing. Pt demonstrated good sitting balance with forward lean to don shoes/socks and completed LB with crossover method. Pt then completed stand-pivot toilet transfer with min A. Pt left seated in w/c with all needs in reach.   Session 2: Pt seen for 1:1 OT session with a focus on dynamic standing balance, functional ambulation cognitive dual task, activity tolerance, and safety awareness. Pt received seated in w/c with family present and agreeable to participate in therapy. Pt engaged in cognitive pipe tree task in standing for apprx 5 min with close supervision. Pt demonstrated good completion of difficult copy design and appropriate problem-solving and sequencing during  task. Pt then engaged in dynamic standing activity of ball kick with BLE 2x for apprx 10 min total. Pt demonstrated good balance during task requiring SBA-min A.  Pt stated he was fatigued and would like to return to room. Pt ambulated back to room at SBA-min A demonstrating fast gait speed which decreased balance. Pt stopped and assumed squat position to access water fountain and required min A to stand from squat position. Pt transferred back to bed at supervision. Pt left supine in bed with family present.    OT Evaluation Precautions/Restrictions  Precautions Precautions: Fall Restrictions Weight Bearing Restrictions: No General   Vital Signs  Pain Pain Assessment Pain Assessment: No/denies pain Home Living/Prior Functioning Home Living Type of Home: House Home Access: Stairs to enter Entrance Stairs-Number of Steps: 3 Entrance Stairs-Rails: Right Home Layout: One level  Lives With: Spouse Prior Function Level of Independence: Independent with basic ADLs, Independent with gait, Independent with transfers  Able to Take Stairs?: Yes Driving: Yes Leisure: Hobbies-yes (Comment) Comments: Fishing, woodworking, gardening ADL   Vision/Perception  Vision- History Baseline Vision/History: Wears glasses Wears Glasses: At all times Patient Visual Report: No change from baseline Vision- Assessment Vision Assessment?: Yes Eye Alignment: Within Functional Limits Ocular Range of Motion: Within Functional Limits Alignment/Gaze Preference: Within Defined Limits Tracking/Visual Pursuits: Able to track stimulus in all quads without difficulty Saccades: Within functional limits Convergence: Impaired (comment) Visual Fields: No apparent deficits  Cognition Overall Cognitive Status: Within Functional Limits for tasks assessed Arousal/Alertness: Awake/alert Orientation Level: Person;Place;Situation (pt demonstrated good orientation during eval but decreased orientation to time during  BIMS) Person: Oriented Place: Oriented Situation: Oriented Year: 2016 Month: July Day of Week: Correct Immediate Memory Recall: Sock;Blue;Bed Memory Recall: Sock;Blue;Bed Memory Recall Sock: Without Cue Memory Recall Blue: Without Cue Memory Recall Bed: With Cue Attention: Selective Selective Attention: Impaired Selective Attention Impairment: Verbal basic;Functional basic Awareness: Impaired Awareness Impairment: Emergent impairment Behaviors: Impulsive Safety/Judgment: Impaired (slightly impaired safety ) Sensation Sensation Light Touch: Appears Intact Hot/Cold: Appears Intact Proprioception: Appears Intact Coordination Gross Motor Movements are Fluid and Coordinated: Yes Fine Motor Movements are Fluid and Coordinated: Yes (tremor PTA) Finger Nose Finger Test: intention tremor with finger to nose Motor  Motor Motor: Abnormal postural alignment and control Motor - Skilled Clinical Observations: slight posterior sway in standing but able to correct with cues  Mobility  Bed Mobility Bed Mobility: Sit to Supine;Supine to Sit Supine to Sit: HOB flat Sit to Supine: HOB flat Transfers Sit to Stand: With upper extremity assist;From chair/3-in-1;From bed;4: Min guard Stand to Sit: 4: Min guard;With upper extremity assist;To bed;To chair/3-in-1  Trunk/Postural Assessment  Cervical Assessment Cervical Assessment: Exceptions to [facility] (forward head; able to correct with min cues ) Thoracic Assessment Thoracic Assessment: Within Functional Limits Lumbar Assessment Lumbar Assessment: Within Functional Limits Postural Control Postural Control: Deficits on evaluation (decreased postural control )  Balance Balance Balance Assessed: Yes Static Standing Balance Static Standing - Balance Support: No upper extremity supported;During functional activity Static Standing - Level of Assistance: 4: Min assist Dynamic Standing Balance Dynamic Standing - Balance Support: During functional  activity;No upper extremity supported Dynamic Standing - Level of Assistance: 4: Min assist Extremity/Trunk Assessment RUE Assessment RUE Assessment: Within Functional Limits (torn rotator cuff PTA; decreased shoulder flexion strength) LUE Assessment LUE Assessment: Within Functional Limits  FIM:  FIM - Grooming Grooming Steps: Wash, rinse, dry face;Wash, rinse, dry hands;Oral care, brush teeth, clean dentures;Brush, comb hair Grooming: 5: Set-up assist to obtain items FIM - Bathing Bathing Steps Patient Completed: Chest;Right Arm;Left Arm;Abdomen;Right upper leg;Left upper leg;Right lower leg (including foot);Left lower leg (including foot) Bathing: 4: Min-Patient completes 8-9 [id number] parts  or 75+ percent FIM - Upper Body Dressing/Undressing Upper body dressing/undressing steps patient completed: Thread/unthread left sleeve of pullover shirt/dress;Put head through opening of pull over shirt/dress;Pull shirt over trunk;Thread/unthread right sleeve of front closure shirt/dress Upper body dressing/undressing: 5: Set-up assist to: Obtain clothing/put away FIM - Lower Body Dressing/Undressing Lower body dressing/undressing steps patient completed: Don/Doff left sock;Don/Doff right sock;Don/Doff left shoe;Don/Doff right shoe;Thread/unthread right pants leg;Thread/unthread left pants leg;Pull pants up/down Lower body dressing/undressing: 4: Steadying Assist FIM - Bed/Chair Transfer Bed/Chair Transfer: 5: Supine > Sit: Supervision (verbal cues/safety issues);5: Sit > Supine: Supervision (verbal cues/safety issues);4: Chair or W/C > Bed: Min A (steadying Pt. > 75%);4: Bed > Chair or W/C: Min A (steadying Pt. > 75%) FIM - Radio producer Devices: Grab bars Toilet Transfers: 4-To toilet/BSC: Min A (steadying Pt. > 75%);4-From toilet/BSC: Min A (steadying Pt. > 75%)   Refer to Care Plan for Long Term Goals  Recommendations for other services: None  Discharge Criteria:  Patient will be discharged from OT if patient refuses treatment 3 consecutive times without medical reason, if treatment goals not met, if there is a change in medical status, if patient makes no progress towards goals or if patient is discharged from hospital.  The above assessment, treatment plan, treatment alternatives and goals were discussed and mutually agreed upon: by patient and by family  Dorann Ou 12/04/2014, 12:51 PM

## 2014-12-04 NOTE — Progress Notes (Addendum)
Initial Nutrition Assessment  DOCUMENTATION CODES:  Not applicable  INTERVENTION:  Glucerna shake, Snacks (Sandwich). Ordered.  Encourage adequate PO intake.  Diet education given.  NUTRITION DIAGNOSIS:  Increased nutrient needs related to  (therapy) as evidenced by estimated needs.  GOAL:  Patient will meet greater than or equal to 90% of their needs  MONITOR:  PO intake, Weight trends, Labs, I & O's  REASON FOR ASSESSMENT:  Consult Diet education  ASSESSMENT: Pt with history of HTN, DM type 2, macular degeneration who was admitted on 11/27/14 with confusion,difficulty walking, fever and mild right sided weakness with concerns of encephalitis. He was found to be anemic with thrombocytopenia, leukocytosis with work up indicating [facility] Fever.MRI brain done revealing acute nonhemorrhagic white matter infarct.  Pt reports appetite has been improving. Meal completion has been 100%. The couple of days prior to admission, pt reports having a lack of appetite, however usually has a good appetite with consumption of 3 meals a day. Usual body weight reported to be ~170 lbs. Pt with a 4.7% weight loss. Pt is agreeable to nourishment snacks (sandwich). RD to order. Glucerna shake has been ordered. Will continue with orders. RD was additionally consulted for a diet education. Education given to pt and family at bedside.   Nutrition-Focused physical exam completed. Findings are no fat depletion, mild to moderate muscle depletion, and no edema. Muscle mass depletion likely associated with the natural aging process.  Labs: Low potassium. High BUN, AST, ALT.  Height:  Ht Readings from Last 1 Encounters:  12/03/14 6' (1.829 m)    Weight:  Wt Readings from Last 1 Encounters:  12/04/14 162 lb 11.2 oz (73.8 kg)    Ideal Body Weight:  81 kg  Wt Readings from Last 10 Encounters:  12/04/14 162 lb 11.2 oz (73.8 kg)  11/28/14 173 lb 15.1 oz (78.9 kg)  11/25/14 172  lb 9.6 oz (78.291 kg)  10/17/14 175 lb (79.379 kg)  07/17/14 170 lb (77.111 kg)  04/10/14 168 lb (76.204 kg)  03/27/14 169 lb (76.658 kg)  01/14/14 168 lb (76.204 kg)  12/26/13 169 lb 3.2 oz (76.749 kg)  12/24/13 165 lb (74.844 kg)    BMI:  Body mass index is 22.06 kg/(m^2).  Estimated Nutritional Needs:  Kcal:  1850-2100  Protein:  85-95 grams  Fluid:  1.8 - 2.1 L/day  Skin:  Reviewed, no issues  Diet Order:  Diet heart healthy/carb modified Room service appropriate?: Yes; Fluid consistency:: Thin  EDUCATION NEEDS:  Education needs addressed   Intake/Output Summary (Last 24 hours) at 12/04/14 1255 Last data filed at 12/04/14 0715  Gross per 24 hour  Intake    120 ml  Output    200 ml  Net    -80 ml    Last BM:  7/5  Corrin Parker, MS, RD, LDN Pager # [phone] After hours/ weekend pager # [phone]

## 2014-12-04 NOTE — Progress Notes (Addendum)
[facility] PHYSICAL MEDICINE & REHABILITATION     PROGRESS NOTE    Subjective/Complaints: Had a good night. Says he slept very well. Anxious for breakfast because he's hungry!!.   ROS: Pt denies fever, rash/itching, headache, blurred or double vision, nausea, vomiting, abdominal pain, diarrhea, chest pain, shortness of breath, palpitations, dysuria, dizziness, neck or back pain, bleeding, anxiety, or depression   Objective: Vital Signs: Blood pressure 156/83, pulse 91, temperature 98.2 F (36.8 C), temperature source Oral, resp. rate 20, height 6' (1.829 m), weight 73.8 kg (162 lb 11.2 oz), SpO2 95 %. Dg Chest Port 1 View  12/03/2014   CLINICAL DATA:  Tachypnea.  History of pneumonia  EXAM: PORTABLE CHEST - 1 VIEW  COMPARISON:  Chest x-ray from yesterday  FINDINGS: Stable pattern of asymmetric bilateral airspace disease. No effusion or diffuse septal thickening.  Stable cardiomegaly and aortic contours.  IMPRESSION: Stable bilateral airspace disease, again favored to reflect pneumonia.   Electronically Signed   By: Monte Fantasia M.D.   On: 12/03/2014 00:42   Dg Chest Port 1 View  12/02/2014   CLINICAL DATA:  Increasing shortness of breath. Hospital admission for fever.  EXAM: PORTABLE CHEST - 1 VIEW  COMPARISON:  11/29/2014.  11/25/2014.  FINDINGS: Low volume chest. This accentuates the size of the cardiopericardial silhouette which is upper limits of normal for projection.  Monitoring leads project over the chest.  LEFT perihilar airspace disease is present. This may represent asymmetric pulmonary edema or pneumonia.  Increased density is present over the RIGHT scapula, favored to be due to bony overlap rather than airspace disease.  IMPRESSION: New LEFT mid lung airspace disease around the LEFT hilum suspicious for pneumonia. Asymmetric/ atypical pulmonary edema less likely. Followup PA and lateral chest X-ray is recommended in 3-4 weeks following trial of antibiotic therapy to ensure  resolution and exclude underlying malignancy.   Electronically Signed   By: Dereck Ligas M.D.   On: 12/02/2014 11:28    Recent Labs  12/03/14 0527 12/04/14 0545  WBC 14.9* 9.9  HGB 10.7* 11.1*  HCT 32.4* 33.9*  PLT 390 466*    Recent Labs  12/03/14 0527 12/04/14 0545  NA 142 144  K 3.2* 2.8*  CL 103 103  GLUCOSE 147* 137*  BUN 23* 28*  CREATININE 0.91 0.89  CALCIUM 8.5* 8.9   CBG (last 3)  No results for input(s): GLUCAP in the last 72 hours.  Wt Readings from Last 3 Encounters:  12/04/14 73.8 kg (162 lb 11.2 oz)  11/28/14 78.9 kg (173 lb 15.1 oz)  11/25/14 78.291 kg (172 lb 9.6 oz)    Physical Exam:  Constitutional: He appears well-developed and well-nourished. No distress.  Thin  male.  HENT:  Head: Normocephalic and atraumatic.  Right Ear: External ear normal.  Left Ear: External ear normal.  Eyes: Conjunctivae are normal. Pupils are equal, round, and reactive to light. Right eye exhibits no discharge. Left eye exhibits no discharge. No scleral icterus.  Neck: Normal range of motion. Neck supple. No JVD present. No tracheal deviation present. No thyromegaly present.  Cardiovascular: Normal rate. An irregular rhythm present. Murmur heard. Respiratory: Effort normal. No tachypnea. No respiratory distress. He has decreased breath sounds in the right lower field and the left lower field. He has wheezes. He has rhonchi. He exhibits no tenderness.  Decreased rhonchi  GI: Soft. Bowel sounds are normal. He exhibits no distension. There is no tenderness. There is no rebound and no guarding.  Musculoskeletal: He  exhibits no edema or tenderness.  Lymphadenopathy:   He has no cervical adenopathy.  Neurological: He is alert.  Oriented to self and hospital. initiates more conversation. Discussed his current health situation. Moves all 4's. Slow to initiate movement of all limbs. Strength 3+ deltoid, bicep, tricep, wrist, HI. LE: 2/5 HF, 3- ke, 3+ adf/apf in the  le's. Limited insight and awareness. Decreased processing, memory but showing improvement. No focal CN abnl noted.  Skin: Skin is warm and dry. He is not diaphoretic.  Flaky skin BLE  Psychiatric: His affect is more dynamic. He's initiating more.  Assessment/Plan: 1. 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 Physiatrist is providing close team supervision and 24 hour management of active medical problems listed below. Physiatrist and rehab team continue to assess barriers to discharge/monitor patient progress toward functional and medical goals. FIM:                   Comprehension Comprehension Mode: Auditory Comprehension: 2-Understands basic 25 - 49% of the time/requires cueing 51 - 75% of the time  Expression Expression Mode: Verbal Expression: 2-Expresses basic 25 - 49% of the time/requires cueing 50 - 75% of the time. Uses single words/gestures.  Social Interaction Social Interaction: 3-Interacts appropriately 50 - 74% of the time - May be physically or verbally inappropriate.  Problem Solving Problem Solving: 2-Solves basic 25 - 49% of the time - needs direction more than half the time to initiate, plan or complete simple activities  Memory Memory: 1-Recognizes or recalls less than 25% of the time/requires cueing greater than 75% of the time  Medical Problem List and Plan: 1. 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 DVT Prophylaxis/Anticoagulation: Pharmaceutical: Lovenox 3. Pain Management: Tylenol prn of arthritic pain---effective at present 4. Mood: LCSW to follow for evaluation and support. in good spirits at present 5. Neuropsych: This patient is still not capable of making decisions on his own behalf. 6. Skin/Wound Care: routine pressure relief  measures. Maintain adequate nutrition and hydration status.  7. Fluids/Electrolytes/Nutrition: need to push fluids. Appetite/drive to eat is improving   -replete potassium  -recheck labs tomorrow 8. RMSF: Two weeks of doxycyline recommended for treatment--D # 8/14. Thru 7.12 9. Thrombocytopenia: platelets 137k 10. Leucocytosis: wbc's down to 9.9 11. Acute renal insufficiency: Improving. Monitor with serial checks.  12. Acute on chronic systolic/diastolic CHF: Checking daily weights. Toprol increased to 50 mg bid and lasix changed to po. Continue ACE1 also  -may be swinging too much to dry end of spectrum 13. HTN: Monitor every 8 hours and continue to titrate medications for tighter control.  14. Prediabetes: Hgb A1C- 6.2.family ed on CM/HH diet.  15. Hypokalemia: Due to diuresis. Will add daily supplement.  16. PAF/MAT: event monitor in place    LOS (Days) 1 A FACE TO FACE EVALUATION WAS PERFORMED  SWARTZ,ZACHARY T 12/04/2014 9:02 AM

## 2014-12-04 NOTE — Progress Notes (Signed)
Patient information reviewed and entered into eRehab system by Ciarrah Rae, RN, CRRN, PPS Coordinator.  Information including medical coding and functional independence measure will be reviewed and updated through discharge.     Per nursing patient was given "Data Collection Information Summary for Patients in Inpatient Rehabilitation Facilities with attached "Privacy Act Statement-Health Care Records" upon admission.  

## 2014-12-04 NOTE — Progress Notes (Signed)
Meredith Staggers, MD Physician Signed Physical Medicine and Rehabilitation Consult Note 12/02/2014 8:49 AM  Related encounter: ED to Hosp-Admission (Discharged) from 11/27/2014 in [place] Collapse All        Physical Medicine and Rehabilitation Consult  Reason for Consult: Right sided weakness, confusion and encephalopathy.  Referring Physician: Dr. Halford Chessman   HPI: Jeffrey [name] is a 81 y.o. RH-male with history of HTN, DM type 2, macular degeneration who was admitted on 11/27/14 with confusion,difficulty walking, fever and mild right sided weakness with concerns of encephalitis. MRI brain done revealing He was found to be anemic with thrombocytopenia, leukocytosis with work up indicating [facility] Fever. MRI brain done revealing acute nonhemorrhagic white matter infarct in the anterior right frontal lobe and LP with glucose 70 and protein111. He developed septic shock with respiratory failure requiring intubation and was treated with broad spectrum antibiotics due to presumed PNA. He was extubated to  on 07/01 and swallow evaluation done with initiation of regular diet. EEG revealed moderate diffuse slowing without epileptiform discharges. Carotid dopplers with L- 80-89% ICA stenosis. 2D echo with EF 40-45% with diffuse hypokinesis and moderate calcification AV. Neurology recommends low dose ASA for secondary stroke prevention and to follow up with [facility] past discharge for asymptomatic L-ICA stenosis. Cardiology consulted 07/03 due to elevated cardiac enzymes and bigeminy with PAF. Xarelto recommended by Dr. Bronson Ing once cleared by neurology. Therapy evaluations done this weekend and CIR recommended by MD and rehab team.    Review of Systems  Constitutional: Positive for fever. Negative for chills.  Eyes: Negative for blurred vision.  Respiratory: Negative for cough.  Cardiovascular: Negative for chest pain.  Gastrointestinal:  Negative for heartburn.  Musculoskeletal: Negative for myalgias.  Neurological: Positive for focal weakness. Negative for headaches.  Psychiatric/Behavioral: Positive for memory loss. The patient has insomnia.      Past Medical History  Diagnosis Date  . Hypertension   . Hypercholesteremia   . Impaired fasting glucose   . Prostate cancer radiation + seeding implant (Dr.[name])  . Rotator cuff tear, right 10/2010    supraspinatous and infraspinatous  . Diabetes mellitus   . Macular degeneration   . Heart murmur     mild aortic stenosis  . Radiation proctitis 11/2012    treated with APC ablation and Canasa suppositories (Dr. Hilarie Fredrickson)  . TIA (transient ischemic attack) 10/02/13    hosp at [facility] x 1 night  . 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 Aortic atherosclerosis 11/25/2014  . Acute encephalopathy 11/27/2014  . 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 Multiple lacunar infarcts   . Acute CVA (cerebrovascular accident)     Past Surgical History  Procedure Laterality Date  . Tonsillectomy and adenoidectomy  age 74  . Prostate seed implant    . Rotator cuff repair  06/16/2011    right (Dr. Berenice Primas)  . Colonoscopy N/A 11/28/2012    Procedure: COLONOSCOPY; Surgeon: Jerene Bears, MD; Location: [facility] ENDOSCOPY; Service: Gastroenterology; Laterality: N/A;    Family History  Problem Relation Age of Onset  . Heart disease Father 38    Died suddenly. No diagnosis  . Stroke Sister   . Dementia Mother   . Diabetes Mother   . Cancer Daughter     ovarian (?) vs other male cancer; s/p hyst doing well  . Diabetes Daughter  GDM, and now AODM  . Heart disease Son     congestive heart failure--improved    Social History:  reports that he quit smoking about 34 years  ago. His smoking use included Cigarettes. He has a 20 pack-year smoking history. He has never used smokeless tobacco. He reports that he drinks about 1.2 oz of alcohol per week. He reports that he does not use illicit drugs.    Allergies: No Known Allergies    Medications Prior to Admission  Medication Sig Dispense Refill  . Acetaminophen (TYLENOL ARTHRITIS PAIN PO) Take 2 tablets by mouth 2 (two) times daily.     [name] amLODipine (NORVASC) 2.5 MG tablet TAKE 1 TABLET (2.5 MG TOTAL) BY MOUTH DAILY. 90 tablet 1  . aspirin 81 MG tablet Take 81 mg by mouth 2 (two) times daily.     [name] doxylamine, Sleep, (EQ SLEEP AID) 25 MG tablet Take 25 mg by mouth at bedtime as needed for sleep.    . hydrocortisone cream 1 % Apply 1 application topically as needed (for ankles).    . lovastatin (MEVACOR) 20 MG tablet Take 2 tablets (40 mg total) by mouth at bedtime. 180 tablet 1  . metoprolol succinate (TOPROL-XL) 50 MG 24 hr tablet TAKE 1 TABLET BY MOUTH TWICE A DAY TAKE WITH OR IMMEDIATELY FOLLOWING A MEAL 180 tablet 0  . Multiple Vitamins-Minerals (PRESERVISION AREDS) CAPS Take 1 capsule by mouth every morning.    . naproxen sodium (ANAPROX) 220 MG tablet Take 220 mg by mouth 2 (two) times daily as needed (for pain).      Home: Home Living Family/patient expects to be discharged to:: Private residence Living Arrangements: Spouse/significant other Available Help at Discharge: Family, Available 24 hours/day Type of Home: House Home Access: Stairs to enter Technical brewer of Steps: 3 Entrance Stairs-Rails: Right Home Layout: One level Additional Comments: family very supportive  Functional History: Prior Function Level of Independence: Independent Comments: Pt very active and enjoyed gardening  Functional Status:  Mobility: Bed Mobility Overal bed mobility: Needs Assistance Bed Mobility: Rolling, Sidelying to Sit Rolling: Mod  assist Sidelying to sit: Mod assist Supine to sit: Mod assist Sit to supine: Min assist General bed mobility comments: required max directional and tactile cues to initiate task and complete, modAx2 for trunk elevation and LE management off bed Transfers Overall transfer level: Needs assistance Equipment used: (2 person lift with gait belt) Transfers: Sit to/from Stand, Stand Pivot Transfers Sit to Stand: Max assist, +2 physical assistance Stand pivot transfers: Max assist, +2 physical assistance General transfer comment: pt extremely retropulsive with report of saying "I"m going to fall on top of that thing." referring to IV pole. max tactile cues to bring hips over feet      ADL: ADL Overall ADL's : Needs assistance/impaired Eating/Feeding: NPO Grooming: Wash/dry face, Oral care, Wash/dry hands, Moderate assistance, Sitting Upper Body Bathing: Maximal assistance, Sitting Lower Body Bathing: Maximal assistance, Sit to/from stand Upper Body Dressing : Maximal assistance Lower Body Dressing: Total assistance, Sit to/from stand Toilet Transfer: Total assistance Toileting- Clothing Manipulation and Hygiene: Total assistance General ADL Comments: Pt moves slowly and requires multiple rest breaks  Cognition: Cognition Overall Cognitive Status: Impaired/Different from baseline Orientation Level: Oriented to person, Oriented to place, Oriented to time, Disoriented to situation Cognition Arousal/Alertness: Awake/alert Behavior During Therapy: Flat affect Overall Cognitive Status: Impaired/Different from baseline Area of Impairment: Orientation, Attention, Following commands, Safety/judgement, Awareness, Problem solving, Memory Orientation Level: Place, Time, Situation Current Attention Level:  Sustained Memory: Decreased recall of precautions, Decreased short-term memory Following Commands: Follows one step commands inconsistently, Follows one step commands with increased  time Safety/Judgement: Decreased awareness of safety, Decreased awareness of deficits Awareness: Emergent Problem Solving: Slow processing, Decreased initiation, Difficulty sequencing, Requires verbal cues, Requires tactile cues General Comments: Pt requires increast time to process info and to initiate activity . pt fluctuating in/out of confusion. Pt with significant delay in response time   Blood pressure 154/67, pulse 55, temperature 98.7 F (37.1 C), temperature source Oral, resp. rate 18, height 5\' 11"  (1.803 m), weight 78.9 kg (173 lb 15.1 oz), SpO2 98 %. Physical Exam  Constitutional: He appears well-developed.  HENT:  Head: Normocephalic.  Eyes: Pupils are equal, round, and reactive to light.  Neck: Normal range of motion.  Cardiovascular: Normal rate.  Respiratory: Effort normal.  GI: Soft.  Musculoskeletal:  Moves all 4's. Slow to initiate movement of all limbs. Strength 3/5 prox to 3+ distally in the UE's and 2/5 HF to 3+ adf/apf in the le's. Limited insight and awareness. Decreased processing, memory. No focal CN abnl  Psychiatric:  flat     Lab Results Last 24 Hours    No results found for this or any previous visit (from the past 24 hour(s)).    Imaging Results (Last 48 hours)    No results found.    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 Does the need for close, 24 hr/day medical supervision in concert with the patient's rehab needs make it unreasonable for this patient to be served in a less intensive setting? Yes 2. Co-Morbidities requiring supervision/potential complications: htn, sepsis, aki 3. Due to bladder management, bowel management, safety, skin/wound care, disease management, medication administration, pain management and patient education, does the patient require 24 hr/day rehab nursing? Yes 4. Does the patient require coordinated care of a physician, rehab nurse, PT (1-2 hrs/day, 5 days/week), OT  (1-2 hrs/day, 5 days/week) and SLP (1-2 hrs/day, 5 days/week) to address physical and functional deficits in the context of the above medical diagnosis(es)? Yes Addressing deficits in the following areas: balance, endurance, locomotion, strength, transferring, bowel/bladder control, bathing, dressing, feeding, grooming, toileting, cognition, speech, language, swallowing and psychosocial support 5. Can the patient actively participate in an intensive therapy program of at least 3 hrs of therapy per day at least 5 days per week? Yes 6. The potential for patient to make measurable gains while on inpatient rehab is excellent 7. Anticipated functional outcomes upon discharge from inpatient rehab are supervision with PT, supervision with OT, supervision and min assist with SLP. 8. Estimated rehab length of stay to reach the above functional goals is: 16-22 days 9. Does the patient have adequate social supports and living environment to accommodate these discharge functional goals? Yes 10. Anticipated D/C setting: Home 11. Anticipated post D/C treatments: HH therapy and Outpatient therapy 12. Overall Rehab/Functional Prognosis: excellent  RECOMMENDATIONS: This patient's condition is appropriate for continued rehabilitative care in the following setting: CIR Patient has agreed to participate in recommended program. N/A Note that insurance prior authorization may be required for reimbursement for recommended care.  Comment: Pt with excellent family supports. Rehab Admissions Coordinator to follow up.  Thanks,  Meredith Staggers, MD, Mellody Drown     12/02/2014       Revision History     Date/Time User Provider Type Action   12/02/2014 11:46 AM Meredith Staggers, MD Physician Sign   12/02/2014 9:11 AM Bary Leriche,  PA-C Physician Assistant Pend   View Details Report       Routing History     Date/Time From To Method   12/02/2014 11:46 AM Meredith Staggers, MD Meredith Staggers, MD In Basket    12/02/2014 11:46 AM Meredith Staggers, MD Rita Ohara, MD In Basket

## 2014-12-04 NOTE — Plan of Care (Addendum)
Problem: Food- and Nutrition-Related Knowledge Deficit (NB-1.1) Goal: Nutrition education Formal process to instruct or train a patient/client in a skill or to impart knowledge to help patients/clients voluntarily manage or modify food choices and eating behavior to maintain or improve health. Outcome: Completed/Met Date Met:  12/04/14  RD consulted for nutrition education.     Lab Results  Component Value Date    HGBA1C 6.2* 11/30/2014    RD provided "Carbohydrate Counting for People with Diabetes" handout from the Academy of [employer] Dietetics. Discussed different food groups and their effects on blood sugar, emphasizing carbohydrate-containing foods. Provided list of carbohydrates and recommended serving sizes of common foods.  Discussed importance of controlled and consistent carbohydrate intake throughout the day. Provided examples of ways to balance meals/snacks and encouraged intake of high-fiber, whole grain complex carbohydrates. Teach back method used.  Expect good compliance.  Corrin Parker, MS, RD, LDN Pager # [phone] After hours/ weekend pager # [phone]

## 2014-12-04 NOTE — Progress Notes (Signed)
Retta Diones, RN Rehab Admission Coordinator Signed Physical Medicine and Rehabilitation PMR Pre-admission 12/03/2014 11:05 AM  Related encounter: ED to Hosp-Admission (Discharged) from 11/27/2014 in [place] Collapse All   PMR Admission Coordinator Pre-Admission Assessment  Patient: Jeffrey [name] is an 81 y.o., male MRN: [id number] DOB: December 11, 1932 Height: 5\' 11"  (180.3 cm) Weight: 78.9 kg (173 lb 15.1 oz)  Insurance Information HMO: No PPO: PCP: IPA: 80/20: OTHER:  PRIMARY: Medicare A/B Policy#: [id number] Subscriber: Jeffrey [name] CM Name: Phone#: Fax#:  Pre-Cert#: Employer: Retired Benefits: Phone #: Name: Checked in [place]. Date: 10/30/99 Deduct: $1288 Out of Pocket Max: None Life Max: unlimited CIR: 100% SNF: 100 days Outpatient: 80% Co-Pay: 20% Home Health: 100% Co-Pay: none DME: 80% Co-Pay: 20% Providers: patients choice  SECONDARY: BCBS supp Policy#: [id number] Subscriber: Jeffrey [name] CM Name: Phone#: Fax#:  Pre-Cert#: Employer: Retired Benefits: Phone #: [phone] Name:  Eff. 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 RH-male with history of HTN, DM type 2, macular degeneration who was admitted on 11/27/14 with  confusion,difficulty walking, fever and mild right sided weakness with concerns of encephalitis. MRI brain done revealing He was found to be anemic with thrombocytopenia, leukocytosis with work up indicating [facility] Fever. MRI brain done revealing acute nonhemorrhagic white matter infarct in the anterior right frontal lobe and LP with glucose 70 and protein111. He developed septic shock with respiratory failure requiring intubation and was treated with broad spectrum antibiotics due to presumed PNA. He was extubated to [facility] on 07/01 and swallow evaluation done with initiation of regular diet. EEG revealed moderate diffuse slowing without epileptiform discharges. Carotid dopplers with L- 80-89% ICA stenosis. 2D echo with EF 40-45% with diffuse hypokinesis and moderate calcification AV. Neurology recommends low dose ASA for secondary stroke prevention and to follow up with [facility] past discharge for asymptomatic L-ICA stenosis. Cardiology consulted 07/03 due to elevated cardiac enzymes and bigeminy with question of PAF. Dr. Caryl Comes evaluated patient and felt that patient with MAT and no definite A fib. He recommended ASA and 30 day event monitor if no A fib detected in house. Patient developed SOB with tachypnea on 07/03 pm and CXR done revealing LML airspace disease concerning for PNA. Therapy evaluations done this weekend and CIR recommended by MD and rehab team. Note: Daughter is getting an event monitor from Dr. Olin Pia office and will bring it back to hospital to be put on patient.   Total: 2=NIH  Past Medical History  Past Medical History  Diagnosis Date  . Hypertension   . Hypercholesteremia   . Impaired fasting glucose   . Prostate cancer radiation + seeding implant (Dr.[name])  . Rotator cuff tear, right 10/2010    supraspinatous and infraspinatous  . Diabetes mellitus   . Macular degeneration   . Heart murmur     mild aortic stenosis  .  Radiation proctitis 11/2012    treated with APC ablation and Canasa suppositories (Dr. Hilarie Fredrickson)  . TIA (transient ischemic attack) 10/02/13    hosp at [facility] x 1 night  . Type 2 diabetes  mellitus with microalbuminuria or microproteinuria 01/15/2014  . Aortic atherosclerosis 11/25/2014  . Acute encephalopathy 11/27/2014  . 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 Multiple lacunar infarcts   . Acute CVA (cerebrovascular accident)   . Acute on chronic combined systolic and diastolic CHF (congestive heart failure)     Family History  family history includes Cancer in his daughter; Dementia in his mother; Diabetes in his daughter and mother; Heart disease in his son; Heart disease (age of onset: 71) in his father; Stroke in his sister.  Prior Rehab/Hospitalizations: Had RTC repair 3 yrs ago and had outpatient therapy.  Has the patient had major surgery during 100 days prior to admission? No  Current Medications   Current facility-administered medications:  . antiseptic oral rinse (CPC / CETYLPYRIDINIUM CHLORIDE 0.05%) solution 7 mL, 7 mL, Mouth Rinse, QID, Wilhelmina Mcardle, MD, 7 mL at 12/03/14 1152 . aspirin chewable tablet 81 mg, 81 mg, Oral, Daily, Chesley Mires, MD, 81 mg at 12/03/14 1051 . budesonide (PULMICORT) nebulizer solution 0.25 mg, 0.25 mg, Nebulization, BID, Barton Dubois, MD, 0.25 mg at 12/03/14 0800 . chlorhexidine (PERIDEX) 0.12 % solution 15 mL, 15 mL, Mouth Rinse, BID, Wilhelmina Mcardle, MD, 15 mL at 12/03/14 0744 . doxycycline (VIBRA-TABS) tablet 100 mg, 100 mg, Oral, Q12H, Eudelia Bunch, RPH, 100 mg at 12/03/14 1051 . furosemide (LASIX) tablet 40 mg, 40 mg, Oral, Daily, Lelon Perla, MD, 40 mg at 12/03/14 1241 . hydrocortisone cream 1 % 1 application, 1 application, Topical, PRN, Ivor Costa, MD . ipratropium-albuterol (DUONEB) 0.5-2.5 (3) MG/3ML nebulizer solution 3 mL, 3 mL, Nebulization, Q6H  PRN, Barton Dubois, MD . lisinopril (PRINIVIL,ZESTRIL) tablet 20 mg, 20 mg, Oral, Daily, Barton Dubois, MD, 20 mg at 12/03/14 1051 . metoprolol succinate (TOPROL-XL) 24 hr tablet 50 mg, 50 mg, Oral, BID WC, Lelon Perla, MD . multivitamin-lutein (OCUVITE-LUTEIN) capsule 1 capsule, 1 capsule, Oral, Daily, Barton Dubois, MD . [DISCONTINUED] ondansetron (ZOFRAN) tablet 4 mg, 4 mg, Oral, Q6H PRN **OR** ondansetron (ZOFRAN) injection 4 mg, 4 mg, Intravenous, Q4H PRN, Wilhelmina Mcardle, MD . pravastatin (PRAVACHOL) tablet 40 mg, 40 mg, Oral, q1800, Chesley Mires, MD, 40 mg at 12/02/14 1812 . sodium chloride 0.9 % injection 3 mL, 3 mL, Intravenous, Q12H, Ivor Costa, MD, 3 mL at 12/03/14 1100 . white petrolatum (VASELINE) gel, , Topical, PRN, Barton Dubois, MD, 0.2 application at 00/86/76 1731  Patients Current Diet: Diet regular Room service appropriate?: Yes; Fluid consistency:: Thin Diet - low sodium heart healthy  Precautions / Restrictions Precautions Precautions: Fall Restrictions Weight Bearing Restrictions: No   Has the patient had 2 or more falls or a fall with injury in the past year?No  Prior Activity Level Community (5-7x/wk): Went out daily. Was driving. Worked in his garden and mowed Museum/gallery exhibitions officer. Enjoyed fishing.  Home Assistive Devices / Equipment Home Assistive Devices/Equipment: None  Prior Device Use: Indicate devices/aids used by the patient prior to current illness, exacerbation or injury? None of the above. Did not use a device. Was independent.  Prior Functional Level Prior Function Level of Independence: Independent Comments: Pt very active and enjoyed gardening   Self Care: Did the patient need help bathing, dressing, using the toilet or eating? Independent  Indoor Mobility: Did the patient need assistance with walking from room to room (with or without device)? Independent  Stairs: Did the patient need assistance with internal or external stairs (with  or without device)?  Independent  Functional Cognition: Did the patient need help planning regular tasks such as shopping or remembering to take medications? Independent  Current Functional Level Cognition  Arousal/Alertness: Awake/alert Overall Cognitive Status: Impaired/Different from baseline Current Attention Level: Selective Orientation Level: Oriented to person, Oriented to place, Oriented to situation, Disoriented to time Following Commands: Follows one step commands inconsistently, Follows multi-step commands inconsistently Safety/Judgement: Decreased awareness of safety General Comments: pt with delayed processing physically and verbally but appropriate with responses throughout session. ,Flat affect is a change for him per family. Gaze avoidance but performs when cued.  Attention: Selective Selective Attention: Impaired Selective Attention Impairment: Verbal basic, Functional basic Memory: Impaired Memory Impairment: Storage deficit, Retrieval deficit Awareness: Impaired Awareness Impairment: Emergent impairment Problem Solving: Impaired Problem Solving Impairment: Verbal basic, Functional basic Behaviors: Impulsive Safety/Judgment: Impaired   Extremity Assessment (includes Sensation/Coordination)  Upper Extremity Assessment: Defer to OT evaluation RUE Deficits / Details: Pt with tremor bil. UE. Rt with h/o rotator cuff tear and resultant ROM LUE Deficits / Details: Pt with tremor bil.   Lower Extremity Assessment: Generalized weakness (able to initiate mvmt )    ADLs  Overall ADL's : Needs assistance/impaired Eating/Feeding: NPO Grooming: Wash/dry hands, Minimal assistance, Standing Upper Body Bathing: Maximal assistance, Sitting Lower Body Bathing: Maximal assistance, Sit to/from stand Upper Body Dressing : Maximal assistance Lower Body Dressing: Total assistance, Sit to/from stand Toilet Transfer: Moderate assistance, BSC, Ambulation, RW Toileting-  Clothing Manipulation and Hygiene: Moderate assistance, Sit to/from stand Functional mobility during ADLs: Moderate assistance, Minimal assistance General ADL Comments: Pt initially mod assist for standing and mobility secondary to increased posterior LOB. Progressed to min assist level after standing for a short period of time and when standing at the sink to wash his hands post toileting.     Mobility  Overal bed mobility: Needs Assistance Bed Mobility: Sidelying to Sit, Rolling Rolling: Supervision Sidelying to sit: Mod assist Supine to sit: Mod assist Sit to supine: Min assist General bed mobility comments: Pt in chair    Transfers  Overall transfer level: Needs assistance Equipment used: Rolling walker (2 wheeled) Transfers: Sit to/from Stand Sit to Stand: Mod assist Stand pivot transfers: Mod assist General transfer comment: Assist to bring hips up and for balance. Pt with posterior lean.    Ambulation / Gait / Stairs / Wheelchair Mobility  Ambulation/Gait Ambulation/Gait assistance: +2 physical assistance, Min assist Ambulation Distance (Feet): 100 Feet Assistive device: Rolling walker (2 wheeled) General Gait Details: Assist for balance. Verbal cues to stay in walker and to assist turning walker. Gait Pattern/deviations: Step-through pattern, Decreased stride length, Drifts right/left Gait velocity: decr Gait velocity interpretation: Below normal speed for age/gender    Posture / Balance Dynamic Sitting Balance Sitting balance - Comments: able to sit EOB without UE support, kept wt more to right side Balance Overall balance assessment: Needs assistance Sitting-balance support: No upper extremity supported, Feet supported Sitting balance-Leahy Scale: Fair Sitting balance - Comments: able to sit EOB without UE support, kept wt more to right side Postural control: Posterior lean Standing balance support: Bilateral upper extremity supported Standing  balance-Leahy Scale: Poor Standing balance comment: Walker and min A for static standing once posterior lean corrected.    Special needs/care consideration BiPAP/CPAPNo CPM No Continuous Drip IV No Dialysis No  Life Vest No Oxygen Currently has 02 Carbon. Did not use 02 at home Special Bed No Trach Size No Wound Vac (area) No  Skin Has dry skin and ankles itch  Bowel  mgmt: Had BM this am 07/05/6 Bladder mgmt: Voiding in urinal with incontinence at times. Has urgency. Diabetic mgmt Was "borderline diabetic" at home, not on medications at home.    Previous Home Environment Living Arrangements: Spouse/significant other Lives With: Spouse Available Help at Discharge: Family, Available 24 hours/day Type of Home: House Home Layout: One level Home Access: Stairs to enter Entrance Stairs-Rails: Right Entrance Stairs-Number of Steps: 3 Bathroom Shower/Tub: Chiropodist: Standard Home Care Services: No Additional Comments: family very supportive  Discharge Living Setting Plans for Discharge Living Setting: Patient's home, House, Lives with (comment) (Lives with wife and 2 dogs.) Type of Home at Discharge: House Discharge Home Layout: One level Discharge Home Access: Stairs to enter Entrance Stairs-Number of Steps: 3 Does the patient have any problems obtaining your medications?: No  Social/Family/Support Systems Patient Roles: Spouse, Parent, Other (Comment) (Has a wife, children, and grandchildren.) Contact Information: Jeffrey [name] - spouse  Anticipated Caregiver: wife  Anticipated Caregiver's Contact Information: Jeffrey [name] - wife (h) [phone] Ability/Limitations of Caregiver: Wife can assist. Caregiver Availability: 24/7 Discharge Plan Discussed with Primary Caregiver: Yes Is Caregiver In Agreement with Plan?: Yes Does Caregiver/Family have Issues with Lodging/Transportation while Pt is in Rehab?:  No  Goals/Additional Needs Patient/Family Goal for Rehab: PT/OT supervision, ST supervision and min assist goals Expected length of stay: 16-22 days Cultural Considerations: None Dietary Needs: Regular diet, thin liquids Equipment Needs: TBD Pt/Family Agrees to Admission and willing to participate: Yes Program Orientation Provided & Reviewed with Pt/Caregiver Including Roles & Responsibilities: Yes  Decrease burden of Care through IP rehab admission: N/A  Possible need for SNF placement upon discharge: Not anticipated  Patient Condition: This patient's condition remains as documented in the consult dated 12/02/14, in which the Rehabilitation Physician determined and documented that the patient's condition is appropriate for intensive rehabilitative care in an inpatient rehabilitation facility. Will admit to inpatient rehab today.  Preadmission Screen Completed By: Retta Diones, 12/03/2014 3:01 PM ______________________________________________________________________  Discussed status with Dr. Naaman Plummer on 12/03/14 at 1502 and received telephone approval for admission today.  Admission Coordinator: Retta Diones, time1502/Date07/05/16          Cosigned by: Meredith Staggers, MD at 12/03/2014 3:41 PM  Revision History     Date/Time User Provider Type Action   12/03/2014 3:41 PM Meredith Staggers, MD Physician Cosign   12/03/2014 3:02 PM Retta Diones, RN Rehab Admission Coordinator Sign

## 2014-12-04 NOTE — Plan of Care (Signed)
Problem: RH BLADDER ELIMINATION Goal: RH STG MANAGE BLADDER WITH ASSISTANCE STG Manage Bladder With Assistance  Outcome: Not Progressing 2 incontinent episodes overnight

## 2014-12-04 NOTE — H&P (View-Only) (Signed)
Physical Medicine and Rehabilitation Admission H&P    Chief Complaint  Patient presents with  . Right sided weakness, visual deficits, encephalopathy.     HPI:   Jeffrey [name] is a 81 y.o. RH-male with history of HTN, DM type 2, macular degeneration who was admitted on 11/27/14 with confusion,difficulty walking, fever and mild right sided weakness with concerns of encephalitis. MRI brain done revealing He was found to be anemic with thrombocytopenia, leukocytosis with work up indicating [facility] Fever. MRI brain done revealing acute nonhemorrhagic white matter infarct in the anterior right frontal lobe and LP with glucose 70 and protein111. He developed septic shock with respiratory failure requiring intubation and was treated with broad spectrum antibiotics due to presumed PNA. He was extubated to [place] on 07/01 and swallow evaluation done with initiation of regular diet. EEG revealed moderate diffuse slowing without epileptiform discharges. Carotid dopplers with L- 80-89% ICA stenosis. 2D echo with EF 40-45% with diffuse hypokinesis and moderate calcification AV. Neurology recommends low dose ASA for secondary stroke prevention and to follow up with [facility] past discharge for asymptomatic L-ICA stenosis. Cardiology consulted 07/03 due to elevated cardiac enzymes and bigeminy with question of PAF. Dr. Caryl Comes evaluated patient and felt that patient with MAT and no definite A fib. He recommended ASA and 30 day monitor if no A fib detected in house.   Patient developed SOB with tachypnea on 07/03 pm and CXR done revealing LML airspace disease concerning for PNA. No additional antibiotics needed per ID and patient treated with IV diuresis for acute on chronic combined systolic/diastolic CHF.   Therapy ongoing and CIR recommended by MD and rehab team   Review of Systems  Constitutional: Positive for weight loss.  HENT: Negative for hearing loss.   Eyes: Negative for blurred vision.    Respiratory: Negative for cough and shortness of breath.   Cardiovascular: Negative for chest pain and palpitations.  Gastrointestinal: Negative for heartburn and nausea.       Anorexia  Genitourinary: Positive for urgency and frequency.       Gets up 4-5 times at nights.   Musculoskeletal: Positive for myalgias and joint pain (bilateral hands OA).  Skin: Positive for rash (resolving). Negative for itching.  Neurological: Positive for focal weakness (right shoulder due to RTC pathology). Negative for dizziness, tingling and headaches.  Endo/Heme/Allergies: Does not bruise/bleed easily.  Psychiatric/Behavioral: Positive for memory loss. The patient has insomnia. The patient is not nervous/anxious.       Past Medical History  Diagnosis Date  . Hypertension   . Hypercholesteremia   . Impaired fasting glucose   . Prostate cancer radiation + seeding implant (Dr.[name])  . Rotator cuff tear, right 10/2010    supraspinatous and infraspinatous  . Diabetes mellitus   . Macular degeneration   . Heart murmur     mild aortic stenosis  . Radiation proctitis 11/2012    treated with APC ablation and Canasa suppositories (Dr. Hilarie Fredrickson)  . TIA (transient ischemic attack) 10/02/13    hosp at [facility] x 1 night  . 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 Aortic atherosclerosis 11/25/2014  . Acute encephalopathy 11/27/2014  . 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 Multiple lacunar infarcts   . Acute CVA (cerebrovascular accident)   . Acute on chronic combined systolic and diastolic CHF (congestive heart failure)     Past Surgical History  Procedure Laterality Date  .  Tonsillectomy and adenoidectomy  age 3  . Prostate seed implant    . Rotator cuff repair  06/16/2011    right (Dr. Berenice Primas)  . Colonoscopy N/A 11/28/2012    Procedure: COLONOSCOPY;  Surgeon: Jerene Bears, MD;  Location: [facility] ENDOSCOPY;  Service: Gastroenterology;   Laterality: N/A;    Family History  Problem Relation Age of Onset  . Heart disease Father 70    Died suddenly.  No diagnosis  . Stroke Sister   . Dementia Mother   . Diabetes Mother   . Cancer Daughter     ovarian (?) vs other male cancer; s/p hyst doing well  . Diabetes Daughter     GDM, and now AODM  . Heart disease Son     congestive heart failure--improved     Social History:  Married. 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 Independent and does a lot of gardening. He reports that he quit smoking about 34 years ago. His smoking use included Cigarettes. He has a 20 pack-year smoking history. He has never used smokeless tobacco. He reports that he drinks about 2-4 beers daily. He reports that he does not use illicit drugs.    Allergies: No Known Allergies    Medications Prior to Admission  Medication Sig Dispense Refill  . Acetaminophen (TYLENOL ARTHRITIS PAIN PO) Take 2 tablets by mouth 2 (two) times daily.      [name] amLODipine (NORVASC) 2.5 MG tablet TAKE 1 TABLET (2.5 MG TOTAL) BY MOUTH DAILY. 90 tablet 1  . aspirin 81 MG tablet Take 81 mg by mouth 2 (two) times daily.     [name] doxylamine, Sleep, (EQ SLEEP AID) 25 MG tablet Take 25 mg by mouth at bedtime as needed for sleep.    . hydrocortisone cream 1 % Apply 1 application topically as needed (for ankles).    . lovastatin (MEVACOR) 20 MG tablet Take 2 tablets (40 mg total) by mouth at bedtime. 180 tablet 1  . metoprolol succinate (TOPROL-XL) 50 MG 24 hr tablet TAKE 1 TABLET BY MOUTH TWICE A DAY TAKE WITH OR IMMEDIATELY FOLLOWING A MEAL 180 tablet 0  . Multiple Vitamins-Minerals (PRESERVISION AREDS) CAPS Take 1 capsule by mouth every morning.    . naproxen sodium (ANAPROX) 220 MG tablet Take 220 mg by mouth 2 (two) times daily as needed (for pain).      Home: Home Living Family/patient expects to be discharged to:: Private residence Living Arrangements: Spouse/significant other Available Help at Discharge:  Family, Available 24 hours/day Type of Home: House Home Access: Stairs to enter Technical brewer of Steps: 3 Entrance Stairs-Rails: Right Home Layout: One level Additional Comments: family very supportive  Lives With: Spouse   Functional History: Prior Function Level of Independence: Independent Comments: Pt very active and enjoyed gardening   Functional Status:  Mobility: Bed Mobility Overal bed mobility: Needs Assistance Bed Mobility: Sidelying to Sit, Rolling Rolling: Supervision Sidelying to sit: Mod assist Supine to sit: Mod assist Sit to supine: Min assist General bed mobility comments: pt attempted to initiate supine to sit on his own but was unable to succeed at getting EOB, hand over hand guidance for rolling and then pt was able to roll with only supervision. Mod HHA for SL to sit. Pt was able to scoot hips to EOB with vc's and increased time but no physical assist. Transfers Overall transfer level: Needs assistance Equipment used: Rolling walker (2 wheeled) Transfers: Sit to/from Stand, [employer] Transfers Sit to Stand:  Mod assist Stand pivot transfers: Mod assist General transfer comment: Pt with increased posterior lean in standing resulting in LOB and the need for mod facilitation to maintain balance.      ADL: ADL Overall ADL's : Needs assistance/impaired Eating/Feeding: NPO Grooming: Wash/dry hands, Minimal assistance, Standing Upper Body Bathing: Maximal assistance, Sitting Lower Body Bathing: Maximal assistance, Sit to/from stand Upper Body Dressing : Maximal assistance Lower Body Dressing: Total assistance, Sit to/from stand Toilet Transfer: Moderate assistance, BSC, Ambulation, RW Toileting- Clothing Manipulation and Hygiene: Moderate assistance, Sit to/from stand Functional mobility during ADLs: Moderate assistance, Minimal assistance General ADL Comments: Pt initially mod assist for standing and mobility secondary to increased posterior LOB.   Progressed to min assist level after standing for a short period of time and when standing at the sink to wash his hands post toileting.   Cognition: Cognition Overall Cognitive Status: Impaired/Different from baseline Arousal/Alertness: Awake/alert Orientation Level: Oriented to person, Oriented to place, Oriented to situation, Disoriented to time Attention: Selective Selective Attention: Impaired Selective Attention Impairment: Verbal basic, Functional basic Memory: Impaired Memory Impairment: Storage deficit, Retrieval deficit Awareness: Impaired Awareness Impairment: Emergent impairment Problem Solving: Impaired Problem Solving Impairment: Verbal basic, Functional basic Behaviors: Impulsive Safety/Judgment: Impaired Cognition Arousal/Alertness: Awake/alert Behavior During Therapy: Flat affect Overall Cognitive Status: Impaired/Different from baseline Area of Impairment: Memory, Safety/judgement, Awareness Orientation Level: Place, Time, Situation Current Attention Level: Selective Memory: Decreased short-term memory Following Commands: Follows one step commands inconsistently, Follows multi-step commands inconsistently Safety/Judgement: Decreased awareness of safety Awareness: Intellectual Problem Solving: Slow processing, Difficulty sequencing, Requires verbal cues General Comments: pt with delayed processing physically and verbally but appropriate with responses throughout session. ,Flat affect is a change for him per family. Gaze avoidance but performs when cued.   P Blood pressure 161/81, pulse 92, temperature 97.6 F (36.4 C), temperature source Oral, resp. rate 32, height _0  (1.803 m), weight 78.9 kg (173 lb 15.1 oz), SpO2 92 %. Physical Exam  Nursing note and vitals reviewed. Constitutional: He appears well-developed and well-nourished. No distress.  Thin frail appearing male.   HENT:  Head: Normocephalic and atraumatic.  Right Ear: External ear normal.  Left  Ear: External ear normal.  Eyes: Conjunctivae are normal. Pupils are equal, round, and reactive to light. Right eye exhibits no discharge. Left eye exhibits no discharge. No scleral icterus.  Neck: Normal range of motion. Neck supple. No JVD present. No tracheal deviation present. No thyromegaly present.  Cardiovascular: Normal rate.  An irregular rhythm present.  Murmur heard. Respiratory: Effort normal. No tachypnea. No respiratory distress. He has decreased breath sounds in the right lower field and the left lower field. He has wheezes. He has rhonchi. He exhibits no tenderness.  Audible rhonchi.   GI: Soft. Bowel sounds are normal. He exhibits no distension. There is no tenderness. There is no rebound and no guarding.  Musculoskeletal: He exhibits no edema or tenderness.  Lymphadenopathy:    He has no cervical adenopathy.  Neurological: He is alert.  Oriented to self and place as "Health" only.  Unable to recall month or recent holiday but able to recall city and year. Makes eye contact and able to follow simple one and two step commands. Has flat affect with monotonous speech pattern.  Moves all 4's. Slow to initiate movement of all limbs. Strength  3+ deltoid, bicep, tricep, wrist, HI.  LE: 2/5 HF, 3- ke,  3+ adf/apf in the le's. Limited insight and awareness. Decreased processing, memory but showing improvement.  No focal CN abnl noted.   Skin: Skin is warm and dry. He is not diaphoretic.  Flaky skin BLE  Psychiatric: His affect is blunt. His speech is delayed. He is slowed. Cognition and memory are impaired.    Results for orders placed or performed during the hospital encounter of 11/27/14 (from the past 48 hour(s))  CBC     Status: Abnormal   Collection Time: 12/03/14  5:27 AM  Result Value Ref Range   WBC 14.9 (H) 4.0 - 10.5 K/uL   RBC 3.34 (L) 4.22 - 5.81 MIL/uL   Hemoglobin 10.7 (L) 13.0 - 17.0 g/dL   HCT 32.4 (L) 39.0 - 52.0 %   MCV 97.0 78.0 - 100.0 fL   MCH 32.0 26.0 - 34.0  pg   MCHC 33.0 30.0 - 36.0 g/dL   RDW 13.4 11.5 - 15.5 %   Platelets 390 150 - 400 K/uL  Basic metabolic panel     Status: Abnormal   Collection Time: 12/03/14  5:27 AM  Result Value Ref Range   Sodium 142 135 - 145 mmol/L   Potassium 3.2 (L) 3.5 - 5.1 mmol/L   Chloride 103 101 - 111 mmol/L   CO2 27 22 - 32 mmol/L   Glucose, Bld 147 (H) 65 - 99 mg/dL   BUN 23 (H) 6 - 20 mg/dL   Creatinine, Ser 0.91 0.61 - 1.24 mg/dL   Calcium 8.5 (L) 8.9 - 10.3 mg/dL   GFR calc non Af Amer >60 >60 mL/min   GFR calc Af Amer >60 >60 mL/min    Comment: (NOTE) The eGFR has been calculated using the CKD EPI equation. This calculation has not been validated in all clinical situations. eGFR's persistently <60 mL/min signify possible Chronic Kidney Disease.    Anion gap 12 5 - 15   Dg Chest Port 1 View  12/03/2014   CLINICAL DATA:  Tachypnea.  History of pneumonia  EXAM: PORTABLE CHEST - 1 VIEW  COMPARISON:  Chest x-ray from yesterday  FINDINGS: Stable pattern of asymmetric bilateral airspace disease. No effusion or diffuse septal thickening.  Stable cardiomegaly and aortic contours.  IMPRESSION: Stable bilateral airspace disease, again favored to reflect pneumonia.   Electronically Signed   By: Monte Fantasia M.D.   On: 12/03/2014 00:42   Dg Chest Port 1 View  12/02/2014   CLINICAL DATA:  Increasing shortness of breath. Hospital admission for fever.  EXAM: PORTABLE CHEST - 1 VIEW  COMPARISON:  11/29/2014.  11/25/2014.  FINDINGS: Low volume chest. This accentuates the size of the cardiopericardial silhouette which is upper limits of normal for projection.  Monitoring leads project over the chest.  LEFT perihilar airspace disease is present. This may represent asymmetric pulmonary edema or pneumonia.  Increased density is present over the RIGHT scapula, favored to be due to bony overlap rather than airspace disease.  IMPRESSION: New LEFT mid lung airspace disease around the LEFT hilum suspicious for pneumonia.  Asymmetric/ atypical pulmonary edema less likely. Followup PA and lateral chest X-ray is recommended in 3-4 weeks following trial of antibiotic therapy to ensure resolution and exclude underlying malignancy.   Electronically Signed   By: Dereck Ligas M.D.   On: 12/02/2014 11:28       Medical Problem List and Plan: 1. 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  DVT Prophylaxis/Anticoagulation: Pharmaceutical: Lovenox 3. Pain Management: Tylenol prn of arthritic pain 4. Mood: LCSW to follow for evaluation and support.  5. Neuropsych: This patient is not capable of making decisions on his own behalf. 6. Skin/Wound Care: routine pressure relief measures. Maintain adequate nutrition and hydration status.  7. Fluids/Electrolytes/Nutrition:  Monitor I/O. Monitor renal status with diuretics on board.  8. RMSF:  Two weeks of doxycyline recommended for treatment--D # 7/14.  9.  Thrombocytopenia: Slowly resolving. Check platelets in am. 10. Leucocytosis:  Monitor for now.  No additional antibiotics needed.  11. Acute renal insufficiency: Improving. Monitor with serial checks.   12. Acute on chronic systolic/diastolic CHF:  Check daily weights.  Toprol increased to 50 mg bid and lasix changed to po. Continue ACE1.  13. HTN: Monitor every 8 hours and continue to titrate medications for tighter control.  14. Prediabetes: Hgb A1C- 6.2. Educate family on [place].  15. Hypokalemia: Due to diuresis. Will add daily supplement.  16. PAF/MAT: Family to pick up event monitor today.    Post Admission Physician Evaluation: 1. Functional deficits secondary  to Right frontal lobe infarct, Encephalopathy related to RMSF 2. Patient is admitted to receive collaborative, interdisciplinary care between the physiatrist, rehab nursing staff, and therapy team. 3. Patient's level of medical complexity and substantial therapy needs in context of that medical  necessity cannot be provided at a lesser intensity of care such as a SNF. 4. Patient has experienced substantial functional loss from his/her baseline which was documented above under the "Functional History" and "Functional Status" headings.  Judging by the patient's diagnosis, physical exam, and functional history, the patient has potential for functional progress which will result in measurable gains while on inpatient rehab.  These gains will be of substantial and practical use upon discharge  in facilitating mobility and self-care at the household level. 5. Physiatrist will provide 24 hour management of medical needs as well as oversight of the therapy plan/treatment and provide guidance as appropriate regarding the interaction of the two. 6. 24 hour rehab nursing will assist with bladder management, bowel management, safety, skin/wound care, disease management, medication administration and patient education  and help integrate therapy concepts, techniques,education, etc. 7. PT will assess and treat for/with: Lower extremity strength, range of motion, stamina, balance, functional mobility, safety, adaptive techniques and equipment, NMR, cognitive perceptual awareness, family education, stamina.   Goals are: supervision. 8. OT will assess and treat for/with: ADL's, functional mobility, safety, upper extremity strength, adaptive techniques and equipment, NMR, cognitive perceptual awareness, family ed, community reintegration.   Goals are: supervision to min assist. Therapy may proceed with showering this patient. 9. SLP will assess and treat for/with: cognition, communication.  Goals are: supervision to min assist. 10. Case Management and Social Worker will assess and treat for psychological issues and discharge planning. 11. Team conference will be held weekly to assess progress toward goals and to determine barriers to discharge. 12. Patient will receive at least 3 hours of therapy per day at least 5  days per week. 13. ELOS: 15-20 days       14. Prognosis:  excellent     Meredith Staggers, MD, [facility] 12/03/2014   12/03/2014

## 2014-12-04 NOTE — Evaluation (Signed)
Physical Therapy Assessment and Plan  Patient Details  Name: Jeffrey [name] MRN: [id number] Date of Birth: December 16, 1932  PT Diagnosis: Abnormal posture, Abnormality of gait, Cognitive deficits, Difficulty walking and Muscle weakness Rehab Potential: Good ELOS:  7-10 days   Today's Date: 12/04/2014 PT Individual Time: 1100-1200 PT Individual Time Calculation (min): 60 min    Problem List:  Patient Active Problem List   Diagnosis Date Noted  . Stroke with cerebral ischemia 12/03/2014  . Pre-diabetes   . Acute on chronic combined systolic and diastolic CHF (congestive heart failure)   . Acute CVA (cerebrovascular accident)   . Left carotid artery stenosis 11/30/2014  . RMSF [facility] spotted fever)   . Pyrexia   . Lacunar infarct, acute   . AKI (acute kidney injury)   . Severe sepsis   . Respiratory distress   . Fever 11/27/2014  . Sepsis 11/27/2014  . Acute encephalopathy 11/27/2014  . Thrombocytopenia 11/27/2014  . Blood poisoning   . Aortic atherosclerosis 11/25/2014  . 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 Awareness alteration, transient 12/26/2013  . Low serum testosterone level 12/25/2013  . TIA (transient ischemic attack) 10/10/2013  . Radiation proctitis 11/28/2012  . Colon cancer screening 11/28/2012  . Murmur 02/23/2011  . Type 2 diabetes mellitus without complication 01/60/1093  . Essential hypertension, benign 11/26/2010  . Impaired fasting glucose 11/26/2010  . Pure hypercholesterolemia 11/26/2010  . Prostate cancer 11/26/2010    Past Medical History:  Past Medical History  Diagnosis Date  . Hypertension   . Hypercholesteremia   . Impaired fasting glucose   . Prostate cancer radiation + seeding implant (Dr.[name])  . Rotator cuff tear, right 10/2010    supraspinatous and infraspinatous  . Diabetes mellitus   . Macular degeneration   . Heart murmur     mild aortic stenosis  . Radiation proctitis 11/2012     treated with APC ablation and Canasa suppositories (Dr. Hilarie Fredrickson)  . TIA (transient ischemic attack) 10/02/13    [facility]osp [facility] x 1 night  . 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 Aortic atherosclerosis 11/25/2014  . Acute encephalopathy 11/27/2014  . RMSF [facility] spotted fever) 10/2014    (hosp with FUO, encephalopathy)  . Multiple lacunar infarcts   . Acute CVA (cerebrovascular accident)   . Acute on chronic combined systolic and diastolic CHF (congestive heart failure)    Past Surgical History:  Past Surgical History  Procedure Laterality Date  . Tonsillectomy and adenoidectomy  age 45  . Prostate seed implant    . Rotator cuff repair  06/16/2011    right (Dr. Berenice Primas)  . Colonoscopy N/A 11/28/2012    Procedure: COLONOSCOPY;  Surgeon: Jerene Bears, MD;  Location: [facility];  Service: Gastroenterology;  Laterality: N/A;    Assessment & Plan Clinical Impression: Jeffrey [name] is a 81 y.o. RH-male with history of HTN, DM type 2, macular degeneration who was admitted on 11/27/14 with confusion,difficulty walking, fever and mild right sided weakness with concerns of encephalitis. MRI brain done revealing He was found to be anemic with thrombocytopenia, leukocytosis with work up indicating [facility] Fever. MRI brain done revealing acute nonhemorrhagic white matter infarct in the anterior right frontal lobe and LP with glucose 70 and protein111. He developed septic shock with respiratory failure requiring intubation and was treated with broad spectrum antibiotics due to presumed PNA. He was extubated to [place] on 07/01 and swallow evaluation  done with initiation of regular diet. EEG revealed moderate diffuse slowing without epileptiform discharges. Carotid dopplers with L- 80-89% ICA stenosis. 2D echo with EF 40-45% with diffuse hypokinesis and moderate calcification AV. Neurology recommends low dose ASA for secondary stroke prevention and to  follow up with [facility] past discharge for asymptomatic L-ICA stenosis. Cardiology consulted 07/03 due to elevated cardiac enzymes and bigeminy with question of PAF. Dr. Caryl Comes evaluated patient and felt that patient with MAT and no definite A fib. He recommended ASA and 30 day monitor if no A fib detected in house. Patient developed SOB with tachypnea on 07/03 pm and CXR done revealing LML airspace disease concerning for PNA. No additional antibiotics needed per ID and patient treated with IV diuresis for acute on chronic combined systolic/diastolic CHF. Patient transferred to CIR on 12/03/2014.   Patient currently requires min with mobility secondary to muscle weakness, decreased cardiorespiratoy endurance and decreased attention and decreased memory.  Prior to hospitalization, patient was independent  with mobility and lived with Spouse in a House home.  Home access is 3Stairs to enter.  Patient will benefit from skilled PT intervention to maximize safe functional mobility, minimize fall risk and decrease caregiver burden for planned discharge home with intermittent assist.  Anticipate patient will benefit from follow up OP at discharge.  PT - End of Session Activity Tolerance: Tolerates 10 - 20 min activity with multiple rests Endurance Deficit: Yes Endurance Deficit Description: SOB with functional mobility, requires seated rest, O2 sats dropping to 80's then quickly returning to 95-96% on RA PT Assessment Rehab Potential (ACUTE/IP ONLY): Good PT Patient demonstrates impairments in the following area(s): Balance;Behavior;Endurance;Motor;Safety;Skin Integrity PT Transfers Functional Problem(s): Bed Mobility;Bed to Chair;Car;Furniture;Floor PT Locomotion Functional Problem(s): Ambulation;Wheelchair Mobility;Stairs PT Recommendation Follow Up Recommendations: Outpatient PT Patient destination: Home Equipment Recommended: To be determined  Skilled Therapeutic Intervention Skilled therapeutic  intervention initiated after completion of evaluation. Discussed with patient, wife, and granddaughter falls risk, safety within room, and focus of therapy during stay. Discussed possible LOS, goals, and f/u therapy. Patient currently min A overall without device. FTSS with BUE support = 28 sec. Patient benefited from seated rest breaks throughout session due to SOB and decreased activity tolerance. Vitals monitored throughout session with Sp02 dropping into 80's following activity then steadily rising back to 95-96% on RA. BP and HR WFL. Patient left sitting on commode, instructed to call for assistance before getting up with patient verbalizing understanding, wife and granddaughter in room.    Five times Sit to Stand Test (FTSS) Method: Use a straight back chair with a solid seat that is 16-18" high. Ask participant to sit on the chair with arms folded across their chest.   Instructions: "Stand up and sit down as quickly as possible 5 times, keeping your arms folded across your chest."   Measurement: Stop timing when the participant stands the 5th time.  TIME: ___28 sec with UE support___ (in seconds)  Times > 13.6 seconds is associated with increased disability and morbidity (Guralnik, 2000) Times > 15 seconds is predictive of recurrent falls in healthy individuals aged 84 and older (Buatois, et al., 2008) Normal performance values in community dwelling individuals aged 64 and older (Bohannon, 2006): o 60-69 years: 11.4 seconds o 70-79 years: 12.6 seconds o 80-89 years: 14.8 seconds  MCID: ? 2.3 seconds for Vestibular Disorders Mariah Milling, 2006)  PT Evaluation Precautions/Restrictions Precautions Precautions: Fall Restrictions Weight Bearing Restrictions: No General Chart Reviewed: Yes Family/Caregiver Present: Yes (wife and granddaughter) Vital Signs Pain  Pain Assessment Pain Assessment: No/denies pain Home Living/Prior Functioning Home Living Type of Home: House Home Access:  Stairs to enter Entrance Stairs-Number of Steps: 3 Entrance Stairs-Rails: Right Home Layout: One level  Lives With: Spouse Prior Function Level of Independence: Independent with basic ADLs;Independent with gait;Independent with transfers  Able to Take Stairs?: Yes Driving: Yes Leisure: Hobbies-yes (Comment) Comments: Fishing, woodworking, gardening Cognition Overall Cognitive Status: Impaired/Different from baseline Arousal/Alertness: Awake/alert Orientation Level: Oriented X4 Attention: Selective Selective Attention: Impaired Selective Attention Impairment: Verbal basic;Functional basic Memory: Impaired Memory Impairment: Decreased recall of new information Awareness: Impaired Awareness Impairment: Emergent impairment Problem Solving: Impaired Problem Solving Impairment: Functional basic;Functional complex Safety/Judgment: Impaired Sensation Sensation Light Touch: Appears Intact Hot/Cold: Appears Intact Proprioception: Appears Intact Coordination Gross Motor Movements are Fluid and Coordinated: Yes Fine Motor Movements are Fluid and Coordinated: Yes (tremor PTA) Finger Nose Finger Test: intention tremor with finger to nose Motor  Motor Motor: Abnormal postural alignment and control Motor - Skilled Clinical Observations: mild posterior lean in standing  Mobility Bed Mobility Bed Mobility: Sit to Supine;Supine to Sit Supine to Sit: HOB flat Sit to Supine: HOB flat Transfers Transfers: Yes Sit to Stand: With upper extremity assist;From chair/3-in-1;From bed;4: Min guard Stand to Sit: 4: Min guard;With upper extremity assist;To bed;To chair/3-in-1 Locomotion  Ambulation Ambulation: Yes Ambulation/Gait Assistance: 4: Min assist Ambulation Distance (Feet): 80 Feet Assistive device: None Gait Gait: Yes Gait Pattern: Impaired Gait Pattern: Step-through pattern;Scissoring;Decreased trunk rotation;Narrow base of support Gait velocity: 10 MWT = 0.31 m/s Stairs /  Additional Locomotion Stairs: Yes Stairs Assistance: 4: Min assist Stairs Assistance Details: Verbal cues for precautions/safety Stair Management Technique: Two rails;Forwards;Alternating pattern;Step to pattern (self elected step to or alternating pattern) Number of Stairs: 8 Height of Stairs: 6 Ramp: Not tested (comment) Curb: Not tested (comment) Wheelchair Mobility Wheelchair Mobility: Yes Wheelchair Assistance: 4: Min Lexicographer: Both upper extremities;Both lower extermities (BLE then BUE) Wheelchair Parts Management: Needs assistance Distance: 100 ft  Trunk/Postural Assessment  Cervical Assessment Cervical Assessment: Exceptions to [facility] (forward head) Thoracic Assessment Thoracic Assessment: Within Functional Limits Lumbar Assessment Lumbar Assessment: Within Functional Limits Postural Control Postural Control: Deficits on evaluation Protective Responses: impaired/delayed  Balance Balance Balance Assessed: Yes Static Standing Balance Static Standing - Balance Support: No upper extremity supported;During functional activity Static Standing - Level of Assistance: 4: Min assist Dynamic Standing Balance Dynamic Standing - Balance Support: During functional activity;No upper extremity supported Dynamic Standing - Level of Assistance: 4: Min assist Extremity Assessment  RUE Assessment RUE Assessment: Within Functional Limits (torn rotator cuff PTA; decreased shoulder flexion strength) LUE Assessment LUE Assessment: Within Functional Limits RLE Assessment RLE Assessment: Within Functional Limits (grossly 5/5 throughout except 4/5 hip flexion and knee flexion) LLE Assessment LLE Assessment: Within Functional Limits (grossly 5/5 throughout except 4/5 hip flexion and knee flexion)  FIM:  FIM - Bed/Chair Transfer Bed/Chair Transfer: 5: Supine > Sit: Supervision (verbal cues/safety issues);5: Sit > Supine: Supervision (verbal cues/safety issues);4: Chair or  W/C > Bed: Min A (steadying Pt. > 75%);4: Bed > Chair or W/C: Min A (steadying Pt. > 75%) FIM - Locomotion: Wheelchair Distance: 100 ft Locomotion: Wheelchair: 2: Travels 50 - 149 ft with minimal assistance (Pt.>75%) FIM - Locomotion: Ambulation Locomotion: Ambulation Assistive Devices: Other (comment) (no AD) Ambulation/Gait Assistance: 4: Min assist Locomotion: Ambulation: 2: Travels 50 - 149 ft with minimal assistance (Pt.>75%) FIM - Locomotion: Stairs Locomotion: Scientist, physiological: Hand rail - 2 Locomotion: Stairs: 2: Up and Down  4 - 11 stairs with minimal assistance (Pt.>75%)   Refer to Care Plan for Long Term Goals  Recommendations for other services: Neuropsych  Discharge Criteria: Patient will be discharged from PT if patient refuses treatment 3 consecutive times without medical reason, if treatment goals not met, if there is a change in medical status, if patient makes no progress towards goals or if patient is discharged from hospital.  The above assessment, treatment plan, treatment alternatives and goals were discussed and mutually agreed upon: by patient and by family  Laretta Alstrom 12/04/2014, 12:52 PM

## 2014-12-04 NOTE — Interval H&P Note (Signed)
Jeffrey [name] was admitted yesterday to Inpatient Rehabilitation with the diagnosis of stroke,encephalopathy after RMSF.  The patient's history has been reviewed, patient examined, and there is no change in status.  Patient continues to be appropriate for intensive inpatient rehabilitation.  I have reviewed the patient's chart and labs.  Questions were answered to the patient's satisfaction. The PAPE has been reviewed and assessment remains appropriate. This encounter and document were completed on 12/03/14. The H&P is now being placed in the rehab encounter for the purpose of charting.   Jeffrey [name] T 12/04/2014, 9:01 AM

## 2014-12-04 NOTE — Evaluation (Signed)
Speech Language Pathology Assessment and Plan  Patient Details  Name: Jeffrey [name] MRN: [id number] Date of Birth: 1932-09-24  SLP Diagnosis: Cognitive Impairments  Rehab Potential: Excellent ELOS: 7-10 days     Today's Date: 12/04/2014 SLP Individual Time: 0800-0900 SLP Individual Time Calculation (min): 60 min   Problem List:  Patient Active Problem List   Diagnosis Date Noted  . Stroke with cerebral ischemia 12/03/2014  . Pre-diabetes   . Acute on chronic combined systolic and diastolic CHF (congestive heart failure)   . Acute CVA (cerebrovascular accident)   . Left carotid artery stenosis 11/30/2014  . RMSF [facility] spotted fever)   . Pyrexia   . Lacunar infarct, acute   . AKI (acute kidney injury)   . Severe sepsis   . Respiratory distress   . Fever 11/27/2014  . Sepsis 11/27/2014  . Acute encephalopathy 11/27/2014  . Thrombocytopenia 11/27/2014  . Blood poisoning   . Aortic atherosclerosis 11/25/2014  . 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 Awareness alteration, transient 12/26/2013  . Low serum testosterone level 12/25/2013  . TIA (transient ischemic attack) 10/10/2013  . Radiation proctitis 11/28/2012  . Colon cancer screening 11/28/2012  . Murmur 02/23/2011  . Type 2 diabetes mellitus without complication 85/88/5027  . Essential hypertension, benign 11/26/2010  . Impaired fasting glucose 11/26/2010  . Pure hypercholesterolemia 11/26/2010  . Prostate cancer 11/26/2010   Past Medical History:  Past Medical History  Diagnosis Date  . Hypertension   . Hypercholesteremia   . Impaired fasting glucose   . Prostate cancer radiation + seeding implant (Dr.[name])  . Rotator cuff tear, right 10/2010    supraspinatous and infraspinatous  . Diabetes mellitus   . Macular degeneration   . Heart murmur     mild aortic stenosis  . Radiation proctitis 11/2012    treated with APC ablation and Canasa suppositories (Dr.  Hilarie Fredrickson)  . TIA (transient ischemic attack) 10/02/13    hosp at [facility] x 1 night  . 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 Aortic atherosclerosis 11/25/2014  . Acute encephalopathy 11/27/2014  . RMSF [facility] spotted fever) 10/2014    (hosp with FUO, encephalopathy)  . Multiple lacunar infarcts   . Acute CVA (cerebrovascular accident)   . Acute on chronic combined systolic and diastolic CHF (congestive heart failure)    Past Surgical History:  Past Surgical History  Procedure Laterality Date  . Tonsillectomy and adenoidectomy  age 62  . Prostate seed implant    . Rotator cuff repair  06/16/2011    right (Dr. Berenice Primas)  . Colonoscopy N/A 11/28/2012    Procedure: COLONOSCOPY;  Surgeon: Jerene Bears, MD;  Location: [facility];  Service: Gastroenterology;  Laterality: N/A;    Assessment / Plan / Recommendation Clinical Impression Patient is an 81 y.o. RH-male with history of HTN, DM type 2, macular degeneration who was admitted on 11/27/14 with confusion, difficulty walking, fever and mild right sided weakness with concerns of encephalitis. He was found to be anemic with thrombocytopenia, leukocytosis with work up indicating [facility] Fever.   MRI brain done revealing acute nonhemorrhagic white matter infarct in the anterior right frontal lobe and LP with glucose 70 and protein111. He developed septic shock with respiratory failure requiring intubation and was treated with broad spectrum antibiotics due to presumed PNA.  He was extubated to nasal cannula on 07/01 and swallow evaluation done with initiation of regular diet. EEG revealed  moderate diffuse slowing without epileptiform discharges. Cardiology consulted 07/03 due to elevated cardiac enzymes and bigeminy with question of PAF.  Dr. Caryl Comes evaluated patient and felt that patient with MAT and no definite A fib. He recommended ASA and 30 day monitor if no A-fib detected in house.   Patient  developed SOB with tachypnea on 07/03 and CXR done revealing LML airspace disease concerning for PNA. No additional antibiotics needed per ID and patient treated with IV diuresis for acute on chronic combined systolic/diastolic CHF.   Therapy ongoing and CIR recommended by MD and rehab team and patient admitted 12/03/14. Patient presents with mild cognitive impairments impacting alternating attention, recall and problem solving which impacts his ability to complete functional and familiar tasks safely. Patient would benefit from skilled SLP intervention to maximize his cognitive function and functional independence prior to discharge.   Skilled Therapeutic Interventions          Administered a cognitive-linguistic evaluation. Please see above for details. Educated the patient and his wife in regards to his current cognitive deficits and goals of skilled SLP intervention, both verbalized understanding and agreement.   SLP Assessment  Patient will need skilled [facility] during CIR admission    Recommendations  Oral Care Recommendations: Oral care BID Recommendations for Other Services: Neuropsych consult Patient destination: Home Follow up Recommendations:  (TBD) Equipment Recommended: None recommended by SLP    SLP Frequency 3 to 5 out of 7 days   SLP Treatment/Interventions Cognitive remediation/compensation;Cueing hierarchy;Functional tasks;Patient/family education;Therapeutic Activities;Internal/external aids;Environmental controls    Pain Pain Assessment Pain Assessment: No/denies pain Prior Functioning Type of Home: House  Lives With: Spouse Available Help at Discharge: Family;Available 24 hours/day  Short Term Goals: Week 1: SLP Short Term Goal 1 (Week 1): Patient will demonstrate functional problem solving for mildly complex tasks with supervision verbal cues.  SLP Short Term Goal 2 (Week 1): Patient will recall new, daily information with Mod I with use of  external memory aids.  SLP Short Term Goal 3 (Week 1): Patient will alternate attention between two tasks for 30 minutes with supervision verbal cues for redirection.  SLP Short Term Goal 4 (Week 1): Patient will self-monitor and correct errors with functional tasks with supervision question cues.   See FIM for current functional status Refer to Care Plan for Long Term Goals  Recommendations for other services: Neuropsych  Discharge Criteria: Patient will be discharged from SLP if patient refuses treatment 3 consecutive times without medical reason, if treatment goals not met, if there is a change in medical status, if patient makes no progress towards goals or if patient is discharged from hospital.  The above assessment, treatment plan, treatment alternatives and goals were discussed and mutually agreed upon: by patient and by family  Kezia Benevides 12/04/2014, 4:21 PM

## 2014-12-05 ENCOUNTER — Inpatient Hospital Stay (HOSPITAL_COMMUNITY): Payer: Medicare Other | Admitting: Speech Pathology

## 2014-12-05 ENCOUNTER — Inpatient Hospital Stay (HOSPITAL_COMMUNITY): Payer: Medicare Other

## 2014-12-05 ENCOUNTER — Inpatient Hospital Stay (HOSPITAL_COMMUNITY): Payer: Medicare Other | Admitting: Physical Therapy

## 2014-12-05 LAB — BASIC METABOLIC PANEL
Anion gap: 11 (ref 5–15)
BUN: 31 mg/dL — AB (ref 6–20)
CHLORIDE: 104 mmol/L (ref 101–111)
CO2: 30 mmol/L (ref 22–32)
CREATININE: 0.88 mg/dL (ref 0.61–1.24)
Calcium: 8.8 mg/dL — ABNORMAL LOW (ref 8.9–10.3)
GFR calc Af Amer: >60 mL/min (ref >60)
GFR calc non Af Amer: >60 mL/min (ref >60)
GLUCOSE: 132 mg/dL — AB (ref 65–99)
POTASSIUM: 3.9 mmol/L (ref 3.5–5.1)
Sodium: 145 mmol/L (ref 135–145)

## 2014-12-05 MED ORDER — PRAVASTATIN SODIUM 40 MG PO TABS
40.0000 mg | ORAL_TABLET | Freq: Every day | ORAL | Status: DC
Start: 2014-12-10 — End: 2014-12-05

## 2014-12-05 MED ORDER — OCUVITE-LUTEIN PO CAPS
1.0000 | ORAL_CAPSULE | Freq: Two times a day (BID) | ORAL | Status: DC
  Administered 2014-12-05 – 2014-12-10 (×10): 1 via ORAL
  Filled 2014-12-05 (×12): qty 1

## 2014-12-05 MED ORDER — MEGESTROL ACETATE 400 MG/10ML PO SUSP
400.0000 mg | Freq: Every day | ORAL | Status: DC
  Administered 2014-12-05 – 2014-12-10 (×6): 400 mg via ORAL
  Filled 2014-12-05 (×8): qty 10

## 2014-12-05 MED ORDER — POTASSIUM CHLORIDE CRYS ER 20 MEQ PO TBCR
20.0000 meq | EXTENDED_RELEASE_TABLET | Freq: Two times a day (BID) | ORAL | Status: DC
  Administered 2014-12-05 – 2014-12-10 (×11): 20 meq via ORAL
  Filled 2014-12-05 (×12): qty 1

## 2014-12-05 MED ORDER — DOXYCYCLINE HYCLATE 100 MG PO TABS
100.0000 mg | ORAL_TABLET | Freq: Two times a day (BID) | ORAL | Status: DC
Start: 2014-12-05 — End: 2014-12-10
  Administered 2014-12-05 – 2014-12-09 (×10): 100 mg via ORAL
  Filled 2014-12-05 (×14): qty 1

## 2014-12-05 MED ORDER — PRAVASTATIN SODIUM 40 MG PO TABS: 40.0000 mg | ORAL_TABLET | Freq: Every day | ORAL | Status: DC

## 2014-12-05 MED ORDER — FUROSEMIDE 20 MG PO TABS
20.0000 mg | ORAL_TABLET | Freq: Every day | ORAL | Status: DC
  Administered 2014-12-07 – 2014-12-10 (×4): 20 mg via ORAL
  Filled 2014-12-05 (×5): qty 1

## 2014-12-05 NOTE — Progress Notes (Signed)
Speech Language Pathology Daily Session Note  Patient Details  Name: Jeffrey [name] MRN: [id number] Date of Birth: 1932/09/15  Today's Date: 12/05/2014 SLP Individual Time: 0800-0900 SLP Individual Time Calculation (min): 60 min  Short Term Goals: Week 1: SLP Short Term Goal 1 (Week 1): Patient will demonstrate functional problem solving for mildly complex tasks with supervision verbal cues.  SLP Short Term Goal 2 (Week 1): Patient will recall new, daily information with Mod I with use of external memory aids.  SLP Short Term Goal 3 (Week 1): Patient will alternate attention between two tasks for 30 minutes with supervision verbal cues for redirection.  SLP Short Term Goal 4 (Week 1): Patient will self-monitor and correct errors with functional tasks with supervision question cues.   Skilled Therapeutic Interventions: Skilled treatment session focused on cognitive goals. Upon arrival, patient was sitting upright in wheelchair with a pair of pants pulled up to his knees, suspect patient would have attempted to stand by himself to donn pants if clinician had not arrived, however, patient reported he was going to call for assistance. SLP facilitated session by providing Min A question cues for recall of his current medications and their functions. Patient also required extra time and Min A verbal and question cues for functional problem solving, mental flexibility and emergent awareness during a mildly complex, new learning task of organizing a 4 time per day pill box. Patient left upright in wheelchair with family present. Continue with current plan of care.   FIM:  Comprehension Comprehension Mode: Auditory Comprehension: 5-Follows basic conversation/direction: With no assist Expression Expression Mode: Verbal Expression: 5-Expresses basic needs/ideas: With extra time/assistive device Social Interaction Social Interaction: 5-Interacts appropriately 90% of the time - Needs monitoring or  encouragement for participation or interaction. Problem Solving Problem Solving: 5-Solves basic 90% of the time/requires cueing < 10% of the time Memory Memory: 5-Recognizes or recalls 90% of the time/requires cueing < 10% of the time  Pain Pain Assessment Pain Assessment: No/denies pain  Therapy/Group: Individual Therapy  Jeffrey [name] 12/05/2014, 2:14 PM

## 2014-12-05 NOTE — Progress Notes (Signed)
[place] PHYSICAL MEDICINE & REHABILITATION     PROGRESS NOTE    Subjective/Complaints: Slept well again for the most part. Ate little yesterday despite "increased appetite"  ROS: Pt denies fever, rash/itching, headache, blurred or double vision, nausea, vomiting, abdominal pain, diarrhea, chest pain, shortness of breath, palpitations, dysuria, dizziness, neck or back pain, bleeding, anxiety, or depression   Objective: Vital Signs: Blood pressure 130/64, pulse 88, temperature 97.8 F (36.6 C), temperature source Oral, resp. rate 17, height 6' (1.829 m), weight 71.714 kg (158 lb 1.6 oz), SpO2 99 %. No results found.  Recent Labs  12/03/14 0527 12/04/14 0545  WBC 14.9* 9.9  HGB 10.7* 11.1*  HCT 32.4* 33.9*  PLT 390 466*    Recent Labs  12/04/14 0545 12/05/14 0526  NA 144 145  K 2.8* 3.9  CL 103 104  GLUCOSE 137* 132*  BUN 28* 31*  CREATININE 0.89 0.88  CALCIUM 8.9 8.8*   CBG (last 3)   Recent Labs  12/04/14 1210  GLUCAP 143*    Wt Readings from Last 3 Encounters:  12/05/14 71.714 kg (158 lb 1.6 oz)  11/28/14 78.9 kg (173 lb 15.1 oz)  11/25/14 78.291 kg (172 lb 9.6 oz)    Physical Exam:  Constitutional: He appears well-developed and well-nourished. No distress.  Thin  male.  HENT:  Head: Normocephalic and atraumatic.  Right Ear: External ear normal.  Left Ear: External ear normal.  Eyes: Conjunctivae are normal. Pupils are equal, round, and reactive to light. Right eye exhibits no discharge. Left eye exhibits no discharge. No scleral icterus.  Neck: Normal range of motion. Neck supple. No JVD present. No tracheal deviation present. No thyromegaly present.  Cardiovascular: Normal rate. An irregular rhythm present. Murmur heard. Respiratory: Effort normal. No tachypnea. No respiratory distress. He has decreased breath sounds in the right lower field and the left lower field. He has wheezes. He has rhonchi. He exhibits no tenderness.  Decreased  rhonchi  GI: Soft. Bowel sounds are normal. He exhibits no distension. There is no tenderness. There is no rebound and no guarding.  Musculoskeletal: He exhibits no edema or tenderness.  Lymphadenopathy:   He has no cervical adenopathy.  Neurological: He is alert.  Oriented to self and hospital. initiateing more conversation.   Moves all 4's. Slow to initiate movement of all limbs. Strength 3+ deltoid, bicep, tricep, wrist, HI. LE: 2/5 HF, 3- ke, 3+ adf/apf in the le's. Limited insight and awareness.delays still in processing, memory but showing improvement. No focal CN abnl noted.  Skin: Skin is warm and dry. He is not diaphoretic.  Flaky skin BLE  Psychiatric: His affect is more dynamic. He's initiating more.  Assessment/Plan: 1. 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 Physiatrist is providing close team supervision and 24 hour management of active medical problems listed below. Physiatrist and rehab team continue to assess barriers to discharge/monitor patient progress toward functional and medical goals. FIM: FIM - Bathing Bathing Steps Patient Completed: Chest, Right Arm, Left Arm, Abdomen, Right upper leg, Left upper leg, Right lower leg (including foot), Left lower leg (including foot) Bathing: 4: Min-Patient completes 8-9 19f 10 parts or 75+ percent  FIM - Upper Body Dressing/Undressing Upper body dressing/undressing steps patient completed: Thread/unthread left sleeve of pullover shirt/dress, Put head through opening of pull over shirt/dress, Pull shirt over trunk, Thread/unthread right sleeve of front  closure shirt/dress Upper body dressing/undressing: 5: Set-up assist to: Obtain clothing/put away FIM - Lower Body Dressing/Undressing Lower body dressing/undressing steps patient completed: Don/Doff left sock, Don/Doff  right sock, Don/Doff left shoe, Don/Doff right shoe, Thread/unthread right pants leg, Thread/unthread left pants leg, Pull pants up/down Lower body dressing/undressing: 4: Steadying Assist  FIM - Toileting Toileting steps completed by patient: Adjust clothing prior to toileting, Performs perineal hygiene, Adjust clothing after toileting Toileting Assistive Devices: Grab bar or rail for support Toileting: 4: Steadying assist  FIM - Radio producer Devices: Grab bars Toilet Transfers: 4-To toilet/BSC: Min A (steadying Pt. > 75%), 4-From toilet/BSC: Min A (steadying Pt. > 75%)  FIM - Bed/Chair Transfer Bed/Chair Transfer: 5: Supine > Sit: Supervision (verbal cues/safety issues), 5: Sit > Supine: Supervision (verbal cues/safety issues), 4: Chair or W/C > Bed: Min A (steadying Pt. > 75%), 4: Bed > Chair or W/C: Min A (steadying Pt. > 75%)  FIM - Locomotion: Wheelchair Distance: 100 ft Locomotion: Wheelchair: 2: Travels 50 - 149 ft with minimal assistance (Pt.>75%) FIM - Locomotion: Ambulation Locomotion: Ambulation Assistive Devices: Other (comment) (no AD) Ambulation/Gait Assistance: 4: Min assist Locomotion: Ambulation: 2: Travels 50 - 149 ft with minimal assistance (Pt.>75%)  Comprehension Comprehension Mode: Auditory Comprehension: 5-Follows basic conversation/direction: With no assist  Expression Expression Mode: Verbal Expression: 5-Expresses basic needs/ideas: With extra time/assistive device  Social Interaction Social Interaction: 5-Interacts appropriately 90% of the time - Needs monitoring or encouragement for participation or interaction.  Problem Solving Problem Solving: 5-Solves complex 90% of the time/cues < 10% of the time  Memory Memory: 5-Recognizes or recalls 90% of the time/requires cueing < 10% of the time  Medical Problem List and Plan: 1. Functional deficits secondary to Right frontal lobe infarct and encephalopathy related to  RMSF 2. DVT Prophylaxis/Anticoagulation: Pharmaceutical: Lovenox still indicated due to VTE risk 3. Pain Management: Tylenol prn of arthritic pain---effective at present 4. Mood: LCSW to follow for evaluation and support. in good spirits at present 5. Neuropsych: This patient is still not capable of making decisions on his own behalf. 6. Skin/Wound Care: routine pressure relief measures. Maintain adequate nutrition and hydration status.  7. Fluids/Electrolytes/Nutrition: need to push fluids. Appetite/drive to eat is improving   -potassium level improved---I personally reviewed the patient's labs today.   -continue daily supplement at reduced dose  -recheck labs tomorrow 8. RMSF: Two weeks of doxycyline recommended for treatment--D # 9/14. Thru 7.12 9. Thrombocytopenia: platelets 137k 10. Leucocytosis: wbc's down to 9.9 11. Acute renal insufficiency: Improving. .  12. Acute on chronic systolic/diastolic CHF: Checking daily weights. Toprol increased to 50 mg bid and lasix changed to po. Continue ACE1 also  -BUN slowly increasing. Will decrease lasix slightly for time being 13. HTN: Monitor every 8 hours and continue to titrate medications for tighter control.  14. Prediabetes: Hgb A1C- 6.2.family ed on CM/HH diet.  15. Hypokalemia: Due to diuresis. rx'ed.  16. PAF/MAT: event monitor in place    LOS (Days) 2 A FACE TO FACE EVALUATION WAS PERFORMED  Jeffrey [name] T 12/05/2014 8:35 AM

## 2014-12-05 NOTE — Progress Notes (Signed)
Occupational Therapy Session Note  Patient Details  Name: Jeffrey [name] MRN: [id number] Date of Birth: 08/06/1932  Today's Date: 12/05/2014 OT Individual Time: 1100-1200 OT Individual Time Calculation (min): 60 min    Short Term Goals: Week 1:   STG=LTG due to ELOS   Skilled Therapeutic Interventions/Progress Updates:    Pt seen for 1:1 OT session with a focus on functional ambulation, dynamic standing balance, cognitive dual task, activity tolerance, and safety awareness. Pt received seated in w/c with family present and agreeable to therapy. Pt declined ADL session preferring to participate in balance/mobility tasks. Pt ambulated apprx 100' to therapy gym at [facility] guard. Pt participated in cognitive dual task of ball kick with BLE and counting for apprx 10 min. Pt then engaged in bounce back ball toss with small weighted ball using RUE. Task graded to incorporate large ball for pt to use BUE during task. Pt demonstrated good balance with slightly delayed balance strategies during activities requiring SBA-min A. Pt then requesting to go outside and engaged in scavenger hunt in downstairs gift shop. Pt required to recall 3 items and navigation within small narrow space involving multiple turn changes. Pt required SBA-min A with more assist required during turn changes. Pt then ambulated outside on uneven surfaces and demonstrated good safety. Pt stating more difficulty with balance outside in bright sun due to macular degeneration. Pt then required to pathfind back to room and demonstrated appropriate memory and problem-solving to return to room. Pt required intermittent rest breaks throughout session due to decreased activity tolerance. Pt left seated in w/c with family present and all other needs within reach.   Therapy Documentation Precautions:  Precautions Precautions: Fall Restrictions Weight Bearing Restrictions: No General:   Vital Signs: Oxygen Therapy SpO2: 99 % O2 Device: Not  Delivered Pain:   ADL:   Exercises:   Other Treatments:    See FIM for current functional status  Therapy/Group: Individual Therapy  Dorann Ou 12/05/2014, 11:58 AM

## 2014-12-05 NOTE — Progress Notes (Addendum)
Physical Therapy Session Note  Patient Details  Name: Jeffrey [name] MRN: [id number] Date of Birth: 07-23-1932  Today's Date: 12/05/2014 PT Individual Time: 1430-1530 PT Individual Time Calculation (min): 60 min   Short Term Goals: Week 1:  PT Short Term Goal 1 (Week 1): = LTGs due to anticipated LOS  Skilled Therapeutic Interventions/Progress Updates:  Pt reports feeling well today. Pt scored 14/30 of FGA indicating an increased risk for falling. Pt ambulated around 500 ft without AD and with rest periods as needed with supervision to min assist. He demonstrated decreased scissoring gait, and improved midline posture. Stair training up and down 12  6  Inch steps using right handrail only to simulate home environment with supervision-min guard with reciprocal pattern. Transfers to and from floor using mat table for UE support were initiated today and he was able to perform with min assist and no cuing for sequence. Reviewed falls safety with pt who verbalized understanding. Performed static/dynamic standing balance exercises including tandem stance and standing with eyes closed with feet shoulder width apart for 30 second trials along with rhythmic stabilizations all directions with feet shoulder width apart. Plan to add more dynamic balance exercises in standing and improve endurance. Pt was left in recliner with call bell in reach with wife upon completion of PT session.  Therapy Documentation Precautions:  Precautions Precautions: Fall Restrictions Weight Bearing Restrictions: No Pain: Pain Assessment Pain Assessment: No/denies pain  See FIM for current functional status  Therapy/Group: Individual Therapy  Elsie Ra 12/05/2014, 2:35 PM

## 2014-12-05 NOTE — Progress Notes (Signed)
Doxycyline started 07/01 with two weeks antibiotic therapy--will set end date 4/14. LFTs on upward trend. Will hold pravastatin for now. Increase ocuvite per family request.

## 2014-12-06 ENCOUNTER — Inpatient Hospital Stay (HOSPITAL_COMMUNITY): Payer: Medicare Other

## 2014-12-06 ENCOUNTER — Inpatient Hospital Stay (HOSPITAL_COMMUNITY): Payer: Medicare Other | Admitting: Speech Pathology

## 2014-12-06 ENCOUNTER — Inpatient Hospital Stay (HOSPITAL_COMMUNITY): Payer: Medicare Other | Admitting: Occupational Therapy

## 2014-12-06 ENCOUNTER — Inpatient Hospital Stay (HOSPITAL_COMMUNITY): Payer: Medicare Other | Admitting: Physical Therapy

## 2014-12-06 LAB — COMPREHENSIVE METABOLIC PANEL
ALK PHOS: 45 U/L (ref 38–126)
ALT: 290 U/L — AB (ref 17–63)
ANION GAP: 9 (ref 5–15)
AST: 139 U/L — ABNORMAL HIGH (ref 15–41)
Albumin: 2.6 g/dL — ABNORMAL LOW (ref 3.5–5.0)
BILIRUBIN TOTAL: 0.9 mg/dL (ref 0.3–1.2)
BUN: 29 mg/dL — AB (ref 6–20)
CHLORIDE: 106 mmol/L (ref 101–111)
CO2: 26 mmol/L (ref 22–32)
Calcium: 8.2 mg/dL — ABNORMAL LOW (ref 8.9–10.3)
Creatinine, Ser: 0.86 mg/dL (ref 0.61–1.24)
GFR calc Af Amer: >60 mL/min (ref >60)
GFR calc non Af Amer: >60 mL/min (ref >60)
Glucose, Bld: 125 mg/dL — ABNORMAL HIGH (ref 65–99)
POTASSIUM: 4.2 mmol/L (ref 3.5–5.1)
SODIUM: 141 mmol/L (ref 135–145)
TOTAL PROTEIN: 5.6 g/dL — AB (ref 6.5–8.1)

## 2014-12-06 NOTE — IPOC Note (Signed)
Overall Plan of Care [facility]) Patient Details Name: Jeffrey [name] MRN: [id number] DOB: January 06, 1933  Admitting Diagnosis: r cva  Hospital Problems: Principal Problem:   Stroke with cerebral ischemia Active Problems:   Essential hypertension, benign   Type 2 diabetes mellitus without complication   Acute encephalopathy   RMSF [facility] spotted fever)     Functional Problem List: Nursing Safety, Endurance, Medication Management, Motor, Nutrition  PT Balance, Behavior, Endurance, Motor, Safety, Skin Integrity  OT Balance, Safety, Endurance, Motor, Vision  SLP Cognition  TR         Basic ADL's: OT Grooming, Bathing, Dressing, Toileting     Advanced  ADL's: OT       Transfers: PT Bed Mobility, Bed to Chair, Car, Furniture, Futures trader, Metallurgist: PT Ambulation, Emergency planning/management officer, Stairs     Additional Impairments: OT None  SLP Social Cognition   Problem Solving, Memory, Attention, Awareness  TR      Anticipated Outcomes Item Anticipated Outcome  Self Feeding    Swallowing      Basic self-care  mod I  Toileting  mod I    Bathroom Transfers mod I   Bowel/Bladder  manage B+B with Mod I assist  Transfers  mod I  Locomotion  mod I  Communication     Cognition  Mod I   Pain  n/a  Safety/Judgment  MAintain safety with supervision   Therapy Plan: PT Intensity: Minimum of 1-2 x/day ,45 to 90 minutes PT Frequency: 5 out of 7 days PT Duration Estimated Length of Stay: 7-10 days OT Intensity: Minimum of 1-2 x/day, 45 to 90 minutes OT Frequency: 5 out of 7 days OT Duration/Estimated Length of Stay: 7-10 days SLP Intensity: Minumum of 1-2 x/day, 30 to 90 minutes SLP Frequency: 3 to 5 out of 7 days SLP Duration/Estimated Length of Stay: 7-10 days        Team Interventions: Nursing Interventions Patient/Family Education, Discharge Planning, Medication Management, Disease Management/Prevention, Cognitive Remediation/Compensation   PT interventions Ambulation/gait training, Training and development officer, Cognitive remediation/compensation, Community reintegration, Discharge planning, DME/adaptive equipment instruction, Disease management/prevention, Functional mobility training, Neuromuscular re-education, Patient/family education, Psychosocial support, Stair training, Therapeutic Activities, Therapeutic Exercise, UE/LE Strength taining/ROM, UE/LE Coordination activities  OT Interventions Balance/vestibular training, Discharge planning, Self Care/advanced ADL retraining, Therapeutic Activities, UE/LE Coordination activities, Cognitive remediation/compensation, Functional mobility training, Patient/family education, Therapeutic Exercise, Visual/perceptual remediation/compensation, DME/adaptive equipment instruction, Neuromuscular re-education, UE/LE Strength taining/ROM, Psychosocial support  SLP Interventions Cognitive remediation/compensation, Cueing hierarchy, Functional tasks, Patient/family education, Therapeutic Activities, Internal/external aids, Environmental controls  TR Interventions    SW/CM Interventions Discharge Planning, Psychosocial Support, Patient/Family Education    Team Discharge Planning: Destination: PT-Home ,OT- Home , SLP-Home Projected Follow-up: PT-Outpatient PT, OT-  Outpatient OT, 24 hour supervision/assistance, SLP- (TBD) Projected Equipment Needs: PT-To be determined, OT- To be determined, SLP-None recommended by SLP Equipment Details: PT- , OT-  Patient/family involved in discharge planning: PT- Patient, Family member/caregiver,  OT-Patient, Family member/caregiver, SLP-Patient, Family member/caregiver  MD ELOS: 7-10 days Medical Rehab Prognosis:  Excellent Assessment: The patient has been admitted for CIR therapies with the diagnosis of right cva, encephalopathy. The team will be addressing functional mobility, strength, stamina, balance, safety, adaptive techniques and equipment, self-care, bowel  and bladder mgt, patient and caregiver education, NMR, nutrition, cognitive perceptual awareness, pain mgt, community reintegration. Goals have been set at mod I for mobility, self-care and cognition.    Meredith Staggers, MD, Mellody Drown  See Team Conference Notes for weekly updates to the plan of care

## 2014-12-06 NOTE — Progress Notes (Signed)
Occupational Therapy Session Note  Patient Details  Name: Jeffrey [name] MRN: [id number] Date of Birth: [birth year]  Today's Date: 12/06/2014 OT Individual Time: 1100- 1158 and 1430-1500 OT Individual Time Calculation (min): 58 min and  30 min    Short Term Goals: Week 1:  OT Short Term Goal 1 (Week 1): STG=LTG due to ELOS   Skilled Therapeutic Interventions/Progress Updates:  Session 1: Upon entering the room, pt seated in recliner chair with wife present in room. OT session with focus on functional ambulation, balance, self care retraining, and pt/family education Patients wife, [name], present in room for observation and education during session. Pt ambulated from recliner chair to dresser to obtain clothing items with steady assist when reaching into second and third drawer. Pt placed items on bed and engaged in bathing and dressing tasks with STS from chair at sink side. Pt encouraged to stand as much as possible during tasks. Pt required steady assist for balance during LB clothing management. Pt able to button and zip pants with increased time and verbal cues for technique of hand placement. Pt then ambulating to gym for path finding skills. Pt unable to locate gym from room although stating he knew the way. Pt ambulated with close supervision - steady assist secondary to increased speed and stumbles that pt was able to self correct. Pt returning to room at end of session and seated in recliner chair with all needs within reach.   Session 2:  Upon entering the room, pt seated in recliner chair with no c/o pain. Pt ambulating to bathroom with close supervision and without use of AD. Pt standing for toileting tasks with close supervision and steady assist for clothing management. Pt ambulating to day room for problem solving task with moderate difficulty pattern for pipe tree. Pt required min verbal guidance cues for tasks to select size pipe based on picture. Pt also trying to connect pipes to  joints that were clearly not the same size and once first fitting them and realizing they did not fit he continues to attempt to fit them together. Pt engaged in side stepping through maze of chairs to simulate crowded restaurant and pt also having to push chairs back under the table with steady assist as pt had 2 stumbles but able to self correct with steady assist. Pt ambulating without AD back to room with close supervision - steady assist and seated in recliner chair. Call bell and all needed items within reach upon exiting the room.    Therapy Documentation Precautions:  Precautions Precautions: Fall Restrictions Weight Bearing Restrictions: No Vital Signs: Therapy Vitals Temp: 97.7 F (36.5 C) Temp Source: Oral Pulse Rate: 76 Resp: 18 BP: (!) 108/48 mmHg Patient Position (if appropriate): Sitting Oxygen Therapy SpO2: 100 % O2 Device: Not Delivered Pain: Pain Assessment Pain Assessment: No/denies pain  See FIM for current functional status  Therapy/Group: Individual Therapy  Phineas Semen 12/06/2014, 4:09 PM

## 2014-12-06 NOTE — Progress Notes (Signed)
[facility] PHYSICAL MEDICINE & REHABILITATION     PROGRESS NOTE    Subjective/Complaints: Feels that he's getting stronger. Appetite still not where he wants it to be.   ROS: Pt denies fever, rash/itching, headache, blurred or double vision, nausea, vomiting, abdominal pain, diarrhea, chest pain, shortness of breath, palpitations, dysuria, dizziness, neck or back pain, bleeding, anxiety, or depression   Objective: Vital Signs: Blood pressure 154/80, pulse 73, temperature 97.8 F (36.6 C), temperature source Oral, resp. rate 16, height 6' (1.829 m), weight 71.9 kg (158 lb 8.2 oz), SpO2 99 %. No results found.  Recent Labs  12/04/14 0545  WBC 9.9  HGB 11.1*  HCT 33.9*  PLT 466*    Recent Labs  12/05/14 0526 12/06/14 0426  NA 145 141  K 3.9 4.2  CL 104 106  GLUCOSE 132* 125*  BUN 31* 29*  CREATININE 0.88 0.86  CALCIUM 8.8* 8.2*   CBG (last 3)   Recent Labs  12/04/14 1210  GLUCAP 143*    Wt Readings from Last 3 Encounters:  12/06/14 71.9 kg (158 lb 8.2 oz)  11/28/14 78.9 kg (173 lb 15.1 oz)  11/25/14 78.291 kg (172 lb 9.6 oz)    Physical Exam:  Constitutional: He appears well-developed and well-nourished. No distress.  Thin  male.  HENT:  Head: Normocephalic and atraumatic.  Right Ear: External ear normal.  Left Ear: External ear normal.  Eyes: Conjunctivae are normal. Pupils are equal, round, and reactive to light. Right eye exhibits no discharge. Left eye exhibits no discharge. No scleral icterus.  Neck: Normal range of motion. Neck supple. No JVD present. No tracheal deviation present. No thyromegaly present.  Cardiovascular: Normal rate. An irregular rhythm present. Murmur heard. Respiratory: Effort normal. No tachypnea. No respiratory distress. He has decreased breath sounds in the right lower field and the left lower field. He has wheezes. He has rhonchi. He exhibits no tenderness.  GI: Soft. Bowel sounds are normal. He exhibits no distension.  There is no tenderness. There is no rebound and no guarding.  Musculoskeletal: He exhibits no edema or tenderness.  Lymphadenopathy:   He has no cervical adenopathy.  Neurological: He is alert.  Moves all 4's. Slow to initiate movement of all limbs. Strength 3+ deltoid, bicep, tricep, wrist, HI. LE: 2/5 HF, 3- ke, 3+ adf/apf in the le's. Limited insight and awareness.delays still in processing, memory but showing improvement. No focal CN abnl noted.  Skin: Skin is warm and dry.   Flaky skin BLE  Psychiatric: His affect is more dynamic. He's initiating more.  Assessment/Plan: 1. 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 Physiatrist is providing close team supervision and 24 hour management of active medical problems listed below. Physiatrist and rehab team continue to assess barriers to discharge/monitor patient progress toward functional and medical goals.   FIM: FIM - Bathing Bathing Steps Patient Completed: Chest, Right Arm, Left Arm, Abdomen, Right upper leg, Left upper leg, Right lower leg (including foot), Left lower leg (including foot) Bathing: 4: Min-Patient completes 8-9 63f 10 parts or 75+ percent  FIM - Upper Body Dressing/Undressing Upper body dressing/undressing steps patient completed: Thread/unthread left sleeve of pullover shirt/dress, Put head through opening of pull over shirt/dress, Pull shirt over trunk, Thread/unthread right sleeve of front closure shirt/dress Upper body dressing/undressing: 5: Set-up assist to: Obtain clothing/put away FIM - Lower Body Dressing/Undressing Lower  body dressing/undressing steps patient completed: Don/Doff left sock, Don/Doff right sock, Don/Doff left shoe, Don/Doff right shoe, Thread/unthread right pants leg, Thread/unthread left pants leg, Pull pants up/down Lower body  dressing/undressing: 4: Steadying Assist  FIM - Toileting Toileting steps completed by patient: Adjust clothing prior to toileting, Performs perineal hygiene, Adjust clothing after toileting Toileting Assistive Devices: Grab bar or rail for support Toileting: 4: Steadying assist  FIM - Radio producer Devices: Grab bars Toilet Transfers: 4-To toilet/BSC: Min A (steadying Pt. > 75%), 4-From toilet/BSC: Min A (steadying Pt. > 75%)  FIM - Bed/Chair Transfer Bed/Chair Transfer: 4: Chair or W/C > Bed: Min A (steadying Pt. > 75%), 5: Bed > Chair or W/C: Supervision (verbal cues/safety issues)  FIM - Locomotion: Wheelchair Distance: 100 ft Locomotion: Wheelchair: 0: Activity did not occur FIM - Locomotion: Ambulation Locomotion: Ambulation Assistive Devices: Other (comment) (no AD) Ambulation/Gait Assistance: 4: Min assist, 5: Supervision Locomotion: Ambulation: 4: Travels 150 ft or more with minimal assistance (Pt.>75%)  Comprehension Comprehension Mode: Auditory Comprehension: 6-Follows complex conversation/direction: With extra time/assistive device  Expression Expression Mode: Verbal Expression: 5-Expresses basic needs/ideas: With no assist  Social Interaction Social Interaction: 6-Interacts appropriately with others with medication or extra time (anti-anxiety, antidepressant).  Problem Solving Problem Solving: 5-Solves complex 90% of the time/cues < 10% of the time  Memory Memory: 5-Recognizes or recalls 90% of the time/requires cueing < 10% of the time  Medical Problem List and Plan: 1. 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 DVT Prophylaxis/Anticoagulation: Pharmaceutical: Lovenox still indicated due to VTE risk 3. Pain Management: Tylenol prn of arthritic pain---effective at present 4. Mood: LCSW to follow for evaluation and support. in good spirits at present 5. Neuropsych: This  patient is still not capable of making decisions on his own behalf. 6. Skin/Wound Care: routine pressure relief measures. Maintain adequate nutrition and hydration status.  7. Fluids/Electrolytes/Nutrition: need to push fluids. Appetite  is improving   -potassium level improved---I personally reviewed the patient's labs today.   -continue daily supplement at reduced dose  -recheck labs on monday 8. RMSF: Two weeks of doxycyline recommended for treatment--D # 10/14. Thru 7.12 9. Thrombocytopenia: platelets 137k 10. Leucocytosis: wbc's down to 9.9 11. Acute renal insufficiency: Improving. .  12. Acute on chronic systolic/diastolic CHF: Checking daily weights. Toprol increased to 50 mg bid and lasix changed to po. Continue ACE1 also  -holding lasix due to rising BUN---better today---resume at 20mg  tomorrow and check bmet monday 13. HTN: Monitor every 8 hours and continue to titrate medications for tighter control.  14. Prediabetes: Hgb A1C- 6.2.family ed on CM/HH diet.  15. Hypokalemia: Due to diuresis. improved 16. PAF/MAT: event monitor in place   -spoke to cardiolgy today---he may remove event monitor to shower   LOS (Days) 3 A FACE TO FACE EVALUATION WAS PERFORMED  Naitik Hermann T 12/06/2014 8:39 AM

## 2014-12-06 NOTE — Progress Notes (Signed)
Speech Language Pathology Daily Session Note  Patient Details  Name: Jeffrey [name] MRN: [id number] Date of Birth: December 02, 1932  Today's Date: 12/06/2014 SLP Individual Time: 0800-0900 SLP Individual Time Calculation (min): 60 min  Short Term Goals: Week 1: SLP Short Term Goal 1 (Week 1): Patient will demonstrate functional problem solving for mildly complex tasks with supervision verbal cues.  SLP Short Term Goal 2 (Week 1): Patient will recall new, daily information with Mod I with use of external memory aids.  SLP Short Term Goal 3 (Week 1): Patient will alternate attention between two tasks for 30 minutes with supervision verbal cues for redirection.  SLP Short Term Goal 4 (Week 1): Patient will self-monitor and correct errors with functional tasks with supervision question cues.   Skilled Therapeutic Interventions: Skilled treatment session focused on cognitive goals. SLP facilitated session by initially providing Max A multimodal cues which faded to Min A by end of task for completion of organizing a 4 time per day ill box which was initiated yesterday which is indicative of poor carryover of new information.  Patient independently requested to use the bathroom and required Min A verbal and question cues for safety with task.  Patient also participated in a mildly complex, new learning card task and required supervision verbal cues for problem solving, organization and recall throughout task.  Patient left upright in recliner with all needs within reach. Continue with current plan of care.    FIM:  Comprehension Comprehension Mode: Auditory Comprehension: 6-Follows complex conversation/direction: With extra time/assistive device Expression Expression Mode: Verbal Expression: 5-Expresses basic needs/ideas: With no assist Social Interaction Social Interaction: 6-Interacts appropriately with others with medication or extra time (anti-anxiety, antidepressant). Problem Solving Problem  Solving: 5-Solves basic problems: With no assist Memory Memory: 5-Recognizes or recalls 90% of the time/requires cueing < 10% of the time  Pain Pain Assessment Pain Assessment: No/denies pain  Therapy/Group: Individual Therapy  Mehak Roskelley 12/06/2014, 3:53 PM

## 2014-12-06 NOTE — Progress Notes (Signed)
Physical Therapy Session Note  Patient Details  Name: Jeffrey [name] MRN: [id number] Date of Birth: 09-27-1932  Today's Date: 12/06/2014 PT Individual Time: 1300-1400 PT Individual Time Calculation (min): 60 min   Short Term Goals: Week 1:  PT Short Term Goal 1 (Week 1): = LTGs due to anticipated LOS  Skilled Therapeutic Interventions/Progress Updates:  PT session focused on dynamic balance and endurance. Pt received sitting in chair with heart unit plugged into wall. Pt stood up and attempted to walk not realizing need to unplug heart unit and was reminded to do so. Daughter was present for session today and provided good feedback. Pt ambulated without AD for 200 ft X 2 with supervision. Car transfers at [employer] were performed requiring supervision and minimal cuing to not grab car door and for sequencing to sit first then rotate legs in and keep head from hitting top. Progressed dynamic balance activities (requiring mid to mod A)- lateral walking, backward walking, side stepping with hip ABD push of BOSU (pt needed cuing to not compensate using hip flexion/ER, and for control), standing on foam pad normal BOS, eyes closed, narrow BOS, heel taps on BOSU, stepping to targets anterior, lateral, and posteriorly. Pt demonstrated increased endurance and needed less rest breaks. Pt appeared to not realize when SOB and needed cuing for rest periods.Plan to continue dynamic balance, endurance training, and monitor SOB. Pt returned to chair and left with family and call bell. Heart unit was plugged back in and reiterated the need for it to be unplugged before moving away from the chair.  Therapy Documentation Precautions:  Precautions Precautions: Fall Restrictions Weight Bearing Restrictions: No Pain: Denies pain  See FIM for current functional status  Therapy/Group: Individual Therapy  Elsie Ra 12/06/2014, 2:17 PM

## 2014-12-06 NOTE — Care Management Note (Signed)
[facility] Individual Statement of Services  Patient Name:  Jeffrey [name]  Date:  12/06/2014  Welcome to the [facility].  Our goal is to provide you with an individualized program based on your diagnosis and situation, designed to meet your specific needs.  With this comprehensive rehabilitation program, you will be expected to participate in at least 3 hours of rehabilitation therapies Monday-Friday, with modified therapy programming on the weekends.  Your rehabilitation program will include the following services:  Physical Therapy (PT), Occupational Therapy (OT), Speech Therapy (ST), 24 hour per day rehabilitation nursing, Therapeutic Recreaction (TR), Neuropsychology, Case Management (Social Worker), Rehabilitation Medicine, Nutrition Services and Pharmacy Services  Weekly team conferences will be held on Tuesdays to discuss your progress.  Your Social Worker will talk with you frequently to get your input and to update you on team discussions.  Team conferences with you and your family in attendance may also be held.  Expected length of stay: 7-10 days  Overall anticipated outcome: modified independent  Depending on your progress and recovery, your program may change. Your Social Worker will coordinate services and will keep you informed of any changes. Your Social Worker's name and contact numbers are listed  below.  The following services may also be recommended but are not provided by the [facility] will be made to provide these services after discharge if needed.  Arrangements include referral to agencies that provide these services.  Your insurance has been verified to be:  Medicare and [place] Your primary doctor is:  Dr. Tomi Bamberger  Pertinent information will be shared with your doctor and your insurance  company.  Social Worker:  [place], [place] or ([phone]   Information discussed with and copy given to patient by: Lennart Pall, 12/06/2014, 10:03 AM

## 2014-12-07 ENCOUNTER — Inpatient Hospital Stay (HOSPITAL_COMMUNITY): Payer: Medicare Other | Admitting: Physical Therapy

## 2014-12-07 ENCOUNTER — Inpatient Hospital Stay (HOSPITAL_COMMUNITY): Payer: Medicare Other | Admitting: Occupational Therapy

## 2014-12-07 ENCOUNTER — Inpatient Hospital Stay (HOSPITAL_COMMUNITY): Payer: Medicare Other | Admitting: Speech Pathology

## 2014-12-07 NOTE — Progress Notes (Signed)
[facility] PHYSICAL MEDICINE & REHABILITATION     PROGRESS NOTE    Subjective/Complaints: Had another good day. Appetite picking up. Feeling better.   ROS: Pt denies fever, rash/itching, headache, blurred or double vision, nausea, vomiting, abdominal pain, diarrhea, chest pain, shortness of breath, palpitations, dysuria, dizziness, neck or back pain, bleeding, anxiety, or depression   Objective: Vital Signs: Blood pressure 121/47, pulse 84, temperature 97.8 F (36.6 C), temperature source Oral, resp. rate 28, height 6' (1.829 m), weight 72.2 kg (159 lb 2.8 oz), SpO2 100 %. Dg Chest 2 View  12/06/2014   CLINICAL DATA:  Fever, pneumonia  EXAM: CHEST  2 VIEW  COMPARISON:  12/03/2014  FINDINGS: Improved aeration is identified bilaterally with minimal residual thin left lower lobe atelectasis or scarring. No persistent biapical opacity. Moderate enlargement of the cardiac silhouette is noted with minimal central vascular congestion. No pleural effusion. Cardiac leads obscure detail.  IMPRESSION: Cardiomegaly without focal acute finding.   Electronically Signed   By: Conchita Paris M.D.   On: 12/06/2014 12:38   No results for input(s): WBC, HGB, HCT, PLT in the last 72 hours.  Recent Labs  12/05/14 0526 12/06/14 0426  NA 145 141  K 3.9 4.2  CL 104 106  GLUCOSE 132* 125*  BUN 31* 29*  CREATININE 0.88 0.86  CALCIUM 8.8* 8.2*   CBG (last 3)   Recent Labs  12/04/14 1210  GLUCAP 143*    Wt Readings from Last 3 Encounters:  12/07/14 72.2 kg (159 lb 2.8 oz)  11/28/14 78.9 kg (173 lb 15.1 oz)  11/25/14 78.291 kg (172 lb 9.6 oz)    Physical Exam:  Constitutional: He appears well-developed and well-nourished. No distress.  Thin  male.  HENT:  Head: Normocephalic and atraumatic.  Right Ear: External ear normal.  Left Ear: External ear normal.  Eyes: Conjunctivae are normal. Pupils are equal, round, and reactive to light. Right eye exhibits no discharge. Left eye exhibits no  discharge. No scleral icterus.  Neck: Normal range of motion. Neck supple. No JVD present. No tracheal deviation present. No thyromegaly present.  Cardiovascular: Normal rate. An irregular rhythm present. Murmur heard. Respiratory: Effort normal. No tachypnea. No respiratory distress. He has decreased breath sounds in the right lower field and the left lower field. He has wheezes. He has rhonchi. He exhibits no tenderness.  GI: Soft. Bowel sounds are normal. He exhibits no distension. There is no tenderness. There is no rebound and no guarding.  Musculoskeletal: He exhibits no edema or tenderness.  Lymphadenopathy:   He has no cervical adenopathy.  Neurological: He is alert.  Moves all 4's. Processing and initiation better. Strength 3+ deltoid, bicep, tricep, wrist, HI. LE: 2/5 HF, 3- ke, 3+ adf/apf in the le's.  No focal CN abnl noted.  Skin: Skin is warm and dry.   Flaky skin BLE present Psychiatric: His affect is more dynamic. He's initiating more.  Assessment/Plan: 1. 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 Physiatrist is providing close team supervision and 24 hour management of active medical problems listed below. Physiatrist and rehab team continue to assess barriers to discharge/monitor patient progress toward functional and medical goals.   FIM: FIM - Bathing Bathing Steps Patient Completed: Chest, Right Arm, Left Arm, Abdomen, Right upper leg, Left upper leg, Right lower leg (including foot), Left lower leg (including foot), Front perineal area,  Buttocks Bathing: 4: Steadying assist  FIM - Upper Body Dressing/Undressing Upper body dressing/undressing steps patient completed: Thread/unthread left sleeve of pullover shirt/dress, Put head through opening of pull over shirt/dress, Pull shirt over trunk, Thread/unthread  right sleeve of front closure shirt/dress Upper body dressing/undressing: 5: Supervision: Safety issues/verbal cues FIM - Lower Body Dressing/Undressing Lower body dressing/undressing steps patient completed: Don/Doff left sock, Don/Doff right sock, Don/Doff left shoe, Don/Doff right shoe, Thread/unthread right pants leg, Thread/unthread left pants leg, Pull pants up/down, Fasten/unfasten right shoe, Fasten/unfasten left shoe, Fasten/unfasten pants, Thread/unthread right underwear leg, Pull underwear up/down, Thread/unthread left underwear leg Lower body dressing/undressing: 4: Steadying Assist  FIM - Toileting Toileting steps completed by patient: Adjust clothing prior to toileting, Performs perineal hygiene, Adjust clothing after toileting Toileting Assistive Devices: Grab bar or rail for support Toileting: 4: Steadying assist  FIM - Radio producer Devices: Grab bars Toilet Transfers: 4-To toilet/BSC: Min A (steadying Pt. > 75%), 4-From toilet/BSC: Min A (steadying Pt. > 75%)  FIM - Bed/Chair Transfer Bed/Chair Transfer: 0: Activity did not occur  FIM - Locomotion: Wheelchair Distance: 100 ft Locomotion: Wheelchair: 0: Activity did not occur FIM - Locomotion: Ambulation Locomotion: Ambulation Assistive Devices: Other (comment) Ambulation/Gait Assistance: 5: Supervision, 4: Min assist Locomotion: Ambulation: 4: Travels 150 ft or more with minimal assistance (Pt.>75%)  Comprehension Comprehension Mode: Auditory Comprehension: 6-Follows complex conversation/direction: With extra time/assistive device  Expression Expression Mode: Verbal Expression: 5-Expresses basic needs/ideas: With no assist  Social Interaction Social Interaction: 6-Interacts appropriately with others with medication or extra time (anti-anxiety, antidepressant).  Problem Solving Problem Solving: 5-Solves basic problems: With no assist  Memory Memory: 5-Recognizes or recalls 90% of  the time/requires cueing < 10% of the time  Medical Problem List and Plan: 1. 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 DVT Prophylaxis/Anticoagulation: Pharmaceutical: Lovenox still indicated due to VTE risk 3. Pain Management: Tylenol prn of arthritic pain---effective at present 4. Mood: LCSW to follow for evaluation and support. in good spirits at present 5. Neuropsych: This patient is still not capable of making decisions on his own behalf. 6. Skin/Wound Care: routine pressure relief measures. Maintain adequate nutrition and hydration status.  7. Fluids/Electrolytes/Nutrition: need to push fluids. Appetite  is improving   -potassium level improved---I personally reviewed the patient's labs today.   -continue daily supplement at reduced dose  -recheck labs on monday 8. RMSF: Two weeks of doxycyline recommended for treatment--D # 10/14. Thru 7.12 9. Thrombocytopenia: platelets 137k 10. Leucocytosis: wbc's down to 9.9---recheck monday 11. Acute renal insufficiency: Improving. .  12. Acute on chronic systolic/diastolic CHF: Checking daily weights. Toprol increased to 50 mg bid and lasix changed to po. Continue ACE1 also  -resumed lasix at lower dose 20mg  yesterday---check lytes monday 13. HTN: Monitor every 8 hours and continue to titrate medications for tighter control.  14. Prediabetes: Hgb A1C- 6.2.family ed on CM/HH diet.  15. Hypokalemia: Due to diuresis. improved 16. PAF/MAT: event monitor in place   -per cardiology: may remove event monitor to shower   LOS (Days) 4 A FACE TO FACE EVALUATION WAS PERFORMED  SWARTZ,ZACHARY T 12/07/2014 8:16 AM

## 2014-12-07 NOTE — Progress Notes (Signed)
Speech Language Pathology Daily Session Note  Patient Details  Name: Jeffrey [name] MRN: [id number] Date of Birth: 1932-07-12  Today's Date: 12/07/2014 SLP Individual Time: 1300-1330 SLP Individual Time Calculation (min): 30 min  Short Term Goals: Week 1: SLP Short Term Goal 1 (Week 1): Patient will demonstrate functional problem solving for mildly complex tasks with supervision verbal cues.  SLP Short Term Goal 2 (Week 1): Patient will recall new, daily information with Mod I with use of external memory aids.  SLP Short Term Goal 3 (Week 1): Patient will alternate attention between two tasks for 30 minutes with supervision verbal cues for redirection.  SLP Short Term Goal 4 (Week 1): Patient will self-monitor and correct errors with functional tasks with supervision question cues.   Skilled Therapeutic Interventions: Skilled treatment session focused on cognitive goals. SLP facilitated session by providing supervision question cues for alternating attention between 2 mildly complex, functional tasks for 30 minutes.  Patient also required supervision question cues for recall of events from previous therapy sessions and for recall of rules to a task learned in a previous SLP session (7/8).  Patient completed the mildly complex card task with extra time and Mod I and scheduling task with Supervision-Min A question cues. Patient initiated conversation with clinician in regards to d/c date. Patient demonstrated appropriate anticipatory awareness by reporting he "knows he won't be 100% and won't be able to do everything at first" and asked to leave by Tuesday, 7/9 because "he is getting behind at home". Treatment team made aware of request. Patient left upright in wheelchair with all needs within reach. Continue with current plan of care.    FIM:  Comprehension Comprehension Mode: Auditory Comprehension: 6-Follows complex conversation/direction: With extra time/assistive device Expression Expression  Mode: Verbal Expression: 5-Expresses basic needs/ideas: With no assist Social Interaction Social Interaction: 6-Interacts appropriately with others with medication or extra time (anti-anxiety, antidepressant). Problem Solving Problem Solving: 5-Solves complex 90% of the time/cues < 10% of the time Memory Memory: 5-Recognizes or recalls 90% of the time/requires cueing < 10% of the time FIM - Eating Eating Activity: 7: Complete independence:no helper  Pain Pain Assessment Pain Assessment: No/denies pain  Therapy/Group: Individual Therapy  Dameka Younker 12/07/2014, 4:09 PM

## 2014-12-07 NOTE — Progress Notes (Addendum)
Occupational Therapy Session Note  Patient Details  Name: [name] MRN: [id number] Date of Birth: 1932-11-14  Today's Date: 12/07/2014 OT Individual Time: 0800-0900 OT Individual Time Calculation (min): 60 min    Short Term Goals: No short term goals set  Skilled Therapeutic Interventions/Progress Updates:    Pt seen for BADL retraining with a focus on family education with wife, high level balance skills, problem solving.  Pt very energetic and eager to participate.  He needed 1 cue to lock breaks on w/c prior to standing up. He did need very close S, but no physical A with ambulating in and out of bathroom and with all standing activities. Pt bathed at sink today, per pt preference.  He needed one cue with donning shirt to orient properly to don, otherwise did not need cues for bathing and dressing.  Pt was able to engage in conversation while dressing without difficulty.  Pt then worked on various standing balance exercises of multiple sit >< stands without UE support, reaching to floor level with alternating L/R hands, lunging to both sides to reach for objects out of his BOS.  No LOB and good activity tolerance. Pt sitting in recliner at end of session with call light in reach and wife with pt.  Therapy Documentation Precautions:  Precautions Precautions: Fall Restrictions Weight Bearing Restrictions: No    Vital Signs: Oxygen Therapy SpO2: 90 % O2 Device: Not Delivered Pain: Pain Assessment Pain Assessment: No/denies pain ADL:  See FIM for current functional status  Therapy/Group: Individual Therapy  [place] 12/07/2014, 12:31 PM

## 2014-12-07 NOTE — Progress Notes (Addendum)
Physical Therapy Session Note  Patient Details  Name: Jeffrey [name] MRN: [id number] Date of Birth: [birth year]  Today's Date: 12/07/2014 PT Individual Time: 1000-1100, 1400-1445 PT Individual Time Calculation (min): 60 min , 45 min  Short Term Goals: Week 1:  PT Short Term Goal 1 (Week 1): = LTGs due to anticipated LOS  Skilled Therapeutic Interventions/Progress Updates:   Pt with rest tremor and progressively decreasing amplitude in session as activities progress, possibly warranting referral to MD for further work up. Rotational and frontal plane movement deficits apparent. Pt able to demonstrates improvement on unstable surfaces with when cued for alignment and post cervical/thoracic mobility interventions. Pt benefits from cueing for large amplitude movements throughout session. Pt would continue to benefit from skilled PT services to increase functional mobility.   Therapy Documentation Precautions:  Precautions Precautions: Fall Restrictions Weight Bearing Restrictions: No Vital Signs: Therapy Vitals Pulse Rate: 84 BP: (!) 121/47 mmHg Oxygen Therapy SpO2: 90 % O2 Device: Not Delivered Pain: Pain Assessment Pain Assessment: No/denies pain Mobility:  SBA with cues for safety Locomotion : Ambulation Ambulation/Gait Assistance: 4: Min guard  Other Treatments:  Tx 1: Pt performs large amplitude stepping, arm swings, side stepping, hip ER AROM, trunk rotation, sitting LUE upward chop 2x10. Retrogait performed. Large amplitude and increased gait velocity gait performed for gait trials of 200' Pt edcuated on rehab plan, safety in mobility, and progressing mobility. Heel raises 2x10. Pt performs standing balance on foam with EO and EC 3x2 and 10"x2. SLS 3"x2. Cervical/Thoracic mobs and myofascial release.   Tx 2: Pt educated on rehab progress. Daughter [name] educated and trained for transfers and gait on floor. Pt performs transfers x15 in session. Pt performs isometric partial sit to  stands 15"x2. Pt performs ball toss vs wall, wall push-ups, wall push-up with plyo push 2x10 each. Standing rotation including UE with ER step. Scap retraction with shoulder ext 2x10.  Power pulls 2x10.    See FIM for current functional status  Therapy/Group: Individual Therapy  Monia Pouch 12/07/2014, 10:34 AM

## 2014-12-08 ENCOUNTER — Inpatient Hospital Stay (HOSPITAL_COMMUNITY): Payer: Medicare Other

## 2014-12-08 MED ORDER — SACCHAROMYCES BOULARDII 250 MG PO CAPS
250.0000 mg | ORAL_CAPSULE | Freq: Two times a day (BID) | ORAL | Status: DC
  Administered 2014-12-08 – 2014-12-10 (×5): 250 mg via ORAL
  Filled 2014-12-08 (×7): qty 1

## 2014-12-08 NOTE — Progress Notes (Signed)
[facility] PHYSICAL MEDICINE & REHABILITATION     PROGRESS NOTE    Subjective/Complaints: In good spirits. Asked about a probiotic. Appetite robust. Had questions about dc date (see below)  ROS: Pt denies fever, rash/itching, headache, blurred or double vision, nausea, vomiting, abdominal pain, diarrhea, chest pain, shortness of breath, palpitations, dysuria, dizziness, neck or back pain, bleeding, anxiety, or depression   Objective: Vital Signs: Blood pressure 149/76, pulse 71, temperature 97.7 F (36.5 C), temperature source Oral, resp. rate 18, height 6' (1.829 m), weight 71.5 kg (157 lb 10.1 oz), SpO2 100 %. Dg Chest 2 View  12/06/2014   CLINICAL DATA:  Fever, pneumonia  EXAM: CHEST  2 VIEW  COMPARISON:  12/03/2014  FINDINGS: Improved aeration is identified bilaterally with minimal residual thin left lower lobe atelectasis or scarring. No persistent biapical opacity. Moderate enlargement of the cardiac silhouette is noted with minimal central vascular congestion. No pleural effusion. Cardiac leads obscure detail.  IMPRESSION: Cardiomegaly without focal acute finding.   Electronically Signed   By: Conchita Paris M.D.   On: 12/06/2014 12:38   No results for input(s): WBC, HGB, HCT, PLT in the last 72 hours.  Recent Labs  12/06/14 0426  NA 141  K 4.2  CL 106  GLUCOSE 125*  BUN 29*  CREATININE 0.86  CALCIUM 8.2*   CBG (last 3)  No results for input(s): GLUCAP in the last 72 hours.  Wt Readings from Last 3 Encounters:  12/08/14 71.5 kg (157 lb 10.1 oz)  11/28/14 78.9 kg (173 lb 15.1 oz)  11/25/14 78.291 kg (172 lb 9.6 oz)    Physical Exam:  Constitutional: He appears well-developed and well-nourished. No distress.  Thin  male.  HENT:  Head: Normocephalic and atraumatic.  Right Ear: External ear normal.  Left Ear: External ear normal.  Eyes: Conjunctivae are normal. Pupils are equal, round, and reactive to light. Right eye exhibits no discharge. Left eye exhibits no  discharge. No scleral icterus.  Neck: Normal range of motion. Neck supple. No JVD present. No tracheal deviation present. No thyromegaly present.  Cardiovascular: Normal rate. An irregular rhythm present. Murmur heard. Respiratory: Effort normal. No tachypnea. No respiratory distress. He has decreased breath sounds in the right lower field and the left lower field. He has wheezes. He has rhonchi. He exhibits no tenderness.  GI: Soft. Bowel sounds are normal. He exhibits no distension. There is no tenderness. There is no rebound and no guarding.  Musculoskeletal: He exhibits no edema or tenderness.  Lymphadenopathy:   He has no cervical adenopathy.  Neurological: He is alert.  Moves all 4's. Processing and initiation better. Strength 3+ deltoid, bicep, tricep, wrist, HI. LE: 2/5 HF, 3- ke, 3+ adf/apf in the le's.  No focal CN abnl noted.  Skin: Skin is warm and dry.   Flaky skin BLE present Psychiatric: His affect is more dynamic. He's initiating more.  Assessment/Plan: 1. 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 Physiatrist is providing close team supervision and 24 hour management of active medical problems listed below. Physiatrist and rehab team continue to assess barriers to discharge/monitor patient progress toward functional and medical goals.   FIM: FIM - Bathing Bathing Steps Patient Completed: Chest, Right Arm, Left Arm, Abdomen, Right upper leg, Left upper leg, Right lower leg (including foot), Left lower leg (including foot), Front perineal area, Buttocks Bathing: 5: Supervision:  Safety issues/verbal cues  FIM - Upper Body Dressing/Undressing Upper body dressing/undressing steps patient completed: Thread/unthread left sleeve of pullover shirt/dress, Put head through opening of pull over shirt/dress, Pull shirt over  trunk, Thread/unthread right sleeve of front closure shirt/dress Upper body dressing/undressing: 5: Supervision: Safety issues/verbal cues FIM - Lower Body Dressing/Undressing Lower body dressing/undressing steps patient completed: Don/Doff left sock, Don/Doff right sock, Don/Doff left shoe, Don/Doff right shoe, Thread/unthread right pants leg, Thread/unthread left pants leg, Pull pants up/down, Fasten/unfasten right shoe, Fasten/unfasten left shoe, Fasten/unfasten pants, Thread/unthread right underwear leg, Pull underwear up/down, Thread/unthread left underwear leg Lower body dressing/undressing: 5: Supervision: Safety issues/verbal cues  FIM - Toileting Toileting steps completed by patient: Adjust clothing prior to toileting, Performs perineal hygiene, Adjust clothing after toileting Toileting Assistive Devices: Grab bar or rail for support Toileting: 5: Supervision: Safety issues/verbal cues  FIM - Radio producer Devices: Product manager Transfers: 5-To toilet/BSC: Supervision (verbal cues/safety issues), 5-From toilet/BSC: Supervision (verbal cues/safety issues)  FIM - IT sales professional Transfer: 5: Sit > Supine: Supervision (verbal cues/safety issues), 5: Bed > Chair or W/C: Supervision (verbal cues/safety issues), 5: Chair or W/C > Bed: Supervision (verbal cues/safety issues)  FIM - Locomotion: Wheelchair Distance: 100 ft Locomotion: Wheelchair: 0: Activity did not occur FIM - Locomotion: Ambulation Locomotion: Ambulation Assistive Devices: Other (comment) (no AD) Ambulation/Gait Assistance: 4: Min guard Locomotion: Ambulation: 4: Travels 150 ft or more with minimal assistance (Pt.>75%)  Comprehension Comprehension Mode: Auditory Comprehension: 6-Follows complex conversation/direction: With extra time/assistive device  Expression Expression Mode: Verbal Expression: 5-Expresses basic needs/ideas: With no assist  Social  Interaction Social Interaction: 6-Interacts appropriately with others with medication or extra time (anti-anxiety, antidepressant).  Problem Solving Problem Solving: 5-Solves complex 90% of the time/cues < 10% of the time  Memory Memory: 5-Recognizes or recalls 90% of the time/requires cueing < 10% of the time  Medical Problem List and Plan: 1. 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 DVT Prophylaxis/Anticoagulation: Pharmaceutical: Lovenox indicated due to VTE risk 3. Pain Management: Tylenol prn of arthritic pain---effective at present 4. Mood: LCSW to follow for evaluation and support. in good spirits at present 5. Neuropsych: This patient is still not capable of making decisions on his own behalf. 6. Skin/Wound Care: routine pressure relief measures. Maintain adequate nutrition and hydration status.  7. Fluids/Electrolytes/Nutrition: need to push fluids. Appetite  is improving   -potassium level improved---I personally reviewed the patient's labs today.   -continue daily supplement at reduced dose  -recheck labs on monday 8. RMSF: Two weeks of doxycyline recommended for treatment--Thru 7.12 9. Thrombocytopenia: platelets 137k 10. Leucocytosis: wbc's down to 9.9---recheck monday 11. Acute renal insufficiency: Improving. .  12. Acute on chronic systolic/diastolic CHF: Checking daily weights. Toprol increased to 50 mg bid and lasix changed to po. Continue ACE1 also  -resumed lasix at lower dose 20mg  yesterday---check lytes tomorrow 13. HTN: Monitor every 8 hours and continue to titrate medications for tighter control.  14. Prediabetes: Hgb A1C- 6.2.family ed on CM/HH diet.  15. Hypokalemia: Due to diuresis. improved 16. PAF/MAT: event monitor in  place   -per cardiology: may remove event monitor to shower   LOS (Days) 5 A FACE TO FACE EVALUATION WAS PERFORMED  Noreen Mackintosh T 12/08/2014 8:21  AM    

## 2014-12-08 NOTE — Progress Notes (Signed)
Physical Therapy Session Note  Patient Details  Name: Jeffrey [name] MRN: [id number] Date of Birth: 1933/03/08  Today's Date: 12/08/2014 PT Individual Time:  -      Short Term Goals: Week 1:  PT Short Term Goal 1 (Week 1): = LTGs due to anticipated LOS  Skilled Therapeutic Interventions/Progress Updates:   Pt seated in armchair, dressing himself and about to stand up to pull up pants when PT entered room.  Discussed safety concerns with RN and wife.  Gait to/from room with close supervision, without LOB. Gait up/down 12 steps in gym, 2 rails, with supervision, step through method.  Therapeutic activities: for endurance, NuStep at level 4 x 8 minutes. Dual task activities via multiple colors, out- of -sequence arrangement, and multiple taps per trial,  during floor clock target tapping, and naming small animal figures while moving them, and gait while manipulating beach ball.  neuromuscular re-education via demo, forced use, VCs for :  -trunk shortening/lengthening/rotating during reaching out of BOS in unsupported sitting in all directions - trunk shortening/lengthening/rotating during toe tapping onto floor clock in unsupported standing -static balancing in anding on compliant Air mat, while arranging floor discs in descending order, by color on table in front of him -muscular endurance bil quads, gluts in static squat positiion while placing small African animal figures L>R on bench in front, while naming them  Pt able to remember 3 step floor tap, but had difficulty consistently when numbers arranged out of order in clock arrangement, and balancing SLS on RLE, tapping with LLE. He made multiple errors while arranging 2 sets of 12 floor discs, with poor visual attention to L demonstrated once. He required 1 cue for each error to correct. Pt unable to name 2 animals.  Pt ambulated back to room tossing beach ball up to himself repeatedly, slowly with difficulty moving forward at the same  time.    At end of session, PT reinforced with pt and wife that he needs supervision and to call for help before going to BR or getting OOB. All needs left within reach, and wife in room. Therapy Documentation Precautions:  Precautions Precautions: Fall Restrictions Weight Bearing Restrictions: No  Pain:none reported      See FIM for current functional status  Therapy/Group: Individual Therapy  Babbie Dondlinger 12/08/2014, 7:40 AM

## 2014-12-09 ENCOUNTER — Inpatient Hospital Stay (HOSPITAL_COMMUNITY): Payer: Medicare Other | Admitting: Physical Therapy

## 2014-12-09 ENCOUNTER — Inpatient Hospital Stay (HOSPITAL_COMMUNITY): Payer: Medicare Other

## 2014-12-09 ENCOUNTER — Inpatient Hospital Stay (HOSPITAL_COMMUNITY): Payer: Medicare Other | Admitting: Occupational Therapy

## 2014-12-09 ENCOUNTER — Inpatient Hospital Stay (HOSPITAL_COMMUNITY): Payer: Medicare Other | Admitting: Speech Pathology

## 2014-12-09 LAB — CBC
HCT: 35 % — ABNORMAL LOW (ref 39.0–52.0)
Hemoglobin: 11.2 g/dL — ABNORMAL LOW (ref 13.0–17.0)
MCH: 31.3 pg (ref 26.0–34.0)
MCHC: 32 g/dL (ref 30.0–36.0)
MCV: 97.8 fL (ref 78.0–100.0)
Platelets: 536 10*3/uL — ABNORMAL HIGH (ref 150–400)
RBC: 3.58 MIL/uL — ABNORMAL LOW (ref 4.22–5.81)
RDW: 13.2 % (ref 11.5–15.5)
WBC: 7.1 10*3/uL (ref 4.0–10.5)

## 2014-12-09 LAB — BASIC METABOLIC PANEL
Anion gap: 6 (ref 5–15)
BUN: 15 mg/dL (ref 6–20)
CHLORIDE: 105 mmol/L (ref 101–111)
CO2: 28 mmol/L (ref 22–32)
Calcium: 8.9 mg/dL (ref 8.9–10.3)
Creatinine, Ser: 0.85 mg/dL (ref 0.61–1.24)
GFR calc non Af Amer: >60 mL/min (ref >60)
GLUCOSE: 108 mg/dL — AB (ref 65–99)
Potassium: 4.7 mmol/L (ref 3.5–5.1)
SODIUM: 139 mmol/L (ref 135–145)

## 2014-12-09 NOTE — Progress Notes (Signed)
Occupational Therapy Session Note  Patient Details  Name: Jeffrey [name] MRN: [id number] Date of Birth: 1933/04/30  Today's Date: 12/09/2014 OT Individual Time: 0900-1000 OT Individual Time Calculation (min): 60 min    Short Term Goals: Week 1:  OT Short Term Goal 1 (Week 1): STG=LTG due to ELOS   Skilled Therapeutic Interventions/Progress Updates:    Pt seen for 1:1 OT session with a focus on ADL retraining, functional ambulation, dynamic standing balance, cognitive dual task, activity tolerance, and safety awareness. Pt received seated in recliner with wife present agreeable to session. Pt engaged in ADL session of bathing & dressing. Pt completed activities at mod I level demonstrating good safety throughout tasks. Pt then completed functional mobility to therapy gym for apprx 100' with supervision. Pt engaged in bounce back ball toss with larger ball to engage BUE during task, with cognitive aspect of counting. Pt demonstrated improved balance and balance strategies during activity and completed at supervision level. Pt demonstrated mod difficulty with counting backwards by 2&3, making multiple errors but able to correct with cues. Pt then engaged in functional ambulation apprx 250' while balancing tennis ball on racket. Pt demonstrated good balance strategies and completed activity with mod I. Pt engaged in NuStep at level 4 for 8 minutes, with pt requiring 2 rest breaks likely due to decreased endurance. Pt ambulated back to room apprx 100' and transferred to recliner. Therapist adjusted safety plan and made pt mod I in room. Discussed this change with pt and his wife. Pt left seated in recliner with wife present and all other needs within reach.    Therapy Documentation Precautions:  Precautions Precautions: Fall Restrictions Weight Bearing Restrictions: No General:   Vital Signs:  Pain:   ADL:   Exercises:   Other Treatments:    See FIM for current functional  status  Therapy/Group: Individual Therapy  Dorann Ou 12/09/2014, 11:13 AM

## 2014-12-09 NOTE — Plan of Care (Signed)
Problem: RH Problem Solving Goal: LTG Patient will demonstrate problem solving for (SLP) LTG: Patient will demonstrate problem solving for basic/complex daily situations with cues (SLP)  Outcome: Adequate for Discharge Pt at baseline for cognition, will have supervision at discharge    Problem: RH Awareness Goal: LTG: Patient will demonstrate intellectual/emergent (SLP) LTG: Patient will demonstrate intellectual/emergent/anticipatory awareness with assist during a cognitive/linguistic activity (SLP)  Outcome: Adequate for Discharge Pt at baseline for cognition, will have supervision at discharge

## 2014-12-09 NOTE — Plan of Care (Signed)
Problem: RH SAFETY Goal: RH STG ADHERE TO SAFETY PRECAUTIONS W/ASSISTANCE/DEVICE STG Adhere to Safety Precautions With min  Assistance/Device.   Outcome: Not Progressing Continues to get OOB without assistance.   

## 2014-12-09 NOTE — Progress Notes (Signed)
Occupational Therapy Discharge Summary  Patient Details  Name: Jeffrey [name] MRN: [id number] Date of Birth: 1933/03/07  Today's Date: 12/09/2014 OT Individual Time:    Patient has met 8 of 8 long term goals due to improved activity tolerance, improved balance, postural control, improved attention and improved awareness.  Patient to discharge at overall Modified Independent level.  Patient's care partner is independent to provide the necessary physical and cognitive assistance at discharge.    Reasons goals not met: N/A. All LTG met.   Recommendation:  No skilled occupational therapy recommended at this time.   Equipment: No equipment provided  Reasons for discharge: treatment goals met and discharge from hospital  Patient/family agrees with progress made and goals achieved: Yes  OT Discharge Precautions/Restrictions  Restrictions Weight Bearing Restrictions: No General   Vital Signs  Pain Pain Assessment Pain Assessment: No/denies pain ADL   Vision/Perception  Vision- History Baseline Vision/History: Wears glasses Wears Glasses: At all times Patient Visual Report: No change from baseline Vision- Assessment Vision Assessment?: Yes Eye Alignment: Within Functional Limits Ocular Range of Motion: Within Functional Limits Alignment/Gaze Preference: Within Defined Limits Tracking/Visual Pursuits: Able to track stimulus in all quads without difficulty Saccades: Within functional limits Convergence: Impaired (comment) Visual Fields: No apparent deficits  Cognition Overall Cognitive Status: Impaired/Different from baseline Arousal/Alertness: Awake/alert Orientation Level: Oriented X4 Attention: Selective Selective Attention: Impaired Selective Attention Impairment: Verbal basic;Functional basic Memory: Impaired Memory Impairment: Decreased recall of new information Awareness: Impaired Awareness Impairment: Emergent impairment Problem Solving: Impaired Problem  Solving Impairment: Functional complex Behaviors: Impulsive Safety/Judgment: Impaired Comments: impulsive  Sensation Sensation Light Touch: Appears Intact Hot/Cold: Appears Intact Proprioception: Appears Intact Coordination Gross Motor Movements are Fluid and Coordinated: Yes Fine Motor Movements are Fluid and Coordinated: Yes (tremor PTA ) Motor  Motor Motor: Within Functional Limits Motor - Discharge Observations: WFL  Mobility  Bed Mobility Bed Mobility: Sit to Supine Supine to Sit: HOB flat Sit to Supine: HOB flat Transfers Transfers: Sit to Stand;Stand to Sit Sit to Stand: 6: Modified independent (Device/Increase time) Stand to Sit: 6: Modified independent (Device/Increase time)  Trunk/Postural Assessment  Cervical Assessment Cervical Assessment: Exceptions to [facility] (forward head flexion ) Thoracic Assessment Thoracic Assessment: Within Functional Limits Lumbar Assessment Lumbar Assessment: Within Functional Limits Postural Control Protective Responses: WFL for age   Balance Balance Balance Assessed: Yes Static Standing Balance Static Standing - Balance Support: No upper extremity supported;During functional activity Static Standing - Level of Assistance: 6: Modified independent (Device/Increase time) Dynamic Standing Balance Dynamic Standing - Balance Support: During functional activity;No upper extremity supported Dynamic Standing - Level of Assistance: 6: Modified independent (Device/Increase time) Extremity/Trunk Assessment RUE Assessment RUE Assessment: Within Functional Limits (R shoulder torn rotator cuff; decreased R shoulder flexion ) LUE Assessment LUE Assessment: Within Functional Limits  See FIM for current functional status  Dorann Ou 12/09/2014, 1:42 PM

## 2014-12-09 NOTE — Progress Notes (Signed)
[place] PHYSICAL MEDICINE & REHABILITATION     PROGRESS NOTE    Subjective/Complaints: Becoming a little irritable because he wants to go home to tend to mother's affairs  ROS: Pt denies fever, rash/itching, headache, blurred or double vision, nausea, vomiting, abdominal pain, diarrhea, chest pain, shortness of breath, palpitations, dysuria, dizziness, neck or back pain, bleeding, anxiety, or depression   Objective: Vital Signs: Blood pressure 153/95, pulse 82, temperature 98 F (36.7 C), temperature source Oral, resp. rate 18, height 6' (1.829 m), weight 71.85 kg (158 lb 6.4 oz), SpO2 100 %. No results found.  Recent Labs  12/09/14 0535  WBC 7.1  HGB 11.2*  HCT 35.0*  PLT 536*    Recent Labs  12/09/14 0535  NA 139  K 4.7  CL 105  GLUCOSE 108*  BUN 15  CREATININE 0.85  CALCIUM 8.9   CBG (last 3)  No results for input(s): GLUCAP in the last 72 hours.  Wt Readings from Last 3 Encounters:  12/09/14 71.85 kg (158 lb 6.4 oz)  11/28/14 78.9 kg (173 lb 15.1 oz)  11/25/14 78.291 kg (172 lb 9.6 oz)    Physical Exam:  Constitutional: He appears well-developed and well-nourished. No distress.  Thin  male.  HENT:  Head: Normocephalic and atraumatic.  Right Ear: External ear normal.  Left Ear: External ear normal.  Eyes: Conjunctivae are normal. Pupils are equal, round, and reactive to light. Right eye exhibits no discharge. Left eye exhibits no discharge. No scleral icterus.  Neck: Normal range of motion. Neck supple. No JVD present. No tracheal deviation present. No thyromegaly present.  Cardiovascular: Normal rate. An irregular rhythm present. Murmur heard. Respiratory: Effort normal. No tachypnea. No respiratory distress. He has decreased breath sounds in the right lower field and the left lower field. He has wheezes. He has rhonchi. He exhibits no tenderness.  GI: Soft. Bowel sounds are normal. He exhibits no distension. There is no tenderness. There is no  rebound and no guarding.  Musculoskeletal: He exhibits no edema or tenderness.  Lymphadenopathy:   He has no cervical adenopathy.  Neurological: He is alert.  Moves all 4's. Processing and initiation better. Strength 3+ deltoid, bicep, tricep, wrist, HI. LE: 2/5 HF, 3- ke, 3+ adf/apf in the le's.  No focal CN abnl noted.  Skin: Skin is warm and dry.   Flaky skin BLE present Psychiatric: His affect is more dynamic. He's initiating more.  Assessment/Plan: 1. 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 Physiatrist is providing close team supervision and 24 hour management of active medical problems listed below. Physiatrist and rehab team continue to assess barriers to discharge/monitor patient progress toward functional and medical goals.   FIM: FIM - Bathing Bathing Steps Patient Completed: Chest, Right Arm, Left Arm, Abdomen, Right upper leg, Left upper leg, Right lower leg (including foot), Left lower leg (including foot), Front perineal area, Buttocks Bathing: 5: Supervision: Safety issues/verbal cues  FIM - Upper Body Dressing/Undressing Upper body dressing/undressing steps patient completed: Thread/unthread left sleeve of pullover shirt/dress, Put head through opening of pull over shirt/dress, Pull shirt over trunk, Thread/unthread right sleeve of front closure shirt/dress Upper body dressing/undressing: 5: Supervision: Safety issues/verbal cues FIM - Lower Body Dressing/Undressing Lower body dressing/undressing steps patient completed: Don/Doff left sock, Don/Doff right sock, Don/Doff left shoe, Don/Doff right shoe, Thread/unthread right pants leg, Thread/unthread left pants leg, Pull  pants up/down, Fasten/unfasten right shoe, Fasten/unfasten left shoe, Fasten/unfasten pants, Thread/unthread right underwear leg, Pull underwear  up/down, Thread/unthread left underwear leg Lower body dressing/undressing: 5: Supervision: Safety issues/verbal cues  FIM - Toileting Toileting steps completed by patient: Adjust clothing prior to toileting, Adjust clothing after toileting, Performs perineal hygiene Toileting Assistive Devices: Grab bar or rail for support Toileting: 5: Supervision: Safety issues/verbal cues  FIM - Radio producer Devices: Product manager Transfers: 5-To toilet/BSC: Supervision (verbal cues/safety issues), 5-From toilet/BSC: Supervision (verbal cues/safety issues)  FIM - IT sales professional Transfer: 5: Sit > Supine: Supervision (verbal cues/safety issues), 5: Bed > Chair or W/C: Supervision (verbal cues/safety issues), 5: Chair or W/C > Bed: Supervision (verbal cues/safety issues)  FIM - Locomotion: Wheelchair Distance: 100 ft Locomotion: Wheelchair: 0: Activity did not occur FIM - Locomotion: Ambulation Locomotion: Ambulation Assistive Devices: Other (comment) (none) Ambulation/Gait Assistance: 5: Supervision Locomotion: Ambulation: 5: Travels 150 ft or more with supervision/safety issues  Comprehension Comprehension Mode: Auditory Comprehension: 6-Follows complex conversation/direction: With extra time/assistive device  Expression Expression Mode: Verbal Expression: 5-Expresses basic needs/ideas: With no assist  Social Interaction Social Interaction: 6-Interacts appropriately with others with medication or extra time (anti-anxiety, antidepressant).  Problem Solving Problem Solving: 5-Solves complex 90% of the time/cues < 10% of the time  Memory Memory: 5-Recognizes or recalls 90% of the time/requires cueing < 10% of the time  Medical Problem List and Plan: 1. 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 DVT Prophylaxis/Anticoagulation: Pharmaceutical: Lovenox indicated due to VTE risk 3. Pain Management: Tylenol prn of arthritic pain---effective at present 4. Mood: LCSW to follow for evaluation and support. in good spirits at present 5. Neuropsych: This patient is still not capable of making decisions on his own behalf. 6. Skin/Wound Care: routine pressure relief measures. Maintain adequate nutrition and hydration status.  7. Fluids/Electrolytes/Nutrition: need to push fluids. Appetite  is improving   -potassium level improved--    -continue daily supplement at reduced dose  -I personally reviewed the patient's labs today---wnl 8. RMSF: Two weeks of doxycyline recommended for treatment--Thru 7.12 9. Thrombocytopenia: platelets 137k 10. Leucocytosis: wbc's down to 7.7.  11. Acute renal insufficiency: Improving. .  12. Acute on chronic systolic/diastolic CHF: Checking daily weights. Toprol increased to 50 mg bid and lasix changed to po. Continue ACE1 also  -resumed lasix at lower dose 20mg  Saturday  -weights decreased 13. HTN: Monitor every 8 hours and continue to titrate medications for tighter control.  14. Prediabetes: Hgb A1C- 6.2.family ed on CM/HH diet.  15. Hypokalemia: Due to diuresis. improved 16. PAF/MAT: event monitor in place   -per cardiology: may remove event monitor to shower   LOS (Days) 6 A FACE TO FACE EVALUATION WAS PERFORMED  Mitcheal Sweetin T 12/09/2014 8:43 AM

## 2014-12-09 NOTE — Progress Notes (Signed)
Social Work Patient ID: Jeffrey [name], male   DOB: December 24, 1932, 81 y.o.   MRN: [id number]   Notified by tx team that pt has met all supervision goals and they feel he is ready for d/c tomorrow.  Have alerted Dr. Naaman Plummer who is in agreement.  Pt and family also aware and agreeable.  To arrange OPPT for follow up.  No DME needs.  Aleaya Latona, LCSW

## 2014-12-09 NOTE — Progress Notes (Signed)
Occupational Therapy Session Note  Patient Details  Name: Jeffrey [name] MRN: [id number] Date of Birth: [birth year]  Today's Date: 12/09/2014 OT Individual Time: 1130-1155 OT Individual Time Calculation (min): 25 min    Short Term Goals: Week 1:  OT Short Term Goal 1 (Week 1): STG=LTG due to ELOS   Skilled Therapeutic Interventions/Progress Updates:    Pt seen for OT session focusing on functional activity tolerance and functional balance. Pt sitting in recliner upon arrival, agreeable to tx. Pt ambulated throughout hospital at supervision- mod I level with no AE. Pt ambulated down to hospital gift shop and walked throughout crowded store without LOB episodes and demonstrating good functional endurance, tolerating ~20 minutes of walking without s/s of fatigue. Pt required mod VCs for re-direction back to unit. Upon return to unit, pt went to ADL apartment and accessed items from over head cabinet to fix himself a drink while standing and sat at low surface couch to rest, able to sit <> stand mod I. Pt voiced being excited for upcoming d/c with no concern.  Pt returned to room at end of session, left sitting in recliner with visitors present.  Pt educated regarding fall risks, energy conservation, and d/c planning.    Therapy Documentation Precautions:  Precautions Precautions: Fall Restrictions Weight Bearing Restrictions: No Pain: Pain Assessment Pain Assessment: No/denies pain  See FIM for current functional status  Therapy/Group: Individual Therapy  Lewis, Alessander Sikorski C 12/09/2014, 7:19 AM

## 2014-12-09 NOTE — Progress Notes (Signed)
Speech Language Pathology Discharge Summary  Patient Details  Name: Jeffrey [name] MRN: [id number] Date of Birth: [birth year]  Today's Date: 12/09/2014 SLP Individual Time: 1001-1100 SLP Individual Time Calculation (min): 59 min   Skilled Therapeutic Interventions:  Pt was seen for skilled ST targeting cognitive goals.  Upon arrival, pt was seated upright in recliner, awake, alert, and agreeable to participate in [place].  Pt ambulated to ST treatment room with overall supervision cues for route finding.  SLP facilitated the session with continued practice for medication management to address functional problem solving for home management tasks.  Pt utilized an external aid to recall function and frequency of medication with overall min assist-supervision verbal cues.  Pt planned and executed a problem solving strategy to load pills of varying dosages and frequencies for  78% accuracy with overall min assist verbal cues to recognize and correct errors.  SLP addressed alternating/divided attention during the abovementioned task as well with planned interruptions of functional conversations between SLP, pt, and pt's wife.  Pt alternated/divided his attention between conversations and medication management with mod I.  SLP provided skilled education recommending that pt have assistance for medication and financial management at discharge due to cognitive deficits.  Pt's wife and pt were in agreement with recommendation.  Pt and pt's wife also both report that pt is "100%" returned to his cognitive baseline.  Of note, pt demonstrated continued impulsivity and actually left his room while SLP was in the hallway speaking with his wife.  SLP reviewed and reinforced that pt is only mod I within his room and that he needs someone with him if he is going to leave his room.  Pt and pt's wife verbalized understanding.  All education is complete at this time.  No further ST needs are indicated.      Patient has met 2 of 4  long term goals.  Patient to discharge at overall Supervision;Modified Independent level.  Reasons goals not met: goals not met were adequate for discharge given that pt is at baseline for cognition and pt will have supervision at discharge.    Clinical Impression/Discharge Summary:  Pt made functional gains while inpatient and is discharging having met 2 out of 4 long term goals.  See above for reasons goals not met.  Pt has returned to his cognitive baseline and will have supervision at discharge as well as assistance for both medication and financial management from his wife.  Pt is overall mod I-supervision for basic cognitive tasks due to decreased recall of new information, decreased functional problem solving, decreased emergent/anticipatory awareness of deficits, and decreased safety awareness with impulsivity.  Given that pt is at his cognitive baseline and that he will have the recommended level of assist for self care and/or home management tasks, no further ST needs are warranted at this time.  Pt and family education is complete.    Care Partner:  Caregiver Able to Provide Assistance: Yes  Type of Caregiver Assistance: Physical;Cognitive  Recommendation:  None      Equipment: none recommended by SLP    Reasons for discharge: Discharged from hospital   Patient/Family Agrees with Progress Made and Goals Achieved: Yes   See FIM for current functional status  Emilio Math 12/09/2014, 12:17 PM

## 2014-12-09 NOTE — Progress Notes (Signed)
Physical Therapy Discharge Summary  Patient Details  Name: Jeffrey [name] MRN: [id number] Date of Birth: [birth year]  Today's Date: 12/09/2014 PT Individual Time: 7564-3329 PT Individual Time Calculation (min): 60 min    Patient has met 9 of 9 long term goals due to improved activity tolerance, improved balance, improved postural control, increased strength, increased range of motion, ability to compensate for deficits, functional use of  right lower extremity and left lower extremity and improved coordination.  Patient to discharge at an ambulatory level Modified Independent.   Patient's care partner is independent to provide the necessary physical and cognitive assistance at discharge.  Reasons goals not met: NA  Recommendation:  Patient will benefit from ongoing skilled PT services in outpatient setting to continue to advance safe functional mobility, address ongoing impairments in high level balance, strength, activity tolerance, and minimize fall risk.  Equipment: No equipment provided  Reasons for discharge: treatment goals met and discharge from hospital  Patient/family agrees with progress made and goals achieved: Yes  Skilled Therapeutic Intervention Patient at overall mod I ambulatory level without device with recommendation for supervision for uneven outdoor surfaces (grass, etc). Patient demonstrates decreased risk of falls as evidenced by improvement on FGA to 24/30 from 14/30 on evaluation, improvement on 5TSS with score of 12 sec (without BUE support) from 28 sec (with BUE support) on evaluation, and increased gait speed with improvement on 10 MWT without device to 1.1 m/s from 0.31 m/s on evaluation. See below for further discharge details. Patient instructed in standing OTAGO HEP for balance and falls prevention x 10 each exercise and provided with handout. Patient and wife with no further questions regarding discharge. Patient mod I in room.     PT  Discharge Precautions/Restrictions Precautions Precautions: Fall Restrictions Weight Bearing Restrictions: No Pain Pain Assessment Pain Assessment: No/denies pain Vision/Perception  Vision - Assessment Eye Alignment: Within Functional Limits Ocular Range of Motion: Within Functional Limits Alignment/Gaze Preference: Within Defined Limits Tracking/Visual Pursuits: Able to track stimulus in all quads without difficulty Saccades: Within functional limits Convergence: Impaired (comment)  Cognition Overall Cognitive Status: Impaired/Different from baseline Arousal/Alertness: Awake/alert Orientation Level: Oriented X4 Attention: Selective Selective Attention: Impaired Selective Attention Impairment: Verbal basic;Functional basic Memory: Impaired Memory Impairment: Decreased recall of new information Awareness: Impaired Awareness Impairment: Emergent impairment Problem Solving: Impaired Problem Solving Impairment: Functional complex Behaviors: Impulsive Safety/Judgment: Impaired Comments: mild impulsivity impacting safety  Sensation Sensation Light Touch: Appears Intact Hot/Cold: Appears Intact Proprioception: Appears Intact Coordination Gross Motor Movements are Fluid and Coordinated: Yes Fine Motor Movements are Fluid and Coordinated: Yes (tremor PTA ) Heel Shin Test: [facility] Motor  Motor Motor: Within Functional Limits Motor - Discharge Observations: WFL   Mobility Bed Mobility Bed Mobility: Sit to Supine Supine to Sit: HOB flat Sit to Supine: HOB flat Transfers Sit to Stand: 6: Modified independent (Device/Increase time) Stand to Sit: 6: Modified independent (Device/Increase time) Locomotion  Ambulation Ambulation: Yes Ambulation/Gait Assistance: 6: Modified independent (Device/Increase time) Ambulation Distance (Feet): 500 Feet Assistive device: None Gait Gait: Yes Gait Pattern: Impaired Gait Pattern: Narrow base of support;Trunk flexed;Step-through  pattern Gait velocity: 10 MWT = 1.11 m/s Stairs / Additional Locomotion Stairs: Yes Stairs Assistance: 5: Supervision Stair Management Technique: One rail Right;Alternating pattern;Forwards Number of Stairs: 22 Height of Stairs: 6 Ramp: 6: Modified independent (Device) Curb: 6: Modified independent (Device/increase time) Wheelchair Mobility Wheelchair Mobility: No (pt ambulatory)  Trunk/Postural Assessment  Cervical Assessment Cervical Assessment: Exceptions to [facility] (forward head flexion ) Thoracic Assessment  Thoracic Assessment: Within Functional Limits Lumbar Assessment Lumbar Assessment: Within Functional Limits Postural Control Protective Responses: WFL for age   Balance Balance Balance Assessed: Yes Static Standing Balance Static Standing - Balance Support: No upper extremity supported;During functional activity Static Standing - Level of Assistance: 6: Modified independent (Device/Increase time) Dynamic Standing Balance Dynamic Standing - Balance Support: During functional activity;No upper extremity supported Dynamic Standing - Level of Assistance: 6: Modified independent (Device/Increase time)  Functional Gait Assessment (FGA) Requirements: A marked 6-m (20-ft) walkway that is marked with a 30.48-cm (12-in) width.  __3 1. GAIT LEVEL SURFACE Instructions: Walk at your normal speed from here to the next mark (6 m[20 ft]). Grading: [place]ta [place] the highest category that applies. (3) Normal-Walks 6 m (20 ft) in less than 5.5 seconds, no assistive devices, good speed, no evidence for imbalance, normal gait pattern, deviates no more than 15.24 cm (6 in) outside of the 30.48-cm (12-in) walkway width. (2) Mild impairment-Walks 6 m (20 ft) in less than 7 seconds but greater than 5.5 seconds, uses assistive device, slower speed, mild gait deviations, or deviates 15.24-25.4 cm (6-10 in) outside of the 30.48-cm (12-in) walkway width. (1) Moderate impairment-Walks 6 m (20 ft), slow speed,  abnormal gait pattern, evidence for imbalance, or deviates 25.4-38.1 cm (10-15 in) outside of the 30.48-cm (12-in) walkway width. Requires more than 7 seconds to ambulate 6 m (20 ft). (0) Severe impairment-Cannot walk 6 m (20 ft) without assistance,severe gait deviations or imbalance, deviates greater than 38.1 cm (15 in) outside of the 30.48-cm (12-in) walkway width or reaches and touches the wall.  __3 2. CHANGE IN GAIT SPEED Instructions: Begin walking at your normal pace (for 1.5 m [5 ft]). When I tell you "go," walk as fast as you can (for 1.5 m [5 ft]). When I tell you "slow," walk as slowly as you can (for 1.5 m [5 ft]). Grading: [place] the highest category that applies. (3) Normal-Able to smoothly change walking speed without loss of balance or gait deviation. Shows a significant difference in walking speeds between normal, fast, and slow speeds. Deviates no more than 15.24 cm (6 in) outside of the 30.48-cm (12-in) walkway width. (2) Mild impairment-Is able to change speed but demonstrates mild gait deviations, deviates 15.24-25.4 cm (6-10 in) outside of the 30.48-cm (12-in) walkway width, or no gait deviations but unable to achieve a significant change in velocity, or uses an assistive device. (1) Moderate impairment-Makes only minor adjustments to walking speed, or accomplishes a change in speed with significant gait deviations, deviates 25.4-38.1 cm (10-15 in) outside the 30.48-cm (12-in) walkway width, or changes speed but loses balance but is able to recover and continue walking. (0) Severe impairment-Cannot change speeds, deviates greater than 38.1 cm (15 in) outside 30.48-cm (12-in) walkway width, or loses balance and has to reach for wall or be caught.  __3 3. GAIT WITH HORIZONTAL HEAD TURNS Instructions: Walk from here to the next mark 6 m (20 ft) away. Begin walking at your normal pace. Keep walking straight; after 3 steps, turn your head to the right and keep walking straight while  looking to the right. After 3 more steps, turn your head to the left and keep walking straight while looking left. Continue alternating looking right and left every 3 steps until you have completed 2 repetitions in each direction. Grading: [place] the highest category that applies. (3) Normal-Performs head turns smoothly with no change in gait. Deviates no more than 15.24 cm (6 in) outside 30.48-cm (12-in)  walkway width. (2) Mild impairment-Performs head turns smoothly with slight change in gait velocity (eg, minor disruption to smooth gait path), deviates 15.24-25.4 cm (6-10 in) outside 30.48-cm (12-in) walkway width, or uses an assistive device.  (1) Moderate impairment-Performs head turns with moderate change in gait velocity, slows down, deviates 25.4-38.1 cm (10-15 in) outside 30.48-cm (12-in) walkway width but recovers, can continue to walk. (0) Severe impairment-Performs task with severe disruption of gait (eg, staggers 38.1 cm [15 in] outside 30.48-cm (12-in) walkway width, loses balance, stops, or reaches for wall).  __3 4. GAIT WITH VERTICAL HEAD TURNS Instructions: Walk from here to the next mark (6 m [20 ft]). Begin walking at your normal pace. Keep walking straight; after 3 steps, tip your head up and keep walking straight while looking up. After 3 more steps, tip your head down, keep walking straight while looking down. Continue alternating looking up and down every 3 steps until you have completed 2 repetitions in each direction. Grading: [place] the highest category that applies. (3) Normal-Performs head turns with no change in gait. Deviates no more than 15.24 cm (6 in) outside 30.48-cm (12-in) walkway width. (2) Mild impairment-Performs task with slight change in gait velocity (eg, minor disruption to smooth gait path), deviates 15.24-25.4 cm (6-10 in) outside 30.48-cm (12-in) walkway width or uses assistive device. (1) Moderate impairment-Performs task with moderate change in gait velocity,  slows down, deviates 25.4-38.1 cm (10-15 in) outside 30.48-cm (12-in) walkway width but recovers, can continue to walk. (0) Severe impairment-Performs task with severe disruption of gait (eg, staggers 38.1 cm [15 in] outside 30.48-cm (12-in) walkway width, loses balance, stops, reaches for wall).  3__ 5. GAIT AND PIVOT TURN Instructions: Begin with walking at your normal pace. When I tell you, "turn and stop," turn as quickly as you can to face the opposite direction and stop. Grading: [place] the highest category that applies. (3) Normal-Pivot turns safely within 3 seconds and stops quickly with no loss of balance. (2) Mild impairment-Pivot turns safely in _3 seconds and stops with no loss of balance, or pivot turns safely within 3 seconds and stops with mild imbalance, requires small steps to catch balance. (1) Moderate impairment-Turns slowly, requires verbal cueing, or requires several small steps to catch balance following turn and stop. (0) Severe impairment-Cannot turn safely, requires assistance to turn and stop.  _2_ 6. STEP OVER OBSTACLE Instructions: Begin walking at your normal speed. When you come to the shoe box, step over it, not around it, and keep walking. Grading: [place] the highest category that applies. (3) Normal-Is able to step over 2 stacked shoe boxes taped together (22.86 cm [9 in] total height) without changing gait speed; no evidence of imbalance. (2) Mild impairment-Is able to step over one shoe box (11.43 cm [4.5 in] total height) without changing gait speed; no evidence of imbalance. (1) Moderate impairment-Is able to step over one shoe box (11.43 cm [4.5 in] total height) but must slow down and adjust steps to clear box safely. May require verbal cueing. (0) Severe impairment-Cannot perform without assistance.  1__ 7. GAIT WITH NARROW BASE OF SUPPORT Instructions: Walk on the floor with arms folded across the chest, feet aligned heel to toe in tandem for a distance of  3.6 m [12 ft]. The number of steps taken in a straight line are counted for a maximum of 10 steps. Grading: [place] the highest category that applies. (3) Normal-Is able to ambulate for 10 steps heel to toe with no staggering. (  2) Mild impairment-Ambulates 7-9 steps. (1) Moderate impairment-Ambulates 4-7 steps. (0) Severe impairment-Ambulates less than 4 steps heel to toe or cannot perform without assistance.  _2_ 8. GAIT WITH EYES CLOSED Instructions: Walk at your normal speed from here to the next mark (6 m [20 ft]) with your eyes closed. Grading: [place] the highest category that applies. (3) Normal-Walks 6 m (20 ft), no assistive devices, good speed, no evidence of imbalance, normal gait pattern, deviates no more than 15.24 cm (6 in) outside 30.48-cm (12-in) walkway width. Ambulates 6 m (20 ft) in less than 7 seconds. (2) Mild impairment-Walks 6 m (20 ft), uses assistive device, slower speed, mild gait deviations, deviates 15.24-25.4 cm (6-10 in) outside 30.48-cm (12-in) walkway width. Ambulates 6 m (20 ft) in less than 9 seconds but greater than 7 seconds. (1) Moderate impairment-Walks 6 m (20 ft), slow speed, abnormal gait pattern, evidence for imbalance, deviates 25.4-38.1 cm (10-15 in) outside 30.48-cm (12-in) walkway width. Requires more than 9 seconds to ambulate 6 m (20 ft). (0) Severe impairment-Cannot walk 6 m (20 ft) without assistance, severe gait deviations or imbalance, deviates greater than 38.1 cm (15 in) outside 30.48-cm (12-in) walkway width or will not attempt task.  _2_ 9. AMBULATING BACKWARDS Instructions: Walk backwards until I tell you to stop. Grading: [place] the highest category that applies. (3) Normal-Walks 6 m (20 ft), no assistive devices, good speed, no evidence for imbalance, normal gait pattern, deviates no more than 15.24 cm (6 in) outside 30.48-cm (12-in) walkway width. (2) Mild impairment-Walks 6 m (20 ft), uses assistive device, slower speed, mild gait deviations,  deviates 15.24-25.4 cm (6-10 in) outside 30.48-cm (12-in) walkway width. (1) Moderate impairment-Walks 6 m (20 ft), slow speed, abnormal gait pattern, evidence for imbalance, deviates 25.4-38.1 cm (10-15 in) outside 30.48-cm (12-in) walkway width. (0) Severe impairment-Cannot walk 6 m (20 ft) without assistance, severe gait deviations or imbalance, deviates greater than 38.1 cm (15 in) outside 30.48-cm (12-in) walkway width or will not attempt task.  _2_ 10. STEPS Instructions: Walk up these stairs as you would at home (ie, using the rail if necessary). At the top turn around and walk down. Grading: [place] the highest category that applies. (3) Normal-Alternating feet, no rail. (2) Mild impairment-Alternating feet, must use rail. (1) Moderate impairment-Two feet to a stair; must use rail. (0) Severe impairment-Cannot do safely.  TOTAL SCORE: ___24__ /30 (MAXIMUM SCORE=30)  Scores of ? 22/30 on the FGA were found to be effective in predicting falls, Sensitivity 85%, Specificity 86% Scores of ? 20/30 on the FGA were optimal to predict older adults residing in community dwellings who would sustain unexplained falls in the next 6 months, Sensitivity 100%, Specificity 76% ([place], 2010; aged 52 to 27, Older Adults) [place]: 4.2 points for CVA Augustin Coupe et al, 2010) MCID: 8 points for Balance and Vestibular Disorders Marjorie Smolder and Augustin Coupe, 2014)  Five times Sit to Stand Test (FTSS) Method: Use a straight back chair with a solid seat that is 16-18" high. Ask participant to sit on the chair with arms folded across their chest.   Instructions: "Stand up and sit down as quickly as possible 5 times, keeping your arms folded across your chest."   Measurement: Stop timing when the participant stands the 5th time.  TIME: __12____ (in seconds)  Times > 13.6 seconds is associated with increased disability and morbidity (Guralnik, 2000) Times > 15 seconds is predictive of recurrent falls in healthy  individuals aged 50 and older (Buatois, et  al., 2008) Normal performance values in community dwelling individuals aged 37 and older ([place], 2006): o 60-69 years: 11.4 seconds o 70-79 years: 12.6 seconds o 80-89 years: 14.8 seconds  MCID: ? 2.3 seconds for Vestibular Disorders ([place], 2006)   Extremity Assessment  RUE Assessment RUE Assessment: Within Functional Limits (R shoulder torn rotator cuff; decreased R shoulder flexion ) LUE Assessment LUE Assessment: Within Functional Limits RLE Assessment RLE Assessment: Within Functional Limits (grossly 5/5 throughout except hip flexion and ankle DF 4/5) LLE Assessment LLE Assessment: Within Functional Limits (grossly 5/5 throughout except hip flexion and ankle DF 4/5)  See FIM for current functional status  Laretta Alstrom 12/09/2014, 2:20 PM

## 2014-12-10 MED ORDER — ACETAMINOPHEN ER 650 MG PO TBCR: 650.0000 mg | EXTENDED_RELEASE_TABLET | Freq: Three times a day (TID) | ORAL | Status: DC | PRN

## 2014-12-10 MED ORDER — POTASSIUM CHLORIDE CRYS ER 10 MEQ PO TBCR: 10.0000 meq | EXTENDED_RELEASE_TABLET | Freq: Two times a day (BID) | ORAL | Status: DC

## 2014-12-10 MED ORDER — LISINOPRIL 20 MG PO TABS: 20.0000 mg | ORAL_TABLET | Freq: Every day | ORAL | Status: DC

## 2014-12-10 MED ORDER — METOPROLOL SUCCINATE ER 50 MG PO TB24: 50.0000 mg | ORAL_TABLET | Freq: Two times a day (BID) | ORAL | Status: DC

## 2014-12-10 MED ORDER — ALBUTEROL SULFATE HFA 108 (90 BASE) MCG/ACT IN AERS: 2.0000 | INHALATION_SPRAY | Freq: Four times a day (QID) | RESPIRATORY_TRACT | Status: DC | PRN

## 2014-12-10 MED ORDER — FUROSEMIDE 20 MG PO TABS: 20.0000 mg | ORAL_TABLET | Freq: Every day | ORAL | Status: DC

## 2014-12-10 MED ORDER — DOXYCYCLINE HYCLATE 100 MG PO TABS
100.0000 mg | ORAL_TABLET | Freq: Two times a day (BID) | ORAL | Status: DC
Start: 2014-12-10 — End: 2014-12-10

## 2014-12-10 MED ORDER — OCUVITE-LUTEIN PO CAPS: 1.0000 | ORAL_CAPSULE | Freq: Two times a day (BID) | ORAL | Status: DC

## 2014-12-10 MED ORDER — HYDROCERIN EX CREA: 1.0000 "application " | TOPICAL_CREAM | Freq: Two times a day (BID) | CUTANEOUS | Status: DC

## 2014-12-10 MED ORDER — DOXYCYCLINE HYCLATE 100 MG PO TABS: 100.0000 mg | ORAL_TABLET | Freq: Two times a day (BID) | ORAL | Status: DC

## 2014-12-10 NOTE — Progress Notes (Signed)
Social Work  Discharge Note  The overall goal for the admission was met for:   Discharge location: Yes - home with wife to provide 24/7 supervision  Length of Stay: Yes - 7 days  Discharge activity level: Yes - supervision  Home/community participation: Yes  Services provided included: MD, RD, PT, OT, SLP, RN, TR, Pharmacy, Neuropsych and SW  Financial Services: Medicare and Private Insurance: [place]  Follow-up services arranged: Outpatient: PT via [facility] and Patient/Family has no preference for HH/DME agencies  Comments (or additional information):  Patient/Family verbalized understanding of follow-up arrangements: Yes  Individual responsible for coordination of the follow-up plan: pt/wife  Confirmed correct DME delivered: NA    Jeffrey [name]

## 2014-12-10 NOTE — Discharge Instructions (Addendum)
Inpatient Rehab Discharge Instructions  Jeffrey [name] Discharge date and time:  12/10/14  Activities/Precautions/ Functional Status: Activity: no lifting, driving, or strenuous exercise for till cleared by MD. Diet: cardiac diet with diabetic restrictions.  Wound Care: none needed   Functional status:  ___ No restrictions     ___ Walk up steps independently _X__ 24/7 supervision/assistance   ___ Walk up steps with assistance ___ Intermittent supervision/assistance  ___ Bathe/dress independently ___ Walk with walker     ___ Bathe/dress with assistance ___ Walk Independently    ___ Shower independently _X__ Walk with supervision    ___ Shower with assistance _X__ No alcohol     ___ Return to work/school ________    COMMUNITY REFERRALS UPON DISCHARGE:    Outpatient: PT                   Agency:  [facility] Phone: [phone]               Appointment Date/Time:  7/18 @ 8:45 am   Special Instructions: 1. Do not use Pravastatin due to elevated liver function test.  2. Do not use Norvasc.    STROKE/TIA DISCHARGE INSTRUCTIONS SMOKING Cigarette smoking nearly doubles your risk of having a stroke & is the single most alterable risk factor  If you smoke or have smoked in the last 12 months, you are advised to quit smoking for your health.  Most of the excess cardiovascular risk related to smoking disappears within a year of stopping.  Ask you doctor about anti-smoking medications  [place] Quit Line: 1-800-QUIT NOW  Free Smoking Cessation Classes [phone]  CHOLESTEROL Know your levels; limit fat & cholesterol in your diet  Lipid Panel     Component Value Date/Time   CHOL 104 11/30/2014 0345   TRIG 266* 11/30/2014 0345   HDL 11* 11/30/2014 0345   CHOLHDL 9.5 11/30/2014 0345   VLDL 53* 11/30/2014 0345   LDLCALC 40 11/30/2014 0345      Many patients benefit from treatment even if their cholesterol is at goal.  Goal: Total Cholesterol (CHOL) less than  160  Goal:  Triglycerides (TRIG) less than 150  Goal:  HDL greater than 40  Goal:  LDL (LDLCALC) less than 100   BLOOD PRESSURE American Stroke Association blood pressure target is less that 120/80 mm/Hg  Your discharge blood pressure is:  BP: 103/61 mmHg  Monitor your blood pressure  Limit your salt and alcohol intake  Many individuals will require more than one medication for high blood pressure  DIABETES (A1c is a blood sugar average for last 3 months) Goal HGBA1c is under 7% (HBGA1c is blood sugar average for last 3 months)  Diabetes:     Lab Results  Component Value Date   HGBA1C 6.2* 11/30/2014     Your HGBA1c can be lowered with medications, healthy diet, and exercise.  Check your blood sugar as directed by your physician  Call your physician if you experience unexplained or low blood sugars.  PHYSICAL ACTIVITY/REHABILITATION Goal is 30 minutes at least 4 days per week  Activity: No driving, Therapies:  See above Return to work:  N/A  Activity decreases your risk of heart attack and stroke and makes your heart stronger.  It helps control your weight and blood pressure; helps you relax and can improve your mood.  Participate in a regular exercise program.  Talk with your doctor about the best form of exercise for you (dancing, walking, swimming,  cycling).  DIET/WEIGHT Goal is to maintain a healthy weight  Your discharge diet is: Diet heart healthy/carb modified Room service appropriate?: Yes; Fluid consistency:: Thin liquids Your height is:  Height: 6' (182.9 cm) Your current weight is: Weight: 71.804 kg (158 lb 4.8 oz) Your Body Mass Index (BMI) is:  BMI (Calculated): 22.2  Following the type of diet specifically designed for you will help prevent another stroke.  You are at goal weight.  Your goal Body Mass Index (BMI) is 19-24.  Healthy food habits can help reduce 3 risk factors for stroke:  High cholesterol, hypertension, and excess weight.  RESOURCES  Stroke/Support Group:  Call [phone]   STROKE EDUCATION PROVIDED/REVIEWED AND GIVEN TO PATIENT Stroke warning signs and symptoms How to activate emergency medical system (call 911). Medications prescribed at discharge. Need for follow-up after discharge. Personal risk factors for stroke. Pneumonia vaccine given:  Flu vaccine given:  My questions have been answered, the writing is legible, and I understand these instructions.  I will adhere to these goals & educational materials that have been provided to me after my discharge from the hospital.      My questions have been answered and I understand these instructions. I will adhere to these goals and the provided educational materials after my discharge from the hospital.  Patient/Caregiver Signature _______________________________ Date __________  Clinician Signature _______________________________________ Date __________  Please bring this form and your medication list with you to all your follow-up doctor's appointments.

## 2014-12-10 NOTE — Progress Notes (Signed)
[facility] PHYSICAL MEDICINE & REHABILITATION     PROGRESS NOTE    Subjective/Complaints: In good spirits. Very appreciative of efforts of team. Happy he's going home.   ROS: Pt denies fever, rash/itching, headache, blurred or double vision, nausea, vomiting, abdominal pain, diarrhea, chest pain, shortness of breath, palpitations, dysuria, dizziness, neck or back pain, bleeding, anxiety, or depression   Objective: Vital Signs: Blood pressure 103/61, pulse 68, temperature 98.4 F (36.9 C), temperature source Oral, resp. rate 18, height 6' (1.829 m), weight 71.804 kg (158 lb 4.8 oz), SpO2 100 %. No results found.  Recent Labs  12/09/14 0535  WBC 7.1  HGB 11.2*  HCT 35.0*  PLT 536*    Recent Labs  12/09/14 0535  NA 139  K 4.7  CL 105  GLUCOSE 108*  BUN 15  CREATININE 0.85  CALCIUM 8.9   CBG (last 3)  No results for input(s): GLUCAP in the last 72 hours.  Wt Readings from Last 3 Encounters:  12/10/14 71.804 kg (158 lb 4.8 oz)  11/28/14 78.9 kg (173 lb 15.1 oz)  11/25/14 78.291 kg (172 lb 9.6 oz)    Physical Exam:  Constitutional: He appears well-developed and well-nourished. No distress.  Thin  male.  HENT:  Head: Normocephalic and atraumatic.  Right Ear: External ear normal.  Left Ear: External ear normal.  Eyes: Conjunctivae are normal. Pupils are equal, round, and reactive to light. Right eye exhibits no discharge. Left eye exhibits no discharge. No scleral icterus.  Neck: Normal range of motion. Neck supple. No JVD present. No tracheal deviation present. No thyromegaly present.  Cardiovascular: Normal rate. An irregular rhythm present. Murmur heard. Respiratory: Effort normal. No tachypnea. No respiratory distress. He has decreased breath sounds in the right lower field and the left lower field. He has wheezes. He has rhonchi. He exhibits no tenderness.  GI: Soft. Bowel sounds are normal. He exhibits no distension. There is no tenderness. There is no  rebound and no guarding.  Musculoskeletal: He exhibits no edema or tenderness.  Lymphadenopathy:   He has no cervical adenopathy.  Neurological: He is alert.  Moves all 4's. Processing and initiation better. Strength 3+ deltoid, bicep, tricep, wrist, HI. LE: 2/5 HF, 3- ke, 3+ adf/apf in the le's.  No focal CN abnl noted.  Skin: Skin is warm and dry.   Flaky skin BLE present Psychiatric: His affect is more dynamic. He's initiating more.  Assessment/Plan: 1. 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 Physiatrist is providing close team supervision and 24 hour management of active medical problems listed below. Physiatrist and rehab team continue to assess barriers to discharge/monitor patient progress toward functional and medical goals.   FIM: FIM - Bathing Bathing Steps Patient Completed: Chest, Right Arm, Left Arm, Abdomen, Right upper leg, Left upper leg, Right lower leg (including foot), Left lower leg (including foot), Front perineal area, Buttocks Bathing: 6: More than reasonable amount of time  FIM - Upper Body Dressing/Undressing Upper body dressing/undressing steps patient completed: Thread/unthread left sleeve of pullover shirt/dress, Put head through opening of pull over shirt/dress, Pull shirt over trunk, Thread/unthread right sleeve of front closure shirt/dress Upper body dressing/undressing: 6: More than reasonable amount of time FIM - Lower Body Dressing/Undressing Lower body dressing/undressing steps patient completed: Don/Doff left sock, Don/Doff right sock, Don/Doff left shoe, Don/Doff right shoe, Thread/unthread right pants leg, Thread/unthread left  pants leg, Pull pants up/down, Fasten/unfasten right shoe, Fasten/unfasten left shoe, Fasten/unfasten pants, Thread/unthread right underwear leg, Pull underwear up/down,  Thread/unthread left underwear leg Lower body dressing/undressing: 6: More than reasonable amount of time  FIM - Toileting Toileting steps completed by patient: Adjust clothing prior to toileting, Adjust clothing after toileting, Performs perineal hygiene Toileting Assistive Devices: Grab bar or rail for support Toileting: 6: More than reasonable amount of time  FIM - Radio producer Devices: Product manager Transfers: 6-Assistive device: No helper  FIM - IT sales professional Transfer: 6: More than reasonable amt of time  FIM - Locomotion: Wheelchair Distance: 100 ft Locomotion: Wheelchair: 0: Activity did not occur (pt ambulatory) FIM - Locomotion: Ambulation Locomotion: Ambulation Assistive Devices: Other (comment) (none) Ambulation/Gait Assistance: 6: Modified independent (Device/Increase time) Locomotion: Ambulation: 6: Travels 150 ft or more independently/takes more than reasonable amount of time  Comprehension Comprehension Mode: Auditory Comprehension: 6-Follows complex conversation/direction: With extra time/assistive device  Expression Expression Mode: Verbal Expression: 5-Expresses basic needs/ideas: With no assist  Social Interaction Social Interaction: 6-Interacts appropriately with others with medication or extra time (anti-anxiety, antidepressant).  Problem Solving Problem Solving: 5-Solves complex 90% of the time/cues < 10% of the time  Memory Memory: 5-Recognizes or recalls 90% of the time/requires cueing < 10% of the time  Medical Problem List and Plan: 1. Functional deficits secondary to Right frontal lobe infarct and encephalopathy related to RMSF  -home today, goals met. Follow up with cards, pcp, ID 2. DVT Prophylaxis/Anticoagulation: Pharmaceutical: Lovenox indicated due to VTE risk 3. Pain Management: Tylenol prn of arthritic pain---effective at present 4. Mood: LCSW to follow for evaluation and support. in  good spirits at present 5. Neuropsych: This patient is still not capable of making decisions on his own behalf. 6. Skin/Wound Care: routine pressure relief measures. Maintain adequate nutrition and hydration status.  7. Fluids/Electrolytes/Nutrition: need to push fluids. Appetite  is improving   -potassium level improved--    -continue daily supplement at reduced dose l 8. RMSF: Two weeks of doxycyline recommended for treatment--Thru 7.12 9. Thrombocytopenia: platelets 137k 10. Leucocytosis: wbc's down to 7.7.  11. Acute renal insufficiency: resolved.  12. Acute on chronic systolic/diastolic CHF: Checking daily weights. Toprol  50 mg bid. Continue ACE1 also  -resumed lasix at lower dose 61m Saturday---continue as outpt---follow up with pcp    13. HTN: Monitor every 8 hours and continue to titrate medications for tighter control.  14. Prediabetes: Hgb A1C- 6.2.family ed on CM/HH diet.  15. Hypokalemia: Due to diuresis. improved 16. PAF/MAT: event monitor in place   -per cardiology: may remove event monitor to shower   LOS (Days) 7 A FACE TO FACE EVALUATION WAS PERFORMED  Raschelle Wisenbaker T 12/10/2014 8:03 AM

## 2014-12-10 NOTE — Discharge Summary (Signed)
Physician Discharge Summary  Patient ID: Jeffrey [name] MRN: [id number] DOB/AGE: 1932/07/29 81 y.o.  Admit date: 12/03/2014 Discharge date: 12/10/2014  Discharge Diagnoses:  Principal Problem:   Stroke with cerebral ischemia Active Problems:   Essential hypertension, benign   Type 2 diabetes mellitus without complication   Acute encephalopathy   RMSF [facility] spotted fever)   Discharged Condition: Stable.  Significant Diagnostic Studies: Dg Chest 2 View  12/06/2014   CLINICAL DATA:  Fever, pneumonia  EXAM: CHEST  2 VIEW  COMPARISON:  12/03/2014  FINDINGS: Improved aeration is identified bilaterally with minimal residual thin left lower lobe atelectasis or scarring. No persistent biapical opacity. Moderate enlargement of the cardiac silhouette is noted with minimal central vascular congestion. No pleural effusion. Cardiac leads obscure detail.  IMPRESSION: Cardiomegaly without focal acute finding.   Electronically Signed   By: Conchita Paris M.D.   On: 12/06/2014 12:38    Labs:  Basic Metabolic Panel:  Recent Labs Lab 12/04/14 0545 12/05/14 0526 12/06/14 0426 12/09/14 0535  NA 144 145 141 139  K 2.8* 3.9 4.2 4.7  CL 103 104 106 105  CO2 31 30 26 28   GLUCOSE 137* 132* 125* 108*  BUN 28* 31* 29* 15  CREATININE 0.89 0.88 0.86 0.85  CALCIUM 8.9 8.8* 8.2* 8.9    CBC:  Recent Labs Lab 12/04/14 0545 12/09/14 0535  WBC 9.9 7.1  NEUTROABS 6.3  --   HGB 11.1* 11.2*  HCT 33.9* 35.0*  MCV 96.9 97.8  PLT 466* 536*    CBG:  Recent Labs Lab 12/04/14 1210  GLUCAP 143*    Brief HPI:   Jeffrey [name] is a 81 y.o. RH-male with history of HTN, DM type 2, macular degeneration who was admitted on 11/27/14 with confusion,difficulty walking, fever and mild right sided weakness. Work up positive for [employer] Fever and MRI brain done revealing acute nonhemorrhagic white matter infarct in the anterior right frontal lobe. He developed septic shock with respiratory  failure requiring intubation and was treated with broad spectrum antibiotics due to presumed PNA.  He was extubated without difficulty but has continued to have confusion. EEG revealed moderate diffuse slowing without epileptiform discharges. Carotid dopplers with L- 80-89% ICA stenosis.  Neurology recommends low dose ASA for secondary stroke prevention and to follow up with [facility] past discharge for asymptomatic L-ICA stenosis. Cardiology consulted 07/03 due to elevated cardiac enzymes and bigeminy with question of PAF therefore  30 day monitor recommended to rule out A fib. He has been treated with diuretics for SOB due to fluid overload with improvement in respiratory status.   Therapy ongoing and CIR recommended by MD and rehab team   Hospital Course: Jeffrey [name] was admitted to rehab 12/03/2014 for inpatient therapies to consist of PT, ST and OT at least three hours five days a week. Past admission physiatrist, therapy team and rehab RN have worked together to provide customized collaborative inpatient rehab. Confusion has gradually resolved with improvement in initiation and processing.  Admission labs revealed worsening of renal status with hypokalemia. Diuretics were held briefly and resumed at lower dose as renal function improved. Hypokalemia has resolved with supplementation and respiratory status has been stable. Heart rate has been controlled on Toprol 50 mg bid and no cardiac symptoms reported with increase in activity level. LFTs were elevated therefore pravastatin was placed on hold. These are resolving and he is to have follow labs repeated with input by primary MD prior to resumption  of medication.  Hemoglobin has gradually improved to 11.2 g/dl  and reactive leucocytosis has resolved. Po intake has been good and he's continent of bowel and bladder.  He has been afebrile and is to continue doxycyline thorough 07/14 to complete 2 weeks of antibiotic therapy.  He has made good progress and is  currently at supervision level. He will continue to receive follow up outpatient PT at [facility] past discharge.    Rehab course: During patient's stay in rehab weekly team conferences were held to monitor patient's progress, set goals and discuss barriers to discharge. At admission, patient required min assist with self care tasks and mobility. He had mild cognitive affecting attention, recall and problem solving which impacted his ability to complete functional and familiar tasks safely. He has had improvement in activity tolerance, balance, postural control, as well as ability to compensate for deficits. He is ambulating at modified independent level. He is able to complete ADL tasks independently. He is currently at his cognitive baseline per family and he requires occasional supervision for basic tasks due to decreased problem solving, impaired memory and decreased awareness.  Family education was done with wife who will assist as needed for cognitive tasks and provide supervision when out of home setting.     Disposition: Home   Diet: Cardiac diet with diabetic restrictions.   Special Instructions: 1. No driving. No alcohol.  2. Needs to have LFTs and BMET checked on post hospital follow up.       Medication List    STOP taking these medications        budesonide 0.25 MG/2ML nebulizer solution  Commonly known as:  PULMICORT     lovastatin 20 MG tablet  Commonly known as:  MEVACOR      TAKE these medications        acetaminophen 650 MG CR tablet  Commonly known as:  TYLENOL ARTHRITIS PAIN  Take 1 tablet (650 mg total) by mouth every 8 (eight) hours as needed.     albuterol 108 (90 BASE) MCG/ACT inhaler  Commonly known as:  PROVENTIL HFA;VENTOLIN HFA  Inhale 2 puffs into the lungs every 6 (six) hours as needed for wheezing or shortness of breath.     aspirin 81 MG tablet  Take 81 mg by mouth 2 (two) times daily.     doxycycline 100 MG tablet  Commonly known as:  VIBRA-TABS   Take 1 tablet (100 mg total) by mouth every 12 (twelve) hours. Completes treatment today     EQ SLEEP AID 25 MG tablet  Generic drug:  doxylamine (Sleep)  Take 25 mg by mouth at bedtime as needed for sleep.     furosemide 20 MG tablet  Commonly known as:  LASIX  Take 1 tablet (20 mg total) by mouth daily.     hydrocerin Crea  Apply 1 application topically 2 (two) times daily.     hydrocortisone cream 1 %  Apply 1 application topically as needed (for ankles).     ipratropium-albuterol 0.5-2.5 (3) MG/3ML Soln  Commonly known as:  DUONEB  Take 3 mLs by nebulization every 6 (six) hours as needed.     lisinopril 20 MG tablet  Commonly known as:  PRINIVIL,ZESTRIL  Take 1 tablet (20 mg total) by mouth daily.     metoprolol succinate 50 MG 24 hr tablet  Commonly known as:  TOPROL-XL  Take 1 tablet (50 mg total) by mouth 2 (two) times daily with a meal. Take with or immediately  following a meal.     potassium chloride 10 MEQ tablet  Commonly known as:  K-DUR,KLOR-CON  Take 1 tablet (10 mEq total) by mouth 2 (two) times daily.     PRESERVISION AREDS Caps  Take 1 capsule by mouth every morning.     white petrolatum Gel  Commonly known as:  VASELINE  Topical use as needed for lips care           Follow-up Information    Follow up with Meredith Staggers, MD On 02/04/2015.   Specialty:  Physical Medicine and Rehabilitation   Why:  Be there at  10:30  for 11 am  appointment    Contact information:   [address], [place] [address] [phone]       Follow up with Minus Breeding, MD. Call today.   Specialty:  Cardiology   Why:  for follow up appointment   Contact information:   [address] [place] [address] [phone]       Follow up with Antony Contras, MD. Call today.   Specialties:  Neurology, Radiology   Why:  for stroke follow up in 6 weeks.    Contact information:   [address] [place]  [address] [phone]       Follow up with Rita Ohara A, MD On 12/23/2014.   Specialty:  Family Medicine   Why:  Be there at  11:30 for post hospital follow up and check of lytes.   Contact information:   [address] [place]  [address] [phone]       Signed: Bary Leriche 12/10/2014, 8:46 AM

## 2014-12-11 LAB — CULTURE, BLOOD, SINGLE SET ONLY: ORGANISM ID, BACTERIA: NO GROWTH

## 2014-12-12 NOTE — Patient Care Conference (Signed)
Inpatient RehabilitationTeam Conference and Plan of Care Update Date: 12/10/2014   Time: 1:55 PM    Patient Name: Jeffrey [name]      Medical Record Number: [id number]  Date of Birth: 10/29/1932 Sex: Male         Room/Bed: 4W08C/4W08C-01 Payor Info: Payor: MEDICARE / Plan: MEDICARE PART A AND B / Product Type: *No Product type* /    Admitting Diagnosis: r cva  Admit Date/Time:  12/03/2014  5:31 PM Admission Comments: No comment available   Primary Diagnosis:  Stroke with cerebral ischemia Principal Problem: Stroke with cerebral ischemia  Patient Active Problem List   Diagnosis Date Noted  . Stroke with cerebral ischemia 12/03/2014  . Pre-diabetes   . Acute on chronic combined systolic and diastolic CHF (congestive heart failure)   . Acute CVA (cerebrovascular accident)   . Left carotid artery stenosis 11/30/2014  . RMSF [facility] spotted fever)   . Pyrexia   . Lacunar infarct, acute   . AKI (acute kidney injury)   . Severe sepsis   . Respiratory distress   . Fever 11/27/2014  . Sepsis 11/27/2014  . Acute encephalopathy 11/27/2014  . Thrombocytopenia 11/27/2014  . Blood poisoning   . Aortic atherosclerosis 11/25/2014  . 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 Awareness alteration, transient 12/26/2013  . Low serum testosterone level 12/25/2013  . TIA (transient ischemic attack) 10/10/2013  . Radiation proctitis 11/28/2012  . Colon cancer screening 11/28/2012  . Murmur 02/23/2011  . Type 2 diabetes mellitus without complication 17/61/6073  . Essential hypertension, benign 11/26/2010  . Impaired fasting glucose 11/26/2010  . Pure hypercholesterolemia 11/26/2010  . Prostate cancer 11/26/2010    Expected Discharge Date: Expected Discharge Date: 12/10/14  Team Members Present: Physician leading conference: Dr. Alger [name] Social Worker Present: Jeffrey Pall, LCSW Nurse Present: Jeffrey Roberts, RN PT Present: Jeffrey [name],  PT OT Present: Jeffrey [name], OT SLP Present: Jeffrey [name], SLP PPS Coordinator present : Jeffrey Nakayama, RN, CRRN     Current Status/Progress Goal Weekly Team Focus  Medical   RMSF, cva, rapid improvement, volume balance  finalize medically for dc  med mgt, cv meds adjusted   Bowel/Bladder   Continent bowel and bladder; mild urgency with Lasix.    No s/s infection. Remain continent  Monitor.  Continue Florastor for loose stool.   Swallow/Nutrition/ Hydration             ADL's   mod I for bathing/dressing   mod I   high-level balance, ambulation, cognitive-dual task, activity tolerance/endurance, patient/family education    Mobility   Mod I without AD, Supervision for stairs  Mod I without AD, Supervision for stairs  dynamic standing balance, activity tolerance, strengthening, pt/family education   Communication             Safety/Cognition/ Behavioral Observations  Mod I-Supervision  Mod I  Discharge home today with 24 hour supervision    Pain   Denies  3/10  Monitor for non-verbal S/S pain.   Skin   Rash resolving; Mild redness at buttocks, blanches.  No s/s infection, no breakdown, injury this adm.  Moniotr    Rehab Goals Patient on target to meet rehab goals: Yes *See Care Plan and progress notes for long and short-term goals.  Barriers to Discharge: none    Possible Resolutions to Barriers:  n/a    Discharge Planning/Teaching Needs:  home with wife to provide 24/7 supervision  completed   Team  Discussion:  Pt ready for d/c today - all goals met  Revisions to Treatment Plan:  None   Continued Need for Acute Rehabilitation Level of Care: The patient requires daily medical management by a physician with specialized training in physical medicine and rehabilitation for the following conditions: Daily direction of a multidisciplinary physical rehabilitation program to ensure safe treatment while eliciting the highest outcome that is of practical value to the  patient.: Yes Daily medical management of patient stability for increased activity during participation in an intensive rehabilitation regime.: Yes Daily analysis of laboratory values and/or radiology reports with any subsequent need for medication adjustment of medical intervention for : Post surgical problems;Neurological problems  Jeffrey [name] 12/10/2014, 3:38 PM

## 2014-12-13 LAB — B. BURGDORFI ANTIBODIES, CSF
Albumin CSF: 39.3 mg/dL (ref 10.4–43.2)
Albumin Index Ratio: 12.5
Albumin Serum: 3155 mg/dl — ABNORMAL LOW (ref 3700–5100)
B. BURGDORFERI IGM CSF: 0.06
B. BURGDORFERI IGM SERUM: 0.88
B. Burgdorferi IgG Serum: <0.2
CNS IgG Synthesis Rate: 11.6
IGG TOTAL CSF: 6.52 mg/dL — AB (ref 0.00–3.06)
IGM TOTAL CSF: 0.6 mg/dL (ref 0.0–0.6)
IgG (loc): 0.653
IgG Index: 0.77
IgG Total Serum: 679 mg/dl (ref 540–1423)
IgM (loc): 0
IgM Index: 0.26
IgM Total Serum: 198 mg/dl (ref 52–217)

## 2014-12-16 ENCOUNTER — Ambulatory Visit: Payer: Medicare Other | Attending: Physical Medicine & Rehabilitation | Admitting: Physical Therapy

## 2014-12-16 ENCOUNTER — Telehealth: Payer: Self-pay | Admitting: *Deleted

## 2014-12-16 ENCOUNTER — Encounter: Payer: Self-pay | Admitting: Physical Therapy

## 2014-12-16 DIAGNOSIS — R2689 Other abnormalities of gait and mobility: Secondary | ICD-10-CM | POA: Diagnosis not present

## 2014-12-16 DIAGNOSIS — R531 Weakness: Secondary | ICD-10-CM | POA: Diagnosis not present

## 2014-12-16 NOTE — Telephone Encounter (Signed)
Called patient to follow up on recent rehab discharge. He is doing well, had his first PT today and told he did well. Did not have any questions that I could answer but I did tell him to call back if he did. I was to schedule his follow up for TOC/hospital follow up appt-his wife had already called and scheduled for next Monday 12/23/14.

## 2014-12-16 NOTE — Therapy (Signed)
[place] MAIN [facility] SERVICES [address] [place], [place], [address] Phone: [phone]   Fax:  [phone]  Physical Therapy Evaluation  Patient Details  Name: Jeffrey [name] MRN: [id number] Date of Birth: 1933-01-24 Referring Provider:  Meredith Staggers, MD  Encounter Date: 12/16/2014      PT End of Session - 12/16/14 0948    Visit Number 1   Number of Visits 9   Date for PT Re-Evaluation 01/13/15   Authorization Type gcode 1   Authorization Time Period 10    PT Start Time 0850   PT Stop Time 0949   PT Time Calculation (min) 59 min   Activity Tolerance Patient tolerated treatment well   Behavior During Therapy [facility] for tasks assessed/performed      Past Medical History  Diagnosis Date  . Hypertension   . Hypercholesteremia   . Impaired fasting glucose   . Prostate cancer radiation + seeding implant (Dr.[name])  . Rotator cuff tear, right 10/2010    supraspinatous and infraspinatous  . Diabetes mellitus   . Macular degeneration   . Heart murmur     mild aortic stenosis  . Radiation proctitis 11/2012    treated with APC ablation and Canasa suppositories (Dr. Hilarie Fredrickson)  . TIA (transient ischemic attack) 10/02/13    hosp at [facility] x 1 night  . 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 Aortic atherosclerosis 11/25/2014  . Acute encephalopathy 11/27/2014  . RMSF [facility] spotted fever) 10/2014    (hosp with FUO, encephalopathy)  . Multiple lacunar infarcts   . Acute CVA (cerebrovascular accident)   . Acute on chronic combined systolic and diastolic CHF (congestive heart failure)     Past Surgical History  Procedure Laterality Date  . Tonsillectomy and adenoidectomy  age 10  . Prostate seed implant    . Rotator cuff repair  06/16/2011    right (Dr. Berenice Primas)  . Colonoscopy N/A 11/28/2012    Procedure: COLONOSCOPY;  Surgeon: Jerene Bears, MD;  Location: [facility];  Service: Gastroenterology;   Laterality: N/A;    There were no vitals filed for this visit.  Visit Diagnosis:  Abnormality of gait due to impairment of balance - Plan: PT plan of care cert/re-cert  Weakness - Plan: PT plan of care cert/re-cert      Subjective Assessment - 12/16/14 0859    Subjective Patient is a pleasant 81 year old male that was Admitted to the ED on 6/29 due to altered mental status and fever. Patient reports that he was in the hospital for 13 days, with complicated hospital course in which he developed septic shock with respiratory failure requiring intubation and was treated with broad spectrum antibiotics due to presumed PNA.  He was extubated without difficulty. While in the hospital, cardiology consulted 07/03 due to elevated cardiac enzymes; 30 day heart monitor recommended to rule out A fib. MRI performed during hospital stay revealing acute infarct of the anterior lobe. Patient Reports that MD referred to CVA as "slight stroke". Patient received inpatient PT while in the hospital at [facility] and was at the supervision level at the time of discharge for ADLs.   Pertinent History TIA, CVA, Possible [place] spotted fever in June 2016.    How long can you stand comfortably? Tiredness noted with walking for a couple block.    Diagnostic tests MRI : revealing acute nonhemorrhagic white matter infarct in the anterior right frontal lobe  Patient Stated Goals improve balance, improve gait.    Currently in Pain? No/denies            [facility] PT Assessment - 12/16/14 0904    Assessment   Medical Diagnosis CVA.    Onset Date/Surgical Date 11/26/14   Hand Dominance Right   Next MD Visit in August 2016    Prior Therapy PT provided in hospital as well as with inpatient rehabilitation. Patient reports significant improvement s/p Hospital and inpatient rehabilitation stay.     Precautions   Precautions Fall   Balance Screen   Has the patient fallen in the past 6 months No   Has the patient had  a decrease in activity level because of a fear of falling?  Yes   Is the patient reluctant to leave their home because of a fear of falling?  Yes   [place] residence   Living Arrangements Spouse/significant other   Available Help at Discharge Family   Type of [place] to enter   Entrance Stairs-Number of Steps 3   Entrance Stairs-Rails Right   [place] One level   Prior Function   Level of Independence Independent   Vocation Retired   Leisure Driving. mowing the yard.    Cognition   Overall Cognitive Status Within Functional Limits for tasks assessed   Observation/Other Assessments   Observations Slight tremor while holding clib board and pen. Patient reports this is not a new issue.    Activities of Balance Confidence Scale (ABC Scale)  71% (<67% indicates increased fall risk, <80% indicates decreased activies. )   Sensation   Additional Comments No numbness. tingling reported.    Coordination   Gross Motor Movements are Fluid and Coordinated No   Finger Nose Finger Test dysmetria noted bilaterally and past shooting on the L UE.    Heel Shin Test WNL   Posture/Postural Control   Posture Comments Patient demonstrates slight increased lumbar lordosis in stance, and slight increased thoracic kyphosis in sitting and stance.    AROM   Overall AROM Comments AROM WFL for bilateral LE and UE.    Strength   Overall Strength Comments R shoulder strength for flexion, abduction, and ER 4-/5. R shoulder IR 4/5. Patient reports he has had decreased strength in this arm since rotator cuff repair in 2013.  R hip flexion 4-/5, L hip flexion 4/5, Bilateral hip flexion 4/5 . All L UE 4+/5 and all other Bilateral LE 5/5   Ambulation/Gait   Gait Comments Patient ambulates with full reciprocal gait pattern with adequate swing and trunk rotation, as well as bilateral heel strike. Slight increased lumbar lordosis noted with gait.  Patient was  observed to have several instances of decreased foot clearance on the R LE with 6 minute walk.    6 Minute Walk- Baseline   6 Minute Walk- Baseline yes   BP (mmHg) 130/73 mmHg   HR (bpm) 76   02 Sat (%RA) 100 %   Modified Borg Scale for Dyspnea 0- Nothing at all   Perceived Rate of Exertion (Borg) 7- Very, very light   6 Minute walk- Post Test   6 Minute Walk Post Test yes   BP (mmHg) 149/75 mmHg   HR (bpm) 80   02 Sat (%RA) 97 %   Modified Borg Scale for Dyspnea 2- Mild shortness of breath   Perceived Rate of Exertion (Borg) 14-   6 minute walk  test results    Aerobic Endurance Distance Walked 1340   Standardized Balance Assessment   Five times sit to stand comments  16.32 (>15 seconds indicates increased fall risk   10 Meter Walk 1.25m/s (1.23m/s indicates full community ambulator.     Berg Balance Test   Sit to Stand Able to stand without using hands and stabilize independently   Standing Unsupported Able to stand safely 2 minutes   Sitting with Back Unsupported but Feet Supported on Floor or Stool Able to sit safely and securely 2 minutes   Stand to Sit Sits safely with minimal use of hands   Transfers Able to transfer safely, minor use of hands   Standing Unsupported with Eyes Closed Able to stand 10 seconds with supervision   Standing Ubsupported with Feet Together Able to place feet together independently and stand for 1 minute with supervision   From Standing, Reach Forward with Outstretched Arm Can reach forward >12 cm safely (5")   From Standing Position, Pick up Object from [place] to pick up shoe safely and easily   From Standing Position, Turn to Look Behind Over each Shoulder Looks behind from both sides and weight shifts well   Turn 360 Degrees Able to turn 360 degrees safely one side only in 4 seconds or less   Standing Unsupported, Alternately Place Feet on Step/Stool Able to stand independently and complete 8 steps >20 seconds   Standing Unsupported, One Foot in  Front Needs help to step but can hold 15 seconds   Standing on One Leg Able to lift leg independently and hold equal to or more than 3 seconds   Total Score 46                           PT Education - 12/16/14 1340    Education provided Yes   Education Details Plan of care. standardized outcomes.    Person(s) Educated Patient;Spouse   Methods Explanation;Demonstration   Comprehension Verbalized understanding;Returned demonstration             PT Long Term Goals - 12/16/14 1146    PT LONG TERM GOAL #1   Title Patient will be independent in HEP by 01/15/2015 in order to increase balance and strength to improve safety with gait.    Time 4   Period Weeks   Status New   PT LONG TERM GOAL #2   Title Patient will increase Berg Balance Scale to >52 in order to increase function and reduce fall risk  by 01/15/2015   Time 4   Period Weeks   Status New   PT LONG TERM GOAL #3   Title patient will increase Bilateral LE strength to 5/5 in order reach prior level of function and be able to mow yard without difficulty.  by 01/15/2015   Time 4   Period Weeks   Status New   PT LONG TERM GOAL #4   Title Patient will decrease 5xSTS to less than 15 seconds to improve LE power and decrease fall risk  by 01/15/2015    PT LONG TERM GOAL #5   Title Patient will increase 6 minute walk test distance to at least 1572 in order to increase function within the community and at age matched norms  by 01/15/2015   Time 4   Period Weeks   Status New               Plan - 12/16/14  1125    Clinical Impression Statement Patient is a pleasant 81 year old male s/p 13 day complicated hospital stay and CVA on 11/26/2014. Patient reports that he is having trouble walking at home and feels that his balance decreased since the CVA on 6/28 and subsequent hospitalization. Patient was found to have decrease R UE shoulder strength, and decreased hip flexion/extension strength. Coordination was found  to be slightly decreased with noted dysmetria with bilateral Finger to Nose. Patient also demonstrates some balance deficits noted through the 5xSTS (16.3 seconds,) and 46/56  (50% , Moderate fall risk) on the Berg Balance Scale with most difficulties with narrow BOS tasks. Gait speed taken without AD at 1.59m/s (indicating full community ambulation). Patient also was able to ambulate 1340 ft during the 6 minute walk test, which places him in the full community ambulatory level, but less than age match norms of 1572 ft. Skilled PT is recommended to increase balance and strength deficits noted through eval in order to increase safety with gait at home and within the community.     Pt will benefit from skilled therapeutic intervention in order to improve on the following deficits Abnormal gait;Decreased activity tolerance;Decreased balance;Decreased endurance;Decreased coordination;Decreased mobility;Decreased strength;Difficulty walking   Rehab Potential Excellent   Clinical Impairments Affecting Rehab Potential positive: good family support, High baseline. Negative: co-morbidities.    PT Frequency 2x / week   PT Duration 4 weeks   PT Treatment/Interventions ADLs/Self Care Home Management;Cryotherapy;Moist Heat;Gait training;Stair training;Functional mobility training;Therapeutic activities;Therapeutic exercise;Balance training;Neuromuscular re-education;Patient/family education   PT Next Visit Plan DGI, start balance HEP.    PT Home Exercise Plan will address next visit.    Consulted and Agree with Plan of Care Patient;Family member/caregiver          G-Codes - 2015-01-07 0829    Functional Assessment Tool Used Berg Balance assessment, 10 meter walk, clinical judgement   Functional Limitation Mobility: Walking and moving around   Mobility: Walking and Moving Around Current Status [phone]) At least 20 percent but less than 40 percent impaired, limited or restricted   Mobility: Walking and Moving Around  Goal Status [phone]) At least 1 percent but less than 20 percent impaired, limited or restricted       Problem List Patient Active Problem List   Diagnosis Date Noted  . Stroke with cerebral ischemia 12/03/2014  . Pre-diabetes   . Acute on chronic combined systolic and diastolic CHF (congestive heart failure)   . Acute CVA (cerebrovascular accident)   . Left carotid artery stenosis 11/30/2014  . RMSF [facility] spotted fever)   . Pyrexia   . Lacunar infarct, acute   . AKI (acute kidney injury)   . Severe sepsis   . Respiratory distress   . Fever 11/27/2014  . Sepsis 11/27/2014  . Acute encephalopathy 11/27/2014  . Thrombocytopenia 11/27/2014  . Blood poisoning   . Aortic atherosclerosis 11/25/2014  . 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 Awareness alteration, transient 12/26/2013  . Low serum testosterone level 12/25/2013  . TIA (transient ischemic attack) 10/10/2013  . Radiation proctitis 11/28/2012  . Colon cancer screening 11/28/2012  . Murmur 02/23/2011  . Type 2 diabetes mellitus without complication 11/91/4782  . Essential hypertension, benign 11/26/2010  . Impaired fasting glucose 11/26/2010  . Pure hypercholesterolemia 11/26/2010  . Prostate cancer 11/26/2010   Barrie Folk SPT 01-07-15   8:32 AM  This entire session was performed under direct supervision and direction of a licensed therapist. I  have personally read, edited and approve of the note as written.  Hopkins,Margaret, PT, DPT 12/17/2014, 8:32 AM  [place] MAIN [facility] SERVICES [address] [place], [place], [address] Phone: [phone]   Fax:  [phone]

## 2014-12-19 ENCOUNTER — Encounter: Payer: Self-pay | Admitting: Physical Therapy

## 2014-12-19 ENCOUNTER — Ambulatory Visit: Payer: Medicare Other | Admitting: Physical Therapy

## 2014-12-19 DIAGNOSIS — R531 Weakness: Secondary | ICD-10-CM

## 2014-12-19 DIAGNOSIS — R2689 Other abnormalities of gait and mobility: Secondary | ICD-10-CM | POA: Diagnosis not present

## 2014-12-19 NOTE — Patient Instructions (Signed)
SIT TO STAND: No Device   Sit with feet shoulder-width apart, on floor.(Make sure that you are in a chair that won't move like a chair against a wall or couch etc) Lean chest forward, raise hips up from surface. Straighten hips and knees. Weight bear equally on left and right sides. 10___ reps per set, _2__ sets per day, _5__ days per week Place left leg closer to sitting surface.  Copyright  VHI. All rights reserved.  Backward Walking   Walk backward, toes of each foot coming down first. Take long, even strides. Make sure you have a clear pathway with no obstructions when you do this. Stand beside counter and walk backward  And then walk forward doing opposite directions; repeat 10 laps 2x a day at least 5 days a week.  Copyright  VHI. All rights reserved.  Tandem Walking   Stand beside kitchen sink and place one foot in front of the other, lift your hand and try to hold position for 10 sec. Repeat with other foot in front; Repeat 5 reps with each foot in front 5 days a week.Balance: Unilateral   Attempt to balance on left leg, eyes open. Hold _5-10___ seconds.Start with holding onto counter and if you get your balance you can try to let go of counter. Repeat __5__ times per set. Do __1__ sets per session. Do __1__ sessions per day. Keep eyes open:   http://orth.exer.us/29   Copyright  VHI. All rights reserved.     

## 2014-12-19 NOTE — Therapy (Signed)
[place] MAIN [facility] SERVICES [address] [place], [place], [address] Phone: [phone]   Fax:  [phone]  Physical Therapy Treatment  Patient Details  Name: Jeffrey [name] MRN: [id number] Date of Birth: [birth year] Referring Provider:  Meredith Staggers, MD  Encounter Date: 12/19/2014      PT End of Session - 12/19/14 1017    Visit Number 2   Number of Visits 9   Date for PT Re-Evaluation 01/13/15   Authorization Type gcode 2   Authorization Time Period 10    PT Start Time 0812   PT Stop Time 0900   PT Time Calculation (min) 48 min   Equipment Utilized During Treatment Gait belt   Activity Tolerance Patient tolerated treatment well   Behavior During Therapy [facility] for tasks assessed/performed      Past Medical History  Diagnosis Date  . Hypertension   . Hypercholesteremia   . Impaired fasting glucose   . Prostate cancer radiation + seeding implant (Dr.[name])  . Rotator cuff tear, right 10/2010    supraspinatous and infraspinatous  . Diabetes mellitus   . Macular degeneration   . Heart murmur     mild aortic stenosis  . Radiation proctitis 11/2012    treated with APC ablation and Canasa suppositories (Dr. Hilarie Fredrickson)  . TIA (transient ischemic attack) 10/02/13    hosp at [facility] x 1 night  . 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 Aortic atherosclerosis 11/25/2014  . Acute encephalopathy 11/27/2014  . RMSF [facility] spotted fever) 10/2014    (hosp with FUO, encephalopathy)  . Multiple lacunar infarcts   . Acute CVA (cerebrovascular accident)   . Acute on chronic combined systolic and diastolic CHF (congestive heart failure)     Past Surgical History  Procedure Laterality Date  . Tonsillectomy and adenoidectomy  age 75  . Prostate seed implant    . Rotator cuff repair  06/16/2011    right (Dr. Berenice Primas)  . Colonoscopy N/A 11/28/2012    Procedure: COLONOSCOPY;  Surgeon: Jerene Bears, MD;   [facility];  Service: Gastroenterology;  Laterality: N/A;    There were no vitals filed for this visit.  Visit Diagnosis:  Weakness  Abnormality of gait due to impairment of balance      Subjective Assessment - 12/19/14 0816    Subjective Patient reports that he is doing well this AM. No falls since PT eval; and patient reports that he feels his balance has improved over the last couple of days.    Pertinent History TIA, CVA, Possible [place] spotted fever in June 2016.    How long can you stand comfortably? Tiredness noted with walking for a couple block.    Diagnostic tests MRI : revealing acute nonhemorrhagic white matter infarct in the anterior right frontal lobe   Patient Stated Goals improve balance, improve gait.    Currently in Pain? No/denies        Treatment:   nustep level 3   SLS 3x20 seconds  Tandem stance 3x30 seconds  Toe taps on 6 inch step 2x12 Toe taps on 6 Inch cone 2x10 Airex stance normal and narrow BOS x 30 seconds each   airex with eyes closed narrow BOS 3x20 seconds  Airex noramal BOS, head turns 2x20 seconds  airex normal BOS head nod with arm raise 2x10   CGA, min verbal and tactile instruction to increase weight shift to L LE, and increase use  of hip strategy to correct lateral LOB, patient demonstrates slight response to instruction.  Patient noted to have  Increased Shortness of breath following balance exercises.   Standing therex  Hip flexion red tband x12  Hip extension red tband x12  hip abduction red tband x12   sit to stand 2x10 (one set with push from thighs, one without HHA.)   CGA and verbal and tactile instruction with therex. Cues to decrease trunk movement and decrease speed of movement for more control     Vitals taken s/p exercises HR: 91bpm  BP: 103/52mmHg SpO2: 100%               PT Education - 12/19/14 1016    Education provided Yes   Education Details HEP, see patient instructions,  balance and strength training.    Person(s) Educated Patient   Methods Explanation;Demonstration;Tactile cues;Verbal cues   Comprehension Verbalized understanding;Returned demonstration;Verbal cues required;Tactile cues required             PT Long Term Goals - 12/16/14 1146    PT LONG TERM GOAL #1   Title Patient will be independent in HEP by 01/15/2015 in order to increase balance and strength to improve safety with gait.    Time 4   Period Weeks   Status New   PT LONG TERM GOAL #2   Title Patient will increase Berg Balance Scale to >52 in order to increase function and reduce fall risk  by 01/15/2015   Time 4   Period Weeks   Status New   PT LONG TERM GOAL #3   Title patient will increase Bilateral LE strength to 5/5 in order reach prior level of function and be able to mow yard without difficulty.  by 01/15/2015   Time 4   Period Weeks   Status New   PT LONG TERM GOAL #4   Title Patient will decrease 5xSTS to less than 15 seconds to improve LE power and decrease fall risk  by 01/15/2015    PT LONG TERM GOAL #5   Title Patient will increase 6 minute walk test distance to at least 1572 in order to increase function within the community and at age matched norms  by 01/15/2015   Time 4   Period Weeks   Status New               Plan - 12/19/14 1017    Clinical Impression Statement Patient instructed in balance exercises and LE strengthening on this day. Patient demonstrates difficulty with Single leg stance tasks on L LE with difficulty with weight shifting properly. Verbal and tactile cues to increase weight shift, slight response to verbal instruction. Some difficulty with maintaining balance also noted with head nods. Patient demonstrates decreased strength on L LE, min verbal  and tactile instruction needed for proper standing exercise technique. Continued skilled PT is recommended to increased strength, improve balance and gait to return patient to PLOF.    Pt will  benefit from skilled therapeutic intervention in order to improve on the following deficits Abnormal gait;Decreased activity tolerance;Decreased balance;Decreased endurance;Decreased coordination;Decreased mobility;Decreased strength;Difficulty walking   Rehab Potential Excellent   Clinical Impairments Affecting Rehab Potential positive: good family support, High baseline. Negative: co-morbidities.    PT Frequency 2x / week   PT Duration 4 weeks   PT Treatment/Interventions ADLs/Self Care Home Management;Cryotherapy;Moist Heat;Gait training;Stair training;Functional mobility training;Therapeutic activities;Therapeutic exercise;Balance training;Neuromuscular re-education;Patient/family education   PT Next Visit Plan DGI, review HEP, balance activites.  PT Home Exercise Plan see patient instructions.    Consulted and Agree with Plan of Care Patient;Family member/caregiver        Problem List Patient Active Problem List   Diagnosis Date Noted  . Stroke with cerebral ischemia 12/03/2014  . Pre-diabetes   . Acute on chronic combined systolic and diastolic CHF (congestive heart failure)   . Acute CVA (cerebrovascular accident)   . Left carotid artery stenosis 11/30/2014  . RMSF [facility] spotted fever)   . Pyrexia   . Lacunar infarct, acute   . AKI (acute kidney injury)   . Severe sepsis   . Respiratory distress   . Fever 11/27/2014  . Sepsis 11/27/2014  . Acute encephalopathy 11/27/2014  . Thrombocytopenia 11/27/2014  . Blood poisoning   . Aortic atherosclerosis 11/25/2014  . 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 Awareness alteration, transient 12/26/2013  . Low serum testosterone level 12/25/2013  . TIA (transient ischemic attack) 10/10/2013  . Radiation proctitis 11/28/2012  . Colon cancer screening 11/28/2012  . Murmur 02/23/2011  . Type 2 diabetes mellitus without complication 51/70/0174  . Essential hypertension, benign  11/26/2010  . Impaired fasting glucose 11/26/2010  . Pure hypercholesterolemia 11/26/2010  . Prostate cancer 11/26/2010  Barrie Folk, SPT This entire session was performed under direct supervision and direction of a licensed therapist . I have personally read, edited and approve of the note as written.   Hopkins,Margaret, PT, DPT 12/19/2014, 11:09 AM  [place] MAIN [facility] SERVICES [address] [place], [place], [address] Phone: [phone]   Fax:  [phone]

## 2014-12-23 ENCOUNTER — Ambulatory Visit (INDEPENDENT_AMBULATORY_CARE_PROVIDER_SITE_OTHER): Payer: Medicare Other | Admitting: Family Medicine

## 2014-12-23 ENCOUNTER — Encounter: Payer: Self-pay | Admitting: Family Medicine

## 2014-12-23 ENCOUNTER — Ambulatory Visit: Payer: Medicare Other | Admitting: Physical Therapy

## 2014-12-23 VITALS — BP 132/80 | HR 60 | Temp 98.1°F | Ht 70.0 in | Wt 157.4 lb

## 2014-12-23 DIAGNOSIS — I6522 Occlusion and stenosis of left carotid artery: Secondary | ICD-10-CM

## 2014-12-23 DIAGNOSIS — Z5181 Encounter for therapeutic drug level monitoring: Secondary | ICD-10-CM | POA: Diagnosis not present

## 2014-12-23 DIAGNOSIS — R945 Abnormal results of liver function studies: Secondary | ICD-10-CM

## 2014-12-23 DIAGNOSIS — I6381 Other cerebral infarction due to occlusion or stenosis of small artery: Secondary | ICD-10-CM

## 2014-12-23 DIAGNOSIS — E7800 Pure hypercholesterolemia, unspecified: Secondary | ICD-10-CM

## 2014-12-23 DIAGNOSIS — R7989 Other specified abnormal findings of blood chemistry: Secondary | ICD-10-CM

## 2014-12-23 DIAGNOSIS — I639 Cerebral infarction, unspecified: Secondary | ICD-10-CM

## 2014-12-23 DIAGNOSIS — A770 Spotted fever due to Rickettsia rickettsii: Secondary | ICD-10-CM

## 2014-12-23 DIAGNOSIS — I10 Essential (primary) hypertension: Secondary | ICD-10-CM | POA: Diagnosis not present

## 2014-12-23 DIAGNOSIS — I700 Atherosclerosis of aorta: Secondary | ICD-10-CM | POA: Diagnosis not present

## 2014-12-23 DIAGNOSIS — I5043 Acute on chronic combined systolic (congestive) and diastolic (congestive) heart failure: Secondary | ICD-10-CM

## 2014-12-23 DIAGNOSIS — R5383 Other fatigue: Secondary | ICD-10-CM | POA: Diagnosis not present

## 2014-12-23 DIAGNOSIS — I480 Paroxysmal atrial fibrillation: Secondary | ICD-10-CM | POA: Diagnosis not present

## 2014-12-23 DIAGNOSIS — R799 Abnormal finding of blood chemistry, unspecified: Secondary | ICD-10-CM | POA: Diagnosis not present

## 2014-12-23 LAB — CBC WITH DIFFERENTIAL/PLATELET
BASOS ABS: 0.1 10*3/uL (ref 0.0–0.1)
Basophils Relative: 1 % (ref 0–1)
Eosinophils Absolute: 0.3 10*3/uL (ref 0.0–0.7)
Eosinophils Relative: 3 % (ref 0–5)
HCT: 38.8 % — ABNORMAL LOW (ref 39.0–52.0)
Hemoglobin: 12.8 g/dL — ABNORMAL LOW (ref 13.0–17.0)
Lymphocytes Relative: 30 % (ref 12–46)
Lymphs Abs: 3.1 10*3/uL (ref 0.7–4.0)
MCH: 31.5 pg (ref 26.0–34.0)
MCHC: 33 g/dL (ref 30.0–36.0)
MCV: 95.6 fL (ref 78.0–100.0)
MPV: 9.3 fL (ref 8.6–12.4)
Monocytes Absolute: 0.9 10*3/uL (ref 0.1–1.0)
Monocytes Relative: 9 % (ref 3–12)
Neutro Abs: 5.9 10*3/uL (ref 1.7–7.7)
Neutrophils Relative %: 57 % (ref 43–77)
PLATELETS: 322 10*3/uL (ref 150–400)
RBC: 4.06 MIL/uL — ABNORMAL LOW (ref 4.22–5.81)
RDW: 13.9 % (ref 11.5–15.5)
WBC: 10.4 10*3/uL (ref 4.0–10.5)

## 2014-12-23 NOTE — Progress Notes (Signed)
Chief Complaint  Patient presents with  . Hospitalization Follow-up    follow up from recent hospitalization. Feeling so-so, little bit weak.    Patient presents for hospital follow-up.  He was hospitalized 6/29-7/5, and then in rehab from 7/5-7/12.    He was admitted on 11/27/14 with confusion,difficulty walking, fever and mild right sided weakness. He ultimately also developed a rash, and workup revealed RMSF; he has completed course of doxycycline.  MRI brain revealed acute nonhemorrhagic white matter infarct in the anterior right frontal lobe. He developed septic shock with respiratory failure requiring intubation and was treated with broad spectrum antibiotics due to presumed PNA. Carotid dopplers showed L- 80-89% ICA stenosis. Neurology recommended low dose ASA for secondary stroke prevention and to follow up with [facility] past discharge for asymptomatic L-ICA stenosis. Cardiology consulted 07/03 due to elevated cardiac enzymes and bigeminy with question of PAF therefore 30 day monitor recommended to rule out A fib. He is currently wearing the monitor.  He denies any chest pain, palpitations or shortness of breath, just some ongoing generalized weakness.  Priior to transfer to rehab he was treated with diuretics for SOB due to fluid overload.He had some worsening renal function and hypokalemia related to diuretics; adjustments were made and labs are due.  It was also noted that he had elevated LFT's.  Pravastatin was stopped, and recheck is due. He has been getting outpatient rehab since discharge. He is feeling like his balance is better, but still doesn't feel very strong.  He is feeling "weak and sluggish".  He is complaining of finger pain/arthritis. He was told he was taking too much tylenol. He used to take 2 BID, but is currently only taking 1 twice daily, but pain isn't as well controlled (he was only taking 4/day, with max of 6 per wife). Confusion is much better.  He is currently wearing a  heart monitor, with about a week left.  They saw Dr. Caryl Comes in the hospital. He needs referral for follow up.  He previously saw Dr. Percival Spanish, and is hoping to be able to see him in [place] office.   Denies palpitations.  He has slight dizziness with standing.  No fainting spells. Denies chest pain.  Slight head discomfort, no true headaches  He has not had any alcohol since discharge--tasted it once, and reports he lost the taste for it.  PMH, PSH, SH and hospital records and imaging were all reviewed.  Outpatient Encounter Prescriptions as of 12/23/2014  Medication Sig Note  . acetaminophen (TYLENOL ARTHRITIS PAIN) 650 MG CR tablet Take 1 tablet (650 mg total) by mouth every 8 (eight) hours as needed. (Patient taking differently: Take 650 mg by mouth 2 (two) times daily. ) 12/23/2014: Decreased to 1 pill twice daily after elevated liver tests in hospital  . aspirin 81 MG tablet Take 81 mg by mouth 2 (two) times daily.    . furosemide (LASIX) 20 MG tablet Take 1 tablet (20 mg total) by mouth daily.   . hydrocortisone cream 1 % Apply 1 application topically as needed (for ankles). 12/23/2014: Uses prn for rash on ankles  . lisinopril (PRINIVIL,ZESTRIL) 20 MG tablet Take 1 tablet (20 mg total) by mouth daily.   . metoprolol succinate (TOPROL-XL) 50 MG 24 hr tablet Take 1 tablet (50 mg total) by mouth 2 (two) times daily with a meal. Take with or immediately following a meal.   . Multiple Vitamins-Minerals (PRESERVISION AREDS) CAPS Take 1 capsule by mouth every morning.   [name]  potassium chloride (K-DUR,KLOR-CON) 10 MEQ tablet Take 1 tablet (10 mEq total) by mouth 2 (two) times daily.   . [DISCONTINUED] white petrolatum (VASELINE) GEL Topical use as needed for lips care   . albuterol (PROVENTIL HFA;VENTOLIN HFA) 108 (90 BASE) MCG/ACT inhaler Inhale 2 puffs into the lungs every 6 (six) hours as needed for wheezing or shortness of breath. (Patient not taking: Reported on 12/23/2014) 12/23/2014: Not using   . doxylamine, Sleep, (EQ SLEEP AID) 25 MG tablet Take 25 mg by mouth at bedtime as needed for sleep. 12/23/2014: Not taking currently  . hydrocerin (EUCERIN) CREA Apply 1 application topically 2 (two) times daily. (Patient not taking: Reported on 12/23/2014)   . ipratropium-albuterol (DUONEB) 0.5-2.5 (3) MG/3ML SOLN Take 3 mLs by nebulization every 6 (six) hours as needed. (Patient not taking: Reported on 12/23/2014)   . [DISCONTINUED] amLODipine (NORVASC) 2.5 MG tablet TAKE 1 TABLET (2.5 MG TOTAL) BY MOUTH DAILY. 12/23/2014: NOT on med list from rehab  . [DISCONTINUED] doxycycline (VIBRA-TABS) 100 MG tablet Take 1 tablet (100 mg total) by mouth every 12 (twelve) hours. Completes treatment today    No facility-administered encounter medications on file as of 12/23/2014.   No Known Allergies  ROS:  He has lost about 15# since prior to hospitalization.  Appetite is okay.  Denies nausea, vomiting.  Had diarrhea related to antibiotic use, but has resolved.  He denies abdominal pain, headaches (just slight discomfort as noted in HPI); slight dizziness with standing.  No syncope.  No chest pain, palpitations, shortness of breath.  +mild generalized weakness.  Confusion resolved.  No numbness, tingling, focal weakness, memory concerns.  No insomnia, anxiety, moods okay.  No bleeding, bruising, rash.  +hand pain, no other joint pains.  PHYSICAL EXAM: BP 132/80 mmHg  Pulse 60  Temp(Src) 98.1 F (36.7 C) (Tympanic)  Ht 5\' 10"  (1.778 m)  Wt 157 lb 6.4 oz (71.396 kg)  BMI 22.58 kg/m2  Elderly man, in good spirits, in no distress.  He appears thinner and more frail than prior to hospitalization. HEENT: PERRL, EOMI, conjunctiva clear, sclera anicteric.  OP is clear, with moist mucus membranes Neck: no lymphadenopathy, thyromegaly.  No carotid bruit audible Heart: Frequent ectopy (pauses/extra beats) with periods of what sounds to be regular rhythm (cannot completely r/o afib--EKG not done, as he has monitor on  as part of eval). +3/6 murmur, loudest at RUSB, unchanged Lungs: clear bilaterally Back: no CVA tenderness Abdomen: soft, nontender. No organomegaly or mass Extremities: no edema, normal pulses Skin: no rash, normal turgor Psych: normal mood, affect, hygiene and grooming Neuro: alert and oriented.  Gait is somewhat slow, otherwise normal. Cranial nerves intact     Chemistry      Component Value Date/Time   NA 139 12/09/2014 0535   NA 137 10/01/2013 1719   K 4.7 12/09/2014 0535   K 3.6 10/01/2013 1719   CL 105 12/09/2014 0535   CL 101 10/01/2013 1719   CO2 28 12/09/2014 0535   CO2 27 10/01/2013 1719   BUN 15 12/09/2014 0535   BUN 17 10/01/2013 1719   CREATININE 0.85 12/09/2014 0535   CREATININE 0.82 04/10/2014 0925   CREATININE 0.87 10/01/2013 1719      Component Value Date/Time   CALCIUM 8.9 12/09/2014 0535   CALCIUM 9.4 10/01/2013 1719   ALKPHOS 45 12/06/2014 0426   ALKPHOS 46 10/01/2013 1719   AST 139* 12/06/2014 0426   AST 31 10/01/2013 1719   ALT 290* 12/06/2014 0426  ALT 45 10/01/2013 1719   BILITOT 0.9 12/06/2014 0426   BILITOT 0.4 10/01/2013 1719     Lab Results  Component Value Date   HGBA1C 6.2* 11/30/2014   ASSESSMENT/PLAN:  Essential hypertension, benign - well controlled on current regimen  Other fatigue - Plan: CBC with Differential/Platelet  Elevated LFTs - recheck today.  If remains elevated, we discussed cutting back further on tylenol continuing to avoid alcohol.  Not to resume Pravastatin until told by [place] - Plan: Comprehensive metabolic panel  Medication monitoring encounter - Plan: Comprehensive metabolic panel  Pure hypercholesterolemia - pravastatin temporarily being held.  restart once LFT's normalize  Aortic atherosclerosis  RMSF [facility] spotted fever) - completed course of antibiotics  Left carotid artery stenosis  Lacunar infarct, acute  Acute on chronic combined systolic and diastolic CHF (congestive heart failure) -  EF of 40-45% with severe apical hypokinesis noted on echo.  f/u with cardiology  Paroxysmal atrial fibrillation - currently wearing monitor.  Needs f/u arranged with cardiologist   Refer back to Dr. Percival Spanish (in [facility] office) for cardiology f/u.  He has previously seen him.  Currently wearing a heart monitor, upon discharge from rehab.   Refer to vascular surgery for LICA stenosis  F/u as scheduled with [name] 8/31 Dr. Naaman Plummer 9/6.  F/u here as previously scheduled (November), sooner prn.

## 2014-12-23 NOTE — Patient Instructions (Signed)
Continue your current medications. Your blood pressure is well controlled.  We will refer you to cardiologist and to vascular surgeon, as we discussed.  We will let you know if/when you can restart the pravastatin (cholesterol medication).

## 2014-12-24 ENCOUNTER — Ambulatory Visit: Payer: Medicare Other

## 2014-12-24 ENCOUNTER — Encounter: Payer: Self-pay | Admitting: Family Medicine

## 2014-12-24 VITALS — BP 137/53 | HR 81

## 2014-12-24 DIAGNOSIS — I480 Paroxysmal atrial fibrillation: Secondary | ICD-10-CM | POA: Insufficient documentation

## 2014-12-24 DIAGNOSIS — R531 Weakness: Secondary | ICD-10-CM

## 2014-12-24 DIAGNOSIS — R2689 Other abnormalities of gait and mobility: Secondary | ICD-10-CM | POA: Diagnosis not present

## 2014-12-24 LAB — COMPREHENSIVE METABOLIC PANEL
ALT: 36 U/L (ref 9–46)
AST: 23 U/L (ref 10–35)
Albumin: 4.4 g/dL (ref 3.6–5.1)
Alkaline Phosphatase: 39 U/L — ABNORMAL LOW (ref 40–115)
BILIRUBIN TOTAL: 0.6 mg/dL (ref 0.2–1.2)
BUN: 18 mg/dL (ref 7–25)
CO2: 23 mmol/L (ref 20–31)
CREATININE: 0.99 mg/dL (ref 0.70–1.11)
Calcium: 9.7 mg/dL (ref 8.6–10.3)
Chloride: 99 mmol/L (ref 98–110)
Glucose, Bld: 110 mg/dL — ABNORMAL HIGH (ref 65–99)
Potassium: 5.1 mmol/L (ref 3.5–5.3)
Sodium: 136 mmol/L (ref 135–146)
Total Protein: 7.8 g/dL (ref 6.1–8.1)

## 2014-12-24 NOTE — Therapy (Signed)
[place] [facility] [address] [place], [address] Phone: [phone]   Fax:  [phone]  Physical Therapy Treatment  Patient Details  Name: Jeffrey [name] MRN: [id number] Date of Birth: 06-11-1932 Referring Provider:  Meredith Staggers, MD  Encounter Date: 12/24/2014      PT End of Session - 12/24/14 0918    Visit Number 3   Number of Visits 9   Date for PT Re-Evaluation 01/13/15   Authorization Type gcode   Authorization Time Period 10    PT Start Time 0815   PT Stop Time 0900   PT Time Calculation (min) 45 min   Equipment Utilized During Treatment Gait belt   Activity Tolerance Patient tolerated treatment well   Behavior During Therapy [facility] for tasks assessed/performed      Past Medical History  Diagnosis Date  . Hypertension   . Hypercholesteremia   . Impaired fasting glucose   . Prostate cancer radiation + seeding implant (Dr.[name])  . Rotator cuff tear, right 10/2010    supraspinatous and infraspinatous  . Diabetes mellitus   . Macular degeneration   . Heart murmur     mild aortic stenosis  . Radiation proctitis 11/2012    treated with APC ablation and Canasa suppositories (Dr. Hilarie Fredrickson)  . TIA (transient ischemic attack) 10/02/13    hosp at [facility] x 1 night  . 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 Aortic atherosclerosis 11/25/2014  . Acute encephalopathy 11/27/2014  . RMSF [facility] spotted fever) 10/2014    (hosp with FUO, encephalopathy)  . Multiple lacunar infarcts   . Acute CVA (cerebrovascular accident)   . Acute on chronic combined systolic and diastolic CHF (congestive heart failure)     Past Surgical History  Procedure Laterality Date  . Tonsillectomy and adenoidectomy  age 32  . Prostate seed implant    . Rotator cuff repair  06/16/2011    right (Dr. Berenice Primas)  . Colonoscopy N/A 11/28/2012    Procedure: COLONOSCOPY;  Surgeon: Jerene Bears, MD;   Location: [facility];  Service: Gastroenterology;  Laterality: N/A;    Filed Vitals:   12/24/14 0825  BP: 137/53  Pulse: 81  SpO2: 99%    Visit Diagnosis:  Weakness  Abnormality of gait due to impairment of balance      Subjective Assessment - 12/24/14 0839    Subjective Pt states that he is feeling well this morning. He had a follow-up appointment with his GP and is currently wearing a 30 day heart monitor. Pt is waiting to hear from surgeon regarding carotid endarterectomy. Pt does state that he had a fall yesterday when getting out of the car after returning home from his doctor's appointment. Pt denies head trauma during fall and states that he only has an abrasion on his R elbow. Wife states that pt was agitated briefly when she was helping him off the floor but his subsided quickly. Pt has not started his home exercise program yet.    Pertinent History TIA, CVA, Possible [place] spotted fever in June 2016.    How long can you stand comfortably? Tiredness noted with walking for a couple block.    Diagnostic tests MRI : revealing acute nonhemorrhagic white matter infarct in the anterior right frontal lobe   Patient Stated Goals improve balance, improve gait.    Currently in Pain? No/denies       OBJECTIVE: NuStep L3 x 5  minutes during history; Wide and narrow stance balance with eyes closed x 30 seconds each; Tandem balance 2 x 30 seconds; Tandem gait in hallway x 4 laps; Backward ambulation in hallway x 4 laps; Airex balance wide and narrow stance with eyes closed; Airex toe taps to 6" step x 10 alternating LE; Airex wide and narrow stance balance with horizontal head turns (no vertical head turns due to severe carotid stenosis; Orthostatic vitals obtained from patient as follows: supine: BP: 147/56 HR: 77; seated: BP: 109/52, HR: 112; standing: BP: 79/45, HR: 112; Listened to apical pulse with noted pauses. Reviewed PCP note from yesterday, Education provided to  patient regarding orthostatic hypotension, fluid intake, and staying near stable surface with position changes until safe. Wife and patient re-educated regarding signs/symptoms of CVA and importance of immediate intervention (call 911); HEP review: discontinue sit to stand due to orthostatic hypotension.                            PT Education - 12/24/14 0916    Education provided Yes   Education Details HEP; discontinue sit to stand. Education about orthostatic hypotension, fluid intake, and staying near safe surface for brief period when changing positions   Person(s) Educated Patient;Spouse   Methods Explanation;Demonstration;Verbal cues   Comprehension Verbalized understanding;Returned demonstration;Verbal cues required             PT Long Term Goals - 12/16/14 1146    PT LONG TERM GOAL #1   Title Patient will be independent in HEP by 01/15/2015 in order to increase balance and strength to improve safety with gait.    Time 4   Period Weeks   Status New   PT LONG TERM GOAL #2   Title Patient will increase Berg Balance Scale to >52 in order to increase function and reduce fall risk  by 01/15/2015   Time 4   Period Weeks   Status New   PT LONG TERM GOAL #3   Title patient will increase Bilateral LE strength to 5/5 in order reach prior level of function and be able to mow yard without difficulty.  by 01/15/2015   Time 4   Period Weeks   Status New   PT LONG TERM GOAL #4   Title Patient will decrease 5xSTS to less than 15 seconds to improve LE power and decrease fall risk  by 01/15/2015    PT LONG TERM GOAL #5   Title Patient will increase 6 minute walk test distance to at least 1572 in order to increase function within the community and at age matched norms  by 01/15/2015   Time 4   Period Weeks   Status New               Plan - 12/24/14 5009    Clinical Impression Statement Pt educated about HEP. Education also provided to patient and wife  regarding orthostatic hypotension, drinking plenty of fluids, and safety with position changes. Discontinued sit to stand exercise due to severe drop in BP from seated to standing position with orthostatic vitals. Left message for Dr. Tomi Bamberger, patient's PCP, regarding fall yesterday as well as orthostatic vitals. Unclear if patient is the best candidate for therapy at [facility] due to comorbidities as well as need for parallel bars given his balance deficits. Will defer to primary treating therapist upon his return. Pt encouraged to initiate rest of HEP and follow-up as scheduled with PT.  Pt will benefit from skilled therapeutic intervention in order to improve on the following deficits Abnormal gait;Decreased activity tolerance;Decreased balance;Decreased endurance;Decreased coordination;Decreased mobility;Decreased strength;Difficulty walking   Rehab Potential Excellent   Clinical Impairments Affecting Rehab Potential positive: good family support, High baseline. Negative: co-morbidities.    PT Frequency 2x / week   PT Duration 4 weeks   PT Treatment/Interventions ADLs/Self Care Home Management;Cryotherapy;Moist Heat;Gait training;Stair training;Functional mobility training;Therapeutic activities;Therapeutic exercise;Balance training;Neuromuscular re-education;Patient/family education   PT Next Visit Plan Review HEP, progress balance activites. Complete DGI as appropriate    PT Home Exercise Plan see patient instructions.    Consulted and Agree with Plan of Care Patient;Family member/caregiver   Family Member Consulted Wife        Problem List Patient Active Problem List   Diagnosis Date Noted  . Stroke with cerebral ischemia 12/03/2014  . Pre-diabetes   . Acute on chronic combined systolic and diastolic CHF (congestive heart failure)   . Acute CVA (cerebrovascular accident)   . Left carotid artery stenosis 11/30/2014  . RMSF [facility] spotted fever)   . Pyrexia   . Lacunar  infarct, acute   . AKI (acute kidney injury)   . Severe sepsis   . Respiratory distress   . Fever 11/27/2014  . Sepsis 11/27/2014  . Acute encephalopathy 11/27/2014  . Thrombocytopenia 11/27/2014  . Blood poisoning   . Aortic atherosclerosis 11/25/2014  . 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 Awareness alteration, transient 12/26/2013  . Low serum testosterone level 12/25/2013  . TIA (transient ischemic attack) 10/10/2013  . Radiation proctitis 11/28/2012  . Colon cancer screening 11/28/2012  . Murmur 02/23/2011  . Type 2 diabetes mellitus without complication 63/78/5885  . Essential hypertension, benign 11/26/2010  . Impaired fasting glucose 11/26/2010  . Pure hypercholesterolemia 11/26/2010  . Prostate cancer 11/26/2010    Phillips Grout PT, DPT   Huprich,Jason 12/24/2014, 9:22 AM  [place] [facility] [address] [place], [address] Phone: [phone]   Fax:  [phone]

## 2014-12-25 ENCOUNTER — Telehealth: Payer: Self-pay | Admitting: *Deleted

## 2014-12-25 NOTE — Telephone Encounter (Signed)
Therapist from rehab at [facility] called yesterday and left a message on my vm. Patient had a fall getting out of the car Monday after appt with you. Has some abrasions on his r elbow but otherwise ok. Therapist wanted to let you know that they did orthostatics yesterday and lying down it was 147/56, sitting 109/52 and standing 79/45, patient was only slightly dizzy. They did discuss increasing fluid intake and fall safrty. His heartrate was in the 40's at times. Just an FYI.

## 2014-12-25 NOTE — Telephone Encounter (Signed)
Noted.  He definitely needs to drink more fluids.  He might ultimately need his diuretic dose decreased. Please schedule his cardiology appt.

## 2014-12-26 ENCOUNTER — Telehealth: Payer: Self-pay

## 2014-12-26 NOTE — Telephone Encounter (Signed)
Patient contacted regarding discharge from [facility] on 12/03/14.  Patient understands to follow up with provider Christell Faith, PA-C on 01/10/15 at 2:30 at [facility] [place]. Patient understands discharge instructions? yes Patient understands medications and regiment? yes Patient understands to bring all medications to this visit? yes

## 2014-12-27 ENCOUNTER — Other Ambulatory Visit: Payer: Self-pay | Admitting: *Deleted

## 2014-12-27 ENCOUNTER — Ambulatory Visit: Payer: Medicare Other | Admitting: Physical Therapy

## 2014-12-27 DIAGNOSIS — I6522 Occlusion and stenosis of left carotid artery: Secondary | ICD-10-CM

## 2014-12-30 ENCOUNTER — Ambulatory Visit: Payer: Medicare Other | Admitting: Physical Therapy

## 2014-12-30 ENCOUNTER — Ambulatory Visit: Payer: Medicare Other | Attending: Physical Medicine & Rehabilitation | Admitting: Physical Therapy

## 2014-12-30 DIAGNOSIS — R2689 Other abnormalities of gait and mobility: Secondary | ICD-10-CM | POA: Insufficient documentation

## 2014-12-30 DIAGNOSIS — R531 Weakness: Secondary | ICD-10-CM | POA: Diagnosis not present

## 2014-12-30 NOTE — Therapy (Signed)
[place] [facility] [address], [place], [address] Phone: [phone]   Fax:  [phone]  Physical Therapy Treatment  Patient Details  Name: Jeffrey [name] MRN: [id number] Date of Birth: 08-21-1932 Referring Provider:  Meredith Staggers, MD  Encounter Date: 12/30/2014      PT End of Session - 12/30/14 0945    Visit Number 4   Number of Visits 9   Date for PT Re-Evaluation 01/13/15   PT Start Time 0900   PT Stop Time 0945   PT Time Calculation (min) 45 min   Equipment Utilized During Treatment Gait belt   Activity Tolerance Patient tolerated treatment well   Behavior During Therapy [facility] for tasks assessed/performed      Past Medical History  Diagnosis Date  . Hypertension   . Hypercholesteremia   . Impaired fasting glucose   . Prostate cancer radiation + seeding implant (Dr.[name])  . Rotator cuff tear, right 10/2010    supraspinatous and infraspinatous  . Diabetes mellitus   . Macular degeneration   . Heart murmur     mild aortic stenosis  . Radiation proctitis 11/2012    treated with APC ablation and Canasa suppositories (Dr. Hilarie Fredrickson)  . TIA (transient ischemic attack) 10/02/13    hosp at [facility] x 1 night  . 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 Aortic atherosclerosis 11/25/2014  . Acute encephalopathy 11/27/2014  . RMSF [facility] spotted fever) 10/2014    (hosp with FUO, encephalopathy)  . Multiple lacunar infarcts   . Acute CVA (cerebrovascular accident)   . Acute on chronic combined systolic and diastolic CHF (congestive heart failure)     Past Surgical History  Procedure Laterality Date  . Tonsillectomy and adenoidectomy  age 66  . Prostate seed implant    . Rotator cuff repair  06/16/2011    right (Dr. Berenice Primas)  . Colonoscopy N/A 11/28/2012    Procedure: COLONOSCOPY;  Surgeon: Jerene Bears, MD;  Location: [facility];  Service: Gastroenterology;   Laterality: N/A;    There were no vitals filed for this visit.  Visit Diagnosis:  Abnormality of gait due to impairment of balance      Subjective Assessment - 12/30/14 0858    Subjective Pt reports he is feeling " pretty good" today. Denies falls, denies any pain.   Currently in Pain? No/denies        Objective:  Nu-step L4 x5 min.  Tandem stance 2x1 min each side. LOB on R requiring min A to remain standing.   Narrow stance head turns 2x10, looking up and down 2x10.  amb in clinic 3x50' to assess gait.  balancemaster wt shifting x2 min forward/back, x2 min side to side, repeated for one additional session.  Standing heel raises with single arm support 3x10.  Step ups on 6" step with cuing to minimize UE support.  Throughout session pt became fatigued with notable decr. In balance and tolerance for activity, indicating that this is an area which needs to be focused on.  Pt required cuing and supervision for safety and to progress exercises appropriately.                         PT Education - 12/30/14 0945    Education provided Yes   Education Details educated pt on progression of balance and strengthening exercises, instructed pt in heel raises.   Person(s) Educated Patient  Methods Explanation   Comprehension Verbalized understanding             PT Long Term Goals - 12/16/14 1146    PT LONG TERM GOAL #1   Title Patient will be independent in HEP by 01/15/2015 in order to increase balance and strength to improve safety with gait.    Time 4   Period Weeks   Status New   PT LONG TERM GOAL #2   Title Patient will increase Berg Balance Scale to >52 in order to increase function and reduce fall risk  by 01/15/2015   Time 4   Period Weeks   Status New   PT LONG TERM GOAL #3   Title patient will increase Bilateral LE strength to 5/5 in order reach prior level of function and be able to mow yard without difficulty.  by 01/15/2015   Time 4    Period Weeks   Status New   PT LONG TERM GOAL #4   Title Patient will decrease 5xSTS to less than 15 seconds to improve LE power and decrease fall risk  by 01/15/2015    PT LONG TERM GOAL #5   Title Patient will increase 6 minute walk test distance to at least 1572 in order to increase function within the community and at age matched norms  by 01/15/2015   Time 4   Period Weeks   Status New               Plan - 12/30/14 0946    Clinical Impression Statement Balance problems worsen throughout session possibly due to muscle fatigue/weakness. Pt strongly does not want to return to hospital for additional PT. Continues to have mild muscle weakness and impaired balance and would benefit from continued PT to address this.   Pt will benefit from skilled therapeutic intervention in order to improve on the following deficits Abnormal gait;Decreased activity tolerance;Decreased balance;Decreased endurance;Decreased coordination;Decreased mobility;Decreased strength;Difficulty walking   Rehab Potential Excellent        Problem List Patient Active Problem List   Diagnosis Date Noted  . Paroxysmal atrial fibrillation 12/24/2014  . Stroke with cerebral ischemia 12/03/2014  . Pre-diabetes   . Acute on chronic combined systolic and diastolic CHF (congestive heart failure)   . Acute CVA (cerebrovascular accident)   . Left carotid artery stenosis 11/30/2014  . Lacunar infarct, acute   . Aortic atherosclerosis 11/25/2014  . 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 Awareness alteration, transient 12/26/2013  . Low serum testosterone level 12/25/2013  . TIA (transient ischemic attack) 10/10/2013  . Radiation proctitis 11/28/2012  . Colon cancer screening 11/28/2012  . Murmur 02/23/2011  . Type 2 diabetes mellitus without complication 51/70/0174  . Essential hypertension, benign 11/26/2010  . Impaired fasting glucose 11/26/2010  . Pure  hypercholesterolemia 11/26/2010  . Prostate cancer 11/26/2010    Fisher,Benjamin 12/30/2014, 10:36 AM  [place] [facility] [address], [place], [address] Phone: [phone]   Fax:  [phone]

## 2015-01-01 ENCOUNTER — Ambulatory Visit: Payer: Medicare Other | Admitting: Physical Therapy

## 2015-01-01 DIAGNOSIS — R531 Weakness: Secondary | ICD-10-CM

## 2015-01-01 DIAGNOSIS — R2689 Other abnormalities of gait and mobility: Secondary | ICD-10-CM | POA: Diagnosis not present

## 2015-01-01 NOTE — Therapy (Signed)
[place] [facility] [address] [place], [address] Phone: [phone]   Fax:  [phone]  Physical Therapy Treatment  Patient Details  Name: Jeffrey [name] MRN: [id number] Date of Birth: 03-Jan-1933 Referring Provider:  Meredith Staggers, MD  Encounter Date: 01/01/2015      PT End of Session - 01/01/15 1116    Visit Number 5   Number of Visits 9   Date for PT Re-Evaluation 01/13/15   PT Start Time 1030   PT Stop Time 1115   PT Time Calculation (min) 45 min   Equipment Utilized During Treatment Gait belt   Activity Tolerance Patient tolerated treatment well      Past Medical History  Diagnosis Date  . Hypertension   . Hypercholesteremia   . Impaired fasting glucose   . Prostate cancer radiation + seeding implant (Dr.[name])  . Rotator cuff tear, right 10/2010    supraspinatous and infraspinatous  . Diabetes mellitus   . Macular degeneration   . Heart murmur     mild aortic stenosis  . Radiation proctitis 11/2012    treated with APC ablation and Canasa suppositories (Dr. Hilarie Fredrickson)  . TIA (transient ischemic attack) 10/02/13    hosp at [facility] x 1 night  . 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 Aortic atherosclerosis 11/25/2014  . Acute encephalopathy 11/27/2014  . RMSF [facility] spotted fever) 10/2014    (hosp with FUO, encephalopathy)  . Multiple lacunar infarcts   . Acute CVA (cerebrovascular accident)   . Acute on chronic combined systolic and diastolic CHF (congestive heart failure)     Past Surgical History  Procedure Laterality Date  . Tonsillectomy and adenoidectomy  age 81  . Prostate seed implant    . Rotator cuff repair  06/16/2011    right (Dr. Berenice Primas)  . Colonoscopy N/A 11/28/2012    Procedure: COLONOSCOPY;  Surgeon: Jerene Bears, MD;  [facility];  Service: Gastroenterology;  Laterality: N/A;    There were no vitals filed for this  visit.  Visit Diagnosis:  Weakness      Subjective Assessment - 01/01/15 1035    Subjective Pt reportrs he was extremely sore in his calves following previous session, is feeling better today, no falls, denies pain.   Currently in Pain? No/denies          Objective: Nu-step L3 5 min. Step ups on 6" step 3x1 min. Cuing for pattern, to use LE musculature rather than leaning and using back musculature. Wt shifting 3x2 min with extensive cuing for maintaining upright posture, maintaining natural curve in back and LE. Pt had difficulty shifting onto R but able to do so with manual correction. Partial squat/heel raise 3x20 on each side alternating - pt did require rest break following this. Sit<>stand on raised surface focusing on quick stand, slow controlled sit. 5 min total. Pt able to improve with practice suggesting motor pattern difficulty rather than muscle weakness.  amb in clinic slowly 5x20' with cuing for good control, equal wt shift.                            PT Long Term Goals - 12/16/14 1146    PT LONG TERM GOAL #1   Title Patient will be independent in HEP by 01/15/2015 in order to increase balance and strength to improve safety with gait.    Time 4  Period Weeks   Status New   PT LONG TERM GOAL #2   Title Patient will increase Berg Balance Scale to >52 in order to increase function and reduce fall risk  by 01/15/2015   Time 4   Period Weeks   Status New   PT LONG TERM GOAL #3   Title patient will increase Bilateral LE strength to 5/5 in order reach prior level of function and be able to mow yard without difficulty.  by 01/15/2015   Time 4   Period Weeks   Status New   PT LONG TERM GOAL #4   Title Patient will decrease 5xSTS to less than 15 seconds to improve LE power and decrease fall risk  by 01/15/2015    PT LONG TERM GOAL #5   Title Patient will increase 6 minute walk test distance to at least 1572 in order to increase function within the  community and at age matched norms  by 01/15/2015   Time 4   Period Weeks   Status New               Plan - 01/01/15 1117    Clinical Impression Statement Pt had difficulty with nu-step, requested to avoid this in the future so at next session will focus on walking for improved endurance. Pt has very good control, apprpriate strength, may be appropriate for d/c in the next few visits with appropriate HEP.        Problem List Patient Active Problem List   Diagnosis Date Noted  . Paroxysmal atrial fibrillation 12/24/2014  . Stroke with cerebral ischemia 12/03/2014  . Pre-diabetes   . Acute on chronic combined systolic and diastolic CHF (congestive heart failure)   . Acute CVA (cerebrovascular accident)   . Left carotid artery stenosis 11/30/2014  . Lacunar infarct, acute   . Aortic atherosclerosis 11/25/2014  . 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 Awareness alteration, transient 12/26/2013  . Low serum testosterone level 12/25/2013  . TIA (transient ischemic attack) 10/10/2013  . Radiation proctitis 11/28/2012  . Colon cancer screening 11/28/2012  . Murmur 02/23/2011  . Type 2 diabetes mellitus without complication 67/89/3810  . Essential hypertension, benign 11/26/2010  . Impaired fasting glucose 11/26/2010  . Pure hypercholesterolemia 11/26/2010  . Prostate cancer 11/26/2010    Tanina Barb 01/01/2015, 11:59 AM  [place] [facility] [address], [place], [address] Phone: [phone]   Fax:  [phone]

## 2015-01-07 ENCOUNTER — Ambulatory Visit (INDEPENDENT_AMBULATORY_CARE_PROVIDER_SITE_OTHER): Payer: Medicare Other | Admitting: Cardiovascular Disease

## 2015-01-07 ENCOUNTER — Encounter: Payer: Self-pay | Admitting: Cardiovascular Disease

## 2015-01-07 ENCOUNTER — Ambulatory Visit: Payer: Medicare Other | Admitting: Physical Therapy

## 2015-01-07 ENCOUNTER — Telehealth: Payer: Self-pay | Admitting: Family Medicine

## 2015-01-07 VITALS — BP 150/82 | HR 85 | Ht 71.0 in | Wt 158.0 lb

## 2015-01-07 DIAGNOSIS — I10 Essential (primary) hypertension: Secondary | ICD-10-CM

## 2015-01-07 DIAGNOSIS — I639 Cerebral infarction, unspecified: Secondary | ICD-10-CM | POA: Diagnosis not present

## 2015-01-07 DIAGNOSIS — R0602 Shortness of breath: Secondary | ICD-10-CM

## 2015-01-07 DIAGNOSIS — Z5181 Encounter for therapeutic drug level monitoring: Secondary | ICD-10-CM

## 2015-01-07 DIAGNOSIS — I6522 Occlusion and stenosis of left carotid artery: Secondary | ICD-10-CM

## 2015-01-07 DIAGNOSIS — R2689 Other abnormalities of gait and mobility: Secondary | ICD-10-CM | POA: Diagnosis not present

## 2015-01-07 DIAGNOSIS — I429 Cardiomyopathy, unspecified: Secondary | ICD-10-CM

## 2015-01-07 DIAGNOSIS — R531 Weakness: Secondary | ICD-10-CM | POA: Diagnosis not present

## 2015-01-07 DIAGNOSIS — I480 Paroxysmal atrial fibrillation: Secondary | ICD-10-CM | POA: Diagnosis not present

## 2015-01-07 NOTE — Telephone Encounter (Signed)
Called pt and explained that he could get labs at his cardio appt today as well as gave him message from Dr Tomi Bamberger  about med and previous test. They will see if cardio will will do lab today and will let us know if we should cancel lab appt for Friday.

## 2015-01-07 NOTE — Therapy (Signed)
[place] [address] [place], [address] Phone: [phone]   Fax:  [phone]  Physical Therapy Treatment  Patient Details  Name: Jeffrey [name] MRN: [id number] Date of Birth: 31-Oct-1932 Referring Provider:  Meredith Staggers, MD  Encounter Date: 01/07/2015      PT End of Session - 01/07/15 1151    Visit Number 6   Number of Visits 9   Date for PT Re-Evaluation 01/13/15   Authorization Type gcode   Authorization Time Period 10    PT Start Time 1115   PT Stop Time 1140   PT Time Calculation (min) 25 min   Activity Tolerance Patient tolerated treatment well      Past Medical History  Diagnosis Date  . Hypertension   . Hypercholesteremia   . Impaired fasting glucose   . Prostate cancer radiation + seeding implant (Dr.[name])  . Rotator cuff tear, right 10/2010    supraspinatous and infraspinatous  . Diabetes mellitus   . Macular degeneration   . Heart murmur     mild aortic stenosis  . Radiation proctitis 11/2012    treated with APC ablation and Canasa suppositories (Dr. Hilarie Fredrickson)  . TIA (transient ischemic attack) 10/02/13    hosp at [facility] x 1 night  . 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 Aortic atherosclerosis 11/25/2014  . Acute encephalopathy 11/27/2014  . RMSF [facility] spotted fever) 10/2014    (hosp with FUO, encephalopathy)  . Multiple lacunar infarcts   . Acute CVA (cerebrovascular accident)   . Acute on chronic combined systolic and diastolic CHF (congestive heart failure)     Past Surgical History  Procedure Laterality Date  . Tonsillectomy and adenoidectomy  age 57  . Prostate seed implant    . Rotator cuff repair  06/16/2011    right (Dr. Berenice Primas)  . Colonoscopy N/A 11/28/2012    Procedure: COLONOSCOPY;  Surgeon: Jerene Bears, MD;  [facility];  Service: Gastroenterology;  Laterality: N/A;    There were no vitals filed for  this visit.  Visit Diagnosis:  Weakness      Subjective Assessment - 01/07/15 1148    Subjective Pt reports no difficulty - currently he feels he is ready for d/c   Currently in Pain? No/denies       Objective: Reviewed pt HEP, encouraged pt to continue to focus on higher level balance activities safely such as SLS, tandem stance, same with head turns.  6MWT, 5x sit<>stand, BERG, 1602' for 6MWT with no LOB or difficulty, BERG 53/56, difficulty with SLS and tandem stance, 5xsit<>stand 10 sec. peformed for multiple sets. PT educated pt on continuing with activity and answered pt questions regarding progression following d/c from PT.                          PT Education - 01/07/15 1149    Education provided Yes   Education Details educated pt on continuing with HEP at home   Person(s) Educated Patient   Methods Explanation   Comprehension Verbalized understanding             PT Long Term Goals - 01/07/15 1145    PT LONG TERM GOAL #1   Title Patient will be independent in HEP by 01/15/2015 in order to increase balance and strength to improve safety with gait.    Time 4   Period Weeks  Status Achieved   PT LONG TERM GOAL #2   Title Patient will increase Berg Balance Scale to >52 in order to increase function and reduce fall risk  by 01/15/2015   Time 4   Period Weeks   Status Achieved   PT LONG TERM GOAL #3   Title patient will increase Bilateral LE strength to 5/5 in order reach prior level of function and be able to mow yard without difficulty.  by 01/15/2015   Time 4   Period Weeks   Status Achieved   PT LONG TERM GOAL #4   Title Patient will decrease 5xSTS to less than 15 seconds to improve LE power and decrease fall risk  by 01/15/2015    Status Achieved   PT LONG TERM GOAL #5   Title Patient will increase 6 minute walk test distance to at least 1572 in order to increase function within the community and at age matched norms  by 01/15/2015    Time 4   Period Weeks   Status Achieved               Plan - February 04, 2015 1152    Clinical Impression Statement Pt has made significant improvement in 6MWT to greater than normative value, improvement in 5x sit<>stand indicating decr. risk of falls, improvement in Berg score to 53/56. Pt verbalized his agreement that he is ready to continue to strengthen on his own. Pt does have some lateral weakness but he is unsure if this is something he had prior to his issues.   Pt will benefit from skilled therapeutic intervention in order to improve on the following deficits Abnormal gait;Decreased activity tolerance;Decreased balance;Decreased endurance;Decreased coordination;Decreased mobility;Decreased strength;Difficulty walking   Rehab Potential Excellent   Clinical Impairments Affecting Rehab Potential positive: good family support, High baseline. Negative: co-morbidities.    PT Frequency 2x / week   PT Duration 4 weeks   PT Treatment/Interventions ADLs/Self Care Home Management;Cryotherapy;Moist Heat;Gait training;Stair training;Functional mobility training;Therapeutic activities;Therapeutic exercise;Balance training;Neuromuscular re-education;Patient/family education          G-Codes - 02-04-2015 1155    Functional Assessment Tool Used Merrilee Jansky, 6MWT, 5x sit<>stand   Functional Limitation Mobility: Walking and moving around   Mobility: Walking and Moving Around Current Status [phone]) At least 1 percent but less than 20 percent impaired, limited or restricted   Mobility: Walking and Moving Around Discharge Status [phone]) At least 1 percent but less than 20 percent impaired, limited or restricted      Problem List Patient Active Problem List   Diagnosis Date Noted  . Paroxysmal atrial fibrillation 12/24/2014  . Stroke with cerebral ischemia 12/03/2014  . Pre-diabetes   . Acute on chronic combined systolic and diastolic CHF (congestive heart failure)   . Acute CVA (cerebrovascular accident)    . Left carotid artery stenosis 11/30/2014  . Lacunar infarct, acute   . Aortic atherosclerosis 11/25/2014  . 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 Awareness alteration, transient 12/26/2013  . Low serum testosterone level 12/25/2013  . TIA (transient ischemic attack) 10/10/2013  . Radiation proctitis 11/28/2012  . Colon cancer screening 11/28/2012  . Murmur 02/23/2011  . Type 2 diabetes mellitus without complication 32/67/1245  . Essential hypertension, benign 11/26/2010  . Impaired fasting glucose 11/26/2010  . Pure hypercholesterolemia 11/26/2010  . Prostate cancer 11/26/2010    Fisher,Benjamin 02-04-2015, 11:56 AM  [place] [facility] [address], [place], [address] Phone: [phone]  Fax:  [phone]

## 2015-01-07 NOTE — Patient Instructions (Addendum)
Medication Instructions:  Your physician recommends that you continue on your current medications as directed. Please refer to the Current Medication list given to you today.   Labwork: none  Testing/Procedures: Your physician has requested that you have a lexiscan myoview. For further information please visit [email or web address]. Please follow instruction sheet, as given.  [place]  Your caregiver has ordered a Stress Test with nuclear imaging. The purpose of this test is to evaluate the blood supply to your heart muscle. This procedure is referred to as a "Non-Invasive Stress Test." This is because other than having an IV started in your vein, nothing is inserted or "invades" your body. Cardiac stress tests are done to find areas of poor blood flow to the heart by determining the extent of coronary artery disease (CAD). Some patients exercise on a treadmill, which naturally increases the blood flow to your heart, while others who are  unable to walk on a treadmill due to physical limitations have a pharmacologic/chemical stress agent called Lexiscan . This medicine will mimic walking on a treadmill by temporarily increasing your coronary blood flow.   Please note: these test may take anywhere between 2-4 hours to complete  PLEASE REPORT TO [place] AT THE FIRST DESK WILL DIRECT YOU WHERE TO GO  Date of Procedure: Tuesday, August 16, 7:30am Arrival Time for Procedure: 7:00am  Instructions regarding medication:    xx ____:  Hold METOPROLOL the night before procedure and morning of procedure  _xx___:  Hold other medications as follows:________Lasix_________________________________________________________________________________________________________________________________________________________________________________________________________________________________________________________________________________  PLEASE NOTIFY THE OFFICE AT LEAST  24 HOURS IN ADVANCE IF YOU ARE UNABLE TO KEEP YOUR APPOINTMENT.  [phone] AND  PLEASE NOTIFY NUCLEAR MEDICINE AT [facility] AT LEAST 24 HOURS IN ADVANCE IF YOU ARE UNABLE TO KEEP YOUR APPOINTMENT. [phone]  How to prepare for your Myoview test:   Do not eat or drink after midnight  No caffeine for 24 hours prior to test  No smoking 24 hours prior to test.  Your medication may be taken with water.  If your doctor stopped a medication because of this test, do not take that medication.  Ladies, please do not wear dresses.  Skirts or pants are appropriate. Please wear a short sleeve shirt.  No perfume, cologne or lotion.  Wear comfortable walking shoes. No heels!            Follow-Up: Your physician recommends that you schedule a follow-up appointment in: two months with [name].    Any Other Special Instructions Will Be Listed Below (If Applicable).  Nuclear Medicine Exam A nuclear medicine exam is a safe and painless imaging test. It helps to detect and diagnose disease in the body as well as provide information about organ function and structure.  Nuclear scans are most often done of the:  Lungs.  Heart.  Thyroid gland.  Bones.  Abdomen. HOW A NUCLEAR MEDICINE EXAM WORKS A nuclear medicine exam works by using a radioactive tracer. The material is given either by an IV (intravenous) injection or it may be swallowed. After the tracer is in the body, it is absorbed by your body's organs. A large scanning machine that uses a special camera detects the radioactivity in your body. A computerized image is then formed regarding the area of concern. The small amounts of radioactive material used in a nuclear medicine exam are found to be medically safe. However, because radioactive material is used, this test is not done if you are pregnant or  nursing.  BEFORE THE PROCEDURE  If available, bring previous imaging studies such as x-rays, etc. with you to the exam.  Arrive  early for your exam. PROCEDURE  An IV may be started before the exam begins.  Depending on the type of examination, will lie on a table or sit in a chair during the exam.  The nuclear medicine exam will take about 30 to 60 minutes to complete. AFTER THE PROCEDURE  After your scan is completed, the image(s) will be evaluated by a specialist. It is important that you follow up with your caregiver to find out your test results.  You may return to your regular activity as instructed by your caregiver. SEEK IMMEDIATE MEDICAL CARE IF: You have shortness of breath or difficulty breathing. MAKE SURE YOU:   Understand these instructions.  Will watch your condition.  Will get help right away if you are not doing well or get worse. Document Released: 06/24/2004 Document Revised: 08/09/2011 Document Reviewed: 08/08/2008 [facility] Patient Information 2015 [place], [place]. This information is not intended to replace advice given to you by your health care provider. Make sure you discuss any questions you have with your health care provider.

## 2015-01-07 NOTE — Telephone Encounter (Signed)
Pt states that he was asked to return for a repeat potassium lab and they are going out of town next week so [place] told him it was ok to come in this week. Pt is coming in on Friday for this lab. Can you enter orders if this is ok?

## 2015-01-07 NOTE — Telephone Encounter (Signed)
I was hoping he might be able to get done in [place], since he lives there.  He has an appointment this afternoon with cardiologist in [place].  If he orders any labs, then he won't need to come on Friday (wondering if he can ask for b-met to be done there?)  Also--his liver tests had come back normal on last check.  I believe that I forgot to tell him that he can safely restart the lovastatin (his cholesterol medication that was stopped when his liver tests were abnormal in the hospital).  He may resume taking the lovastatin, if he hasn't already done so.  I went ahead and entered b-met order, but to save them the long drive, perhaps they can look into getting it done today instead.  We can fax order to lab at [facility], if needed

## 2015-01-09 ENCOUNTER — Ambulatory Visit: Payer: Medicare Other | Admitting: Physical Therapy

## 2015-01-09 DIAGNOSIS — I429 Cardiomyopathy, unspecified: Secondary | ICD-10-CM | POA: Insufficient documentation

## 2015-01-09 NOTE — Progress Notes (Signed)
Primary care physician: Dr. Tomi Bamberger  HPI  This is an 81 year old male who is here today for a follow-up visit after a prolonged hospitalization. He has known history of hypertension, hyperlipidemia, TIA, aortic stenosis, and possibly diabetes mellitus (although not on hypoglycemic agents, HbA1C 6.3% on 7/1). He was admitted on 6/29 with a fever, altered mental status, and dysarthria with possible right sided weakness. MRI brain on 6/30 showed acute nonhemorrhagic white matter infarct of the anterior right frontal lobe and remote lacunar infarcts of the anterior left coronal radiata. Carotid Dopplers showed left 80-99% ICA stenosis.  He was found to have [facility] Spotted Fever and was treated with doxycycline.  He also developed a paroxysm of atrial fibrillation with mildly elevated troponin.  Echocardiogram on 02/23/11 showed normal LV systolic function, EF 85-27%, mild LVH, grade I diastolic dysfunction, moderately calcified leaflets with mild aortic stenosis (mean gradient 12 mmHg, and normal RV systolic function.  Echocardiogram on 11/28/14 showed mild to moderately reduced LV systolic function, EF 78-24%, diffuse hypokinesis with severe apical hypokinesis, grade I diastolic dysfunction, moderately thickened and calcified aortic valve leaflets with mild stenosis, mild mitral regurgitation, mild left atrial dilatation, mildly reduced RV systolic function, mild pulmonary hypertension, with no definitive vegetations seen. The patient was noted to be mostly in sinus rhythm with frequent PACs. However, brief paroxysms of atrial fibrillation were also noted. He was treated with rate control. Anticoagulation was suggested but not initiated. The patient will be evaluated for left carotid endarterectomy. An outpatient stress test was recommended. The patient attended last day of inpatient rehabilitation today. He feels much stronger. He denies any chest pain. Shortness of breath is stable. He wants to  resume driving.  No Known Allergies   Current Outpatient Prescriptions on File Prior to Visit  Medication Sig Dispense Refill  . acetaminophen (TYLENOL ARTHRITIS PAIN) 650 MG CR tablet Take 1 tablet (650 mg total) by mouth every 8 (eight) hours as needed. (Patient taking differently: Take 650 mg by mouth 2 (two) times daily. )    . aspirin 81 MG tablet Take 81 mg by mouth 2 (two) times daily.     . furosemide (LASIX) 20 MG tablet Take 1 tablet (20 mg total) by mouth daily. 30 tablet 1  . hydrocerin (EUCERIN) CREA Apply 1 application topically 2 (two) times daily.  0  . hydrocortisone cream 1 % Apply 1 application topically as needed (for ankles).    [name] lisinopril (PRINIVIL,ZESTRIL) 20 MG tablet Take 1 tablet (20 mg total) by mouth daily. 30 tablet 1  . metoprolol succinate (TOPROL-XL) 50 MG 24 hr tablet Take 1 tablet (50 mg total) by mouth 2 (two) times daily with a meal. Take with or immediately following a meal. 60 tablet 1  . Multiple Vitamins-Minerals (PRESERVISION AREDS) CAPS Take 1 capsule by mouth every morning.    . potassium chloride (K-DUR,KLOR-CON) 10 MEQ tablet Take 1 tablet (10 mEq total) by mouth 2 (two) times daily. 30 tablet 1   No current facility-administered medications on file prior to visit.     Past Medical History  Diagnosis Date  . Hypertension   . Hypercholesteremia   . Impaired fasting glucose   . Prostate cancer radiation + seeding implant (Dr.[name])  . Rotator cuff tear, right 10/2010    supraspinatous and infraspinatous  . Diabetes mellitus   . Macular degeneration   . Heart murmur     mild aortic stenosis  . Radiation proctitis 11/2012    treated with APC  ablation and Canasa suppositories (Dr. Hilarie Fredrickson)  . TIA (transient ischemic attack) 10/02/13    hosp at [facility] x 1 night  . 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 Aortic atherosclerosis 11/25/2014  . Acute encephalopathy 11/27/2014  . RMSF [facility] spotted  fever) 10/2014    (hosp with FUO, encephalopathy)  . Multiple lacunar infarcts   . Acute CVA (cerebrovascular accident)   . Acute on chronic combined systolic and diastolic CHF (congestive heart failure)      Past Surgical History  Procedure Laterality Date  . Tonsillectomy and adenoidectomy  age 77  . Prostate seed implant    . Rotator cuff repair  06/16/2011    right (Dr. Berenice Primas)  . Colonoscopy N/A 11/28/2012    Procedure: COLONOSCOPY;  Surgeon: Jerene Bears, MD;  Location: [facility];  Service: Gastroenterology;  Laterality: N/A;     Family History  Problem Relation Age of Onset  . Heart disease Father 47    Died suddenly.  No diagnosis  . Stroke Sister   . Dementia Mother   . Diabetes Mother   . Cancer Daughter     ovarian (?) vs other male cancer; s/p hyst doing well  . Diabetes Daughter     GDM, and now AODM  . Heart disease Son     congestive heart failure--improved     Social History   Social History  . Marital Status: Married    Spouse Name: N/A  . Number of Children: 4  . Years of Education: N/A   Occupational History  . Retired    Social History Main Topics  . Smoking status: Former Smoker -- 1.00 packs/day for 20 years    Types: Cigarettes    Quit date: 05/31/1980  . Smokeless tobacco: Never Used  . Alcohol Use: 1.2 oz/week    2 Cans of beer per week     Comment: around 2 beers daily (occasionally a third) as max since hospitalizaton 5/15.  . Drug Use: No  . Sexual Activity:    Partners: Female   Other Topics Concern  . Not on file   Social History Narrative   Retired Engineer, structural.  2 sons, 2 daughters (all in [place]), 15 grandchildren, 1 great grandchild     ROS A 10 point review of system was performed. It is negative other than that mentioned in the history of present illness.   PHYSICAL EXAM   BP 150/82 mmHg  Pulse 85  Ht 5\' 11"  (1.803 m)  Wt 158 lb (71.668 kg)  BMI 22.05 kg/m2 Constitutional: He is oriented to person, place,  and time. He appears well-developed and well-nourished. No distress.  HENT: No nasal discharge.  Head: Normocephalic and atraumatic.  Eyes: Pupils are equal and round.  No discharge. Neck: Normal range of motion. Neck supple. No JVD present. No thyromegaly present. There is left carotid bruit. Cardiovascular: Normal rate, regular rhythm with premature beats, normal heart sounds. Exam reveals no gallop and no friction rub. There is a 2/6 systolic ejection murmur in the aortic area.  Pulmonary/Chest: Effort normal and breath sounds normal. No stridor. No respiratory distress. He has no wheezes. He has no rales. He exhibits no tenderness.  Abdominal: Soft. Bowel sounds are normal. He exhibits no distension. There is no tenderness. There is no rebound and no guarding.  Musculoskeletal: Normal range of motion. He exhibits no edema and no tenderness.  Neurological: He is alert and oriented to person, place, and time.  Coordination normal.  Skin: Skin is warm and dry. No rash noted. He is not diaphoretic. No erythema. No pallor.  Psychiatric: He has a normal mood and affect. His behavior is normal. Judgment and thought content normal.       VDI:XVEZB  Rhythm  - occasional ectopic ventricular beat    -  Nonspecific T-abnormality.   ABNORMAL     ASSESSMENT AND PLAN

## 2015-01-09 NOTE — Assessment & Plan Note (Signed)
The patient is scheduled to see vascular surgery for evaluation of left carotid endarterectomy.

## 2015-01-09 NOTE — Assessment & Plan Note (Signed)
The patient had recent cardiomyopathy with mildly reduced LV systolic function in the setting of acute illness. His troponin was mildly elevated as well. Due to all of that, I requested a pharmacologic nuclear stress test.

## 2015-01-09 NOTE — Assessment & Plan Note (Signed)
The patient had a 30 day outpatient telemetry. I reviewed the rhythm strips. This mostly showed sinus rhythm with frequent premature beats. However, there was brief episodes where I could not exclude atrial fibrillation. This was also the case during his hospitalization. CHADS VASc score is high enough to warrant anticoagulation. I don't see a contraindication for anticoagulation but I'm not entirely clear why this was not initiated. He is going to follow-up with neurology later this month as well. The patient most likely will require left carotid endarterectomy. I will readdress the issue of anticoagulation upon follow-up.

## 2015-01-10 ENCOUNTER — Encounter: Payer: Medicare Other | Admitting: Physician Assistant

## 2015-01-10 ENCOUNTER — Other Ambulatory Visit: Payer: Medicare Other

## 2015-01-10 ENCOUNTER — Ambulatory Visit: Payer: Medicare Other | Admitting: Physical Therapy

## 2015-01-10 DIAGNOSIS — C61 Malignant neoplasm of prostate: Secondary | ICD-10-CM | POA: Diagnosis not present

## 2015-01-10 DIAGNOSIS — Z5181 Encounter for therapeutic drug level monitoring: Secondary | ICD-10-CM

## 2015-01-10 LAB — BASIC METABOLIC PANEL
BUN: 17 mg/dL (ref 7–25)
CHLORIDE: 102 mmol/L (ref 98–110)
CO2: 27 mmol/L (ref 20–31)
Calcium: 9.5 mg/dL (ref 8.6–10.3)
Creat: 0.75 mg/dL (ref 0.70–1.11)
Glucose, Bld: 123 mg/dL — ABNORMAL HIGH (ref 65–99)
POTASSIUM: 5 mmol/L (ref 3.5–5.3)
SODIUM: 141 mmol/L (ref 135–146)

## 2015-01-12 ENCOUNTER — Encounter: Payer: Self-pay | Admitting: Family Medicine

## 2015-01-14 ENCOUNTER — Ambulatory Visit
Admission: RE | Admit: 2015-01-14 | Discharge: 2015-01-14 | Disposition: A | Discharge status: Home / Self Care | Payer: Medicare Other | Source: Ambulatory Visit | Attending: Cardiovascular Disease | Admitting: Cardiovascular Disease

## 2015-01-14 DIAGNOSIS — R0602 Shortness of breath: Secondary | ICD-10-CM | POA: Insufficient documentation

## 2015-01-14 MED ORDER — TECHNETIUM TC 99M SESTAMIBI - CARDIOLITE
30.0000 | Freq: Once | INTRAVENOUS | Status: AC | PRN
  Administered 2015-01-14: 09:00:00 31.31 via INTRAVENOUS

## 2015-01-14 MED ORDER — REGADENOSON 0.4 MG/5ML IV SOLN
0.4000 mg | Freq: Once | INTRAVENOUS | Status: AC
  Administered 2015-01-14: 0.4 mg via INTRAVENOUS
  Filled 2015-01-14: qty 5

## 2015-01-14 MED ORDER — TECHNETIUM TC 99M SESTAMIBI - CARDIOLITE
10.0000 | Freq: Once | INTRAVENOUS | Status: AC | PRN
  Administered 2015-01-14: 12.77 via INTRAVENOUS

## 2015-01-15 ENCOUNTER — Encounter: Payer: Self-pay | Admitting: Vascular Surgery

## 2015-01-16 ENCOUNTER — Telehealth: Payer: Self-pay | Admitting: *Deleted

## 2015-01-16 ENCOUNTER — Ambulatory Visit (INDEPENDENT_AMBULATORY_CARE_PROVIDER_SITE_OTHER): Payer: Medicare Other | Admitting: Vascular Surgery

## 2015-01-16 ENCOUNTER — Encounter: Payer: Self-pay | Admitting: Vascular Surgery

## 2015-01-16 ENCOUNTER — Ambulatory Visit (HOSPITAL_COMMUNITY)
Admission: RE | Admit: 2015-01-16 | Discharge: 2015-01-16 | Disposition: A | Discharge status: Home / Self Care | Payer: Medicare Other | Source: Ambulatory Visit | Attending: Vascular Surgery | Admitting: Vascular Surgery

## 2015-01-16 VITALS — BP 175/84 | HR 84 | Ht 71.0 in | Wt 157.4 lb

## 2015-01-16 DIAGNOSIS — I6522 Occlusion and stenosis of left carotid artery: Secondary | ICD-10-CM | POA: Diagnosis not present

## 2015-01-16 DIAGNOSIS — I639 Cerebral infarction, unspecified: Secondary | ICD-10-CM

## 2015-01-16 NOTE — Telephone Encounter (Signed)
Patient called wanting stress results and when can he resume driving? Thanks

## 2015-01-16 NOTE — Progress Notes (Signed)
[facility] [place] HISTORY AND PHYSICAL   History of Present Illness:  Patient is a 81 y.o. year old male who presents for evaluation of asymptomatic left internal carotid artery stenosis. The patient was noted to have a high-grade left internal carotid artery stenosis on carotid duplex exam that was performed during the course of a septic workup during a hospitalization in July of this year. The patient was quite ill requiring ventilator support with sepsis and possible [facility] spotted fever. He states he has recovered from this but still overall feels very weak and has had some weight loss. He has decreased energy overall.  He denies any symptoms of TIA amaurosis or stroke. He has had no localizing neurologic deficits. He is on aspirin. MRI scan at the time of his hospital admission showed a remote left lacunar infarcts as well as an acute right-sided infarct. He also has been undergoing workup for arrhythmia. He sees [name] for this. After Fletcher Anon is currently considering starting him on anticoagulation for paroxysmal atrial fibrillation..  Other medical problems include hypertension, elevated cholesterol, diabetes all of which are currently stable.   Past Medical History  Diagnosis Date  . Hypertension   . Hypercholesteremia   . Impaired fasting glucose   . Rotator cuff tear, right 10/2010    supraspinatous and infraspinatous  . Diabetes mellitus   . Macular degeneration   . Heart murmur     mild aortic stenosis  . Radiation proctitis 11/2012    treated with APC ablation and Canasa suppositories (Dr. Hilarie Fredrickson)  . TIA (transient ischemic attack) 10/02/13    hosp at [facility] x 1 night  . 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 Aortic atherosclerosis 11/25/2014  . Acute encephalopathy 11/27/2014  . [facility] spotted fever) 10/2014    (hosp with FUO, encephalopathy)  . Multiple lacunar infarcts   . Acute CVA (cerebrovascular  accident)   . Prostate cancer radiation + seeding implant (Dr.[name])  . Acute on chronic combined systolic and diastolic CHF (congestive heart failure)   . Atrial fibrillation     Past Surgical History  Procedure Laterality Date  . Tonsillectomy and adenoidectomy  age 89  . Prostate seed implant    . Rotator cuff repair  06/16/2011    right (Dr. Berenice Primas)  . Colonoscopy N/A 11/28/2012    Procedure: COLONOSCOPY;  Surgeon: Jerene Bears, MD;  Location: [facility];  Service: Gastroenterology;  Laterality: N/A;    Social History Social History  Substance Use Topics  . Smoking status: Former Smoker -- 1.00 packs/day for 20 years    Types: Cigarettes    Quit date: 05/31/1980  . Smokeless tobacco: Never Used  . Alcohol Use: 4.2 oz/week    7 Cans of beer per week     Comment: around 2 beers daily (occasionally a third) as max since hospitalizaton 5/15.    Family History Family History  Problem Relation Age of Onset  . Heart disease Father 32    Died suddenly.  No diagnosis  . Stroke Sister   . Dementia Mother   . Diabetes Mother   . Heart disease Mother   . Hypertension Mother   . Cancer Daughter     ovarian (?) vs other male cancer; s/p hyst doing well  . Diabetes Daughter     GDM, and now AODM  . Heart disease Son     congestive heart failure--improved    Allergies  No Known  Allergies   Current Outpatient Prescriptions  Medication Sig Dispense Refill  . aspirin 81 MG tablet Take 81 mg by mouth 2 (two) times daily.     . furosemide (LASIX) 20 MG tablet Take 1 tablet (20 mg total) by mouth daily. 30 tablet 1  . hydrocerin (EUCERIN) CREA Apply 1 application topically 2 (two) times daily.  0  . hydrocortisone cream 1 % Apply 1 application topically as needed (for ankles).    [name] lisinopril (PRINIVIL,ZESTRIL) 20 MG tablet Take 1 tablet (20 mg total) by mouth daily. 30 tablet 1  . metoprolol succinate (TOPROL-XL) 50 MG 24 hr tablet Take 1 tablet (50 mg total) by mouth 2 (two)  times daily with a meal. Take with or immediately following a meal. 60 tablet 1  . Multiple Vitamins-Minerals (PRESERVISION AREDS) CAPS Take 1 capsule by mouth every morning.    . potassium chloride (K-DUR,KLOR-CON) 10 MEQ tablet Take 1 tablet (10 mEq total) by mouth 2 (two) times daily. 30 tablet 1  . acetaminophen (TYLENOL ARTHRITIS PAIN) 650 MG CR tablet Take 1 tablet (650 mg total) by mouth every 8 (eight) hours as needed. (Patient not taking: Reported on 01/16/2015)     No current facility-administered medications for this visit.    ROS:   General:  + weight loss, no Fever, no chills  HEENT: No recent headaches, no nasal bleeding, no visual changes, no sore throat  Neurologic: No dizziness, blackouts, seizures. No recent symptoms of stroke or mini- stroke. No recent episodes of slurred speech, or temporary blindness.  Cardiac: No recent episodes of chest pain/pressure, no shortness of breath at rest.  No shortness of breath with exertion.  Denies history of atrial fibrillation or irregular heartbeat  Vascular: No history of rest pain in feet.  No history of claudication.  No history of non-healing ulcer, No history of DVT   Pulmonary: No home oxygen, no productive cough, no hemoptysis,  No asthma or wheezing  Musculoskeletal:  [ ]  Arthritis, [ ]  Low back pain,  [ ]  Joint pain  Hematologic:No history of hypercoagulable state.  No history of easy bleeding.  No history of anemia  Gastrointestinal: No hematochezia or melena,  No gastroesophageal reflux, no trouble swallowing  Urinary: [ ]  chronic Kidney disease, [ ]  on HD - [ ]  MWF or [ ]  TTHS, [ ]  Burning with urination, [ ]  Frequent urination, [ ]  Difficulty urinating;   Skin: No rashes  Psychological: No history of anxiety,  No history of depression   Physical Examination  Filed Vitals:   01/16/15 1045 01/16/15 1107  BP: 149/87 175/84  Pulse: 84   Height: 5\' 11"  (1.803 m)   Weight: 157 lb 6.4 oz (71.396 kg)   SpO2: 98%      Body mass index is 21.96 kg/(m^2).  General:  Alert and oriented, no acute distress HEENT: Normal Neck: No bruit or JVD Pulmonary: Clear to auscultation bilaterally Cardiac: Regular Rate and Rhythm without murmur Abdomen: Soft, non-tender, non-distended, no mass, no scars Skin: No rash Extremity Pulses:  2+ radial, brachial, femoral pulses bilaterally Musculoskeletal: No deformity or edema  Neurologic: Upper and lower extremity motor 5/5 and symmetric  DATA:  We repeated his carotid duplex exam in our office today. I reviewed and interpreted this study. Velocities were 313/70 on the left side. Previous velocities in the hospital were 450/132.   ASSESSMENT:  Left side 60-80% internal carotid artery stenosis currently asymptomatic. The patient overall is very deconditioned. In light of his  recent complicated hospital stay and overall deconditioning a believe the best option for now is further observation. If the patient developed any symptoms of TIA amaurosis or stroke we would consider left carotid endarterectomy. Otherwise I will see him back in follow-up in 3 months and repeat his carotid duplex exam. If his velocities continued to increase or if he is more than 80% we would consider carotid endarterectomy. Hopefully he will have returned to his normal state of overall condition by that point. The patient will continue his daily aspirin.   PLAN:  See above  Ruta Hinds, MD [facility] [place] Office: [phone] Pager: [phone]

## 2015-01-16 NOTE — Telephone Encounter (Signed)
Spoke with pt and told him we would call him once test reviewed by [name]. Also told pt I would send to [name] to determine when pt can resume driving

## 2015-01-17 NOTE — Telephone Encounter (Signed)
S/w pt regarding stress test results and Dr. Tyrell Antonio recommendation.. Pt verbalized understanding with no further questions.

## 2015-01-17 NOTE — Telephone Encounter (Signed)
His stress test was normal. He can drive from a cardiac standpoint but he has to take clearance from his neurologist.

## 2015-01-17 NOTE — Addendum Note (Signed)
Addended by: Mena Goes on: 01/17/2015 05:17 PM   Modules accepted: Orders

## 2015-01-29 ENCOUNTER — Ambulatory Visit (INDEPENDENT_AMBULATORY_CARE_PROVIDER_SITE_OTHER): Payer: Medicare Other | Admitting: Neurology

## 2015-01-29 ENCOUNTER — Encounter: Payer: Self-pay | Admitting: Neurology

## 2015-01-29 VITALS — BP 160/85 | HR 70 | Ht 71.0 in | Wt 160.0 lb

## 2015-01-29 DIAGNOSIS — IMO0001 Reserved for inherently not codable concepts without codable children: Secondary | ICD-10-CM

## 2015-01-29 DIAGNOSIS — I639 Cerebral infarction, unspecified: Secondary | ICD-10-CM

## 2015-01-29 DIAGNOSIS — R404 Transient alteration of awareness: Secondary | ICD-10-CM | POA: Diagnosis not present

## 2015-01-29 NOTE — Patient Instructions (Signed)
I had a long d/w patient and wife about his recent stroke, risk for recurrent stroke/TIAs, personally independently reviewed imaging studies and stroke evaluation results and answered questions.Continue aspirin 81 mg orally every day  for secondary stroke prevention and maintain strict control of hypertension with blood pressure goal below 130/90, diabetes with hemoglobin A1c goal below 6.5% and lipids with LDL cholesterol goal below 70 mg/dL. I also advised the patient to eat a healthy diet with plenty of whole grains, cereals, fruits and vegetables, exercise regularly and maintain ideal body weight .he was advised to continue follow-up with Dr. Ruta Hinds for his left carotid stenosis for revascularization consideration. Also check EEG for seizure activity given his history of episodes of transient altered awareness and staring spells. Followup in the future with me in 6 months or call earlier if necessary. Stroke Prevention Some medical conditions and behaviors are associated with an increased chance of having a stroke. You may prevent a stroke by making healthy choices and managing medical conditions. HOW CAN I REDUCE MY RISK OF HAVING A STROKE?   Stay physically active. Get at least 30 minutes of activity on most or all days.  Do not smoke. It may also be helpful to avoid exposure to secondhand smoke.  Limit alcohol use. Moderate alcohol use is considered to be:  No more than 2 drinks per day for men.  No more than 1 drink per day for nonpregnant women.  Eat healthy foods. This involves:  Eating 5 or more servings of fruits and vegetables a day.  Making dietary changes that address high blood pressure (hypertension), high cholesterol, diabetes, or obesity.  Manage your cholesterol levels.  Making food choices that are high in fiber and low in saturated fat, trans fat, and cholesterol may control cholesterol levels.  Take any prescribed medicines to control cholesterol as directed by  your health care provider.  Manage your diabetes.  Controlling your carbohydrate and sugar intake is recommended to manage diabetes.  Take any prescribed medicines to control diabetes as directed by your health care provider.  Control your hypertension.  Making food choices that are low in salt (sodium), saturated fat, trans fat, and cholesterol is recommended to manage hypertension.  Take any prescribed medicines to control hypertension as directed by your health care provider.  Maintain a healthy weight.  Reducing calorie intake and making food choices that are low in sodium, saturated fat, trans fat, and cholesterol are recommended to manage weight.  Stop drug abuse.  Avoid taking birth control pills.  Talk to your health care provider about the risks of taking birth control pills if you are over 87 years old, smoke, get migraines, or have ever had a blood clot.  Get evaluated for sleep disorders (sleep apnea).  Talk to your health care provider about getting a sleep evaluation if you snore a lot or have excessive sleepiness.  Take medicines only as directed by your health care provider.  For some people, aspirin or blood thinners (anticoagulants) are helpful in reducing the risk of forming abnormal blood clots that can lead to stroke. If you have the irregular heart rhythm of atrial fibrillation, you should be on a blood thinner unless there is a good reason you cannot take them.  Understand all your medicine instructions.  Make sure that other conditions (such as anemia or atherosclerosis) are addressed. SEEK IMMEDIATE MEDICAL CARE IF:   You have sudden weakness or numbness of the face, arm, or leg, especially on one side of  the body.  Your face or eyelid droops to one side.  You have sudden confusion.  You have trouble speaking (aphasia) or understanding.  You have sudden trouble seeing in one or both eyes.  You have sudden trouble walking.  You have  dizziness.  You have a loss of balance or coordination.  You have a sudden, severe headache with no known cause.  You have new chest pain or an irregular heartbeat. Any of these symptoms may represent a serious problem that is an emergency. Do not wait to see if the symptoms will go away. Get medical help at once. Call your local emergency services (911 in [place] Do not drive yourself to the hospital. Document Released: 06/24/2004 Document Revised: 10/01/2013 Document Reviewed: 11/17/2012 [facility] Patient Information 2015 [place], [place]. This information is not intended to replace advice given to you by your health care provider. Make sure you discuss any questions you have with your health care provider.

## 2015-01-30 NOTE — Progress Notes (Signed)
[facility] [address] [place]. [place] [address] [phone]       OFFICE FOLLOW-UP NOTE  [name] Date of Birth:  08/16/1932 Medical Record Number:  [id number]   HPI: 81 year male seen for first follow up visit after [facility] admission for stroke on 11/27/14.Jeffrey [name] is an 81 y.o. male with hx of TIA, DM, prostate CA presented with fever and altered mental status since Saturday. Family notes difficulty speaking, trouble getting his words out. They also note some possible right sided weakness. Deny any recent medication changes, no tick exposure, no sick contacts. Denies a headache. At baseline they report he is active and independent. CT head on admission showed no acute abnormality but MRI scan showed an acute nonhemorrhagic white matter infarct in the right anterior frontal lobe. There are also remote lacunar infarcts noted in anterior left corona radiata. Carotid ultrasound suggested 80-90% left ICA stenosis which was felt to be asymptomatic. Transthoracic echo showed slightly reduced ejection fraction of 40-45% but no cardiac source for embolism. Hemoglobin A1c was 6.3. Patient at present without mental status and hence had a spinal tap which was negative for infectious etiology. [facility] spotted fever IgM was elevated at 1.88 but IgG was negative. He was treated with 2 week course of doxycycline. Patient states he is doing well is finished outpatient physical occupational therapy. His physical strength has improved but he does complain of poor stamina which seems to be improving slowly. He reports multiple episodes of brief staring blankly and being unresponsive for a few minutes. His last bili 30 seconds or so. It takes a little while to recover from these episodes. He was admitted to [facility] on May 15 for this episode had EEG at that time which was negative. He was seen by Dr.Aquino as an outpatient in July and had negative workup.  Patient was referred to Dr. Ruta Hinds vascular surgeon for evaluation for left carotid stenosis but apparently had an ultrasound in his office which showed the stenosis to be much less and hence conservative follow-up has been recommended for now. He had outpatient cardiac nuclear stress test which was negative. He is tolerating aspirin well without any side effects.  ROS:   14 system review of systems is positive for heat intolerance, murmur, daytime sleepiness, snoring, bladder urgency, difficulty urinating, tremors and all other systems negative  PMH:  Past Medical History  Diagnosis Date  . Hypertension   . Hypercholesteremia   . Impaired fasting glucose   . Rotator cuff tear, right 10/2010    supraspinatous and infraspinatous  . Diabetes mellitus   . Macular degeneration   . Heart murmur     mild aortic stenosis  . Radiation proctitis 11/2012    treated with APC ablation and Canasa suppositories (Dr. Hilarie Fredrickson)  . TIA (transient ischemic attack) 10/02/13    hosp at [facility] x 1 night  . 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 Aortic atherosclerosis 11/25/2014  . Acute encephalopathy 11/27/2014  . RMSF [facility] spotted fever) 10/2014    (hosp with FUO, encephalopathy)  . Multiple lacunar infarcts   . Acute CVA (cerebrovascular accident)   . Prostate cancer radiation + seeding implant (Dr.[name])  . Acute on chronic combined systolic and diastolic CHF (congestive heart failure)   . Atrial fibrillation     Social History:  Social History   Social History  . Marital Status: Married    Spouse Name:  N/A  . Number of Children: 4  . Years of Education: N/A   Occupational History  . Retired    Social History Main Topics  . Smoking status: Former Smoker -- 1.00 packs/day for 20 years    Types: Cigarettes    Quit date: 05/31/1980  . Smokeless tobacco: Never Used  . Alcohol Use: 4.2 oz/week    7 Cans of beer per week     Comment: 1/2  beer today  . Drug Use: No  . Sexual Activity:    Partners: Female   Other Topics Concern  . Not on file   Social History Narrative   Retired Engineer, structural.  2 sons, 2 daughters (all in [place]), 15 grandchildren, 1 great grandchild    Medications:   Current Outpatient Prescriptions on File Prior to Visit  Medication Sig Dispense Refill  . acetaminophen (TYLENOL ARTHRITIS PAIN) 650 MG CR tablet Take 1 tablet (650 mg total) by mouth every 8 (eight) hours as needed.    [name] aspirin 81 MG tablet Take 81 mg by mouth 2 (two) times daily.     . furosemide (LASIX) 20 MG tablet Take 1 tablet (20 mg total) by mouth daily. 30 tablet 1  . hydrocerin (EUCERIN) CREA Apply 1 application topically 2 (two) times daily.  0  . hydrocortisone cream 1 % Apply 1 application topically as needed (for ankles).    . metoprolol succinate (TOPROL-XL) 50 MG 24 hr tablet Take 1 tablet (50 mg total) by mouth 2 (two) times daily with a meal. Take with or immediately following a meal. 60 tablet 1  . Multiple Vitamins-Minerals (PRESERVISION AREDS) CAPS Take 1 capsule by mouth every morning.    . potassium chloride (K-DUR,KLOR-CON) 10 MEQ tablet Take 1 tablet (10 mEq total) by mouth 2 (two) times daily. (Patient taking differently: Take 10 mEq by mouth daily. ) 30 tablet 1   No current facility-administered medications on file prior to visit.    Allergies:  No Known Allergies  Physical Exam General: well developed, well nourished elderly male, seated, in no evident distress Head: head normocephalic and atraumatic.  Neck: supple with no carotid or supraclavicular bruits Cardiovascular: regular rate and rhythm, soft ejection systolic murmur Musculoskeletal: no deformity Skin:  no rash/petichiae Vascular:  Normal pulses all extremities Filed Vitals:   01/29/15 0843  BP: 160/85  Pulse: 70   Neurologic Exam Mental Status: Awake and fully alert. Oriented to place and time. Recent and remote memory intact. Attention  span, concentration and fund of knowledge appropriate. Mood and affect appropriate.  Cranial Nerves: Fundoscopic exam reveals sharp disc margins. Pupils equal, briskly reactive to light. Extraocular movements full without nystagmus. Visual fields full to confrontation. Hearing intact. Facial sensation intact. Face, tongue, palate moves normally and symmetrically.  Motor: Normal bulk and tone. Normal strength in all tested extremity muscles. Mild diminished fine finger movements on the left. Orbits right over left upper extremity. Sensory.: intact to touch ,pinprick .position and vibratory sensation.  Coordination: Rapid alternating movements normal in all extremities. Finger-to-nose and heel-to-shin performed accurately bilaterally. Gait and Station: Arises from chair without difficulty. Stance is normal. Gait demonstrates normal stride length and balance . Able to heel, toe and tandem walk without difficulty.  Reflexes: 1+ and symmetric. Toes downgoing.      ASSESSMENT: 81 year male with right frontal white matter infarct in June 2016 secondary to small vessel disease..Asymptomatic proximal moderate left ICA stenosis.    PLAN: I had a long d/w patient  and wife about his recent stroke, risk for recurrent stroke/TIAs, personally independently reviewed imaging studies and stroke evaluation results and answered questions.Continue aspirin 81 mg orally every day  for secondary stroke prevention and maintain strict control of hypertension with blood pressure goal below 130/90, diabetes with hemoglobin A1c goal below 6.5% and lipids with LDL cholesterol goal below 70 mg/dL. I also advised the patient to eat a healthy diet with plenty of whole grains, cereals, fruits and vegetables, exercise regularly and maintain ideal body weight .he was advised to continue follow-up with Dr. Ruta Hinds for his left carotid stenosis for revascularization consideration. Also check EEG for seizure activity given his  history of episodes of transient altered awareness and staring spells. Greater than 50% of time during this 25 minute visit was spent on counseling, discussion with patient and family and coordination of care Followup in the future with me in 6 months or call earlier if necessary. Antony Contras, MD  Note: This document was prepared with digital dictation and possible smart phrase technology. Any transcriptional errors that result from this process are unintentional

## 2015-02-03 ENCOUNTER — Other Ambulatory Visit: Payer: Self-pay | Admitting: Physical Medicine and Rehabilitation

## 2015-02-04 ENCOUNTER — Ambulatory Visit: Payer: Medicare Other | Admitting: Cardiovascular Disease

## 2015-02-04 ENCOUNTER — Encounter: Payer: Medicare Other | Attending: Physical Medicine & Rehabilitation | Admitting: Physical Medicine & Rehabilitation

## 2015-02-04 ENCOUNTER — Encounter: Payer: Self-pay | Admitting: Physical Medicine & Rehabilitation

## 2015-02-04 VITALS — BP 185/78 | HR 76

## 2015-02-04 DIAGNOSIS — R5381 Other malaise: Secondary | ICD-10-CM | POA: Diagnosis not present

## 2015-02-04 DIAGNOSIS — Z87891 Personal history of nicotine dependence: Secondary | ICD-10-CM | POA: Insufficient documentation

## 2015-02-04 DIAGNOSIS — I10 Essential (primary) hypertension: Secondary | ICD-10-CM | POA: Diagnosis not present

## 2015-02-04 DIAGNOSIS — I6381 Other cerebral infarction due to occlusion or stenosis of small artery: Secondary | ICD-10-CM

## 2015-02-04 DIAGNOSIS — E119 Type 2 diabetes mellitus without complications: Secondary | ICD-10-CM | POA: Insufficient documentation

## 2015-02-04 DIAGNOSIS — I639 Cerebral infarction, unspecified: Secondary | ICD-10-CM | POA: Diagnosis not present

## 2015-02-04 NOTE — Progress Notes (Signed)
Subjective:    Patient ID: Jeffrey [name], male    DOB: 05-27-1933, 81 y.o.   MRN: [id number]  HPI   Jeffrey [name] is here in follow up of his CVA. He has been home and performing most of his daily chores around the house. He denies pain. His sleep has been good. He wakes up occasionally to empty his bladder.   Mood has been good.   He has driven limited dx near his home. He doesn't have all of his energy back and is a little intolerant of the heat but is happy with his progress.  He is not using a device for gait.   Pain Inventory Average Pain 0 Pain Right Now 0 My pain is no pain   In the last 24 hours, has pain interfered with the following? General activity 0 Relation with others 0 Enjoyment of life 0 What TIME of day is your pain at its worst? no pain  Sleep (in general) no pain   Pain is worse with: no pain  Pain improves with: no pain  Relief from Meds: no pain   Mobility walk without assistance ability to climb steps?  yes do you drive?  yes  Function retired I need assistance with the following:  meal prep, household duties and shopping  Neuro/Psych No problems in this area  Prior Studies Any changes since last visit?  no  Physicians involved in your care Any changes since last visit?  no   Family History  Problem Relation Age of Onset  . Heart disease Father 66    Died suddenly.  No diagnosis  . Stroke Sister   . Dementia Mother   . Diabetes Mother   . Heart disease Mother   . Hypertension Mother   . Cancer Daughter     ovarian (?) vs other male cancer; s/p hyst doing well  . Diabetes Daughter     GDM, and now AODM  . Heart disease Son     congestive heart failure--improved   Social History   Social History  . Marital Status: Married    Spouse Name: N/A  . Number of Children: 4  . Years of Education: N/A   Occupational History  . Retired    Social History Main Topics  . Smoking status: Former Smoker -- 1.00 packs/day for 20 years   Types: Cigarettes    Quit date: 05/31/1980  . Smokeless tobacco: Never Used  . Alcohol Use: 4.2 oz/week    7 Cans of beer per week     Comment: 1/2 beer today  . Drug Use: No  . Sexual Activity:    Partners: Female   Other Topics Concern  . None   Social History Narrative   Retired Engineer, structural.  2 sons, 2 daughters (all in [place]), 15 grandchildren, 1 great grandchild   Past Surgical History  Procedure Laterality Date  . Tonsillectomy and adenoidectomy  age 43  . Prostate seed implant    . Rotator cuff repair  06/16/2011    right (Dr. Berenice Primas)  . Colonoscopy N/A 11/28/2012    Procedure: COLONOSCOPY;  Surgeon: Jerene Bears, MD;  Location: [facility];  Service: Gastroenterology;  Laterality: N/A;   Past Medical History  Diagnosis Date  . Hypertension   . Hypercholesteremia   . Impaired fasting glucose   . Rotator cuff tear, right 10/2010    supraspinatous and infraspinatous  . Diabetes mellitus   . Macular degeneration   . Heart murmur  mild aortic stenosis  . Radiation proctitis 11/2012    treated with APC ablation and Canasa suppositories (Dr. Hilarie Fredrickson)  . TIA (transient ischemic attack) 10/02/13    hosp at [facility] x 1 night  . 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 Aortic atherosclerosis 11/25/2014  . Acute encephalopathy 11/27/2014  . RMSF [facility] spotted fever) 10/2014    (hosp with FUO, encephalopathy)  . Multiple lacunar infarcts   . Acute CVA (cerebrovascular accident)   . Prostate cancer radiation + seeding implant (Dr.[name])  . Acute on chronic combined systolic and diastolic CHF (congestive heart failure)   . Atrial fibrillation    BP 185/78 mmHg  Pulse 76  SpO2 98%  Opioid Risk Score:   Fall Risk Score:  `1  Depression screen PHQ 2/9  Depression screen [facility] 2/9 02/04/2015 01/14/2014 01/03/2013  Decreased Interest 0 0 0  Down, Depressed, Hopeless 1 0 0  PHQ - 2 Score 1 0 0  Altered sleeping 1 - -  Tired, decreased  energy 2 - -  Change in appetite 0 - -  Feeling bad or failure about yourself  1 - -  Trouble concentrating 0 - -  Moving slowly or fidgety/restless 0 - -  Suicidal thoughts 0 - -  PHQ-9 Score 5 - -     Review of Systems  All other systems reviewed and are negative.      Objective:   Physical Exam  Constitutional: He appears well-developed and well-nourished. No distress.  Thin male.  HENT:  Head: Normocephalic and atraumatic.  Right Ear: External ear normal.  Left Ear: External ear normal.  Eyes: Conjunctivae are normal. Pupils are equal, round, and reactive to light. Right eye exhibits no discharge. Left eye exhibits no discharge. No scleral icterus.  Neck: Normal range of motion. Neck supple. No JVD present. No tracheal deviation present. No thyromegaly present.  Cardiovascular: Normal rate. An irregular rhythm present. Murmur heard. Respiratory: Effort normal. No tachypnea. No respiratory distress. He has decreased breath sounds in the right lower field and the left lower field. He has wheezes. He has rhonchi. He exhibits no tenderness.  GI: Soft. Bowel sounds are normal. He exhibits no distension. There is no tenderness. There is no rebound and no guarding.  Musculoskeletal: He exhibits no edema or tenderness.  Lymphadenopathy:   He has no cervical adenopathy.  Neurological: He is alert.  Moves all 4's with 5/5.  Balance is good with regular gait. A little impulsive---walks quite fast.  Lost balance when standing on one leg and heels. Walked without a device. No CN abnl. No sensory abnl.  Skin: Skin is warm and dry.    Psychiatric: His normal..  Assessment/Plan: 1. Functional deficits secondary to Right frontal lobe infarct and encephalopathy related to RMSF -has made nice progress!   -recommended that he "slow down" a bit with his gait and general activities--he tends to be a little impulsive  -follow up per surgery and neurology as  indicated.  I have nothing further to offer. See me prn

## 2015-02-04 NOTE — Patient Instructions (Signed)
PLEASE CALL ME WITH ANY PROBLEMS OR QUESTIONS (#[phone]).  HAVE A GOOD DAY!    

## 2015-02-06 ENCOUNTER — Other Ambulatory Visit: Payer: Self-pay | Admitting: Family Medicine

## 2015-02-24 ENCOUNTER — Ambulatory Visit (INDEPENDENT_AMBULATORY_CARE_PROVIDER_SITE_OTHER): Payer: Medicare Other | Admitting: Neurology

## 2015-02-24 ENCOUNTER — Other Ambulatory Visit: Payer: Self-pay | Admitting: Family Medicine

## 2015-02-24 DIAGNOSIS — IMO0001 Reserved for inherently not codable concepts without codable children: Secondary | ICD-10-CM

## 2015-02-24 DIAGNOSIS — R410 Disorientation, unspecified: Secondary | ICD-10-CM | POA: Diagnosis not present

## 2015-02-26 LAB — NM MYOCAR MULTI W/SPECT W/WALL MOTION / EF
CHL CUP NUCLEAR SRS: 0
CHL CUP NUCLEAR SSS: 0
CSEPHR: 75 %
LV dias vol: 96 mL
LV sys vol: 38 mL
Peak HR: 106 {beats}/min
Rest HR: 88 {beats}/min
SDS: 0
TID: 0.91

## 2015-02-26 NOTE — Procedures (Signed)
  [facility] [address] [place]. [place] [address] [phone]       Electroencephalogram Procedure Note  Mr. TEEGAN BRANDIS Date of Birth:  02-Feb-1933 Medical Record Number:  [id number]   Indications: Diagnostic Date of Procedure 02/23/2015 Medications: none Clinical history : Technical Description  This study was performed using 17 channel digital electroencephalographic recording equipment. International 10-20 electrode placement was used. The record was obtained with the patient awake and drowsy.  The record is of good technical quality for purposes of interpretation.   Activation Procedures:  hyperventilation and photic stimulation . EEG Description Awake: Alpha Activity: The waking state record contains a well-defined bi-occipital alpha rhythm of  moderate amplitude with a dominant frequency of 9-10 Hz. Reactivity is present.  No paroxsymal activity, spikes, or sharp waves are noted. Technical component of study is adequate.  Sleep: With drowsiness, there is attenuation of the background alpha activity. As the patient enters into light sleep, vertex waves and symmetrical spindles are noted. K complexes are noted in sleep. Transition to the waking state is unremarkable.   Result of Activation Procedures: Hyperventilation: Hyperventilation for three minutes fails to activate the recording. Photo Stimulation: Photic stimulation failed to activated the recording.  Summary Normal electroencephalogram, awake, asleep and with activation procedures. There are no focal lateralizing or epileptiform features.

## 2015-02-27 ENCOUNTER — Telehealth: Payer: Self-pay | Admitting: Neurology

## 2015-02-27 NOTE — Telephone Encounter (Signed)
Wife called requesting results from EEG 02/24/15

## 2015-02-27 NOTE — Telephone Encounter (Signed)
Rn talk to  Jeffrey [name] Uhrich(pts wife) to notify her that her husbands EEG was normal. Jeffrey [name] was normal and understands the results.

## 2015-03-03 ENCOUNTER — Ambulatory Visit: Payer: Medicare Other | Admitting: Cardiovascular Disease

## 2015-03-05 ENCOUNTER — Other Ambulatory Visit: Payer: Self-pay

## 2015-03-05 MED ORDER — APIXABAN 5 MG PO TABS: 5.0000 mg | ORAL_TABLET | Freq: Two times a day (BID) | ORAL | Status: DC

## 2015-03-13 ENCOUNTER — Telehealth: Payer: Self-pay | Admitting: Cardiovascular Disease

## 2015-03-13 NOTE — Telephone Encounter (Signed)
error 

## 2015-03-25 ENCOUNTER — Encounter: Payer: Self-pay | Admitting: Family Medicine

## 2015-03-25 DIAGNOSIS — Z23 Encounter for immunization: Secondary | ICD-10-CM | POA: Diagnosis not present

## 2015-04-02 ENCOUNTER — Other Ambulatory Visit: Payer: Self-pay | Admitting: *Deleted

## 2015-04-02 MED ORDER — LOVASTATIN 20 MG PO TABS
40.0000 mg | ORAL_TABLET | Freq: Every day | ORAL | Status: DC
Start: 2015-04-02 — End: 2015-04-28

## 2015-04-02 MED ORDER — METOPROLOL SUCCINATE ER 50 MG PO TB24: ORAL_TABLET | ORAL | Status: DC

## 2015-04-11 ENCOUNTER — Ambulatory Visit (INDEPENDENT_AMBULATORY_CARE_PROVIDER_SITE_OTHER): Payer: Medicare Other | Admitting: Cardiovascular Disease

## 2015-04-11 ENCOUNTER — Encounter: Payer: Self-pay | Admitting: Cardiovascular Disease

## 2015-04-11 VITALS — BP 140/88 | HR 72 | Ht 71.0 in | Wt 167.0 lb

## 2015-04-11 DIAGNOSIS — I10 Essential (primary) hypertension: Secondary | ICD-10-CM

## 2015-04-11 DIAGNOSIS — I350 Nonrheumatic aortic (valve) stenosis: Secondary | ICD-10-CM | POA: Diagnosis not present

## 2015-04-11 DIAGNOSIS — E7800 Pure hypercholesterolemia, unspecified: Secondary | ICD-10-CM | POA: Diagnosis not present

## 2015-04-11 DIAGNOSIS — I480 Paroxysmal atrial fibrillation: Secondary | ICD-10-CM

## 2015-04-11 DIAGNOSIS — I639 Cerebral infarction, unspecified: Secondary | ICD-10-CM

## 2015-04-11 NOTE — Assessment & Plan Note (Signed)
Lab Results  Component Value Date   CHOL 104 11/30/2014   HDL 11* 11/30/2014   LDLCALC 40 11/30/2014   TRIG 266* 11/30/2014   CHOLHDL 9.5 11/30/2014   Continue treatment with lovastatin. LDL was at target.

## 2015-04-11 NOTE — Assessment & Plan Note (Signed)
Blood pressure is reasonably controlled. 

## 2015-04-11 NOTE — Patient Instructions (Signed)
Medication Instructions: Stop taking Furosemide (Lasix) Continue other medications.   Labwork: None.   Procedures/Testing: None.   Follow-Up: 3 months with [name].   Any Additional Special Instructions Will Be Listed Below (If Applicable).

## 2015-04-11 NOTE — Assessment & Plan Note (Signed)
The murmur is suggestive of moderate stenosis but the echo showed mild stenosis few months ago. I will consider repeat echocardiogram next year.

## 2015-04-11 NOTE — Progress Notes (Signed)
Primary care physician: Dr. Tomi Bamberger  HPI  This is an 81 year old male who is here today for a follow-up visit regarding paroxysmal atrial fibrillation.  He has known history of hypertension, hyperlipidemia, TIA, mild aortic stenosis, and possibly diabetes mellitus (although not on hypoglycemic agents, HbA1C 6.3% on 7/1). He was admitted on 6/29 with a fever, altered mental status, and dysarthria with possible right sided weakness. MRI brain on 6/30 showed acute nonhemorrhagic white matter infarct of the anterior right frontal lobe and remote lacunar infarcts of the anterior left coronal radiata. Carotid Dopplers showed left 80-99% ICA stenosis. He subsequently followed with [name] and this was determined not to be critical and is being observed.  He was found to have [facility] Spotted Fever and was treated with doxycycline.  He also developed a paroxysm of atrial fibrillation with mildly elevated troponin.   Echocardiogram on 11/28/14 showed mild to moderately reduced LV systolic function, EF [id number], diffuse hypokinesis with severe apical hypokinesis, grade I diastolic dysfunction, moderately thickened and calcified aortic valve leaflets with mild stenosis, mild mitral regurgitation, mild left atrial dilatation, mildly reduced RV systolic function, mild pulmonary hypertension, with no definitive vegetations seen. The patient was started on anticoagulation with Eliquis.  He underwent a nuclear stress test in August which showed no evidence of ischemia with normal ejection fraction. Overall, he has been doing very well and denies any chest pain or shortness of breath. No palpitations.   No Known Allergies   Current Outpatient Prescriptions on File Prior to Visit  Medication Sig Dispense Refill  . acetaminophen (TYLENOL ARTHRITIS PAIN) 650 MG CR tablet Take 1 tablet (650 mg total) by mouth every 8 (eight) hours as needed.    [name] apixaban (ELIQUIS) 5 MG TABS tablet Take 1 tablet (5 mg total)  by mouth 2 (two) times daily. 60 tablet 5  . hydrocerin (EUCERIN) CREA Apply 1 application topically 2 (two) times daily.  0  . hydrocortisone cream 1 % Apply 1 application topically as needed (for ankles).    . lovastatin (MEVACOR) 20 MG tablet Take 2 tablets (40 mg total) by mouth at bedtime. 180 tablet 0  . metoprolol succinate (TOPROL-XL) 50 MG 24 hr tablet TAKE 1 TABLET BY MOUTH 2 TIMES DAILY (TAKE WITH OR IMMEDIATELY FOLLOWING A MEAL) 180 tablet 0  . Multiple Vitamins-Minerals (PRESERVISION AREDS) CAPS Take 1 capsule by mouth every morning.     No current facility-administered medications on file prior to visit.     Past Medical History  Diagnosis Date  . Hypertension   . Hypercholesteremia   . Impaired fasting glucose   . Rotator cuff tear, right 10/2010    supraspinatous and infraspinatous  . Diabetes mellitus   . Macular degeneration   . Heart murmur     mild aortic stenosis  . Radiation proctitis 11/2012    treated with APC ablation and Canasa suppositories (Dr. Hilarie Fredrickson)  . TIA (transient ischemic attack) 10/02/13    hosp at [facility] x 1 night  . 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 Aortic atherosclerosis ([place]) 11/25/2014  . Acute encephalopathy 11/27/2014  . RMSF [facility] spotted fever) 10/2014    (hosp with FUO, encephalopathy)  . Multiple lacunar infarcts ([place])   . Acute CVA (cerebrovascular accident) ([place])   . Prostate cancer ([place]) radiation + seeding implant (Dr.[name])  . Acute on chronic combined systolic and diastolic CHF (congestive heart failure) ([place])   . Atrial fibrillation ([place])  Past Surgical History  Procedure Laterality Date  . Tonsillectomy and adenoidectomy  age 72  . Prostate seed implant    . Rotator cuff repair  06/16/2011    right (Dr. Berenice Primas)  . Colonoscopy N/A 11/28/2012    Procedure: COLONOSCOPY;  Surgeon: Jerene Bears, MD;  Location: [facility];  Service: Gastroenterology;  Laterality: N/A;      Family History  Problem Relation Age of Onset  . Heart disease Father 28    Died suddenly.  No diagnosis  . Stroke Sister   . Dementia Mother   . Diabetes Mother   . Heart disease Mother   . Hypertension Mother   . Cancer Daughter     ovarian (?) vs other male cancer; s/p hyst doing well  . Diabetes Daughter     GDM, and now AODM  . Heart disease Son     congestive heart failure--improved     Social History   Social History  . Marital Status: Married    Spouse Name: N/A  . Number of Children: 4  . Years of Education: N/A   Occupational History  . Retired    Social History Main Topics  . Smoking status: Former Smoker -- 1.00 packs/day for 20 years    Types: Cigarettes    Quit date: 05/31/1980  . Smokeless tobacco: Never Used  . Alcohol Use: 4.2 oz/week    7 Cans of beer per week     Comment: 1/2 beer today  . Drug Use: No  . Sexual Activity:    Partners: Female   Other Topics Concern  . Not on file   Social History Narrative   Retired Engineer, structural.  2 sons, 2 daughters (all in [place]), 15 grandchildren, 1 great grandchild     ROS A 10 point review of system was performed. It is negative other than that mentioned in the history of present illness.   PHYSICAL EXAM   BP 140/88 mmHg  Pulse 72  Ht 5\' 11"  (1.803 m)  Wt 167 lb (75.751 kg)  BMI 23.30 kg/m2 Constitutional: He is oriented to person, place, and time. He appears well-developed and well-nourished. No distress.  HENT: No nasal discharge.  Head: Normocephalic and atraumatic.  Eyes: Pupils are equal and round.  No discharge. Neck: Normal range of motion. Neck supple. No JVD present. No thyromegaly present. There is left carotid bruit. Cardiovascular: Normal rate, regular rhythm with premature beats, normal heart sounds. Exam reveals no gallop and no friction rub. There is a 2/6 systolic ejection murmur in the aortic area which is mid peaking.  Pulmonary/Chest: Effort normal and breath sounds  normal. No stridor. No respiratory distress. He has no wheezes. He has no rales. He exhibits no tenderness.  Abdominal: Soft. Bowel sounds are normal. He exhibits no distension. There is no tenderness. There is no rebound and no guarding.  Musculoskeletal: Normal range of motion. He exhibits no edema and no tenderness.  Neurological: He is alert and oriented to person, place, and time. Coordination normal.  Skin: Skin is warm and dry. No rash noted. He is not diaphoretic. No erythema. No pallor.  Psychiatric: He has a normal mood and affect. His behavior is normal. Judgment and thought content normal.       ASSESSMENT AND PLAN

## 2015-04-11 NOTE — Assessment & Plan Note (Addendum)
The patient is in sinus rhythm with premature beats. I recommend lifelong anticoagulation. He currently has no prescription coverage and I advised him to obtain coverage this year. The option of warfarin was discussed with the patient and his wife and if cost continues to be an issue, he might have to consider this route. I discontinued Lasix as there is no evidence of fluid overload and ejection fraction improved to normal on nuclear stress test.

## 2015-04-12 ENCOUNTER — Other Ambulatory Visit: Payer: Self-pay | Admitting: Family Medicine

## 2015-04-28 ENCOUNTER — Ambulatory Visit (INDEPENDENT_AMBULATORY_CARE_PROVIDER_SITE_OTHER): Payer: Medicare Other | Admitting: Family Medicine

## 2015-04-28 ENCOUNTER — Encounter: Payer: Self-pay | Admitting: Family Medicine

## 2015-04-28 VITALS — BP 220/100 | HR 84 | Ht 70.0 in | Wt 170.6 lb

## 2015-04-28 DIAGNOSIS — E119 Type 2 diabetes mellitus without complications: Secondary | ICD-10-CM | POA: Diagnosis not present

## 2015-04-28 DIAGNOSIS — E7800 Pure hypercholesterolemia, unspecified: Secondary | ICD-10-CM | POA: Diagnosis not present

## 2015-04-28 DIAGNOSIS — I10 Essential (primary) hypertension: Secondary | ICD-10-CM

## 2015-04-28 DIAGNOSIS — I6522 Occlusion and stenosis of left carotid artery: Secondary | ICD-10-CM | POA: Diagnosis not present

## 2015-04-28 DIAGNOSIS — R350 Frequency of micturition: Secondary | ICD-10-CM

## 2015-04-28 DIAGNOSIS — I480 Paroxysmal atrial fibrillation: Secondary | ICD-10-CM | POA: Diagnosis not present

## 2015-04-28 DIAGNOSIS — E785 Hyperlipidemia, unspecified: Secondary | ICD-10-CM | POA: Diagnosis not present

## 2015-04-28 DIAGNOSIS — Z0000 Encounter for general adult medical examination without abnormal findings: Secondary | ICD-10-CM | POA: Diagnosis not present

## 2015-04-28 DIAGNOSIS — I639 Cerebral infarction, unspecified: Secondary | ICD-10-CM | POA: Diagnosis not present

## 2015-04-28 DIAGNOSIS — Z5181 Encounter for therapeutic drug level monitoring: Secondary | ICD-10-CM | POA: Diagnosis not present

## 2015-04-28 LAB — COMPREHENSIVE METABOLIC PANEL
ALBUMIN: 5 g/dL (ref 3.6–5.1)
ALT: 19 U/L (ref 9–46)
AST: 23 U/L (ref 10–35)
Alkaline Phosphatase: 41 U/L (ref 40–115)
BUN: 17 mg/dL (ref 7–25)
CALCIUM: 9.8 mg/dL (ref 8.6–10.3)
CHLORIDE: 95 mmol/L — AB (ref 98–110)
CO2: 30 mmol/L (ref 20–31)
CREATININE: 0.92 mg/dL (ref 0.70–1.11)
Glucose, Bld: 122 mg/dL — ABNORMAL HIGH (ref 65–99)
POTASSIUM: 4 mmol/L (ref 3.5–5.3)
Sodium: 137 mmol/L (ref 135–146)
TOTAL PROTEIN: 8.1 g/dL (ref 6.1–8.1)
Total Bilirubin: 0.6 mg/dL (ref 0.2–1.2)

## 2015-04-28 LAB — CBC WITH DIFFERENTIAL/PLATELET
BASOS ABS: 0 10*3/uL (ref 0.0–0.1)
Basophils Relative: 0 % (ref 0–1)
EOS PCT: 1 % (ref 0–5)
Eosinophils Absolute: 0.1 10*3/uL (ref 0.0–0.7)
HEMATOCRIT: 43.1 % (ref 39.0–52.0)
Hemoglobin: 14.6 g/dL (ref 13.0–17.0)
LYMPHS PCT: 31 % (ref 12–46)
Lymphs Abs: 2.9 10*3/uL (ref 0.7–4.0)
MCH: 32.4 pg (ref 26.0–34.0)
MCHC: 33.9 g/dL (ref 30.0–36.0)
MCV: 95.6 fL (ref 78.0–100.0)
MPV: 9.9 fL (ref 8.6–12.4)
Monocytes Absolute: 1.1 10*3/uL — ABNORMAL HIGH (ref 0.1–1.0)
Monocytes Relative: 12 % (ref 3–12)
NEUTROS ABS: 5.3 10*3/uL (ref 1.7–7.7)
NEUTROS PCT: 56 % (ref 43–77)
Platelets: 327 10*3/uL (ref 150–400)
RBC: 4.51 MIL/uL (ref 4.22–5.81)
RDW: 13.5 % (ref 11.5–15.5)
WBC: 9.4 10*3/uL (ref 4.0–10.5)

## 2015-04-28 LAB — POCT URINALYSIS DIPSTICK
Bilirubin, UA: NEGATIVE
Blood, UA: NEGATIVE
Glucose, UA: NEGATIVE
KETONES UA: NEGATIVE
LEUKOCYTES UA: NEGATIVE
Nitrite, UA: NEGATIVE
PH UA: 5.5
PROTEIN UA: NEGATIVE
Spec Grav, UA: 1.025
UROBILINOGEN UA: NEGATIVE

## 2015-04-28 LAB — LIPID PANEL
CHOL/HDL RATIO: 3 ratio (ref <=5.0)
Cholesterol: 210 mg/dL — ABNORMAL HIGH (ref 125–200)
HDL: 71 mg/dL (ref >=40)
LDL CALC: 103 mg/dL (ref <130)
TRIGLYCERIDES: 182 mg/dL — AB (ref <150)
VLDL: 36 mg/dL — ABNORMAL HIGH (ref <30)

## 2015-04-28 LAB — POCT GLYCOSYLATED HEMOGLOBIN (HGB A1C): Hemoglobin A1C: 6

## 2015-04-28 MED ORDER — LOVASTATIN 20 MG PO TABS: ORAL_TABLET | ORAL | Status: DC

## 2015-04-28 NOTE — Progress Notes (Signed)
Chief Complaint  Patient presents with  . Med check plus    AWV, fasting. Has periods where he itches like crazy, since starting Eliquis(slight rash in various places).    Jeffrey [name] is a 81 y.o. male who presents for annual wellness visit and follow-up on chronic medical conditions.  He complains of some pruritis--see below.  Hypertension:  BP's at home have been ranging from 100/70, mostly 120-130's, up to [id number], with diastolic always 0000000. Denies headaches, lightheadedness.  Sometimes staggers just slightly (since the stroke).  Denies chest pain, palpitations, shortness of breath, edema. He currently denies headache.  He recently saw the cardiologist who stopped his lasix (no evidence of fluid overload and EF improved to normal on nuclear stress test).  He recommends lifelong anticoagulation.  He may repeat echo next year to f/u on aortic stenosis.  Patient denies any bleeding/bruising or side effects from medication.  He has some questions about Eliquis (pharmacist mentioned using only x 6 mos, pt told he would need it long-term).  He used to have rash just around the ankles, relieved by prn use of hydrocortisone. Recently he started itching from the top of his head to the tip of his toes.  He reports this started about 6 months ago, but isn't 24 hours/day, seems to come and go some. The other day he broke out into welts after drinking OJ, lasted 3-4 hours then resolved completely.  He is wondering if it could be due to Eliquis. He also has noted some "small pimples" on his chest, and where his glasses sit, on the side of his nose.  DM/IFG--He doesn't check his sugars. He goes to the eye doctor every six months. He denies any feet concerns, numbness/tingling. He usually eats more sweets this time of year.  Drinking 2-3 beers/day.  Hyperlipidemia: Compliant with medications; denies side effects. He likes fried food, but has cut back on them.  He had normal EEG in September  Immunization  History  Administered Date(s) Administered  . Influenza Split 01/30/2012, 02/03/2014, 03/25/2015  . Influenza-Unspecified 01/29/2013, 03/25/2015  . Pneumococcal Conjugate-13 10/17/2014  . Pneumococcal Polysaccharide-23 12/17/2004, 10/02/2012  . Td 10/29/2004   Last colonoscopy: 11/2012 with Dr. Hilarie Fredrickson (radiation proctitis)  Last PSA: per urologist, every 6 months (last in June) Dentist: previously went twice yearly --hasn't been in over a year since his dentist retired. Ophtho: twice yearly  Exercise: yardwork (limited exercise) Walks the dogs, but slow/frequent stops.   Other doctors caring for patient include:  GI--Dr. Hilarie Fredrickson  Urologist: [name]  Dentist: Dr. Dennie Maizes retired, hasn't seen anyone else yet. Ophtho: Dr. Shea Stakes (at [facility])  Cardiologist: [name] Ortho: Dr. Berenice Primas (no longer under his care; problem resolved)  Neuro: [name] Vascular: [name]  Depression screen: negative (see screen in epic). Fall screen: none in the last year ADL screen--notable for decreased hearing and vision, and some trouble with forgetfulness (see screen in [facility]).  End of Life Discussion:  Patient does not have a living will and medical power of attorney. Paperwork was given today, and importance was discussed  Past Medical History  Diagnosis Date  . Hypertension   . Hypercholesteremia   . Impaired fasting glucose   . Rotator cuff tear, right 10/2010    supraspinatous and infraspinatous  . Diabetes mellitus   . Macular degeneration   . Heart murmur     mild aortic stenosis  . Radiation proctitis 11/2012    treated with APC ablation and Canasa suppositories (Dr. Hilarie Fredrickson)  .  TIA (transient ischemic attack) 10/02/13    hosp at [facility] x 1 night  . 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 Aortic atherosclerosis ([place]) 11/25/2014  . Acute encephalopathy 11/27/2014  . RMSF [facility] spotted fever) 10/2014    (hosp with FUO, encephalopathy)   . Multiple lacunar infarcts ([place])   . Acute CVA (cerebrovascular accident) ([place])   . Prostate cancer ([place]) radiation + seeding implant (Dr.[name])  . Acute on chronic combined systolic and diastolic CHF (congestive heart failure) ([place])   . Atrial fibrillation [facility])     Past Surgical History  Procedure Laterality Date  . Tonsillectomy and adenoidectomy  age 63  . Prostate seed implant    . Rotator cuff repair  06/16/2011    right (Dr. Berenice Primas)  . Colonoscopy N/A 11/28/2012    Procedure: COLONOSCOPY;  Surgeon: Jerene Bears, MD;  Location: [facility] ENDOSCOPY;  Service: Gastroenterology;  Laterality: N/A;    Social History   Social History  . Marital Status: Married    Spouse Name: N/A  . Number of Children: 4  . Years of Education: N/A   Occupational History  . Retired    Social History Main Topics  . Smoking status: Former Smoker -- 1.00 packs/day for 20 years    Types: Cigarettes    Quit date: 05/31/1980  . Smokeless tobacco: Never Used  . Alcohol Use: 4.2 oz/week    7 Cans of beer per week     Comment: 1/2 beer today  . Drug Use: No  . Sexual Activity:    Partners: Female   Other Topics Concern  . Not on file   Social History Narrative   Retired Engineer, structural.  2 sons, 2 daughters (all in [place]), 15 grandchildren, 1 great grandchild    Family History  Problem Relation Age of Onset  . Heart disease Father 21    Died suddenly.  No diagnosis  . Stroke Sister   . Dementia Mother   . Diabetes Mother   . Heart disease Mother   . Hypertension Mother   . Cancer Daughter     ovarian (?) vs other male cancer; s/p hyst doing well  . Diabetes Daughter     GDM, and now AODM  . Heart disease Son     congestive heart failure--improved    Outpatient Encounter Prescriptions as of 04/28/2015  Medication Sig Note  . acetaminophen (TYLENOL ARTHRITIS PAIN) 650 MG CR tablet Take 1 tablet (650 mg total) by mouth every 8 (eight) hours as needed. 04/28/2015: 2 qam and 2 qpm  .  apixaban (ELIQUIS) 5 MG TABS tablet Take 1 tablet (5 mg total) by mouth 2 (two) times daily.   . hydrocortisone cream 1 % Apply 1 application topically as needed (for ankles). 12/23/2014: Uses prn for rash on ankles  . lovastatin (MEVACOR) 20 MG tablet TAKE 2 TABLETS (40 MG TOTAL) BY MOUTH AT BEDTIME.   . metoprolol succinate (TOPROL-XL) 50 MG 24 hr tablet TAKE 1 TABLET BY MOUTH 2 TIMES DAILY (TAKE WITH OR IMMEDIATELY FOLLOWING A MEAL)   . Multiple Vitamins-Minerals (PRESERVISION AREDS) CAPS Take 1 capsule by mouth every morning.   . [DISCONTINUED] lovastatin (MEVACOR) 20 MG tablet TAKE 2 TABLETS (40 MG TOTAL) BY MOUTH AT BEDTIME.   . hydrocerin (EUCERIN) CREA Apply 1 application topically 2 (two) times daily. (Patient not taking: Reported on 04/28/2015)   . [DISCONTINUED] lovastatin (MEVACOR) 20 MG tablet Take 2 tablets (40 mg total) by mouth  at bedtime.    No facility-administered encounter medications on file as of 04/28/2015.    No Known Allergies  ROS: The patient denies anorexia (appetite is great!), fever, weight changes, headaches, ear pain, hoarseness, chest pain, palpitations, dyspnea on exertion, cough, swelling, nausea, vomiting, diarrhea, constipation, abdominal pain, melena, hematochezia, indigestion/heartburn, hematuria, weakened urine stream, dysuria, genital lesions, joint pains (just arthritis in hands), numbness, tingling, weakness, suspicious skin lesions, depression, anxiety, abnormal bleeding/bruising, or enlarged lymph nodes. Bleeding a little easier since taking aspirin daily +bilateral hearing loss, L>R  Some nasal congestion/allergies Vision loss--worse at night, doesn't drive at night. Known macular degeneration. Urinary urgency, some nocturia. Frequency is worse when at the doctor's office and anxious. +ED Sometimes he will take a nap in the afternoon, more than he used to. He states that wife says he snores, but no apnea. Slight tremor (both hands,  intermittent)   PHYSICAL EXAM:  BP 220/100 mmHg  Pulse 84  Ht 5\' 10"  (1.778 m)  Wt 170 lb 9.6 oz (77.384 kg)  BMI 24.48 kg/m2 212/100 on repeat by MD, RA  General Appearance:  Alert, cooperative, no distress, appears stated age   Head:  Normocephalic, without obvious abnormality, atraumatic   Eyes:  PERRL, conjunctiva/corneas clear, EOM's intact, fundi  benign   Ears:  Normal TM's and external ear canals   Nose:  Nares normal, mucosa normal, no drainage or sinus tenderness   Throat:  Lips, mucosa, and tongue normal; gums normal. Tooth decay noted  Neck:  Supple, no lymphadenopathy; thyroid: no enlargement/tenderness/nodules; some radiation of murmur up to carotids. no JVD   Back:  Spine nontender, no curvature, ROM normal, no CVA tenderness   Lungs:  Clear to auscultation bilaterally without wheezes, rales or ronchi; respirations unlabored   Chest Wall:  No tenderness or deformity   Heart:  Regular rate and rhythm with some ectopy noted; S1 and S2 normal, no rub or gallop. 3/6 SEM heard throughout, loudest at RUSB.  Breast Exam:  No chest wall tenderness, masses or gynecomastia   Abdomen:  Soft, non-tender, nondistended, normoactive bowel sounds,  no masses, no hepatosplenomegaly   Genitalia:  Deferred to urologist   Rectal:  Deferred   Extremities:  No clubbing, cyanosis or edema   Pulses:  2+ and symmetric all extremities   Skin:  Skin color, texture, turgor normal, no rashes or lesions; Large SK on right face (80mm width x 9mm height), SK's and skin tags in central chest, some excoriations.  Lymph nodes:  Cervical, supraclavicular, and axillary nodes normal   Neurologic:  CNII-XII intact, normal strength, sensation and gait; reflexes 2+ and symmetric throughout    Psych: Normal mood, affect, hygiene and grooming.   Diabetic foot exam performed--left 3rd and 4th toenail is white         Lab Results   Component Value Date   HGBA1C 6.0 04/28/2015     Chemistry      Component Value Date/Time   NA 141 01/10/2015 0001   NA 137 10/01/2013 1719   K 5.0 01/10/2015 0001   K 3.6 10/01/2013 1719   CL 102 01/10/2015 0001   CL 101 10/01/2013 1719   CO2 27 01/10/2015 0001   CO2 27 10/01/2013 1719   BUN 17 01/10/2015 0001   BUN 17 10/01/2013 1719   CREATININE 0.75 01/10/2015 0001   CREATININE 0.85 12/09/2014 0535   CREATININE 0.87 10/01/2013 1719      Component Value Date/Time   CALCIUM 9.5 01/10/2015 0001  CALCIUM 9.4 10/01/2013 1719   ALKPHOS 39* 12/23/2014 0001   ALKPHOS 46 10/01/2013 1719   AST 23 12/23/2014 0001   AST 31 10/01/2013 1719   ALT 36 12/23/2014 0001   ALT 45 10/01/2013 1719   BILITOT 0.6 12/23/2014 0001   BILITOT 0.4 10/01/2013 1719     Urine dip normal   ASSESSMENT/PLAN:  Medicare annual wellness visit, subsequent  Diabetes mellitus without complication ([place]) - controlled with diet alone. reviewed diet; cut back on alcohol; regular exercise - Plan: HgB A1c, Comprehensive metabolic panel, Microalbumin / creatinine urine ratio  Medication monitoring encounter - Plan: Comprehensive metabolic panel, CBC with Differential/Platelet, Lipid panel  Hyperlipidemia - Plan: Lipid panel  Essential hypertension, benign - okay per home numbers; very high in office. Monitor regularly at home and contact us if remains high  Pure hypercholesterolemia - recheck today, was at goal on last check (July) - Plan: lovastatin (MEVACOR) 20 MG tablet  Paroxysmal atrial fibrillation (H[facility]) - anticoagulated  Left carotid artery stenosis - due for f/u with vascular with repeat ultrasound  Urinary frequency - no evidence of infection - Plan: POCT Urinalysis Dipstick  Pruritis--possibly related to some dry skin  Encouraged adequate hydration, avoiding prolonged hot showers, discussed frequent moisturizer use. Reassured regarding benign skin findings seen today.   Recommended at  least 30 minutes of aerobic activity at least 5 days/week, weight-bearing exercise 2x/wk; proper sunscreen use reviewed; healthy diet and alcohol recommendations (1 drink/day or less preferred, occasionally 2) reviewed; regular seatbelt use; changing batteries in smoke detectors. Immunization recommendations --zostavax and Tdap recommended.  Tdap not covered by Medicare.  He plans to get a part D supplement (to help pay for his Eliquis)--given written prescriptions for both vaccines, to get at pharmacy. Colonoscopy recommendations reviewed--UTD.  Full Code, Full Care Given paperwork for Living Will and Healthcare power of attorney and discussed importance in filling out these papers, and returning copy to [place].   Medicare Attestation I have personally reviewed: The patient's medical and social history Their use of alcohol, tobacco or illicit drugs Their current medications and supplements The patient's functional ability including ADLs,fall risks, home safety risks, cognitive, and hearing and visual impairment Diet and physical activities Evidence for depression or mood disorders  The patient's weight, height, and BMI have been recorded in the chart.  I have made referrals, counseling, and provided education to the patient based on review of the above and I have provided the patient with a written personalized care plan for preventive services.     Caydence Koenig A, MD   04/28/2015

## 2015-04-28 NOTE — Patient Instructions (Addendum)
  HEALTH MAINTENANCE RECOMMENDATIONS:  It is recommended that you get at least 30 minutes of aerobic exercise at least 5 days/week (for weight loss, you may need as much as 60-90 minutes). This can be any activity that gets your heart rate up. This can be divided in 10-15 minute intervals if needed, but try and build up your endurance at least once a week.  Weight bearing exercise is also recommended twice weekly.  Eat a healthy diet with lots of vegetables, fruits and fiber.  "Colorful" foods have a lot of vitamins (ie green vegetables, tomatoes, red peppers, etc).  Limit sweet tea, regular sodas and alcoholic beverages, all of which has a lot of calories and sugar.  Up to 2 alcoholic drinks daily may be beneficial for men (unless trying to lose weight, watch sugars).  Drink a lot of water.  Sunscreen of at least SPF 30 should be used on all sun-exposed parts of the skin when outside between the hours of 10 am and 4 pm (not just when at beach or pool, but even with exercise, golf, tennis, and yard work!)  Use a sunscreen that says "broad spectrum" so it covers both UVA and UVB rays, and make sure to reapply every 1-2 hours.  Remember to change the batteries in your smoke detectors when changing your clock times in the spring and fall.  Use your seat belt every time you are in a car, and please drive safely and not be distracted with cell phones and texting while driving.  Please fill out the Living Will and healthcare power of attorney forms and supply [place] with a copy.    Jeffrey [name] , Thank you for taking time to come for your Medicare Wellness Visit. I appreciate your ongoing commitment to your health goals. Please review the following plan we discussed and let me know if I can assist you in the future.   These are the goals we discussed: Goals    None      This is a list of the screening recommended for you and due dates:  Health Maintenance  Topic Date Due  . Eye exam for diabetics   11/12/1942  . Shingles Vaccine  11/13/1994  . Tetanus Vaccine  10/30/2014  . Complete foot exam   01/15/2015  . Urine Protein Check  01/15/2015  . Hemoglobin A1C  06/02/2015  . Flu Shot  12/30/2015  . Pneumonia vaccines  Completed   You need to get a Tdap (tetanus with pertussis) vaccine.  I gave you a prescription so that you can check on the price at the pharmacy.  This may be covered better when you get part D Medicare coverage. I also recommend the shingles vaccine.  You were given prescription for this as well, to get from the pharmacy when you have part D coverage.  You see the eye doctor regularly.  Have him forward a copy of a "diabetic eye exam" once yearly to [place] please.  We did the foot exam today, as well as the urine for protein.  Please send [place] a note in the next few days with what your blood pressures are running at home (since it was so high in the office today). ``

## 2015-04-29 ENCOUNTER — Encounter: Payer: Self-pay | Admitting: Vascular Surgery

## 2015-04-29 LAB — MICROALBUMIN / CREATININE URINE RATIO
Creatinine, Urine: 21 mg/dL (ref 20–370)
Microalb Creat Ratio: 90 mcg/mg creat — ABNORMAL HIGH (ref <30)
Microalb, Ur: 1.9 mg/dL

## 2015-04-30 ENCOUNTER — Telehealth: Payer: Self-pay | Admitting: *Deleted

## 2015-04-30 ENCOUNTER — Other Ambulatory Visit: Payer: Self-pay | Admitting: *Deleted

## 2015-04-30 DIAGNOSIS — Z79899 Other long term (current) drug therapy: Secondary | ICD-10-CM

## 2015-04-30 DIAGNOSIS — E7800 Pure hypercholesterolemia, unspecified: Secondary | ICD-10-CM

## 2015-04-30 MED ORDER — LOVASTATIN 40 MG PO TABS: 80.0000 mg | ORAL_TABLET | Freq: Every day | ORAL | Status: DC

## 2015-04-30 NOTE — Telephone Encounter (Signed)
noted 

## 2015-04-30 NOTE — Telephone Encounter (Signed)
Spoke with patient to go over labs and he gave me his bp readings: Mon: 4pm 117/58 Tues: AM 131/62           4pm 108/55 Wed: AM 170/81

## 2015-05-01 ENCOUNTER — Encounter: Payer: Self-pay | Admitting: Vascular Surgery

## 2015-05-01 ENCOUNTER — Encounter: Payer: Self-pay | Admitting: Family Medicine

## 2015-05-01 ENCOUNTER — Ambulatory Visit (HOSPITAL_COMMUNITY)
Admission: RE | Admit: 2015-05-01 | Discharge: 2015-05-01 | Disposition: A | Discharge status: Home / Self Care | Payer: Medicare Other | Source: Ambulatory Visit | Attending: Vascular Surgery | Admitting: Vascular Surgery

## 2015-05-01 ENCOUNTER — Ambulatory Visit (INDEPENDENT_AMBULATORY_CARE_PROVIDER_SITE_OTHER): Payer: Medicare Other | Admitting: Vascular Surgery

## 2015-05-01 VITALS — BP 214/110 | HR 68 | Temp 97.9°F | Ht 70.0 in | Wt 168.2 lb

## 2015-05-01 DIAGNOSIS — I6522 Occlusion and stenosis of left carotid artery: Secondary | ICD-10-CM | POA: Diagnosis not present

## 2015-05-01 DIAGNOSIS — I10 Essential (primary) hypertension: Secondary | ICD-10-CM | POA: Diagnosis not present

## 2015-05-01 DIAGNOSIS — E119 Type 2 diabetes mellitus without complications: Secondary | ICD-10-CM | POA: Insufficient documentation

## 2015-05-01 DIAGNOSIS — E785 Hyperlipidemia, unspecified: Secondary | ICD-10-CM | POA: Diagnosis not present

## 2015-05-01 DIAGNOSIS — I6523 Occlusion and stenosis of bilateral carotid arteries: Secondary | ICD-10-CM | POA: Diagnosis not present

## 2015-05-01 DIAGNOSIS — I639 Cerebral infarction, unspecified: Secondary | ICD-10-CM

## 2015-05-01 NOTE — Progress Notes (Signed)
She is an 81 year old male who returns for follow-up today for asymptomatic carotid stenosis. He continues to deny any symptoms of TIA amaurosis or stroke. He was quite ill several months ago in the intensive care unit has recovered significantly. He states he still fatigues easily but feels overall improved. He is now on Eliquis for atrial fibrillation. His primary care physician is Aris [place]. Chronic medical problems include hypertension elevated cholesterol and diabetes atrial fibrillation all of which are currently stable.  Review of systems: He denies shortness of breath. He denies chest pain.  Past Medical History  Diagnosis Date  . Hypertension   . Hypercholesteremia   . Impaired fasting glucose   . Rotator cuff tear, right 10/2010    supraspinatous and infraspinatous  . Diabetes mellitus   . Macular degeneration   . Heart murmur     mild aortic stenosis  . Radiation proctitis 11/2012    treated with APC ablation and Canasa suppositories (Dr. Hilarie Fredrickson)  . TIA (transient ischemic attack) 10/02/13    [facility]osp [facility] night  . 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 Aortic atherosclerosis ([place]) 11/25/2014  . Acute encephalopathy 11/27/2014  . RMSF [facility] spotted fever) 10/2014    (hosp with FUO, encephalopathy)  . Multiple lacunar infarcts ([place])   . Acute CVA (cerebrovascular accident) ([place])   . Prostate cancer ([place]) radiation + seeding implant (Dr.[name])  . Acute on chronic combined systolic and diastolic CHF (congestive heart failure) ([place])   . Atrial fibrillation [facility])      Current Outpatient Prescriptions on File Prior to Visit  Medication Sig Dispense Refill  . acetaminophen (TYLENOL ARTHRITIS PAIN) 650 MG CR tablet Take 1 tablet (650 mg total) by mouth every 8 (eight) hours as needed.    [name] apixaban (ELIQUIS) 5 MG TABS tablet Take 1 tablet (5 mg total) by mouth 2 (two) times daily. 60 tablet 5  . hydrocerin (EUCERIN) CREA Apply 1  application topically 2 (two) times daily. (Patient not taking: Reported on 04/28/2015)  0  . hydrocortisone cream 1 % Apply 1 application topically as needed (for ankles).    . lovastatin (MEVACOR) 40 MG tablet Take 2 tablets (80 mg total) by mouth at bedtime. 180 tablet 1  . metoprolol succinate (TOPROL-XL) 50 MG 24 hr tablet TAKE 1 TABLET BY MOUTH 2 TIMES DAILY (TAKE WITH OR IMMEDIATELY FOLLOWING A MEAL) 180 tablet 0  . Multiple Vitamins-Minerals (PRESERVISION AREDS) CAPS Take 1 capsule by mouth every morning.     No current facility-administered medications on file prior to visit.   Physical exam:  Filed Vitals:   05/01/15 0939 05/01/15 0940  BP: 213/204 214/110  Pulse: 68   Temp: 97.9 F (36.6 C)   TempSrc: Oral   Height: 5\' 10"  (1.778 m)   Weight: 168 lb 3.2 oz (76.295 kg)   SpO2: 99%    Neck: No carotid bruits new.  Chest: Clear bilaterally Cardiac: Regular rate and rhythm 3/6 systolic murmur  Data: Carotid duplex exam from today was reviewed. This shows 60-80% left internal carotid artery stenosis with essentially no change from August 2016. Right side less than 40% stenosis  Assessment: Asymptomatic moderate left internal carotid artery stenosis plan: Follow-up 6 months repeat carotid duplex scan or sooner if develops symptoms of TIA amaurosis or stroke.  Ruta Hinds, MD [facility] Office: [phone] Pager: [phone]

## 2015-05-01 NOTE — Patient Instructions (Signed)
°Carotid Artery Disease °The carotid arteries are the two main arteries on either side of the neck that supply blood to the brain. Carotid artery disease, also called carotid artery stenosis, is the narrowing or blockage of one or both carotid arteries. Carotid artery disease increases your risk for a stroke or a transient ischemic attack (TIA). A TIA is an episode in which a waxy, fatty substance that accumulates within the artery (plaque) blocks blood flow to the brain. A TIA is considered a "warning stroke."  °CAUSES  °· Buildup of plaque inside the carotid arteries (atherosclerosis) (common). °· A weakened outpouching in an artery (aneurysm). °· Inflammation of the carotid artery (arteritis). °· A fibrous growth within the carotid artery (fibromuscular dysplasia). °· Tissue death within the carotid artery due to radiation treatment (post-radiation necrosis). °· Decreased blood flow due to spasms of the carotid artery (vasospasm). °· Separation of the walls of the carotid artery (carotid dissection). °RISK FACTORS °· High cholesterol (dyslipidemia).   °· High blood pressure (hypertension).   °· Smoking.   °· Obesity.   °· Diabetes.   °· Family history of cardiovascular disease.   °· Inactivity or lack of regular exercise.   °· Being male. Men have an increased risk of developing atherosclerosis earlier in life than women.   °SYMPTOMS  °Carotid artery disease does not cause symptoms. °DIAGNOSIS °Diagnosis of carotid artery disease may include:  °· A physical exam. Your health care provider may hear an abnormal sound (bruit) when listening to the carotid arteries.   °· Specific tests that look at the blood flow in the carotid arteries. These tests include:   °¨ Carotid artery ultrasonography.   °¨ Carotid or cerebral angiography.   °¨ Computerized tomographic angiography (CTA).   °¨ Magnetic resonance angiography (MRA).   °TREATMENT  °Treatment of carotid artery disease can include a combination of treatments.  Treatment options include: °· Surgery. You may have:   °¨ A carotid endarterectomy. This is a surgery to remove the blockages in the carotid arteries.   °¨ A carotid angioplasty with stenting. This is a nonsurgical interventional procedure. A wire mesh (stent) is used to widen the blocked carotid arteries.   °· Medicines to control blood pressure, cholesterol, and reduce blood clotting (antiplatelet therapy).   °· Adjusting your diet.   °· Lifestyle changes such as:   °¨ Quitting smoking.   °¨ Exercising as tolerated or as directed by your health care provider.   °¨ Controlling and maintaining a good blood pressure.   °¨ Keeping cholesterol levels under control.   °HOME CARE INSTRUCTIONS  °· Take medicines only as directed by your health care provider. Make sure you understand all your medicine instructions. Do not stop your medicines without talking to your health care provider.   °· Follow your health care provider's diet instructions. It is important to eat a healthy diet that is low in saturated fats and includes plenty of fresh fruits, vegetables, and lean meats. High-fat, high-sodium foods as well as foods that are fried, overly processed, or have poor nutritional value should be avoided. °· Maintain a healthy weight.   °· Stay physically active. It is recommended that you get at least 30 minutes of activity every day.   °· Do not use any tobacco products including cigarettes, chewing tobacco, or electronic cigarettes. If you need help quitting, ask your health care provider. °· Limit alcohol use to:   °¨ No more than 2 drinks per day for men.   °¨ No more than 1 drink per day for nonpregnant women.   °· Do not use illegal drugs.   °· Keep all follow-up visits as directed by your health   care provider.   °SEEK IMMEDIATE MEDICAL CARE IF:  °You develop TIA or stroke symptoms. These include:  °· Sudden weakness or numbness on one side of the body, such as in the face, arm, or leg.   °· Sudden confusion.    °· Trouble speaking (aphasia) or understanding.   °· Sudden trouble seeing out of one or both eyes.   °· Sudden trouble walking.   °· Dizziness or feeling like you might faint.   °· Loss of balance or coordination.   °· Sudden severe headache with no known cause.   °· Sudden trouble swallowing (dysphagia).   °If you have any of these symptoms, call your local emergency services (911 in [place] Do not drive yourself to the clinic or hospital. This is a medical emergency.  °  °This information is not intended to replace advice given to you by your health care provider. Make sure you discuss any questions you have with your health care provider. °  °Document Released: 08/09/2011 Document Revised: 06/07/2014 Document Reviewed: 11/15/2012 °Elsevier Interactive Patient Education ©2016 [employer] Inc. ° °

## 2015-05-15 DIAGNOSIS — H43813 Vitreous degeneration, bilateral: Secondary | ICD-10-CM | POA: Insufficient documentation

## 2015-05-15 DIAGNOSIS — H2513 Age-related nuclear cataract, bilateral: Secondary | ICD-10-CM | POA: Diagnosis not present

## 2015-05-15 DIAGNOSIS — H353123 Nonexudative age-related macular degeneration, left eye, advanced atrophic without subfoveal involvement: Secondary | ICD-10-CM | POA: Diagnosis not present

## 2015-05-15 DIAGNOSIS — H353112 Nonexudative age-related macular degeneration, right eye, intermediate dry stage: Secondary | ICD-10-CM | POA: Diagnosis not present

## 2015-07-07 ENCOUNTER — Other Ambulatory Visit: Payer: Medicare Other

## 2015-07-07 DIAGNOSIS — Z79899 Other long term (current) drug therapy: Secondary | ICD-10-CM

## 2015-07-07 DIAGNOSIS — E7800 Pure hypercholesterolemia, unspecified: Secondary | ICD-10-CM | POA: Diagnosis not present

## 2015-07-07 LAB — LIPID PANEL
CHOL/HDL RATIO: 2.6 ratio (ref <=5.0)
Cholesterol: 179 mg/dL (ref 125–200)
HDL: 69 mg/dL (ref >=40)
LDL Cholesterol: 83 mg/dL (ref <130)
Triglycerides: 135 mg/dL (ref <150)
VLDL: 27 mg/dL (ref <30)

## 2015-07-07 LAB — HEPATIC FUNCTION PANEL
ALBUMIN: 4.2 g/dL (ref 3.6–5.1)
ALT: 20 U/L (ref 9–46)
AST: 22 U/L (ref 10–35)
Alkaline Phosphatase: 32 U/L — ABNORMAL LOW (ref 40–115)
BILIRUBIN DIRECT: 0.1 mg/dL (ref <=0.2)
BILIRUBIN TOTAL: 0.6 mg/dL (ref 0.2–1.2)
Indirect Bilirubin: 0.5 mg/dL (ref 0.2–1.2)
Total Protein: 7.1 g/dL (ref 6.1–8.1)

## 2015-07-14 IMAGING — CT CT HEAD W/O CM
1 series · 15 of 30 positions shown, 19 images · non-contrast
Comparison: Prior head CT 10/01/2013; prior brain MRI 10/02/2013

CLINICAL DATA: 82-year-old man with confusion, weakness and fever

EXAM:
CT HEAD WITHOUT CONTRAST
TECHNIQUE: Contiguous axial images were obtained from the base of the skull
through the vertex without intravenous contrast.

[Series 2: head 5.0 h30s · axial · 0.45mm/px · z∈[-80,+70]mm · 15 of 34 slices shown, 19 images]
[im 2/34  brain]
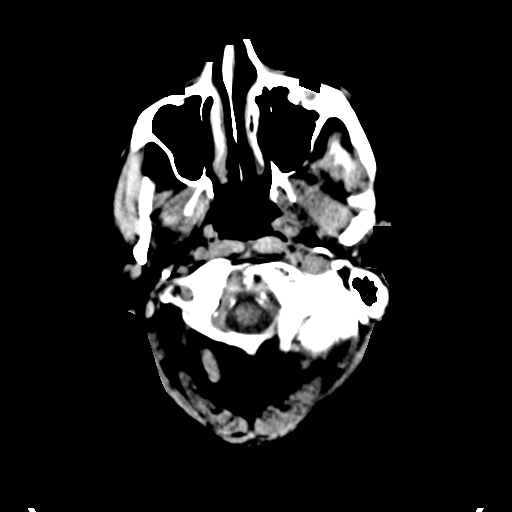
[im 2/34  bone]
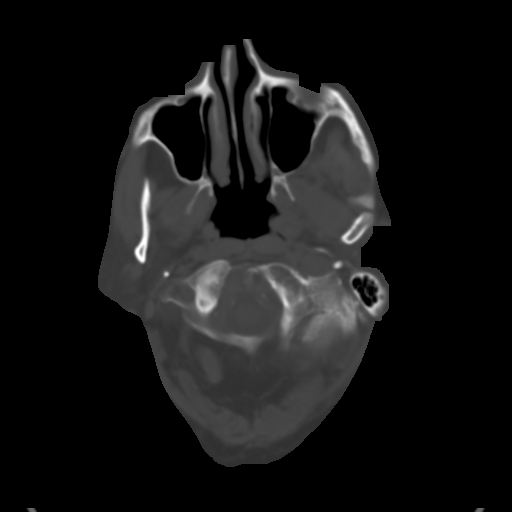
[im 4/34  brain]
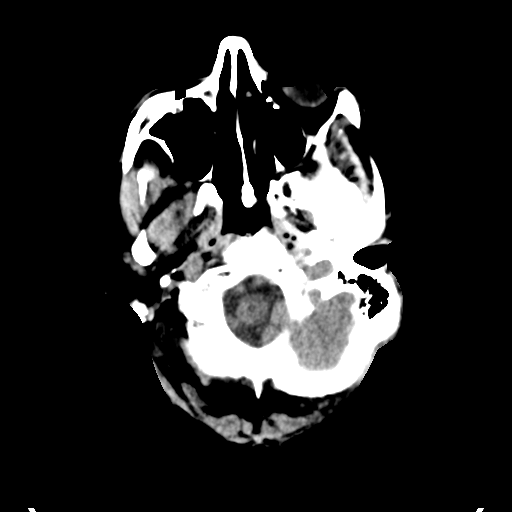
[im 6/34  brain]
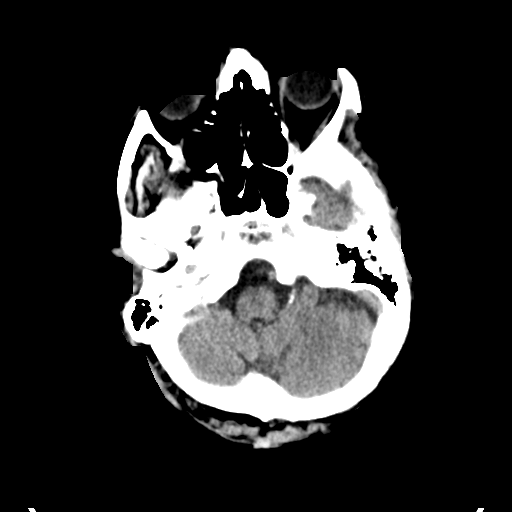
[im 8/34  brain]
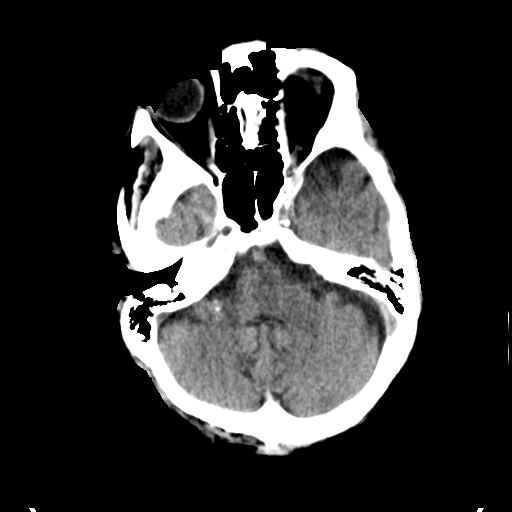
[im 11/34  brain]
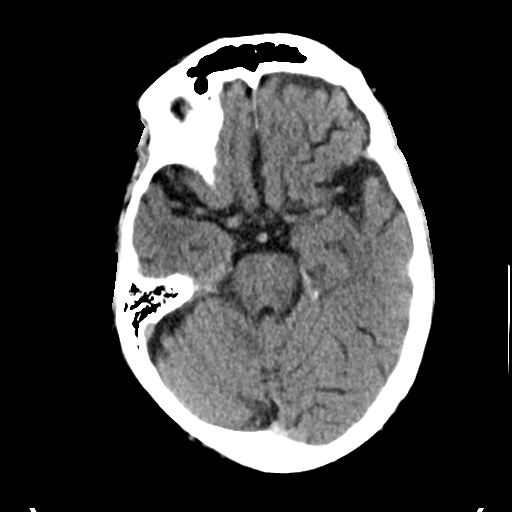
[im 11/34  bone]
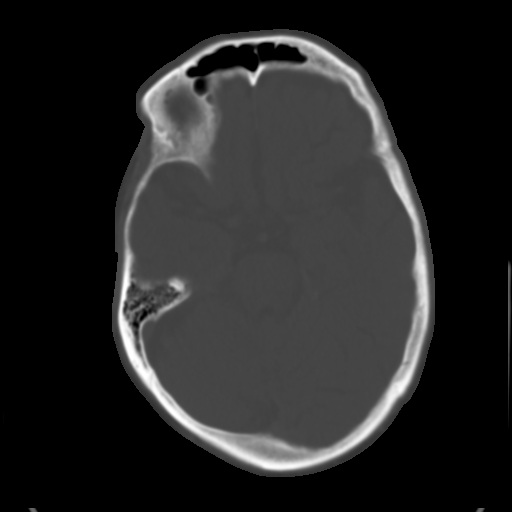
[im 13/34  brain]
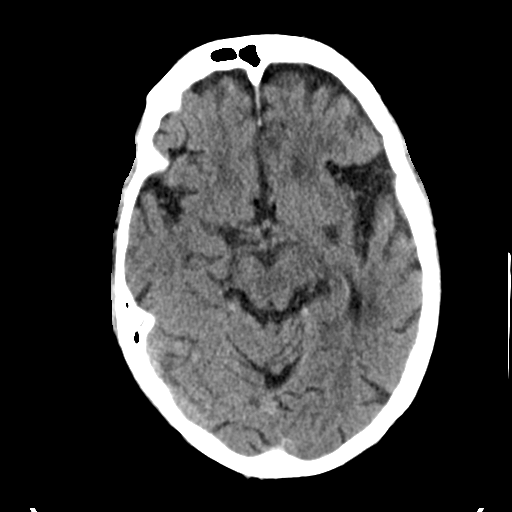
[im 15/34  brain]
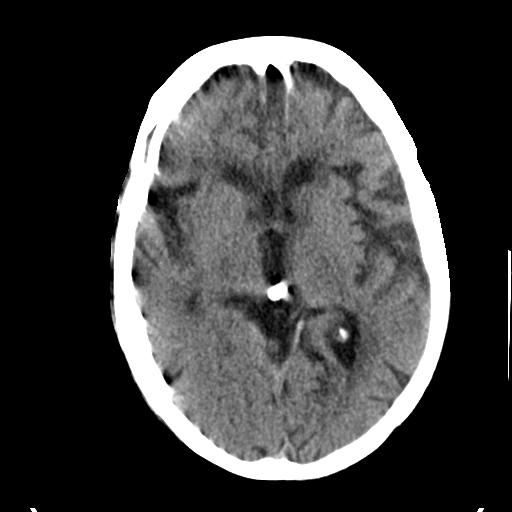
[im 18/34  brain]
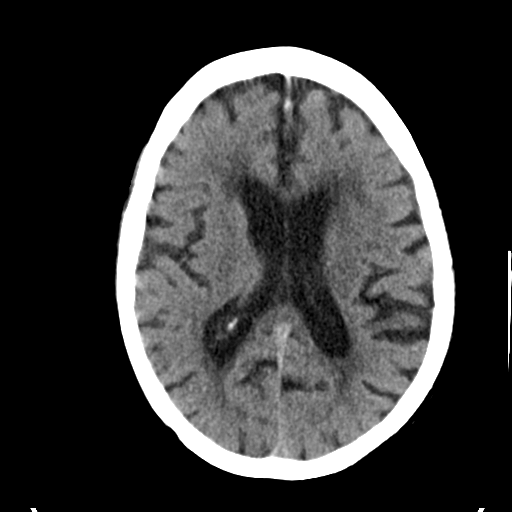
[im 19/34  brain]
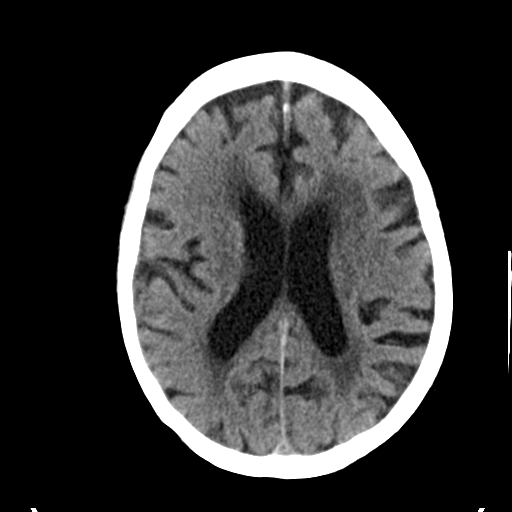
[im 19/34  bone]
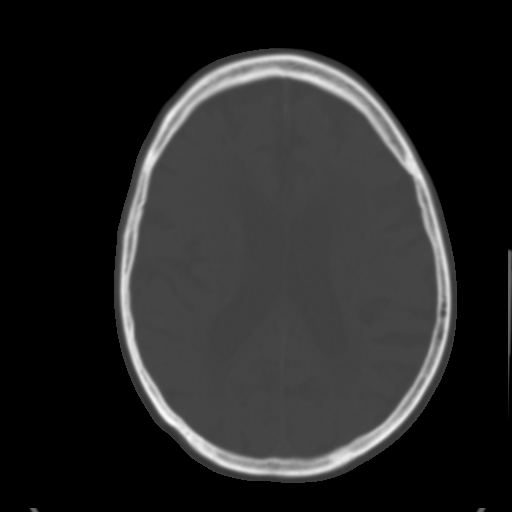
[im 21/34  brain]
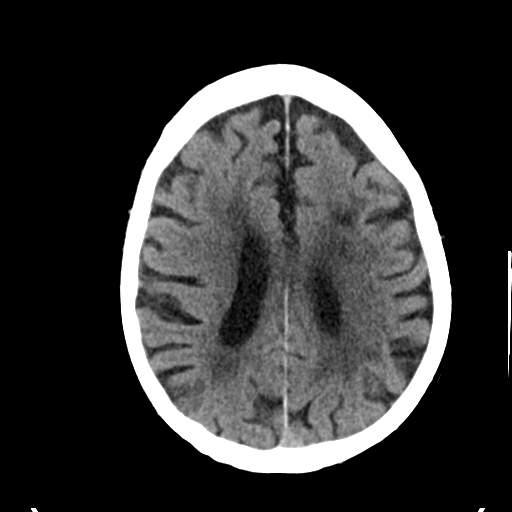
[im 23/34  brain]
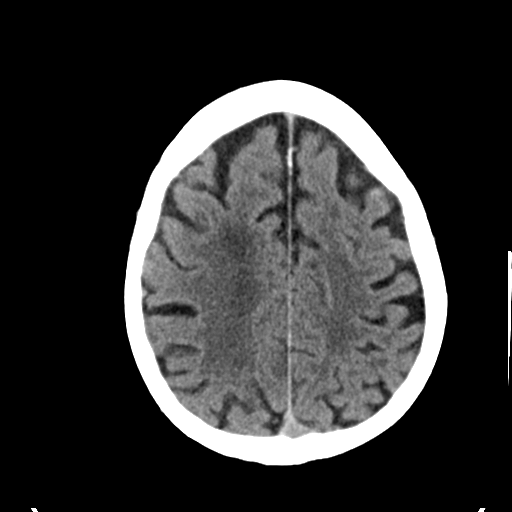
[im 26/34  brain]
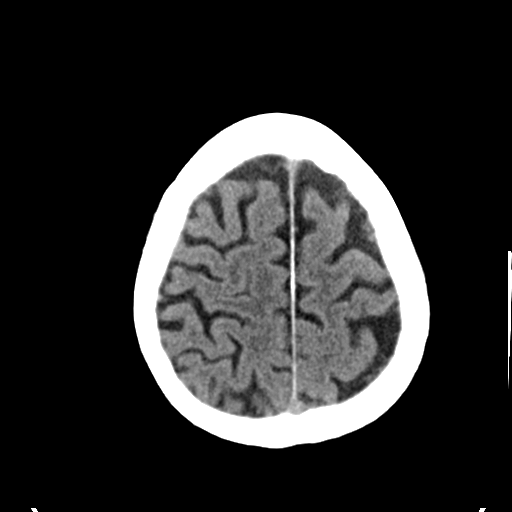
[im 28/34  brain]
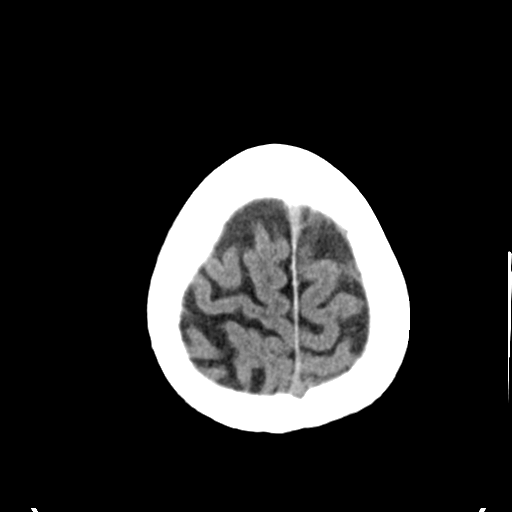
[im 28/34  bone]
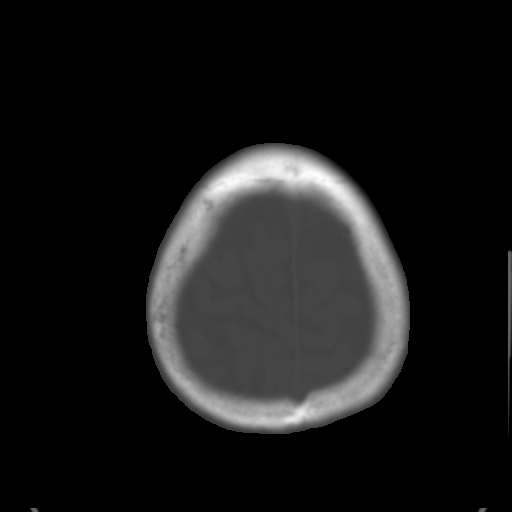
[im 30/34  brain]
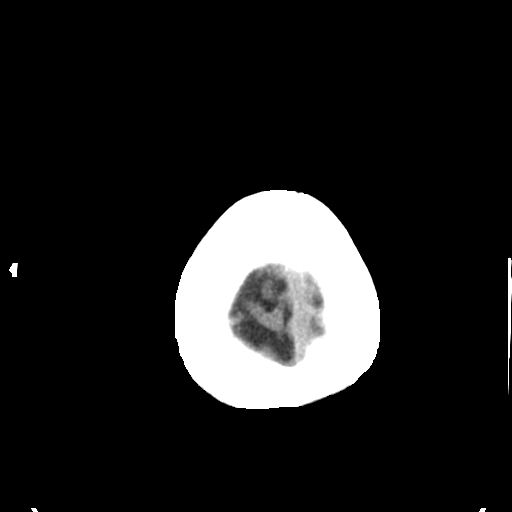
[im 32/34  brain]
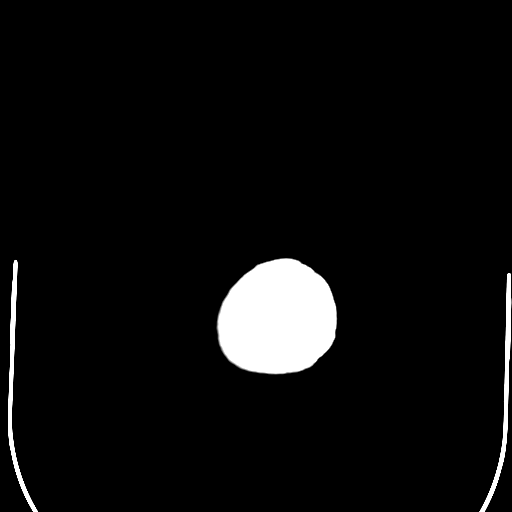

[15 of 30 positions shown; findings below may reference images not displayed]

FINDINGS: Mild image degradation secondary to motion related artifact.
Negative for acute intracranial hemorrhage, acute infarction, mass,
mass effect, hydrocephalus or midline shift. Gray-white
differentiation is preserved throughout. Stable appearance of a
moderately severe periventricular, subcortical and deep white matter
hypoattenuation most consistent with chronic microvascular ischemic
white matter disease. Cortical cerebral atrophy is also unchanged.
Mild ex vacuo ventriculomegaly. No focal soft tissue or calvarial
abnormality. Unremarkable globes and orbits. Normal aeration mastoid
air cells and paranasal sinuses.
IMPRESSION: No acute intracranial abnormality.

Stable pattern of cerebral cortical atrophy and moderately advanced
chronic microvascular ischemic white matter disease.

## 2015-07-16 ENCOUNTER — Other Ambulatory Visit: Payer: Self-pay | Admitting: *Deleted

## 2015-07-16 NOTE — Patient Outreach (Signed)
[facility]) Care Management  07/16/2015  NAYQUAN STRIMPLE 07/08/1932 [id number]   Subjective: Telephone call to patient's home number, no answer, no answering machine, and unable to leave a message.      Objective: Epic case review: Patient  has a history of carotid stenosis, diabetes, hypertension,atrial fibrillation, and acute on chronic combined systolic diastolic heart failure.  Assessment:  Received Next[employer] Tier 4 list referral on 07/10/15.    Referral states: information letter, magnet, and brochure have been mailed to patient from [place] [facility].   2 Admissions.   Diagnosis: Congestive Heart Failure.  Plan:  RNCM will call patient within 3 business days for 2nd attempt to complete telephone screening.  Omega Durante H. Annia Friendly, BSN, [place] [facility] Telephonic CM Phone: [phone] Fax: [phone]

## 2015-07-17 ENCOUNTER — Ambulatory Visit: Payer: Medicare Other | Admitting: Cardiovascular Disease

## 2015-07-17 ENCOUNTER — Ambulatory Visit: Payer: Self-pay | Admitting: *Deleted

## 2015-07-18 ENCOUNTER — Other Ambulatory Visit: Payer: Self-pay | Admitting: *Deleted

## 2015-07-18 NOTE — Patient Outreach (Signed)
[facility]) Care Management  07/18/2015  KOTA CLENDENON Oct 07, 1932 [id number]   Subjective: Telephone call to patient's home number, no answer, no answering machine, and unable to leave a message.Phone message states to enter remote access number.    Objective: Epic case review: Patient has a history of carotid stenosis, diabetes, hypertension,atrial fibrillation, and acute on chronic combined systolic diastolic heart failure.  Assessment: Received Next[employer] Tier 4 list referral on 07/10/15. Referral states: information letter, magnet, and brochure have been mailed to patient from [place] [facility]. 2 Admissions. Diagnosis: Congestive Heart Failure.  Telephone screen completion pending patient contact.   Plan: RNCM will call patient within 3 business days for 3rd attempt to complete telephone screening.  Santana Gosdin H. Annia Friendly, BSN, [place] [facility] Telephonic CM Phone: [phone] Fax: [phone]

## 2015-07-19 IMAGING — CR DG CHEST 1V PORT
1 series · 1 of 1 positions shown · non-contrast
Comparison: 11/29/2014.  11/25/2014.

CLINICAL DATA: Increasing shortness of breath. Hospital admission
for fever.

EXAM:
PORTABLE CHEST - 1 VIEW

[AP]
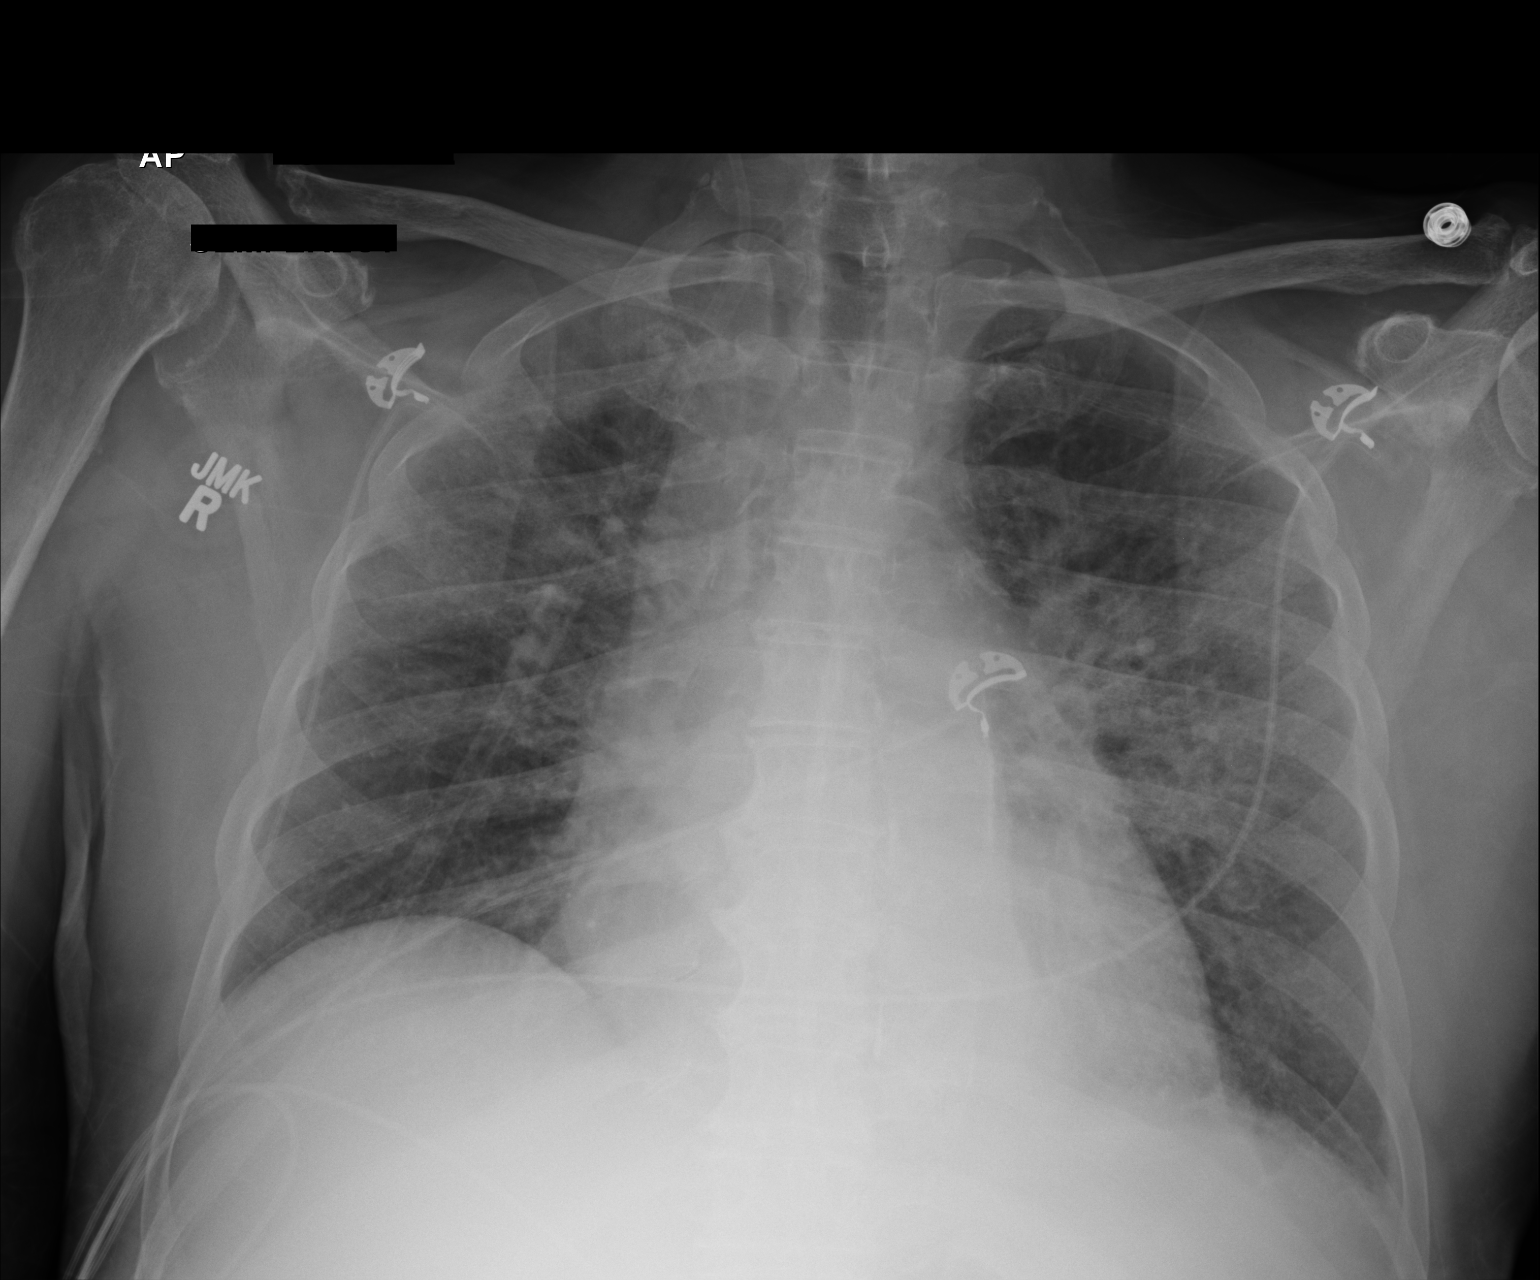

[1 of 1 positions shown; findings below may reference images not displayed]

FINDINGS: Low volume chest. This accentuates the size of the cardiopericardial
silhouette which is upper limits of normal for projection.

Monitoring leads project over the chest.

LEFT perihilar airspace disease is present. This may represent
asymmetric pulmonary edema or pneumonia.

Increased density is present over the RIGHT scapula, favored to be
due to bony overlap rather than airspace disease.
IMPRESSION: New LEFT mid lung airspace disease around the LEFT hilum suspicious
for pneumonia. Asymmetric/ atypical pulmonary edema less likely.
Followup PA and lateral chest X-ray is recommended in 3-4 weeks
following trial of antibiotic therapy to ensure resolution and
exclude underlying malignancy.

## 2015-07-21 ENCOUNTER — Encounter: Payer: Self-pay | Admitting: *Deleted

## 2015-07-21 ENCOUNTER — Other Ambulatory Visit: Payer: Self-pay | Admitting: *Deleted

## 2015-07-21 NOTE — Patient Outreach (Addendum)
[facility]) Care Management  07/21/2015  Jeffrey [name] 05-16-1933 [id number]  Subjective: Telephone call to patient's home number, spoke with patient, and HIPAA verified.   Patient states he is doing well.  Discussed [facility] Care Management services and states he does not have any care management needs at this time. Patient states he has several medical professionals in his family (pharmacist and nurses) and has all the resources he needs.   Patient is in agreement to receive [facility] successful outreach letter, magnet and brochure.  States he does not recall receiving information regarding [facility] Care Management in the past.   Patient states he is able to afford his medications, has no transportation, healthcare provider issues, community resource needs, or disease education needs at this time.     Patient states he will contact [place] if any care coordination or care management needs arise in the future.    Objective: Epic case review: Patient has a history of carotid stenosis, diabetes, hypertension,atrial fibrillation, and acute on chronic combined systolic diastolic heart failure.  Assessment: Received Next[employer] Tier 4 list referral on 07/10/15. Referral states: information letter, magnet, and brochure have been mailed to patient from [facility] [facility]. 2 Admissions. Diagnosis: Congestive Heart Failure. Patient has declined [place] [facility] services due to no care management services needs at this time.     Plan: RNCM will send patient [place] [facility] successful outreach letter, magnet and brochure, per patient's request. RNCM will send patient's primary MD case closure letter due to refusal and no care management needs. RNCM will send case closure request due to refusal of service and no care management needs to [place] [facility] [place] [facility].    Dessie Tatem H. Annia Friendly, BSN, [place] [facility] Telephonic CM Phone:  [phone] Fax: [phone]

## 2015-07-24 ENCOUNTER — Other Ambulatory Visit: Payer: Self-pay | Admitting: Family Medicine

## 2015-07-28 DIAGNOSIS — C61 Malignant neoplasm of prostate: Secondary | ICD-10-CM | POA: Diagnosis not present

## 2015-07-28 DIAGNOSIS — K627 Radiation proctitis: Secondary | ICD-10-CM | POA: Diagnosis not present

## 2015-07-28 DIAGNOSIS — Z8546 Personal history of malignant neoplasm of prostate: Secondary | ICD-10-CM | POA: Diagnosis not present

## 2015-07-29 ENCOUNTER — Other Ambulatory Visit: Payer: Self-pay | Admitting: Family Medicine

## 2015-07-29 NOTE — Telephone Encounter (Signed)
Pt has an appt in may °

## 2015-07-30 ENCOUNTER — Ambulatory Visit (INDEPENDENT_AMBULATORY_CARE_PROVIDER_SITE_OTHER): Payer: Medicare Other | Admitting: Neurology

## 2015-07-30 ENCOUNTER — Encounter: Payer: Self-pay | Admitting: Neurology

## 2015-07-30 VITALS — BP 193/90 | HR 72 | Ht 70.0 in | Wt 173.0 lb

## 2015-07-30 DIAGNOSIS — Z8619 Personal history of other infectious and parasitic diseases: Secondary | ICD-10-CM | POA: Diagnosis not present

## 2015-07-30 NOTE — Progress Notes (Addendum)
[facility] [address] [place]. [place] [address] [phone]       OFFICE FOLLOW-UP NOTE  [name] Date of Birth:  April 16, 1933 Medical Record Number:  [id number]   HPI: 82 year male seen for first follow up visit after [facility] admission for stroke on 11/27/14.Jeffrey [name] is an 82 y.o. male with hx of TIA, DM, prostate CA presented with fever and altered mental status since Saturday. Family notes difficulty speaking, trouble getting his words out. They also note some possible right sided weakness. Deny any recent medication changes, no tick exposure, no sick contacts. Denies a headache. At baseline they report he is active and independent. CT head on admission showed no acute abnormality but MRI scan showed an acute nonhemorrhagic white matter infarct in the right anterior frontal lobe. There are also remote lacunar infarcts noted in anterior left corona radiata. Carotid ultrasound suggested 80-90% left ICA stenosis which was felt to be asymptomatic. Transthoracic echo showed slightly reduced ejection fraction of 40-45% but no cardiac source for embolism. Hemoglobin A1c was 6.3. Patient at present without mental status and hence had a spinal tap which was negative for infectious etiology. [facility] spotted fever IgM was elevated at 1.88 but IgG was negative. He was treated with 2 week course of doxycycline. Patient states he is doing well is finished outpatient physical occupational therapy. His physical strength has improved but he does complain of poor stamina which seems to be improving slowly. He reports multiple episodes of brief staring blankly and being unresponsive for a few minutes. His last bili 30 seconds or so. It takes a little while to recover from these episodes. He was admitted to [facility] on May 15 for this episode had EEG at that time which was negative. He was seen by Dr.Aquino as an outpatient in July and had negative workup.  Patient was referred to Dr. Ruta Hinds vascular surgeon for evaluation for left carotid stenosis but apparently had an ultrasound in his office which showed the stenosis to be much less and hence conservative follow-up has been recommended for now. He had outpatient cardiac nuclear stress test which was negative. He is tolerating aspirin well without any side effects. Update 07/30/15 : He returns for follow-up after last visit 6 months ago. Is accompanied by his wife. He states his done well his had no further episodes of staring or seizure-like activity. Is no TIA strokes either. He had follow-up carotid ultrasound done on 05/01/15 and [name] office which showed stable 60-80% left ICA stenosis and conservative medical follow-up has been recommended. Patient had an episode of [facility] spotted fever in November and subsequently had a transient episode of paroxysmal atrial fibrillation. He has been started on eliquis by his cardiologist [name]. He does complain of minor bruising with scratching but no bleeding episodes. States his blood pressure at home ranges from [phone] and he does have whitecoat hypertension and today it is elevated at 1193/90 in office. He has no other new complaints. He had an EEG done in September which showed mild generalized slowing without definite epileptiform activity ROS:   14 system review of systems is positive for  flushing, bladder incontinence, easy bruising, memory loss, snoring, sleep talking, itching tremors and all other systems negative  PMH:  Past Medical History  Diagnosis Date  . Hypertension   . Hypercholesteremia   . Impaired fasting glucose   . Rotator cuff tear, right 10/2010    supraspinatous  and infraspinatous  . Diabetes mellitus   . Macular degeneration   . Heart murmur     mild aortic stenosis  . Radiation proctitis 11/2012    treated with APC ablation and Canasa suppositories (Dr. Hilarie Fredrickson)  . TIA (transient ischemic attack) 10/02/13     hosp at [facility] x 1 night  . 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 Aortic atherosclerosis ([place]) 11/25/2014  . Acute encephalopathy 11/27/2014  . [facility] spotted fever) 10/2014    (hosp with FUO, encephalopathy)  . Multiple lacunar infarcts ([place])   . Acute CVA (cerebrovascular accident) ([place])   . Prostate cancer ([place]) radiation + seeding implant (Dr.[name])  . Acute on chronic combined systolic and diastolic CHF (congestive heart failure) ([place])   . Atrial fibrillation [facility])     Social History:  Social History   Social History  . Marital Status: Married    Spouse Name: N/A  . Number of Children: 4  . Years of Education: N/A   Occupational History  . Retired    Social History Main Topics  . Smoking status: Former Smoker -- 1.00 packs/day for 20 years    Types: Cigarettes    Quit date: 05/31/1980  . Smokeless tobacco: Never Used  . Alcohol Use: 4.2 oz/week    7 Cans of beer per week     Comment: can of beer daily  . Drug Use: No  . Sexual Activity:    Partners: Female   Other Topics Concern  . Not on file   Social History Narrative   Retired Engineer, structural.  2 sons, 2 daughters (all in [place]), 15 grandchildren, 1 great grandchild    Medications:   Current Outpatient Prescriptions on File Prior to Visit  Medication Sig Dispense Refill  . acetaminophen (TYLENOL ARTHRITIS PAIN) 650 MG CR tablet Take 1 tablet (650 mg total) by mouth every 8 (eight) hours as needed.    [name] apixaban (ELIQUIS) 5 MG TABS tablet Take 1 tablet (5 mg total) by mouth 2 (two) times daily. 60 tablet 5  . hydrocortisone cream 1 % Apply 1 application topically as needed (for ankles).    . lovastatin (MEVACOR) 40 MG tablet TAKE TWO TABLETS BY MOUTH AT BEDTIME 180 tablet 0  . metoprolol succinate (TOPROL-XL) 50 MG 24 hr tablet TAKE ONE TABLET TWICE DAILY WITH OR IMMEDIATELY FOLLOWING A MEAL 180 tablet 0  . Multiple Vitamins-Minerals (PRESERVISION AREDS) CAPS  Take 2 capsules by mouth every morning.      No current facility-administered medications on file prior to visit.    Allergies:  No Known Allergies  Physical Exam General: well developed, well nourished elderly male, seated, in no evident distress Head: head normocephalic and atraumatic.  Neck: supple with soft left carotid but no supraclavicular bruits Cardiovascular: regular rate and rhythm, Harsh ejection systolic murmur Musculoskeletal: no deformity Skin:  no rash/petichiae Vascular:  Normal pulses all extremities Filed Vitals:   07/30/15 0819  BP: 193/90  Pulse: 72   Neurologic Exam Mental Status: Awake and fully alert. Oriented to place and time. Recent and remote memory intact. Attention span, concentration and fund of knowledge appropriate. Mood and affect appropriate.  Cranial Nerves: Fundoscopic exam reveals sharp disc margins. Pupils equal, briskly reactive to light. Extraocular movements full without nystagmus. Visual fields full to confrontation. Hearing intact. Facial sensation intact. Face, tongue, palate moves normally and symmetrically.  Motor: Normal bulk and tone. Normal strength in all tested extremity muscles. Mild diminished  fine finger movements on the left. Orbits right over left upper extremity. Sensory.: intact to touch ,pinprick .position and vibratory sensation.  Coordination: Rapid alternating movements normal in all extremities. Finger-to-nose and heel-to-shin performed accurately bilaterally. Gait and Station: Arises from chair without difficulty. Stance is normal. Gait demonstrates normal stride length and balance . Able to heel, toe and tandem walk without difficulty.  Reflexes: 1+ and symmetric. Toes downgoing.      ASSESSMENT: 82 year male with right frontal white matter infarct in June 2016 secondary to small vessel disease..Asymptomatic proximal moderate left ICA stenosis. History of transient episodes of staring with abnormal EEG showing focal  slowing but without definite epileptiform activity    PLAN: I had a long discussion with the patient and his wife regarding his left carotid stenosis which appears to be stable and asymptomatic and agree with continuing conservative follow-up with six-month the carotid Dopplers and follow up with vascular surgeon [name]. Continue Eliquis for history of paroxysmal atrial fibrillation and stroke prevention. Maintain strict control of hypertension with blood pressure goal below 130/90, lipids with LDL cholesterol goal below 70 mg percent. Patient has had multiple EEGs which have shown focal temporal slowing but without definite seizure activity hence agree with not using anticonvulsants especially since he's been episode free now for several months. Greater than 50% time during this 25 minute visit was spent on counseling and coordination of care. He will return for follow-up in the future only if necessary and no routine follow-up appointment was made. Antony Contras, MD  Note: This document was prepared with digital dictation and possible smart phrase technology. Any transcriptional errors that result from this process are unintentional

## 2015-07-30 NOTE — Patient Instructions (Signed)
I had a long discussion with the patient and his wife regarding his left carotid stenosis which appears to be stable and asymptomatic and agree with continuing conservative follow-up with six-month the carotid Dopplers and follow up with vascular surgeon [name]. Continue Eliquis for history of paroxysmal atrial fibrillation and stroke prevention. Patient has had multiple EEGs which have shown focal temporal slowing but without definite seizure activity hence agree with not using anticonvulsants especially since he's been episode free now for several months. She will return for follow-up in the future only if necessary and no routine follow-up appointment was made.

## 2015-08-01 ENCOUNTER — Encounter: Payer: Self-pay | Admitting: Family Medicine

## 2015-08-08 ENCOUNTER — Encounter: Payer: Self-pay | Admitting: *Deleted

## 2015-08-11 ENCOUNTER — Ambulatory Visit (INDEPENDENT_AMBULATORY_CARE_PROVIDER_SITE_OTHER): Payer: Medicare Other | Admitting: Cardiovascular Disease

## 2015-08-11 VITALS — BP 160/88 | HR 70 | Wt 175.0 lb

## 2015-08-11 DIAGNOSIS — I350 Nonrheumatic aortic (valve) stenosis: Secondary | ICD-10-CM | POA: Diagnosis not present

## 2015-08-11 DIAGNOSIS — I480 Paroxysmal atrial fibrillation: Secondary | ICD-10-CM

## 2015-08-11 MED ORDER — LISINOPRIL 10 MG PO TABS: 10.0000 mg | ORAL_TABLET | Freq: Every day | ORAL | Status: DC

## 2015-08-11 NOTE — Patient Instructions (Addendum)
Medication Instructions:  Your physician has recommended you make the following change in your medication:  START taking lisinopril 10mg  daily   Labwork: none  Testing/Procedures: none  Follow-Up: Your physician wants you to follow-up in: six months with [name].  You will receive a reminder letter in the mail two months in advance. If you don't receive a letter, please call our office to schedule the follow-up appointment.   Any Other Special Instructions Will Be Listed Below (If Applicable).     If you need a refill on your cardiac medications before your next appointment, please call your pharmacy.  me

## 2015-08-11 NOTE — Progress Notes (Signed)
Cardiology Office Note   Date:  08/11/2015   ID:  Jeffrey [name], DOB March 07, 1933, MRN [id number]  PCP:  Vikki Ports, MD  Cardiologist:   Kathlyn Sacramento, MD   No chief complaint on file.     History of Present Illness: Jeffrey [name] is a 82 y.o. male who presents for a follow-up visit regarding paroxysmal atrial fibrillation.  He has known history of hypertension, hyperlipidemia, TIA, mild aortic stenosis, moderate left carotid stenosis and borderline diabetes mellitus. He had previous stroke in June 2016 and later was found to have [name] Fever which was treated with doxycycline.  He was diagnosed with atrial fibrillation with mildly elevated troponin 2016.  Echocardiogram on 11/28/14 showed mild to moderately reduced LV systolic function, EF [id number], diffuse hypokinesis with severe apical hypokinesis, grade I diastolic dysfunction, moderately thickened and calcified aortic valve leaflets with mild stenosis, mild mitral regurgitation, mild left atrial dilatation, mildly reduced RV systolic function, mild pulmonary hypertension, with no definitive vegetations seen.  He underwent a nuclear stress test in August of 2016 which showed no evidence of ischemia with normal ejection fraction.  He has been doing well and denies any chest pain or shortness of breath. His blood pressure continues to be elevated. He used to be on lisinopril 20 mg twice daily last year but that caused his blood pressure to go down and thus it was discontinued. At some point also he was on amlodipine but currently he is only on Toprol. He reports no side effects with anticoagulation.   Past Medical History  Diagnosis Date  . Hypertension   . Hypercholesteremia   . Impaired fasting glucose   . Rotator cuff tear, right 10/2010    supraspinatous and infraspinatous  . Diabetes mellitus   . Macular degeneration   . Heart murmur     mild aortic stenosis  . Radiation proctitis 11/2012    treated with  APC ablation and Canasa suppositories (Dr. Hilarie Fredrickson)  . TIA (transient ischemic attack) 10/02/13    hosp at [facility] x 1 night  . 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 Aortic atherosclerosis ([place]) 11/25/2014  . Acute encephalopathy 11/27/2014  . RMSF [facility] spotted fever) 10/2014    (hosp with FUO, encephalopathy)  . Multiple lacunar infarcts ([place])   . Acute CVA (cerebrovascular accident) ([place])   . Prostate cancer ([place]) radiation + seeding implant (Dr.[name])  . Acute on chronic combined systolic and diastolic CHF (congestive heart failure) ([place])   . Atrial fibrillation [facility])     Past Surgical History  Procedure Laterality Date  . Tonsillectomy and adenoidectomy  age 36  . Prostate seed implant    . Rotator cuff repair  06/16/2011    right (Dr. Berenice Primas)  . Colonoscopy N/A 11/28/2012    Procedure: COLONOSCOPY;  Surgeon: Jerene Bears, MD;  Location: [facility] ENDOSCOPY;  Service: Gastroenterology;  Laterality: N/A;     Current Outpatient Prescriptions  Medication Sig Dispense Refill  . acetaminophen (TYLENOL ARTHRITIS PAIN) 650 MG CR tablet Take 1 tablet (650 mg total) by mouth every 8 (eight) hours as needed.    [name] apixaban (ELIQUIS) 5 MG TABS tablet Take 1 tablet (5 mg total) by mouth 2 (two) times daily. 60 tablet 5  . hydrocortisone cream 1 % Apply 1 application topically as needed (for ankles).    [name] lisinopril (PRINIVIL,ZESTRIL) 10 MG tablet Take 1 tablet (10 mg total) by mouth daily. 30 tablet 6  .  lovastatin (MEVACOR) 40 MG tablet TAKE TWO TABLETS BY MOUTH AT BEDTIME 180 tablet 0  . metoprolol succinate (TOPROL-XL) 50 MG 24 hr tablet TAKE ONE TABLET TWICE DAILY WITH OR IMMEDIATELY FOLLOWING A MEAL 180 tablet 0  . Multiple Vitamins-Minerals (PRESERVISION AREDS) CAPS Take 2 capsules by mouth every morning.      No current facility-administered medications for this visit.    Allergies:   Review of patient's allergies indicates no known allergies.     Social History:  The patient  reports that he quit smoking about 35 years ago. His smoking use included Cigarettes. He has a 20 pack-year smoking history. He has never used smokeless tobacco. He reports that he drinks about 4.2 oz of alcohol per week. He reports that he does not use illicit drugs.   Family History:  The patient's family history includes Cancer in his daughter; Dementia in his mother; Diabetes in his daughter and mother; Heart disease in his mother and son; Heart disease (age of onset: 110) in his father; Hypertension in his mother; Stroke in his sister.    ROS:  Please see the history of present illness.   Otherwise, review of systems are positive for none.   All other systems are reviewed and negative.    PHYSICAL EXAM: VS:  BP 160/88 mmHg  Pulse 70  Wt 175 lb (79.379 kg) , BMI Body mass index is 25.11 kg/(m^2). GEN: Well nourished, well developed, in no acute distress HEENT: normal Neck: no JVD, carotid bruits, or masses Cardiac: Regular with premature beats; no rubs, or gallops,no edema . There is a 3/6 crescendo decrescendo murmur in the aortic area which is mid peaking with slightly diminished S2 Respiratory:  clear to auscultation bilaterally, normal work of breathing GI: soft, nontender, nondistended, + BS MS: no deformity or atrophy Skin: warm and dry, no rash Neuro:  Strength and sensation are intact Psych: euthymic mood, full affect   EKG:  EKG is ordered today. The ekg ordered today demonstrates normal sinus rhythm with frequent PACs. Mildly prolonged QT interval.   Recent Labs: 10/17/2014: TSH 1.409 11/30/2014: Magnesium 2.0 04/28/2015: BUN 17; Creat 0.92; Hemoglobin 14.6; Platelets 327; Potassium 4.0; Sodium 137 07/07/2015: ALT 20    Lipid Panel    Component Value Date/Time   CHOL 179 07/07/2015 0001   CHOL 184 10/01/2013 1719   TRIG 135 07/07/2015 0001   TRIG 400* 10/01/2013 1719   HDL 69 07/07/2015 0001   HDL 55 10/01/2013 1719   CHOLHDL 2.6  07/07/2015 0001   VLDL 27 07/07/2015 0001   VLDL 80* 10/01/2013 1719   LDLCALC 83 07/07/2015 0001   LDLCALC 49 10/01/2013 1719      Wt Readings from Last 3 Encounters:  08/11/15 175 lb (79.379 kg)  07/30/15 173 lb (78.472 kg)  05/01/15 168 lb 3.2 oz (76.295 kg)      Other studies Reviewed: Additional studies/ records that were reviewed today include: Recent office notes. Review of the above records demonstrates: Persistently elevated blood pressure   ASSESSMENT AND PLAN:  1.  Paroxysmal atrial fibrillation: Patient is in sinus rhythm today with PACs. He is tolerating anticoagulation with no reported side effects. I reviewed his labs from November which overall were unremarkable with normal CBC and kidney function.  2. Essential hypertension: Blood pressure has been significantly elevated recently. He reports that his blood pressure is always higher when he is at the doctor's office. Nonetheless, he frequently has blood pressure readings above 150 at home. Thus,  I resumed lisinopril but at a smaller dose of 10 mg once daily.  3.  Aortic valve stenosis: This was mild on echocardiogram in 2016.  4. Hyperlipidemia: Continue treatment with lovastatin. Lipid profile from February was reviewed which showed an LDL of 83.   5. Left carotid stenosis: This was moderate at 60-79% on most recent Doppler last year. This is being followed by [name].      Disposition:   FU with me in 6 months  Signed,  Kathlyn Sacramento, MD  08/11/2015 12:42 PM    [place]

## 2015-08-22 ENCOUNTER — Other Ambulatory Visit: Payer: Self-pay | Admitting: Family Medicine

## 2015-09-19 ENCOUNTER — Telehealth: Payer: Self-pay | Admitting: Cardiovascular Disease

## 2015-09-19 NOTE — Telephone Encounter (Signed)
Faxed clearance to [employer] and [employer], [phone]

## 2015-10-29 ENCOUNTER — Ambulatory Visit (INDEPENDENT_AMBULATORY_CARE_PROVIDER_SITE_OTHER): Payer: Medicare Other | Admitting: Family Medicine

## 2015-10-29 ENCOUNTER — Encounter: Payer: Self-pay | Admitting: Family Medicine

## 2015-10-29 VITALS — BP 208/106 | HR 72 | Ht 70.0 in | Wt 173.6 lb

## 2015-10-29 DIAGNOSIS — I10 Essential (primary) hypertension: Secondary | ICD-10-CM

## 2015-10-29 DIAGNOSIS — E119 Type 2 diabetes mellitus without complications: Secondary | ICD-10-CM | POA: Diagnosis not present

## 2015-10-29 DIAGNOSIS — E7800 Pure hypercholesterolemia, unspecified: Secondary | ICD-10-CM | POA: Diagnosis not present

## 2015-10-29 DIAGNOSIS — Z7901 Long term (current) use of anticoagulants: Secondary | ICD-10-CM

## 2015-10-29 DIAGNOSIS — I6522 Occlusion and stenosis of left carotid artery: Secondary | ICD-10-CM

## 2015-10-29 DIAGNOSIS — I700 Atherosclerosis of aorta: Secondary | ICD-10-CM | POA: Diagnosis not present

## 2015-10-29 DIAGNOSIS — R809 Proteinuria, unspecified: Secondary | ICD-10-CM

## 2015-10-29 DIAGNOSIS — I480 Paroxysmal atrial fibrillation: Secondary | ICD-10-CM

## 2015-10-29 DIAGNOSIS — E1129 Type 2 diabetes mellitus with other diabetic kidney complication: Secondary | ICD-10-CM

## 2015-10-29 LAB — CBC WITH DIFFERENTIAL/PLATELET
BASOS PCT: 1 %
Basophils Absolute: 82 cells/uL (ref 0–200)
EOS ABS: 82 {cells}/uL (ref 15–500)
Eosinophils Relative: 1 %
HCT: 40.3 % (ref 38.5–50.0)
Hemoglobin: 13 g/dL — ABNORMAL LOW (ref 13.2–17.1)
LYMPHS ABS: 2378 {cells}/uL (ref 850–3900)
Lymphocytes Relative: 29 %
MCH: 31.6 pg (ref 27.0–33.0)
MCHC: 32.3 g/dL (ref 32.0–36.0)
MCV: 97.8 fL (ref 80.0–100.0)
MONO ABS: 902 {cells}/uL (ref 200–950)
MPV: 9.8 fL (ref 7.5–12.5)
Monocytes Relative: 11 %
NEUTROS PCT: 58 %
Neutro Abs: 4756 cells/uL (ref 1500–7800)
PLATELETS: 292 10*3/uL (ref 140–400)
RBC: 4.12 MIL/uL — AB (ref 4.20–5.80)
RDW: 13.6 % (ref 11.0–15.0)
WBC: 8.2 10*3/uL (ref 4.0–10.5)

## 2015-10-29 LAB — TSH: TSH: 1.08 mIU/L (ref 0.40–4.50)

## 2015-10-29 LAB — POCT GLYCOSYLATED HEMOGLOBIN (HGB A1C): Hemoglobin A1C: 6.4

## 2015-10-29 MED ORDER — LOVASTATIN 40 MG PO TABS: 80.0000 mg | ORAL_TABLET | Freq: Every day | ORAL | Status: DC

## 2015-10-29 NOTE — Patient Instructions (Signed)
Please go to the pharmacy with the prescription that was given to you. I recommend that you get the Tdap (tetanus and pertussis vaccine), along with the shingles vaccine. Please let us know (phone call or MyChart message) when you get these vaccines, so we can enter them into your record.  Continue your current medications. Try and get some form of aerobic exercise (ie walking in place in front of the TV) for at least 30 minutes (total--can be in 10 minutes intervals, if needed) on the days you can't get outside.

## 2015-10-29 NOTE — Progress Notes (Signed)
Chief Complaint  Patient presents with  . Hypertension    non fasting med check.    Hypertension:  Home BP's have been ranging as low as 103/53 (and felt fine, no dizziness) up to 169/78 when rainy and didn't get any exercise or yardwork.  Yesterday was 180/77, taking his wife to doctors, driving a lot.  Denies headaches, lightheadedness (slightly swimmy feeling if BP <100, hasn't happened recently). Denies chest pain, palpitations, shortness of breath, edema.  Reports compliance with his medications.  He saw his cardiologist last 2 months ago. He restarted his lisinopril at a smaller dose of 10mg  daily (previously had been on 20mg  BID at one point).  Paroxysmal atrial fibrillation--Cardiologist recommends lifelong anticoagulation.  Patient denies any bleeding/bruising or side effects from medication, other than having an episode of bleeding a few days after tooth extraction.  DM/IFG--He doesn't check his sugars. He goes to the eye doctor every six months. He denies any feet concerns, numbness/tingling. He has urinary frequency x several years (related to prostate), no change; denies polydipsia. Drinking 1-2 beers/day.  Exercise: yardwork only.  Can no longer exercise with his wife (used to walk in the neighborhood) due to her knee problems--they are working on fixing this.   Hyperlipidemia: Compliant with medications; denies side effects. He broke his tooth eating fried food. He cut back overall on the amount of fried foods.  He had lipids checked after increasing lovastatin dose to 80mg : Lab Results  Component Value Date   CHOL 179 07/07/2015   HDL 69 07/07/2015   LDLCALC 83 07/07/2015   TRIG 135 07/07/2015   CHOLHDL 2.6 07/07/2015    He saw his neurologist in March as well (right frontal white matter infarct in June 2016 secondary to small vessel disease).  He has asymptomatic L carotid artery stenosis, and is monitored by [name] for this.  He has appointment schedule within the  next month or so. He denies any neurologic symptoms.  PMH, PSH, SH reviewed  Outpatient Encounter Prescriptions as of 10/29/2015  Medication Sig Note  . acetaminophen (TYLENOL ARTHRITIS PAIN) 650 MG CR tablet Take 1 tablet (650 mg total) by mouth every 8 (eight) hours as needed. 04/28/2015: 2 qam and 2 qpm  . apixaban (ELIQUIS) 5 MG TABS tablet Take 1 tablet (5 mg total) by mouth 2 (two) times daily.   [name] lisinopril (PRINIVIL,ZESTRIL) 10 MG tablet Take 1 tablet (10 mg total) by mouth daily.   [name] lovastatin (MEVACOR) 40 MG tablet Take 2 tablets (80 mg total) by mouth at bedtime.   . metoprolol succinate (TOPROL-XL) 50 MG 24 hr tablet TAKE ONE TABLET BY MOUTH TWICE DAILY WITH OR IMMEDIATELY FOLLOWING A MEAL   . Multiple Vitamins-Minerals (PRESERVISION AREDS) CAPS Take 2 capsules by mouth every morning.    . [DISCONTINUED] lovastatin (MEVACOR) 40 MG tablet TAKE TWO TABLETS BY MOUTH AT BEDTIME   . hydrocortisone cream 1 % Apply 1 application topically as needed (for ankles). Reported on 10/29/2015 12/23/2014: Uses prn for rash on ankles   No facility-administered encounter medications on file as of 10/29/2015.   No Known Allergies  ROS: no fever, chills, anorexia, weight changes, URI symptoms, cough, shortness of breath, headaches, dizziness, chest pain, palpitations, shortness of breath, GI or GU complaints (just urinary frequency).  Denies rash, recent bleeding/bruising (other than related to dental work).  No depression or other complaints. See HPI.  PHYSICAL EXAM: BP 210/110 mmHg  Pulse 72  Ht 5\' 10"  (1.778 m)  Wt 173  lb 9.6 oz (78.744 kg)  BMI 24.91 kg/m2 208/106 on repeat by nurse.  Well developed, pleasant male, in no distress.  Left the exam room 2-3 times to go to the bathroom.  He appears only mildly anxious HEENT: PERRL, EOMI, conjunctiva and sclera are clear Neck: no lymphadenopathy or mass. No bruit (slight radiation of murmur noted in carotids bilaterally) Heart: Harsh SEM, heard  throughout; regular rate and rhythm Lungs: clear bilaterally Back: no CVA or spinal tenderness Chest: nontender, no mass Abdomen: soft, nontender, no organomegaly or mass Extremities: no edema, 2+ pulses, normal sensation. Normal diabetic foot exam. Neuro: alert and oriented. Normal cranial nerves, gait, strength Psych: normal mood, affect, hygiene and grooming.  Slightly excitable/anxious Skin: no bruising or lesions noted, no rash   Lab Results  Component Value Date   HGBA1C 6.4 10/29/2015   ASSESSMENT/PLAN:  Type 2 diabetes mellitus with microalbuminuria or microproteinuria - DM controlled; he is now back on ACEI  Diabetes mellitus without complication (HCC) - Plan: HgB A1c  Type 2 diabetes mellitus without complication, without long-term current use of insulin (HCC) - Plan: TSH  Left carotid artery stenosis - f/u as scheduled with vascular for f/u  Paroxysmal atrial fibrillation (HCC) - NSR today. continue Eliquis  Pure hypercholesterolemia - lipids at goal per last check; continue lovastatin - Plan: lovastatin (MEVACOR) 40 MG tablet  Essential hypertension, benign - significant white coat HTN component.  BP's okay at home. Continue current regimen  Aortic atherosclerosis ([place])  Long term current use of anticoagulant - Plan: CBC with Differential/Platelet   CBC, TSH Not fasting today.  Normal lipids after dose change in Feb  F/u 6 months (fasting) for AWV/med check+, sooner prn. Continue current medication regimen.

## 2015-10-31 ENCOUNTER — Encounter: Payer: Self-pay | Admitting: Vascular Surgery

## 2015-11-05 LAB — VAS US CAROTID
LCCAPSYS: 86 cm/s
LEFT ECA DIAS: -11 cm/s
LICADSYS: -79 cm/s
Left CCA dist dias: 11 cm/s
Left CCA dist sys: 68 cm/s
Left CCA prox dias: 13 cm/s
Left ICA dist dias: -20 cm/s
Left ICA prox dias: 57 cm/s
Left ICA prox sys: 369 cm/s
RCCADSYS: -57 cm/s
RCCAPSYS: 99 cm/s
RIGHT CCA MID DIAS: 15 cm/s
RIGHT ECA DIAS: -7 cm/s
Right CCA prox dias: 15 cm/s

## 2015-11-06 ENCOUNTER — Ambulatory Visit (INDEPENDENT_AMBULATORY_CARE_PROVIDER_SITE_OTHER): Payer: Medicare Other | Admitting: Vascular Surgery

## 2015-11-06 ENCOUNTER — Ambulatory Visit (HOSPITAL_COMMUNITY)
Admission: RE | Admit: 2015-11-06 | Discharge: 2015-11-06 | Disposition: A | Discharge status: Home / Self Care | Payer: Medicare Other | Source: Ambulatory Visit | Attending: Vascular Surgery | Admitting: Vascular Surgery

## 2015-11-06 ENCOUNTER — Encounter: Payer: Self-pay | Admitting: Vascular Surgery

## 2015-11-06 VITALS — BP 213/88 | HR 80 | Temp 97.0°F | Resp 16 | Ht 71.0 in | Wt 171.0 lb

## 2015-11-06 DIAGNOSIS — I5042 Chronic combined systolic (congestive) and diastolic (congestive) heart failure: Secondary | ICD-10-CM | POA: Diagnosis not present

## 2015-11-06 DIAGNOSIS — E119 Type 2 diabetes mellitus without complications: Secondary | ICD-10-CM | POA: Insufficient documentation

## 2015-11-06 DIAGNOSIS — I6523 Occlusion and stenosis of bilateral carotid arteries: Secondary | ICD-10-CM | POA: Diagnosis not present

## 2015-11-06 DIAGNOSIS — I110 Hypertensive heart disease with heart failure: Secondary | ICD-10-CM | POA: Diagnosis not present

## 2015-11-06 DIAGNOSIS — I6522 Occlusion and stenosis of left carotid artery: Secondary | ICD-10-CM | POA: Diagnosis not present

## 2015-11-06 DIAGNOSIS — E7800 Pure hypercholesterolemia, unspecified: Secondary | ICD-10-CM | POA: Diagnosis not present

## 2015-11-06 NOTE — Progress Notes (Signed)
Pt is an 82 year old male who returns for follow-up today for asymptomatic carotid stenosis. He continues to deny any symptoms of TIA amaurosis or stroke. He was quite ill several months ago in the intensive care unit has recovered significantly. He states he still fatigues easily but feels overall improved. He is now on Eliquis for atrial fibrillation. His primary care physician is Aris [place]. Chronic medical problems include hypertension elevated cholesterol and diabetes atrial fibrillation all of which are currently stable.  Review of systems: He denies shortness of breath. He denies chest pain.    Past Medical History   Diagnosis  Date   .  Hypertension     .  Hypercholesteremia     .  Impaired fasting glucose     .  Rotator cuff tear, right  10/2010       supraspinatous and infraspinatous   .  Diabetes mellitus     .  Macular degeneration     .  Heart murmur         mild aortic stenosis   .  Radiation proctitis  11/2012       treated with APC ablation and Canasa suppositories (Dr. Hilarie Fredrickson)   .  TIA (transient ischemic attack)  10/02/13       hosp at [facility] x 1 night   .  Type 2 diabetes mellitus with microalbuminuria or microproteinuria  01/15/2014   .  Aortic atherosclerosis ([place])  11/25/2014   .  Acute encephalopathy  11/27/2014   .  RMSF [facility] spotted fever)  10/2014       (hosp with FUO, encephalopathy)   .  Multiple lacunar infarcts ([place])     .  Acute CVA (cerebrovascular accident) ([place])     .  Prostate cancer ([place])  radiation + seeding implant (Dr.[name])   .  Acute on chronic combined systolic and diastolic CHF (congestive heart failure) ([place])     .  Atrial fibrillation [facility])          Current Outpatient Prescriptions on File Prior to Visit   Medication  Sig  Dispense  Refill   .  acetaminophen (TYLENOL ARTHRITIS PAIN) 650 MG CR tablet  Take 1 tablet (650 mg total) by mouth every 8 (eight) hours as needed.       [name]  apixaban (ELIQUIS) 5 MG TABS tablet  Take 1 tablet (5 mg  total) by mouth 2 (two) times daily.  60 tablet  5   .  hydrocerin (EUCERIN) CREA  Apply 1 application topically 2 (two) times daily. (Patient not taking: Reported on 04/28/2015)    0   .  hydrocortisone cream 1 %  Apply 1 application topically as needed (for ankles).       .  lovastatin (MEVACOR) 40 MG tablet  Take 2 tablets (80 mg total) by mouth at bedtime.  180 tablet  1   .  metoprolol succinate (TOPROL-XL) 50 MG 24 hr tablet  TAKE 1 TABLET BY MOUTH 2 TIMES DAILY (TAKE WITH OR IMMEDIATELY FOLLOWING A MEAL)  180 tablet  0   .  Multiple Vitamins-Minerals (PRESERVISION AREDS) CAPS  Take 1 capsule by mouth every morning.          No current facility-administered medications on file prior to visit.    Physical exam: Filed Vitals:   11/06/15 1021 11/06/15 1027  BP: 217/99 213/88  Pulse: 84 80  Temp: 97 F (36.1 C)   TempSrc: Oral   Resp: 16  Height: 5\' 11"  (1.803 m)   Weight: 171 lb (77.565 kg)   SpO2: 99%       Neck: No carotid bruits new.   Chest: Clear bilaterally Cardiac:Irregular rhythm today murmur appreciated today  Data: Carotid duplex exam from today was reviewed. This shows 40-60% left internal carotid artery stenosis with essentially no change from August 2016. However, velocities were low enough that he was downgraded by category. Right side less than 40% stenosis  Assessment: Asymptomatic moderate left internal carotid artery stenosis probably on the level of 50-60% Plan: Follow-up 1 year repeat carotid duplex scan or sooner if develops symptoms of TIA amaurosis or stroke.  Ruta Hinds, MD [facility] Office: [phone] Pager: [phone]

## 2015-12-01 ENCOUNTER — Other Ambulatory Visit: Payer: Self-pay | Admitting: Family Medicine

## 2015-12-05 DIAGNOSIS — G459 Transient cerebral ischemic attack, unspecified: Secondary | ICD-10-CM | POA: Diagnosis not present

## 2015-12-05 DIAGNOSIS — R319 Hematuria, unspecified: Secondary | ICD-10-CM | POA: Diagnosis not present

## 2015-12-05 DIAGNOSIS — R828 Abnormal findings on cytological and histological examination of urine: Secondary | ICD-10-CM | POA: Diagnosis not present

## 2015-12-05 DIAGNOSIS — Z7901 Long term (current) use of anticoagulants: Secondary | ICD-10-CM | POA: Diagnosis not present

## 2015-12-05 DIAGNOSIS — Z87891 Personal history of nicotine dependence: Secondary | ICD-10-CM | POA: Diagnosis not present

## 2015-12-05 DIAGNOSIS — R310 Gross hematuria: Secondary | ICD-10-CM | POA: Diagnosis not present

## 2015-12-05 DIAGNOSIS — Z8546 Personal history of malignant neoplasm of prostate: Secondary | ICD-10-CM | POA: Diagnosis not present

## 2015-12-16 ENCOUNTER — Other Ambulatory Visit: Payer: Self-pay | Admitting: *Deleted

## 2015-12-16 DIAGNOSIS — D090 Carcinoma in situ of bladder: Secondary | ICD-10-CM | POA: Diagnosis not present

## 2015-12-16 DIAGNOSIS — R319 Hematuria, unspecified: Secondary | ICD-10-CM | POA: Diagnosis not present

## 2015-12-16 MED ORDER — APIXABAN 5 MG PO TABS: 5.0000 mg | ORAL_TABLET | Freq: Two times a day (BID) | ORAL | Status: DC

## 2015-12-16 NOTE — Telephone Encounter (Signed)
Requested Prescriptions   Signed Prescriptions Disp Refills  . apixaban (ELIQUIS) 5 MG TABS tablet 60 tablet 3    Sig: Take 1 tablet (5 mg total) by mouth 2 (two) times daily.    Authorizing Provider: ARIDA, MUHAMMAD A    Ordering User: LOPEZ, MARINA C    

## 2016-01-09 DIAGNOSIS — R310 Gross hematuria: Secondary | ICD-10-CM | POA: Diagnosis not present

## 2016-01-09 DIAGNOSIS — C61 Malignant neoplasm of prostate: Secondary | ICD-10-CM | POA: Diagnosis not present

## 2016-01-09 DIAGNOSIS — N35013 Post-traumatic anterior urethral stricture: Secondary | ICD-10-CM | POA: Diagnosis not present

## 2016-01-15 ENCOUNTER — Other Ambulatory Visit (HOSPITAL_COMMUNITY): Payer: Self-pay | Admitting: Physical Medicine and Rehabilitation

## 2016-01-15 DIAGNOSIS — H353123 Nonexudative age-related macular degeneration, left eye, advanced atrophic without subfoveal involvement: Secondary | ICD-10-CM | POA: Diagnosis not present

## 2016-01-15 DIAGNOSIS — H353190 Nonexudative age-related macular degeneration, unspecified eye, stage unspecified: Secondary | ICD-10-CM | POA: Insufficient documentation

## 2016-01-15 DIAGNOSIS — H2513 Age-related nuclear cataract, bilateral: Secondary | ICD-10-CM | POA: Diagnosis not present

## 2016-01-15 DIAGNOSIS — H43813 Vitreous degeneration, bilateral: Secondary | ICD-10-CM | POA: Diagnosis not present

## 2016-01-15 DIAGNOSIS — H353112 Nonexudative age-related macular degeneration, right eye, intermediate dry stage: Secondary | ICD-10-CM | POA: Diagnosis not present

## 2016-01-22 DIAGNOSIS — I5189 Other ill-defined heart diseases: Secondary | ICD-10-CM | POA: Insufficient documentation

## 2016-01-27 DIAGNOSIS — N35013 Post-traumatic anterior urethral stricture: Secondary | ICD-10-CM | POA: Diagnosis not present

## 2016-01-27 DIAGNOSIS — I6522 Occlusion and stenosis of left carotid artery: Secondary | ICD-10-CM | POA: Diagnosis not present

## 2016-01-27 DIAGNOSIS — Z87891 Personal history of nicotine dependence: Secondary | ICD-10-CM | POA: Diagnosis not present

## 2016-01-27 DIAGNOSIS — Z7901 Long term (current) use of anticoagulants: Secondary | ICD-10-CM | POA: Diagnosis not present

## 2016-01-27 DIAGNOSIS — N358 Other urethral stricture: Secondary | ICD-10-CM | POA: Diagnosis not present

## 2016-01-27 DIAGNOSIS — I5020 Unspecified systolic (congestive) heart failure: Secondary | ICD-10-CM | POA: Diagnosis not present

## 2016-01-27 DIAGNOSIS — Z8673 Personal history of transient ischemic attack (TIA), and cerebral infarction without residual deficits: Secondary | ICD-10-CM | POA: Diagnosis not present

## 2016-01-27 DIAGNOSIS — R351 Nocturia: Secondary | ICD-10-CM | POA: Diagnosis not present

## 2016-01-27 DIAGNOSIS — M1990 Unspecified osteoarthritis, unspecified site: Secondary | ICD-10-CM | POA: Diagnosis not present

## 2016-01-27 DIAGNOSIS — N368 Other specified disorders of urethra: Secondary | ICD-10-CM | POA: Diagnosis not present

## 2016-01-27 DIAGNOSIS — H2513 Age-related nuclear cataract, bilateral: Secondary | ICD-10-CM | POA: Diagnosis not present

## 2016-01-27 DIAGNOSIS — N3289 Other specified disorders of bladder: Secondary | ICD-10-CM | POA: Diagnosis not present

## 2016-01-27 DIAGNOSIS — I4891 Unspecified atrial fibrillation: Secondary | ICD-10-CM | POA: Diagnosis not present

## 2016-01-27 DIAGNOSIS — I4581 Long QT syndrome: Secondary | ICD-10-CM | POA: Diagnosis not present

## 2016-01-27 DIAGNOSIS — I110 Hypertensive heart disease with heart failure: Secondary | ICD-10-CM | POA: Diagnosis not present

## 2016-01-27 DIAGNOSIS — E291 Testicular hypofunction: Secondary | ICD-10-CM | POA: Diagnosis not present

## 2016-01-27 DIAGNOSIS — E785 Hyperlipidemia, unspecified: Secondary | ICD-10-CM | POA: Diagnosis not present

## 2016-01-27 DIAGNOSIS — G4733 Obstructive sleep apnea (adult) (pediatric): Secondary | ICD-10-CM | POA: Diagnosis not present

## 2016-01-27 DIAGNOSIS — H353120 Nonexudative age-related macular degeneration, left eye, stage unspecified: Secondary | ICD-10-CM | POA: Diagnosis not present

## 2016-01-27 DIAGNOSIS — C61 Malignant neoplasm of prostate: Secondary | ICD-10-CM | POA: Diagnosis not present

## 2016-01-27 DIAGNOSIS — Z79899 Other long term (current) drug therapy: Secondary | ICD-10-CM | POA: Diagnosis not present

## 2016-01-27 DIAGNOSIS — Z9889 Other specified postprocedural states: Secondary | ICD-10-CM | POA: Insufficient documentation

## 2016-01-27 DIAGNOSIS — K627 Radiation proctitis: Secondary | ICD-10-CM | POA: Diagnosis not present

## 2016-01-27 DIAGNOSIS — I350 Nonrheumatic aortic (valve) stenosis: Secondary | ICD-10-CM | POA: Diagnosis not present

## 2016-01-27 DIAGNOSIS — H353112 Nonexudative age-related macular degeneration, right eye, intermediate dry stage: Secondary | ICD-10-CM | POA: Diagnosis not present

## 2016-01-27 DIAGNOSIS — I340 Nonrheumatic mitral (valve) insufficiency: Secondary | ICD-10-CM | POA: Diagnosis not present

## 2016-01-27 HISTORY — PX: INTERNAL URETHROTOMY: SHX1835

## 2016-01-29 ENCOUNTER — Encounter: Payer: Self-pay | Admitting: *Deleted

## 2016-02-20 DIAGNOSIS — Z8546 Personal history of malignant neoplasm of prostate: Secondary | ICD-10-CM | POA: Diagnosis not present

## 2016-02-20 DIAGNOSIS — N35013 Post-traumatic anterior urethral stricture: Secondary | ICD-10-CM | POA: Diagnosis not present

## 2016-03-17 DIAGNOSIS — Z23 Encounter for immunization: Secondary | ICD-10-CM | POA: Diagnosis not present

## 2016-04-02 ENCOUNTER — Other Ambulatory Visit: Payer: Self-pay

## 2016-04-02 MED ORDER — LISINOPRIL 10 MG PO TABS: 10.0000 mg | ORAL_TABLET | Freq: Every day | ORAL | 6 refills | Status: DC

## 2016-04-05 DIAGNOSIS — N390 Urinary tract infection, site not specified: Secondary | ICD-10-CM | POA: Diagnosis not present

## 2016-05-04 ENCOUNTER — Telehealth: Payer: Self-pay | Admitting: Cardiovascular Disease

## 2016-05-04 ENCOUNTER — Other Ambulatory Visit: Payer: Self-pay | Admitting: *Deleted

## 2016-05-04 MED ORDER — APIXABAN 5 MG PO TABS: 5.0000 mg | ORAL_TABLET | Freq: Two times a day (BID) | ORAL | 3 refills | Status: DC

## 2016-05-04 NOTE — Telephone Encounter (Signed)
°*  STAT* If patient is at the pharmacy, call can be transferred to refill team.   1. Which medications need to be refilled? (please list name of each medication and dose if known) Eliquis 5 mg   2. Which pharmacy/location (including street and city if local pharmacy) is medication to be sent to? [facility]   3. Do they need a 30 day or 90 day supply? 90 day

## 2016-05-04 NOTE — Telephone Encounter (Signed)
Requested Prescriptions   Signed Prescriptions Disp Refills  . apixaban (ELIQUIS) 5 MG TABS tablet 180 tablet 3    Sig: Take 1 tablet (5 mg total) by mouth 2 (two) times daily.    Authorizing Provider: Kathlyn Sacramento A    Ordering User: Britt Bottom

## 2016-05-14 DIAGNOSIS — R319 Hematuria, unspecified: Secondary | ICD-10-CM | POA: Diagnosis not present

## 2016-05-14 DIAGNOSIS — N35013 Post-traumatic anterior urethral stricture: Secondary | ICD-10-CM | POA: Diagnosis not present

## 2016-05-14 DIAGNOSIS — C61 Malignant neoplasm of prostate: Secondary | ICD-10-CM | POA: Diagnosis not present

## 2016-05-14 DIAGNOSIS — Z8546 Personal history of malignant neoplasm of prostate: Secondary | ICD-10-CM | POA: Diagnosis not present

## 2016-05-14 DIAGNOSIS — Z08 Encounter for follow-up examination after completed treatment for malignant neoplasm: Secondary | ICD-10-CM | POA: Diagnosis not present

## 2016-05-14 DIAGNOSIS — K627 Radiation proctitis: Secondary | ICD-10-CM | POA: Diagnosis not present

## 2016-05-18 NOTE — Progress Notes (Signed)
Chief Complaint  Patient presents with  . Medicare Wellness    nonfasting AWV. No concerns today.     Jeffrey [name] is a 82 y.o. male who presents for annual wellness visit and follow-up on chronic medical   Hypertension:  He forgot to bring list of BP's, but has been checking at home. BP's have been ranging 105-160/70-80 for the most-part.  Has seen it <100 when very active.  Denies headaches, lightheadedness (slightly swimmy feeling if BP <100, which is rare). Denies chest pain, palpitations, shortness of breath, edema. Reports compliance with his medications.   Paroxysmal atrial fibrillation--Cardiologist recommends lifelong anticoagulation. Patient denies any significant bleeding/bruising or side effects from medication. He did recently have some hematuria (see below). Improved now.  He has mild aortic stenosis, asymptomatic.  He last saw [name] in 07/2015. Per that note, he recommended f/u in 6 months (hasn't seen him since).  DM/IFG--He doesn't check his sugars. He goes to the eye doctor every six months (last about 3 mos ago, per pt). He denies any feet concerns, numbness/tingling. He has urinary frequency x several years (related to prostate), no change; denies polydipsia. Drinking 2 beers/day.  Exercise: yardwork only (raking leaves), got a puppy, walking dogs.  Can no longer exercise with his wife (used to walk in the neighborhood) due to her knee problems--she had surgery, but still having knee trouble.  Hyperlipidemia: Compliant with medications; denies side effects. Tries to follow a lowfat, low cholesterol diet, cut back on fried foods.  H/o stroke in 10/2014 (right frontal white matter infarct in June 2016 secondary to small vessel disease).  He saw his neurologist in March 2017. He has asymptomatic L carotid artery stenosis, and is monitored by [name] for this.  Last [place] was in June, to repeat in 10/2016. He denies any neurologic symptoms.  Prostate cancer:  Monitored  by [name].  Last PSA was <0.01 in 07/2015. He has been seen since then, but no records are available. Had some blood in his urine for a while.  Saw some scar tissue (due to radiation). He underwent internal urethrotomy surgery 01/27/16.  He went back to see Dr. Amalia Hailey last week.  No longer seeing any blood in the urine (very rare).  He was treated by an UC for UTI, treated with Cipro, changed to Septra after culture came back. That helped, but he intermittently has some symptoms.  Culture was done last week by [name], waiting on results.   Immunization History  Administered Date(s) Administered  . Influenza Split 01/30/2012, 02/03/2014, 03/25/2015  . Influenza, High Dose Seasonal PF 03/17/2016  . Influenza-Unspecified 01/29/2013, 03/25/2015  . Pneumococcal Conjugate-13 10/17/2014  . Pneumococcal Polysaccharide-23 12/17/2004, 10/02/2012  . Td 10/29/2004   Given Rx for Tdap and zostavax last year, never got it. Last colonoscopy: 11/2012 with Dr. Hilarie Fredrickson (radiation proctitis)  Last PSA: per urologist, every 6 months (last in 07/2015 per epic, has one pending currently) Dentist: twice yearly Ophtho: twice yearly  Exercise: yardwork, walks the dogs.  Other doctors caring for patient include:  GI--Dr. Hilarie Fredrickson  Urologist: [name]  Dentist: Dr.Taloopis Ophtho: Dr. Shea Stakes (at [facility])  Cardiologist: [name] Ortho: Dr. Berenice Primas (no longer under his care; problem resolved)  Neuro: [name] Vascular: [name]  Depression screen: negative (see screen in epic). Fall screen: none in the last year ADL screen--notable for decreased hearing (doesn't think he is ready for hearing aids yet, trouble in crowds only), and vision (macular degeneration and cataracts;  may have surgery on the left Spring 2018), leakage of urine, urge incontinence (wears Depends)--see screen in [facility].  End of Life Discussion:  Patient knows he has POA, can't recall if he did the medical POA. Given new forms  Past  Medical History:  Diagnosis Date  . Acute CVA (cerebrovascular accident) ([place])   . Acute encephalopathy 11/27/2014  . Acute on chronic combined systolic and diastolic CHF (congestive heart failure) ([place])   . Aortic atherosclerosis ([place]) 11/25/2014  . Atrial fibrillation ([place])   . Diabetes mellitus   . Heart murmur    mild aortic stenosis  . Hypercholesteremia   . Hypertension   . Impaired fasting glucose   . Macular degeneration   . Multiple lacunar infarcts ([place])   . Prostate cancer ([place]) radiation + seeding implant (Dr.[name])  . 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 Hilarie Fredrickson)  . RMSF [facility] spotted fever) 10/2014   (hosp with FUO, encephalopathy)  . Rotator cuff tear, right 10/2010   supraspinatous and infraspinatous  . TIA (transient ischemic attack) 10/02/13   hosp at [facility] x 1 night  . 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 COLONOSCOPY N/A 11/28/2012   Procedure: COLONOSCOPY;  Surgeon: Jerene Bears, MD;  Location: [facility];  Service: Gastroenterology;  Laterality: N/A;  . INTERNAL URETHROTOMY  01/27/2016   WF  . prostate seed implant    . ROTATOR CUFF REPAIR  06/16/2011   right (Dr. Berenice Primas)  . TONSILLECTOMY AND ADENOIDECTOMY  age 89    Social History   Social History  . Marital status: Married    Spouse name: N/A  . Number of children: 4  . Years of education: N/A   Occupational History  . Retired    Social History Main Topics  . Smoking status: Former Smoker    Packs/day: 1.00    Years: 20.00    Types: Cigarettes    Quit date: 05/31/1980  . Smokeless tobacco: Never Used  . Alcohol use 4.2 oz/week    7 Cans of beer per week     Comment: can of beer daily  . Drug use: No  . Sexual activity: Yes    Partners: Female   Other Topics Concern  . Not on file   Social History Narrative   Retired Engineer, structural.  2  sons, 2 daughters (all in [place]), 15 grandchildren, 1 great grandchild    Family History  Problem Relation Age of Onset  . Heart disease Father 6    Died suddenly.  No diagnosis  . Stroke Sister   . Dementia Mother   . Diabetes Mother   . Heart disease Mother   . Hypertension Mother   . Cancer Daughter     ovarian (?) vs other male cancer; s/p hyst doing well  . Diabetes Daughter     GDM, and now AODM  . Heart disease Son     congestive heart failure--improved    Outpatient Encounter Prescriptions as of 05/19/2016  Medication Sig Note  . acetaminophen (TYLENOL ARTHRITIS PAIN) 650 MG CR tablet Take 1 tablet (650 mg total) by mouth every 8 (eight) hours as needed. 04/28/2015: 2 qam and 2 qpm  . apixaban (ELIQUIS) 5 MG TABS tablet Take 1 tablet (5 mg total) by mouth 2 (two) times daily.   . hydrocortisone cream 1 % Apply 1 application topically as needed (for  ankles). Reported on 10/29/2015 12/23/2014: Uses prn for rash on ankles  . lisinopril (PRINIVIL,ZESTRIL) 10 MG tablet Take 1 tablet (10 mg total) by mouth daily.   [name] lovastatin (MEVACOR) 40 MG tablet Take 2 tablets (80 mg total) by mouth at bedtime.   . metoprolol succinate (TOPROL-XL) 50 MG 24 hr tablet TAKE ONE TABLET BY MOUTH TWICE DAILY WITH OR IMMEDIATELY FOLLOWING A MEAL   . Multiple Vitamins-Minerals (PRESERVISION AREDS) CAPS Take 2 capsules by mouth every morning.     No facility-administered encounter medications on file as of 05/19/2016.     No Known Allergies   ROS: The patient denies anorexia, fever, weight changes, headaches, ear pain, hoarseness, chest pain, palpitations, dyspnea on exertion, cough, swelling, nausea, vomiting, diarrhea, constipation, abdominal pain, melena, hematochezia, indigestion/heartburn, weakened urine stream, dysuria, genital lesions, joint pains (just arthritis in hands), numbness, tingling, weakness, suspicious skin lesions, depression, anxiety, abnormal bleeding/bruising, or enlarged lymph  nodes.  Intermittent rashes--sometimes small pimples around his neck, sometimes at his ankles (relieved by OTC hydrocortisone cream). +bilateral hearing loss, L>R  Some chronic runny nose. Vision loss--worse at night, doesn't drive at night. Known macular degeneration. Left cataract has enlarged and is considering surgery in the spring. Urinary urgency has improved since the surgery.  Has some urge incontinence, wears Depends. Nocturia improved (was 7-8, now 3-4). +ED He states that wife says he snores, but no apnea. Energy is good (no longer napping as often as last year). Slight tremor (both hands, intermittent)    PHYSICAL EXAM:  BP (!) 210/100 (BP Location: Left Arm, Patient Position: Sitting, Cuff Size: Normal)   Pulse 80   Ht _0  (1.778 m)   Wt 172 lb 6.4 oz (78.2 kg)   BMI 24.74 kg/m    General Appearance:  Alert, cooperative, no distress, appears stated age  Head:  Normocephalic, without obvious abnormality, atraumatic   Eyes:  PERRL, conjunctiva/corneas clear, EOM's intact, fundi  benign   Ears:  Normal TM's and external ear canals   Nose:  Nares normal, mucosa normal, no drainage or sinus tenderness   Throat:  Lips, mucosa, and tongue normal; gums normal.   Neck:  Supple, no lymphadenopathy; thyroid: no enlargement/tenderness/nodules; some radiation of murmur up to carotids. no JVD   Back:  Spine nontender, no curvature, ROM normal, no CVA tenderness   Lungs:  Clear to auscultation bilaterally without wheezes, rales or ronchi; respirations unlabored   Chest Wall:  No tenderness or deformity   Heart:  Regular rate and rhythm; S1 and S2 normal, no rub or gallop. 3/6 SEM heard throughout, loudest at RUSB.  Breast Exam:  No chest wall tenderness, masses or gynecomastia   Abdomen:  Soft, non-tender, nondistended, normoactive bowel sounds, no masses, no hepatosplenomegaly   Genitalia:  Deferred to urologist   Rectal:  Deferred    Extremities:  No clubbing, cyanosis or edema   Pulses:  2+ and symmetric all extremities   Skin:  Skin color, texture, turgor normal, no rashes or lesions; Large SK on right face-- 34m x 15m (20 x 13 mm last year), SK's, skin tags and angiomas scattered. R neck-- a few flesh colored papules  Lymph nodes:  Cervical, supraclavicular, and axillary nodes normal   Neurologic:  CNII-XII intact, normal strength, sensation and gait; reflexes 2+ and symmetric throughout. Mild resting tremor noted bilaterally   Psych:  Normal mood, affect, hygiene and grooming.   Diabetic foot exam performed--left 3rd and 4th toenails have whitish discoloration.  Some  thickening of great toenails and left second toe--very long, and wrapped around to the bottom of the toe. Normal monofilament exam.   Lab Results  Component Value Date   HGBA1C 5.9 05/19/2016    ASSESSMENT/PLAN:  Medicare annual wellness visit, subsequent  Type 2 diabetes mellitus without complication, without long-term current use of insulin ([place]) - diet controlled - Plan: HgB A1c, Microalbumin / creatinine urine ratio, Comprehensive metabolic panel  Paroxysmal atrial fibrillation (HCC) - asymptomatic; anticoagulated  Long term current use of anticoagulant - Plan: CBC with Differential/Platelet  Essential hypertension, benign - significant white coat component. Asymptomatic with high readings today; lower at home. CPM - Plan: Comprehensive metabolic panel  Pure hypercholesterolemia - Plan: Comprehensive metabolic panel, Lipid panel  Medication monitoring encounter  Urine microalb today   He is nonfasting today Prefers to come back for fasting lab visit in January Declines nonfasting labs today  CBC, c-met, lipid  Recommended at least 30 minutes of aerobic activity at least 5 days/week, weight-bearing exercise 2x/wk; proper sunscreen use reviewed; healthy diet and alcohol recommendations (1 drink/day or  less preferred, occasionally 2) reviewed; regular seatbelt use; changing batteries in smoke detectors. Immunization recommendations --past due for Tdap (recommended at his Wellness visit last year, for him to get at the pharmacy when he got Part D coverage), but he never did.  Another Rx written for Tdap. Zostavax had also been recommended/rx'd last year--advised I recommend that he wait for Shingrix instead, a better vaccine.  Rx also given, for him to get at pharmacy in future if covered by Part D.  Colonoscopy recommendations reviewed--UTD.  Full Code, Full Care  You are past due to follow up with [name].  Please call their office to check on when they want to see you again next and schedule an appointment.  Derm for skin recheck recommended.    Medicare Attestation I have personally reviewed: The patient's medical and social history Their use of alcohol, tobacco or illicit drugs Their current medications and supplements The patient's functional ability including ADLs,fall risks, home safety risks, cognitive, and hearing and visual impairment Diet and physical activities Evidence for depression or mood disorders  The patient's weight, height, and BMI have been recorded in the chart.  I have made referrals, counseling, and provided education to the patient based on review of the above and I have provided the patient with a written personalized care plan for preventive services.     Rudolph Dobler A, MD   05/18/2016

## 2016-05-19 ENCOUNTER — Encounter: Payer: Self-pay | Admitting: Family Medicine

## 2016-05-19 ENCOUNTER — Ambulatory Visit (INDEPENDENT_AMBULATORY_CARE_PROVIDER_SITE_OTHER): Payer: Medicare Other | Admitting: Family Medicine

## 2016-05-19 VITALS — BP 210/100 | HR 80 | Ht 70.0 in | Wt 172.4 lb

## 2016-05-19 DIAGNOSIS — I6523 Occlusion and stenosis of bilateral carotid arteries: Secondary | ICD-10-CM | POA: Diagnosis not present

## 2016-05-19 DIAGNOSIS — I10 Essential (primary) hypertension: Secondary | ICD-10-CM

## 2016-05-19 DIAGNOSIS — I480 Paroxysmal atrial fibrillation: Secondary | ICD-10-CM

## 2016-05-19 DIAGNOSIS — E7800 Pure hypercholesterolemia, unspecified: Secondary | ICD-10-CM | POA: Diagnosis not present

## 2016-05-19 DIAGNOSIS — Z0000 Encounter for general adult medical examination without abnormal findings: Secondary | ICD-10-CM

## 2016-05-19 DIAGNOSIS — Z5181 Encounter for therapeutic drug level monitoring: Secondary | ICD-10-CM | POA: Diagnosis not present

## 2016-05-19 DIAGNOSIS — E119 Type 2 diabetes mellitus without complications: Secondary | ICD-10-CM | POA: Diagnosis not present

## 2016-05-19 DIAGNOSIS — Z7901 Long term (current) use of anticoagulants: Secondary | ICD-10-CM | POA: Diagnosis not present

## 2016-05-19 LAB — POCT GLYCOSYLATED HEMOGLOBIN (HGB A1C): HEMOGLOBIN A1C: 5.9

## 2016-05-19 NOTE — Patient Instructions (Addendum)
HEALTH MAINTENANCE RECOMMENDATIONS:  It is recommended that you get at least 30 minutes of aerobic exercise at least 5 days/week (for weight loss, you may need as much as 60-90 minutes). This can be any activity that gets your heart rate up. This can be divided in 10-15 minute intervals if needed, but try and build up your endurance at least once a week.  Weight bearing exercise is also recommended twice weekly.  Eat a healthy diet with lots of vegetables, fruits and fiber.  "Colorful" foods have a lot of vitamins (ie green vegetables, tomatoes, red peppers, etc).  Limit sweet tea, regular sodas and alcoholic beverages, all of which has a lot of calories and sugar.  Up to 2 alcoholic drinks daily may be beneficial for men (unless trying to lose weight, watch sugars).  Drink a lot of water.  Sunscreen of at least SPF 30 should be used on all sun-exposed parts of the skin when outside between the hours of 10 am and 4 pm (not just when at beach or pool, but even with exercise, golf, tennis, and yard work!)  Use a sunscreen that says "broad spectrum" so it covers both UVA and UVB rays, and make sure to reapply every 1-2 hours.  Remember to change the batteries in your smoke detectors when changing your clock times in the spring and fall.  Use your seat belt every time you are in a car, and please drive safely and not be distracted with cell phones and texting while driving.   Mr. Gennaro , Thank you for taking time to come for your Medicare Wellness Visit. I appreciate your ongoing commitment to your health goals. Please review the following plan we discussed and let me know if I can assist you in the future.   These are the goals we discussed: Goals    None      This is a list of the screening recommended for you and due dates:  Health Maintenance  Topic Date Due  . Eye exam for diabetics  11/12/1942  . Shingles Vaccine  11/13/1994  . Tetanus Vaccine  10/30/2014  . Hemoglobin A1C  04/29/2016    . Complete foot exam   10/28/2016  . Flu Shot  Completed  . Pneumonia vaccines  Completed   Please make sure that your eye doctor is aware that you have diabetes, and ask them to send [place] a copy of the yearly diabetic eye exam.  Please get the tetanus vaccine at the pharmacy (Tdap)--prescription was given to you. This is once every 10 years. This is likely covered by part D medicare--it I NOT covered for me to give in the office.    A1c was done today (and good!  5.9)  I recommend getting the new shingles vaccine (Shingrix) when available. You will need to check with your insurance to see if it is covered, and if covered by Medicare Part D, you need to get from the pharmacy rather than our office.  It is a series of 2 injections, spaced 2 months apart. I gave you a prescription for this as well. This is NOT a live virus vaccine, so don't worry about the timing of it compared to other vaccines.  You are past due to follow up with [name].  Please call their office to check on when they want to see you again next and schedule an appointment.  I think it is a good idea to see a dermatologist for a skin check. The  large seborrheic keratosis on your right cheek has grown over the last year.

## 2016-05-20 LAB — MICROALBUMIN / CREATININE URINE RATIO
Creatinine, Urine: 332 mg/dL (ref 20–370)
MICROALB/CREAT RATIO: 32 ug/mg{creat} — AB (ref <30)
Microalb, Ur: 10.5 mg/dL

## 2016-06-02 ENCOUNTER — Other Ambulatory Visit: Payer: Medicare Other

## 2016-06-02 DIAGNOSIS — Z7901 Long term (current) use of anticoagulants: Secondary | ICD-10-CM | POA: Diagnosis not present

## 2016-06-02 DIAGNOSIS — E7800 Pure hypercholesterolemia, unspecified: Secondary | ICD-10-CM

## 2016-06-02 DIAGNOSIS — I10 Essential (primary) hypertension: Secondary | ICD-10-CM

## 2016-06-02 DIAGNOSIS — E119 Type 2 diabetes mellitus without complications: Secondary | ICD-10-CM

## 2016-06-02 LAB — CBC WITH DIFFERENTIAL/PLATELET
BASOS ABS: 0 {cells}/uL (ref 0–200)
Basophils Relative: 0 %
EOS PCT: 2 %
Eosinophils Absolute: 142 cells/uL (ref 15–500)
HCT: 40.2 % (ref 38.5–50.0)
HEMOGLOBIN: 12.9 g/dL — AB (ref 13.2–17.1)
LYMPHS ABS: 2201 {cells}/uL (ref 850–3900)
Lymphocytes Relative: 31 %
MCH: 32 pg (ref 27.0–33.0)
MCHC: 32.1 g/dL (ref 32.0–36.0)
MCV: 99.8 fL (ref 80.0–100.0)
MONOS PCT: 11 %
MPV: 10 fL (ref 7.5–12.5)
Monocytes Absolute: 781 cells/uL (ref 200–950)
Neutro Abs: 3976 cells/uL (ref 1500–7800)
Neutrophils Relative %: 56 %
PLATELETS: 309 10*3/uL (ref 140–400)
RBC: 4.03 MIL/uL — AB (ref 4.20–5.80)
RDW: 13.9 % (ref 11.0–15.0)
WBC: 7.1 10*3/uL (ref 4.0–10.5)

## 2016-06-03 LAB — COMPREHENSIVE METABOLIC PANEL
ALT: 17 U/L (ref 9–46)
AST: 21 U/L (ref 10–35)
Albumin: 4.4 g/dL (ref 3.6–5.1)
Alkaline Phosphatase: 37 U/L — ABNORMAL LOW (ref 40–115)
BUN: 16 mg/dL (ref 7–25)
CO2: 25 mmol/L (ref 20–31)
CREATININE: 0.84 mg/dL (ref 0.70–1.11)
Calcium: 9.5 mg/dL (ref 8.6–10.3)
Chloride: 103 mmol/L (ref 98–110)
GLUCOSE: 122 mg/dL — AB (ref 65–99)
Potassium: 4.5 mmol/L (ref 3.5–5.3)
SODIUM: 142 mmol/L (ref 135–146)
Total Bilirubin: 0.6 mg/dL (ref 0.2–1.2)
Total Protein: 7.1 g/dL (ref 6.1–8.1)

## 2016-06-03 LAB — LIPID PANEL
Cholesterol: 166 mg/dL (ref <200)
HDL: 66 mg/dL (ref >40)
LDL CALC: 72 mg/dL (ref <100)
Total CHOL/HDL Ratio: 2.5 Ratio (ref <5.0)
Triglycerides: 140 mg/dL (ref <150)
VLDL: 28 mg/dL (ref <30)

## 2016-06-14 ENCOUNTER — Other Ambulatory Visit: Payer: Self-pay | Admitting: Family Medicine

## 2016-07-01 ENCOUNTER — Encounter: Payer: Self-pay | Admitting: Cardiovascular Disease

## 2016-07-01 ENCOUNTER — Ambulatory Visit (INDEPENDENT_AMBULATORY_CARE_PROVIDER_SITE_OTHER): Payer: Medicare Other | Admitting: Cardiovascular Disease

## 2016-07-01 VITALS — BP 140/100 | HR 76 | Ht 71.0 in | Wt 174.8 lb

## 2016-07-01 DIAGNOSIS — I10 Essential (primary) hypertension: Secondary | ICD-10-CM

## 2016-07-01 DIAGNOSIS — I350 Nonrheumatic aortic (valve) stenosis: Secondary | ICD-10-CM

## 2016-07-01 DIAGNOSIS — I480 Paroxysmal atrial fibrillation: Secondary | ICD-10-CM

## 2016-07-01 DIAGNOSIS — I779 Disorder of arteries and arterioles, unspecified: Secondary | ICD-10-CM | POA: Diagnosis not present

## 2016-07-01 DIAGNOSIS — I739 Peripheral vascular disease, unspecified: Secondary | ICD-10-CM

## 2016-07-01 MED ORDER — LISINOPRIL 20 MG PO TABS: 20.0000 mg | ORAL_TABLET | Freq: Every day | ORAL | 3 refills | Status: DC

## 2016-07-01 NOTE — Progress Notes (Signed)
Cardiology Office Note   Date:  07/01/2016   ID:  Jeffrey [name], Jeffrey [name] March 05, 1933, MRN [id number]  PCP:  Vikki Ports, MD  Cardiologist:   Kathlyn Sacramento, MD   Chief Complaint  Patient presents with  . other    6 month follow up. Meds reviewed by the pt. verbally. "doing well."       History of Present Illness: Jeffrey [name] is a 83 y.o. male who presents for a follow-up visit regarding paroxysmal atrial fibrillation.  He has known history of hypertension, hyperlipidemia, TIA, mild aortic stenosis, moderate left carotid stenosis and borderline diabetes mellitus. He had previous stroke in June 2016 and later was found to have [facility] Fever which was treated with doxycycline.  He was diagnosed with atrial fibrillation with mildly elevated troponin in 2016.  Echocardiogram on 11/28/14 showed mild to moderately reduced LV systolic function, EF [id number], diffuse hypokinesis with severe apical hypokinesis, grade I diastolic dysfunction, moderately thickened and calcified aortic valve leaflets with mild stenosis, mild mitral regurgitation, mild left atrial dilatation, mildly reduced RV systolic function, mild pulmonary hypertension, with no definitive vegetations seen.  He underwent a nuclear stress test in August of 2016 which showed no evidence of ischemia with normal ejection fraction.  During last visit, I resumed lisinopril 10 mg once daily for hypertension. He was seen by his primary care physician in December and was noted to have a blood pressure of 210/100. The patient is thought to have a component of white coat syndrome. He has been doing very well and denies any chest pain or shortness of breath. No palpitations. He reports that his blood pressure fluctuates significantly at home but typically is below 150.  Past Medical History:  Diagnosis Date  . Acute CVA (cerebrovascular accident) ([place])   . Acute encephalopathy 11/27/2014  . Acute on chronic combined systolic and  diastolic CHF (congestive heart failure) ([place])   . Aortic atherosclerosis ([place]) 11/25/2014  . Atrial fibrillation ([place])   . Diabetes mellitus   . Heart murmur    mild aortic stenosis  . Hypercholesteremia   . Hypertension   . Impaired fasting glucose   . Macular degeneration   . Multiple lacunar infarcts ([place])   . Prostate cancer ([place]) radiation + seeding implant (Dr.[name])  . 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 Hilarie Fredrickson)  . RMSF [facility] spotted fever) 10/2014   (hosp with FUO, encephalopathy)  . Rotator cuff tear, right 10/2010   supraspinatous and infraspinatous  . TIA (transient ischemic attack) 10/02/13   hosp at [facility] x 1 night  . 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 COLONOSCOPY N/A 11/28/2012   Procedure: COLONOSCOPY;  Surgeon: Jerene Bears, MD;  [facility];  Service: Gastroenterology;  Laterality: N/A;  . INTERNAL URETHROTOMY  01/27/2016   WF  . prostate seed implant    . ROTATOR CUFF REPAIR  06/16/2011   right (Dr. Berenice Primas)  . TONSILLECTOMY AND ADENOIDECTOMY  age 72     Current Outpatient Prescriptions  Medication Sig Dispense Refill  . acetaminophen (TYLENOL ARTHRITIS PAIN) 650 MG CR tablet Take 1 tablet (650 mg total) by mouth every 8 (eight) hours as needed.    [name] apixaban (ELIQUIS) 5 MG TABS tablet Take 1 tablet (5 mg total) by mouth 2 (two) times daily. 180 tablet 3  . hydrocortisone cream 1 %  Apply 1 application topically as needed (for ankles). Reported on 10/29/2015    . lovastatin (MEVACOR) 40 MG tablet Take 2 tablets (80 mg total) by mouth at bedtime. 180 tablet 1  . metoprolol succinate (TOPROL-XL) 50 MG 24 hr tablet TAKE ONE TABLET BY MOUTH TWICE DAILY WITH OR IMMEDIATELY FOLLOWING A MEAL 180 tablet 1  . Multiple Vitamins-Minerals (PRESERVISION AREDS) CAPS Take 2 capsules by mouth every morning.      No  current facility-administered medications for this visit.     Allergies:   Patient has no known allergies.    Social History:  The patient  reports that he quit smoking about 36 years ago. His smoking use included Cigarettes. He has a 20.00 pack-year smoking history. He has never used smokeless tobacco. He reports that he drinks about 4.2 oz of alcohol per week . He reports that he does not use drugs.   Family History:  The patient's family history includes Cancer in his daughter; Dementia in his mother; Diabetes in his daughter and mother; Heart disease in his mother and son; Heart disease (age of onset: 40) in his father; Hypertension in his mother; Stroke in his sister.    ROS:  Please see the history of present illness.   Otherwise, review of systems are positive for none.   All other systems are reviewed and negative.    PHYSICAL EXAM: VS:  BP (!) 140/100 (BP Location: Left Arm, Patient Position: Sitting, Cuff Size: Normal)   Pulse 76   Ht 5\' 11"  (1.803 m)   Wt 174 lb 12 oz (79.3 kg)   BMI 24.37 kg/m  , BMI Body mass index is 24.37 kg/m. GEN: Well nourished, well developed, in no acute distress  HEENT: normal  Neck: no JVD or masses. Faint bilateral carotid bruits Cardiac: Regular with premature beats; no rubs, or gallops,no edema . There is a 3/6 crescendo decrescendo murmur in the aortic area which is mid peaking with slightly diminished S2 Respiratory:  clear to auscultation bilaterally, normal work of breathing GI: soft, nontender, nondistended, + BS MS: no deformity or atrophy  Skin: warm and dry, no rash Neuro:  Strength and sensation are intact Psych: euthymic mood, full affect   EKG:  EKG is ordered today. The ekg ordered today demonstrates normal sinus rhythm . No significant ST or T wave changes.   Recent Labs: 10/29/2015: TSH 1.08 06/02/2016: ALT 17; BUN 16; Creat 0.84; Hemoglobin 12.9; Platelets 309; Potassium 4.5; Sodium 142    Lipid Panel    Component  Value Date/Time   CHOL 166 06/02/2016 0714   CHOL 184 10/01/2013 1719   TRIG 140 06/02/2016 0714   TRIG 400 (H) 10/01/2013 1719   HDL 66 06/02/2016 0714   HDL 55 10/01/2013 1719   CHOLHDL 2.5 06/02/2016 0714   VLDL 28 06/02/2016 0714   VLDL 80 (H) 10/01/2013 1719   LDLCALC 72 06/02/2016 0714   LDLCALC 49 10/01/2013 1719      Wt Readings from Last 3 Encounters:  07/01/16 174 lb 12 oz (79.3 kg)  05/19/16 172 lb 6.4 oz (78.2 kg)  11/06/15 171 lb (77.6 kg)      Other studies Reviewed: Additional studies/ records that were reviewed today include: Recent office notes. Review of the above records demonstrates: Persistently elevated blood pressure   ASSESSMENT AND PLAN:  1.  Paroxysmal atrial fibrillation: The patient continues to be in sinus rhythm. He is tolerating anticoagulation with no reported side effects. I reviewed her recent  labs from this month which overall were unremarkable.  2. Essential hypertension:  Blood pressure continues to be elevated and it does seem that he does have a component of white coat syndrome. But even with that, I think we should try to lower his blood pressure a little more. I increased lisinopril to 20 mg once daily.  3.  Aortic valve stenosis: This was mild on echocardiogram in 2016. His cardiac murmurs suggestive of moderate stenosis. I requested a follow-up echocardiogram.  4. Hyperlipidemia: Continue treatment with lovastatin. Lipid profile from February was reviewed which showed an LDL of 83.   5. Left carotid stenosis: This is being followed by [name].    Disposition:   FU with me in 6 months  Signed,  Kathlyn Sacramento, MD  07/01/2016 9:42 AM    [place]

## 2016-07-01 NOTE — Patient Instructions (Signed)
Medication Instructions:  Your physician has recommended you make the following change in your medication:  1- INCREASE Lisinopril to 20 mg by mouth once a day.   Labwork: None   Testing/Procedures: Your physician has requested that you have an echocardiogram. Echocardiography is a painless test that uses sound waves to create images of your heart. It provides your doctor with information about the size and shape of your heart and how well your heart's chambers and valves are working. This procedure takes approximately one hour. There are no restrictions for this procedure.    Follow-Up: Your physician wants you to follow-up in: [place]. You will receive a reminder letter in the mail two months in advance. If you don't receive a letter, please call our office to schedule the follow-up appointment.   Any Other Special Instructions Will Be Listed Below (If Applicable).  Echocardiogram An echocardiogram, or echocardiography, uses sound waves (ultrasound) to produce an image of your heart. The echocardiogram is simple, painless, obtained within a short period of time, and offers valuable information to your health care provider. The images from an echocardiogram can provide information such as:  Evidence of coronary artery disease (CAD).  Heart size.  Heart muscle function.  Heart valve function.  Aneurysm detection.  Evidence of a past heart attack.  Fluid buildup around the heart.  Heart muscle thickening.  Assess heart valve function. Tell a health care provider about:  Any allergies you have.  All medicines you are taking, including vitamins, herbs, eye drops, creams, and over-the-counter medicines.  Any problems you or family members have had with anesthetic medicines.  Any blood disorders you have.  Any surgeries you have had.  Any medical conditions you have.  Whether you are pregnant or may be pregnant. What happens before the procedure? No  special preparation is needed. Eat and drink normally. What happens during the procedure?  In order to produce an image of your heart, gel will be applied to your chest and a wand-like tool (transducer) will be moved over your chest. The gel will help transmit the sound waves from the transducer. The sound waves will harmlessly bounce off your heart to allow the heart images to be captured in real-time motion. These images will then be recorded.  You may need an IV to receive a medicine that improves the quality of the pictures. What happens after the procedure? You may return to your normal schedule including diet, activities, and medicines, unless your health care provider tells you otherwise. This information is not intended to replace advice given to you by your health care provider. Make sure you discuss any questions you have with your health care provider. Document Released: 05/14/2000 Document Revised: 01/03/2016 Document Reviewed: 01/22/2013 Elsevier Interactive Patient Education  2017 [employer].     If you need a refill on your cardiac medications before your next appointment, please call your pharmacy.

## 2016-07-26 ENCOUNTER — Other Ambulatory Visit: Payer: Self-pay

## 2016-07-26 ENCOUNTER — Ambulatory Visit (INDEPENDENT_AMBULATORY_CARE_PROVIDER_SITE_OTHER): Payer: Medicare Other

## 2016-07-26 DIAGNOSIS — I10 Essential (primary) hypertension: Secondary | ICD-10-CM | POA: Diagnosis not present

## 2016-07-26 DIAGNOSIS — I350 Nonrheumatic aortic (valve) stenosis: Secondary | ICD-10-CM

## 2016-08-19 DIAGNOSIS — L600 Ingrowing nail: Secondary | ICD-10-CM | POA: Diagnosis not present

## 2016-08-19 DIAGNOSIS — M79675 Pain in left toe(s): Secondary | ICD-10-CM | POA: Diagnosis not present

## 2016-08-19 DIAGNOSIS — B351 Tinea unguium: Secondary | ICD-10-CM | POA: Diagnosis not present

## 2016-08-26 DIAGNOSIS — L309 Dermatitis, unspecified: Secondary | ICD-10-CM | POA: Diagnosis not present

## 2016-08-26 DIAGNOSIS — L570 Actinic keratosis: Secondary | ICD-10-CM | POA: Diagnosis not present

## 2016-08-26 DIAGNOSIS — D225 Melanocytic nevi of trunk: Secondary | ICD-10-CM | POA: Diagnosis not present

## 2016-08-26 DIAGNOSIS — L821 Other seborrheic keratosis: Secondary | ICD-10-CM | POA: Diagnosis not present

## 2016-08-26 DIAGNOSIS — D2261 Melanocytic nevi of right upper limb, including shoulder: Secondary | ICD-10-CM | POA: Diagnosis not present

## 2016-08-26 DIAGNOSIS — X32XXXA Exposure to sunlight, initial encounter: Secondary | ICD-10-CM | POA: Diagnosis not present

## 2016-08-29 LAB — HM DIABETES EYE EXAM

## 2016-09-16 DIAGNOSIS — H2513 Age-related nuclear cataract, bilateral: Secondary | ICD-10-CM | POA: Diagnosis not present

## 2016-09-16 DIAGNOSIS — H353112 Nonexudative age-related macular degeneration, right eye, intermediate dry stage: Secondary | ICD-10-CM | POA: Diagnosis not present

## 2016-09-16 DIAGNOSIS — H353124 Nonexudative age-related macular degeneration, left eye, advanced atrophic with subfoveal involvement: Secondary | ICD-10-CM | POA: Diagnosis not present

## 2016-09-16 DIAGNOSIS — H43813 Vitreous degeneration, bilateral: Secondary | ICD-10-CM | POA: Diagnosis not present

## 2016-10-11 ENCOUNTER — Other Ambulatory Visit: Payer: Self-pay | Admitting: Family Medicine

## 2016-10-12 NOTE — Telephone Encounter (Signed)
Pt has an appt 6/21 with Dr. Tomi Bamberger

## 2016-10-28 NOTE — Addendum Note (Signed)
Addended by: Lianne Cure A on: 10/28/2016 03:15 PM   Modules accepted: Orders

## 2016-11-01 DIAGNOSIS — H2513 Age-related nuclear cataract, bilateral: Secondary | ICD-10-CM | POA: Diagnosis not present

## 2016-11-11 ENCOUNTER — Ambulatory Visit: Payer: Medicare Other | Admitting: Vascular Surgery

## 2016-11-11 ENCOUNTER — Encounter (HOSPITAL_COMMUNITY): Payer: Medicare Other

## 2016-11-12 ENCOUNTER — Encounter: Payer: Self-pay | Admitting: Family

## 2016-11-17 NOTE — Progress Notes (Signed)
Chief Complaint  Patient presents with  . Hypertension    nonfasting med check. No concerns. Did want to let you that he is scheduled for cataract surgery of the right eye July 11th.     Hypertension: Dr. Fletcher [name] increased lisinopril dose to 20 mg daily at his last visit in February.  He has a significant white coat component to his blood pressures.  BP's have been running 118 (at the lowest after strenous exercise outside) up to 170 (when has done nothing), mostly 071'Q systolic, diastolic ranging from 19-75.  Denies headaches, lightheadedness, chest pain, palpitations, shortness of breath, edema. Reports compliance with his medications.   Paroxysmal atrial fibrillation--Cardiologist recommends lifelong anticoagulation. Patient denies any significant bleeding/bruising or side effects from medication. He last saw Dr. Fletcher [name] in 07/2016, and had f/u echo which showed normal EF with stable mild aortic stenosis.   DM/IFG--He doesn't check his sugars. He goes to the eye doctor every six months, scheduled for right cataract removal in July.  He has macular degeneration also. He denies any feet concerns, numbness/tingling.He had an issue with a toenail and saw a podiatrist who cut his nails.  He has urinary frequency x several years (related to prostate), no change; denies polydipsia. Drinking 2-3 beers/day. Exercise: walking dogs only once daily due to the heat, usually 2x/day, about a city block (10-15 minutes).  Gardening, spends a lot of time doing yardwork, mows an acre ([employer]). Can no longer exercise with his wife (used to walk in the neighborhood) due to her knee problems--she had surgery, but still having knee trouble.  Hyperlipidemia: Compliant with medications; denies side effects. Tries to follow a lowfat, low cholesterol diet, cut back on fried foods. Lab Results  Component Value Date   CHOL 166 06/02/2016   HDL 66 06/02/2016   LDLCALC 72 06/02/2016   TRIG 140 06/02/2016   CHOLHDL 2.5 06/02/2016    H/o stroke in 10/2014 (right frontal white matter infarct in June 2016 secondary to small vessel disease).  He has asymptomatic L carotid artery stenosis, and is monitored by Dr. Oneida [name] for this. Repeat US is scheduled for 6/28. He denies any neurologic symptoms. He has many questions regarding blockage and treatment that were addressed today.  Prostate cancer:  Monitored by Dr. Rosana [name].  Last PSA was <0.01 in 04/2016.  He underwent internal urethrotomy surgery 01/27/16, and hasn't had any significant hematuria since then. He continues to have some urinary frequency.  "I don't have the control that I used to", some urge incontinence.  He saw a dermatologist for some rashes, given a cream which cleared it up, and didn't find anything else wrong.  PMH, [place] SH reviewed  Outpatient Encounter Prescriptions as of 11/18/2016  Medication Sig Note  . acetaminophen (TYLENOL ARTHRITIS PAIN) 650 MG CR tablet Take 1 tablet (650 mg total) by mouth every 8 (eight) hours as needed. 04/28/2015: 2 qam and 2 qpm  . apixaban (ELIQUIS) 5 MG TABS tablet Take 1 tablet (5 mg total) by mouth 2 (two) times daily.   [name] lisinopril (PRINIVIL,ZESTRIL) 20 MG tablet Take 1 tablet (20 mg total) by mouth daily.   [name] lovastatin (MEVACOR) 40 MG tablet Take 2 tablets (80 mg total) by mouth at bedtime.   . metoprolol succinate (TOPROL-XL) 50 MG 24 hr tablet TAKE ONE TABLET BY MOUTH TWICE DAILY WITH OR IMMEDIATELY FOLLOWING A MEAL   . Multiple Vitamins-Minerals (PRESERVISION AREDS) CAPS Take 2 capsules by mouth every morning.    . [DISCONTINUED] hydrocortisone  cream 1 % Apply 1 application topically as needed (for ankles). Reported on 10/29/2015 12/23/2014: Uses prn for rash on ankles  . [DISCONTINUED] lovastatin (MEVACOR) 40 MG tablet TAKE TWO TABLETS AT BEDTIME    No facility-administered encounter medications on file as of 11/18/2016.    No Known Allergies  ROS: no fever, chills, URI symptoms, cough,  shortness of breath, chest pain, edema bleeding, bruising.   Some heartburn/indigestion if he eats too late. Some knee pain Rashes resolved with cream from dermatologist  PHYSICAL EXAM:  BP (!) 146/100 (BP Location: Left Arm, Patient Position: Sitting, Cuff Size: Normal)   Pulse 72   Ht '5\' 11"'  (1.803 m)   Wt 174 lb (78.9 kg)   BMI 24.27 kg/m   Repeat BP was higher (154/104)  Well developed, pleasant male, in no distress, in good spirits. HEENT: PERRL, EOMI, conjunctiva and sclera are clear Neck: no lymphadenopathy or mass. Bruits bilaterally--consistent with radiation of heart murmur into his carotids Heart: Harsh SEM, heard throughout; loudest at RUSB. regular rate and rhythm, no ectopy Lungs: clear bilaterally Back: no CVA or spinal tenderness Abdomen: soft, nontender, no organomegaly or mass Extremities: no edema, 2+ pulses, normal sensation. Neuro: alert and oriented. Normal cranial nerves, gait, strength Psych: normal mood, affect, hygiene and grooming.  Slightly excitable Skin: slight bruising on forearms. No lesions or rash  Lab Results  Component Value Date   HGBA1C 5.9 11/18/2016   ASSESSMENT/PLAN:  Essential hypertension, benign - with significant white coat component.  ACEI dose increased by cardiologist, sounds improved.  recheck chem - Plan: Comprehensive metabolic panel  Paroxysmal atrial fibrillation (HCC) - in NSR, no complications from anticoagulation  Aortic atherosclerosis (HCC)  Left carotid artery stenosis - scheduled for f/u US later this month. Questions answered  Pure hypercholesterolemia  Long term current use of anticoagulant - Plan: CBC with Differential/Platelet  Aortic valve stenosis, etiology of cardiac valve disease unspecified - stable per recent echo  Hypertension associated with diabetes ([place])  Hyperlipidemia associated with type 2 diabetes mellitus ([place])  Type 2 diabetes mellitus with hypercholesterolemia ([place]) - Plan: HgB A1c,  Comprehensive metabolic panel, TSH   c-met (ACEI dose increased in Feb), TSH, CBC (on anticoag) Nonfasting, so lipids not checked.  encouraged to cut back on beer to just 1-2/day  Will send copies of labs to Dr. Fletcher [name].   F/u 6 mos for AWV/med check

## 2016-11-18 ENCOUNTER — Encounter: Payer: Self-pay | Admitting: Family Medicine

## 2016-11-18 ENCOUNTER — Ambulatory Visit (INDEPENDENT_AMBULATORY_CARE_PROVIDER_SITE_OTHER): Payer: Medicare Other | Admitting: Family Medicine

## 2016-11-18 VITALS — BP 146/100 | HR 72 | Ht 71.0 in | Wt 174.0 lb

## 2016-11-18 DIAGNOSIS — Z7901 Long term (current) use of anticoagulants: Secondary | ICD-10-CM

## 2016-11-18 DIAGNOSIS — E7800 Pure hypercholesterolemia, unspecified: Secondary | ICD-10-CM | POA: Diagnosis not present

## 2016-11-18 DIAGNOSIS — I480 Paroxysmal atrial fibrillation: Secondary | ICD-10-CM | POA: Diagnosis not present

## 2016-11-18 DIAGNOSIS — E1159 Type 2 diabetes mellitus with other circulatory complications: Secondary | ICD-10-CM | POA: Diagnosis not present

## 2016-11-18 DIAGNOSIS — I700 Atherosclerosis of aorta: Secondary | ICD-10-CM

## 2016-11-18 DIAGNOSIS — I350 Nonrheumatic aortic (valve) stenosis: Secondary | ICD-10-CM

## 2016-11-18 DIAGNOSIS — I6522 Occlusion and stenosis of left carotid artery: Secondary | ICD-10-CM | POA: Diagnosis not present

## 2016-11-18 DIAGNOSIS — I10 Essential (primary) hypertension: Secondary | ICD-10-CM

## 2016-11-18 DIAGNOSIS — E1169 Type 2 diabetes mellitus with other specified complication: Secondary | ICD-10-CM

## 2016-11-18 DIAGNOSIS — E785 Hyperlipidemia, unspecified: Secondary | ICD-10-CM | POA: Diagnosis not present

## 2016-11-18 LAB — CBC WITH DIFFERENTIAL/PLATELET
Basophils Absolute: 0 cells/uL (ref 0–200)
Basophils Relative: 0 %
EOS PCT: 1 %
Eosinophils Absolute: 100 cells/uL (ref 15–500)
HCT: 41.3 % (ref 38.5–50.0)
HEMOGLOBIN: 14 g/dL (ref 13.2–17.1)
LYMPHS ABS: 2900 {cells}/uL (ref 850–3900)
Lymphocytes Relative: 29 %
MCH: 32.9 pg (ref 27.0–33.0)
MCHC: 33.9 g/dL (ref 32.0–36.0)
MCV: 97.2 fL (ref 80.0–100.0)
MPV: 9.8 fL (ref 7.5–12.5)
Monocytes Absolute: 1300 cells/uL — ABNORMAL HIGH (ref 200–950)
Monocytes Relative: 13 %
NEUTROS PCT: 57 %
Neutro Abs: 5700 cells/uL (ref 1500–7800)
PLATELETS: 323 10*3/uL (ref 140–400)
RBC: 4.25 MIL/uL (ref 4.20–5.80)
RDW: 13.5 % (ref 11.0–15.0)
WBC: 10 10*3/uL (ref 4.0–10.5)

## 2016-11-18 LAB — POCT GLYCOSYLATED HEMOGLOBIN (HGB A1C): Hemoglobin A1C: 5.9

## 2016-11-18 LAB — TSH: TSH: 1.17 m[IU]/L (ref 0.40–4.50)

## 2016-11-18 NOTE — Patient Instructions (Addendum)
Continue your current medications. Continue to get regular exercise--try and get at least 30 minutes/day, even if some of it needs to be indoors. Try and cut back to just 1-2 beers/day. Work on [employer] and portion control also, in order to lose some of the belly weight. I'd love to see inches lost at the waist.  I recommend getting the new shingles vaccine (Shingrix). You will need to check with your insurance to see if it is covered, and if covered by Medicare Part D, you need to get from the pharmacy rather than our office.  It is a series of 2 injections, spaced 2 months apart.

## 2016-11-19 ENCOUNTER — Encounter: Payer: Self-pay | Admitting: Family Medicine

## 2016-11-19 LAB — COMPREHENSIVE METABOLIC PANEL
ALT: 17 U/L (ref 9–46)
AST: 19 U/L (ref 10–35)
Albumin: 4.6 g/dL (ref 3.6–5.1)
Alkaline Phosphatase: 35 U/L — ABNORMAL LOW (ref 40–115)
BUN: 23 mg/dL (ref 7–25)
CHLORIDE: 103 mmol/L (ref 98–110)
CO2: 24 mmol/L (ref 20–31)
CREATININE: 0.92 mg/dL (ref 0.70–1.11)
Calcium: 10.1 mg/dL (ref 8.6–10.3)
Glucose, Bld: 97 mg/dL (ref 65–99)
POTASSIUM: 5.1 mmol/L (ref 3.5–5.3)
Sodium: 141 mmol/L (ref 135–146)
TOTAL PROTEIN: 7.7 g/dL (ref 6.1–8.1)
Total Bilirubin: 0.7 mg/dL (ref 0.2–1.2)

## 2016-11-25 ENCOUNTER — Encounter: Payer: Self-pay | Admitting: Family

## 2016-11-25 ENCOUNTER — Ambulatory Visit (INDEPENDENT_AMBULATORY_CARE_PROVIDER_SITE_OTHER): Payer: Medicare Other | Admitting: Family

## 2016-11-25 ENCOUNTER — Ambulatory Visit (HOSPITAL_COMMUNITY)
Admission: RE | Admit: 2016-11-25 | Discharge: 2016-11-25 | Disposition: A | Discharge status: Home / Self Care | Payer: Medicare Other | Source: Ambulatory Visit | Attending: Vascular Surgery | Admitting: Vascular Surgery

## 2016-11-25 VITALS — BP 203/87 | HR 68 | Temp 97.2°F | Resp 16 | Ht 71.0 in | Wt 172.4 lb

## 2016-11-25 DIAGNOSIS — I6523 Occlusion and stenosis of bilateral carotid arteries: Secondary | ICD-10-CM

## 2016-11-25 DIAGNOSIS — I6522 Occlusion and stenosis of left carotid artery: Secondary | ICD-10-CM

## 2016-11-25 NOTE — Progress Notes (Signed)
Chief Complaint: Follow up Extracranial Carotid Artery Stenosis   History of Present Illness  Jeffrey [name] is a 83 y.o. male whom [name] has been monitoring for asymptomatic carotid stenosis. He continues to deny any symptoms of TIA amaurosis or stroke. Specifically he denies a history of amaurosis fugax or monocular blindness, unilateral facial drooping, hemiplegia, or receptive or expressive aphasia.  He was quite ill early in 2017 in the intensive care unit, has recovered significantly.  He is on Eliquis for atrial fibrillation. His primary care physician is Aris [place]. Chronic medical problems include hypertension elevated cholesterol and diabetes atrial fibrillation all of which are currently stable.  Patient has not had previous carotid artery intervention.  [name] last evaluated pt on 11-06-15. At that time carotid duplex showed 40-60% left internal carotid artery stenosis with essentially no change from August 2016. However, velocities were low enough that he was downgraded by category. Right side less than 40% stenosis. Asymptomatic moderate left internal carotid artery stenosis probably on the level of 50-60% [name] advised pt to follow-up in 1 year with repeat carotid duplex scan or sooner if develops symptoms of TIA amaurosis or stroke.  He states his blood pressure at home runs 130-150's/70-80.  Pt states his blood pressure varies so much for years, that he keeps a log of his blood pressures for his PCP, states his blood pressure goes up in a doctor office.   Pt Diabetic: yes, last A1C result on file was 5.9 on 11-18-16, well controlled Pt smoker: former smoker, quit in 1982  Pt meds include: Statin : yes ASA: no Other anticoagulants/antiplatelets: Eliquis for atrial fib   Past Medical History:  Diagnosis Date  . Acute CVA (cerebrovascular accident) ([place])   . Acute encephalopathy 11/27/2014  . Acute on chronic combined systolic and diastolic CHF (congestive  heart failure) ([place])   . Aortic atherosclerosis ([place]) 11/25/2014  . Atrial fibrillation ([place])   . Diabetes mellitus   . Heart murmur    mild aortic stenosis  . Hypercholesteremia   . Hypertension   . Impaired fasting glucose   . Macular degeneration   . Multiple lacunar infarcts ([place])   . Prostate cancer ([place]) radiation + seeding implant (Dr.[name])  . 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 Hilarie Fredrickson)  . [facility] spotted fever) 10/2014   (hosp with FUO, encephalopathy)  . Rotator cuff tear, right 10/2010   supraspinatous and infraspinatous  . TIA (transient ischemic attack) 10/02/13   hosp at [facility] x 1 night  . Type 2 diabetes mellitus with microalbuminuria or microproteinuria 01/15/2014    Social History Social History  Substance Use Topics  . Smoking status: Former Smoker    Packs/day: 1.00    Years: 20.00    Types: Cigarettes    Quit date: 05/31/1980  . Smokeless tobacco: Never Used  . Alcohol use 4.2 oz/week    7 Cans of beer per week     Comment: can of beer daily    Family History Family History  Problem Relation Age of Onset  . Heart disease Father 42       Died suddenly.  No diagnosis  . Stroke Sister   . Dementia Mother   . Diabetes Mother   . Heart disease Mother   . Hypertension Mother   . Cancer Daughter        ovarian (?) vs other male cancer; s/p hyst doing well  . Diabetes  Daughter        GDM, and now AODM  . Heart disease Son        congestive heart failure--improved    Surgical History Past Surgical History:  Procedure Laterality Date  . COLONOSCOPY N/A 11/28/2012   Procedure: COLONOSCOPY;  Surgeon: Jerene Bears, MD;  Location: [facility] ENDOSCOPY;  Service: Gastroenterology;  Laterality: N/A;  . INTERNAL URETHROTOMY  01/27/2016   WF  . prostate seed implant    . ROTATOR CUFF REPAIR  06/16/2011   right (Dr. Berenice Primas)  . TONSILLECTOMY AND ADENOIDECTOMY  age 68    No Known Allergies  Current  Outpatient Prescriptions  Medication Sig Dispense Refill  . acetaminophen (TYLENOL ARTHRITIS PAIN) 650 MG CR tablet Take 1 tablet (650 mg total) by mouth every 8 (eight) hours as needed.    [name] apixaban (ELIQUIS) 5 MG TABS tablet Take 1 tablet (5 mg total) by mouth 2 (two) times daily. 180 tablet 3  . lovastatin (MEVACOR) 40 MG tablet Take 2 tablets (80 mg total) by mouth at bedtime. 180 tablet 1  . metoprolol succinate (TOPROL-XL) 50 MG 24 hr tablet TAKE ONE TABLET BY MOUTH TWICE DAILY WITH OR IMMEDIATELY FOLLOWING A MEAL 180 tablet 1  . Multiple Vitamins-Minerals (PRESERVISION AREDS) CAPS Take 2 capsules by mouth every morning.     [name] lisinopril (PRINIVIL,ZESTRIL) 20 MG tablet Take 1 tablet (20 mg total) by mouth daily. 90 tablet 3   No current facility-administered medications for this visit.     Review of Systems : See HPI for pertinent positives and negatives.  Physical Examination  Vitals:   11/25/16 1214 11/25/16 1215  BP: (!) 193/89 (!) 203/87  Pulse: 68   Resp: 16   Temp: 97.2 F (36.2 C)   TempSrc: Oral   SpO2: 96%   Weight: 172 lb 6.4 oz (78.2 kg)   Height: 5\' 11"  (1.803 m)    Body mass index is 24.04 kg/m.  General: WDWN male in NAD GAIT: normal Eyes: PERRLA Pulmonary:  Respirations are non-labored, good air movement, CTAB, no rales,  rhonchi, or wheezing.  Cardiac: Irregular rhythm with controlled rate, +murmur.  VASCULAR EXAM Carotid Bruits Right Left   Transmitted cardiac murmur transmitted cardiac murmur    Aorta is not palpable. Radial pulses are 2+ palpable and equal.                                                                                                                            LE Pulses Right Left       POPLITEAL  not palpable   not palpable       POSTERIOR TIBIAL  faintly palpable   faintly palpable        DORSALIS PEDIS      ANTERIOR TIBIAL not palpable  not palpable     Gastrointestinal: soft, nontender, BS WNL, no r/g, no palpable  masses.  Musculoskeletal: No muscle atrophy/wasting. M/S 5/5 throughout, extremities without ischemic  changes. Feet and hands are cool to touch and ruddy.   Neurologic:  A&O X 3; appropriate affect, sensation is normal; speech is normal, CN 2-12 intact, pain and light touch intact in extremities, motor exam as listed above. Fine tremor of head and hands.     Assessment: Jeffrey [name] is a 83 y.o. male who has no history of stroke or TIA.   He states his blood pressure at home runs 130-150's/70-80 and increases in a medical setting.   DATA Carotid Duplex (11/25/16): Right ICA: 1-39% stenosis Left ICA: 40-59% stenosis Bilateral vertebral artery flow is antegrade.  Bilateral subclavian artery waveforms are normal.  No significant change compared to the last exam on 11-06-15.    Plan: Follow-up in 1 year with Carotid Duplex scan.   I discussed in depth with the patient the nature of atherosclerosis, and emphasized the importance of maximal medical management including strict control of blood pressure, blood glucose, and lipid levels, obtaining regular exercise, and continued cessation of smoking.  The patient is aware that without maximal medical management the underlying atherosclerotic disease process will progress, limiting the benefit of any interventions. The patient was given information about stroke prevention and what symptoms should prompt the patient to seek immediate medical care. Thank you for allowing [place] to participate in this patient's care.  Clemon Chambers, RN, MSN, FNP-C [facility] Office: [phone]  Clinic Physician: Oneida Alar  11/25/16 12:23 PM

## 2016-11-25 NOTE — Patient Instructions (Signed)
Stroke Prevention Some medical conditions and behaviors are associated with an increased chance of having a stroke. You may prevent a stroke by making healthy choices and managing medical conditions. How can I reduce my risk of having a stroke?  Stay physically active. Get at least 30 minutes of activity on most or all days.  Do not smoke. It may also be helpful to avoid exposure to secondhand smoke.  Limit alcohol use. Moderate alcohol use is considered to be:  No more than 2 drinks per day for men.  No more than 1 drink per day for nonpregnant women.  Eat healthy foods. This involves:  Eating 5 or more servings of fruits and vegetables a day.  Making dietary changes that address high blood pressure (hypertension), high cholesterol, diabetes, or obesity.  Manage your cholesterol levels.  Making food choices that are high in fiber and low in saturated fat, trans fat, and cholesterol may control cholesterol levels.  Take any prescribed medicines to control cholesterol as directed by your health care provider.  Manage your diabetes.  Controlling your carbohydrate and sugar intake is recommended to manage diabetes.  Take any prescribed medicines to control diabetes as directed by your health care provider.  Control your hypertension.  Making food choices that are low in salt (sodium), saturated fat, trans fat, and cholesterol is recommended to manage hypertension.  Ask your health care provider if you need treatment to lower your blood pressure. Take any prescribed medicines to control hypertension as directed by your health care provider.  If you are 18-39 years of age, have your blood pressure checked every 3-5 years. If you are 40 years of age or older, have your blood pressure checked every year.  Maintain a healthy weight.  Reducing calorie intake and making food choices that are low in sodium, saturated fat, trans fat, and cholesterol are recommended to manage  weight.  Stop drug abuse.  Avoid taking birth control pills.  Talk to your health care provider about the risks of taking birth control pills if you are over 35 years old, smoke, get migraines, or have ever had a blood clot.  Get evaluated for sleep disorders (sleep apnea).  Talk to your health care provider about getting a sleep evaluation if you snore a lot or have excessive sleepiness.  Take medicines only as directed by your health care provider.  For some people, aspirin or blood thinners (anticoagulants) are helpful in reducing the risk of forming abnormal blood clots that can lead to stroke. If you have the irregular heart rhythm of atrial fibrillation, you should be on a blood thinner unless there is a good reason you cannot take them.  Understand all your medicine instructions.  Make sure that other conditions (such as anemia or atherosclerosis) are addressed. Get help right away if:  You have sudden weakness or numbness of the face, arm, or leg, especially on one side of the body.  Your face or eyelid droops to one side.  You have sudden confusion.  You have trouble speaking (aphasia) or understanding.  You have sudden trouble seeing in one or both eyes.  You have sudden trouble walking.  You have dizziness.  You have a loss of balance or coordination.  You have a sudden, severe headache with no known cause.  You have new chest pain or an irregular heartbeat. Any of these symptoms may represent a serious problem that is an emergency. Do not wait to see if the symptoms will go away.   Get medical help at once. Call your local emergency services (911 in [place] Do not drive yourself to the hospital. This information is not intended to replace advice given to you by your health care provider. Make sure you discuss any questions you have with your health care provider. Document Released: 06/24/2004 Document Revised: 10/23/2015 Document Reviewed: 11/17/2012 Elsevier  Interactive Patient Education  2017 [employer]  

## 2016-12-03 DIAGNOSIS — N35013 Post-traumatic anterior urethral stricture: Secondary | ICD-10-CM | POA: Diagnosis not present

## 2016-12-03 DIAGNOSIS — I4891 Unspecified atrial fibrillation: Secondary | ICD-10-CM | POA: Diagnosis not present

## 2016-12-03 DIAGNOSIS — K627 Radiation proctitis: Secondary | ICD-10-CM | POA: Diagnosis not present

## 2016-12-03 DIAGNOSIS — C61 Malignant neoplasm of prostate: Secondary | ICD-10-CM | POA: Diagnosis not present

## 2016-12-03 DIAGNOSIS — N481 Balanitis: Secondary | ICD-10-CM | POA: Diagnosis not present

## 2016-12-03 DIAGNOSIS — N471 Phimosis: Secondary | ICD-10-CM | POA: Diagnosis not present

## 2016-12-08 DIAGNOSIS — H25811 Combined forms of age-related cataract, right eye: Secondary | ICD-10-CM | POA: Diagnosis not present

## 2016-12-08 DIAGNOSIS — H2511 Age-related nuclear cataract, right eye: Secondary | ICD-10-CM | POA: Diagnosis not present

## 2016-12-08 NOTE — Addendum Note (Signed)
Addended by: Lianne Cure A on: 12/08/2016 02:32 PM   Modules accepted: Orders

## 2016-12-10 ENCOUNTER — Other Ambulatory Visit: Payer: Self-pay | Admitting: Family Medicine

## 2017-01-13 ENCOUNTER — Other Ambulatory Visit: Payer: Self-pay | Admitting: Family Medicine

## 2017-01-17 DIAGNOSIS — I4891 Unspecified atrial fibrillation: Secondary | ICD-10-CM | POA: Diagnosis not present

## 2017-01-17 DIAGNOSIS — N481 Balanitis: Secondary | ICD-10-CM | POA: Diagnosis not present

## 2017-01-17 DIAGNOSIS — K627 Radiation proctitis: Secondary | ICD-10-CM | POA: Diagnosis not present

## 2017-01-17 DIAGNOSIS — N471 Phimosis: Secondary | ICD-10-CM | POA: Diagnosis not present

## 2017-01-17 DIAGNOSIS — N359 Urethral stricture, unspecified: Secondary | ICD-10-CM | POA: Diagnosis not present

## 2017-01-17 DIAGNOSIS — Z8546 Personal history of malignant neoplasm of prostate: Secondary | ICD-10-CM | POA: Diagnosis not present

## 2017-01-26 ENCOUNTER — Telehealth: Payer: Self-pay | Admitting: Cardiovascular Disease

## 2017-01-26 NOTE — Telephone Encounter (Signed)
Received request from Dr. Tresa Endo, [facility], to hold eliquis 3 days prior to 02/15/17 urology surgery. Routed to [name].

## 2017-01-27 NOTE — Telephone Encounter (Signed)
Letter in [place]. Faxed to Dr. Duane Lope. Rosana Hoes, [phone]

## 2017-01-27 NOTE — Telephone Encounter (Signed)
His renal function is normal so Eliquis should be held for only 2 days before the surgery especially that he had a stroke in the past.

## 2017-01-29 HISTORY — PX: CIRCUMCISION: SHX1350

## 2017-01-29 HISTORY — PX: CATARACT EXTRACTION: SUR2

## 2017-02-07 DIAGNOSIS — G4719 Other hypersomnia: Secondary | ICD-10-CM | POA: Diagnosis not present

## 2017-02-07 DIAGNOSIS — Z01818 Encounter for other preprocedural examination: Secondary | ICD-10-CM | POA: Diagnosis not present

## 2017-02-07 DIAGNOSIS — I10 Essential (primary) hypertension: Secondary | ICD-10-CM | POA: Diagnosis not present

## 2017-02-07 DIAGNOSIS — R0683 Snoring: Secondary | ICD-10-CM | POA: Diagnosis not present

## 2017-02-07 DIAGNOSIS — Z8679 Personal history of other diseases of the circulatory system: Secondary | ICD-10-CM | POA: Diagnosis not present

## 2017-02-07 DIAGNOSIS — Z8673 Personal history of transient ischemic attack (TIA), and cerebral infarction without residual deficits: Secondary | ICD-10-CM | POA: Diagnosis not present

## 2017-02-07 DIAGNOSIS — E119 Type 2 diabetes mellitus without complications: Secondary | ICD-10-CM | POA: Diagnosis not present

## 2017-02-07 DIAGNOSIS — I480 Paroxysmal atrial fibrillation: Secondary | ICD-10-CM | POA: Diagnosis not present

## 2017-02-08 ENCOUNTER — Telehealth: Payer: Self-pay | Admitting: *Deleted

## 2017-02-08 NOTE — Telephone Encounter (Signed)
noted 

## 2017-02-08 NOTE — Telephone Encounter (Signed)
Called patient re: note from [facility] to see if sleep study that was rec was set up. Patient states that he is not interested in doing sleep study at this time but thanked you for being so "on top of things". States that maybe this would be something that could be addresses=d at a later date but just not at this time.

## 2017-02-15 DIAGNOSIS — Z7902 Long term (current) use of antithrombotics/antiplatelets: Secondary | ICD-10-CM | POA: Diagnosis not present

## 2017-02-15 DIAGNOSIS — I10 Essential (primary) hypertension: Secondary | ICD-10-CM | POA: Diagnosis not present

## 2017-02-15 DIAGNOSIS — Z8546 Personal history of malignant neoplasm of prostate: Secondary | ICD-10-CM | POA: Diagnosis not present

## 2017-02-15 DIAGNOSIS — R7303 Prediabetes: Secondary | ICD-10-CM | POA: Diagnosis not present

## 2017-02-15 DIAGNOSIS — I6522 Occlusion and stenosis of left carotid artery: Secondary | ICD-10-CM | POA: Diagnosis not present

## 2017-02-15 DIAGNOSIS — E785 Hyperlipidemia, unspecified: Secondary | ICD-10-CM | POA: Diagnosis not present

## 2017-02-15 DIAGNOSIS — N471 Phimosis: Secondary | ICD-10-CM | POA: Diagnosis not present

## 2017-02-15 DIAGNOSIS — I4891 Unspecified atrial fibrillation: Secondary | ICD-10-CM | POA: Diagnosis not present

## 2017-02-15 DIAGNOSIS — I350 Nonrheumatic aortic (valve) stenosis: Secondary | ICD-10-CM | POA: Diagnosis not present

## 2017-02-15 DIAGNOSIS — Z79899 Other long term (current) drug therapy: Secondary | ICD-10-CM | POA: Diagnosis not present

## 2017-02-15 DIAGNOSIS — R011 Cardiac murmur, unspecified: Secondary | ICD-10-CM | POA: Diagnosis not present

## 2017-02-15 DIAGNOSIS — M1990 Unspecified osteoarthritis, unspecified site: Secondary | ICD-10-CM | POA: Diagnosis not present

## 2017-02-15 DIAGNOSIS — N481 Balanitis: Secondary | ICD-10-CM | POA: Diagnosis not present

## 2017-02-15 DIAGNOSIS — N359 Urethral stricture, unspecified: Secondary | ICD-10-CM | POA: Diagnosis not present

## 2017-02-15 DIAGNOSIS — K627 Radiation proctitis: Secondary | ICD-10-CM | POA: Diagnosis not present

## 2017-02-15 DIAGNOSIS — Z8673 Personal history of transient ischemic attack (TIA), and cerebral infarction without residual deficits: Secondary | ICD-10-CM | POA: Diagnosis not present

## 2017-02-15 DIAGNOSIS — H9190 Unspecified hearing loss, unspecified ear: Secondary | ICD-10-CM | POA: Diagnosis not present

## 2017-02-15 DIAGNOSIS — Z87891 Personal history of nicotine dependence: Secondary | ICD-10-CM | POA: Diagnosis not present

## 2017-02-15 DIAGNOSIS — N358 Other urethral stricture: Secondary | ICD-10-CM | POA: Diagnosis not present

## 2017-03-04 DIAGNOSIS — Z23 Encounter for immunization: Secondary | ICD-10-CM | POA: Diagnosis not present

## 2017-03-07 ENCOUNTER — Other Ambulatory Visit: Payer: Self-pay | Admitting: *Deleted

## 2017-03-31 ENCOUNTER — Other Ambulatory Visit: Payer: Self-pay | Admitting: Cardiovascular Disease

## 2017-04-30 ENCOUNTER — Other Ambulatory Visit: Payer: Self-pay | Admitting: Family Medicine

## 2017-05-02 ENCOUNTER — Other Ambulatory Visit: Payer: Self-pay | Admitting: Cardiovascular Disease

## 2017-05-02 NOTE — Telephone Encounter (Signed)
Refill Request.  

## 2017-05-05 ENCOUNTER — Other Ambulatory Visit: Payer: Self-pay

## 2017-05-26 NOTE — Progress Notes (Signed)
Chief Complaint  Patient presents with  . Medicare Wellness    fasting medicare wellness exam. No concerns today.     Jeffrey [name] is a 83 y.o. male who presents for annual wellness visit and follow-up on chronic medical conditions.  He has no complaints today.  His wife recently broke her arm, so he has been helping care for her, a little less active than usual.  Hypertension: [name] increased lisinopril dose to 20 mg daily at his last visit in February.  Per that last note, he was to f/u in 6 months, but doesn't appear to have done so. He states he has an upcoming appointment, but I don't see one in the computer.  He has a significant white coat component to his blood pressures.  BP's have been running [id number] in the last week (he checks BP frequently, but only starts recording it a month prior to a visit--this visit was off a cancellation list, so only has a few values written down).  He always sees lower values when he is active. He hasn't been exercising as much in the last week, his wife broke his arm and he has been helping her.  He denies headaches, lightheadedness, chest pain, palpitations, shortness of breath, edema. Reports compliance with his medications. He does not have any symptoms today, with his elevated pressure.  Paroxysmal atrial fibrillation--Cardiologist recommends lifelong anticoagulation. Patient denies any significant bleeding/bruising or side effects from medication.He last saw [name] in 07/2016, and had f/u echo which showed normal EF with stable mild aortic stenosis.   DM/IFG--He doesn't check his sugars. He goes to the eye doctor regularly, for macular degeneration, last visit 08/2016 (also had right cataract removal in September). Vision is significantly better in the right eye s/p surgery. He may also have surgery on the left.  He denies any feet concerns, numbness/tingling.He has urinary frequency x several years (related to prostate), no change; denies  polydipsia. Drinking 2 beers/day.   Hyperlipidemia: Compliant with medications; denies side effects. Tries to follow a lowfat, low cholesterol diet, cut back on fried foods. Lab Results  Component Value Date   CHOL 166 06/02/2016   HDL 66 06/02/2016   LDLCALC 72 06/02/2016   TRIG 140 06/02/2016   CHOLHDL 2.5 06/02/2016    H/o stroke in 10/2014(right frontal white matter infarct in June 2016 secondary to small vessel disease).  He has asymptomatic L carotid artery stenosis, and is monitored by [name] for this. Repeat US was done 6/28//18, without significant change (40-59% L ICA, <39% R ICA). He denies any neurologic symptoms.   Prostate cancer: Monitored by [name]. When he saw [name] in July he was also having issues with phimosis and balanitis.  He had circumcision done in September, and has some ongoing soreness/tenderness. He also was having recurrent urethral stricture, and repeat cystoscopy was done at the same time.  He doesn't notice any improvement, continues with frequency and leakage.  Last PSA was < 0.01 12/03/16. A sleep study was recommended (at a pre-op visit at [facility], prior to circumcision). He is not interested in this. Denies apnea.  Immunization History  Administered Date(s) Administered  . Influenza Split 01/30/2012, 02/03/2014, 03/25/2015  . Influenza, High Dose Seasonal PF 03/17/2016, 03/04/2017  . Influenza-Unspecified 01/29/2013, 03/25/2015  . Pneumococcal Conjugate-13 10/17/2014  . Pneumococcal Polysaccharide-23 12/17/2004, 10/02/2012  . Td 10/29/2004   Tdap recommended the last 2 years (needs to get from pharmacy)--he recalls there being issues with insurance coverage Last  colonoscopy: 11/2012 with Dr. Hilarie Fredrickson (radiation proctitis)  Last PSA: per urologist, every 6 months (last in 11/2016, <0.01) Dentist: twice yearly Ophtho: twice yearly  Exercise: Walks the dogs usually 2x/day, about a city block (10-15 minutes). Gardening, spends a lot of  time doing yardwork, mows an acre ([employer]) in season, but now has been raking leaves.   Other doctors caring for patient include:  GI--Dr. Hilarie Fredrickson  Urologist: [name]  Dentist: Dr.Taloopis Ophtho: Dr. Shea Stakes (at [facility] Dr. Katherene Ponto Cardiologist: [name] Ortho: Dr. Berenice Primas (no longer under his care; problem resolved)  Neuro: [name] Vascular: Dr. Roseanna Rainbow:  Can't recall the name, saw someone in [place]   Depression screen: negative (see screen in epic). Fall screen: none in the last year ADL screen--notable for decreased hearing (doesn't think he is ready for hearing aids yet, trouble in crowds only, if a few people are over and talking, with TV on), and vision (macular degeneration; had cataract surgery 3 mos ago and vision in R eye has improved), leakage of urine, urge incontinence (wears Depends)--see screen in [facility].  End of Life Discussion: He has been given forms in the past, but admits to having a hard time thinking about those sorts of things, so hasn't done the healthcare POA or living will.  Past Medical History:  Diagnosis Date  . Acute CVA (cerebrovascular accident) ([place])   . Acute encephalopathy 11/27/2014  . Acute on chronic combined systolic and diastolic CHF (congestive heart failure) ([place])   . Aortic atherosclerosis ([place]) 11/25/2014  . Atrial fibrillation ([place])   . Diabetes mellitus   . Heart murmur    mild aortic stenosis  . Hypercholesteremia   . Hypertension   . Impaired fasting glucose   . Macular degeneration   . Multiple lacunar infarcts   . Prostate cancer ([place]) radiation + seeding implant (Dr.[name])  . 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 Hilarie Fredrickson)  . [facility] spotted fever) 10/2014   (hosp with FUO, encephalopathy)  . Rotator cuff tear, right 10/2010   supraspinatous and infraspinatous  . TIA (transient ischemic attack) 10/02/13   hosp at [facility] x 1 night  . Type 2 diabetes  mellitus with microalbuminuria or microproteinuria 01/15/2014    Past Surgical History:  Procedure Laterality Date  . CATARACT EXTRACTION Right 01/2017  . CIRCUMCISION  01/2017   [name]  . COLONOSCOPY N/A 11/28/2012   Procedure: COLONOSCOPY;  Surgeon: Jerene Bears, MD;  Location: [facility] ENDOSCOPY;  Service: Gastroenterology;  Laterality: N/A;  . INTERNAL URETHROTOMY  01/27/2016   WF  . prostate seed implant    . ROTATOR CUFF REPAIR  06/16/2011   right (Dr. Berenice Primas)  . TONSILLECTOMY AND ADENOIDECTOMY  age 35    Social History   Socioeconomic History  . Marital status: Married    Spouse name: Not on file  . Number of children: 4  . Years of education: Not on file  . Highest education level: Not on file  Social Needs  . Financial resource strain: Not on file  . Food insecurity - worry: Not on file  . Food insecurity - inability: Not on file  . Transportation needs - medical: Not on file  . Transportation needs - non-medical: Not on file  Occupational History  . Occupation: Retired  Tobacco Use  . Smoking status: Former Smoker    Packs/day: 1.00    Years: 20.00    Pack years: 20.00  Types: Cigarettes    Last attempt to quit: 05/31/1980    Years since quitting: 37.0  . Smokeless tobacco: Never Used  Substance and Sexual Activity  . Alcohol use: Yes    Alcohol/week: 8.4 oz    Types: 14 Cans of beer per week    Comment: 2 cans of beer most days of the week  . Drug use: No  . Sexual activity: Not Currently    Partners: Female    Comment: issues with ED  Other Topics Concern  . Not on file  Social History Narrative   Married. Retired Engineer, structural.  2 sons, 2 daughters (all in [place]), 15 grandchildren, 7 great grandchild, 2 more expected    Family History  Problem Relation Age of Onset  . Heart disease Father 27       Died suddenly.  No diagnosis  . Stroke Sister   . Dementia Mother   . Diabetes Mother   . Heart disease Mother   . Hypertension Mother   . Cancer  Daughter        ovarian (?) vs other male cancer; s/p hyst doing well  . Diabetes Daughter        GDM, and now AODM  . Heart disease Son        congestive heart failure--improved    Outpatient Encounter Medications as of 05/27/2017  Medication Sig Note  . acetaminophen (TYLENOL ARTHRITIS PAIN) 650 MG CR tablet Take 1 tablet (650 mg total) by mouth every 8 (eight) hours as needed. 04/28/2015: 2 qam and 2 qpm  . betamethasone dipropionate (DIPROLENE) 0.05 % ointment Apply topically 2 (two) times daily.   [name] ELIQUIS 5 MG TABS tablet TAKE ONE TABLET BY MOUTH TWICE DAILY   . lisinopril (PRINIVIL,ZESTRIL) 20 MG tablet TAKE ONE TABLET BY MOUTH EVERY DAY   . lovastatin (MEVACOR) 40 MG tablet Take 2 tablets (80 mg total) by mouth at bedtime.   . metoprolol succinate (TOPROL-XL) 50 MG 24 hr tablet TAKE ONE TABLET BY MOUTH TWICE DAILY WITH OR IMMEDIATELY FOLLOWING A MEAL   . Multiple Vitamins-Minerals (PRESERVISION AREDS) CAPS Take 2 capsules by mouth every morning.    . [DISCONTINUED] lovastatin (MEVACOR) 40 MG tablet TAKE 2 TABLETS BY MOUTH AT BEDTIME    No facility-administered encounter medications on file as of 05/27/2017.     No Known Allergies   ROS: The patient denies anorexia, fever, weight changes, headaches, ear pain, hoarseness, chest pain, palpitations, dyspnea on exertion, cough, swelling, nausea, vomiting, diarrhea, constipation, abdominal pain, melena, hematochezia, indigestion/heartburn, weakened urine stream, dysuria, genital lesions, joint pains (just arthritis in hands, relieved by tylenol), numbness, tingling, weakness, suspicious skin lesions, depression, anxiety, abnormal bleeding/bruising, or enlarged lymph nodes.  +bilateral hearing loss, L>R  Some chronic runny nose. Vision loss--worse at night, doesn't drive at night. Known macular degeneration. Vision in right eye is much improved s/p cataract surgery.  May have left surgery in the future. Has some urge incontinence,  wears Depends. Still has urinary frequency, sometimes as often as 10x/night, other times just 4x/night. (worse than last year) +ED Some fatigue in the afternoons. Slight tremor (both hands, intermittent, slightly more frequent than last year) Rash--responds to the betamethasone--resolved at his legs, but recurs at his neck area. He also used it on his SK on face, and it is smaller.     PHYSICAL EXAM:  BP (!) 230/110   Pulse 64   Ht 5' 9.25" (1.759 m)   Wt 168 lb 6.4 oz (  76.4 kg)   BMI 24.69 kg/m    218/110 on repeat by MD, in L arm, which was somewhat tremulous, shaking.  Wt Readings from Last 3 Encounters:  05/27/17 168 lb 6.4 oz (76.4 kg)  11/25/16 172 lb 6.4 oz (78.2 kg)  11/18/16 174 lb (78.9 kg)     General Appearance:  Alert, cooperative, no distress, appears stated age  Head:  Normocephalic, without obvious abnormality, atraumatic   Eyes:  PERRL, conjunctiva/corneas clear, EOM's intact, fundi not well visualized on left due to cataract, clear on right  Ears:  Normal TM's and external ear canals   Nose:  Nares normal, mucosa normal, no drainage or sinus tenderness   Throat:  Lips, mucosa, and tongue normal; gums normal.   Neck:  Supple, no lymphadenopathy; thyroid: no enlargement/tenderness/nodules; some radiation of murmur up to carotids. no JVD   Back:  Spine nontender, no curvature, ROM normal, no CVA tenderness   Lungs:  Clear to auscultation bilaterally without wheezes, rales or ronchi; respirations unlabored   Chest Wall:  No tenderness or deformity   Heart:  Regular rate and rhythm; S1 and S2 normal, no rub or gallop. 3/6 SEM heard throughout, loudest at RUSB.  Breast Exam:  No chest wall tenderness, masses or gynecomastia   Abdomen:  Soft, non-tender, nondistended, normoactive bowel sounds, no masses, no hepatosplenomegaly   Genitalia:  Deferred to urologist   Rectal:  Deferred   Extremities:  No clubbing, cyanosis or edema    Pulses:  2+ and symmetric all extremities   Skin:  Skin color, texture, turgor normal, no rashes or lesions; Large SK on right face-- 35mm x 23mm, but less pigmented than in the past (remains the same size overall); SK's, skin tags and angiomas scattered. He did not change into gown for full skin exam (due to urinary frequency, many trips to bathroom)  Lymph nodes:  Cervical, supraclavicular, and axillary nodes normal   Neurologic:  CNII-XII intact, normal strength, sensation and gait; reflexes 2+ and symmetric throughout. Mild resting tremor, more significant on the left   Psych:  Normal mood, affect, hygiene and grooming.   Diabetic foot exam performed--left 3rd and 4th toenails have whitish discoloration.  Some thickening of great toenails and left second toe--very long, and wrapped around to the bottom of the toe. The one on the right (2nd) has been clipped, a little too short, slightly red/healing. Normal monofilament exam, just slight decrease in sensation at the bottom of the right great toe.  Lab Results  Component Value Date   HGBA1C 6.1 05/27/2017     ASSESSMENT/PLAN:  Medicare annual wellness visit, subsequent  Hyperlipidemia associated with type 2 diabetes mellitus ([place])  Hypertension associated with diabetes ([place]) - known white coat component; BP very high today, asymptomatic. Encouraged f/u with [name], and to re-verify monitor's accuracy  Paroxysmal atrial fibrillation ([place]) - on anticoagulation without  complication  Aortic atherosclerosis ([facility])  Left carotid artery stenosis - stable  Pure hypercholesterolemia - Plan: Lipid panel  Long term current use of anticoagulant - Plan: CBC with Differential/Platelet  Aortic valve stenosis, etiology of cardiac valve disease unspecified  Type 2 diabetes mellitus with hypercholesterolemia ([facility])  Type 2 diabetes mellitus without complication, without long-term current use of insulin ([place]) -  Plan: HgB A1c, Comprehensive metabolic panel, Microalbumin / creatinine urine ratio  Type 2 diabetes mellitus with microalbuminuria, without long-term current use of insulin ([place])  Denies needing refills.  Recommended at least 30 minutes of aerobic activity at  least 5 days/week, weight-bearing exercise 2x/wk; proper sunscreen use reviewed; healthy diet and alcohol recommendations (1 drink/day or less preferred, occasionally 2) reviewed; regular seatbelt use; changing batteries in smoke detectors. Immunization recommendations --past due for Tdap (recommended at his Wellness visits the last 2 years, for him to get at the pharmacy when he got Part D coverage)--given new written Rx for Tdap and Shingrix and will have pharmacy check is insurance/supplements. Shingrix is also recommended, risks/side effects reviewed. Advised that if he doesn't have Part D coverage, I recommend him paying for TdaP. Colonoscopy recommendations reviewed--UTD.  Full Code, Full Care Discussed living will and healthcare POA at length, and given new forms.   F/u 6 mos   You are past due to follow up with [name].  Please call their office to check on when they want to see you again next and schedule an appointment. Derm for skin recheck recommended.  Please get TdaP and Shingrix vaccines from the pharmacy (they are not covered by your Medicare to get through my office, need to get through Part D from the pharmacy).     Medicare Attestation I have personally reviewed: The patient's medical and social history Their use of alcohol, tobacco or illicit drugs Their current medications and supplements The patient's functional ability including ADLs,fall risks, home safety risks, cognitive, and hearing and visual impairment Diet and physical activities Evidence for depression or mood disorders  The patient's weight, height, and BMI have been recorded in the chart.  I have made referrals, counseling, and provided  education to the patient based on review of the above and I have provided the patient with a written personalized care plan for preventive services.

## 2017-05-27 ENCOUNTER — Ambulatory Visit (INDEPENDENT_AMBULATORY_CARE_PROVIDER_SITE_OTHER): Payer: Medicare Other | Admitting: Family Medicine

## 2017-05-27 ENCOUNTER — Encounter: Payer: Self-pay | Admitting: Family Medicine

## 2017-05-27 VITALS — BP 230/110 | HR 64 | Ht 69.25 in | Wt 168.4 lb

## 2017-05-27 DIAGNOSIS — I10 Essential (primary) hypertension: Secondary | ICD-10-CM | POA: Diagnosis not present

## 2017-05-27 DIAGNOSIS — Z7901 Long term (current) use of anticoagulants: Secondary | ICD-10-CM | POA: Diagnosis not present

## 2017-05-27 DIAGNOSIS — E785 Hyperlipidemia, unspecified: Secondary | ICD-10-CM

## 2017-05-27 DIAGNOSIS — I152 Hypertension secondary to endocrine disorders: Secondary | ICD-10-CM

## 2017-05-27 DIAGNOSIS — I480 Paroxysmal atrial fibrillation: Secondary | ICD-10-CM | POA: Diagnosis not present

## 2017-05-27 DIAGNOSIS — I350 Nonrheumatic aortic (valve) stenosis: Secondary | ICD-10-CM

## 2017-05-27 DIAGNOSIS — E7800 Pure hypercholesterolemia, unspecified: Secondary | ICD-10-CM | POA: Diagnosis not present

## 2017-05-27 DIAGNOSIS — E1159 Type 2 diabetes mellitus with other circulatory complications: Secondary | ICD-10-CM

## 2017-05-27 DIAGNOSIS — Z0000 Encounter for general adult medical examination without abnormal findings: Secondary | ICD-10-CM | POA: Diagnosis not present

## 2017-05-27 DIAGNOSIS — E119 Type 2 diabetes mellitus without complications: Secondary | ICD-10-CM

## 2017-05-27 DIAGNOSIS — E1129 Type 2 diabetes mellitus with other diabetic kidney complication: Secondary | ICD-10-CM

## 2017-05-27 DIAGNOSIS — I700 Atherosclerosis of aorta: Secondary | ICD-10-CM | POA: Diagnosis not present

## 2017-05-27 DIAGNOSIS — I6522 Occlusion and stenosis of left carotid artery: Secondary | ICD-10-CM

## 2017-05-27 DIAGNOSIS — E1169 Type 2 diabetes mellitus with other specified complication: Secondary | ICD-10-CM | POA: Diagnosis not present

## 2017-05-27 DIAGNOSIS — R809 Proteinuria, unspecified: Secondary | ICD-10-CM

## 2017-05-27 LAB — POCT GLYCOSYLATED HEMOGLOBIN (HGB A1C): HEMOGLOBIN A1C: 6.1

## 2017-05-27 NOTE — Patient Instructions (Addendum)
  HEALTH MAINTENANCE RECOMMENDATIONS:  It is recommended that you get at least 30 minutes of aerobic exercise at least 5 days/week (for weight loss, you may need as much as 60-90 minutes). This can be any activity that gets your heart rate up. This can be divided in 10-15 minute intervals if needed, but try and build up your endurance at least once a week.  Weight bearing exercise is also recommended twice weekly.  Eat a healthy diet with lots of vegetables, fruits and fiber.  "Colorful" foods have a lot of vitamins (ie green vegetables, tomatoes, red peppers, etc).  Limit sweet tea, regular sodas and alcoholic beverages, all of which has a lot of calories and sugar.  Up to 2 alcoholic drinks daily may be beneficial for men (unless trying to lose weight, watch sugars).  Drink a lot of water.  Sunscreen of at least SPF 30 should be used on all sun-exposed parts of the skin when outside between the hours of 10 am and 4 pm (not just when at beach or pool, but even with exercise, golf, tennis, and yard work!)  Use a sunscreen that says "broad spectrum" so it covers both UVA and UVB rays, and make sure to reapply every 1-2 hours.  Remember to change the batteries in your smoke detectors when changing your clock times in the spring and fall.  Use your seat belt every time you are in a car, and please drive safely and not be distracted with cell phones and texting while driving.   Mr. Nesheiwat , Thank you for taking time to come for your Medicare Wellness Visit. I appreciate your ongoing commitment to your health goals. Please review the following plan we discussed and let me know if I can assist you in the future.   These are the goals we discussed: Goals    None      This is a list of the screening recommended for you and due dates:  Health Maintenance  Topic Date Due  . Eye exam for diabetics  11/12/1942  . Tetanus Vaccine  10/30/2014  . Complete foot exam   05/19/2017  . Hemoglobin A1C  05/20/2017   . Flu Shot  Completed  . Pneumonia vaccines  Completed   We did your A1c and foot exam today.  I believe that you are past due to follow up with [name].  Please call their office to check on when they want to see you again next and schedule an appointment.  Please get TdaP (tetanus shot) and Shingrix vaccines from the pharmacy (they are not covered by your Medicare to get through my office, need to get through Part D from the pharmacy; the pharmacy should be able to check your coverage/cost).  If you don't have part D, please consider paying out of pocket for the TdaP, as tetanus shots are recommended every 10 years, and the pertussis (whooping cough) booster is recommended once as an adult, and you haven't had this).  I recommend that you see a podiatrist to get your nails clipped.  Your blood pressure was very high, better at home, though not as low as in the past.  Continue to monitor regularly, try and get daily exercise which helps keep it down.  Consider bring your monitor with you to your next visit so we can continue to assess the accuracy of the machine.

## 2017-05-28 DIAGNOSIS — Z7901 Long term (current) use of anticoagulants: Secondary | ICD-10-CM | POA: Insufficient documentation

## 2017-05-28 DIAGNOSIS — E1129 Type 2 diabetes mellitus with other diabetic kidney complication: Secondary | ICD-10-CM | POA: Insufficient documentation

## 2017-05-28 DIAGNOSIS — R809 Proteinuria, unspecified: Secondary | ICD-10-CM

## 2017-05-28 LAB — COMPREHENSIVE METABOLIC PANEL
AG RATIO: 1.8 (calc) (ref 1.0–2.5)
ALT: 13 U/L (ref 9–46)
AST: 19 U/L (ref 10–35)
Albumin: 4.4 g/dL (ref 3.6–5.1)
Alkaline phosphatase (APISO): 38 U/L — ABNORMAL LOW (ref 40–115)
BUN: 17 mg/dL (ref 7–25)
CALCIUM: 9.5 mg/dL (ref 8.6–10.3)
CO2: 29 mmol/L (ref 20–32)
Chloride: 103 mmol/L (ref 98–110)
Creat: 0.78 mg/dL (ref 0.70–1.11)
GLUCOSE: 123 mg/dL — AB (ref 65–99)
Globulin: 2.5 g/dL (calc) (ref 1.9–3.7)
Potassium: 4.6 mmol/L (ref 3.5–5.3)
Sodium: 142 mmol/L (ref 135–146)
Total Bilirubin: 0.6 mg/dL (ref 0.2–1.2)
Total Protein: 6.9 g/dL (ref 6.1–8.1)

## 2017-05-28 LAB — CBC WITH DIFFERENTIAL/PLATELET
BASOS PCT: 0.6 %
Basophils Absolute: 43 cells/uL (ref 0–200)
Eosinophils Absolute: 50 cells/uL (ref 15–500)
Eosinophils Relative: 0.7 %
HCT: 39.8 % (ref 38.5–50.0)
HEMOGLOBIN: 13.4 g/dL (ref 13.2–17.1)
Lymphs Abs: 1613 cells/uL (ref 850–3900)
MCH: 32.4 pg (ref 27.0–33.0)
MCHC: 33.7 g/dL (ref 32.0–36.0)
MCV: 96.4 fL (ref 80.0–100.0)
MONOS PCT: 10.1 %
MPV: 9.9 fL (ref 7.5–12.5)
NEUTROS ABS: 4766 {cells}/uL (ref 1500–7800)
Neutrophils Relative %: 66.2 %
Platelets: 292 10*3/uL (ref 140–400)
RBC: 4.13 10*6/uL — ABNORMAL LOW (ref 4.20–5.80)
RDW: 11.9 % (ref 11.0–15.0)
Total Lymphocyte: 22.4 %
WBC mixed population: 727 cells/uL (ref 200–950)
WBC: 7.2 10*3/uL (ref 3.8–10.8)

## 2017-05-28 LAB — LIPID PANEL
Cholesterol: 174 mg/dL (ref <200)
HDL: 75 mg/dL (ref >40)
LDL CHOLESTEROL (CALC): 78 mg/dL
Non-HDL Cholesterol (Calc): 99 mg/dL (calc) (ref <130)
TRIGLYCERIDES: 124 mg/dL (ref <150)
Total CHOL/HDL Ratio: 2.3 (calc) (ref <5.0)

## 2017-05-28 LAB — MICROALBUMIN / CREATININE URINE RATIO
CREATININE, URINE: 31 mg/dL (ref 20–320)
MICROALB UR: 4.1 mg/dL
Microalb Creat Ratio: 132 mcg/mg creat — ABNORMAL HIGH (ref <30)

## 2017-05-30 ENCOUNTER — Encounter: Payer: Self-pay | Admitting: *Deleted

## 2017-06-05 DIAGNOSIS — N35014 Post-traumatic urethral stricture, male, unspecified: Secondary | ICD-10-CM | POA: Insufficient documentation

## 2017-06-06 DIAGNOSIS — N35014 Post-traumatic urethral stricture, male, unspecified: Secondary | ICD-10-CM | POA: Diagnosis not present

## 2017-06-06 DIAGNOSIS — C61 Malignant neoplasm of prostate: Secondary | ICD-10-CM | POA: Diagnosis not present

## 2017-06-06 DIAGNOSIS — Z8546 Personal history of malignant neoplasm of prostate: Secondary | ICD-10-CM | POA: Diagnosis not present

## 2017-06-06 DIAGNOSIS — N528 Other male erectile dysfunction: Secondary | ICD-10-CM | POA: Diagnosis not present

## 2017-06-06 DIAGNOSIS — I4891 Unspecified atrial fibrillation: Secondary | ICD-10-CM | POA: Diagnosis not present

## 2017-06-10 ENCOUNTER — Other Ambulatory Visit: Payer: Self-pay | Admitting: Family Medicine

## 2017-07-07 DIAGNOSIS — H353112 Nonexudative age-related macular degeneration, right eye, intermediate dry stage: Secondary | ICD-10-CM | POA: Diagnosis not present

## 2017-07-07 DIAGNOSIS — Z961 Presence of intraocular lens: Secondary | ICD-10-CM | POA: Insufficient documentation

## 2017-07-07 DIAGNOSIS — H353124 Nonexudative age-related macular degeneration, left eye, advanced atrophic with subfoveal involvement: Secondary | ICD-10-CM | POA: Diagnosis not present

## 2017-07-07 DIAGNOSIS — H25812 Combined forms of age-related cataract, left eye: Secondary | ICD-10-CM | POA: Insufficient documentation

## 2017-07-07 DIAGNOSIS — H43813 Vitreous degeneration, bilateral: Secondary | ICD-10-CM | POA: Diagnosis not present

## 2017-07-26 ENCOUNTER — Ambulatory Visit (INDEPENDENT_AMBULATORY_CARE_PROVIDER_SITE_OTHER): Payer: Medicare Other | Admitting: Cardiovascular Disease

## 2017-07-26 ENCOUNTER — Encounter: Payer: Self-pay | Admitting: Cardiovascular Disease

## 2017-07-26 VITALS — BP 188/98 | HR 79 | Ht 71.0 in | Wt 172.8 lb

## 2017-07-26 DIAGNOSIS — I480 Paroxysmal atrial fibrillation: Secondary | ICD-10-CM

## 2017-07-26 DIAGNOSIS — I350 Nonrheumatic aortic (valve) stenosis: Secondary | ICD-10-CM | POA: Diagnosis not present

## 2017-07-26 DIAGNOSIS — E785 Hyperlipidemia, unspecified: Secondary | ICD-10-CM | POA: Diagnosis not present

## 2017-07-26 DIAGNOSIS — I10 Essential (primary) hypertension: Secondary | ICD-10-CM | POA: Diagnosis not present

## 2017-07-26 MED ORDER — LISINOPRIL 40 MG PO TABS: 40.0000 mg | ORAL_TABLET | Freq: Every day | ORAL | 3 refills | Status: DC

## 2017-07-26 NOTE — Patient Instructions (Signed)
Medication Instructions:  Your physician has recommended you make the following change in your medication:  INCREASE lisinopril to 40mg  once daily    Labwork: none  Testing/Procedures: none  Follow-Up: Your physician wants you to follow-up in: 6 months with [name].  You will receive a reminder letter in the mail two months in advance. If you don't receive a letter, please call our office to schedule the follow-up appointment.   Any Other Special Instructions Will Be Listed Below (If Applicable).     If you need a refill on your cardiac medications before your next appointment, please call your pharmacy.

## 2017-07-26 NOTE — Progress Notes (Signed)
Cardiology Office Note   Date:  07/26/2017   ID:  Jeffrey [name], Para Jun 26, 1932, MRN [id number]  PCP:  Rita Ohara, MD  Cardiologist:   Kathlyn Sacramento, MD   Chief Complaint  Patient presents with  . OTHER    6 month f/u no complaints today. Meds reviewed verbally with pt.      History of Present Illness: Jeffrey [name] is a 84 y.o. male who presents for a follow-up visit regarding paroxysmal atrial fibrillation.  He has known history of hypertension, hyperlipidemia, TIA, mild aortic stenosis, moderate left carotid stenosis and borderline diabetes mellitus. He had previous stroke in June 2016 and later was found to have [facility] Fever which was treated with doxycycline.   He underwent a nuclear stress test in August of 2016 which showed no evidence of ischemia with normal ejection fraction. Most recent echocardiogram in February 2018 showed normal LV systolic function, mild aortic stenosis with a mean gradient of 15 mmHg and no significant pulmonary hypertension.  He has been doing reasonably well with no recent chest pain or shortness of breath.  Blood pressure continues to be elevated but again he reports lower readings at home.   Past Medical History:  Diagnosis Date  . Acute CVA (cerebrovascular accident) (Jeffrey [name])   . Acute encephalopathy 11/27/2014  . Acute on chronic combined systolic and diastolic CHF (congestive heart failure) ([place])   . Aortic atherosclerosis (Jeffrey [name]) 11/25/2014  . Atrial fibrillation (Jeffrey [name])   . Diabetes mellitus   . Heart murmur    mild aortic stenosis  . Hypercholesteremia   . Hypertension   . Impaired fasting glucose   . Macular degeneration   . Multiple lacunar infarcts   . Prostate cancer (Jeffrey [name]) radiation + seeding implant (Dr.[name])  . 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 Hilarie Fredrickson)  . RMSF [facility] spotted fever) 10/2014   (hosp with FUO, encephalopathy)  . Rotator cuff tear, right  10/2010   supraspinatous and infraspinatous  . TIA (transient ischemic attack) 10/02/13   hosp at [facility] x 1 night  . 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 CATARACT EXTRACTION Right 01/2017  . CIRCUMCISION  01/2017   [name]  . COLONOSCOPY N/A 11/28/2012   Procedure: COLONOSCOPY;  Surgeon: Jerene Bears, MD;  Location: [facility];  Service: Gastroenterology;  Laterality: N/A;  . INTERNAL URETHROTOMY  01/27/2016   WF  . prostate seed implant    . ROTATOR CUFF REPAIR  06/16/2011   right (Dr. Berenice Primas)  . TONSILLECTOMY AND ADENOIDECTOMY  age 12     Current Outpatient Medications  Medication Sig Dispense Refill  . acetaminophen (TYLENOL ARTHRITIS PAIN) 650 MG CR tablet Take 1 tablet (650 mg total) by mouth every 8 (eight) hours as needed.    . betamethasone dipropionate (DIPROLENE) 0.05 % ointment Apply topically 2 (two) times daily.    [name] ELIQUIS 5 MG TABS tablet TAKE ONE TABLET BY MOUTH TWICE DAILY 180 tablet 3  . lisinopril (PRINIVIL,ZESTRIL) 20 MG tablet TAKE ONE TABLET BY MOUTH EVERY DAY 90 tablet 3  . lovastatin (MEVACOR) 40 MG tablet Take 2 tablets (80 mg total) by mouth at bedtime. 180 tablet 1  . metoprolol succinate (TOPROL-XL) 50 MG 24 hr tablet TAKE 1 TABLET BY MOUTH TWICE DAILY WITH OR IMMEDIATLEY FOLLOWING A MEAL 180 tablet 1  . Multiple Vitamins-Minerals (PRESERVISION AREDS)  CAPS Take 2 capsules by mouth every morning.      No current facility-administered medications for this visit.     Allergies:   Patient has no known allergies.    Social History:  The patient  reports that he quit smoking about 37 years ago. His smoking use included cigarettes. He has a 20.00 pack-year smoking history. he has never used smokeless tobacco. He reports that he drinks about 8.4 oz of alcohol per week. He reports that he does not use drugs.   Family History:  The patient's family history includes  Cancer in his daughter; Dementia in his mother; Diabetes in his daughter and mother; Heart disease in his mother and son; Heart disease (age of onset: 44) in his father; Hypertension in his mother; Stroke in his sister.    ROS:  Please see the history of present illness.   Otherwise, review of systems are positive for none.   All other systems are reviewed and negative.    PHYSICAL EXAM: VS:  BP (!) 188/98 (BP Location: Left Arm, Patient Position: Sitting, Cuff Size: Normal)   Pulse 79   Ht 5\' 11"  (1.803 m)   Wt 172 lb 12 oz (78.4 kg)   BMI 24.09 kg/m  , BMI Body mass index is 24.09 kg/m. GEN: Well nourished, well developed, in no acute distress  HEENT: normal  Neck: no JVD or masses. Faint bilateral carotid bruits Cardiac: Regular with premature beats; no rubs, or gallops,no edema . There is a 3/6 crescendo decrescendo murmur in the aortic area which is mid peaking with slightly diminished S2 Respiratory:  clear to auscultation bilaterally, normal work of breathing GI: soft, nontender, nondistended, + BS MS: no deformity or atrophy  Skin: warm and dry, no rash Neuro:  Strength and sensation are intact Psych: euthymic mood, full affect   EKG:  EKG is ordered today. The ekg ordered today demonstrates normal sinus rhythm with right bundle branch block   Recent Labs: 11/18/2016: TSH 1.17 05/27/2017: ALT 13; BUN 17; Creat 0.78; Hemoglobin 13.4; Platelets 292; Potassium 4.6; Sodium 142    Lipid Panel    Component Value Date/Time   CHOL 174 05/27/2017 1007   CHOL 184 10/01/2013 1719   TRIG 124 05/27/2017 1007   TRIG 400 (H) 10/01/2013 1719   HDL 75 05/27/2017 1007   HDL 55 10/01/2013 1719   CHOLHDL 2.3 05/27/2017 1007   VLDL 28 06/02/2016 0714   VLDL 80 (H) 10/01/2013 1719   LDLCALC 72 06/02/2016 0714   LDLCALC 49 10/01/2013 1719      Wt Readings from Last 3 Encounters:  07/26/17 172 lb 12 oz (78.4 kg)  05/27/17 168 lb 6.4 oz (76.4 kg)  11/25/16 172 lb 6.4 oz (78.2 kg)       Other studies Reviewed: Additional studies/ records that were reviewed today include: Recent office notes. Review of the above records demonstrates: Persistently elevated blood pressure   ASSESSMENT AND PLAN:  1.  Paroxysmal atrial fibrillation: The patient continues to be in sinus rhythm. He is tolerating anticoagulation with no reported side effects.  I reviewed his labs done in December which overall were unremarkable  2. Essential hypertension:   Blood pressure continues to be elevated with a component of whitecoat syndrome.  I increased lisinopril to 40 mg once daily.  I asked him to bring his blood pressure machine with him next time to make sure the readings correlate. Blood pressure remains elevated, I recommend adding amlodipine.  3.  Aortic  valve stenosis: This was still mild on most recent echocardiogram last year.  Repeat echocardiogram in 1 year  4. Hyperlipidemia: Continue treatment with lovastatin. Lipid profile from February was reviewed which showed an LDL of 78.  5. Left carotid stenosis: This is being followed by [name].    Disposition:   FU with me in 6 months  Signed,  Kathlyn Sacramento, MD  07/26/2017 4:19 PM    [place]

## 2017-08-23 DIAGNOSIS — L821 Other seborrheic keratosis: Secondary | ICD-10-CM | POA: Diagnosis not present

## 2017-08-23 DIAGNOSIS — X32XXXA Exposure to sunlight, initial encounter: Secondary | ICD-10-CM | POA: Diagnosis not present

## 2017-08-23 DIAGNOSIS — L300 Nummular dermatitis: Secondary | ICD-10-CM | POA: Diagnosis not present

## 2017-08-23 DIAGNOSIS — L570 Actinic keratosis: Secondary | ICD-10-CM | POA: Diagnosis not present

## 2017-08-23 DIAGNOSIS — B360 Pityriasis versicolor: Secondary | ICD-10-CM | POA: Diagnosis not present

## 2017-09-01 ENCOUNTER — Ambulatory Visit: Payer: Self-pay | Admitting: Family Medicine

## 2017-09-14 DIAGNOSIS — H2512 Age-related nuclear cataract, left eye: Secondary | ICD-10-CM | POA: Diagnosis not present

## 2017-09-14 DIAGNOSIS — H25812 Combined forms of age-related cataract, left eye: Secondary | ICD-10-CM | POA: Diagnosis not present

## 2017-09-15 DIAGNOSIS — H353122 Nonexudative age-related macular degeneration, left eye, intermediate dry stage: Secondary | ICD-10-CM | POA: Diagnosis not present

## 2017-09-15 DIAGNOSIS — H353211 Exudative age-related macular degeneration, right eye, with active choroidal neovascularization: Secondary | ICD-10-CM | POA: Diagnosis not present

## 2017-09-15 DIAGNOSIS — H43813 Vitreous degeneration, bilateral: Secondary | ICD-10-CM | POA: Diagnosis not present

## 2017-10-27 DIAGNOSIS — H353290 Exudative age-related macular degeneration, unspecified eye, stage unspecified: Secondary | ICD-10-CM | POA: Insufficient documentation

## 2017-10-27 DIAGNOSIS — H43813 Vitreous degeneration, bilateral: Secondary | ICD-10-CM | POA: Diagnosis not present

## 2017-10-27 DIAGNOSIS — H25812 Combined forms of age-related cataract, left eye: Secondary | ICD-10-CM | POA: Diagnosis not present

## 2017-10-27 DIAGNOSIS — H353124 Nonexudative age-related macular degeneration, left eye, advanced atrophic with subfoveal involvement: Secondary | ICD-10-CM | POA: Diagnosis not present

## 2017-10-27 DIAGNOSIS — H35362 Drusen (degenerative) of macula, left eye: Secondary | ICD-10-CM | POA: Diagnosis not present

## 2017-10-27 DIAGNOSIS — Z961 Presence of intraocular lens: Secondary | ICD-10-CM | POA: Diagnosis not present

## 2017-10-27 DIAGNOSIS — H353112 Nonexudative age-related macular degeneration, right eye, intermediate dry stage: Secondary | ICD-10-CM | POA: Diagnosis not present

## 2017-11-04 ENCOUNTER — Other Ambulatory Visit: Payer: Self-pay | Admitting: Family Medicine

## 2017-11-04 NOTE — Telephone Encounter (Signed)
Pt has an appt coming up end of June. Will refill until then

## 2017-11-20 NOTE — Progress Notes (Signed)
Chief Complaint  Patient presents with  . Diabetes    nonfasting med check. No concerns.    He reports chronic runny nose, sometimes related to certain foods he eats.  It bothers him when he eats out.  He has tried claritin in the past without much benefit.  Hypertension: Dr. Jearld Adjutant lisinoprildoseto 40 mgdaily at his last visit in February. He has a significant white coat component to his blood pressures. [name] mentioned possibly adding amlodipine if still elevated. BP's in June have been running 120-183/66-88.  Mostly 130's-150's/70's-80. He always sees lower values when he is active, but had to limit his activity related to cataract surgery in the Spring. He mainly has restriction with lifting, not with walking. He is also getting injections for his macular disease, which also has some restrictions afterwards.  He denies headaches, lightheadedness,chest pain, palpitations, shortness of breath, edema. Reports compliance with his medications. He doesn't have much energy, and can fall asleep easily in a chair. He does not have any symptoms today, with his elevated pressure. Wife reports he snores, and "makes crazy sounds at night".  Feels pretty refreshed in the mornings, but easily dozes off during the day. Eval with sleep study was recommended in the past and he declined.  Paroxysmal atrial fibrillation--Cardiologist recommends lifelong anticoagulation. Patient denies palpitations, tachycardia, denies any significant bleeding/bruising or side effects from medication. Last echo showed normal EF with stable mild aortic stenosis.[name] plans to repeat next year.  DM/IFG--He doesn't check his sugars. He goes to the eye doctor regularly, for macular degeneration, last visit in February.  He denies any feet concerns, numbness/tingling.He has urinary frequency x several years (related to prostate), no change; denies polydipsia. Drinking 1-2beers/day, sometimes none--doesn't seem  to taste as good (he previously drank more, having cut back to 2/d).  Adds less sugar to his coffee. "I do like sweets".    Hyperlipidemia: Compliant with medications; denies side effects. Tries to follow a lowfat, low cholesterol diet, cut back on fried foods. Lab Results  Component Value Date   CHOL 174 05/27/2017   HDL 75 05/27/2017   LDLCALC 78 05/27/2017   TRIG 124 05/27/2017   CHOLHDL 2.3 05/27/2017    H/o stroke in 10/2014(right frontal white matter infarct in June 2016 secondary to small vessel disease). He has asymptomatic L carotid artery stenosis, and is monitored by [name] for this.Repeat US was done 6/28//18, without significant change (40-59% L ICA, <39% R ICA), scheduled for recheck. He denies any neurologic symptoms.  Prostate cancer: Monitored by [name] every 6 months. He underwent internal urethrotomy, cystourethroscopy and circumcision on 02/15/17. He reports not really noticing improvement since treatment of the stricture, continues with frequency and leakage.  Last PSA was < 0.01 in January 2019.  PMH, PSH, SH reviewed  Outpatient Encounter Medications as of 11/21/2017  Medication Sig Note  . acetaminophen (TYLENOL ARTHRITIS PAIN) 650 MG CR tablet Take 1 tablet (650 mg total) by mouth every 8 (eight) hours as needed. 04/28/2015: 2 qam and 2 qpm  . ELIQUIS 5 MG TABS tablet TAKE ONE TABLET BY MOUTH TWICE DAILY   . lisinopril (PRINIVIL,ZESTRIL) 40 MG tablet Take 1 tablet (40 mg total) by mouth daily.   [name] lovastatin (MEVACOR) 40 MG tablet Take 2 tablets (80 mg total) by mouth at bedtime.   . metoprolol succinate (TOPROL-XL) 50 MG 24 hr tablet TAKE 1 TABLET BY MOUTH TWICE DAILY WITH OR IMMEDIATLEY FOLLOWING A MEAL   . Multiple Vitamins-Minerals (PRESERVISION  AREDS) CAPS Take 1 capsule by mouth 2 (two) times daily.    [name] UNABLE TO FIND Bevacizumab(AVASTIN) chemo injection for the eye every 6 weeks   . betamethasone dipropionate (DIPROLENE) 0.05 % ointment Apply  topically 2 (two) times daily.   . [DISCONTINUED] lovastatin (MEVACOR) 40 MG tablet TAKE TWO TABLETS AT BEDTIME    No facility-administered encounter medications on file as of 11/21/2017.     ROS: no fever, chills, URI symptoms, cough, shortness of breath, chest pain, edema bleeding, bruising.   Some heartburn/indigestion if he eats too late. Some knee pain intermittently, related to weather. Runny nose per HPI, with eating. No other problems. See HPI    PHYSICAL EXAM:  BP (!) 190/100   Pulse 64   Ht _0  (1.803 m)   Wt 172 lb 3.2 oz (78.1 kg)   BMI 24.02 kg/m   200/94 on repeat by MD  Wt Readings from Last 3 Encounters:  11/21/17 172 lb 3.2 oz (78.1 kg)  07/26/17 172 lb 12 oz (78.4 kg)  05/27/17 168 lb 6.4 oz (76.4 kg)    Well developed, pleasant male, in no distress, in good spirits. HEENT: PERRL, EOMI, conjunctiva and sclera are clear. Nasal mucosa mildly edematous, no erythema or drainage. OP clear, sinuses nontender Neck: no lymphadenopathy or mass. Bruits bilaterally--consistent with radiation of heart murmur into his carotids Heart: Harsh SEM, heard throughout; loudest at RUSB. regular rate and rhythm, no ectopy Lungs: clear bilaterally Back: no CVA or spinal tenderness Abdomen: soft, nontender, no organomegaly or mass. +abdominal obesity. Extremities: no edema, 2+ pulses Neuro: alert and oriented. Normal cranial nerves, gait, strength Psych: normal mood, affect, hygiene and grooming. Skin:  dry skin on legs. Some bruising on arms.  No lesions or rash   Lab Results  Component Value Date   HGBA1C 6.1 (A) 11/21/2017     ASSESSMENT/PLAN:  Type 2 diabetes mellitus with microalbuminuria, without long-term current use of insulin (HCC) - stable, diet controlled; encouraged daily exercise, losing weight at waist/abdomen - Plan: HgB A1c, Comprehensive metabolic panel  Hypertension associated with diabetes ([place]) - very high related to white coat. okay at home,  though when active he used to see much lower BP's. Encouraged daily exercise - Plan: Comprehensive metabolic panel  Hyperlipidemia associated with type 2 diabetes mellitus ([place]) - lipids okay per last check; continue statin, lowfat, low cholesterol diet - Plan: Comprehensive metabolic panel  Paroxysmal atrial fibrillation (HCC) - in sinus rhythm. Continue anticoagulants and current meds  Left carotid artery stenosis  Aortic atherosclerosis (HCC)  Long term current use of anticoagulant - Plan: CBC with Differential/Platelet  Medication monitoring encounter - Plan: Comprehensive metabolic panel, CBC with Differential/Platelet  Pure hypercholesterolemia - Plan: lovastatin (MEVACOR) 40 MG tablet  Chronic rhinitis - trial of atrovent nasal spray prior to eating (perhaps just when eating out, if that is what pt prefers) - Plan: ipratropium (ATROVENT) 0.06 % nasal spray  Daytime somnolence - Plan: Home sleep test  Snoring - Plan: Home sleep test   A1c c-met, CBC (he is nonfasting)  We discussed symptoms, diagnosis and risks of sleep apnea.  He was very hesitant, but I strongly encouraged that he set up sleep study (given the long-term risks of untreated OSA).  Will try to set up a home study, to pick up from [facility]. Pt is hesitant, but willing to listen to my recommendation (after trying to bargain, reporting he would exercise more instead).    AWV/med check 6 mos

## 2017-11-21 ENCOUNTER — Encounter: Payer: Self-pay | Admitting: Family Medicine

## 2017-11-21 ENCOUNTER — Ambulatory Visit (INDEPENDENT_AMBULATORY_CARE_PROVIDER_SITE_OTHER): Payer: Medicare Other | Admitting: Family Medicine

## 2017-11-21 VITALS — BP 190/100 | HR 64 | Ht 71.0 in | Wt 172.2 lb

## 2017-11-21 DIAGNOSIS — E785 Hyperlipidemia, unspecified: Secondary | ICD-10-CM | POA: Diagnosis not present

## 2017-11-21 DIAGNOSIS — E1159 Type 2 diabetes mellitus with other circulatory complications: Secondary | ICD-10-CM | POA: Diagnosis not present

## 2017-11-21 DIAGNOSIS — E7800 Pure hypercholesterolemia, unspecified: Secondary | ICD-10-CM

## 2017-11-21 DIAGNOSIS — I6522 Occlusion and stenosis of left carotid artery: Secondary | ICD-10-CM | POA: Diagnosis not present

## 2017-11-21 DIAGNOSIS — Z5181 Encounter for therapeutic drug level monitoring: Secondary | ICD-10-CM

## 2017-11-21 DIAGNOSIS — E1129 Type 2 diabetes mellitus with other diabetic kidney complication: Secondary | ICD-10-CM | POA: Diagnosis not present

## 2017-11-21 DIAGNOSIS — R0683 Snoring: Secondary | ICD-10-CM

## 2017-11-21 DIAGNOSIS — E1169 Type 2 diabetes mellitus with other specified complication: Secondary | ICD-10-CM

## 2017-11-21 DIAGNOSIS — I480 Paroxysmal atrial fibrillation: Secondary | ICD-10-CM | POA: Diagnosis not present

## 2017-11-21 DIAGNOSIS — I700 Atherosclerosis of aorta: Secondary | ICD-10-CM | POA: Diagnosis not present

## 2017-11-21 DIAGNOSIS — I10 Essential (primary) hypertension: Secondary | ICD-10-CM

## 2017-11-21 DIAGNOSIS — J310 Chronic rhinitis: Secondary | ICD-10-CM

## 2017-11-21 DIAGNOSIS — R809 Proteinuria, unspecified: Secondary | ICD-10-CM

## 2017-11-21 DIAGNOSIS — Z7901 Long term (current) use of anticoagulants: Secondary | ICD-10-CM

## 2017-11-21 DIAGNOSIS — R400 Somnolence: Secondary | ICD-10-CM

## 2017-11-21 LAB — COMPREHENSIVE METABOLIC PANEL
A/G RATIO: 1.7 (ref 1.2–2.2)
ALT: 15 IU/L (ref 0–44)
AST: 18 IU/L (ref 0–40)
Albumin: 4.6 g/dL (ref 3.5–4.7)
Alkaline Phosphatase: 40 IU/L (ref 39–117)
BUN/Creatinine Ratio: 18 (ref 10–24)
BUN: 15 mg/dL (ref 8–27)
Bilirubin Total: 0.5 mg/dL (ref 0.0–1.2)
CALCIUM: 9.8 mg/dL (ref 8.6–10.2)
CO2: 26 mmol/L (ref 20–29)
CREATININE: 0.85 mg/dL (ref 0.76–1.27)
Chloride: 101 mmol/L (ref 96–106)
GFR calc Af Amer: 93 mL/min/{1.73_m2} (ref >59)
GFR, EST NON AFRICAN AMERICAN: 81 mL/min/{1.73_m2} (ref >59)
Globulin, Total: 2.7 g/dL (ref 1.5–4.5)
Glucose: 102 mg/dL — ABNORMAL HIGH (ref 65–99)
Potassium: 4.8 mmol/L (ref 3.5–5.2)
Sodium: 142 mmol/L (ref 134–144)
TOTAL PROTEIN: 7.3 g/dL (ref 6.0–8.5)

## 2017-11-21 LAB — CBC WITH DIFFERENTIAL/PLATELET
BASOS: 0 %
Basophils Absolute: 0 10*3/uL (ref 0.0–0.2)
EOS (ABSOLUTE): 0.1 10*3/uL (ref 0.0–0.4)
EOS: 1 %
HEMOGLOBIN: 14 g/dL (ref 13.0–17.7)
Hematocrit: 43 % (ref 37.5–51.0)
IMMATURE GRANS (ABS): 0 10*3/uL (ref 0.0–0.1)
IMMATURE GRANULOCYTES: 0 %
LYMPHS: 23 %
Lymphocytes Absolute: 1.8 10*3/uL (ref 0.7–3.1)
MCH: 32.3 pg (ref 26.6–33.0)
MCHC: 32.6 g/dL (ref 31.5–35.7)
MCV: 99 fL — ABNORMAL HIGH (ref 79–97)
MONOCYTES: 13 %
Monocytes Absolute: 1 10*3/uL — ABNORMAL HIGH (ref 0.1–0.9)
NEUTROS PCT: 63 %
Neutrophils Absolute: 4.8 10*3/uL (ref 1.4–7.0)
Platelets: 315 10*3/uL (ref 150–450)
RBC: 4.33 x10E6/uL (ref 4.14–5.80)
RDW: 13.7 % (ref 12.3–15.4)
WBC: 7.8 10*3/uL (ref 3.4–10.8)

## 2017-11-21 LAB — POCT GLYCOSYLATED HEMOGLOBIN (HGB A1C): Hemoglobin A1C: 6.1 % — AB (ref 4.0–5.6)

## 2017-11-21 MED ORDER — LOVASTATIN 40 MG PO TABS: 80.0000 mg | ORAL_TABLET | Freq: Every day | ORAL | 1 refills | Status: DC

## 2017-11-21 MED ORDER — IPRATROPIUM BROMIDE 0.06 % NA SOLN: 2.0000 | Freq: Three times a day (TID) | NASAL | 5 refills | Status: DC

## 2017-11-21 NOTE — Patient Instructions (Signed)
Use the nasal spray prior to eating out to see if this helps with your runny nose. Continue your other medications. Resume regular exercise (ie walking, at least 30 minutes daily 5x per week, can be in 10-15 minute intervals).  Exercise in the past really has brought your blood pressure down. Continue to monitor and bring your machine to your next visit with the cardiologist.  We will be referring you for a sleep study.  I'm concerned about possible sleep apnea contributing to your daytime sleepiness and elevated blood pressures.

## 2017-12-08 ENCOUNTER — Encounter (HOSPITAL_COMMUNITY): Payer: Medicare Other

## 2017-12-08 ENCOUNTER — Ambulatory Visit: Payer: Medicare Other | Admitting: Family

## 2017-12-08 DIAGNOSIS — H35721 Serous detachment of retinal pigment epithelium, right eye: Secondary | ICD-10-CM | POA: Diagnosis not present

## 2017-12-08 DIAGNOSIS — H35363 Drusen (degenerative) of macula, bilateral: Secondary | ICD-10-CM | POA: Diagnosis not present

## 2017-12-08 DIAGNOSIS — H353124 Nonexudative age-related macular degeneration, left eye, advanced atrophic with subfoveal involvement: Secondary | ICD-10-CM | POA: Diagnosis not present

## 2017-12-08 DIAGNOSIS — H353211 Exudative age-related macular degeneration, right eye, with active choroidal neovascularization: Secondary | ICD-10-CM | POA: Diagnosis not present

## 2017-12-22 ENCOUNTER — Encounter (HOSPITAL_COMMUNITY): Payer: Medicare Other

## 2017-12-22 ENCOUNTER — Ambulatory Visit: Payer: Medicare Other | Admitting: Family

## 2017-12-23 ENCOUNTER — Ambulatory Visit (INDEPENDENT_AMBULATORY_CARE_PROVIDER_SITE_OTHER): Payer: Medicare Other | Admitting: Family

## 2017-12-23 ENCOUNTER — Encounter: Payer: Self-pay | Admitting: Family

## 2017-12-23 ENCOUNTER — Other Ambulatory Visit: Payer: Self-pay

## 2017-12-23 ENCOUNTER — Ambulatory Visit (HOSPITAL_COMMUNITY)
Admission: RE | Admit: 2017-12-23 | Discharge: 2017-12-23 | Disposition: A | Discharge status: Home / Self Care | Payer: Medicare Other | Source: Ambulatory Visit | Attending: Family | Admitting: Family

## 2017-12-23 VITALS — BP 202/85 | HR 63 | Temp 97.2°F | Resp 16 | Ht 71.0 in | Wt 171.4 lb

## 2017-12-23 DIAGNOSIS — I6523 Occlusion and stenosis of bilateral carotid arteries: Secondary | ICD-10-CM | POA: Diagnosis not present

## 2017-12-23 DIAGNOSIS — I6522 Occlusion and stenosis of left carotid artery: Secondary | ICD-10-CM | POA: Diagnosis not present

## 2017-12-23 NOTE — Patient Instructions (Signed)

## 2017-12-23 NOTE — Progress Notes (Signed)
Chief Complaint: Follow up Extracranial Carotid Artery Stenosis   History of Present Illness  Jeffrey [name] is a 84 y.o. male whom [name] has been monitoring for asymptomatic carotid stenosis. Pt continues to deny any symptoms of TIA amaurosis or stroke. Specifically he deniesa history of amaurosis fugax or monocular blindness, unilateral facial drooping, hemiplegia, orreceptive or expressive aphasia.  He was quite ill early in 2017 in the intensive care unit, has recovered significantly.  He is on Eliquis for atrial fibrillation. His primary care physician is Aris [place]. Chronic medical problems include hypertension elevated cholesterol and diabetes atrial fibrillation all of which are currently stable.  Patient has not had previous carotid artery intervention.  [name] last evaluated pt on 11-06-15. At that time carotid duplex showed 40-60% left internal carotid artery stenosis with essentially no change from August 2016. However, velocities were low enough that he was downgraded by category. Right side less than 40% stenosis. Asymptomatic moderate left internal carotid artery stenosis probably on the level of 50-60% [name] advised pt to follow-up in 1 year with repeat carotid duplex scan or sooner if develops symptoms of TIA amaurosis or stroke.  He states his blood pressure at home runs 130-150's/70-80.  Pt states his blood pressure varies so much for years, that he keeps a log of his blood pressures for his PCP, states his blood pressure goes up in a doctor office.   Diabetic: yes, last A1C result on file was 6.1 on 11-21-17, well controlled Tobacco use: former smoker, quit in 1982  Pt meds include: Statin : yes ASA: no Other anticoagulants/antiplatelets: Eliquis for atrial fib   Past Medical History:  Diagnosis Date  . Acute CVA (cerebrovascular accident) ([place])   . Acute encephalopathy 11/27/2014  . Acute on chronic combined systolic and diastolic CHF  (congestive heart failure) ([place])   . Aortic atherosclerosis ([place]) 11/25/2014  . Atrial fibrillation ([place])   . Diabetes mellitus   . Heart murmur    mild aortic stenosis  . Hypercholesteremia   . Hypertension   . Impaired fasting glucose   . Macular degeneration   . Multiple lacunar infarcts ([place])   . Prostate cancer ([place]) radiation + seeding implant (Dr.[name])  . 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 Hilarie Fredrickson)  . RMSF [facility] spotted fever) 10/2014   (hosp with FUO, encephalopathy)  . Rotator cuff tear, right 10/2010   supraspinatous and infraspinatous  . TIA (transient ischemic attack) 10/02/13   hosp at [facility] x 1 night  . 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 Smoking status: Former Smoker    Packs/day: 1.00    Years: 20.00    Pack years: 20.00    Types: Cigarettes    Last attempt to quit: 05/31/1980    Years since quitting: 37.5  . Smokeless tobacco: Never Used  Substance Use Topics  . Alcohol use: Yes    Alcohol/week: 8.4 oz    Types: 14 Cans of beer per week    Comment: 2 cans of beer most days of the week  . Drug use: No    Family History Family History  Problem Relation Age of Onset  . Heart disease Father 49       Died suddenly.  No diagnosis  . Stroke Sister   . Dementia Mother   . Diabetes Mother   . Heart disease  Mother   . Hypertension Mother   . Cancer Daughter        ovarian (?) vs other male cancer; s/p hyst doing well  . Diabetes Daughter        GDM, and now AODM  . Heart disease Son        congestive heart failure--improved    Surgical History Past Surgical History:  Procedure Laterality Date  . CATARACT EXTRACTION Right 01/2017  . CIRCUMCISION  01/2017   [name]  . COLONOSCOPY N/A 11/28/2012   Procedure: COLONOSCOPY;  Surgeon: Jerene Bears, MD;  Location[facility];  Service: Gastroenterology;   Laterality: N/A;  . INTERNAL URETHROTOMY  01/27/2016   WF  . prostate seed implant    . ROTATOR CUFF REPAIR  06/16/2011   right (Dr. Berenice Primas)  . TONSILLECTOMY AND ADENOIDECTOMY  age 89    No Known Allergies  Current Outpatient Medications  Medication Sig Dispense Refill  . acetaminophen (TYLENOL ARTHRITIS PAIN) 650 MG CR tablet Take 1 tablet (650 mg total) by mouth every 8 (eight) hours as needed.    . betamethasone dipropionate (DIPROLENE) 0.05 % ointment Apply topically 2 (two) times daily.    [name] ELIQUIS 5 MG TABS tablet TAKE ONE TABLET BY MOUTH TWICE DAILY 180 tablet 3  . ipratropium (ATROVENT) 0.06 % nasal spray Place 2 sprays into both nostrils 3 (three) times daily. 15 mL 5  . lisinopril (PRINIVIL,ZESTRIL) 40 MG tablet Take 1 tablet (40 mg total) by mouth daily. 90 tablet 3  . lovastatin (MEVACOR) 40 MG tablet Take 2 tablets (80 mg total) by mouth at bedtime. 180 tablet 1  . metoprolol succinate (TOPROL-XL) 50 MG 24 hr tablet TAKE 1 TABLET BY MOUTH TWICE DAILY WITH OR IMMEDIATLEY FOLLOWING A MEAL 180 tablet 1  . Multiple Vitamins-Minerals (PRESERVISION AREDS) CAPS Take 1 capsule by mouth 2 (two) times daily.     [name] UNABLE TO FIND Bevacizumab(AVASTIN) chemo injection for the eye every 6 weeks     No current facility-administered medications for this visit.     Review of Systems : See HPI for pertinent positives and negatives.  Physical Examination  Vitals:   12/23/17 1134 12/23/17 1135  BP: (!) 201/91 (!) 202/85  Pulse: 63   Resp: 16   Temp: (!) 97.2 F (36.2 C)   TempSrc: Oral   SpO2: 97%   Weight: 171 lb 6.4 oz (77.7 kg)   Height: 5\' 11"  (1.803 m)    Body mass index is 23.91 kg/m.  General: WDWN male in NAD GAIT: normal HENT: unremarkable Eyes: PERRLA Pulmonary:  Respirations are non-labored, good air movement, CTAB, no rales,  rhonchi, or wheezing. Cardiac: Irregular rhythm with controlled rate, +murmur.  VASCULAR EXAM Carotid Bruits Right Left   Transmitted  cardiac murmur transmitted cardiac murmur     Abdominal aortic pulse is not palpable. Radial pulses are 2+ palpable and equal.  LE Pulses Right Left       POPLITEAL  not palpable   not palpable       POSTERIOR TIBIAL  1+ palpable   1+ palpable        DORSALIS PEDIS      ANTERIOR TIBIAL not palpable  not palpable     Gastrointestinal: soft, nontender, BS WNL, no r/g, no palpable masses. Musculoskeletal: No muscle atrophy/wasting. M/S 5/5 throughout, extremities without ischemic changes.  Skin: No rashes, no ulcers, no cellulitis. Dry flaky skin of lower legs.  Neurologic:  A&O X 3; appropriate affect, sensation is normal; speech is normal, CN 2-12 intact except is hard of hearing, pain and light touch intact in extremities, motor exam as listed above. Resting fine tremor of head and hands.  Psychiatric: Normal thought content, mood appropriate to clinical situation.    Assessment: Jeffrey [name] is a 84 y.o. male who who has no history of stroke or TIA.   He states his blood pressure at home runs 130-150's/70-80 and increases in a medical setting.   His atherosclerotic risk factors include well controlled DM, remote history of smoking, CHF, and hypertension.  He has atrial fib, takes Eliquis. He also takes a statin.    DATA Carotid Duplex (12-23-17): Right ICA: 1-39% stenosis Left ICA: 40-59% stenosis Bilateral vertebral artery flow is antegrade.  Bilateral subclavian artery waveforms are normal.  No significant change compared to the exams on 11-06-15 and 11-25-16   Plan:  Follow-up in 1 year with Carotid Duplex scan.   I discussed in depth with the patient the nature of atherosclerosis, and emphasized the importance of maximal medical management including strict control of blood pressure, blood glucose, and lipid levels,  obtaining regular exercise, and continued cessation of smoking.  The patient is aware that without maximal medical management the underlying atherosclerotic disease process will progress, limiting the benefit of any interventions. The patient was given information about stroke prevention and what symptoms should prompt the patient to seek immediate medical care. Thank you for allowing [place] to participate in this patient's care.  Clemon Chambers, RN, MSN, FNP-C [facility] Office: [phone]  Clinic Physician: Donzetta Matters  12/23/17 11:50 AM

## 2018-01-12 DIAGNOSIS — H353123 Nonexudative age-related macular degeneration, left eye, advanced atrophic without subfoveal involvement: Secondary | ICD-10-CM | POA: Diagnosis not present

## 2018-01-12 DIAGNOSIS — H35363 Drusen (degenerative) of macula, bilateral: Secondary | ICD-10-CM | POA: Diagnosis not present

## 2018-01-12 DIAGNOSIS — H353211 Exudative age-related macular degeneration, right eye, with active choroidal neovascularization: Secondary | ICD-10-CM | POA: Diagnosis not present

## 2018-01-26 ENCOUNTER — Ambulatory Visit (INDEPENDENT_AMBULATORY_CARE_PROVIDER_SITE_OTHER): Payer: Medicare Other | Admitting: Cardiovascular Disease

## 2018-01-26 ENCOUNTER — Encounter: Payer: Self-pay | Admitting: Cardiovascular Disease

## 2018-01-26 VITALS — BP 216/108 | HR 73 | Ht 71.0 in | Wt 168.8 lb

## 2018-01-26 DIAGNOSIS — I6522 Occlusion and stenosis of left carotid artery: Secondary | ICD-10-CM | POA: Diagnosis not present

## 2018-01-26 DIAGNOSIS — I10 Essential (primary) hypertension: Secondary | ICD-10-CM

## 2018-01-26 DIAGNOSIS — E785 Hyperlipidemia, unspecified: Secondary | ICD-10-CM

## 2018-01-26 DIAGNOSIS — I350 Nonrheumatic aortic (valve) stenosis: Secondary | ICD-10-CM

## 2018-01-26 DIAGNOSIS — I480 Paroxysmal atrial fibrillation: Secondary | ICD-10-CM

## 2018-01-26 MED ORDER — CARVEDILOL 12.5 MG PO TABS: 12.5000 mg | ORAL_TABLET | Freq: Two times a day (BID) | ORAL | 3 refills | Status: DC

## 2018-01-26 MED ORDER — AMLODIPINE BESYLATE 5 MG PO TABS: 5.0000 mg | ORAL_TABLET | Freq: Every day | ORAL | 3 refills | Status: DC

## 2018-01-26 NOTE — Patient Instructions (Signed)
Medication Instructions: STOP the Metoprolol (Toprol). START Carvedilol 12.5 mg twice daily START Amlodipine 5 mg daily  If you need a refill on your cardiac medications before your next appointment, please call your pharmacy.   Follow-Up: Your physician wants you to follow-up in 2 months with an APP.   Thank you for choosing [facility]!

## 2018-01-26 NOTE — Progress Notes (Signed)
Cardiology Office Note   Date:  01/26/2018   ID:  Jeffrey, [name] 1932-06-17, MRN [id number]  PCP:  Jeffrey Ohara, MD  Cardiologist:   Jeffrey Sacramento, MD   Chief Complaint  Patient presents with  . other    6 month follow up. Meds reviewed by the pt. verbally. "doing well."       History of Present Illness: Jeffrey [name] is a 84 y.o. male who presents for a follow-up visit regarding paroxysmal atrial fibrillation.  He has known history of hypertension, hyperlipidemia, TIA, mild aortic stenosis, moderate left carotid stenosis and borderline diabetes mellitus. He had previous stroke in June 2016 and later was found to have [facility] Fever which was treated with doxycycline.   He underwent a nuclear stress test in August of 2016 which showed no evidence of ischemia with normal ejection fraction. Most recent echocardiogram in February 2018 showed normal LV systolic function, mild aortic stenosis with a mean gradient of 15 mmHg and no significant pulmonary hypertension.  His blood pressure has been running extremely high around 518 systolic.  I reviewed recent office visits with other physicians and his systolic blood pressure is consistently above 180.  In spite of that, he denies any chest pain, shortness of breath or palpitations.  No dizziness or headache.  He takes his medications regularly.  Past Medical History:  Diagnosis Date  . Acute CVA (cerebrovascular accident) ([place])   . Acute encephalopathy 11/27/2014  . Acute on chronic combined systolic and diastolic CHF (congestive heart failure) ([place])   . Aortic atherosclerosis ([place]) 11/25/2014  . Atrial fibrillation ([place])   . Diabetes mellitus   . Heart murmur    mild aortic stenosis  . Hypercholesteremia   . Hypertension   . Impaired fasting glucose   . Macular degeneration   . Multiple lacunar infarcts ([place])   . Prostate cancer ([place]) radiation + seeding implant (Dr.[name])  . Radiation proctitis 11/2012   treated  with APC ablation and Canasa suppositories (Dr. Hilarie Fredrickson)  . 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 Rotator cuff tear, right 10/2010   supraspinatous and infraspinatous  . TIA (transient ischemic attack) 10/02/13   hosp at [facility] x 1 night  . 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 CATARACT EXTRACTION Right 01/2017  . CIRCUMCISION  01/2017   [name]  . COLONOSCOPY N/A 11/28/2012   Procedure: COLONOSCOPY;  Surgeon: Jerene Bears, MD;  Location: [facility];  Service: Gastroenterology;  Laterality: N/A;  . INTERNAL URETHROTOMY  01/27/2016   WF  . prostate seed implant    . ROTATOR CUFF REPAIR  06/16/2011   right (Dr. Berenice Primas)  . TONSILLECTOMY AND ADENOIDECTOMY  age 79     Current Outpatient Medications  Medication Sig Dispense Refill  . acetaminophen (TYLENOL ARTHRITIS PAIN) 650 MG CR tablet Take 1 tablet (650 mg total) by mouth every 8 (eight) hours as needed.    . betamethasone dipropionate (DIPROLENE) 0.05 % ointment Apply topically 2 (two) times daily.    [name] ELIQUIS 5 MG TABS tablet TAKE ONE TABLET BY MOUTH TWICE DAILY 180 tablet 3  . ipratropium (ATROVENT) 0.06 % nasal spray Place 2 sprays into both nostrils 3 (three) times daily. 15 mL 5  . lisinopril (PRINIVIL,ZESTRIL) 40 MG tablet Take 1 tablet (40 mg total) by mouth daily. 90 tablet 3  .  lovastatin (MEVACOR) 40 MG tablet Take 2 tablets (80 mg total) by mouth at bedtime. 180 tablet 1  . metoprolol succinate (TOPROL-XL) 50 MG 24 hr tablet TAKE 1 TABLET BY MOUTH TWICE DAILY WITH OR IMMEDIATLEY FOLLOWING A MEAL 180 tablet 1  . Multiple Vitamins-Minerals (PRESERVISION AREDS) CAPS Take 1 capsule by mouth 2 (two) times daily.     [name] UNABLE TO FIND Bevacizumab(AVASTIN) chemo injection for the eye every 6 weeks     No current facility-administered medications for this visit.     Allergies:   Patient has no  known allergies.    Social History:  The patient  reports that he quit smoking about 37 years ago. His smoking use included cigarettes. He has a 20.00 pack-year smoking history. He has never used smokeless tobacco. He reports that he drinks about 14.0 standard drinks of alcohol per week. He reports that he does not use drugs.   Family History:  The patient's family history includes Cancer in his daughter; Dementia in his mother; Diabetes in his daughter and mother; Heart disease in his mother and son; Heart disease (age of onset: 9) in his father; Hypertension in his mother; Stroke in his sister.    ROS:  Please see the history of present illness.   Otherwise, review of systems are positive for none.   All other systems are reviewed and negative.    PHYSICAL EXAM: VS:  BP (!) 216/108 (BP Location: Left Arm, Patient Position: Sitting, Cuff Size: Normal)   Pulse 73   Ht 5\' 11"  (1.803 m)   Wt 168 lb 12 oz (76.5 kg)   BMI 23.54 kg/m  , BMI Body mass index is 23.54 kg/m. GEN: Well nourished, well developed, in no acute distress  HEENT: normal  Neck: no JVD or masses. Faint bilateral carotid bruits Cardiac: Regular with premature beats; no rubs, or gallops,no edema . There is a 2/6 crescendo decrescendo murmur in the aortic area which is mid peaking with slightly diminished S2 Respiratory:  clear to auscultation bilaterally, normal work of breathing GI: soft, nontender, nondistended, + BS MS: no deformity or atrophy  Skin: warm and dry, no rash Neuro:  Strength and sensation are intact Psych: euthymic mood, full affect   EKG:  EKG is ordered today. The ekg ordered today demonstrates normal sinus rhythm with right bundle branch block   Recent Labs: 11/21/2017: ALT 15; BUN 15; Creatinine, Ser 0.85; Hemoglobin 14.0; Platelets 315; Potassium 4.8; Sodium 142    Lipid Panel    Component Value Date/Time   CHOL 174 05/27/2017 1007   CHOL 184 10/01/2013 1719   TRIG 124 05/27/2017 1007    TRIG 400 (H) 10/01/2013 1719   HDL 75 05/27/2017 1007   HDL 55 10/01/2013 1719   CHOLHDL 2.3 05/27/2017 1007   VLDL 28 06/02/2016 0714   VLDL 80 (H) 10/01/2013 1719   LDLCALC 78 05/27/2017 1007   LDLCALC 49 10/01/2013 1719      Wt Readings from Last 3 Encounters:  01/26/18 168 lb 12 oz (76.5 kg)  12/23/17 171 lb 6.4 oz (77.7 kg)  11/21/17 172 lb 3.2 oz (78.1 kg)      Other studies Reviewed: Additional studies/ records that were reviewed today include: Recent office notes. Review of the above records demonstrates: Persistently elevated blood pressure   ASSESSMENT AND PLAN:  1.  Paroxysmal atrial fibrillation: The patient continues to be in sinus rhythm. He is tolerating anticoagulation with no reported side effects.  I reviewed his  labs done in June which overall were unremarkable  2. Essential hypertension:   Blood pressure continues to be elevated with a component of whitecoat syndrome.  Lisinopril was increased during last visit but his blood pressure continues to be very elevated.  I elected to switch metoprolol to carvedilol 12.5 mg twice daily and I added amlodipine 5 mg once daily.  If blood pressure continues to be elevated in spite of 3 different blood pressure medications, I recommend renal artery duplex to rule out renal artery stenosis considering his age.  3.  Aortic valve stenosis: This was still mild on most recent echocardiogram last year.  Repeat echocardiogram in later this year.  4. Hyperlipidemia: Continue treatment with lovastatin. Lipid profile from February was reviewed which showed an LDL of 78.  5. Left carotid stenosis: This is being followed by [name].    Disposition:   FU with APP in 2  months  Signed,  Jeffrey Sacramento, MD  01/26/2018 8:05 AM    [place]

## 2018-01-27 ENCOUNTER — Telehealth: Payer: Self-pay | Admitting: Cardiovascular Disease

## 2018-01-27 DIAGNOSIS — R55 Syncope and collapse: Secondary | ICD-10-CM | POA: Diagnosis not present

## 2018-01-27 NOTE — Telephone Encounter (Signed)
I agree. I think his BP is coming down with the medications and he might feel dizzy initially.

## 2018-01-27 NOTE — Telephone Encounter (Signed)
Spoke with pt's wife and b/p was 140/82 and heart rate was 88 felt dizzy and lightheaded while shopping EMS was called and EKG was fine but was told to call Cardiology Pt thinks it is from new meds Pt was just started on Amlodipine 5 mg and Carvedilol 12.5 mg bid yesterday .Encouraged to continue to meds and if has more Symptoms to call asked pt to take Lisinopril 40 mg in am and Carvedilol 12.5 mg am and 12 hours later and take Amlodipine 5 mg mid day or with evening dose of Carvedilol.Will forward to Dr Fletcher Anon for review .Adonis Housekeeper

## 2018-01-27 NOTE — Telephone Encounter (Signed)
Patient wife calling   Pt c/o medication issue:  1. Name of Medication:  amLODipine (NORVA [place]) 5 MG Carvedilol (COREG) 12.5 MG  2. How are you currently taking this medication (dosage and times per day)?  amLODipine 1 tablet daily, carvedilol 1 tablet two times daily   3. Are you having a reaction (difficulty breathing--STAT)? Patient was at [employer] and got real hot and dizzy - had to take a seat, no other symptoms  4. What is your medication issue? Patient was at [employer] and he had a spell where he got too hot and dizzy and EMT was called. EKG was normal but was told to contact cardiologist.  Would like to know if the new medications could have been the cause.  Please call to discuss

## 2018-01-31 NOTE — Telephone Encounter (Signed)
Returned the call to the patient to check on him. He stated that he was feeling better. He still had some dizziness but it was getting better. Blood pressure this morning two hours after his medication was 133/68. He will call back if anything further is needed.

## 2018-02-07 ENCOUNTER — Telehealth: Payer: Self-pay | Admitting: Family Medicine

## 2018-02-07 NOTE — Telephone Encounter (Signed)
Sounds like they might be treating her for diverticulitis, which shouldn't be contagious.  Since we know she was C.diff negative, he can use imodium as needed for diarrhea, stay well hydrated, avoid dairy, stick with a bland diet (BRAT--bananas, rice, applesauce, toast).  He will need to be evaluated if fever, bloody diarrhea, abdominal pain, or persistent/worsening symptoms. Start with these measures.

## 2018-02-07 NOTE — Telephone Encounter (Signed)
Pt's daughter, Garnette Czech, called stating that pt's wife had a case of bad diarrhea that put her in the hospital. Her diarrhea eventually became bloody. She was C diff negative. Wife was put on Flagyl & Cipro and being release home soon. Pt has started having diarrhea. He is a little afraid to leave home to come in for an appointment. Can Dr. Tomi Bamberger give him send meds to his pharmacy at [facility] in [place]? Does he possibly need Flagyl & Cipro like his wife? Call daughter back at [phone]

## 2018-02-07 NOTE — Telephone Encounter (Signed)
Pt was notified and will follow-up if more symptoms occur or not getting better

## 2018-02-16 DIAGNOSIS — H353123 Nonexudative age-related macular degeneration, left eye, advanced atrophic without subfoveal involvement: Secondary | ICD-10-CM | POA: Diagnosis not present

## 2018-02-16 DIAGNOSIS — H353211 Exudative age-related macular degeneration, right eye, with active choroidal neovascularization: Secondary | ICD-10-CM | POA: Diagnosis not present

## 2018-02-17 DIAGNOSIS — C61 Malignant neoplasm of prostate: Secondary | ICD-10-CM | POA: Diagnosis not present

## 2018-02-17 DIAGNOSIS — K627 Radiation proctitis: Secondary | ICD-10-CM | POA: Diagnosis not present

## 2018-02-17 DIAGNOSIS — N35014 Post-traumatic urethral stricture, male, unspecified: Secondary | ICD-10-CM | POA: Diagnosis not present

## 2018-02-22 DIAGNOSIS — Z23 Encounter for immunization: Secondary | ICD-10-CM | POA: Diagnosis not present

## 2018-03-01 ENCOUNTER — Encounter: Payer: Self-pay | Admitting: *Deleted

## 2018-03-16 ENCOUNTER — Other Ambulatory Visit: Payer: Self-pay | Admitting: Family Medicine

## 2018-03-16 DIAGNOSIS — E7800 Pure hypercholesterolemia, unspecified: Secondary | ICD-10-CM

## 2018-03-26 NOTE — Progress Notes (Signed)
Cardiology Office Note Date:  03/28/2018  Patient ID:  Jeffrey [name], Jeffrey [name] Oct 12, 1932, MRN [id number] PCP:  Rita Ohara, MD  Cardiologist:  [name], MD    Chief Complaint: Follow up  History of Present Illness: Jeffrey [name] is a 84 y.o. male with history of PAF on Eliquis, TIA/stroke, HTN, HLD, mild aortic stenosis, moderate left carotid artery stenosis and borderline diabetes who presents for follow-up of hypertension.  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 He underwent nuclear stress testing in 12/2014 that showed no evidence of significant ischemia with a normal EF. Echo in 07/2016 showed normal LVSF, mild aortic stenosis with a mean gradient of 15 mmHg, and no significant pulmonary hypertension. Patient was last seen in the office on 01/26/2018 noting his BP had recently been elevated into the 299M systolic with readings consistently greater than 426 systolic. His BP at his visit on 8/29 was noted to be 216/108. He was changed from metoprolol to Coreg 12.5 mg bid and was started on amlodipine 5 mg daily. He was continued on lisinopril 40 mg, which had recently been increased by PCP.   Patient comes in accompanied by his wife today and he is doing reasonably well.  He reports since transitioning from metoprolol to carvedilol and with the addition of amlodipine his blood pressures are quite low around noon with systolic readings frequently in the 80s to 100s.  With this, he is dizzy and lethargic.  His morning and evening blood pressure readings are typically in the 834H to 962I systolic.  He reports he gets quite nervous anytime he goes to a medical office leading to elevations in his blood pressure.  This has been long-standing and present for at least the past 10 years he indicates.  He indicates he is taking carvedilol 12.5 mg in the morning at 7 AM and no other antihypertensive at this time.  In the evenings around 7 PM he is taking  carvedilol 12.5 as well as lisinopril 40 and amlodipine 5.  He denies any chest pain, presyncope, or syncope.  No lower extremity swelling, orthopnea, abdominal distention, or early satiety.  Past Medical History:  Diagnosis Date  . Acute CVA (cerebrovascular accident) ([place])   . Acute encephalopathy 11/27/2014  . Acute on chronic combined systolic and diastolic CHF (congestive heart failure) ([place])   . Aortic atherosclerosis ([place]) 11/25/2014  . Atrial fibrillation ([place])   . Diabetes mellitus   . Heart murmur    mild aortic stenosis  . Hypercholesteremia   . Hypertension   . Impaired fasting glucose   . Macular degeneration   . Multiple lacunar infarcts ([place])   . Prostate cancer ([place]) radiation + seeding implant (Dr.[name])  . 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 Hilarie Fredrickson)  . 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 Rotator cuff tear, right 10/2010   supraspinatous and infraspinatous  . TIA (transient ischemic attack) 10/02/13   hosp at [facility] x 1 night  . 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 CATARACT EXTRACTION Right 01/2017  . CIRCUMCISION  01/2017   [name]  . COLONOSCOPY N/A 11/28/2012   Procedure: COLONOSCOPY;  Surgeon: Jerene Bears, MD;  Location: [facility];  Service: Gastroenterology;  Laterality: N/A;  . INTERNAL URETHROTOMY  01/27/2016  WF  . prostate seed implant    . ROTATOR CUFF REPAIR  06/16/2011   right (Dr. Berenice Primas)  . TONSILLECTOMY AND ADENOIDECTOMY  age 70    Current Meds  Medication Sig  . acetaminophen (TYLENOL ARTHRITIS PAIN) 650 MG CR tablet Take 1 tablet (650 mg total) by mouth every 8 (eight) hours as needed.  [name] amLODipine (NORVASC) 5 MG tablet Take 1 tablet (5 mg total) by mouth daily.  . betamethasone dipropionate (DIPROLENE) 0.05 % ointment Apply topically 2 (two) times  daily.  . carvedilol (COREG) 6.25 MG tablet Take 6.25 mg, one tablet, in the morning and 2 tablets (12.5 mg) in the evening.  [name] ELIQUIS 5 MG TABS tablet TAKE ONE TABLET BY MOUTH TWICE DAILY  . lisinopril (PRINIVIL,ZESTRIL) 40 MG tablet Take 1 tablet (40 mg total) by mouth daily.  [name] lovastatin (MEVACOR) 40 MG tablet TAKE TWO TABLETS BY MOUTH AT BEDTIME  . Multiple Vitamins-Minerals (PRESERVISION AREDS) CAPS Take 1 capsule by mouth 2 (two) times daily.   [name] UNABLE TO FIND Bevacizumab(AVASTIN) chemo injection for the eye every 6 weeks  . [DISCONTINUED] carvedilol (COREG) 12.5 MG tablet Take 1 tablet (12.5 mg total) by mouth 2 (two) times daily.    Allergies:   Patient has no known allergies.   Social History:  The patient  reports that he quit smoking about 37 years ago. His smoking use included cigarettes. He has a 20.00 pack-year smoking history. He has never used smokeless tobacco. He reports that he drinks about 14.0 standard drinks of alcohol per week. He reports that he does not use drugs.   Family History:  The patient's family history includes Cancer in his daughter; Dementia in his mother; Diabetes in his daughter and mother; Heart disease in his mother and son; Heart disease (age of onset: 33) in his father; Hypertension in his mother; Stroke in his sister.  ROS:   Review of Systems  Constitutional: Positive for malaise/fatigue. Negative for chills, diaphoresis, fever and weight loss.  HENT: Negative for congestion.   Eyes: Negative for discharge and redness.  Respiratory: Negative for cough, hemoptysis, sputum production, shortness of breath and wheezing.   Cardiovascular: Negative for chest pain, palpitations, orthopnea, claudication, leg swelling and PND.  Gastrointestinal: Negative for abdominal pain, blood in stool, heartburn, melena, nausea and vomiting.  Genitourinary: Negative for hematuria.  Musculoskeletal: Negative for falls and myalgias.  Skin: Negative for rash.    Neurological: Positive for dizziness and weakness. Negative for tingling, tremors, sensory change, speech change, focal weakness and loss of consciousness.  Endo/Heme/Allergies: Does not bruise/bleed easily.  Psychiatric/Behavioral: Negative for substance abuse. The patient is not nervous/anxious.   All other systems reviewed and are negative.    PHYSICAL EXAM:  VS:  BP (!) 142/78 (BP Location: Left Arm, Patient Position: Sitting, Cuff Size: Normal)   Pulse 63   Ht 5\' 11"  (1.803 m)   Wt 168 lb 4 oz (76.3 kg)   BMI 23.47 kg/m  BMI: Body mass index is 23.47 kg/m.  Physical Exam  Constitutional: He is oriented to person, place, and time. He appears well-developed and well-nourished.  HENT:  Head: Normocephalic and atraumatic.  Eyes: Right eye exhibits no discharge. Left eye exhibits no discharge.  Neck: Normal range of motion. No JVD present.  Cardiovascular: Normal rate, regular rhythm, S1 normal and S2 normal. Exam reveals no distant heart sounds, no friction rub, no midsystolic click and no opening snap.  Murmur heard.  Harsh midsystolic murmur is present  with a grade of 2/6 at the upper right sternal border radiating to the neck. Pulses:      Dorsalis pedis pulses are 2+ on the right side, and 2+ on the left side.       Posterior tibial pulses are 2+ on the right side, and 2+ on the left side.  Pulmonary/Chest: Effort normal and breath sounds normal. No respiratory distress. He has no decreased breath sounds. He has no wheezes. He has no rales. He exhibits no tenderness.  Abdominal: Soft. He exhibits no distension. There is no tenderness.  Musculoskeletal: He exhibits no edema.  Neurological: He is alert and oriented to person, place, and time.  Skin: Skin is warm and dry. No cyanosis. Nails show no clubbing.  Psychiatric: He has a normal mood and affect. His speech is normal and behavior is normal. Judgment and thought content normal.     EKG:  Was ordered and interpreted by  me today. Shows NSR with sinus arrhythmia, 63 bpm, RBBB (unchanged from prior) Recent Labs: 11/21/2017: ALT 15; BUN 15; Creatinine, Ser 0.85; Hemoglobin 14.0; Platelets 315; Potassium 4.8; Sodium 142  05/27/2017: Cholesterol 174; HDL 75; LDL Cholesterol (Calc) 78; Total CHOL/HDL Ratio 2.3; Triglycerides 124   CrCl cannot be calculated (Patient's most recent lab result is older than the maximum 21 days allowed.).   Wt Readings from Last 3 Encounters:  03/28/18 168 lb 4 oz (76.3 kg)  01/26/18 168 lb 12 oz (76.5 kg)  12/23/17 171 lb 6.4 oz (77.7 kg)     Other studies reviewed: Additional studies/records reviewed today include: summarized above  ASSESSMENT AND PLAN:  1. Essential hypertension: Patient reports anytime he goes to a medical office he becomes nervous leading to elevations in his blood pressure.  He reports blood pressure readings at home are typically in the 664Q to 034V systolic.  Since changing from metoprolol to carvedilol and with the addition of amlodipine blood pressure readings in the mornings and evenings continue to be in the 130s to 140s however, more often than not, his noon blood pressure readings are typically in the low 425Z to 56L systolic over 87F to 64P diastolic.  With this, he is lethargic and dizzy.  He indicates he is taking carvedilol 12.5 mg in the morning around 7 AM.  He then does not take any further antihypertensive medication until 7 PM when he takes his second dose of carvedilol 12.5 mg as well as lisinopril 40 mg and amlodipine 5 mg.  I question if the timing of his antihypertensives is accurate based on reported timing of BP readings.  Nonetheless, we will decrease his carvedilol to 6.25 mg in the morning and maintain him on carvedilol 12.5 mg every afternoon as well as lisinopril 40 mg every afternoon and amlodipine 5 mg every afternoon.  He will need close monitoring of his blood pressure.  2. PAF: Remains in sinus rhythm on carvedilol.  Tolerating Eliquis  5 mg twice daily without issue.  Recent lab work stable and unrevealing.  3. Aortic stenosis: Mild on most recent echocardiogram from 07/2016.  Repeat echocardiogram to trend.  4. Hyperlipidemia: Lipid profile from 07/2017 showed an LDL 78.  Remains on lovastatin.  Followed by PCP.  5. Left carotid artery stenosis: Followed by outside office.  Disposition: F/u with [name] or an APP in 1 month.  Current medicines are reviewed at length with the patient today.  The patient did not have any concerns regarding medicines.  Signed, Christell Faith, PA-C 03/28/2018  9:51 AM     [address] [place], [place] [address] [phone]

## 2018-03-28 ENCOUNTER — Ambulatory Visit (INDEPENDENT_AMBULATORY_CARE_PROVIDER_SITE_OTHER): Payer: Medicare Other | Admitting: Physician Assistant

## 2018-03-28 ENCOUNTER — Encounter: Payer: Self-pay | Admitting: Physician Assistant

## 2018-03-28 VITALS — BP 142/78 | HR 63 | Ht 71.0 in | Wt 168.2 lb

## 2018-03-28 DIAGNOSIS — I10 Essential (primary) hypertension: Secondary | ICD-10-CM

## 2018-03-28 DIAGNOSIS — I6522 Occlusion and stenosis of left carotid artery: Secondary | ICD-10-CM | POA: Diagnosis not present

## 2018-03-28 DIAGNOSIS — E785 Hyperlipidemia, unspecified: Secondary | ICD-10-CM | POA: Diagnosis not present

## 2018-03-28 DIAGNOSIS — I779 Disorder of arteries and arterioles, unspecified: Secondary | ICD-10-CM

## 2018-03-28 DIAGNOSIS — I480 Paroxysmal atrial fibrillation: Secondary | ICD-10-CM

## 2018-03-28 DIAGNOSIS — I739 Peripheral vascular disease, unspecified: Secondary | ICD-10-CM | POA: Diagnosis not present

## 2018-03-28 DIAGNOSIS — I350 Nonrheumatic aortic (valve) stenosis: Secondary | ICD-10-CM | POA: Diagnosis not present

## 2018-03-28 MED ORDER — CARVEDILOL 6.25 MG PO TABS: ORAL_TABLET | ORAL | 3 refills | Status: DC

## 2018-03-28 NOTE — Patient Instructions (Signed)
Medication Instructions:  CHANGE how you take the Carvedilol: Take 6.25 mg (one tablet) in the morning and 2 tablets (12.5 mg) in the evening.  If you need a refill on your cardiac medications before your next appointment, please call your pharmacy.   Lab work: None ordered  Testing/Procedures: Your physician has requested that you have an echocardiogram. Echocardiography is a painless test that uses sound waves to create images of your heart. It provides your doctor with information about the size and shape of your heart and how well your heart's chambers and valves are working. You may receive an ultrasound enhancing agent through an IV if needed to better visualize your heart during the echo.This procedure takes approximately one hour. There are no restrictions for this procedure. This will take place at the [facility] clinic.    Follow-Up: Your physician recommends that you schedule a follow-up appointment with [name] or and APP after the ECHO.

## 2018-03-30 DIAGNOSIS — H353211 Exudative age-related macular degeneration, right eye, with active choroidal neovascularization: Secondary | ICD-10-CM | POA: Diagnosis not present

## 2018-03-30 DIAGNOSIS — H353123 Nonexudative age-related macular degeneration, left eye, advanced atrophic without subfoveal involvement: Secondary | ICD-10-CM | POA: Diagnosis not present

## 2018-03-30 DIAGNOSIS — H35363 Drusen (degenerative) of macula, bilateral: Secondary | ICD-10-CM | POA: Diagnosis not present

## 2018-04-05 ENCOUNTER — Ambulatory Visit (INDEPENDENT_AMBULATORY_CARE_PROVIDER_SITE_OTHER): Payer: Medicare Other

## 2018-04-05 DIAGNOSIS — I350 Nonrheumatic aortic (valve) stenosis: Secondary | ICD-10-CM | POA: Diagnosis not present

## 2018-04-14 NOTE — Progress Notes (Signed)
Cardiology Office Note Date:  04/19/2018  Patient ID:  Digby, Groeneveld [birth year], MRN [id number] PCP:  Rita Ohara, MD  Cardiologist:  [name], MD    Chief Complaint: Follow-up  History of Present Illness: HARVIE MORUA is a 84 y.o. male with history of PAF on Eliquis, pulmonary hypertension, TIA/stroke, mild to moderate aortic stenosis, HTN, HLD, moderate left carotid artery stenosis and borderline diabetes who presents for follow-up of hypertension.  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 He underwent nuclear stress testing in 12/2014 that showed no evidence of significant ischemia with a normal EF. Echo in 07/2016 showed normal LVSF, mild aortic stenosis with a mean gradient of 15 mmHg, and no significant pulmonary hypertension. Patient was seen in the office on 01/26/2018 noting his BP had recently been elevated into the 786V systolic with readings consistently greater than 672 systolic. His BP at his visit on 8/29 was noted to be 216/108. He was changed from metoprolol to Coreg 12.5 mg bid and was started on amlodipine 5 mg daily. He was continued on lisinopril 40 mg, which had recently been increased by PCP.   He was seen in the office on 03/28/2018 for follow up of his HTN and reported since the above changes to his antihypertensive medications, his BP around noon was low with systolic readings in the 09O to low 100s. Morning BP readings were typically in the 709G to 283M systolic. He reported anxiety with MD offices, leading to elevations in his BP. He was taking Coreg 12.5 mg at 7 AM. In the evenings around 7 PM he was taking Coreg 12.5 mg, lisinopril 40 mg and amlodipine 5 mg. BP at that visit was 142/78. His AM dose of Coreg was decreased to 6.25 mg and he was continued on Coreg 12.5 mg at 7 PM along with lisinopril 40 mg and amlodipine 5 mg. Echo was performed on 04/05/2018 to trend his aortic stenosis and showed an EF of  55-60%, mild concentric LVH, no RWMA, normal LV diastolic function, mild to moderate aortic stenosis with a mean gradient of 21 mmHg, mildly dilated left atrium, RVSF normal, PASP 56 mmHg.   Patient comes in accompanied by his wife today and is doing well from a cardiac perspective.  Since decreasing his morning dose of carvedilol to 6.25 mg.LASTENCBP Readings have stabilized and are more consistent.  He no longer has significant drops into the 80s to low 629U systolic midday.  Blood pressure readings are typically in the 765Y to 650P systolic throughout the day now.  He is pleased with this improvement.  He notes his shortness of breath is about the same.  He denies any lower extremity swelling, abdominal distention, orthopnea, or early satiety.  No chest pain.  No dizziness, presyncope, or syncope.  No falls, BRBPR, or melena.  He has previously been advised to undergo a sleep study several years ago though for unclear reasons this did not occur.  Patient wife indicates he snores significantly and does have apneic episodes.  Patient previously smoked tobacco at 1 pack/day for 20 years quitting in 1982.  He continues to state he has whitecoat hypertension with BP as high as greater than 200 at physician's offices.   Past Medical History:  Diagnosis Date  . Acute CVA (cerebrovascular accident) ([place])   . Acute encephalopathy 11/27/2014  . Acute on chronic combined systolic and diastolic CHF (congestive heart failure) ([place])   .  Aortic atherosclerosis ([place]) 11/25/2014  . Atrial fibrillation ([place])   . Diabetes mellitus   . Heart murmur    mild aortic stenosis  . Hypercholesteremia   . Hypertension   . Impaired fasting glucose   . Macular degeneration   . Multiple lacunar infarcts ([place])   . Prostate cancer ([place]) radiation + seeding implant (Dr.[name])  . 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 Hilarie Fredrickson)  . 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 Rotator cuff tear, right 10/2010   supraspinatous and infraspinatous  . TIA (transient ischemic attack) 10/02/13   hosp at [facility] x 1 night  . 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 CATARACT EXTRACTION Right 01/2017  . CIRCUMCISION  01/2017   [name]  . COLONOSCOPY N/A 11/28/2012   Procedure: COLONOSCOPY;  Surgeon: Jerene Bears, MD;  Location: [facility] ENDOSCOPY;  Service: Gastroenterology;  Laterality: N/A;  . INTERNAL URETHROTOMY  01/27/2016   WF  . prostate seed implant    . ROTATOR CUFF REPAIR  06/16/2011   right (Dr. Berenice Primas)  . TONSILLECTOMY AND ADENOIDECTOMY  age 60    Current Meds  Medication Sig  . acetaminophen (TYLENOL ARTHRITIS PAIN) 650 MG CR tablet Take 1 tablet (650 mg total) by mouth every 8 (eight) hours as needed.  [name] amLODipine (NORVASC) 5 MG tablet Take 1 tablet (5 mg total) by mouth daily.  . betamethasone dipropionate (DIPROLENE) 0.05 % ointment Apply topically 2 (two) times daily.  . carvedilol (COREG) 6.25 MG tablet Take 6.25 mg, one tablet, in the morning and 2 tablets (12.5 mg) in the evening.  [name] ELIQUIS 5 MG TABS tablet TAKE ONE TABLET BY MOUTH TWICE DAILY  . lovastatin (MEVACOR) 40 MG tablet TAKE TWO TABLETS BY MOUTH AT BEDTIME  . Multiple Vitamins-Minerals (PRESERVISION AREDS) CAPS Take 1 capsule by mouth 2 (two) times daily.   [name] UNABLE TO FIND Bevacizumab(AVASTIN) chemo injection for the eye every 6 weeks    Allergies:   Patient has no known allergies.   Social History:  The patient  reports that he quit smoking about 37 years ago. His smoking use included cigarettes. He has a 20.00 pack-year smoking history. He has never used smokeless tobacco. He reports that he drinks about 14.0 standard drinks of alcohol per week. He reports that he does not use drugs.   Family History:  The patient's family history includes Cancer in his daughter;  Dementia in his mother; Diabetes in his daughter and mother; Heart disease in his mother and son; Heart disease (age of onset: 71) in his father; Hypertension in his mother; Stroke in his sister.  ROS:   Review of Systems  Constitutional: Negative for chills, diaphoresis, fever, malaise/fatigue and weight loss.  HENT: Negative for congestion.   Eyes: Negative for discharge and redness.  Respiratory: Positive for shortness of breath. Negative for cough, hemoptysis, sputum production and wheezing.   Cardiovascular: Negative for chest pain, palpitations, orthopnea, claudication, leg swelling and PND.  Gastrointestinal: Negative for abdominal pain, blood in stool, heartburn, melena, nausea and vomiting.  Genitourinary: Negative for hematuria.  Musculoskeletal: Negative for falls and myalgias.  Skin: Negative for rash.  Neurological: Negative for dizziness, tingling, tremors, sensory change, speech change, focal weakness, loss of consciousness and weakness.  Endo/Heme/Allergies: Does not bruise/bleed easily.  Psychiatric/Behavioral: Negative for substance abuse. The patient is  not nervous/anxious.   All other systems reviewed and are negative.    PHYSICAL EXAM:  VS:  BP (!) 180/80 (BP Location: Left Arm, Patient Position: Sitting, Cuff Size: Normal)   Pulse 64   Ht 5\' 11"  (1.803 m)   Wt 172 lb 4 oz (78.1 kg)   BMI 24.02 kg/m  BMI: Body mass index is 24.02 kg/m.  Physical Exam  Constitutional: He is oriented to person, place, and time. He appears well-developed and well-nourished.  HENT:  Head: Normocephalic and atraumatic.  Eyes: Right eye exhibits no discharge. Left eye exhibits no discharge.  Neck: Normal range of motion. No JVD present.  Cardiovascular: Normal rate, regular rhythm, S1 normal and S2 normal. Exam reveals no distant heart sounds, no friction rub, no midsystolic click and no opening snap.  Murmur heard.  Harsh midsystolic murmur is present with a grade of 2/6 at the  upper right sternal border radiating to the neck. Pulses:      Dorsalis pedis pulses are 2+ on the right side, and 2+ on the left side.       Posterior tibial pulses are 2+ on the right side, and 2+ on the left side.  Pulmonary/Chest: Effort normal and breath sounds normal. No respiratory distress. He has no decreased breath sounds. He has no wheezes. He has no rales. He exhibits no tenderness.  Abdominal: Soft. He exhibits no distension. There is no tenderness.  Musculoskeletal: He exhibits no edema.  Neurological: He is alert and oriented to person, place, and time.  Skin: Skin is warm and dry. No cyanosis. Nails show no clubbing.  Psychiatric: He has a normal mood and affect. His speech is normal and behavior is normal. Judgment and thought content normal.     EKG:  Was ordered and interpreted by me today. Shows NSR, 67 bpm, RBBB (unchanged from prior)  Recent Labs: 11/21/2017: ALT 15; BUN 15; Creatinine, Ser 0.85; Hemoglobin 14.0; Platelets 315; Potassium 4.8; Sodium 142  05/27/2017: Cholesterol 174; HDL 75; LDL Cholesterol (Calc) 78; Total CHOL/HDL Ratio 2.3; Triglycerides 124   CrCl cannot be calculated (Patient's most recent lab result is older than the maximum 21 days allowed.).   Wt Readings from Last 3 Encounters:  04/19/18 172 lb 4 oz (78.1 kg)  03/28/18 168 lb 4 oz (76.3 kg)  01/26/18 168 lb 12 oz (76.5 kg)     Other studies reviewed: Additional studies/records reviewed today include: summarized above  ASSESSMENT AND PLAN:  1. Essential hypertension: Blood pressure is elevated today in the office at 180/81, however patient has a history of whitecoat hypertension.  Blood pressure readings at home typically in the 409W to 119J systolic.  Continue carvedilol 6.25 mg in the morning with 12.5 mg in the evening.  He will also continue amlodipine 5 mg daily and lisinopril 40 mg daily.  Continue to monitor blood pressure.  If blood pressure starts to trend upwards contact our  office.  2. PAF: Remains in sinus rhythm on carvedilol.  Tolerating Eliquis 5 mg daily without issues.  Recent lab work unrevealing.  3. Mild to moderate aortic stenosis: Asymptomatic.  Continue to monitor with periodic echocardiogram in 12 months.  4. Pulmonary hypertension: Possibly in the setting of prior tobacco abuse as well as potential undiagnosed sleep apnea.  Refer to pulmonology for sleep study.  Offered patient low-dose diuretic however, given his history of prostate cancer status post radiation and urinary frequency he prefers to avoid diuretic therapy at this time.  We will  await results of his sleep study.  May ultimately end up needing right and left cardiac catheterization.  5. Hyperlipidemia: Lipid profile from 07/2017 showed an LDL of 78.  Remains on lovastatin.  Followed by PCP.  6. Sleep disordered breathing: Referral to pulmonology as above.  Disposition: F/u with [name] or an APP in 6 months, sooner if needed.  Current medicines are reviewed at length with the patient today.  The patient did not have any concerns regarding medicines.  Signed, Christell Faith, PA-C 04/19/2018 11:43 AM     [facility] [address] [place], [place] [address] [phone]

## 2018-04-19 ENCOUNTER — Ambulatory Visit (INDEPENDENT_AMBULATORY_CARE_PROVIDER_SITE_OTHER): Payer: Medicare Other | Admitting: Physician Assistant

## 2018-04-19 ENCOUNTER — Other Ambulatory Visit: Payer: Self-pay

## 2018-04-19 ENCOUNTER — Encounter: Payer: Self-pay | Admitting: Physician Assistant

## 2018-04-19 VITALS — BP 180/80 | HR 64 | Ht 71.0 in | Wt 172.2 lb

## 2018-04-19 DIAGNOSIS — I480 Paroxysmal atrial fibrillation: Secondary | ICD-10-CM

## 2018-04-19 DIAGNOSIS — I350 Nonrheumatic aortic (valve) stenosis: Secondary | ICD-10-CM | POA: Diagnosis not present

## 2018-04-19 DIAGNOSIS — I6522 Occlusion and stenosis of left carotid artery: Secondary | ICD-10-CM

## 2018-04-19 DIAGNOSIS — I10 Essential (primary) hypertension: Secondary | ICD-10-CM | POA: Diagnosis not present

## 2018-04-19 DIAGNOSIS — G4730 Sleep apnea, unspecified: Secondary | ICD-10-CM | POA: Diagnosis not present

## 2018-04-19 DIAGNOSIS — E782 Mixed hyperlipidemia: Secondary | ICD-10-CM | POA: Diagnosis not present

## 2018-04-19 DIAGNOSIS — I272 Pulmonary hypertension, unspecified: Secondary | ICD-10-CM

## 2018-04-19 NOTE — Patient Instructions (Signed)
Medication Instructions:  No changes  If you need a refill on your cardiac medications before your next appointment, please call your pharmacy.   Lab work: None ordered  Testing/Procedures: None ordered  Follow-Up: At [facility], you and your health needs are our priority.  As part of our continuing mission to provide you with exceptional heart care, we have created designated Provider Care Teams.  These Care Teams include your primary Cardiologist (physician) and Advanced Practice Providers (APPs -  Physician Assistants and Nurse Practitioners) who all work together to provide you with the care you need, when you need it. You will need a follow up appointment in 6 months.  Please call our office 2 months in advance to schedule this appointment.  You may see [name] or one of the following Advanced Practice Providers on your designated Care Team:   Murray Hodgkins, NP Christell Faith, PA-C . Marrianne Mood, PA-C  Any Other Special Instructions Will Be Listed Below (If Applicable). A referral has been placed for a Pulmonologist for a possible sleep study

## 2018-04-28 ENCOUNTER — Emergency Department
Admission: EM | Admit: 2018-04-28 | Discharge: 2018-04-28 | Disposition: A | Discharge status: Home / Self Care | Payer: Medicare Other | Attending: Emergency Medicine | Admitting: Emergency Medicine

## 2018-04-28 ENCOUNTER — Other Ambulatory Visit: Payer: Self-pay

## 2018-04-28 ENCOUNTER — Emergency Department: Payer: Medicare Other

## 2018-04-28 ENCOUNTER — Encounter: Payer: Self-pay | Admitting: Emergency Medicine

## 2018-04-28 DIAGNOSIS — G40A09 Absence epileptic syndrome, not intractable, without status epilepticus: Secondary | ICD-10-CM | POA: Diagnosis not present

## 2018-04-28 DIAGNOSIS — Z7901 Long term (current) use of anticoagulants: Secondary | ICD-10-CM | POA: Insufficient documentation

## 2018-04-28 DIAGNOSIS — I5043 Acute on chronic combined systolic (congestive) and diastolic (congestive) heart failure: Secondary | ICD-10-CM | POA: Diagnosis not present

## 2018-04-28 DIAGNOSIS — Z8546 Personal history of malignant neoplasm of prostate: Secondary | ICD-10-CM | POA: Insufficient documentation

## 2018-04-28 DIAGNOSIS — R55 Syncope and collapse: Secondary | ICD-10-CM | POA: Diagnosis not present

## 2018-04-28 DIAGNOSIS — R4182 Altered mental status, unspecified: Secondary | ICD-10-CM | POA: Diagnosis present

## 2018-04-28 DIAGNOSIS — I110 Hypertensive heart disease with heart failure: Secondary | ICD-10-CM | POA: Diagnosis not present

## 2018-04-28 DIAGNOSIS — Z79899 Other long term (current) drug therapy: Secondary | ICD-10-CM | POA: Diagnosis not present

## 2018-04-28 DIAGNOSIS — R569 Unspecified convulsions: Secondary | ICD-10-CM | POA: Insufficient documentation

## 2018-04-28 DIAGNOSIS — E119 Type 2 diabetes mellitus without complications: Secondary | ICD-10-CM | POA: Diagnosis not present

## 2018-04-28 DIAGNOSIS — Z87891 Personal history of nicotine dependence: Secondary | ICD-10-CM | POA: Diagnosis not present

## 2018-04-28 LAB — CBC WITH DIFFERENTIAL/PLATELET
ABS IMMATURE GRANULOCYTES: 0.04 10*3/uL (ref 0.00–0.07)
Basophils Absolute: 0 10*3/uL (ref 0.0–0.1)
Basophils Relative: 1 %
Eosinophils Absolute: 0.1 10*3/uL (ref 0.0–0.5)
Eosinophils Relative: 1 %
HCT: 35.9 % — ABNORMAL LOW (ref 39.0–52.0)
Hemoglobin: 11.4 g/dL — ABNORMAL LOW (ref 13.0–17.0)
IMMATURE GRANULOCYTES: 1 %
LYMPHS PCT: 19 %
Lymphs Abs: 1.5 10*3/uL (ref 0.7–4.0)
MCH: 33.3 pg (ref 26.0–34.0)
MCHC: 31.8 g/dL (ref 30.0–36.0)
MCV: 105 fL — ABNORMAL HIGH (ref 80.0–100.0)
MONOS PCT: 11 %
Monocytes Absolute: 0.9 10*3/uL (ref 0.1–1.0)
NEUTROS ABS: 5.2 10*3/uL (ref 1.7–7.7)
NEUTROS PCT: 67 %
NRBC: 0 % (ref 0.0–0.2)
PLATELETS: 262 10*3/uL (ref 150–400)
RBC: 3.42 MIL/uL — AB (ref 4.22–5.81)
RDW: 12.6 % (ref 11.5–15.5)
WBC: 7.6 10*3/uL (ref 4.0–10.5)

## 2018-04-28 LAB — COMPREHENSIVE METABOLIC PANEL
ALT: 22 U/L (ref 0–44)
AST: 24 U/L (ref 15–41)
Albumin: 4.2 g/dL (ref 3.5–5.0)
Alkaline Phosphatase: 28 U/L — ABNORMAL LOW (ref 38–126)
Anion gap: 7 (ref 5–15)
BUN: 25 mg/dL — AB (ref 8–23)
CHLORIDE: 106 mmol/L (ref 98–111)
CO2: 28 mmol/L (ref 22–32)
Calcium: 8.8 mg/dL — ABNORMAL LOW (ref 8.9–10.3)
Creatinine, Ser: 0.97 mg/dL (ref 0.61–1.24)
GFR calc Af Amer: >60 mL/min (ref >60)
GFR calc non Af Amer: >60 mL/min (ref >60)
GLUCOSE: 144 mg/dL — AB (ref 70–99)
Potassium: 4.3 mmol/L (ref 3.5–5.1)
SODIUM: 141 mmol/L (ref 135–145)
Total Bilirubin: 0.5 mg/dL (ref 0.3–1.2)
Total Protein: 6.8 g/dL (ref 6.5–8.1)

## 2018-04-28 LAB — TROPONIN I: Troponin I: <0.03 ng/mL (ref <0.03)

## 2018-04-28 MED ORDER — SODIUM CHLORIDE 0.9 % IV BOLUS
500.0000 mL | Freq: Once | INTRAVENOUS | Status: AC
  Administered 2018-04-28: 500 mL via INTRAVENOUS

## 2018-04-28 NOTE — ED Notes (Signed)
Dr Alfred Levins at bedside for re-eval

## 2018-04-28 NOTE — ED Provider Notes (Signed)
[facility] Emergency Department Provider Note  ____________________________________________  Time seen: Approximately 12:01 PM  I have reviewed the triage vital signs and the nursing notes.   HISTORY  Chief Complaint Loss of Consciousness   HPI DERWARD MARPLE is a 84 y.o. male with a history ofCVA, A. fib on Eliquis, CHF, diabetes, hypertension, hyperlipidemia, prostate cancer in remission who presents for evaluation of a change level of consciousness.  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  The family describes one monthly episode where patient will stare with no loss of consciousness, no loss of muscle tone, no loss of urine or bowel, no shaking-like activities.  These episodes usually lasts a few seconds and resolve without intervention or postictal phase.  Over the last week patient has had 3 episodes in the one today lasted longer than most.  According to the wife patient had just come home from [place].  He was sitting on the table trying to open a box.  He then started to stare and would not respond to her.  She reports that this episode lasted several minutes.  No shaking like activity, no loss of muscle tone, patient kept his eyes open while doing that.  No urinary or bowel loss.  When patient regained consciousness he was back to his baseline with no confusion.  When EMS arrived at the house they noted that patient was orthostatics with blood pressure dropping from 150/70 to 63/30 however orthostatic vital signs were repeated upon arrival to the emergency room and those were negative.  Patient has no recollection of these events, he denies chest pain, shortness of breath, dizziness, headache, abdominal pain, back pain.  Has had normal appetite, had breakfast this morning.  He did take his medications.  Past Medical History:    Diagnosis Date  . Acute CVA (cerebrovascular accident) ([place])   . Acute encephalopathy 11/27/2014  . Acute on chronic combined systolic and diastolic CHF (congestive heart failure) ([place])   . Aortic atherosclerosis ([place]) 11/25/2014  . Atrial fibrillation ([place])   . Diabetes mellitus   . Heart murmur    mild aortic stenosis  . Hypercholesteremia   . Hypertension   . Impaired fasting glucose   . Macular degeneration   . Multiple lacunar infarcts ([place])   . Prostate cancer ([place]) radiation + seeding implant (Dr.[name])  . 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 Hilarie Fredrickson)  . [facility] spotted fever) 10/2014   (hosp with FUO, encephalopathy)  . Rotator cuff tear, right 10/2010   supraspinatous and infraspinatous  . TIA (transient ischemic attack) 10/02/13   hosp at [facility] x 1 night  . Type 2 diabetes mellitus with microalbuminuria or microproteinuria 01/15/2014    Patient Active Problem List   Diagnosis Date Noted  . Long term current use of anticoagulant 05/28/2017  . Type 2 diabetes mellitus with microalbuminuria, without long-term current use of insulin ([place]) 05/28/2017  . H/O [facility] spotted fever 07/30/2015  . Aortic valve stenosis 04/11/2015  . Debility 02/04/2015  . Cardiomyopathy ([place]) 01/09/2015  . Paroxysmal atrial fibrillation ([place]) 12/24/2014  . Stroke with cerebral ischemia ([place]) 12/03/2014  . Pre-diabetes   . Acute on chronic combined systolic and diastolic CHF (congestive heart failure) ([place])   . Acute CVA (cerebrovascular accident) ([place])   . Left carotid artery  stenosis 11/30/2014  . Lacunar infarct, acute ([place])   . Aortic atherosclerosis ([place]) 11/25/2014  . DM (diabetes mellitus), type 2 with peripheral vascular complications ([place]) 16/03/9603  . Awareness alteration, transient 12/26/2013  . Low serum testosterone level 12/25/2013  . TIA (transient ischemic attack) 10/10/2013  . Radiation proctitis 11/28/2012  .  Colon cancer screening 11/28/2012  . Murmur 02/23/2011  . Type 2 diabetes mellitus with hypercholesterolemia ([place]) 02/05/2011  . Essential hypertension, benign 11/26/2010  . Impaired fasting glucose 11/26/2010  . Pure hypercholesterolemia 11/26/2010  . Prostate cancer ([place]) 11/26/2010    Past Surgical History:  Procedure Laterality Date  . CATARACT EXTRACTION Right 01/2017  . CIRCUMCISION  01/2017   [name]  . COLONOSCOPY N/A 11/28/2012   Procedure: COLONOSCOPY;  Surgeon: Jerene Bears, MD;  Location: [facility] ENDOSCOPY;  Service: Gastroenterology;  Laterality: N/A;  . INTERNAL URETHROTOMY  01/27/2016   WF  . prostate seed implant    . ROTATOR CUFF REPAIR  06/16/2011   right (Dr. Berenice Primas)  . TONSILLECTOMY AND ADENOIDECTOMY  age 58    Prior to Admission medications   Medication Sig Start Date End Date Taking? Authorizing Provider  acetaminophen (TYLENOL ARTHRITIS PAIN) 650 MG CR tablet Take 1 tablet (650 mg total) by mouth every 8 (eight) hours as needed. 12/10/14  Yes Love, Ivan Anchors, PA-C  amLODipine (NORVASC) 5 MG tablet Take 1 tablet (5 mg total) by mouth daily. 01/26/18 04/28/18 Yes Wellington Hampshire, MD  betamethasone dipropionate (DIPROLENE) 0.05 % ointment Apply topically 2 (two) times daily.   Yes [provider]  carvedilol (COREG) 6.25 MG tablet Take 6.25 mg, one tablet, in the morning and 2 tablets (12.5 mg) in the evening. 03/28/18  Yes Dunn, Ryan M, PA-C  ELIQUIS 5 MG TABS tablet TAKE ONE TABLET BY MOUTH TWICE DAILY 05/02/17  Yes Wellington Hampshire, MD  lisinopril (PRINIVIL,ZESTRIL) 40 MG tablet Take 1 tablet (40 mg total) by mouth daily. 07/26/17 04/28/18 Yes Wellington Hampshire, MD  lovastatin (MEVACOR) 40 MG tablet TAKE TWO TABLETS BY MOUTH AT BEDTIME 03/16/18  Yes Rita Ohara, MD  Multiple Vitamins-Minerals (PRESERVISION AREDS) CAPS Take 1 capsule by mouth 2 (two) times daily.    Yes [provider]  UNABLE TO FIND Bevacizumab(AVASTIN) chemo injection for the eye  every 6 weeks   Yes [provider]    Allergies Patient has no known allergies.  Family History  Problem Relation Age of Onset  . Heart disease Father 21       Died suddenly.  No diagnosis  . Stroke Sister   . Dementia Mother   . Diabetes Mother   . Heart disease Mother   . Hypertension Mother   . Cancer Daughter        ovarian (?) vs other male cancer; s/p hyst doing well  . Diabetes Daughter        GDM, and now AODM  . Heart disease Son        congestive heart failure--improved    Social History Social History   Tobacco Use  . Smoking status: Former Smoker    Packs/day: 1.00    Years: 20.00    Pack years: 20.00    Types: Cigarettes    Last attempt to quit: 05/31/1980    Years since quitting: 37.9  . Smokeless tobacco: Never Used  Substance Use Topics  . Alcohol use: Yes    Alcohol/week: 14.0 standard drinks    Types: 14 Cans of beer per  week    Comment: 2 cans of beer most days of the week  . Drug use: No    Review of Systems  Constitutional: Negative for fever. + change in level of consciousness Eyes: Negative for visual changes. ENT: Negative for sore throat. Neck: No neck pain  Cardiovascular: Negative for chest pain. Respiratory: Negative for shortness of breath. Gastrointestinal: Negative for abdominal pain, vomiting or diarrhea. Genitourinary: Negative for dysuria. Musculoskeletal: Negative for back pain. Skin: Negative for rash. Neurological: Negative for headaches, weakness or numbness. Psych: No SI or HI  ____________________________________________   PHYSICAL EXAM:  VITAL SIGNS: ED Triage Vitals  Enc Vitals Group     BP 04/28/18 1017 (!) 155/71     Pulse Rate 04/28/18 1017 65     Resp 04/28/18 1017 18     Temp 04/28/18 1017 97.8 F (36.6 C)     Temp src --      SpO2 04/28/18 1017 99 %     Weight 04/28/18 1019 170 lb (77.1 kg)     Height 04/28/18 1019 5\' 11"  (1.803 m)     Head Circumference --      Peak Flow --       Pain Score 04/28/18 1018 0     Pain Loc --      Pain Edu? --      Excl. in [place]? --     Constitutional: Alert and oriented. Well appearing and in no apparent distress. HEENT:      Head: Normocephalic and atraumatic.         Eyes: Conjunctivae are normal. Sclera is non-icteric.       Mouth/Throat: Mucous membranes are moist.       Neck: Supple with no signs of meningismus. Cardiovascular: Regular rate and rhythm. IV/VI systolic murmur loudest at the left upper sternal border. no gallops, or rubs. 2+ symmetrical distal pulses are present in all extremities. No JVD. Respiratory: Normal respiratory effort. Lungs are clear to auscultation bilaterally. No wheezes, crackles, or rhonchi.  Gastrointestinal: Soft, non tender, and non distended with positive bowel sounds. No rebound or guarding. Genitourinary: No CVA tenderness. Musculoskeletal: Nontender with normal range of motion in all extremities. No edema, cyanosis, or erythema of extremities. Neurologic: Normal speech and language. A & O x3, PERRL, EOMI, no nystagmus, CN II-XII intact, motor testing reveals good tone and bulk throughout. There is no evidence of pronator drift or dysmetria. Muscle strength is 5/5 throughout. Sensory examination is intact. Gait is normal. Skin: Skin is warm, dry and intact. No rash noted. Psychiatric: Mood and affect are normal. Speech and behavior are normal.  ____________________________________________   LABS (all labs ordered are listed, but only abnormal results are displayed)  Labs Reviewed  CBC WITH DIFFERENTIAL/PLATELET - Abnormal; Notable for the following components:      Result Value   RBC 3.42 (*)    Hemoglobin 11.4 (*)    HCT 35.9 (*)    MCV 105.0 (*)    All other components within normal limits  COMPREHENSIVE METABOLIC PANEL - Abnormal; Notable for the following components:   Glucose, Bld 144 (*)    BUN 25 (*)    Calcium 8.8 (*)    Alkaline Phosphatase 28 (*)    All other components  within normal limits  TROPONIN I   ____________________________________________  EKG  ED ECG REPORT I, Rudene Re, the attending physician, personally viewed and interpreted this ECG.  Normal sinus rhythm, rate of 68, right bundle branch block, right  axis deviation, no ST elevations or depressions.  Unchanged from prior. ____________________________________________  RADIOLOGY  I have personally reviewed the images performed during this visit and I agree with the Radiologist's read.   Interpretation by Radiologist:  Ct Head Wo Contrast  Result Date: 04/28/2018 CLINICAL DATA:  Patient with syncopal episode. EXAM: CT HEAD WITHOUT CONTRAST TECHNIQUE: Contiguous axial images were obtained from the base of the skull through the vertex without intravenous contrast. COMPARISON:  Brain CT 11/27/2014 FINDINGS: Brain: Ventricles and sulci are prominent compatible with atrophy. Periventricular and subcortical white matter hypodensity compatible with chronic microvascular ischemic changes. No evidence for acute cortically based infarct, intracranial hemorrhage, mass lesion or mass-effect. Vascular: Unremarkable Skull: Intact Sinuses/Orbits: Paranasal sinuses well aerated. Mastoid air cells are unremarkable. Orbits unremarkable. Other: None. IMPRESSION: No acute intracranial process. Atrophy and chronic microvascular ischemic changes. Electronically Signed   By: Lovey Newcomer M.D.   On: 04/28/2018 11:25     ____________________________________________   PROCEDURES  Procedure(s) performed: None Procedures Critical Care performed:  None ____________________________________________   INITIAL IMPRESSION / ASSESSMENT AND PLAN / ED COURSE   84 y.o. male with a history ofCVA, A. fib on Eliquis, CHF, diabetes, hypertension, hyperlipidemia, prostate cancer in remission who presents for evaluation of a change level of consciousness.  Patient seems to be having these events almost on a monthly  basis for at least 3 years.  Did have increase number of events over the last week.  Unknown triggers.  Patient the description by the family these make me concern for absence seizures.  Does not seem like patient actually had syncope with no loss of tone.  Patient did undergo EEG in 2016 which was unremarkable.  Has not seen his neurologist since.  Labs did not show any electrolyte abnormalities including no hypoglycemia, hypo or hypernatremia, or significant hypo or hypercalcemia. Head CT was done as patient is on a blood thinner to rule out intracranial hemorrhage and that is negative.  EKG showing no evidence of dysrhythmias or ischemia.  I do not believe patient warrants admission at this time especially since we are unable to perform EEG over the weekend.  Since these episodes have been happening for the last 3 years I believe that he is able to follow-up with his doctor this coming week for an outpatient EEG.  I did however discussed with patient, and his wife and daughter strict seizure precautions including not driving until cleared by neurology.      As part of my medical decision making, I reviewed the following data within the [place] notes reviewed and incorporated, Labs reviewed , EKG interpreted , Old EKG reviewed, Old chart reviewed, Radiograph reviewed , Notes from prior ED visits and [place] Controlled Substance Database    Pertinent labs & imaging results that were available during my care of the patient were reviewed by me and considered in my medical decision making (see chart for details).    ____________________________________________   FINAL CLINICAL IMPRESSION(S) / ED DIAGNOSES  Final diagnoses:  Absence seizure ([place])      NEW MEDICATIONS STARTED DURING THIS VISIT:  ED Discharge Orders    None       Note:  This document was prepared using Dragon voice recognition software and may include unintentional dictation errors.    Rudene Re, MD 04/28/18 [id number]

## 2018-04-28 NOTE — ED Notes (Signed)
Pt assisted to the toilet with minimal assistance, pt denies dizziness or weakness, gait and stance steady, no distress noted, cont to monitor

## 2018-04-28 NOTE — ED Triage Notes (Signed)
PT to ER via EMS from home with report of syncopal episode.  Pt was orthostatic hypotension with EMS.  Sitting 150/77 standing 63/30

## 2018-04-28 NOTE — Discharge Instructions (Addendum)
Seizures may happen at any time. It is important to take certain precautions to maintain your safety. DO NOT DRIVE OR OPERATE MACHINERY UNTIL CLEARED BY NEUROLOGY.  Follow up with your doctor in 1-3 days.  If you were started on a seizure medication, take it as prescribed.  During a seizure, a person may injure himself or herself. Seizure precautions are guidelines that a person can follow in order to minimize injury during a seizure. For any activity, it is important to ask, "What would happen if I had a seizure while doing this?" Follow the below precautions.  Bathroom Safety  A person with seizures may want to shower instead of bathe to avoid accidental drowning. If falls occur during the patient's typical seizure, a person should use a shower seat, preferably one with a safety strap.  Use nonskid strips in your shower or tub.  Never use electrical equipment near water. This prevents accidental electrocution.  Consider changing glass in shower doors to shatterproof glass.  Risk analyst If possible, cook when someone else is nearby.  Use the back burners of the stove to prevent accidental burns.  Use shatterproof containers as much as possible. For instance, sauces can be transferred from glass bottles to plastic containers for use.  Limit time that is required using knives or other sharp objects. If possible, buy foods that are already cut, or ask someone to help in meal preparation.   General Safety at [employer] not smoke or light fires in the fireplace unless someone else is present.  Do not use space heaters that can be accidentally overturned.  When alone, avoid using step stools or ladders, and do not clean rooftop gutters.  Purchase power tools and motorized Company secretary which have a safety switch that will stop the machine if you release the handle (a 'dead man's' switch).   Driving and Transportation DO NOT [place] and/or you have permission  to drive from your state's Department of Motor Vehicles  [facility]). Each state has different laws. Please refer to the following link on the [place] website for more information: http://www.epilepsyfoundation.org/answerplace/Social/driving/drivingu.cfm  If you ride a bicycle, wear a helmet and any other necessary protective gear.  When taking public transportation like the bus or subway, stay clear of the platform edge.   Outdoor Insurance underwriter is okay, but does present certain risks. Never swim alone, and tell friends what to do if you have a seizure while swimming.  Wear appropriate protective equipment.  Ski with a friend. If a seizure occurs, your friend can seek help, if needed. He or she can also help to get you out of the cold. Consider using a safety hook or belt while riding the ski lift.

## 2018-04-28 NOTE — ED Notes (Signed)
Pt standing with assistance to void, no distress noted at this time

## 2018-05-02 ENCOUNTER — Encounter: Payer: Self-pay | Admitting: Neurology

## 2018-05-02 ENCOUNTER — Ambulatory Visit (INDEPENDENT_AMBULATORY_CARE_PROVIDER_SITE_OTHER): Payer: Medicare Other | Admitting: Neurology

## 2018-05-02 ENCOUNTER — Telehealth: Payer: Self-pay | Admitting: Neurology

## 2018-05-02 VITALS — BP 168/83 | HR 62 | Ht 71.0 in | Wt 170.8 lb

## 2018-05-02 DIAGNOSIS — I6522 Occlusion and stenosis of left carotid artery: Secondary | ICD-10-CM | POA: Diagnosis not present

## 2018-05-02 DIAGNOSIS — G40909 Epilepsy, unspecified, not intractable, without status epilepticus: Secondary | ICD-10-CM | POA: Diagnosis not present

## 2018-05-02 DIAGNOSIS — R251 Tremor, unspecified: Secondary | ICD-10-CM

## 2018-05-02 MED ORDER — LEVETIRACETAM ER 500 MG PO TB24: 500.0000 mg | ORAL_TABLET | Freq: Every day | ORAL | 3 refills | Status: DC

## 2018-05-02 NOTE — Patient Instructions (Signed)
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  These may be a late effect of his remote history of [facility] spotted fever and encephalitis.  I recommend trial of Keppra XR 500 mg daily and check EEG and MRI scan of the brain.  He was advised not to drive for the next several months till next visit at least.  He also has tremors and mild symptoms of early Parkinson's but these are not symptomatic and will hold off on medication at the time being.  He will return for follow-up in 3 months or call earlier if necessary.

## 2018-05-02 NOTE — Progress Notes (Signed)
[facility] [address] [place]. [place] [address] [phone]       OFFICE CONSULT NOTE  [name] Date of Birth:  [birth year] Medical Record Number:  [id number]   Referring MD:   Rudene Re Reason for Referral:   seizures  HPI: Mr Jeffrey [name] is a pleasant 84 year old Caucasian male was seen today for initial office consultation visit.  He is accompanied by his wife and daughter.  History is obtained from them and review of referral notes.  I have reviewed imaging films in PACS.  He has been having multiple brief episodes of altered awareness for several years but these appear to have increased in the last 6 months and frequency as well as duration.  He had a prolonged episode recently in November 2019 when he went to the ER.  CT scan of the head was obtained which I reviewed showed no acute abnormality except changes of age-related small vessel disease.  He is recently had a few back-to-back episodes as well.  He is unable to identify specific trigger for his episodes.  He is usually staring during these episodes and does not respond to commands.  He does not lose consciousness fall or hurt himself.  His episodes were previously lasting less than a minute the dose of few recent ones have been more prolonged.  Following some of these episodes he tends to get agitated and disoriented.  The patient's daughter feels she is noticed that he does some automatic hand movements and some of these episodes.  He denies any headache or any aura prior to these episodes.  He has no remote history of head injury with loss of consciousness intracranial hemorrhage or strokes.  There is no family history of epilepsy or seizures.  There is no specific pattern for these episodes which may occur once every few months to several in a week.  He has not had an EEG or MRI scan done or a trial of seizure medications. He had prior history of right anterior frontal lacunar infarct in 11/27/2014 he was  felt to have asymptomatic 80 to 90% left ICA stenosis at that time.  Echocardiogram was normal.  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  He was treated with a 2-week course of doxycycline.  He did not have any definite seizures at that time.  He had episode of brief staring with a blank look on his face in May 2016 and was admitted to [facility] where he had an EEG which was negative for seizures.  He was seen by Dr. Theador Hawthorne neurologist as an outpatient and had a negative work-up for his seizures as well.  He was referred to Dr. Juanda Crumble feels vascular surgeon for left carotid stenosis follow-up with ultrasound in the office showed stenosis to be much less and hence conservative follow-up was recommended.  He was seen by me in the office in March 2017 but has not followed up since then.  He was found to subsequently have a transient episode of paroxysmal A. fib and was started on Eliquis by cardiologist [name].  He also had a second EEG done in September 2016 which was also unremarkable except for mild generalized slowing. ROS:   14 system review of systems is positive for hearing loss, snoring, joint pain, passing out, speech difficulty, tremors and all other systems negative  PMH:  Past Medical History:  Diagnosis Date  . Acute CVA (cerebrovascular accident) ([place])   .  Acute encephalopathy 11/27/2014  . Acute on chronic combined systolic and diastolic CHF (congestive heart failure) ([place])   . Aortic atherosclerosis ([place]) 11/25/2014  . Atrial fibrillation ([place])   . Diabetes mellitus   . Heart murmur    mild aortic stenosis  . Hypercholesteremia   . Hypertension   . Impaired fasting glucose   . Macular degeneration   . Multiple lacunar infarcts ([place])   . Prostate cancer ([place]) radiation + seeding implant (Dr.[name])  . 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 Hilarie Fredrickson)  . 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 Rotator cuff tear, right 10/2010   supraspinatous and infraspinatous  . TIA (transient ischemic attack) 10/02/13   hosp at [facility] x 1 night  . 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 Marital status: Married    Spouse name: Not on file  . Number of children: 4  . Years of education: Not on file  . Highest education level: Not on file  Occupational History  . Occupation: Retired  Scientific laboratory technician  . Financial resource strain: Not on file  . Food insecurity:    Worry: Not on file    Inability: Not on file  . Transportation needs:    Medical: Not on file    Non-medical: Not on file  Tobacco Use  . Smoking status: Former Smoker    Packs/day: 1.00    Years: 20.00    Pack years: 20.00    Types: Cigarettes    Last attempt to quit: 05/31/1980    Years since quitting: 37.9  . Smokeless tobacco: Never Used  Substance and Sexual Activity  . Alcohol use: Yes    Alcohol/week: 14.0 standard drinks    Types: 14 Cans of beer per week    Comment: 2 cans of beer most days of the week  . Drug use: No  . Sexual activity: Not Currently    Partners: Female    Comment: issues with ED  Lifestyle  . Physical activity:    Days per week: Not on file    Minutes per session: Not on file  . Stress: Not on file  Relationships  . Social connections:    Talks on phone: Not on file    Gets together: Not on file    Attends religious service: Not on file    Active member of club or organization: Not on file    Attends meetings of clubs or organizations: Not on file    Relationship status: Not on file  . Intimate partner violence:    Fear of current or ex partner: Not on file    Emotionally abused: Not on file    Physically abused: Not on file    Forced sexual activity: Not on file  Other Topics Concern  . Not on file  Social History Narrative    Married. Retired Engineer, structural.  2 sons, 2 daughters (all in [place]), 15 grandchildren, 7 great grandchild, 2 more expected    Medications:   Current Outpatient Medications on File Prior to Visit  Medication Sig Dispense Refill  . acetaminophen (TYLENOL ARTHRITIS PAIN) 650 MG CR tablet Take 1 tablet (650 mg total) by mouth every 8 (eight) hours as needed.    . betamethasone dipropionate (DIPROLENE) 0.05 % ointment Apply topically 2 (two) times daily.    [name]  carvedilol (COREG) 6.25 MG tablet Take 6.25 mg, one tablet, in the morning and 2 tablets (12.5 mg) in the evening. 180 tablet 3  . ELIQUIS 5 MG TABS tablet TAKE ONE TABLET BY MOUTH TWICE DAILY 180 tablet 3  . lovastatin (MEVACOR) 40 MG tablet TAKE TWO TABLETS BY MOUTH AT BEDTIME 180 tablet 0  . Multiple Vitamins-Minerals (PRESERVISION AREDS) CAPS Take 1 capsule by mouth 2 (two) times daily.     [name] UNABLE TO FIND Bevacizumab(AVASTIN) chemo injection for the eye every 6 weeks    . amLODipine (NORVASC) 5 MG tablet Take 1 tablet (5 mg total) by mouth daily. 90 tablet 3  . lisinopril (PRINIVIL,ZESTRIL) 40 MG tablet Take 1 tablet (40 mg total) by mouth daily. 90 tablet 3   No current facility-administered medications on file prior to visit.     Allergies:  No Known Allergies  Physical Exam General: Frail elderly Caucasian male seated, in no evident distress Head: head normocephalic and atraumatic.   Neck: supple with no carotid or supraclavicular bruits Cardiovascular: regular rate and rhythm, no murmurs Musculoskeletal: no deformity Skin:  no rash/petichiae Vascular:  Normal pulses all extremities  Neurologic Exam Mental Status: Awake and fully alert. Oriented to place and time. Recent and remote memory intact. Attention span, concentration and fund of knowledge appropriate. Mood and affect appropriate.  Positive glabellar tap.  Diminished facial expression. Cranial Nerves: Fundoscopic exam reveals sharp disc margins. Pupils equal, briskly  reactive to light. Extraocular movements full without nystagmus. Visual fields full to confrontation. Hearing diminished bilaterally. Facial sensation intact. Face, tongue, palate moves normally and symmetrically.  Motor: Normal bulk and tone. Normal strength in all tested extremity muscles.  Mild action tremor of outstretched upper extremities left greater than right.  Intermittent head tremor.  Cogwheel rigidity left greater than right wrist upon activation mainly.  No bradykinesia.  Able to arise from chair with arms folded across her chest.  Mild retropulsion present. Sensory.: intact to touch , pinprick , position and vibratory sensation.  Coordination: Rapid alternating movements normal in all extremities. Finger-to-nose and heel-to-shin performed accurately bilaterally. Gait and Station: Arises from chair without difficulty. Stance is normal. Gait demonstrates normal stride length and balance but slight diminished left arm swing.  No festination no stooped posture.. Able to heel, toe and tandem walk without difficulty.  Reflexes: 1+ and symmetric. Toes downgoing.      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  He has previously had EEG done twice which has been negative for epileptiform activity.  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  These may be a late effect of his remote history of [facility] spotted fever and encephalitis.  I recommend trial of Keppra XR 500 mg daily and check EEG  and MRI scan of the brain.  He was advised not to drive for the next several months till next visit at least.  He also has tremors and mild symptoms of early Parkinson's but these are not symptomatic and will hold off on medication at the time being.  Greater than 50% time during this 45-minute consultation visit was spent on counseling and coordination of care and discussion about complex partial seizures and parkinsonian tremor and answering questions he will return for follow-up in 3 months or call earlier if necessary. Antony Contras, MD Medical Director [facility] Pager: [phone] 05/02/2018 6:23 PM Note: This document was prepared with digital dictation and possible smart phrase technology. Any transcriptional errors that result from this process are unintentional.

## 2018-05-02 NOTE — Telephone Encounter (Signed)
medicare/BCBS supp no auth faxed order to [facility] they will reach out to the pt to schedule.

## 2018-05-03 ENCOUNTER — Ambulatory Visit (INDEPENDENT_AMBULATORY_CARE_PROVIDER_SITE_OTHER): Payer: Medicare Other | Admitting: Pulmonary Disease

## 2018-05-03 ENCOUNTER — Encounter: Payer: Self-pay | Admitting: Pulmonary Disease

## 2018-05-03 VITALS — BP 142/82 | HR 66 | Ht 71.0 in | Wt 171.8 lb

## 2018-05-03 DIAGNOSIS — R0683 Snoring: Secondary | ICD-10-CM | POA: Diagnosis not present

## 2018-05-03 DIAGNOSIS — G4719 Other hypersomnia: Secondary | ICD-10-CM | POA: Diagnosis not present

## 2018-05-03 DIAGNOSIS — I6522 Occlusion and stenosis of left carotid artery: Secondary | ICD-10-CM

## 2018-05-03 DIAGNOSIS — Z87891 Personal history of nicotine dependence: Secondary | ICD-10-CM | POA: Diagnosis not present

## 2018-05-03 DIAGNOSIS — I272 Pulmonary hypertension, unspecified: Secondary | ICD-10-CM | POA: Diagnosis not present

## 2018-05-03 NOTE — Progress Notes (Signed)
PULMONARY CONSULT NOTE  Requesting MD/Service: Arida/Dunn (cardiiology), Primary MD: Rita Ohara, MD Date of initial consultation: 05/03/18 Reason for consultation: Pulmonary hypertension  PT PROFILE: 84 y.o. male former smoker referred for evaluation of an incidental finding of pulmonary hypertension on echocardiogram.  Wife reports snoring and witnessed apneas.  Concern for possible obstructive sleep apnea  DATA: Echocardiogram 04/05/18: LVEF 55-60%.  Mild to moderate aortic stenosis.  Left atrium mildly dilated.  RV systolic function normal.  RVSP estimate 56 mmHg.  INTERVAL:  HPI:  As above.  He reports frequent nocturnal awakenings which she attributes to the need for urination.  Occurs 8-9 times per night.  Therefore, his sleep is not restful.  He does have daytime hypersomnolence.  He denies morning headache.  His wife reports that he snores heavily.  On occasion, she has witnessed apneas.  The diagnosis of pulmonary hypertension is new based on the most recent echocardiogram.  Echocardiogram from 07/26/2016 revealed RV systolic pressure estimated at 35 mmHg.  Past Medical History:  Diagnosis Date  . Acute CVA (cerebrovascular accident) ([place])   . Acute encephalopathy 11/27/2014  . Acute on chronic combined systolic and diastolic CHF (congestive heart failure) ([place])   . Aortic atherosclerosis ([place]) 11/25/2014  . Atrial fibrillation ([place])   . Diabetes mellitus   . Heart murmur    mild aortic stenosis  . Hypercholesteremia   . Hypertension   . Impaired fasting glucose   . Macular degeneration   . Multiple lacunar infarcts ([place])   . Prostate cancer ([place]) radiation + seeding implant (Dr.[name])  . 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 Hilarie Fredrickson)  . RMSF [facility] spotted fever) 10/2014   (hosp with FUO, encephalopathy)  . Rotator cuff tear, right 10/2010   supraspinatous and infraspinatous  . TIA (transient ischemic attack) 10/02/13    hosp at [facility] x 1 night  . 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 CATARACT EXTRACTION Right 01/2017  . CIRCUMCISION  01/2017   [name]  . COLONOSCOPY N/A 11/28/2012   Procedure: COLONOSCOPY;  Surgeon: Jerene Bears, MD;  Location: [facility] ENDOSCOPY;  Service: Gastroenterology;  Laterality: N/A;  . INTERNAL URETHROTOMY  01/27/2016   WF  . prostate seed implant    . ROTATOR CUFF REPAIR  06/16/2011   right (Dr. Berenice Primas)  . TONSILLECTOMY AND ADENOIDECTOMY  age 89    MEDICATIONS: I have reviewed all medications and confirmed regimen as documented  Social History   Socioeconomic History  . Marital status: Married    Spouse name: Not on file  . Number of children: 4  . Years of education: Not on file  . Highest education level: Not on file  Occupational History  . Occupation: Retired  Scientific laboratory technician  . Financial resource strain: Not on file  . Food insecurity:    Worry: Not on file    Inability: Not on file  . Transportation needs:    Medical: Not on file    Non-medical: Not on file  Tobacco Use  . Smoking status: Former Smoker    Packs/day: 1.00    Years: 20.00    Pack years: 20.00    Types: Cigarettes    Last attempt to quit: 05/31/1980    Years since quitting: 37.9  . Smokeless tobacco: Never Used  Substance and Sexual Activity  . Alcohol use: Yes    Alcohol/week: 14.0 standard  drinks    Types: 14 Cans of beer per week    Comment: 2 cans of beer most days of the week  . Drug use: No  . Sexual activity: Not Currently    Partners: Female    Comment: issues with ED  Lifestyle  . Physical activity:    Days per week: Not on file    Minutes per session: Not on file  . Stress: Not on file  Relationships  . Social connections:    Talks on phone: Not on file    Gets together: Not on file    Attends religious service: Not on file    Active member of club or organization: Not on file     Attends meetings of clubs or organizations: Not on file    Relationship status: Not on file  . Intimate partner violence:    Fear of current or ex partner: Not on file    Emotionally abused: Not on file    Physically abused: Not on file    Forced sexual activity: Not on file  Other Topics Concern  . Not on file  Social History Narrative   Married. Retired Engineer, structural.  2 sons, 2 daughters (all in [place]), 15 grandchildren, 7 great grandchild, 2 more expected    Family History  Problem Relation Age of Onset  . Heart disease Father 14       Died suddenly.  No diagnosis  . Stroke Sister   . Dementia Mother   . Diabetes Mother   . Heart disease Mother   . Hypertension Mother   . Cancer Daughter        ovarian (?) vs other male cancer; s/p hyst doing well  . Diabetes Daughter        GDM, and now AODM  . Heart disease Son        congestive heart failure--improved    ROS: No fever, myalgias/arthralgias, unexplained weight loss or weight gain No new focal weakness or sensory deficits No otalgia, hearing loss, visual changes, nasal and sinus symptoms, mouth and throat problems No neck pain or adenopathy No abdominal pain, N/V/D, diarrhea, change in bowel pattern No dysuria, change in urinary pattern   Vitals:   05/03/18 1029 05/03/18 1037  BP:  (!) 142/82  Pulse:  66  SpO2:  99%  Weight: 171 lb 12.8 oz (77.9 kg)   Height: 5\' 11"  (1.803 m)      EXAM:  Gen: WDWN, No overt respiratory distress HEENT: NCAT, sclera white, oropharynx normal Neck: Supple without LAN, thyromegaly, JVD Lungs: breath sounds full, percussion normal, adventitious sounds: None Cardiovascular: RRR, no murmurs noted Abdomen: Soft, nontender, normal BS Ext: without clubbing, cyanosis, edema Neuro: CNs grossly intact, motor and sensory intact Skin: Limited exam, no lesions noted  DATA:   BMP Latest Ref Rng & Units 04/28/2018 11/21/2017 05/27/2017  Glucose 70 - 99 mg/dL 144(H) 102(H) 123(H)   BUN 8 - 23 mg/dL 25(H) 15 17  Creatinine 0.61 - 1.24 mg/dL 0.97 0.85 0.78  BUN/Creat Ratio 10 - 24 - 18 NOT APPLICABLE  Sodium 096 - 145 mmol/L 141 142 142  Potassium 3.5 - 5.1 mmol/L 4.3 4.8 4.6  Chloride 98 - 111 mmol/L 106 101 103  CO2 22 - 32 mmol/L 28 26 29   Calcium 8.9 - 10.3 mg/dL 8.8(L) 9.8 9.5    CBC Latest Ref Rng & Units 04/28/2018 11/21/2017 05/27/2017  WBC 4.0 - 10.5 K/uL 7.6 7.8 7.2  Hemoglobin 13.0 - 17.0  g/dL 11.4(L) 14.0 13.4  Hematocrit 39.0 - 52.0 % 35.9(L) 43.0 39.8  Platelets 150 - 400 K/uL 262 315 292    CXR: No recent film  I have personally reviewed all chest radiographs reported above including CXRs and CT chest unless otherwise indicated  IMPRESSION:     ICD-10-CM   1. Pulmonary hypertension (HCC) I27.20 Pulmonary Function Test [facility] Only    DG Chest 2 View  2. Daytime hypersomnolence G47.19 Home sleep test  3. Snoring R06.83   4. Former smoker Z87.891    I suspect pulmonary hypertension is in part due to left-sided failure (noting dilated LA which suggests elevated LVEDP).  However, his history also suggest the possibility of obstructive sleep apnea which might be a contributing factor.  He is a former smoker of approximately 30 pack years but quit many years ago and is asymptomatic from a pulmonary point of view.  Therefore it is unlikely that he has significant underlying lung disease.  PLAN:  The following studies have been ordered to evaluate pulmonary hypertension, daytime sleepiness, snoring: 1) PFTs (lung function test) 2) chest x-ray (to be done at time of follow-up) 3) Home sleep study  Follow-up in 6 to 8 weeks after the above studies have been completed   Merton Border, MD PCCM service Mobile [phone] Pager [phone] 05/08/2018 1:02 PM

## 2018-05-03 NOTE — Patient Instructions (Signed)
The following studies have been ordered to evaluate pulmonary hypertension, daytime sleepiness, snoring: 1) PFTs (lung function test) 2) chest x-ray 3) Home sleep study  Follow-up in 6 to 8 weeks after the above studies have been completed

## 2018-05-11 DIAGNOSIS — H353123 Nonexudative age-related macular degeneration, left eye, advanced atrophic without subfoveal involvement: Secondary | ICD-10-CM | POA: Diagnosis not present

## 2018-05-11 DIAGNOSIS — H353211 Exudative age-related macular degeneration, right eye, with active choroidal neovascularization: Secondary | ICD-10-CM | POA: Diagnosis not present

## 2018-05-16 ENCOUNTER — Other Ambulatory Visit: Payer: Self-pay | Admitting: Cardiovascular Disease

## 2018-05-16 NOTE — Telephone Encounter (Signed)
Please review for refill. Thanks!  

## 2018-05-16 NOTE — Telephone Encounter (Signed)
Please review for refills Patient almost out of medication

## 2018-05-16 NOTE — Telephone Encounter (Signed)
Pt last saw Christell Faith, PA on 04/19/18, last labs 04/28/18 Creat 0.97, age 84, weight 77.9kg, based on specified criteria pt is on appropriate dosage of Eliquis 5mg  BID.  Will refill rx.

## 2018-05-25 ENCOUNTER — Telehealth: Payer: Self-pay | Admitting: Pulmonary Disease

## 2018-05-25 ENCOUNTER — Ambulatory Visit
Admission: RE | Admit: 2018-05-25 | Discharge: 2018-05-25 | Disposition: A | Discharge status: Home / Self Care | Payer: Medicare Other | Source: Ambulatory Visit | Attending: Neurology | Admitting: Neurology

## 2018-05-25 ENCOUNTER — Ambulatory Visit: Admission: RE | Admit: 2018-05-25 | Payer: Medicare Other | Source: Ambulatory Visit

## 2018-05-25 DIAGNOSIS — G40909 Epilepsy, unspecified, not intractable, without status epilepticus: Secondary | ICD-10-CM | POA: Insufficient documentation

## 2018-05-25 DIAGNOSIS — R251 Tremor, unspecified: Secondary | ICD-10-CM | POA: Insufficient documentation

## 2018-05-25 NOTE — Telephone Encounter (Signed)
Called and spoke with pt's wife who stated that patient had a conflict with the time to pick up the HST device.  Changed pick up time for HST to 11:00 am on 05/26/18. Nothing else needed at this time. Rhonda J Cobb

## 2018-05-26 ENCOUNTER — Ambulatory Visit: Payer: Medicare Other

## 2018-05-26 DIAGNOSIS — G4733 Obstructive sleep apnea (adult) (pediatric): Secondary | ICD-10-CM | POA: Diagnosis not present

## 2018-06-07 ENCOUNTER — Ambulatory Visit (INDEPENDENT_AMBULATORY_CARE_PROVIDER_SITE_OTHER): Payer: Medicare Other

## 2018-06-07 DIAGNOSIS — G4733 Obstructive sleep apnea (adult) (pediatric): Secondary | ICD-10-CM | POA: Diagnosis not present

## 2018-06-07 DIAGNOSIS — G40909 Epilepsy, unspecified, not intractable, without status epilepticus: Secondary | ICD-10-CM

## 2018-06-08 ENCOUNTER — Ambulatory Visit
Admission: RE | Admit: 2018-06-08 | Discharge: 2018-06-08 | Disposition: A | Discharge status: Home / Self Care | Payer: Medicare Other | Source: Ambulatory Visit | Attending: Pulmonary Disease | Admitting: Pulmonary Disease

## 2018-06-08 ENCOUNTER — Ambulatory Visit: Payer: Medicare Other | Admitting: Family Medicine

## 2018-06-08 ENCOUNTER — Ambulatory Visit (HOSPITAL_COMMUNITY): Payer: Medicare Other

## 2018-06-08 DIAGNOSIS — I272 Pulmonary hypertension, unspecified: Secondary | ICD-10-CM | POA: Diagnosis not present

## 2018-06-08 MED ORDER — ALBUTEROL SULFATE (2.5 MG/3ML) 0.083% IN NEBU
2.5000 mg | INHALATION_SOLUTION | Freq: Once | RESPIRATORY_TRACT | Status: AC
  Administered 2018-06-08: 2.5 mg via RESPIRATORY_TRACT
  Filled 2018-06-08: qty 3

## 2018-06-09 ENCOUNTER — Telehealth: Payer: Self-pay

## 2018-06-09 DIAGNOSIS — G4719 Other hypersomnia: Secondary | ICD-10-CM

## 2018-06-09 DIAGNOSIS — G4733 Obstructive sleep apnea (adult) (pediatric): Secondary | ICD-10-CM

## 2018-06-09 NOTE — Telephone Encounter (Signed)
Attempted to call with sleep study results. No answer, no voicemail at primary #. Left VM on secondary number.     Moderate OSA with AHI of 29, severe in the supine posiiton.  Recommend auto-CPAP with pressure range of 5-20 cm H2O

## 2018-06-12 ENCOUNTER — Encounter: Payer: Self-pay | Admitting: Pulmonary Disease

## 2018-06-12 ENCOUNTER — Ambulatory Visit (INDEPENDENT_AMBULATORY_CARE_PROVIDER_SITE_OTHER): Payer: Medicare Other | Admitting: Pulmonary Disease

## 2018-06-12 VITALS — BP 160/84 | HR 62 | Ht 71.0 in | Wt 173.0 lb

## 2018-06-12 DIAGNOSIS — I272 Pulmonary hypertension, unspecified: Secondary | ICD-10-CM

## 2018-06-12 DIAGNOSIS — G4733 Obstructive sleep apnea (adult) (pediatric): Secondary | ICD-10-CM | POA: Diagnosis not present

## 2018-06-12 DIAGNOSIS — R942 Abnormal results of pulmonary function studies: Secondary | ICD-10-CM

## 2018-06-12 NOTE — Patient Instructions (Signed)
We will initiate therapy for moderate sleep apnea with CPAP  We will monitor your lung function and the blood pressure in your lungs with pulmonary function tests and echocardiogram annually for the next couple of years  Follow-up in 6 weeks.  Call sooner if needed

## 2018-06-12 NOTE — Telephone Encounter (Signed)
Patient was seen in office, Dr. Alva Garnet gave results. Orders placed.

## 2018-06-12 NOTE — Progress Notes (Signed)
PULMONARY OFFICE FOLLOW UP NOTE  Requesting MD/Service: Arida/Dunn (cardiiology), Primary MD: Rita Ohara, MD Date of initial consultation: 05/03/18 Reason for consultation: Pulmonary hypertension  PT PROFILE: 85 y.o. male former smoker referred for evaluation of an incidental finding of pulmonary hypertension on echocardiogram.  Wife reports snoring and witnessed apneas.  Concern for possible obstructive sleep apnea  DATA: Echocardiogram 04/05/18: LVEF 55-60%.  Mild to moderate aortic stenosis.  Left atrium mildly dilated.  RV systolic function normal.  RVSP estimate 56 mmHg. PSG 05/27/18: AHI 29/hr. Recommended Autoset 5-20 cm H2O PFTs 06/08/18: FVC: 2.64 > 2.72 L (67 > 69 %pred), FEV1: 2.12 > 2.23 L (73 > 77 %pred), FEV1/FVC: 80%, TLC: 4.75 L (65 %pred), DLCO 77 %pred, DLCO/VA 122% pred    INTERVAL: No major events  SUBJ:  This is a scheduled follow-up to review the results of pulmonary function tests and sleep study as documented above.  He has no new complaints.  He denies CP, fever, purulent sputum, hemoptysis, LE edema and calf tenderness.  Vitals:   06/12/18 0818 06/12/18 0826  BP:  (!) 160/84  Pulse:  62  SpO2:  99%  Weight: 173 lb (78.5 kg)   Height: 5\' 11"  (1.803 m)      EXAM:  Gen: NAD HEENT: NCAT, sclera white Neck: No JVD Lungs: breath sounds full, no wheezes or other adventitious sounds Cardiovascular: RRR, no murmurs Abdomen: Soft, nontender, normal BS Ext: without clubbing, cyanosis, edema Neuro: grossly intact Skin: Limited exam, no lesions noted   DATA:   BMP Latest Ref Rng & Units 04/28/2018 11/21/2017 05/27/2017  Glucose 70 - 99 mg/dL 144(H) 102(H) 123(H)  BUN 8 - 23 mg/dL 25(H) 15 17  Creatinine 0.61 - 1.24 mg/dL 0.97 0.85 0.78  BUN/Creat Ratio 10 - 24 - 18 NOT APPLICABLE  Sodium 500 - 145 mmol/L 141 142 142  Potassium 3.5 - 5.1 mmol/L 4.3 4.8 4.6  Chloride 98 - 111 mmol/L 106 101 103  CO2 22 - 32 mmol/L 28 26 29   Calcium 8.9 - 10.3 mg/dL 8.8(L)  9.8 9.5    CBC Latest Ref Rng & Units 04/28/2018 11/21/2017 05/27/2017  WBC 4.0 - 10.5 K/uL 7.6 7.8 7.2  Hemoglobin 13.0 - 17.0 g/dL 11.4(L) 14.0 13.4  Hematocrit 39.0 - 52.0 % 35.9(L) 43.0 39.8  Platelets 150 - 400 K/uL 262 315 292    CXR 01/09: No acute cardiac or pulmonary findings  I have personally reviewed all chest radiographs reported above including CXRs and CT chest unless otherwise indicated  IMPRESSION:     ICD-10-CM   1. Moderate OSA G47.33   2. Pulmonary hypertension, group 5 I27.20   3. Restrictive pattern present on pulmonary function testing R94.2    Sleep study reveals moderate sleep apnea.  This is probably a contributor to but not the full explanation for pulmonary hypertension.  He also has valvular heart disease which might be a contributor as well.  The restrictive pattern on PFTs is not well explained and does not appear to have a radiographic correlate (lung volumes appear full on CXR and there is no evidence of interstitial lung disease)  PLAN:  We will initiate therapy for moderate sleep apnea with CPAP  We should monitor pulmonary hypertension and restrictive physiology pattern with annual echocardiogram and PFTs.  No further evaluation of these problems is warranted at the present time  Follow-up in 6 weeks to assess response to CPAP.  Call sooner if needed   Merton Border, MD PCCM service  Mobile [phone] Pager [phone] 06/12/2018 2:36 PM

## 2018-06-14 ENCOUNTER — Ambulatory Visit
Admission: RE | Admit: 2018-06-14 | Discharge: 2018-06-14 | Disposition: A | Discharge status: Home / Self Care | Payer: Medicare Other | Source: Ambulatory Visit | Attending: Neurology | Admitting: Neurology

## 2018-06-14 ENCOUNTER — Telehealth: Payer: Self-pay | Admitting: Neurology

## 2018-06-14 DIAGNOSIS — R569 Unspecified convulsions: Secondary | ICD-10-CM | POA: Diagnosis not present

## 2018-06-14 DIAGNOSIS — R251 Tremor, unspecified: Secondary | ICD-10-CM | POA: Diagnosis not present

## 2018-06-14 DIAGNOSIS — G40909 Epilepsy, unspecified, not intractable, without status epilepticus: Secondary | ICD-10-CM | POA: Diagnosis not present

## 2018-06-14 LAB — POCT I-STAT CREATININE: CREATININE: 0.9 mg/dL (ref 0.61–1.24)

## 2018-06-14 MED ORDER — GADOBUTROL 1 MMOL/ML IV SOLN
7.0000 mL | Freq: Once | INTRAVENOUS | Status: AC | PRN
  Administered 2018-06-14: 7 mL via INTRAVENOUS

## 2018-06-14 NOTE — Telephone Encounter (Signed)
I was called by [facility] imaging to look at patient's MRI scan of the brain which was done today which showed tiny punctate right temporal subcortical infarct.  I called the patient and spoke to him.  He is clinically doing fine without any signs of an acute stroke.  He is on Eliquis for atrial fibrillation.  I discussed with him alternative medications but felt there is no strong evidence to suggest switching from Eliquis to Pradaxa or Xarelto is any superior and so we decided to stay on Eliquis.  He is tolerating Keppra well and has not had any further episodes of seizure-like spells.  He was advised to drive but to be careful.  He was advised to make an appointment for follow-up to see me in March 2020.  He voiced understanding.

## 2018-06-15 ENCOUNTER — Other Ambulatory Visit: Payer: Self-pay | Admitting: Family Medicine

## 2018-06-15 DIAGNOSIS — E7800 Pure hypercholesterolemia, unspecified: Secondary | ICD-10-CM

## 2018-06-15 DIAGNOSIS — H353123 Nonexudative age-related macular degeneration, left eye, advanced atrophic without subfoveal involvement: Secondary | ICD-10-CM | POA: Diagnosis not present

## 2018-06-15 DIAGNOSIS — H353211 Exudative age-related macular degeneration, right eye, with active choroidal neovascularization: Secondary | ICD-10-CM | POA: Diagnosis not present

## 2018-06-27 ENCOUNTER — Encounter: Payer: Self-pay | Admitting: Family Medicine

## 2018-06-27 DIAGNOSIS — I272 Pulmonary hypertension, unspecified: Secondary | ICD-10-CM | POA: Insufficient documentation

## 2018-06-27 DIAGNOSIS — G4733 Obstructive sleep apnea (adult) (pediatric): Secondary | ICD-10-CM

## 2018-06-27 HISTORY — DX: Obstructive sleep apnea (adult) (pediatric): G47.33

## 2018-06-27 NOTE — Progress Notes (Signed)
Chief Complaint  Patient presents with  . Medicare Wellness    AWV (patient did not fast today). Having some pain in left hip/leg-has been using a cane.     Jeffrey [name] is a 85 y.o. male who presents for annual wellness visit and follow-up on chronic medical conditions.  He has the following concerns:  Left hip/leg pain--started around the time he stated taking Keppra (though he believes it is likely not related).  No injury, fall or change in actiivty. He has been walking with a cane for 6 weeks, and reports the pain has eased off some.  Pain is lateral, sometimes it goes down to the knee.  Denies back pain (he did for a bit, resolved). No numbness, tingling, weakness.   Hypertension:  He continues on lisinopril, carvedilol and amlodipine per [name].  He has a significant white coat component to his blood pressures, but monitors them regularly at home. BP's have been running130's/80-85 in the morning, lower mid-day, around 619 systolic, and 509/32 at night.  He denies headaches, lightheadedness,chest pain, palpitations, shortness of breath, edema. Reports compliance with his medications.  Paroxysmal atrial fibrillation--Cardiologist recommends lifelong anticoagulation. Patient denies palpitations, tachycardia, denies any significant bleeding/bruising or side effects from medication. Last echo was 03/2018, normal EF, mild-mod aortic stenosis, pulmonary hypertension noted.  Pulmonary hypertension:  He was found to have mod sleep apnea on recent sleep study, and was started on CPAP. He has been using CPAP for about 2 weeks.  He wakes up with dry mouth, otherwise tolerating it fine.  Hasn't noticed a lot of difference in how he feels yet. He notes decreased nocturia since using CPAP--only getting up once in the middle of the night (then goes every 15 minutes in the early morning when he starts to wake up--no longer interferes with his sleep).   DM/IFG--He doesn't check his sugars. He goes  to the eye doctor regularly, for macular degeneration, last visit in 04/2018 ([name]).He denies any feet concerns, numbness/tingling.Skin "feels loose" on his toes.  He has urinary frequency x several years (related to prostate), no change; denies polydipsia. Lab Results  Component Value Date   HGBA1C 6.1 (A) 11/21/2017   Hyperlipidemia: Compliant with medications; denies side effects. Tries to follow a lowfat, low cholesterol diet, cut back on fried foods. He is due for repeat lipids. He is not fasting today Lab Results  Component Value Date   CHOL 174 05/27/2017   HDL 75 05/27/2017   LDLCALC 78 05/27/2017   TRIG 124 05/27/2017   CHOLHDL 2.3 05/27/2017   H/o stroke in 10/2014(right frontal white matter infarct in June 2016 secondary to small vessel disease). He has asymptomatic L carotid artery stenosis, and is monitored by [name] for this.Last ultrasound was 11/2017: Right Carotid: Velocities in the right ICA are consistent with a 1-39% stenosis. Left Carotid: Velocities in the left ICA are consistent with a 40-59% stenosis. Calcified plaque may obscure high velocities. Vertebrals:  Bilateral vertebral arteries demonstrate antegrade flow. Subclavians: Normal flow hemodynamics were seen in bilateral subclavian arteries.  He saw [name] in 04/2018 after ER visit for alteration in mental status, suspecting complex partial seizures. He was started on Keppra XR 560m qd, and had EEG and MRI.  EEG was normal.  MRI 06/14/2018: IMPRESSION: Small area of restricted diffusion in the right posterior temporal lobe most consistent with small acute or subacute infarct Moderate atrophy and moderate chronic microvascular ischemic change. Chronic microhemorrhage left occipital lobe.  He reports  being told that he didn't think the MRI findings connected to the current problem, possible old stroke (pt's reports of what he was told by neuro of MRI results). Since being on the [place], he  hasn't had any further staring spells where he doesn't respond. Denies side effects to Keppra that he can tell.  Prostate cancer: Monitored by [name] every 6 months. He underwent internal urethrotomy, cystourethroscopy and circumcision on 02/15/17. He reports not really noticing improvement since treatment of the stricture, continues with frequency and leakage. Last PSA was <0.01 in  September, 2019. He seems somewhat frustrated that [name] hasn't really addressed his urinary complaints (other than the procedure which wasn't effective; not given any medicine trials, etc).   Immunization History  Administered Date(s) Administered  . Influenza Split 01/30/2012, 02/03/2014, 03/25/2015  . Influenza, High Dose Seasonal PF 03/17/2016, 03/04/2017, 02/22/2018  . Influenza-Unspecified 01/29/2013, 03/25/2015  . Pneumococcal Conjugate-13 10/17/2014  . Pneumococcal Polysaccharide-23 12/17/2004, 10/02/2012  . Td 10/29/2004   Tdap recommended the last 3 years (needs to get from pharmacy)--DID HE GET? Last colonoscopy: 11/2012 with Dr. Hilarie Fredrickson (radiation proctitis)  Last PSA: per urologist, every 6 months (last in 01/2018, <0.01) Dentist:twice yearly Ophtho: twice yearly (currently every 6 weeks, getting injections in R eye) Exercise: Walks the dogs usually 2x/day (less--now takes turns with his wife), much less due to his recent hip pain.  Other doctors caring for patient include:  GI--Dr. Hilarie Fredrickson  Urologist: [name]  Dentist: Dr.Taloopis Ophtho: [name] (at [facility] Dr. Katherene Ponto Cardiologist: [name] Ortho: Dr. Berenice Primas (no longer under his care; problem resolved)  Neuro: [name] Pulmonary: Dr. Alva Garnet Vascular: Dr. Roseanna Rainbow:  Can't recall the name, saw someone in [place]   Depression screen: negative Fall screen: none in the last year Functional status survey--notable for decreased hearing and vision(macular degeneration; leakage of urine, urge incontinence (wears  Depends); Some memory issues (forgetting names of people he hasn't seen in a long time, no other memory issues). Some limping with walking due to L leg pain Mini-Cog screen: normal See full screens in Epic  End of Life Discussion: He has been given forms in the past, but still hasn't done, reports being a procrastinator.  Given another set and counseled  Past Medical History:  Diagnosis Date  . Acute CVA (cerebrovascular accident) ([place])   . Acute encephalopathy 11/27/2014  . Acute on chronic combined systolic and diastolic CHF (congestive heart failure) ([place])   . Aortic atherosclerosis ([place]) 11/25/2014  . Atrial fibrillation ([place])   . Diabetes mellitus   . Heart murmur    mild aortic stenosis  . Hypercholesteremia   . Hypertension   . Impaired fasting glucose   . Macular degeneration   . Multiple lacunar infarcts ([place])   . Obstructive sleep apnea 06/27/2018  . Prostate cancer ([place]) radiation + seeding implant (Dr.[name])  . 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 Hilarie Fredrickson)  . RMSF [facility] spotted fever) 10/2014   (hosp with FUO, encephalopathy)  . Rotator cuff tear, right 10/2010   supraspinatous and infraspinatous  . TIA (transient ischemic attack) 10/02/13   hosp at [facility] x 1 night  . 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 CATARACT EXTRACTION Right 01/2017  . CIRCUMCISION  01/2017   [name]  . COLONOSCOPY N/A 11/28/2012   Procedure: COLONOSCOPY;  Surgeon: Jerene Bears, MD;  Location:  [facility] ENDOSCOPY;  Service: Gastroenterology;  Laterality: N/A;  . INTERNAL URETHROTOMY  01/27/2016   WF  . prostate seed implant    . ROTATOR CUFF REPAIR  06/16/2011   right (Dr. Berenice Primas)  . TONSILLECTOMY AND ADENOIDECTOMY  age 73    Social History   Socioeconomic History  . Marital status: Married    Spouse name: Not on file  . Number of children: 4   . Years of education: Not on file  . Highest education level: Not on file  Occupational History  . Occupation: Retired  Scientific laboratory technician  . Financial resource strain: Not on file  . Food insecurity:    Worry: Not on file    Inability: Not on file  . Transportation needs:    Medical: Not on file    Non-medical: Not on file  Tobacco Use  . Smoking status: Former Smoker    Packs/day: 1.00    Years: 20.00    Pack years: 20.00    Types: Cigarettes    Last attempt to quit: 05/31/1980    Years since quitting: 38.1  . Smokeless tobacco: Never Used  Substance and Sexual Activity  . Alcohol use: Yes    Alcohol/week: 14.0 standard drinks    Types: 14 Cans of beer per week    Comment: 2 cans of beer most days of the week  . Drug use: No  . Sexual activity: Not Currently    Partners: Female    Comment: issues with ED  Lifestyle  . Physical activity:    Days per week: Not on file    Minutes per session: Not on file  . Stress: Not on file  Relationships  . Social connections:    Talks on phone: Not on file    Gets together: Not on file    Attends religious service: Not on file    Active member of club or organization: Not on file    Attends meetings of clubs or organizations: Not on file    Relationship status: Not on file  . Intimate partner violence:    Fear of current or ex partner: Not on file    Emotionally abused: Not on file    Physically abused: Not on file    Forced sexual activity: Not on file  Other Topics Concern  . Not on file  Social History Narrative   Married. Retired Engineer, structural.  2 sons, 2 daughters (all in [place]), 15 grandchildren, 7 great grandchild, 2 more expected    Family History  Problem Relation Age of Onset  . Heart disease Father 49       Died suddenly.  No diagnosis  . Stroke Sister   . Dementia Mother   . Diabetes Mother   . Heart disease Mother   . Hypertension Mother   . Cancer Daughter        ovarian (?) vs other male cancer; s/p hyst  doing well  . Diabetes Daughter        GDM, and now AODM  . Heart disease Son        congestive heart failure--improved  . Congestive Heart Failure Grandchild     Outpatient Encounter Medications as of 06/28/2018  Medication Sig Note  . acetaminophen (TYLENOL ARTHRITIS PAIN) 650 MG CR tablet Take 1 tablet (650 mg total) by mouth every 8 (eight) hours as needed. 04/28/2015: 2 qam and 2 qpm  . amLODipine (NORVASC) 5 MG tablet Take 1 tablet (5 mg total) by mouth daily.   [name]  carvedilol (COREG) 6.25 MG tablet Take 6.25 mg, one tablet, in the morning and 2 tablets (12.5 mg) in the evening.   [name] ELIQUIS 5 MG TABS tablet TAKE ONE TABLET BY MOUTH TWICE DAILY   . levETIRAcetam (KEPPRA XR) 500 MG 24 hr tablet Take 1 tablet (500 mg total) by mouth daily.   [name] lisinopril (PRINIVIL,ZESTRIL) 40 MG tablet Take 1 tablet (40 mg total) by mouth daily.   [name] lovastatin (MEVACOR) 40 MG tablet TAKE TWO TABLETS BY MOUTH AT BEDTIME   . Multiple Vitamins-Minerals (PRESERVISION AREDS) CAPS Take 1 capsule by mouth 2 (two) times daily.    [name] UNABLE TO FIND Bevacizumab(AVASTIN) chemo injection for the eye every 6 weeks   . betamethasone dipropionate (DIPROLENE) 0.05 % ointment Apply topically 2 (two) times daily.    No facility-administered encounter medications on file as of 06/28/2018.     No Known Allergies   ROS: The patient denies anorexia, fever, weight changes, headaches, ear pain, hoarseness, chest pain, palpitations, dyspnea on exertion, cough, swelling, nausea, vomiting, diarrhea, constipation, abdominal pain, melena, hematochezia, indigestion/heartburn, dysuria, genital lesions, numbness, tingling, weakness, suspicious skin lesions, depression, anxiety, abnormal bleeding/bruising, or enlarged lymph nodes.  +bilateral hearing loss, L>R  Some chronic runny nose. Vision loss--worse at night, doesn't drive at night. Known macular degeneration. Has some urge incontinence, wears Depends. Still has urinary  frequency, especially during the mornings. +ED No bleeding, bruising. No further episodes staring spells since on Keppra Left hip/leg pain per HPI.    PHYSICAL EXAM:  BP (!) 180/90   Pulse 72   Ht '5\' 10"'  (1.778 m)   Wt 172 lb 9.6 oz (78.3 kg)   BMI 24.77 kg/m    Wt Readings from Last 3 Encounters:  06/28/18 172 lb 9.6 oz (78.3 kg)  06/12/18 173 lb (78.5 kg)  05/03/18 171 lb 12.8 oz (77.9 kg)    General Appearance:  Alert, cooperative, no distress, appears stated age  Head:  Normocephalic, without obvious abnormality, atraumatic   Eyes:  PERRL, conjunctiva/corneas clear, EOM's intact. No cataracts (s/p surgery)  Ears:  Normal TM's and external ear canals   Nose:  Nares normal, mucosa normal, no drainage or sinus tenderness   Throat:  Lips, mucosa, and tongue normal; gums normal.   Neck:  Supple, no lymphadenopathy; thyroid: no enlargement/tenderness/nodules; bruit noted on left.  Back:  Spine nontender, no curvature, ROM normal, no CVA tenderness   Lungs:  Clear to auscultation bilaterally without wheezes, rales or ronchi; respirations unlabored   Chest Wall:  No tenderness or deformity   Heart:  Regular rate and rhythm; S1 and S2 normal, no rub or gallop. 3/6 SEM heard throughout, loudest at RUSB.  Breast Exam:  No chest wall tenderness, masses or gynecomastia   Abdomen:  Soft, non-tender, nondistended, normoactive bowel sounds, no masses, no hepatosplenomegaly. Central abdominal obesity  Genitalia:  Deferred to urologist   Rectal:  Deferred to urologist  Extremities:  No clubbing, cyanosis or edema. FROM both hips, slightly stiffer on the left, but not painful per pt.  Some discomfort with IT stretch. nontender over trochanter   Pulses:  2+ and symmetric all extremities   Skin:  Skin color, texture, turgor normal, no rashes or lesions; Large SK on right face, less pigmented than in the past  Lymph nodes:  Cervical, supraclavicular, and  axillary nodes normal   Neurologic:  CNII-XII intact, normal strength, sensation and gait; reflexes 2+ and symmetric throughout.    Psych: Normal mood, affect, hygiene  and grooming.   Diabetic foot exam performed--some discoloration and thickening of the nails. Left second toemail--very long, and wrapped around to the bottom of the toe.  Normal monofilament exam  Lab Results  Component Value Date   HGBA1C 6.0 (A) 06/28/2018     ASSESSMENT/PLAN:    c-met, cbc, lipids, urine microalbumin and TSH--to return for fasting labs  Asked about having PSA's drawn here instead of seeing Dr. Seleta Rhymes he needs to remain under his care.  Also, needs to discuss his urinary complaints and see if there are meds that might help him. (discussed poss having his mid-year labs checked here, and seeing him once yearly, if [name] is agreeable, but did NOT want him to longer be seen by urologist).   Left hip xray IT band stretches shown  Recommended at least 30 minutes of aerobic activity at least 5 days/week, weight-bearing exercise 2x/wk; proper sunscreen use reviewed; healthy diet and alcohol recommendations (1 drink/day or less preferred, occasionally 2) reviewed; regular seatbelt use; changing batteries in smoke detectors. Immunization recommendations --past due forTdap, advised of this x 3 years.  Rx for Tdap again given, and also recommended Shingrix, risks/side effects reviewed.Advised that if he doesn't have Part D coverage, I recommend him paying for TdaP. Can check pricing of Shingrix at [facility]. Colonoscopy recommendations reviewed--UTD.  Full Code, Full Care MOST form completed. Discussed living will and healthcare POA at length, and given new forms.  F/u 6 mos    Medicare Attestation I have personally reviewed: The patient's medical and social history Their use of alcohol, tobacco or illicit drugs Their current medications and supplements The  patient's functional ability including ADLs,fall risks, home safety risks, cognitive, and hearing and visual impairment Diet and physical activities Evidence for depression or mood disorders  The patient's weight, height and  BMI have been recorded in the chart.  I have made referrals, counseling, and provided education to the patient based on review of the above and I have provided the patient with a written personalized care plan for preventive services.

## 2018-06-28 ENCOUNTER — Ambulatory Visit (INDEPENDENT_AMBULATORY_CARE_PROVIDER_SITE_OTHER): Payer: Medicare Other | Admitting: Family Medicine

## 2018-06-28 ENCOUNTER — Encounter: Payer: Self-pay | Admitting: Family Medicine

## 2018-06-28 VITALS — BP 180/90 | HR 72 | Ht 70.0 in | Wt 172.6 lb

## 2018-06-28 DIAGNOSIS — Z0000 Encounter for general adult medical examination without abnormal findings: Secondary | ICD-10-CM | POA: Diagnosis not present

## 2018-06-28 DIAGNOSIS — I480 Paroxysmal atrial fibrillation: Secondary | ICD-10-CM | POA: Diagnosis not present

## 2018-06-28 DIAGNOSIS — E1129 Type 2 diabetes mellitus with other diabetic kidney complication: Secondary | ICD-10-CM | POA: Diagnosis not present

## 2018-06-28 DIAGNOSIS — E7800 Pure hypercholesterolemia, unspecified: Secondary | ICD-10-CM | POA: Diagnosis not present

## 2018-06-28 DIAGNOSIS — R809 Proteinuria, unspecified: Secondary | ICD-10-CM

## 2018-06-28 DIAGNOSIS — I6522 Occlusion and stenosis of left carotid artery: Secondary | ICD-10-CM

## 2018-06-28 DIAGNOSIS — Z5181 Encounter for therapeutic drug level monitoring: Secondary | ICD-10-CM | POA: Diagnosis not present

## 2018-06-28 DIAGNOSIS — E1169 Type 2 diabetes mellitus with other specified complication: Secondary | ICD-10-CM

## 2018-06-28 DIAGNOSIS — Z7901 Long term (current) use of anticoagulants: Secondary | ICD-10-CM

## 2018-06-28 DIAGNOSIS — I272 Pulmonary hypertension, unspecified: Secondary | ICD-10-CM

## 2018-06-28 DIAGNOSIS — I10 Essential (primary) hypertension: Secondary | ICD-10-CM

## 2018-06-28 DIAGNOSIS — L602 Onychogryphosis: Secondary | ICD-10-CM

## 2018-06-28 DIAGNOSIS — E785 Hyperlipidemia, unspecified: Secondary | ICD-10-CM

## 2018-06-28 DIAGNOSIS — I152 Hypertension secondary to endocrine disorders: Secondary | ICD-10-CM

## 2018-06-28 DIAGNOSIS — I350 Nonrheumatic aortic (valve) stenosis: Secondary | ICD-10-CM

## 2018-06-28 DIAGNOSIS — M25552 Pain in left hip: Secondary | ICD-10-CM

## 2018-06-28 DIAGNOSIS — I700 Atherosclerosis of aorta: Secondary | ICD-10-CM | POA: Diagnosis not present

## 2018-06-28 DIAGNOSIS — E1159 Type 2 diabetes mellitus with other circulatory complications: Secondary | ICD-10-CM

## 2018-06-28 DIAGNOSIS — R404 Transient alteration of awareness: Secondary | ICD-10-CM

## 2018-06-28 DIAGNOSIS — G4733 Obstructive sleep apnea (adult) (pediatric): Secondary | ICD-10-CM

## 2018-06-28 LAB — POCT GLYCOSYLATED HEMOGLOBIN (HGB A1C): Hemoglobin A1C: 6 % — AB (ref 4.0–5.6)

## 2018-06-28 NOTE — Patient Instructions (Addendum)
HEALTH MAINTENANCE RECOMMENDATIONS:  It is recommended that you get at least 30 minutes of aerobic exercise at least 5 days/week (for weight loss, you may need as much as 60-90 minutes). This can be any activity that gets your heart rate up. This can be divided in 10-15 minute intervals if needed, but try and build up your endurance at least once a week.  Weight bearing exercise is also recommended twice weekly.  Eat a healthy diet with lots of vegetables, fruits and fiber.  "Colorful" foods have a lot of vitamins (ie green vegetables, tomatoes, red peppers, etc).  Limit sweet tea, regular sodas and alcoholic beverages, all of which has a lot of calories and sugar.  Up to 2 alcoholic drinks daily may be beneficial for men (unless trying to lose weight, watch sugars).  Drink a lot of water.  Sunscreen of at least SPF 30 should be used on all sun-exposed parts of the skin when outside between the hours of 10 am and 4 pm (not just when at beach or pool, but even with exercise, golf, tennis, and yard work!)  Use a sunscreen that says "broad spectrum" so it covers both UVA and UVB rays, and make sure to reapply every 1-2 hours.  Remember to change the batteries in your smoke detectors when changing your clock times in the spring and fall.  Use your seat belt every time you are in a car, and please drive safely and not be distracted with cell phones and texting while driving.   Mr. Fosnaugh , Thank you for taking time to come for your Medicare Wellness Visit. I appreciate your ongoing commitment to your health goals. Please review the following plan we discussed and let me know if I can assist you in the future.    This is a list of the screening recommended for you and due dates:  Health Maintenance  Topic Date Due  . Tetanus Vaccine  10/30/2014  . Eye exam for diabetics  08/29/2017  . Hemoglobin A1C  05/23/2018  . Complete foot exam   05/27/2018  . Flu Shot  Completed  . Pneumonia vaccines   Completed   A1c was done today, and is stable; sugars are fine overall.  I know you see the eye doctor very regularly.  We haven't gotten a formal "diabetic eye exam" report from him, which is why it states you are past due.  Next time you see them, ask them to send [place] a report, indicating whether or not there is any diabetic retinopathy.  Please get TdaP and Shingrix vaccines from the pharmacy (they are not covered by your Medicare to get through my office, need to get through Part D from the pharmacy). If you don't have Part D, you may need to pay out of pocket for these vaccines--check prices, [facility] may be the least expensive.   Please be sure to discuss your urinary frequency and complaints with [name], specifically asking if there are any treatments that might help (since what he has tried so far, has not).  Ask about medications.  Go to [facility] ([address]) to get x-rays of your left hip. I suspect that your problem is related to the iliotibial band. Do the stretches shown the office at least twice a day--lay on your right side and extend a straightened left leg over the side of the bed. And bending forward to touch your toes with your legs crossed (in both directions--I think you'll feel the  stretch on the left when the right foot is crossed in front of the left). You may use topical medications such as Biofreeze or Icy hot.  You can try heat vs ice. I didn't see evidence of bursitis on exam today.  If your pain at the lateral hip (especially when trying to sleep on that side) gets worse, we can consider a steroid injection. Next steps would be either physical therapy and/or seeing an orthopedist.   I need you to go back to the podiatrist to get your nails clipped.

## 2018-06-29 LAB — MICROALBUMIN / CREATININE URINE RATIO
Creatinine, Urine: 72 mg/dL
Microalb/Creat Ratio: 15 mg/g creat (ref 0–29)
Microalbumin, Urine: 10.6 ug/mL

## 2018-06-29 NOTE — Progress Notes (Signed)
I looked in Epic first and nothing on his problem list for them in his chart. I called over there and they did not know and they have never done a diabetic eye exam on him-I did let them know that he is a diabetic(diet controlled).

## 2018-07-05 ENCOUNTER — Other Ambulatory Visit: Payer: Medicare Other

## 2018-07-05 ENCOUNTER — Ambulatory Visit
Admission: RE | Admit: 2018-07-05 | Discharge: 2018-07-05 | Disposition: A | Discharge status: Home / Self Care | Payer: Medicare Other | Source: Ambulatory Visit | Attending: Family Medicine | Admitting: Family Medicine

## 2018-07-05 DIAGNOSIS — E1129 Type 2 diabetes mellitus with other diabetic kidney complication: Secondary | ICD-10-CM | POA: Diagnosis not present

## 2018-07-05 DIAGNOSIS — I10 Essential (primary) hypertension: Secondary | ICD-10-CM

## 2018-07-05 DIAGNOSIS — R809 Proteinuria, unspecified: Principal | ICD-10-CM

## 2018-07-05 DIAGNOSIS — E785 Hyperlipidemia, unspecified: Secondary | ICD-10-CM | POA: Diagnosis not present

## 2018-07-05 DIAGNOSIS — E1169 Type 2 diabetes mellitus with other specified complication: Secondary | ICD-10-CM

## 2018-07-05 DIAGNOSIS — Z7901 Long term (current) use of anticoagulants: Secondary | ICD-10-CM

## 2018-07-05 DIAGNOSIS — E7800 Pure hypercholesterolemia, unspecified: Secondary | ICD-10-CM

## 2018-07-05 DIAGNOSIS — E1159 Type 2 diabetes mellitus with other circulatory complications: Secondary | ICD-10-CM

## 2018-07-05 DIAGNOSIS — I700 Atherosclerosis of aorta: Secondary | ICD-10-CM | POA: Diagnosis not present

## 2018-07-05 DIAGNOSIS — Z5181 Encounter for therapeutic drug level monitoring: Secondary | ICD-10-CM

## 2018-07-05 DIAGNOSIS — M25552 Pain in left hip: Secondary | ICD-10-CM

## 2018-07-05 DIAGNOSIS — I152 Hypertension secondary to endocrine disorders: Secondary | ICD-10-CM

## 2018-07-06 LAB — CBC WITH DIFFERENTIAL/PLATELET
Basophils Absolute: 0.1 10*3/uL (ref 0.0–0.2)
Basos: 1 %
EOS (ABSOLUTE): 0.2 10*3/uL (ref 0.0–0.4)
EOS: 3 %
Hematocrit: 38 % (ref 37.5–51.0)
Hemoglobin: 12.3 g/dL — ABNORMAL LOW (ref 13.0–17.7)
Immature Grans (Abs): 0 10*3/uL (ref 0.0–0.1)
Immature Granulocytes: 0 %
Lymphocytes Absolute: 1.7 10*3/uL (ref 0.7–3.1)
Lymphs: 24 %
MCH: 32.4 pg (ref 26.6–33.0)
MCHC: 32.4 g/dL (ref 31.5–35.7)
MCV: 100 fL — ABNORMAL HIGH (ref 79–97)
Monocytes Absolute: 0.8 10*3/uL (ref 0.1–0.9)
Monocytes: 12 %
NEUTROS PCT: 60 %
Neutrophils Absolute: 4.2 10*3/uL (ref 1.4–7.0)
Platelets: 289 10*3/uL (ref 150–450)
RBC: 3.8 x10E6/uL — AB (ref 4.14–5.80)
RDW: 11.6 % (ref 11.6–15.4)
WBC: 7 10*3/uL (ref 3.4–10.8)

## 2018-07-06 LAB — COMPREHENSIVE METABOLIC PANEL
ALT: 18 IU/L (ref 0–44)
AST: 21 IU/L (ref 0–40)
Albumin/Globulin Ratio: 2 (ref 1.2–2.2)
Albumin: 4.5 g/dL (ref 3.6–4.6)
Alkaline Phosphatase: 39 IU/L (ref 39–117)
BUN/Creatinine Ratio: 20 (ref 10–24)
BUN: 18 mg/dL (ref 8–27)
Bilirubin Total: 0.4 mg/dL (ref 0.0–1.2)
CO2: 26 mmol/L (ref 20–29)
Calcium: 9.8 mg/dL (ref 8.6–10.2)
Chloride: 102 mmol/L (ref 96–106)
Creatinine, Ser: 0.89 mg/dL (ref 0.76–1.27)
GFR calc Af Amer: 91 mL/min/{1.73_m2} (ref >59)
GFR calc non Af Amer: 79 mL/min/{1.73_m2} (ref >59)
GLOBULIN, TOTAL: 2.3 g/dL (ref 1.5–4.5)
Glucose: 121 mg/dL — ABNORMAL HIGH (ref 65–99)
Potassium: 4.9 mmol/L (ref 3.5–5.2)
Sodium: 143 mmol/L (ref 134–144)
TOTAL PROTEIN: 6.8 g/dL (ref 6.0–8.5)

## 2018-07-06 LAB — LIPID PANEL
Chol/HDL Ratio: 2.6 ratio (ref 0.0–5.0)
Cholesterol, Total: 152 mg/dL (ref 100–199)
HDL: 59 mg/dL (ref >39)
LDL Calculated: 66 mg/dL (ref 0–99)
TRIGLYCERIDES: 133 mg/dL (ref 0–149)
VLDL Cholesterol Cal: 27 mg/dL (ref 5–40)

## 2018-07-06 LAB — TSH: TSH: 1.49 u[IU]/mL (ref 0.450–4.500)

## 2018-07-13 ENCOUNTER — Encounter: Payer: Self-pay | Admitting: Podiatry

## 2018-07-13 ENCOUNTER — Ambulatory Visit (INDEPENDENT_AMBULATORY_CARE_PROVIDER_SITE_OTHER): Payer: Medicare Other | Admitting: Podiatry

## 2018-07-13 VITALS — BP 164/81 | HR 77

## 2018-07-13 DIAGNOSIS — E119 Type 2 diabetes mellitus without complications: Secondary | ICD-10-CM | POA: Diagnosis not present

## 2018-07-13 DIAGNOSIS — I6522 Occlusion and stenosis of left carotid artery: Secondary | ICD-10-CM | POA: Diagnosis not present

## 2018-07-13 DIAGNOSIS — D689 Coagulation defect, unspecified: Secondary | ICD-10-CM

## 2018-07-13 DIAGNOSIS — M79675 Pain in left toe(s): Secondary | ICD-10-CM | POA: Diagnosis not present

## 2018-07-13 DIAGNOSIS — B351 Tinea unguium: Secondary | ICD-10-CM

## 2018-07-13 DIAGNOSIS — M79674 Pain in right toe(s): Secondary | ICD-10-CM | POA: Diagnosis not present

## 2018-07-13 NOTE — Progress Notes (Signed)
This patient presents to the office with chief complaint of long thick nails and diabetic feet.  This patient  says there  is  no pain and discomfort in his  feet.  This patient says there are long thick painful second toe nails.  These nails are painful walking and wearing shoes.  Patient has no history of infection or drainage from both feet.  Patient is unable to  self treat his own nails . This patient presents  to the office today for treatment of the  long nails and a foot evaluation due to history of  diabetes.  General Appearance  Alert, conversant and in no acute stress.  Vascular  Dorsalis pedis and posterior tibial  pulses are palpable  bilaterally.  Capillary return is within normal limits  bilaterally. Temperature is within normal limits  bilaterally.  Neurologic  Senn-Weinstein monofilament wire test within normal limits  Left foot.  LOPS diminished right foot.. Muscle power within normal limits bilaterally.  Nails Thick disfigured discolored nails with subungual debris  Second toes  bilaterally. No evidence of bacterial infection or drainage bilaterally.  Orthopedic  No limitations of motion of motion feet .  No crepitus or effusions noted.  No bony pathology or digital deformities noted.  Skin  normotropic skin with no porokeratosis noted bilaterally.  No signs of infections or ulcers noted.     Onychomycosis  Diabetes with no foot complications  IE  Debride nails x 10.  A diabetic foot exam was performed and there is no evidence of any vascular or neurologic pathology.   RTC 3 months.   Gardiner Barefoot DPM

## 2018-07-18 ENCOUNTER — Other Ambulatory Visit: Payer: Self-pay | Admitting: Family Medicine

## 2018-07-19 NOTE — Telephone Encounter (Signed)
This was changed to carvedilol by his cardiologist, [name], in December.  He no longer takes metoprolol.  Probably need to clarify with pt or pharmacy, to ensure this is off his list and that he is taking the other meds.

## 2018-07-19 NOTE — Telephone Encounter (Signed)
This refill came through, but I do not see it on his med list. Is he to be taking? Just want to double check.

## 2018-07-31 ENCOUNTER — Ambulatory Visit (INDEPENDENT_AMBULATORY_CARE_PROVIDER_SITE_OTHER): Payer: Medicare Other | Admitting: Pulmonary Disease

## 2018-07-31 ENCOUNTER — Encounter: Payer: Self-pay | Admitting: Pulmonary Disease

## 2018-07-31 VITALS — BP 142/80 | HR 71 | Ht 70.0 in | Wt 176.6 lb

## 2018-07-31 DIAGNOSIS — G4733 Obstructive sleep apnea (adult) (pediatric): Secondary | ICD-10-CM | POA: Diagnosis not present

## 2018-07-31 DIAGNOSIS — I272 Pulmonary hypertension, unspecified: Secondary | ICD-10-CM | POA: Diagnosis not present

## 2018-07-31 DIAGNOSIS — I6522 Occlusion and stenosis of left carotid artery: Secondary | ICD-10-CM

## 2018-07-31 DIAGNOSIS — R942 Abnormal results of pulmonary function studies: Secondary | ICD-10-CM | POA: Diagnosis not present

## 2018-07-31 NOTE — Patient Instructions (Signed)
Continue CPAP with sleep We will contact your home supply company and try to get you fitted for a nasal mask Follow-up in 6 months for routine recheck We will continue on a schedule of annual CXR and PFTs (lung function test).  Next due January 2021

## 2018-07-31 NOTE — Progress Notes (Signed)
PULMONARY OFFICE FOLLOW UP NOTE  Requesting MD/Service: Arida/Dunn (cardiiology), Primary MD: Rita Ohara, MD Date of initial consultation: 05/03/18 Reason for consultation: Pulmonary hypertension  PT PROFILE: 85 y.o. male former smoker referred for evaluation of an incidental finding of pulmonary hypertension on echocardiogram.  Wife reports snoring and witnessed apneas.  Concern for possible obstructive sleep apnea  DATA: Echocardiogram 04/05/18: LVEF 55-60%.  Mild to moderate aortic stenosis.  Left atrium mildly dilated.  RV systolic function normal.  RVSP estimate 56 mmHg. PSG 05/27/18: AHI 29/hr. Recommended [employer] 5-20 cm H2O PFTs 06/08/18: FVC: 2.64 > 2.72 L (67 > 69 %pred), FEV1: 2.12 > 2.23 L (73 > 77 %pred), FEV1/FVC: 80%, TLC: 4.75 L (65 %pred), DLCO 77 %pred, DLCO/VA 122% pred    INTERVAL: No major events  SUBJ:  This is a scheduled follow-up to review his response to and tolerance of CPAP therapy prescribed last visit.  He is wearing the CPAP compliantly but does not wear it on Sundays to "clean it".  He is tolerating it reasonably well except he complains of oral drying due to CPAP.  His wife reports that his snoring is resolved.  However, he notes no improvement in daytime hypersomnolence or overall wellbeing.  He has no new complaints.  Vitals:   07/31/18 0825 07/31/18 0827  BP:  (!) 142/80  Pulse:  71  SpO2:  99%  Weight: 176 lb 9.6 oz (80.1 kg)   Height: 5\' 10"  (1.778 m)      EXAM:  Gen: NAD HEENT: NCAT, sclerae white Neck: No JVD Lungs: breath sounds full, no wheezes or other adventitious sounds Cardiovascular: RRR, no murmurs Abdomen: Soft, nontender, normal BS Ext: without clubbing, cyanosis, edema Neuro: grossly intact Skin: Limited exam, no lesions noted    DATA:   BMP Latest Ref Rng & Units 07/05/2018 06/14/2018 04/28/2018  Glucose 65 - 99 mg/dL 121(H) - 144(H)  BUN 8 - 27 mg/dL 18 - 25(H)  Creatinine 0.76 - 1.27 mg/dL 0.89 0.90 0.97  BUN/Creat  Ratio 10 - 24 20 - -  Sodium 134 - 144 mmol/L 143 - 141  Potassium 3.5 - 5.2 mmol/L 4.9 - 4.3  Chloride 96 - 106 mmol/L 102 - 106  CO2 20 - 29 mmol/L 26 - 28  Calcium 8.6 - 10.2 mg/dL 9.8 - 8.8(L)    CBC Latest Ref Rng & Units 07/05/2018 04/28/2018 11/21/2017  WBC 3.4 - 10.8 x10E3/uL 7.0 7.6 7.8  Hemoglobin 13.0 - 17.7 g/dL 12.3(L) 11.4(L) 14.0  Hematocrit 37.5 - 51.0 % 38.0 35.9(L) 43.0  Platelets 150 - 450 x10E3/uL 289 262 315    CXR: No new film  I have personally reviewed all chest radiographs reported above including CXRs and CT chest unless otherwise indicated  IMPRESSION:     ICD-10-CM   1. OSA (obstructive sleep apnea) G47.33 AMB REFERRAL FOR DME  2. Pulmonary hypertension (HCC) I27.20   3. Mild restrictive pattern present on PFTs of unclear etiology.  No radiographic correlate R94.2    Because of moderate pulmonary hypertension without another explanation, I reiterated the importance of compliance with CPAP.  I would have hoped that he felt more symptomatic improvement than he currently reports.  Presently, he is using a full facemask.  [employer] is his DME company  PLAN:  Continue CPAP with sleep We will contact [employer] to see if we can get him fitted for a nasal mask Follow-up in 6 months for routine recheck We will continue on a schedule of annual CXR  and PFTs-next due January 2021   Merton Border, MD PCCM service Mobile [phone] Pager [phone] 07/31/2018 10:21 AM

## 2018-08-03 DIAGNOSIS — H353123 Nonexudative age-related macular degeneration, left eye, advanced atrophic without subfoveal involvement: Secondary | ICD-10-CM | POA: Diagnosis not present

## 2018-08-03 DIAGNOSIS — H43813 Vitreous degeneration, bilateral: Secondary | ICD-10-CM | POA: Diagnosis not present

## 2018-08-03 DIAGNOSIS — H353211 Exudative age-related macular degeneration, right eye, with active choroidal neovascularization: Secondary | ICD-10-CM | POA: Diagnosis not present

## 2018-08-03 DIAGNOSIS — Z961 Presence of intraocular lens: Secondary | ICD-10-CM | POA: Diagnosis not present

## 2018-08-07 ENCOUNTER — Ambulatory Visit (INDEPENDENT_AMBULATORY_CARE_PROVIDER_SITE_OTHER): Payer: Medicare Other | Admitting: Neurology

## 2018-08-07 ENCOUNTER — Encounter: Payer: Self-pay | Admitting: Neurology

## 2018-08-07 VITALS — BP 168/77 | HR 60 | Wt 175.6 lb

## 2018-08-07 DIAGNOSIS — I6381 Other cerebral infarction due to occlusion or stenosis of small artery: Secondary | ICD-10-CM

## 2018-08-07 NOTE — Progress Notes (Signed)
[facility] [address] [place]. [place] [address] [phone]       OFFICE CONSULT NOTE  Jeffrey [name] Date of Birth:  16-Apr-1933 Medical Record Number:  [id number]   Referring MD:   Jeffrey [name] Reason for Referral:   seizures  HPI: Jeffrey [name] is a pleasant 85 year old Caucasian male was seen today for initial office consultation visit.  He is accompanied by his wife and daughter.  History is obtained from them and review of referral notes.  I have reviewed imaging films in PACS.  He has been having multiple brief episodes of altered awareness for several years but these appear to have increased in the last 6 months and frequency as well as duration.  He had a prolonged episode recently in November 2019 when he went to the ER.  CT scan of the head was obtained which I reviewed showed no acute abnormality except changes of age-related small vessel disease.  He is recently had a few back-to-back episodes as well.  He is unable to identify specific trigger for his episodes.  He is usually staring during these episodes and does not respond to commands.  He does not lose consciousness fall or hurt himself.  His episodes were previously lasting less than a minute the dose of few recent ones have been more prolonged.  Following some of these episodes he tends to get agitated and disoriented.  The patient's daughter feels she is noticed that he does some automatic hand movements and some of these episodes.  He denies any headache or any aura prior to these episodes.  He has no remote history of head injury with loss of consciousness intracranial hemorrhage or strokes.  There is no family history of epilepsy or seizures.  There is no specific pattern for these episodes which may occur once every few months to several in a week.  He has not had an EEG or MRI scan done or a trial of seizure medications. He had prior history of right anterior frontal lacunar infarct in 11/27/2014 he was  felt to have asymptomatic 80 to 90% left ICA stenosis at that time.  Echocardiogram was normal.  He has remote history of [facility] spotted fever with encephalitis.  He was treated with a 2-week course of doxycycline.  He did not have any definite seizures at that time.  He had episode of brief staring with a blank look on his face in May 2016 and was admitted to [facility] where he had an EEG which was negative for seizures.  He was seen by Jeffrey [name] neurologist as an outpatient and had a negative work-up for his seizures as well.  He was referred to Jeffrey [name] feels vascular surgeon for left carotid stenosis follow-up with ultrasound in the office showed stenosis to be much less and hence conservative follow-up was recommended.  He was seen by me in the office in March 2017 but has not followed up since then.  He was found to subsequently have a transient episode of paroxysmal A. fib and was started on Eliquis by cardiologist Jeffrey [name].  He also had a second EEG done in September 2016 which was also unremarkable except for mild generalized slowing. Update 08/07/2018 : He returns for follow-up after last visit 3 months ago.  He is accompanied by his wife.  He states he is doing well.  Is tolerating Keppra XR 500 mg quite well without any side effects.  He has had no  spells like seizures or speech difficulties since the last visit.  He has had no stroke or TIA symptoms either.  He underwent EEG on 06/12/2018 which was read by me and was normal.  MRI scan of the brain done on 06/14/2018 which I have personally reviewed the images shows tiny right posterior temporal white matter lacunar infarct which was clinically silent as patient had no symptoms at that time.  Carotid ultrasound previously done on 12/23/2017 had shown 40 to 59% left ICA stenosis no significant stenosis on the right.  Transthoracic echo done on 04/05/2018 and showed normal ejection fraction.  Lipid profile done on 07/05/2018  was unremarkable with LDL cholesterol of 66 mg percent, HDL of 59 and total cholesterol 152 mg percent.  Hemoglobin A1c on 06/28/2018 was 6.0.  Patient has been diagnosed with sleep apnea by his pulmonologist Jeffrey [name] and has started using CPAP every night recently.  He has no new complaints today.  He states his tremors are unchanged and are not functionally disabling.  He does follow-up with Jeffrey [name] from vascular surgery for carotid stenosis and will have follow-up carotid ultrasound done in upcoming visit in the spring ROS:   14 system review of systems is positive for hearing loss, apnea speech difficulty, denies and all other systems negative  PMH:  Past Medical History:  Diagnosis Date  . Acute CVA (cerebrovascular accident) ([place])   . Acute encephalopathy 11/27/2014  . Acute on chronic combined systolic and diastolic CHF (congestive heart failure) ([place])   . Aortic atherosclerosis ([place]) 11/25/2014  . Atrial fibrillation ([place])   . Diabetes mellitus   . Heart murmur    mild aortic stenosis  . Hypercholesteremia   . Hypertension   . Impaired fasting glucose   . Macular degeneration   . Multiple lacunar infarcts ([place])   . Obstructive sleep apnea 06/27/2018  . Prostate cancer ([place]) radiation + seeding implant (Jeffrey [name])  . Radiation proctitis 11/2012   treated with APC ablation and Canasa suppositories (Jeffrey [name])  . [facility] spotted fever) 10/2014   (hosp with FUO, encephalopathy)  . Rotator cuff tear, right 10/2010   supraspinatous and infraspinatous  . TIA (transient ischemic attack) 10/02/13   hosp at [facility] x 1 night  . 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 Marital status: Married    Spouse name: Not on file  . Number of children: 4  . Years of education: Not on file  . Highest education level: Not on file  Occupational History  . Occupation: Retired  Photographer  . Financial resource strain: Not on file  . Food insecurity:    Worry: Not on file    Inability: Not on file  . Transportation needs:    Medical: Not on file    Non-medical: Not on file  Tobacco Use  . Smoking status: Former Smoker    Packs/day: 1.00    Years: 20.00    Pack years: 20.00    Types: Cigarettes    Last attempt to quit: 05/31/1980    Years since quitting: 38.2  . Smokeless tobacco: Never Used  Substance and Sexual Activity  . Alcohol use: Yes    Alcohol/week: 14.0 standard drinks    Types: 14 Cans of beer per week    Comment: 2 cans of beer most days of the week  . Drug use: No  . Sexual activity: Not  Currently    Partners: Female    Comment: issues with ED  Lifestyle  . Physical activity:    Days per week: Not on file    Minutes per session: Not on file  . Stress: Not on file  Relationships  . Social connections:    Talks on phone: Not on file    Gets together: Not on file    Attends religious service: Not on file    Active member of club or organization: Not on file    Attends meetings of clubs or organizations: Not on file    Relationship status: Not on file  . Intimate partner violence:    Fear of current or ex partner: Not on file    Emotionally abused: Not on file    Physically abused: Not on file    Forced sexual activity: Not on file  Other Topics Concern  . Not on file  Social History Narrative   Married. Retired Engineer, structural.  2 sons, 2 daughters (all in [place]), 15 grandchildren, 7 great grandchild, 2 more expected    Medications:   Current Outpatient Medications on File Prior to Visit  Medication Sig Dispense Refill  . acetaminophen (TYLENOL ARTHRITIS PAIN) 650 MG CR tablet Take 1 tablet (650 mg total) by mouth every 8 (eight) hours as needed.    [name] amLODipine (NORVASC) 5 MG tablet Take 1 tablet (5 mg total) by mouth daily. 90 tablet 3  . betamethasone dipropionate (DIPROLENE) 0.05 % ointment Apply topically 2 (two) times daily.    .  carvedilol (COREG) 6.25 MG tablet Take 6.25 mg, one tablet, in the morning and 2 tablets (12.5 mg) in the evening. 180 tablet 3  . ELIQUIS 5 MG TABS tablet TAKE ONE TABLET BY MOUTH TWICE DAILY 180 tablet 1  . levETIRAcetam (KEPPRA XR) 500 MG 24 hr tablet Take 1 tablet (500 mg total) by mouth daily. 30 tablet 3  . lisinopril (PRINIVIL,ZESTRIL) 40 MG tablet     . lovastatin (MEVACOR) 40 MG tablet TAKE TWO TABLETS BY MOUTH AT BEDTIME 180 tablet 0  . Multiple Vitamins-Minerals (PRESERVISION AREDS) CAPS Take 1 capsule by mouth 2 (two) times daily.     [name] UNABLE TO FIND Bevacizumab(AVASTIN) chemo injection for the eye every 6 weeks     No current facility-administered medications on file prior to visit.     Allergies:  No Known Allergies  Physical Exam General: Frail elderly Caucasian male seated, in no evident distress Head: head normocephalic and atraumatic.   Neck: supple with soft left  carotid   bruit Cardiovascular: regular rate and rhythm, no murmurs Musculoskeletal: no deformity Skin:  no rash/petichiae Vascular:  Normal pulses all extremities  Neurologic Exam Mental Status: Awake and fully alert. Oriented to place and time. Recent and remote memory intact. Attention span, concentration and fund of knowledge appropriate. Mood and affect appropriate.  Positive glabellar tap.  Diminished facial expression. Cranial Nerves: Fundoscopic exam reveals sharp disc margins. Pupils equal, briskly reactive to light. Extraocular movements full without nystagmus. Visual fields full to confrontation. Hearing diminished bilaterally. Facial sensation intact. Face, tongue, palate moves normally and symmetrically.  Motor: Normal bulk and tone. Normal strength in all tested extremity muscles.  Mild action tremor of outstretched upper extremities left greater than right.  Intermittent head tremor.  Cogwheel rigidity left greater than right wrist upon activation mainly.  No bradykinesia.  Able to arise from  chair with arms folded across her chest.  Mild retropulsion present. Sensory.: intact  to touch , pinprick , position and vibratory sensation.  Coordination: Rapid alternating movements normal in all extremities. Finger-to-nose and heel-to-shin performed accurately bilaterally. Gait and Station: Arises from chair without difficulty. Stance is normal. Gait demonstrates normal stride length and balance but slight diminished left arm swing.  No festination no stooped posture.. Able to heel, toe and tandem walk without difficulty.  Reflexes: 1+ and symmetric. Toes downgoing.      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  He has previously had EEG done twice which has been negative for epileptiform activity.  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  We discussed stroke risk factors and stroke prevention and answered questions.  Continue Eliquis for stroke prevention and I do not believe switching to an alternative agent or adding aspirin is necessarily any better.  Maintain aggressive risk factor modification with strict control of hypertension with blood pressure goal below 130/90, hyper lipidemia with LDL goal below 70 mg percent and diabetes with hemoglobin A1c goal below 6.5%.  He was encouraged to eat a healthy diet with lots of fruits, vegetables, cereals and whole grains and to be active and exercise regularly.  Continue Keppra XR 500 mg daily for seizure prophylaxis health it seems to be working and he has no side effects.  Continue follow-up with JeffreyFields  vascular surgery for his moderate left carotid stenosis.  He will return for follow-up in the future in 6 months with Janett Billow my nurse practitioner call earlier if necessary.Greater than 50% time during this 25-minute  visit was spent on counseling and coordination of care and discussion about complex partial seizures and parkinsonian tremor and answering questions he will return for follow-up in 3 months or call earlier if necessary.  Antony Contras, MD Medical Director [facility] Pager: [phone] 08/07/2018 12:54 PM Note: This document was prepared with digital dictation and possible smart phrase technology. Any transcriptional errors that result from this process are unintentional.

## 2018-08-07 NOTE — Patient Instructions (Signed)
I had a long discussion with the patient and his wife regarding his MRI findings showing tiny silent lacunar infarct from which she is clinically asymptomatic.  We discussed stroke risk factors and stroke prevention and answered questions.  Continue Eliquis for stroke prevention and I do not believe switching to an alternative agent or adding aspirin is necessarily any better.  Maintain aggressive risk factor modification with strict control of hypertension with blood pressure goal below 130/90, hyper lipidemia with LDL goal below 70 mg percent and diabetes with hemoglobin A1c goal below 6.5%.  He was encouraged to eat a healthy diet with lots of fruits, vegetables, cereals and whole grains and to be active and exercise regularly.  Continue Keppra XR 500 mg daily for seizure prophylaxis health it seems to be working and he has no side effects.  Continue follow-up with Dr.Fields vascular surgery for his moderate left carotid stenosis.  He will return for follow-up in the future in 6 months with Janett Billow my nurse practitioner call earlier if necessary.

## 2018-08-16 ENCOUNTER — Other Ambulatory Visit: Payer: Self-pay | Admitting: Neurology

## 2018-09-28 ENCOUNTER — Other Ambulatory Visit: Payer: Self-pay | Admitting: Cardiovascular Disease

## 2018-10-16 ENCOUNTER — Telehealth: Payer: Self-pay

## 2018-10-16 NOTE — Telephone Encounter (Signed)

## 2018-10-19 ENCOUNTER — Other Ambulatory Visit: Payer: Self-pay

## 2018-10-19 ENCOUNTER — Encounter: Payer: Self-pay | Admitting: Cardiovascular Disease

## 2018-10-19 ENCOUNTER — Telehealth (INDEPENDENT_AMBULATORY_CARE_PROVIDER_SITE_OTHER): Payer: Medicare Other | Admitting: Cardiovascular Disease

## 2018-10-19 VITALS — BP 114/60 | HR 71 | Ht 71.0 in | Wt 170.0 lb

## 2018-10-19 DIAGNOSIS — I480 Paroxysmal atrial fibrillation: Secondary | ICD-10-CM | POA: Diagnosis not present

## 2018-10-19 DIAGNOSIS — I10 Essential (primary) hypertension: Secondary | ICD-10-CM

## 2018-10-19 MED ORDER — CARVEDILOL 6.25 MG PO TABS: 6.2500 mg | ORAL_TABLET | Freq: Two times a day (BID) | ORAL | 3 refills | Status: DC

## 2018-10-19 NOTE — Progress Notes (Signed)
Virtual Visit via Telephone Note   This visit type was conducted due to national recommendations for restrictions regarding the COVID-19 Pandemic (e.g. social distancing) in an effort to limit this patient's exposure and mitigate transmission in our community.  Due to his co-morbid illnesses, this patient is at least at moderate risk for complications without adequate follow up.  This format is felt to be most appropriate for this patient at this time.  The patient did not have access to video technology/had technical difficulties with video requiring transitioning to audio format only (telephone).  All issues noted in this document were discussed and addressed.  No physical exam could be performed with this format.  Please refer to the patient's chart for his  consent to telehealth for [facility].   Date:  10/19/2018   ID:  Jeffrey [name], DOB 1933/02/09, MRN [id number]  Patient Location: Home Provider Location: Office  PCP:  Rita Ohara, MD  Cardiologist:  Kathlyn Sacramento, MD  Electrophysiologist:  None   Evaluation Performed:  Follow-Up Visit  Chief Complaint: Frequent urination at night  History of Present Illness:    Jeffrey [name] is a 85 y.o. male who was reached via phone for a follow-up visit regarding paroxysmal atrial fibrillation and essential hypertension. Other medical problems include hyperlipidemia, TIA,  aortic stenosis, moderate left carotid stenosis and borderline diabetes mellitus. He had previous stroke in June 2016 and later was found to have [facility] Fever which was treated with doxycycline.   Nuclear stress test in August of 2016  showed no evidence of ischemia with normal ejection fraction. Most recent echocardiogram in November 2019 showed normal LV systolic function, moderate aortic stenosis with a mean gradient of 21 mmHg and valve area of 0.95.  Pulmonary pressure was moderately elevated at 56 mmHg.  The patient had issues with severely  elevated blood pressure last year which required switching metoprolol to carvedilol and adding amlodipine.  He has a component of whitecoat syndrome. The patient was referred to pulmonary due to presence of pulmonary hypertension on echo.  He was found to have sleep apnea and currently using CPAP.  He is doing very well with no chest pain, shortness of breath or palpitations.  He continues to have intermittent episodes of low blood pressure.  His biggest issue seems to be frequent urination at night that affects his quality of sleep.   The patient does not have symptoms concerning for COVID-19 infection (fever, chills, cough, or new shortness of breath).    Past Medical History:  Diagnosis Date  . Acute CVA (cerebrovascular accident) ([place])   . Acute encephalopathy 11/27/2014  . Acute on chronic combined systolic and diastolic CHF (congestive heart failure) ([place])   . Aortic atherosclerosis ([place]) 11/25/2014  . Atrial fibrillation ([place])   . Diabetes mellitus   . Heart murmur    mild aortic stenosis  . Hypercholesteremia   . Hypertension   . Impaired fasting glucose   . Macular degeneration   . Multiple lacunar infarcts ([place])   . Obstructive sleep apnea 06/27/2018  . Prostate cancer ([place]) radiation + seeding implant (Dr.[name])  . 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 Hilarie Fredrickson)  . RMS[facility] spotted fever) 10/2014   (hosp with FUO, encephalopathy)  . Rotator cuff tear, right 10/2010   supraspinatous and infraspinatous  . TIA (transient ischemic attack) 10/02/13   hosp at [facility] x 1 night  . 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 CATARACT EXTRACTION Right 01/2017  . CIRCUMCISION  01/2017   [name]  . COLONOSCOPY N/A 11/28/2012   Procedure: COLONOSCOPY;  Surgeon: Jerene Bears, MD;  Location: [facility] ENDOSCOPY;  Service: Gastroenterology;   Laterality: N/A;  . INTERNAL URETHROTOMY  01/27/2016   WF  . prostate seed implant    . ROTATOR CUFF REPAIR  06/16/2011   right (Dr. Berenice Primas)  . TONSILLECTOMY AND ADENOIDECTOMY  age 53     Current Meds  Medication Sig  . acetaminophen (TYLENOL ARTHRITIS PAIN) 650 MG CR tablet Take 1 tablet (650 mg total) by mouth every 8 (eight) hours as needed.  [name] amLODipine (NORVASC) 5 MG tablet Take 1 tablet (5 mg total) by mouth daily.  . betamethasone dipropionate (DIPROLENE) 0.05 % ointment Apply topically 2 (two) times daily.  . carvedilol (COREG) 6.25 MG tablet Take 6.25 mg, one tablet, in the morning and 2 tablets (12.5 mg) in the evening.  [name] ELIQUIS 5 MG TABS tablet TAKE ONE TABLET BY MOUTH TWICE DAILY  . levETIRAcetam (KEPPRA XR) 500 MG 24 hr tablet TAKE ONE TABLET EVERY DAY  . lisinopril (ZESTRIL) 40 MG tablet TAKE ONE TABLET BY MOUTH EVERY DAY  . lovastatin (MEVACOR) 40 MG tablet TAKE TWO TABLETS BY MOUTH AT BEDTIME  . Multiple Vitamins-Minerals (PRESERVISION AREDS) CAPS Take 1 capsule by mouth 2 (two) times daily.   [name] UNABLE TO FIND Bevacizumab(AVASTIN) chemo injection for the eye every 6 weeks     Allergies:   Patient has no known allergies.   Social History   Tobacco Use  . Smoking status: Former Smoker    Packs/day: 1.00    Years: 20.00    Pack years: 20.00    Types: Cigarettes    Last attempt to quit: 05/31/1980    Years since quitting: 38.4  . Smokeless tobacco: Never Used  Substance Use Topics  . Alcohol use: Yes    Alcohol/week: 14.0 standard drinks    Types: 14 Cans of beer per week    Comment: 2 cans of beer most days of the week  . Drug use: No     Family Hx: The patient's family history includes Cancer in his daughter; Congestive Heart Failure in his grandchild; Dementia in his mother; Diabetes in his daughter and mother; Heart disease in his mother and son; Heart disease (age of onset: 60) in his father; Hypertension in his mother; Stroke in his sister.  ROS:    Please see the history of present illness.     All other systems reviewed and are negative.   Prior CV studies:   The following studies were reviewed today:  Reviewed most recent echocardiogram in November 2019  Labs/Other Tests and Data Reviewed:    EKG:  No ECG reviewed.  Recent Labs: 07/05/2018: ALT 18; BUN 18; Creatinine, Ser 0.89; Hemoglobin 12.3; Platelets 289; Potassium 4.9; Sodium 143; TSH 1.490   Recent Lipid Panel Lab Results  Component Value Date/Time   CHOL 152 07/05/2018 09:10 AM   CHOL 184 10/01/2013 05:19 PM   TRIG 133 07/05/2018 09:10 AM   TRIG 400 (H) 10/01/2013 05:19 PM   HDL 59 07/05/2018 09:10 AM   HDL 55 10/01/2013 05:19 PM   CHOLHDL 2.6 07/05/2018 09:10 AM   CHOLHDL 2.3 05/27/2017 10:07 AM   LDLCALC 66 07/05/2018 09:10 AM   LDLCALC 78 05/27/2017 10:07 AM   LDLCALC 49 10/01/2013 05:19 PM  Wt Readings from Last 3 Encounters:  10/19/18 170 lb (77.1 kg)  08/07/18 175 lb 9.6 oz (79.7 kg)  07/31/18 176 lb 9.6 oz (80.1 kg)     Objective:    Vital Signs:  BP 114/60   Pulse 71   Ht 5\' 11"  (1.803 m)   Wt 170 lb (77.1 kg)   BMI 23.71 kg/m    VITAL SIGNS:  reviewed  ASSESSMENT & PLAN:    1.  Paroxysmal atrial fibrillation: On long-term anticoagulation with Eliquis.  No recurrent symptoms.  He is doing well overall.  I reviewed labs from February which were unremarkable  2.  Essential hypertension: He continues to have some low blood pressure and thus I will switch carvedilol to 6.25 mg twice daily  3.  Moderate aortic stenosis.  Repeat echocardiogram in November 2020.  4.  Obstructive sleep apnea currently on CPAP but he has hard time with it due to waking up to urinate.  5.  Frequent urination: There could be a component of BPH.  I asked him to discuss this with his primary care physician.   COVID-19 Education: The signs and symptoms of COVID-19 were discussed with the patient and how to seek care for testing (follow up with PCP or arrange  E-visit).   The importance of social distancing was discussed today.  Time:   Today, I have spent 12 minutes with the patient with telehealth technology discussing the above problems.     Medication Adjustments/Labs and Tests Ordered: Current medicines are reviewed at length with the patient today.  Concerns regarding medicines are outlined above.   Tests Ordered: No orders of the defined types were placed in this encounter.   Medication Changes: No orders of the defined types were placed in this encounter.   Disposition:  Follow up in 4 month(s)  Signed, Kathlyn Sacramento, MD  10/19/2018 7:57 AM    [facility]

## 2018-10-19 NOTE — Patient Instructions (Signed)
Medication Instructions:  Decrease carvedilol to 6.25 mg twice daily. If you need a refill on your cardiac medications before your next appointment, please call your pharmacy.   Lab work: None If you have labs (blood work) drawn today and your tests are completely normal, you will receive your results only by: [name] MyChart Message (if you have MyChart) OR . A paper copy in the mail If you have any lab test that is abnormal or we need to change your treatment, we will call you to review the results.  Testing/Procedures: None  Follow-Up: At [facility], you and your health needs are our priority.  As part of our continuing mission to provide you with exceptional heart care, we have created designated Provider Care Teams.  These Care Teams include your primary Cardiologist (physician) and Advanced Practice Providers (APPs -  Physician Assistants and Nurse Practitioners) who all work together to provide you with the care you need, when you need it. You will need a follow up appointment in 4 months.  Please call our office 2 months in advance to schedule this appointment.  You may see Kathlyn Sacramento, MD or one of the following Advanced Practice Providers on your designated Care Team:   Murray Hodgkins, NP Christell Faith, PA-C . Marrianne Mood, PA-C

## 2018-10-24 ENCOUNTER — Other Ambulatory Visit: Payer: Self-pay | Admitting: Cardiovascular Disease

## 2018-10-24 NOTE — Telephone Encounter (Signed)
Please review for refill.  

## 2018-11-09 DIAGNOSIS — H353211 Exudative age-related macular degeneration, right eye, with active choroidal neovascularization: Secondary | ICD-10-CM | POA: Diagnosis not present

## 2018-11-09 DIAGNOSIS — H353123 Nonexudative age-related macular degeneration, left eye, advanced atrophic without subfoveal involvement: Secondary | ICD-10-CM | POA: Diagnosis not present

## 2018-11-13 ENCOUNTER — Other Ambulatory Visit: Payer: Self-pay | Admitting: Cardiovascular Disease

## 2018-11-13 ENCOUNTER — Other Ambulatory Visit: Payer: Self-pay | Admitting: Family Medicine

## 2018-11-13 DIAGNOSIS — E7800 Pure hypercholesterolemia, unspecified: Secondary | ICD-10-CM

## 2018-11-14 NOTE — Telephone Encounter (Signed)
Pt has appt.12/24/18

## 2018-11-17 DIAGNOSIS — K627 Radiation proctitis: Secondary | ICD-10-CM | POA: Diagnosis not present

## 2018-11-17 DIAGNOSIS — N35014 Post-traumatic urethral stricture, male, unspecified: Secondary | ICD-10-CM | POA: Diagnosis not present

## 2018-11-17 DIAGNOSIS — Z8546 Personal history of malignant neoplasm of prostate: Secondary | ICD-10-CM | POA: Diagnosis not present

## 2018-11-29 DIAGNOSIS — C61 Malignant neoplasm of prostate: Secondary | ICD-10-CM | POA: Diagnosis not present

## 2018-12-14 ENCOUNTER — Telehealth: Payer: Self-pay | Admitting: Family Medicine

## 2018-12-14 NOTE — Telephone Encounter (Signed)
Faxed

## 2018-12-14 NOTE — Telephone Encounter (Signed)
Form signed; needs to be copied/scanned into chart, return order (signed order for genetic test)

## 2018-12-14 NOTE — Telephone Encounter (Signed)
Pt daughter dropped off form to be filled out put in your folder

## 2018-12-19 ENCOUNTER — Other Ambulatory Visit: Payer: Self-pay | Admitting: Family Medicine

## 2018-12-19 DIAGNOSIS — Z7901 Long term (current) use of anticoagulants: Secondary | ICD-10-CM

## 2018-12-19 DIAGNOSIS — E1129 Type 2 diabetes mellitus with other diabetic kidney complication: Secondary | ICD-10-CM

## 2018-12-19 DIAGNOSIS — E7800 Pure hypercholesterolemia, unspecified: Secondary | ICD-10-CM

## 2018-12-19 DIAGNOSIS — R809 Proteinuria, unspecified: Secondary | ICD-10-CM

## 2018-12-19 DIAGNOSIS — Z5181 Encounter for therapeutic drug level monitoring: Secondary | ICD-10-CM

## 2018-12-19 DIAGNOSIS — I10 Essential (primary) hypertension: Secondary | ICD-10-CM

## 2018-12-22 ENCOUNTER — Other Ambulatory Visit: Payer: Self-pay

## 2018-12-22 ENCOUNTER — Other Ambulatory Visit: Payer: Medicare Other

## 2018-12-22 DIAGNOSIS — Z7901 Long term (current) use of anticoagulants: Secondary | ICD-10-CM | POA: Diagnosis not present

## 2018-12-22 DIAGNOSIS — I10 Essential (primary) hypertension: Secondary | ICD-10-CM

## 2018-12-22 DIAGNOSIS — R809 Proteinuria, unspecified: Secondary | ICD-10-CM | POA: Diagnosis not present

## 2018-12-22 DIAGNOSIS — E1129 Type 2 diabetes mellitus with other diabetic kidney complication: Secondary | ICD-10-CM

## 2018-12-22 DIAGNOSIS — Z5181 Encounter for therapeutic drug level monitoring: Secondary | ICD-10-CM

## 2018-12-22 DIAGNOSIS — E7800 Pure hypercholesterolemia, unspecified: Secondary | ICD-10-CM | POA: Diagnosis not present

## 2018-12-23 LAB — COMPREHENSIVE METABOLIC PANEL
ALT: 21 IU/L (ref 0–44)
AST: 21 IU/L (ref 0–40)
Albumin/Globulin Ratio: 1.9 (ref 1.2–2.2)
Albumin: 4.6 g/dL (ref 3.6–4.6)
Alkaline Phosphatase: 37 IU/L — ABNORMAL LOW (ref 39–117)
BUN/Creatinine Ratio: 20 (ref 10–24)
BUN: 22 mg/dL (ref 8–27)
Bilirubin Total: 0.5 mg/dL (ref 0.0–1.2)
CO2: 24 mmol/L (ref 20–29)
Calcium: 9.6 mg/dL (ref 8.6–10.2)
Chloride: 99 mmol/L (ref 96–106)
Creatinine, Ser: 1.11 mg/dL (ref 0.76–1.27)
GFR calc Af Amer: 70 mL/min/{1.73_m2} (ref >59)
GFR calc non Af Amer: 61 mL/min/{1.73_m2} (ref >59)
Globulin, Total: 2.4 g/dL (ref 1.5–4.5)
Glucose: 113 mg/dL — ABNORMAL HIGH (ref 65–99)
Potassium: 5.3 mmol/L — ABNORMAL HIGH (ref 3.5–5.2)
Sodium: 142 mmol/L (ref 134–144)
Total Protein: 7 g/dL (ref 6.0–8.5)

## 2018-12-23 LAB — CBC WITH DIFFERENTIAL/PLATELET
Basophils Absolute: 0 10*3/uL (ref 0.0–0.2)
Basos: 1 %
EOS (ABSOLUTE): 0.1 10*3/uL (ref 0.0–0.4)
Eos: 1 %
Hematocrit: 35.9 % — ABNORMAL LOW (ref 37.5–51.0)
Hemoglobin: 12.1 g/dL — ABNORMAL LOW (ref 13.0–17.7)
Immature Grans (Abs): 0 10*3/uL (ref 0.0–0.1)
Immature Granulocytes: 0 %
Lymphocytes Absolute: 1.9 10*3/uL (ref 0.7–3.1)
Lymphs: 24 %
MCH: 32.9 pg (ref 26.6–33.0)
MCHC: 33.7 g/dL (ref 31.5–35.7)
MCV: 98 fL — ABNORMAL HIGH (ref 79–97)
Monocytes Absolute: 0.9 10*3/uL (ref 0.1–0.9)
Monocytes: 11 %
Neutrophils Absolute: 5.2 10*3/uL (ref 1.4–7.0)
Neutrophils: 63 %
Platelets: 283 10*3/uL (ref 150–450)
RBC: 3.68 x10E6/uL — ABNORMAL LOW (ref 4.14–5.80)
RDW: 11.8 % (ref 11.6–15.4)
WBC: 8.1 10*3/uL (ref 3.4–10.8)

## 2018-12-23 LAB — HEMOGLOBIN A1C
Est. average glucose Bld gHb Est-mCnc: 126 mg/dL
Hgb A1c MFr Bld: 6 % — ABNORMAL HIGH (ref 4.8–5.6)

## 2018-12-26 NOTE — Progress Notes (Signed)
Start time: 11:16 End time: 11:49  Virtual Visit via Telephone Note  I connected with Jeffrey [name] on 12/26/18 at 11:00 AM EDT by telephone and verified that I am speaking with the correct person using two identifiers.  Location: Patient: home, alone Provider: office   I discussed the limitations, risks, security and privacy concerns of performing an evaluation and management service by telephone and the availability of in person appointments. I also discussed with the patient that there may be a patient responsible charge related to this service. The patient expressed understanding and agreed to proceed. He consents to insurance being filed for this visit. He denies having access for a video visit.   History of Present Illness:  Chief Complaint  Patient presents with  . Medication Management    no refills needed, wants to discuss flu shot    Purpose of this visit is 6 month follow-up on chronic problems.  Hypertension:  He continues on lisinopril, carvedilol and amlodipine per [name]. Last visit was in 09/2018, and carvedilol dose was decreased (from 12.5 mg, due to low BP's).  He has a significant white coat component to his blood pressures, but monitors them regularly at home. BP's have been running125-130's/55-60 during the day Once had a high of 155/60. Pulse ranges in the 70's.  Feels a little weak "staggery" when blood pressure is low. He denies headaches, lightheadedness,chest pain, palpitations, shortness of breath, edema. Reports compliance with his medications. Gets tired when working the yard/garden, not tolerant of the sun/heat. Drinking 2-3 beers/day.  Paroxysmal atrial fibrillation--Cardiologist recommends lifelong anticoagulation, continues on Eliquis. Patient deniespalpitations, tachycardia, deniesany significant bleeding/bruising or side effects from medication. Last echo was 03/2018, normal EF, mild-mod aortic stenosis, pulmonary hypertension noted. Repeat  echocardiogram is due 04/2019.  Pulmonary hypertension:  He was found to have mod sleep apnea on recent sleep study, and was started on CPAP.  He reports compliance with CPAP use. He can't notice much of a difference in how he feels.  He reports using it 6 days/week, takes a break on one day, cleans the equipment. Wears it 4-8 hours/night. He sleeps well until 3-4am, then is up frequently after that to void.  DM/IFG--He doesn't check his sugars. He goes to the eye doctor regularly, for macular degeneration, last visit in 04/2018 ([name]).Getting shots in the right eye, less frequently now (cut back to every 10 weeks).  He denies any feet concerns, numbness/tingling or sores.  "My toes feel like I have extra skin or wearing socks all the time".  He has urinary frequency x several years (related to prostate), no change; denies polydipsia.  Hyperlipidemia: Compliant with medications (80mg  of lovastatin); denies side effects. Tries to follow a lowfat, low cholesterol diet, cut back on fried foods. Lipids were at goal on last check. Lab Results  Component Value Date   CHOL 152 07/05/2018   HDL 59 07/05/2018   LDLCALC 66 07/05/2018   TRIG 133 07/05/2018   CHOLHDL 2.6 07/05/2018    H/o stroke in 10/2014(right frontal white matter infarct in June 2016 secondary to small vessel disease). He has asymptomatic L carotid artery stenosis, and is monitored by [name] for this.Last ultrasound was 11/2017: Right Carotid: Velocities in the right ICA are consistent with a 1-39% stenosis. Left Carotid: Velocities in the left ICA are consistent with a 40-59% stenosis. Calcified plaque may obscure high velocities. Vertebrals: Bilateral vertebral arteries demonstrate antegrade flow. Subclavians: Normal flow hemodynamics were seen in bilateral subclavianarteries.  He  saw [name] in 04/2018 after ER visit for alteration in mental status, suspecting complex partial seizures. He was started on  Keppra XR 500mg  qd, and had EEG and MRI. EEG was normal (done twice, no epileptiform activity). He last saw [name] in 07/2018. 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 He reports the tremor comes and goes, sometimes worse than others, sometimes not noticeable at all. MRI 06/14/2018: IMPRESSION: Small area of restricted diffusion in the right posterior temporal lobe most consistent with small acute or subacute infarct Moderate atrophy and moderate chronic microvascular ischemic change. Chronic microhemorrhage left occipital lobe.  [name] states MRI findings show tiny silent lacunar infarct from which he is clinically asymptomatic. Since being on the [place], he hasn't had any further staring spells where he doesn't respond. Denies side effects to Keppra that he can tell. [name] recommended continuing Keppra XR 500mg  daily for seizure prophylaxis.   Prostate cancer: Monitored by [name] every 6 months.He underwent internal urethrotomy, cystourethroscopy and circumcision on 02/15/17. He reports not reallynoticing improvement since treatment of the stricture,continues with frequency and leakage.  His last visit with [name] was in June, and PSA was <0.01. Repeat cystoscopy is being planned, to look for recurrent stricture, given persistent/worsening urinary frequency. He doesn't really want to do this, since he recalls it being painful, and not helping at all the last time.   PMH, PSH, SH reviewed  Outpatient Encounter Medications as of 12/27/2018  Medication Sig Note  . acetaminophen (TYLENOL ARTHRITIS PAIN) 650 MG CR tablet Take 1 tablet (650 mg total) by mouth every 8 (eight) hours as needed. 04/28/2015: 2 qam and 2 qpm  . amLODipine (NORVASC) 5 MG tablet TAKE ONE TABLET (5 MG) BY MOUTH EVERY DAY   . carvedilol (COREG) 6.25 MG tablet Take 1 tablet (6.25 mg total) by mouth 2 (two) times daily with a meal.   . ELIQUIS 5 MG TABS  tablet TAKE ONE TABLET BY MOUTH TWICE DAILY   . levETIRAcetam (KEPPRA XR) 500 MG 24 hr tablet TAKE ONE TABLET EVERY DAY   . lisinopril (ZESTRIL) 40 MG tablet TAKE ONE TABLET BY MOUTH EVERY DAY   . loratadine (CLARITIN) 10 MG tablet Take by mouth.   . lovastatin (MEVACOR) 40 MG tablet TAKE TWO TABLETS AT BEDTIME   . Multiple Vitamins-Minerals (PRESERVISION AREDS) CAPS Take 1 capsule by mouth 2 (two) times daily.    [name] UNABLE TO FIND Bevacizumab(AVASTIN) chemo injection for the eye every 6 weeks   . betamethasone dipropionate (DIPROLENE) 0.05 % ointment Apply topically 2 (two) times daily. 12/27/2018: prn   No facility-administered encounter medications on file as of 12/27/2018.    No Known Allergies  ROS: no fever, chills, URI symptoms.  He has a chronic runny nose (hasn't been taking claritin recently).  He denies cough, shortness of breath, headaches, staring spells/seizures. No GI complaints.  +urinary frequency. No hematuria, dysuria.  No bleeding, rashes or other concerns.  He reports having some "cabin fever". Denies depression.  Observations/Objective:  BP 112/60   Ht 5\' 11"  (1.803 m)   Wt 173 lb (78.5 kg)   BMI 24.13 kg/m    Wt Readings from Last 3 Encounters:  12/22/18 173 lb (78.5 kg)  10/19/18 170 lb (77.1 kg)  08/07/18 175 lb 9.6 oz (79.7 kg)   Pleasant male, in good spirits.  He is alert, oriented. Normal speech. Exam is limited due to the virtual nature of  the visit.   Lab Results  Component Value Date   HGBA1C 6.0 (H) 12/22/2018   Fasting glucose 113    Chemistry      Component Value Date/Time   NA 142 12/22/2018 0830   NA 137 10/01/2013 1719   K 5.3 (H) 12/22/2018 0830   K 3.6 10/01/2013 1719   CL 99 12/22/2018 0830   CL 101 10/01/2013 1719   CO2 24 12/22/2018 0830   CO2 27 10/01/2013 1719   BUN 22 12/22/2018 0830   BUN 17 10/01/2013 1719   CREATININE 1.11 12/22/2018 0830   CREATININE 0.78 05/27/2017 1007      Component Value Date/Time   CALCIUM  9.6 12/22/2018 0830   CALCIUM 9.4 10/01/2013 1719   ALKPHOS 37 (L) 12/22/2018 0830   ALKPHOS 46 10/01/2013 1719   AST 21 12/22/2018 0830   AST 31 10/01/2013 1719   ALT 21 12/22/2018 0830   ALT 45 10/01/2013 1719   BILITOT 0.5 12/22/2018 0830   BILITOT 0.4 10/01/2013 1719     Lab Results  Component Value Date   WBC 8.1 12/22/2018   HGB 12.1 (L) 12/22/2018   HCT 35.9 (L) 12/22/2018   MCV 98 (H) 12/22/2018   PLT 283 12/22/2018    Assessment and Plan:  Type 2 diabetes mellitus with microalbuminuria, without long-term current use of insulin ([place]) - controlled. Encouraged cutting back on alcohol, increased exercise, weight loss at abdomen   Pure hypercholesterolemia - cont statin   Essential hypertension, benign - well controlled  Left carotid artery stenosis - should be due for f/u, reminded.  Obstructive sleep apnea - cont CPAP--discussed potential cardiac benefits (he had pulm HTN), not just in how he feels  Pulmonary hypertension ([place]) - cont CPAP   Urinary frequency - pt hesitant to do cystocopy; encouraged him to d/w urologist.  ?potential for med trial? Cut back on ETOH   Personal history of prostate cancer - doing well   Need f/u scheduled for AWV/med check in 6 mos (end of Jan/early Feb)  Discuss concerns about cystoscopy--to discuss with [name]' nurse, vs sending note.  Counseled re: "how to beat cabin fever"--encouraged exercise, puzzles, learning new skill/craft.  Encouraged to drink more water, less beer.   Follow Up Instructions:  AWV printed/mailed to pt.  I discussed the assessment and treatment plan with the patient. The patient was provided an opportunity to ask questions and all were answered. The patient agreed with the plan and demonstrated an understanding of the instructions.   The patient was advised to call back or seek an in-person evaluation if the symptoms worsen or if the condition fails to improve as anticipated.  I provided 33  minutes of non-face-to-face time during this encounter.   Vikki Ports, MD

## 2018-12-27 ENCOUNTER — Encounter: Payer: Self-pay | Admitting: Family Medicine

## 2018-12-27 ENCOUNTER — Other Ambulatory Visit: Payer: Self-pay

## 2018-12-27 ENCOUNTER — Ambulatory Visit (INDEPENDENT_AMBULATORY_CARE_PROVIDER_SITE_OTHER): Payer: Medicare Other | Admitting: Family Medicine

## 2018-12-27 VITALS — BP 112/60 | Ht 71.0 in | Wt 173.0 lb

## 2018-12-27 DIAGNOSIS — I6522 Occlusion and stenosis of left carotid artery: Secondary | ICD-10-CM | POA: Diagnosis not present

## 2018-12-27 DIAGNOSIS — R809 Proteinuria, unspecified: Secondary | ICD-10-CM

## 2018-12-27 DIAGNOSIS — R350 Frequency of micturition: Secondary | ICD-10-CM | POA: Diagnosis not present

## 2018-12-27 DIAGNOSIS — G4733 Obstructive sleep apnea (adult) (pediatric): Secondary | ICD-10-CM

## 2018-12-27 DIAGNOSIS — Z8546 Personal history of malignant neoplasm of prostate: Secondary | ICD-10-CM

## 2018-12-27 DIAGNOSIS — E7800 Pure hypercholesterolemia, unspecified: Secondary | ICD-10-CM | POA: Diagnosis not present

## 2018-12-27 DIAGNOSIS — I272 Pulmonary hypertension, unspecified: Secondary | ICD-10-CM

## 2018-12-27 DIAGNOSIS — I10 Essential (primary) hypertension: Secondary | ICD-10-CM | POA: Diagnosis not present

## 2018-12-27 DIAGNOSIS — E1129 Type 2 diabetes mellitus with other diabetic kidney complication: Secondary | ICD-10-CM | POA: Diagnosis not present

## 2018-12-28 NOTE — Patient Instructions (Addendum)
Continue your current medications.  Today we discussed possibly sending [name] a note or talking to his nurse about your hesitation/concerns about cystoscopy, wondering if there are other alternatives, including possible medications to treat urinary frequency before the more invasive procedure.  Today we discussed cutting back on beer intake (limit to one, occasional second), and drinking more water.  We discussed how to "beat cabin fever", while staying safe.  We discussed keeping your mind engaged with puzzles, possibly learning a new hobby/language/craft, organizing garage/closets, watching movies or Netflix series if you're able to, and staying active. Setting some goals help.   Please try and get at least 30 minutes of exercise daily (even if broken up into 10-15 minute intervals).  You can do this inside.  Please use extreme caution in the heat, and stay well hydrated.  Don't hesitate to contact me with any questions and concerns. Stay well!

## 2018-12-29 ENCOUNTER — Emergency Department: Payer: Medicare Other

## 2018-12-29 ENCOUNTER — Emergency Department
Admission: EM | Admit: 2018-12-29 | Discharge: 2018-12-29 | Disposition: A | Discharge status: Home / Self Care | Payer: Medicare Other | Attending: Emergency Medicine | Admitting: Emergency Medicine

## 2018-12-29 DIAGNOSIS — Z794 Long term (current) use of insulin: Secondary | ICD-10-CM | POA: Diagnosis not present

## 2018-12-29 DIAGNOSIS — S0990XA Unspecified injury of head, initial encounter: Secondary | ICD-10-CM

## 2018-12-29 DIAGNOSIS — I4891 Unspecified atrial fibrillation: Secondary | ICD-10-CM | POA: Insufficient documentation

## 2018-12-29 DIAGNOSIS — S199XXA Unspecified injury of neck, initial encounter: Secondary | ICD-10-CM | POA: Diagnosis not present

## 2018-12-29 DIAGNOSIS — Y929 Unspecified place or not applicable: Secondary | ICD-10-CM | POA: Insufficient documentation

## 2018-12-29 DIAGNOSIS — Z7901 Long term (current) use of anticoagulants: Secondary | ICD-10-CM | POA: Insufficient documentation

## 2018-12-29 DIAGNOSIS — Y939 Activity, unspecified: Secondary | ICD-10-CM | POA: Insufficient documentation

## 2018-12-29 DIAGNOSIS — Z8673 Personal history of transient ischemic attack (TIA), and cerebral infarction without residual deficits: Secondary | ICD-10-CM | POA: Diagnosis not present

## 2018-12-29 DIAGNOSIS — W1839XA Other fall on same level, initial encounter: Secondary | ICD-10-CM | POA: Insufficient documentation

## 2018-12-29 DIAGNOSIS — Y999 Unspecified external cause status: Secondary | ICD-10-CM | POA: Diagnosis not present

## 2018-12-29 DIAGNOSIS — Z03818 Encounter for observation for suspected exposure to other biological agents ruled out: Secondary | ICD-10-CM | POA: Diagnosis not present

## 2018-12-29 DIAGNOSIS — I110 Hypertensive heart disease with heart failure: Secondary | ICD-10-CM | POA: Insufficient documentation

## 2018-12-29 DIAGNOSIS — Z20828 Contact with and (suspected) exposure to other viral communicable diseases: Secondary | ICD-10-CM | POA: Insufficient documentation

## 2018-12-29 DIAGNOSIS — J90 Pleural effusion, not elsewhere classified: Secondary | ICD-10-CM | POA: Diagnosis not present

## 2018-12-29 DIAGNOSIS — S0003XA Contusion of scalp, initial encounter: Secondary | ICD-10-CM | POA: Diagnosis not present

## 2018-12-29 DIAGNOSIS — S0011XA Contusion of right eyelid and periocular area, initial encounter: Secondary | ICD-10-CM | POA: Diagnosis not present

## 2018-12-29 DIAGNOSIS — E119 Type 2 diabetes mellitus without complications: Secondary | ICD-10-CM | POA: Diagnosis not present

## 2018-12-29 DIAGNOSIS — I5043 Acute on chronic combined systolic (congestive) and diastolic (congestive) heart failure: Secondary | ICD-10-CM | POA: Insufficient documentation

## 2018-12-29 DIAGNOSIS — S0511XA Contusion of eyeball and orbital tissues, right eye, initial encounter: Secondary | ICD-10-CM | POA: Diagnosis not present

## 2018-12-29 DIAGNOSIS — R55 Syncope and collapse: Secondary | ICD-10-CM | POA: Diagnosis not present

## 2018-12-29 DIAGNOSIS — H05231 Hemorrhage of right orbit: Secondary | ICD-10-CM

## 2018-12-29 LAB — URINALYSIS, COMPLETE (UACMP) WITH MICROSCOPIC
Bacteria, UA: NONE SEEN
Bilirubin Urine: NEGATIVE
Glucose, UA: NEGATIVE mg/dL
Hgb urine dipstick: NEGATIVE
Ketones, ur: NEGATIVE mg/dL
Leukocytes,Ua: NEGATIVE
Nitrite: NEGATIVE
Protein, ur: NEGATIVE mg/dL
Specific Gravity, Urine: 1.017 (ref 1.005–1.030)
Squamous Epithelial / HPF: NONE SEEN (ref 0–5)
pH: 5 (ref 5.0–8.0)

## 2018-12-29 LAB — COMPREHENSIVE METABOLIC PANEL
ALT: 21 U/L (ref 0–44)
AST: 27 U/L (ref 15–41)
Albumin: 4.5 g/dL (ref 3.5–5.0)
Alkaline Phosphatase: 35 U/L — ABNORMAL LOW (ref 38–126)
Anion gap: 7 (ref 5–15)
BUN: 29 mg/dL — ABNORMAL HIGH (ref 8–23)
CO2: 26 mmol/L (ref 22–32)
Calcium: 8.8 mg/dL — ABNORMAL LOW (ref 8.9–10.3)
Chloride: 105 mmol/L (ref 98–111)
Creatinine, Ser: 1.14 mg/dL (ref 0.61–1.24)
GFR calc Af Amer: >60 mL/min (ref >60)
GFR calc non Af Amer: 59 mL/min — ABNORMAL LOW (ref >60)
Glucose, Bld: 148 mg/dL — ABNORMAL HIGH (ref 70–99)
Potassium: 4.4 mmol/L (ref 3.5–5.1)
Sodium: 138 mmol/L (ref 135–145)
Total Bilirubin: 0.7 mg/dL (ref 0.3–1.2)
Total Protein: 7.7 g/dL (ref 6.5–8.1)

## 2018-12-29 LAB — CBC
HCT: 37.2 % — ABNORMAL LOW (ref 39.0–52.0)
Hemoglobin: 11.8 g/dL — ABNORMAL LOW (ref 13.0–17.0)
MCH: 32.2 pg (ref 26.0–34.0)
MCHC: 31.7 g/dL (ref 30.0–36.0)
MCV: 101.6 fL — ABNORMAL HIGH (ref 80.0–100.0)
Platelets: 284 10*3/uL (ref 150–400)
RBC: 3.66 MIL/uL — ABNORMAL LOW (ref 4.22–5.81)
RDW: 12.2 % (ref 11.5–15.5)
WBC: 13.1 10*3/uL — ABNORMAL HIGH (ref 4.0–10.5)
nRBC: 0 % (ref 0.0–0.2)

## 2018-12-29 LAB — TROPONIN I (HIGH SENSITIVITY)
Troponin I (High Sensitivity): 15 ng/L (ref <18)
Troponin I (High Sensitivity): 13 ng/L (ref <18)

## 2018-12-29 LAB — SARS CORONAVIRUS 2 BY RT PCR (HOSPITAL ORDER, PERFORMED IN ~~LOC~~ HOSPITAL LAB): SARS Coronavirus 2: NEGATIVE

## 2018-12-29 MED ORDER — HYDROCODONE-ACETAMINOPHEN 5-325 MG PO TABS: 1.0000 | ORAL_TABLET | Freq: Four times a day (QID) | ORAL | 0 refills | Status: DC | PRN

## 2018-12-29 NOTE — ED Provider Notes (Signed)
[facility] Emergency Department Provider Note   ____________________________________________   First MD Initiated Contact with Patient 12/29/18 [phone]     (approximate)  I have reviewed the triage vital signs and the nursing notes.   HISTORY  Chief Complaint Fall and Head Injury    HPI Jeffrey [name] is a 85 y.o. male who presents to the ED from home status post syncopal episode with facial injury.  Patient states he felt like his blood pressure was low, became dizzy and had a syncopal episode, striking his head and face.  Presents with laceration to his right eyebrow.  Takes Eliquis for atrial fibrillation.  Denies chest pain, shortness of breath, abdominal pain, nausea or vomiting.  Tetanus is up-to-date.       Past Medical History:  Diagnosis Date   Acute CVA (cerebrovascular accident) ([place])    Acute encephalopathy 11/27/2014   Acute on chronic combined systolic and diastolic CHF (congestive heart failure) ([place])    Aortic atherosclerosis ([place]) 11/25/2014   Atrial fibrillation (HCC)    Diabetes mellitus    Heart murmur    mild aortic stenosis   Hypercholesteremia    Hypertension    Impaired fasting glucose    Macular degeneration    Multiple lacunar infarcts ([place])    Obstructive sleep apnea 06/27/2018   Prostate cancer ([place]) radiation + seeding implant (Dr.[name])   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 01/15/2014    Patient Active Problem List   Diagnosis Date Noted   Obstructive sleep apnea 06/27/2018   Pulmonary hypertension ([place]) 06/27/2018   Long term current use of anticoagulant  05/28/2017   Type 2 diabetes mellitus with microalbuminuria, without long-term current use of insulin ([place]) 05/28/2017   H/O [facility] spotted fever 07/30/2015   Aortic valve stenosis 04/11/2015   Debility 02/04/2015   Cardiomyopathy ([place]) 01/09/2015   Paroxysmal atrial fibrillation ([place]) 12/24/2014   Stroke with cerebral ischemia ([place]) 12/03/2014   Pre-diabetes    Acute on chronic combined systolic and diastolic CHF (congestive heart failure) (HCC)    Acute CVA (cerebrovascular accident) ([place])    Left carotid artery stenosis 11/30/2014   Lacunar infarct, acute ([place])    Aortic atherosclerosis ([place]) 11/25/2014   DM (diabetes mellitus), type 2 with peripheral vascular complications ([place]) 90/24/0973   Awareness alteration, transient 12/26/2013   Low serum testosterone level 12/25/2013   TIA (transient ischemic attack) 10/10/2013   Radiation proctitis 11/28/2012   Colon cancer screening 11/28/2012   Murmur 02/23/2011   Type 2 diabetes mellitus with hypercholesterolemia ([place]) 02/05/2011   Essential hypertension, benign 11/26/2010   Impaired fasting glucose 11/26/2010   Pure hypercholesterolemia 11/26/2010   Prostate cancer ([place]) 11/26/2010    Past Surgical History:  Procedure Laterality Date   CATARACT EXTRACTION Right 01/2017   CIRCUMCISION  01/2017   [name]   COLONOSCOPY N/A 11/28/2012   Procedure: COLONOSCOPY;  Surgeon: Jerene Bears, MD;  Location: [facility] ENDOSCOPY;  Service: Gastroenterology;  Laterality: N/A;   INTERNAL URETHROTOMY  01/27/2016   WF   prostate seed implant     ROTATOR CUFF REPAIR  06/16/2011   right (Dr. Berenice Primas)  TONSILLECTOMY AND ADENOIDECTOMY  age 53    Prior to Admission medications   Medication Sig Start Date End Date Taking? Authorizing Provider  acetaminophen (TYLENOL ARTHRITIS PAIN) 650 MG CR tablet Take 1 tablet (650 mg total) by mouth every 8 (eight) hours as needed. 12/10/14   Love, Ivan Anchors, PA-C  amLODipine  (NORVASC) 5 MG tablet TAKE ONE TABLET (5 MG) BY MOUTH EVERY DAY 11/14/18   Wellington Hampshire, MD  betamethasone dipropionate (DIPROLENE) 0.05 % ointment Apply topically 2 (two) times daily.    [provider]  carvedilol (COREG) 6.25 MG tablet Take 1 tablet (6.25 mg total) by mouth 2 (two) times daily with a meal. 10/19/18   Arida, Mertie Clause, MD  ELIQUIS 5 MG TABS tablet TAKE ONE TABLET BY MOUTH TWICE DAILY 10/24/18   Wellington Hampshire, MD  HYDROcodone-acetaminophen (NORCO) 5-325 MG tablet Take 1 tablet by mouth every 6 (six) hours as needed for moderate pain. 12/29/18   Paulette Blanch, MD  levETIRAcetam (KEPPRA XR) 500 MG 24 hr tablet TAKE ONE TABLET EVERY DAY 08/16/18   Garvin Fila, MD  lisinopril (ZESTRIL) 40 MG tablet TAKE ONE TABLET BY MOUTH EVERY DAY 09/28/18   Wellington Hampshire, MD  loratadine (CLARITIN) 10 MG tablet Take by mouth.    [provider]  lovastatin (MEVACOR) 40 MG tablet TAKE TWO TABLETS AT BEDTIME 11/14/18   Rita Ohara, MD  Multiple Vitamins-Minerals (PRESERVISION AREDS) CAPS Take 1 capsule by mouth 2 (two) times daily.     [provider]  UNABLE TO FIND Bevacizumab(AVASTIN) chemo injection for the eye every 6 weeks    [provider]    Allergies Patient has no known allergies.  Family History  Problem Relation Age of Onset   Heart disease Father 38       Died suddenly.  No diagnosis   Stroke Sister    Dementia Mother    Diabetes Mother    Heart disease Mother    Hypertension Mother    Cancer Daughter        ovarian (?) vs other male cancer; s/p hyst doing well   Diabetes Daughter        GDM, and now AODM   Heart disease Son        congestive heart failure--improved   Congestive Heart Failure Grandchild     Social History Social History   Tobacco Use   Smoking status: Former Smoker    Packs/day: 1.00    Years: 20.00    Pack years: 20.00    Types: Cigarettes    Quit date: 05/31/1980    Years since quitting:  38.6   Smokeless tobacco: Never Used  Substance Use Topics   Alcohol use: Yes    Alcohol/week: 14.0 standard drinks    Types: 14 Cans of beer per week    Comment: 2 cans of beer most days of the week   Drug use: No    Review of Systems  Constitutional: No fever/chills Eyes: No visual changes. ENT: Positive for facial injuries.  No sore throat. Cardiovascular: Denies chest pain. Respiratory: Denies shortness of breath. Gastrointestinal: No abdominal pain.  No nausea, no vomiting.  No diarrhea.  No constipation. Genitourinary: Negative for dysuria. Musculoskeletal: Negative for back pain. Skin: Negative for rash. Neurological: Positive for dizziness and syncope.  Negative for headaches, focal weakness or numbness.   ____________________________________________   PHYSICAL EXAM:  VITAL SIGNS: ED Triage Vitals  Enc Vitals Group  BP 12/29/18 0211 (!) 153/79     Pulse Rate 12/29/18 0211 91     Resp 12/29/18 0211 17     Temp 12/29/18 0211 97.9 F (36.6 C)     Temp src --      SpO2 12/29/18 0211 98 %     Weight 12/29/18 0212 175 lb (79.4 kg)     Height 12/29/18 0212 5\' 11"  (1.803 m)     Head Circumference --      Peak Flow --      Pain Score 12/29/18 0211 0     Pain Loc --      Pain Edu? --      Excl. in [place]? --     Constitutional: Alert and oriented. Well appearing and in mild acute distress. Eyes: Conjunctivae are normal. PERRL. EOMI. Zigzag shaped skin tear above right eye with venous oozing. Head: Atraumatic. Nose: Mild swelling. Mouth/Throat: Mucous membranes are moist.  No dental malocclusion. Neck: No stridor.  No cervical spine tenderness to palpation. Cardiovascular: Normal rate, regular rhythm. Grossly normal heart sounds.  Good peripheral circulation. Respiratory: Normal respiratory effort.  No retractions. Lungs CTAB. Gastrointestinal: Soft and nontender to light or deep palpation. No distention. No abdominal bruits. No CVA  tenderness. Musculoskeletal: No lower extremity tenderness nor edema.  No joint effusions. Neurologic: Alert and oriented x3.   CN II-XII grossly intact.  Normal speech and language. No gross focal neurologic deficits are appreciated.  Skin:  Skin is warm, dry and intact. No rash noted. Psychiatric: Mood and affect are normal. Speech and behavior are normal.  ____________________________________________   LABS (all labs ordered are listed, but only abnormal results are displayed)  Labs Reviewed  CBC - Abnormal; Notable for the following components:      Result Value   WBC 13.1 (*)    RBC 3.66 (*)    Hemoglobin 11.8 (*)    HCT 37.2 (*)    MCV 101.6 (*)    All other components within normal limits  COMPREHENSIVE METABOLIC PANEL - Abnormal; Notable for the following components:   Glucose, Bld 148 (*)    BUN 29 (*)    Calcium 8.8 (*)    Alkaline Phosphatase 35 (*)    GFR calc non Af Amer 59 (*)    All other components within normal limits  URINALYSIS, COMPLETE (UACMP) WITH MICROSCOPIC - Abnormal; Notable for the following components:   Color, Urine YELLOW (*)    APPearance CLEAR (*)    All other components within normal limits  SARS CORONAVIRUS 2 (HOSPITAL ORDER, [place] [facility])  TROPONIN I (HIGH SENSITIVITY)  TROPONIN I (HIGH SENSITIVITY)   ____________________________________________  EKG  ED ECG REPORT I, Daina Cara J, the attending physician, personally viewed and interpreted this ECG.   Date: 12/29/2018  EKG Time: 0220  Rate: 86  Rhythm: normal EKG, normal sinus rhythm  Axis: RAD  Intervals:left bundle branch block  ST&T Change: Nonspecific  ____________________________________________  RADIOLOGY  ED MD interpretation: No acute traumatic injury of head/cervical spine/maxillofacial; no acute cardiopulmonary process  Official radiology report(s): Ct Head Wo Contrast  Result Date: 12/29/2018 CLINICAL DATA:  Dizzy with fall, laceration  EXAM: CT HEAD WITHOUT CONTRAST CT MAXILLOFACIAL WITHOUT CONTRAST CT CERVICAL SPINE WITHOUT CONTRAST TECHNIQUE: Multidetector CT imaging of the head, cervical spine, and maxillofacial structures were performed using the standard protocol without intravenous contrast. Multiplanar CT image reconstructions of the cervical spine and maxillofacial structures were also generated. COMPARISON:  MRI 06/14/2018,  CT head 04/28/2018 FINDINGS: CT HEAD FINDINGS Brain: No acute territorial infarction, hemorrhage or intracranial mass. Moderate-to-marked atrophy. Moderate hypodensity in the white matter consistent with small vessel ischemic disease. Chronic lacunar infarct versus dilated perivascular space in the left basal ganglia. Stable ventricle size. Vascular: No hyperdense vessels.  Carotid vascular calcification Skull: Normal. Negative for fracture or focal lesion. Other: Large anterior scalp hematoma and laceration. CT MAXILLOFACIAL FINDINGS Osseous: No acute nasal bone fracture. Pterygoid plates and zygomatic arches are intact. No mandibular fracture. Mandibular heads are normally position. Orbits: Negative. No traumatic or inflammatory finding. Sinuses: Clear. Soft tissues: Large forehead scalp hematoma CT CERVICAL SPINE FINDINGS Alignment: Straightening of the cervical spine. No subluxation. Facet alignment normal Skull base and vertebrae: No acute fracture. No primary bone lesion or focal pathologic process. Soft tissues and spinal canal: No prevertebral fluid or swelling. No visible canal hematoma. Disc levels:  Mild degenerative changes C5-C6 and C6-C7 Upper chest: Right apical scarring Other: None IMPRESSION: 1. No CT evidence for acute intracranial abnormality. Atrophy and small vessel ischemic changes of the white matter 2. Straightening of the cervical spine without acute osseous abnormality 3. No acute facial bone fracture 4. Large forehead scalp hematoma Electronically Signed   By: Donavan Foil M.D.   On:  12/29/2018 03:23   Ct Cervical Spine Wo Contrast  Result Date: 12/29/2018 CLINICAL DATA:  Dizzy with fall, laceration EXAM: CT HEAD WITHOUT CONTRAST CT MAXILLOFACIAL WITHOUT CONTRAST CT CERVICAL SPINE WITHOUT CONTRAST TECHNIQUE: Multidetector CT imaging of the head, cervical spine, and maxillofacial structures were performed using the standard protocol without intravenous contrast. Multiplanar CT image reconstructions of the cervical spine and maxillofacial structures were also generated. COMPARISON:  MRI 06/14/2018, CT head 04/28/2018 FINDINGS: CT HEAD FINDINGS Brain: No acute territorial infarction, hemorrhage or intracranial mass. Moderate-to-marked atrophy. Moderate hypodensity in the white matter consistent with small vessel ischemic disease. Chronic lacunar infarct versus dilated perivascular space in the left basal ganglia. Stable ventricle size. Vascular: No hyperdense vessels.  Carotid vascular calcification Skull: Normal. Negative for fracture or focal lesion. Other: Large anterior scalp hematoma and laceration. CT MAXILLOFACIAL FINDINGS Osseous: No acute nasal bone fracture. Pterygoid plates and zygomatic arches are intact. No mandibular fracture. Mandibular heads are normally position. Orbits: Negative. No traumatic or inflammatory finding. Sinuses: Clear. Soft tissues: Large forehead scalp hematoma CT CERVICAL SPINE FINDINGS Alignment: Straightening of the cervical spine. No subluxation. Facet alignment normal Skull base and vertebrae: No acute fracture. No primary bone lesion or focal pathologic process. Soft tissues and spinal canal: No prevertebral fluid or swelling. No visible canal hematoma. Disc levels:  Mild degenerative changes C5-C6 and C6-C7 Upper chest: Right apical scarring Other: None IMPRESSION: 1. No CT evidence for acute intracranial abnormality. Atrophy and small vessel ischemic changes of the white matter 2. Straightening of the cervical spine without acute osseous abnormality 3.  No acute facial bone fracture 4. Large forehead scalp hematoma Electronically Signed   By: Donavan Foil M.D.   On: 12/29/2018 03:23   Dg Chest Port 1 View  Result Date: 12/29/2018 CLINICAL DATA:  Dizziness, fall EXAM: PORTABLE CHEST 1 VIEW COMPARISON:  06/08/2018 FINDINGS: Lungs are clear.  No pleural effusion or pneumothorax. The heart is normal in size. Thoracic aortic atherosclerosis. Degenerative changes of the right shoulder, chronic. IMPRESSION: No evidence of acute cardiopulmonary disease. Thoracic aortic atherosclerosis. Electronically Signed   By: Julian Hy M.D.   On: 12/29/2018 03:18   Ct Maxillofacial Wo Cm  Result Date: 12/29/2018 CLINICAL  DATA:  Dizzy with fall, laceration EXAM: CT HEAD WITHOUT CONTRAST CT MAXILLOFACIAL WITHOUT CONTRAST CT CERVICAL SPINE WITHOUT CONTRAST TECHNIQUE: Multidetector CT imaging of the head, cervical spine, and maxillofacial structures were performed using the standard protocol without intravenous contrast. Multiplanar CT image reconstructions of the cervical spine and maxillofacial structures were also generated. COMPARISON:  MRI 06/14/2018, CT head 04/28/2018 FINDINGS: CT HEAD FINDINGS Brain: No acute territorial infarction, hemorrhage or intracranial mass. Moderate-to-marked atrophy. Moderate hypodensity in the white matter consistent with small vessel ischemic disease. Chronic lacunar infarct versus dilated perivascular space in the left basal ganglia. Stable ventricle size. Vascular: No hyperdense vessels.  Carotid vascular calcification Skull: Normal. Negative for fracture or focal lesion. Other: Large anterior scalp hematoma and laceration. CT MAXILLOFACIAL FINDINGS Osseous: No acute nasal bone fracture. Pterygoid plates and zygomatic arches are intact. No mandibular fracture. Mandibular heads are normally position. Orbits: Negative. No traumatic or inflammatory finding. Sinuses: Clear. Soft tissues: Large forehead scalp hematoma CT CERVICAL SPINE  FINDINGS Alignment: Straightening of the cervical spine. No subluxation. Facet alignment normal Skull base and vertebrae: No acute fracture. No primary bone lesion or focal pathologic process. Soft tissues and spinal canal: No prevertebral fluid or swelling. No visible canal hematoma. Disc levels:  Mild degenerative changes C5-C6 and C6-C7 Upper chest: Right apical scarring Other: None IMPRESSION: 1. No CT evidence for acute intracranial abnormality. Atrophy and small vessel ischemic changes of the white matter 2. Straightening of the cervical spine without acute osseous abnormality 3. No acute facial bone fracture 4. Large forehead scalp hematoma Electronically Signed   By: Donavan Foil M.D.   On: 12/29/2018 03:23    ____________________________________________   PROCEDURES  Procedure(s) performed (including Critical Care):  Procedures   ____________________________________________   INITIAL IMPRESSION / ASSESSMENT AND PLAN / ED COURSE  As part of my medical decision making, I reviewed the following data within the [place] History obtained from family, Nursing notes reviewed and incorporated, Labs reviewed, EKG interpreted, Old chart reviewed, Radiograph reviewed and Notes from prior ED visits     Jeffrey [name] was evaluated in Emergency Department on 12/29/2018 for the symptoms described in the history of present illness. He was evaluated in the context of the global COVID-19 pandemic, which necessitated consideration that the patient might be at risk for infection with the SARS-CoV-2 virus that causes COVID-19. Institutional protocols and algorithms that pertain to the evaluation of patients at risk for COVID-19 are in a state of rapid change based on information released by regulatory bodies including the CDC and federal and state organizations. These policies and algorithms were followed during the patient's care in the ED.   85 year old male who presents status post  syncopal episode with facial injury.  Differential diagnosis includes but is not limited to ACS, orthostasis, ICH, CVA, etc.  Will obtain cardiac labs, CT head/C-spine/maxillofacial.  Discussed with patient and spouse risks of attempting suture in this delicate area with patient on Eliquis would probably make the bleeding worse.  Surgicel and pressure dressing applied to skin tear above right eye.  Ice applied over dressing.  Will reassess.   Clinical Course as of Dec 29 547  Fri Dec 29, 2018  0348 Updated patient and spouse of all test results. Strongly recommend hospitalization but patient declines.  I stressed the importance of hospital monitoring for repeat troponins as well as neurological checks as there is a risk of delayed brain bleed given patient is on Eliquis.  I did emphasize  my concern of permanent cardiac, neurologic complications or even death if patient went home.  He verbally understands this.  He does agree to stay for repeat troponin.  Ice pack reapplied.  Right eye slightly more swollen and now left upper eyelid is also edematous.   [JS]  [id number] Updated patient and spouse repeat troponin result.  Patient voices no complaints.  Adamantly desires to go home.  Has an appointment with his cardiologist [name] in the next few days.  Pressure dressing unwrapped.  There is no active bleeding currently.  Surgicel remains in place.  Instructed wife to moisten Surgicel and approximately 12 hours to remove and re-bandaged with nonadherent dressing which was provided to her.  Asked patient to hold Eliquis x2 doses.  Discussed with skin tears over right eye will heal by secondary intention and the risks of attempting sutures would have caused more bleeding initially.  Strict return precautions given.  Both verbalized understanding agree with plan of care.   [JS]    Clinical Course User Index [JS] Paulette Blanch, MD     ____________________________________________   FINAL CLINICAL  IMPRESSION(S) / ED DIAGNOSES  Final diagnoses:  Syncope, unspecified syncope type  Injury of head, initial encounter  Periorbital hematoma of right eye     ED Discharge Orders         Ordered    HYDROcodone-acetaminophen (NORCO) 5-325 MG tablet  Every 6 hours PRN     12/29/18 0517           Note:  This document was prepared using Dragon voice recognition software and may include unintentional dictation errors.   Paulette Blanch, MD 12/29/18 [phone]

## 2018-12-29 NOTE — ED Notes (Signed)
Patient's wife reports patient has "spells where he blacks out for a few seconds. I can usually catch him before he falls".

## 2018-12-29 NOTE — Discharge Instructions (Signed)
You may take Tylenol as needed for pain, Norco as needed for more severe pain. Apply ice to affected area several times daily. Return to the ER for worsening symptoms, persistent vomiting, lethargy, chest pain or other concerns.

## 2018-12-29 NOTE — ED Triage Notes (Signed)
Patient reports he became dizzy and fell striking head/face. Swelling to nose seen. Laceration proximal to right eye. Patient is on eliquis.

## 2019-01-01 NOTE — Progress Notes (Signed)
Cardiology Office Note    Date:  01/02/2019   ID:  Jeffrey [name] 1932/10/05, MRN [id number]  PCP:  Rita Ohara, MD  Cardiologist:  Kathlyn Sacramento, MD  Electrophysiologist:  None   Chief Complaint: ED follow-up  History of Present Illness:   Jeffrey [name] is a 85 y.o. male with history of PAF on Eliquis, pulmonary hypertension, TIA/stroke, mild to moderate aortic stenosis,  seizure disorder, diabetes, HTN, HLD, moderate left carotid artery stenosis, anemia, sleep apnea on CPAP, and prostate cancer status post radiation who presents for ED follow-up of possible syncope.  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 He underwent nuclear stress testing in 12/2014 that showed no evidence of significant ischemia with a normal EF. Echo in 07/2016 showed normal LVSF, mild aortic stenosis with a mean gradient of 15 mmHg, and no significant pulmonary hypertension.  Most recent echo in 03/2018 showed normal LV systolic function with an EF of 55 to 60%, normal wall motion, normal LV diastolic function, mild to moderate aortic stenosis with a mean gradient of 21 mmHg and valve area of 0.94 cm, mildly dilated left atrium, normal RV systolic function, PASP 56 mmHg.  Over the years, the patient has had issues with severely elevated blood pressure requiring transition from metoprolol to carvedilol.  There also appears to be a component of whitecoat hypertension.  In the setting of the patient's elevated pulmonary pressures he was referred to pulmonology and found to have sleep apnea.  He has been compliant with CPAP.  Patient was seen in telehealth follow-up on 10/19/2018 and was doing well.  His main complaint was nocturia which was affecting his quality of sleep.  More recently, the patient was seen in the ED on 12/29/2018 with possible syncopal episode.  Patient indicated he felt like his blood pressure was low, became dizzy, and suffered a syncopal episode  striking his head and face.  Imaging showed no CT evidence for acute intracranial abnormality with atrophy and small vessel ischemic changes of white matter noted.  No acute cervical spine injury or facial bone fracture.  EKG showed sinus rhythm with known right bundle.  Chest x-ray showed no evidence of cardiopulmonary disease with thoracic aortic atherosclerosis noted.  High-sensitivity troponin negative x2.  COVID-19 negative.  Potassium 4.4, serum creatinine 1.14, glucose 148, albumin 4.5, AST/ALT normal, WBC 13.1, Hgb 11.8, PLT 284.  Blood pressure in the ED 153/79.  Patient was advised to hold Eliquis x2 doses by ED given facial laceration.  Patient comes in stating his blood pressure is typically well controlled first in the morning with readings in the 726-203 systolic range over 55H diastolic.  He states after this he will take his morning amlodipine and carvedilol with continued notation of lower blood pressures as the day progresses, frequently in the 74B systolic by the evening hours.  In this setting, the patient notes increased positional dizziness in the afternoon and evening hours.  He states he drinks 2-3 beers daily coupled with a glass of orange juice in the morning and otherwise does not drink any other fluids.  He particularly states he does not drink water.  He has not had any further syncopal episodes since his ED visit.  In talking with the patient and his wife this morning about this episode, the patient states he ambulated to the restroom around 8 PM on the day of 12/29/2018 and prior to voiding suffered a sudden  syncopal episode.  Patient denies any preceding warning symptoms.  Patient hit the right side of his head on the tile floor.  Patient's wife is uncertain how long the patient was without consciousness though states it may have been 2 minutes.  There was no loss of bowel or bladder function.  No seizure-like activity.  Upon coming to, the patient denied any chest pain or  palpitations.  He has continued to hold Eliquis since his ED visit on 7/31.  He denies any BRBPR or melena.  He has a scheduled carotid artery ultrasound for later this month in [place].  He denies any chest pain, shortness of breath, palpitations, lower extremity swelling, abdominal tension, orthopnea, PND, early satiety.  Past Medical History:  Diagnosis Date  . Acute CVA (cerebrovascular accident) ([place])   . Acute encephalopathy 11/27/2014  . Acute on chronic combined systolic and diastolic CHF (congestive heart failure) ([place])   . Aortic atherosclerosis ([place]) 11/25/2014  . Atrial fibrillation ([place])   . Diabetes mellitus   . Heart murmur    mild aortic stenosis  . Hypercholesteremia   . Hypertension   . Impaired fasting glucose   . Macular degeneration   . Multiple lacunar infarcts ([place])   . Obstructive sleep apnea 06/27/2018  . Prostate cancer ([place]) radiation + seeding implant (Dr.[name])  . 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 Hilarie Fredrickson)  . 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 Rotator cuff tear, right 10/2010   supraspinatous and infraspinatous  . TIA (transient ischemic attack) 10/02/13   hosp at [facility] x 1 night  . 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 CATARACT EXTRACTION Right 01/2017  . CIRCUMCISION  01/2017   [name]  . COLONOSCOPY N/A 11/28/2012   Procedure: COLONOSCOPY;  Surgeon: Jerene Bears, MD;  Location: [facility] ENDOSCOPY;  Service: Gastroenterology;  Laterality: N/A;  . INTERNAL URETHROTOMY  01/27/2016   [facility]  . prostate seed implant    . ROTATOR CUFF REPAIR  06/16/2011   right (Dr. Berenice Primas)  . TONSILLECTOMY AND ADENOIDECTOMY  age 19    Current Medications: Current Meds  Medication Sig  . acetaminophen (TYLENOL ARTHRITIS PAIN) 650 MG CR tablet Take 1 tablet (650 mg total) by mouth every 8  (eight) hours as needed.  [name] amLODipine (NORVASC) 5 MG tablet TAKE ONE TABLET (5 MG) BY MOUTH EVERY DAY  . betamethasone dipropionate (DIPROLENE) 0.05 % ointment Apply topically 2 (two) times daily.  . carvedilol (COREG) 6.25 MG tablet Take 1 tablet (6.25 mg total) by mouth 2 (two) times daily with a meal.  . ELIQUIS 5 MG TABS tablet TAKE ONE TABLET BY MOUTH TWICE DAILY  . HYDROcodone-acetaminophen (NORCO) 5-325 MG tablet Take 1 tablet by mouth every 6 (six) hours as needed for moderate pain.  [name] levETIRAcetam (KEPPRA XR) 500 MG 24 hr tablet TAKE ONE TABLET EVERY DAY  . lisinopril (ZESTRIL) 40 MG tablet TAKE ONE TABLET BY MOUTH EVERY DAY  . loratadine (CLARITIN) 10 MG tablet Take by mouth.  . lovastatin (MEVACOR) 40 MG tablet TAKE TWO TABLETS AT BEDTIME  . Multiple Vitamins-Minerals (PRESERVISION AREDS) CAPS Take 1 capsule by mouth 2 (two) times daily.   [name] UNABLE TO FIND Bevacizumab(AVASTIN) chemo injection for the eye every 6 weeks    Allergies:   Patient has no known allergies.   Social History  Socioeconomic History  . Marital status: Married    Spouse name: Not on file  . Number of children: 4  . Years of education: Not on file  . Highest education level: Not on file  Occupational History  . Occupation: Retired  Scientific laboratory technician  . Financial resource strain: Not on file  . Food insecurity    Worry: Not on file    Inability: Not on file  . Transportation needs    Medical: Not on file    Non-medical: Not on file  Tobacco Use  . Smoking status: Former Smoker    Packs/day: 1.00    Years: 20.00    Pack years: 20.00    Types: Cigarettes    Quit date: 05/31/1980    Years since quitting: 38.6  . Smokeless tobacco: Never Used  Substance and Sexual Activity  . Alcohol use: Yes    Alcohol/week: 14.0 standard drinks    Types: 14 Cans of beer per week    Comment: 2 cans of beer most days of the week  . Drug use: No  . Sexual activity: Not Currently    Partners: Female    Comment:  issues with ED  Lifestyle  . Physical activity    Days per week: Not on file    Minutes per session: Not on file  . Stress: Not on file  Relationships  . Social Herbalist on phone: Not on file    Gets together: Not on file    Attends religious service: Not on file    Active member of club or organization: Not on file    Attends meetings of clubs or organizations: Not on file    Relationship status: Not on file  Other Topics Concern  . Not on file  Social History Narrative   Married. Retired Engineer, structural.  2 sons, 2 daughters (all in [place]), 15 grandchildren, 7 great grandchild, 2 more expected     Family History:  The patient's family history includes Cancer in his daughter; Congestive Heart Failure in his grandchild; Dementia in his mother; Diabetes in his daughter and mother; Heart disease in his mother and son; Heart disease (age of onset: 110) in his father; Hypertension in his mother; Stroke in his sister.  ROS:   Review of Systems  Constitutional: Positive for malaise/fatigue. Negative for chills, diaphoresis, fever and weight loss.  HENT: Negative for congestion.   Eyes: Negative for discharge and redness.  Respiratory: Negative for cough, hemoptysis, sputum production, shortness of breath and wheezing.   Cardiovascular: Negative for chest pain, palpitations, orthopnea, claudication, leg swelling and PND.  Gastrointestinal: Negative for abdominal pain, blood in stool, heartburn, melena, nausea and vomiting.  Genitourinary: Negative for hematuria.  Musculoskeletal: Positive for falls. Negative for myalgias.  Skin: Negative for rash.  Neurological: Positive for dizziness, loss of consciousness and weakness. Negative for tingling, tremors, sensory change, speech change and focal weakness.  Endo/Heme/Allergies: Bruises/bleeds easily.  Psychiatric/Behavioral: Negative for substance abuse. The patient is not nervous/anxious.   All other systems reviewed and are  negative.    EKGs/Labs/Other Studies Reviewed:    Studies reviewed were summarized above. The additional studies were reviewed today:  2D Echo 03/2018: - Left ventricle: The cavity size was normal. There was mild   concentric hypertrophy. Systolic function was normal. The   estimated ejection fraction was in the range of 55% to 60%. Wall   motion was normal; there were no regional wall motion   abnormalities. Left  ventricular diastolic function parameters   were normal. - Aortic valve: There was mild to moderate stenosis by mean   gradient and peak velocity, moderate by estimated AVA. Peak   velocity (S): 290 cm/s. Mean gradient (S): 21 mm Hg. Valve area   (VTI): 0.94 cm^2. - Left atrium: The atrium was mildly dilated. - Right ventricle: Systolic function was normal. - Pulmonary arteries: Systolic pressure was moderately elevated PA   peak pressure: 56 mm Hg (S). __________  Orthostatic vital signs:  Lying: 180/76, 65 bpm Sitting: 157/76, 64 bpm Standing: 122/72, 76 bpm Standing x3 minutes: 122/69, 85 bpm  EKG:  EKG is ordered today.  The EKG ordered today demonstrates NSR, 69 bpm, PACs, RBBB which was previously known  Recent Labs: 07/05/2018: TSH 1.490 12/29/2018: ALT 21; BUN 29; Creatinine, Ser 1.14; Hemoglobin 11.8; Platelets 284; Potassium 4.4; Sodium 138  Recent Lipid Panel    Component Value Date/Time   CHOL 152 07/05/2018 0910   CHOL 184 10/01/2013 1719   TRIG 133 07/05/2018 0910   TRIG 400 (H) 10/01/2013 1719   HDL 59 07/05/2018 0910   HDL 55 10/01/2013 1719   CHOLHDL 2.6 07/05/2018 0910   CHOLHDL 2.3 05/27/2017 1007   VLDL 28 06/02/2016 0714   VLDL 80 (H) 10/01/2013 1719   LDLCALC 66 07/05/2018 0910   LDLCALC 78 05/27/2017 1007   LDLCALC 49 10/01/2013 1719    PHYSICAL EXAM:    VS:  BP (!) 180/92 (BP Location: Right Arm, Patient Position: Sitting, Cuff Size: Normal)   Pulse 69   Ht 5\' 11"  (1.803 m)   Wt 169 lb 8 oz (76.9 kg)   BMI 23.64 kg/m   BMI:  Body mass index is 23.64 kg/m.  Physical Exam  Constitutional: He is oriented to person, place, and time. He appears well-developed and well-nourished.    HENT:  Head: Normocephalic and atraumatic.  Eyes: Right eye exhibits no discharge. Left eye exhibits no discharge.  Neck: Normal range of motion. No JVD present.  Cardiovascular: Normal rate, regular rhythm, S1 normal and S2 normal. Exam reveals no distant heart sounds, no friction rub, no midsystolic click and no opening snap.  Murmur heard.  Harsh midsystolic murmur is present with a grade of 2/6 at the upper right sternal border radiating to the neck. Pulses:      Posterior tibial pulses are 2+ on the right side and 2+ on the left side.  Pulmonary/Chest: Effort normal and breath sounds normal. No respiratory distress. He has no decreased breath sounds. He has no wheezes. He has no rales. He exhibits no tenderness.  Abdominal: Soft. He exhibits no distension. There is no abdominal tenderness.  Musculoskeletal:        General: No edema.  Neurological: He is alert and oriented to person, place, and time.  Skin: Skin is warm and dry. No cyanosis. Nails show no clubbing.  Psychiatric: He has a normal mood and affect. His speech is normal and behavior is normal. Judgment and thought content normal.    Wt Readings from Last 3 Encounters:  01/02/19 169 lb 8 oz (76.9 kg)  12/29/18 175 lb (79.4 kg)  12/27/18 173 lb (78.5 kg)     ASSESSMENT & PLAN:   1. Syncope with orthostatic hypotension: Patient is significantly orthostatic in the office today.  He drinks minimal amounts of fluid during the day with most fluids consisting of beer.  I have advised the patient to increase his water intake and decrease beer consumption.  We will also decrease his p.m. dose of carvedilol to 3.125 mg.  Otherwise, he will remain on amlodipine 5 mg daily and Coreg 6.25 mg in the morning as well as lisinopril 40 mg daily.  We may need to consider decreasing his  antihypertensive therapy further if he remains persistently orthostatic.  Check echo and carotid artery ultrasound as below.  Cannot exclude potential posttermination pause if the patient had been in A. fib at the time of his syncopal episode.  In this setting, schedule ZIO.  2. PAF: Maintaining sinus rhythm with frequent PACs on twelve-lead EKG today.  He has not yet resumed Eliquis following his fall/syncopal episode on 7/31.  I have advised him to go ahead and resume Eliquis with close monitoring for return of bleeding from the wound above the right eye.  Continue Coreg as outlined above.  Check CO as above.  3. Moderate aortic stenosis: Schedule echocardiogram to trend aortic stenosis.  4. Pulmonary hypertension: Patient appears well compensated.  Likely in the setting of underlying sleep apnea.  Follow-up with pulmonology as directed.  5. Carotid artery stenosis: Most recent carotid artery ultrasound from 0/0938 demonstrated LICA 40 to 18% stenosis with calcified plaque noted possibly obscuring high velocities, R ICA 1 to 39% stenosis with antegrade flow of the bilateral vertebral arteries and normal flow hemodynamics of the bilateral subclavian arteries.  Patient states he has a scheduled to have carotid artery ultrasound in [place] later this month.  He will fax [place] the results.  6. History of stroke: On Eliquis in place of aspirin.  Most recent LDL at goal as below.  Remains on lovastatin.  7. Hypertension: Initial blood pressure elevated at 180/92 though patient was significantly orthostatic in the office today.  As above.  8. OSA: Continue CPAP.  9. Hyperlipidemia: LDL of 66 from 07/2018.  Remains on lovastatin.  Followed by PCP.  Disposition: F/u with [name] or an APP in 2 months.   Medication Adjustments/Labs and Tests Ordered: Current medicines are reviewed at length with the patient today.  Concerns regarding medicines are outlined above. Medication changes, Labs and Tests  ordered today are summarized above and listed in the Patient Instructions accessible in Encounters.   Signed, Christell Faith, PA-C 01/02/2019 10:10 AM     [facility] [address] [place] [address] [place], [place] [address] [phone]

## 2019-01-02 ENCOUNTER — Other Ambulatory Visit: Payer: Self-pay

## 2019-01-02 ENCOUNTER — Encounter: Payer: Self-pay | Admitting: Physician Assistant

## 2019-01-02 ENCOUNTER — Ambulatory Visit (INDEPENDENT_AMBULATORY_CARE_PROVIDER_SITE_OTHER): Payer: Medicare Other

## 2019-01-02 ENCOUNTER — Ambulatory Visit (INDEPENDENT_AMBULATORY_CARE_PROVIDER_SITE_OTHER): Payer: Medicare Other | Admitting: Physician Assistant

## 2019-01-02 VITALS — BP 180/92 | HR 69 | Ht 71.0 in | Wt 169.5 lb

## 2019-01-02 DIAGNOSIS — E785 Hyperlipidemia, unspecified: Secondary | ICD-10-CM | POA: Diagnosis not present

## 2019-01-02 DIAGNOSIS — Z8673 Personal history of transient ischemic attack (TIA), and cerebral infarction without residual deficits: Secondary | ICD-10-CM | POA: Diagnosis not present

## 2019-01-02 DIAGNOSIS — I272 Pulmonary hypertension, unspecified: Secondary | ICD-10-CM

## 2019-01-02 DIAGNOSIS — I350 Nonrheumatic aortic (valve) stenosis: Secondary | ICD-10-CM

## 2019-01-02 DIAGNOSIS — I10 Essential (primary) hypertension: Secondary | ICD-10-CM | POA: Diagnosis not present

## 2019-01-02 DIAGNOSIS — I480 Paroxysmal atrial fibrillation: Secondary | ICD-10-CM | POA: Diagnosis not present

## 2019-01-02 DIAGNOSIS — G4730 Sleep apnea, unspecified: Secondary | ICD-10-CM

## 2019-01-02 DIAGNOSIS — R55 Syncope and collapse: Secondary | ICD-10-CM

## 2019-01-02 DIAGNOSIS — I739 Peripheral vascular disease, unspecified: Secondary | ICD-10-CM

## 2019-01-02 DIAGNOSIS — I779 Disorder of arteries and arterioles, unspecified: Secondary | ICD-10-CM

## 2019-01-02 DIAGNOSIS — I6381 Other cerebral infarction due to occlusion or stenosis of small artery: Secondary | ICD-10-CM | POA: Diagnosis not present

## 2019-01-02 DIAGNOSIS — I951 Orthostatic hypotension: Secondary | ICD-10-CM | POA: Diagnosis not present

## 2019-01-02 MED ORDER — CARVEDILOL 6.25 MG PO TABS: ORAL_TABLET | ORAL | 3 refills | Status: DC

## 2019-01-02 NOTE — Addendum Note (Signed)
Addended by: Verlon Au on: 01/02/2019 01:44 PM   Modules accepted: Orders

## 2019-01-02 NOTE — Patient Instructions (Signed)
Medication Instructions:  Your physician has recommended you make the following change in your medication:  1- DECREASE Amlodipine in the evening. Take 1 tablet (6.25 mg total) in the morning and 0.5 tablet (3.125 mg total ) in the evening. If you need a refill on your cardiac medications before your next appointment, please call your pharmacy.   Lab work: None ordered  If you have labs (blood work) drawn today and your tests are completely normal, you will receive your results only by: [name] MyChart Message (if you have MyChart) OR . A paper copy in the mail If you have any lab test that is abnormal or we need to change your treatment, we will call you to review the results.  Testing/Procedures: 1- Echo  Please return to [facility] on ______________ at _______________ AM/PM for an Echocardiogram. Your physician has requested that you have an echocardiogram. Echocardiography is a painless test that uses sound waves to create images of your heart. It provides your doctor with information about the size and shape of your heart and how well your heart's chambers and valves are working. This procedure takes approximately one hour. There are no restrictions for this procedure. Please note; depending on visual quality an IV may need to be placed.   2-  Zio AT: A zio monitor was placed today. It will remain on for 14 days. You will then return monitor and event diary in provided box. It takes 1-2 weeks for report to be downloaded and returned to [place]. We will call you with the results. If monitor falls of or has orange flashing light, please call Zio for further instructions.     Follow-Up: At [facility], you and your health needs are our priority.  As part of our continuing mission to provide you with exceptional heart care, we have created designated Provider Care Teams.  These Care Teams include your primary Cardiologist (physician) and Advanced Practice Providers (APPs -  Physician  Assistants and Nurse Practitioners) who all work together to provide you with the care you need, when you need it. You will need a follow up appointment in 2 months. You may see Kathlyn Sacramento, MD or  . Christell Faith, PA-C.   Any Other Special Instructions Will Be Listed Below (If Applicable). - INCREASE your daily water intake.

## 2019-01-08 ENCOUNTER — Other Ambulatory Visit: Payer: Self-pay | Admitting: Cardiovascular Disease

## 2019-01-18 DIAGNOSIS — H353124 Nonexudative age-related macular degeneration, left eye, advanced atrophic with subfoveal involvement: Secondary | ICD-10-CM | POA: Diagnosis not present

## 2019-01-18 DIAGNOSIS — H353211 Exudative age-related macular degeneration, right eye, with active choroidal neovascularization: Secondary | ICD-10-CM | POA: Diagnosis not present

## 2019-01-22 ENCOUNTER — Other Ambulatory Visit: Payer: Self-pay

## 2019-01-22 ENCOUNTER — Ambulatory Visit (INDEPENDENT_AMBULATORY_CARE_PROVIDER_SITE_OTHER): Payer: Medicare Other

## 2019-01-22 DIAGNOSIS — R55 Syncope and collapse: Secondary | ICD-10-CM | POA: Diagnosis not present

## 2019-01-23 IMAGING — CR DG CHEST 2V
2 series · 2 of 2 positions shown · non-contrast
Comparison: 12/06/2014

CLINICAL DATA: Pulmonary hypertension.  Former smoker.

EXAM:
CHEST - 2 VIEW

[chest pa]
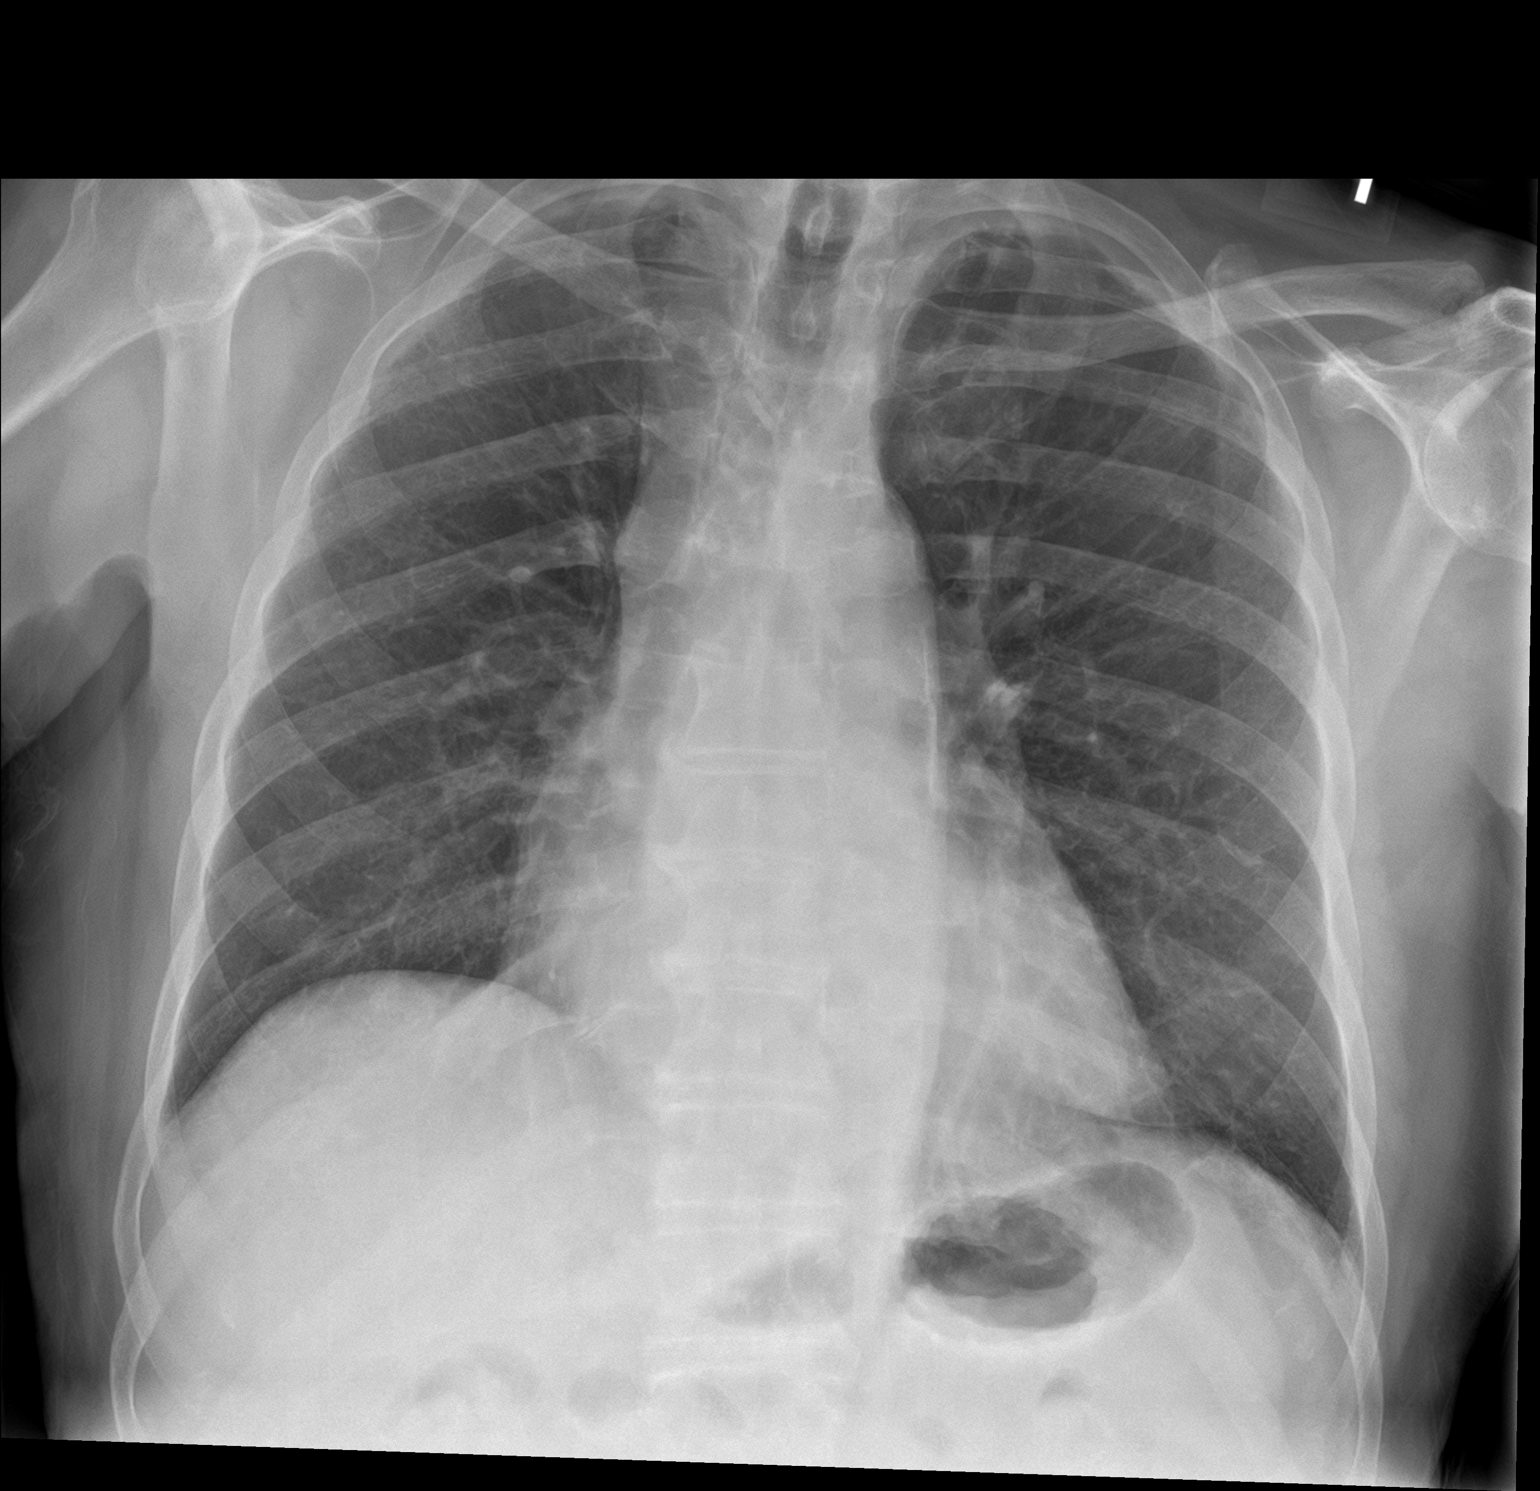

[chest lat]
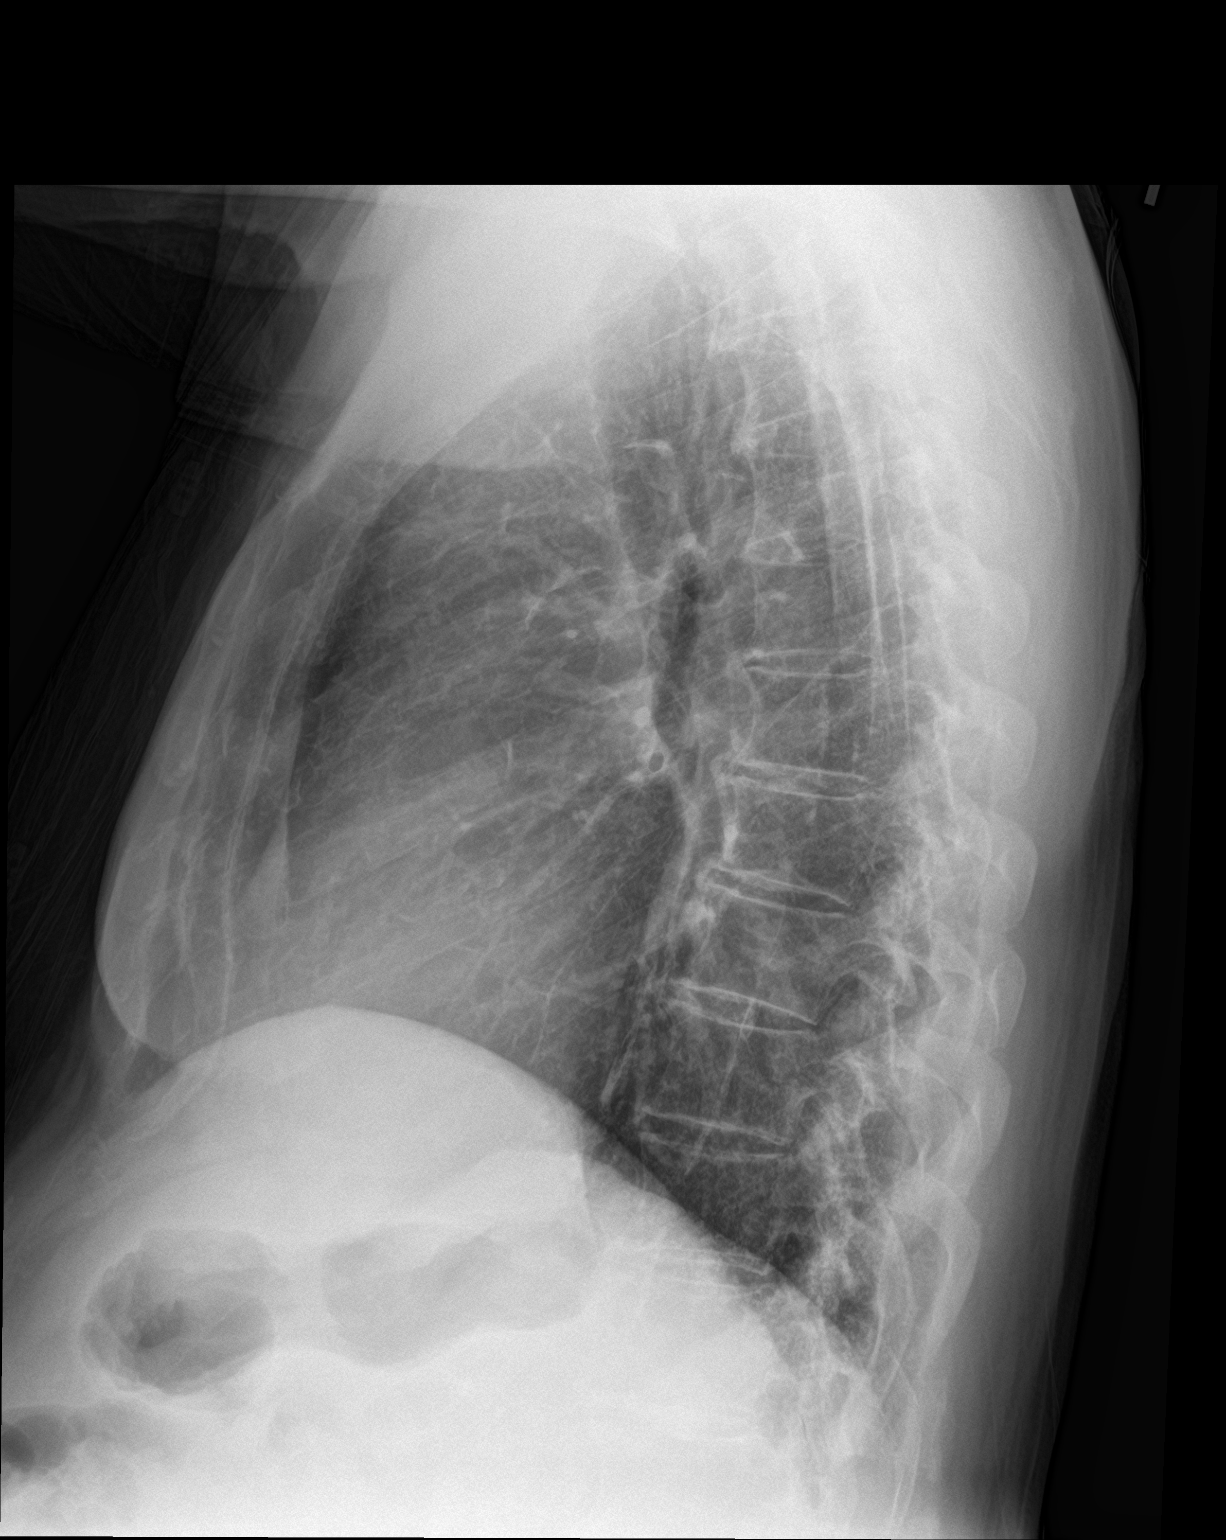

[2 of 2 positions shown; findings below may reference images not displayed]

FINDINGS: Lungs are adequately inflated and otherwise clear. Cardiomediastinal
silhouette and remainder the exam is unchanged.
IMPRESSION: No active cardiopulmonary disease.

## 2019-01-31 ENCOUNTER — Telehealth: Payer: Self-pay

## 2019-01-31 MED ORDER — CARVEDILOL 6.25 MG PO TABS: 6.2500 mg | ORAL_TABLET | Freq: Two times a day (BID) | ORAL | 3 refills | Status: DC

## 2019-01-31 NOTE — Telephone Encounter (Signed)
Spoke to patient to discuss heart monitor results. Discussed medication changes and orders placed.   Advised pt to call for any further questions or concerns.

## 2019-01-31 NOTE — Telephone Encounter (Signed)
-----   Message from Rise Mu, PA-C sent at 01/31/2019 12:38 PM EDT ----- Heart monitor showed NSR with an average rate of 71 bpm.  2 runs of NSVT with the longest lasting 17 beats.  27 runs of SVT with the longest lasting 14 beats.  Occasional PACs/PVCs. No significant pauses or high-grade AV block.   Recommendations: Patient was noted to be significantly orthostatic at his last office visit leading to the decreasing of his Coreg. With the above ectopy, we should look to escalate his beta blocker. To do this, we will need to stop his amlodipine and lisinopril. Increase Coreg to 6.25 mg bid (was taking 6.25 mg in the morning and 3.125 mg in the evening). When he follows up next month, if BP allows we can titrate Coreg further or transition him to Toprol XL. Recent echo demonstrated preserved EF. Consider ischemic evaluation in follow up given ventricular ectopy. Continue to remain well hydrated with water and minimize alcohol consumption.

## 2019-02-07 ENCOUNTER — Encounter: Payer: Self-pay | Admitting: Adult Health

## 2019-02-07 ENCOUNTER — Ambulatory Visit: Payer: Medicare Other | Admitting: Adult Health

## 2019-02-07 NOTE — Progress Notes (Deleted)
[facility] [address] [place]. [place] [address] [phone]       OFFICE FOLLOW UP NOTE  Mr. MCCLAIN OTTINGER Date of Birth:  [birth year] Medical Record Number:  [id number]   Referring MD:   Rudene Re Reason for visit: Seizures [facility] provider: [name]   No chief complaint on file.    HPI:   Initial visit 05/02/2018 PS: Mr Kraus is a pleasant 85 year old Caucasian male was seen today for initial office consultation visit.  He is accompanied by his wife and daughter.  History is obtained from them and review of referral notes.  I have reviewed imaging films in PACS.  He has been having multiple brief episodes of altered awareness for several years but these appear to have increased in the last 6 months and frequency as well as duration.  He had a prolonged episode recently in November 2019 when he went to the ER.  CT scan of the head was obtained which I reviewed showed no acute abnormality except changes of age-related small vessel disease.  He is recently had a few back-to-back episodes as well.  He is unable to identify specific trigger for his episodes.  He is usually staring during these episodes and does not respond to commands.  He does not lose consciousness fall or hurt himself.  His episodes were previously lasting less than a minute the dose of few recent ones have been more prolonged.  Following some of these episodes he tends to get agitated and disoriented.  The patient's daughter feels she is noticed that he does some automatic hand movements and some of these episodes.  He denies any headache or any aura prior to these episodes.  He has no remote history of head injury with loss of consciousness intracranial hemorrhage or strokes.  There is no family history of epilepsy or seizures.  There is no specific pattern for these episodes which may occur once every few months to several in a week.  He has not had an EEG or MRI scan done or a trial of seizure  medications. He had prior history of right anterior frontal lacunar infarct in 11/27/2014 he was felt to have asymptomatic 80 to 90% left ICA stenosis at that time.  Echocardiogram was normal.  He has remote history of [facility] spotted fever with encephalitis.  He was treated with a 2-week course of doxycycline.  He did not have any definite seizures at that time.  He had episode of brief staring with a blank look on his face in May 2016 and was admitted to [facility] where he had an EEG which was negative for seizures.  He was seen by Dr. Theador Hawthorne neurologist as an outpatient and had a negative work-up for his seizures as well.  He was referred to Dr. Juanda Crumble feels vascular surgeon for left carotid stenosis follow-up with ultrasound in the office showed stenosis to be much less and hence conservative follow-up was recommended.  He was seen by me in the office in March 2017 but has not followed up since then.  He was found to subsequently have a transient episode of paroxysmal A. fib and was started on Eliquis by cardiologist [name].  He also had a second EEG done in September 2016 which was also unremarkable except for mild generalized slowing. Update 08/07/2018 PS : He returns for follow-up after last visit 3 months ago.  He is accompanied by his wife.  He states he is doing  well.  Is tolerating Keppra XR 500 mg quite well without any side effects.  He has had no spells like seizures or speech difficulties since the last visit.  He has had no stroke or TIA symptoms either.  He underwent EEG on 06/12/2018 which was read by me and was normal.  MRI scan of the brain done on 06/14/2018 which I have personally reviewed the images shows tiny right posterior temporal white matter lacunar infarct which was clinically silent as patient had no symptoms at that time.  Carotid ultrasound previously done on 12/23/2017 had shown 40 to 59% left ICA stenosis no significant stenosis on the right.   Transthoracic echo done on 04/05/2018 and showed normal ejection fraction.  Lipid profile done on 07/05/2018 was unremarkable with LDL cholesterol of 66 mg percent, HDL of 59 and total cholesterol 152 mg percent.  Hemoglobin A1c on 06/28/2018 was 6.0.  Patient has been diagnosed with sleep apnea by his pulmonologist Dr Leonidas Romberg and has started using CPAP every night recently.  He has no new complaints today.  He states his tremors are unchanged and are not functionally disabling.  He does follow-up with [name] from vascular surgery for carotid stenosis and will have follow-up carotid ultrasound done in upcoming visit in the spring  Update 02/07/2019: Mr. Maslo is a 85 year old male who is being seen today for follow-up.  He has been doing well from a neurological standpoint.  He continues on Keppra XR 500 mg daily without recurrent seizure episodes or speech difficulties.  He did have a ED visit on 12/29/2018 for syncopal episode which was felt likely due to orthostatic hypotension with cardiology follow-up and medications adjusted.  Syncopal episode was not felt to be related to seizures.  Denies new or recurring stroke/TIA symptoms as well.  He remains on Eliquis without bleeding or bruising.  He endorses ongoing compliance with CPAP for OSA management.  Blood pressure today ***.  Denies new or worsening stroke/TIA symptoms.     ROS:   14 system review of systems is positive for hearing loss, apnea speech difficulty, denies and all other systems negative  PMH:  Past Medical History:  Diagnosis Date   Acute CVA (cerebrovascular accident) ([place])    Acute encephalopathy 11/27/2014   Acute on chronic combined systolic and diastolic CHF (congestive heart failure) (HCC)    Aortic atherosclerosis ([place]) 11/25/2014   Atrial fibrillation (HCC)    Diabetes mellitus    Heart murmur    mild aortic stenosis   Hypercholesteremia    Hypertension    Impaired fasting glucose    Macular degeneration     Multiple lacunar infarcts (HCC)    Obstructive sleep apnea 06/27/2018   Prostate cancer ([place]) radiation + seeding implant (Dr.[name])   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 Hilarie Fredrickson)   [facility] spotted fever) 10/2014   (hosp with FUO, encephalopathy)   Rotator cuff tear, right 10/2010   supraspinatous and infraspinatous   TIA (transient ischemic attack) 10/02/13   hosp at [facility] x 1 night   Type 2 diabetes mellitus with microalbuminuria or microproteinuria 01/15/2014    Social History:  Social History   Socioeconomic History   Marital status: Married    Spouse name: Not on file   Number of children: 4   Years of education: Not on file   Highest education level: Not on file  Occupational History   Occupation: Retired  Wellsite geologist  resource strain: Not on file   Food insecurity    Worry: Not on file    Inability: Not on file   Transportation needs    Medical: Not on file    Non-medical: Not on file  Tobacco Use   Smoking status: Former Smoker    Packs/day: 1.00    Years: 20.00    Pack years: 20.00    Types: Cigarettes    Quit date: 05/31/1980    Years since quitting: 38.7   Smokeless tobacco: Never Used  Substance and Sexual Activity   Alcohol use: Yes    Alcohol/week: 14.0 standard drinks    Types: 14 Cans of beer per week    Comment: 2 cans of beer most days of the week   Drug use: No   Sexual activity: Not Currently    Partners: Female    Comment: issues with ED  Lifestyle   Physical activity    Days per week: Not on file    Minutes per session: Not on file   Stress: Not on file  Relationships   Social connections    Talks on phone: Not on file    Gets together: Not on file    Attends religious service: Not on file    Active member of club or organization: Not on file    Attends meetings of clubs or organizations: Not on file    Relationship status: Not on file    Intimate partner violence    Fear of current or ex partner: Not on file    Emotionally abused: Not on file    Physically abused: Not on file    Forced sexual activity: Not on file  Other Topics Concern   Not on file  Social History Narrative   Married. Retired Engineer, structural.  2 sons, 2 daughters (all in [place]), 15 grandchildren, 7 great grandchild, 2 more expected    Medications:   Current Outpatient Medications on File Prior to Visit  Medication Sig Dispense Refill   acetaminophen (TYLENOL ARTHRITIS PAIN) 650 MG CR tablet Take 1 tablet (650 mg total) by mouth every 8 (eight) hours as needed.     betamethasone dipropionate (DIPROLENE) 0.05 % ointment Apply topically 2 (two) times daily.     carvedilol (COREG) 6.25 MG tablet Take 1 tablet (6.25 mg total) by mouth 2 (two) times daily with a meal. 180 tablet 3   ELIQUIS 5 MG TABS tablet TAKE ONE TABLET BY MOUTH TWICE DAILY 180 tablet 1   HYDROcodone-acetaminophen (NORCO) 5-325 MG tablet Take 1 tablet by mouth every 6 (six) hours as needed for moderate pain. 15 tablet 0   levETIRAcetam (KEPPRA XR) 500 MG 24 hr tablet TAKE ONE TABLET EVERY DAY 90 tablet 3   loratadine (CLARITIN) 10 MG tablet Take by mouth.     lovastatin (MEVACOR) 40 MG tablet TAKE TWO TABLETS AT BEDTIME 180 tablet 0   Multiple Vitamins-Minerals (PRESERVISION AREDS) CAPS Take 1 capsule by mouth 2 (two) times daily.      UNABLE TO FIND Bevacizumab(AVASTIN) chemo injection for the eye every 6 weeks     No current facility-administered medications on file prior to visit.     Allergies:  No Known Allergies  Physical Exam General: Frail elderly Caucasian male seated, in no evident distress Head: head normocephalic and atraumatic.   Neck: supple with soft left  carotid   bruit Cardiovascular: regular rate and rhythm, no murmurs Musculoskeletal: no deformity Skin:  no rash/petichiae Vascular:  Normal pulses  all extremities  Neurologic Exam Mental Status: Awake  and fully alert. Oriented to place and time. Recent and remote memory intact. Attention span, concentration and fund of knowledge appropriate. Mood and affect appropriate.  Positive glabellar tap.  Diminished facial expression. Cranial Nerves: Fundoscopic exam reveals sharp disc margins. Pupils equal, briskly reactive to light. Extraocular movements full without nystagmus. Visual fields full to confrontation. Hearing diminished bilaterally. Facial sensation intact. Face, tongue, palate moves normally and symmetrically.  Motor: Normal bulk and tone. Normal strength in all tested extremity muscles.  Mild action tremor of outstretched upper extremities left greater than right.  Intermittent head tremor.  Cogwheel rigidity left greater than right wrist upon activation mainly.  No bradykinesia.  Able to arise from chair with arms folded across her chest.  Mild retropulsion present. Sensory.: intact to touch , pinprick , position and vibratory sensation.  Coordination: Rapid alternating movements normal in all extremities. Finger-to-nose and heel-to-shin performed accurately bilaterally. Gait and Station: Arises from chair without difficulty. Stance is normal. Gait demonstrates normal stride length and balance but slight diminished left arm swing.  No festination no stooped posture.. Able to heel, toe and tandem walk without difficulty.  Reflexes: 1+ and symmetric. Toes downgoing.      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  He has previously had EEG done twice which has been negative for epileptiform activity.  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   -Maintain aggressive risk factor modification with strict control of hypertension with blood pressure goal below 130/90, hyper lipidemia with LDL goal below 70 mg percent and diabetes with hemoglobin A1c goal below 6.5%.  He was encouraged to eat a healthy diet with lots of fruits, vegetables, cereals and whole grains and to be active and exercise regularly.   -Continue Keppra XR 500 mg daily for seizure prophylaxis health it seems to be working and he has no side effects.   -Continue follow-up with Dr.Fields vascular surgery for his moderate left carotid stenosis.      Greater than 50% time during this 25-minute  visit was spent on counseling and coordination of care and discussion about complex partial seizures and parkinsonian tremor and answering questions he will return for follow-up in 3 months or call earlier if necessary.  Frann Rider, AGNP-BC  [facility] [address] [place], [place] [address]  Phone [phone] Fax [phone] Note: This document was prepared with digital dictation and possible smart phrase technology. Any transcriptional errors that result from this process are unintentional.

## 2019-02-20 ENCOUNTER — Other Ambulatory Visit: Payer: Self-pay

## 2019-02-20 ENCOUNTER — Encounter: Payer: Self-pay | Admitting: Adult Health

## 2019-02-20 ENCOUNTER — Ambulatory Visit (INDEPENDENT_AMBULATORY_CARE_PROVIDER_SITE_OTHER): Payer: Medicare Other | Admitting: Adult Health

## 2019-02-20 VITALS — BP 189/90 | HR 74 | Temp 97.7°F | Ht 71.0 in | Wt 169.4 lb

## 2019-02-20 DIAGNOSIS — R251 Tremor, unspecified: Secondary | ICD-10-CM

## 2019-02-20 DIAGNOSIS — G40909 Epilepsy, unspecified, not intractable, without status epilepticus: Secondary | ICD-10-CM

## 2019-02-20 DIAGNOSIS — Z8673 Personal history of transient ischemic attack (TIA), and cerebral infarction without residual deficits: Secondary | ICD-10-CM | POA: Diagnosis not present

## 2019-02-20 NOTE — Patient Instructions (Signed)
Your Plan:  Continue Keppra 500 mg XR for seizure prevention  Continue to monitor your tremor symptoms and let us know if they worsen or start interfering with daily activity  Follow-up in 6 months or call earlier if needed     Thank you for coming to see [place] at [facility]. I hope we have been able to provide you high quality care today.  You may receive a patient satisfaction survey over the next few weeks. We would appreciate your feedback and comments so that we may continue to improve ourselves and the health of our patients.

## 2019-02-20 NOTE — Progress Notes (Signed)
[facility] [address] [place]. [place] [address] [phone]       OFFICE FOLLOW UP NOTE  Jeffrey. Jeffrey [name] Date of Birth:  01/04/1933 Medical Record Number:  [id number]   Referring MD:   Rudene Re Reason for visit: Seizures [facility] provider: [name]   Chief Complaint  Patient presents with   Follow-up    Treatment room, with with wife. Lacunar infarction. "No concerns other than back pain."     HPI:   Initial visit 05/02/2018 PS: Jeffrey [name] is a pleasant 85 year old Caucasian male was seen today for initial office consultation visit.  He is accompanied by his wife and daughter.  History is obtained from them and review of referral notes.  I have reviewed imaging films in PACS.  He has been having multiple brief episodes of altered awareness for several years but these appear to have increased in the last 6 months and frequency as well as duration.  He had a prolonged episode recently in November 2019 when he went to the ER.  CT scan of the head was obtained which I reviewed showed no acute abnormality except changes of age-related small vessel disease.  He is recently had a few back-to-back episodes as well.  He is unable to identify specific trigger for his episodes.  He is usually staring during these episodes and does not respond to commands.  He does not lose consciousness fall or hurt himself.  His episodes were previously lasting less than a minute the dose of few recent ones have been more prolonged.  Following some of these episodes he tends to get agitated and disoriented.  The patient's daughter feels she is noticed that he does some automatic hand movements and some of these episodes.  He denies any headache or any aura prior to these episodes.  He has no remote history of head injury with loss of consciousness intracranial hemorrhage or strokes.  There is no family history of epilepsy or seizures.  There is no specific pattern for these episodes which  may occur once every few months to several in a week.  He has not had an EEG or MRI scan done or a trial of seizure medications. He had prior history of right anterior frontal lacunar infarct in 11/27/2014 he was felt to have asymptomatic 80 to 90% left ICA stenosis at that time.  Echocardiogram was normal.  He has remote history of [facility] spotted fever with encephalitis.  He was treated with a 2-week course of doxycycline.  He did not have any definite seizures at that time.  He had episode of brief staring with a blank look on his face in May 2016 and was admitted to [facility] where he had an EEG which was negative for seizures.  He was seen by Dr. Theador Hawthorne neurologist as an outpatient and had a negative work-up for his seizures as well.  He was referred to Dr. Juanda Crumble feels vascular surgeon for left carotid stenosis follow-up with ultrasound in the office showed stenosis to be much less and hence conservative follow-up was recommended.  He was seen by me in the office in March 2017 but has not followed up since then.  He was found to subsequently have a transient episode of paroxysmal A. fib and was started on Eliquis by cardiologist [name].  He also had a second EEG done in September 2016 which was also unremarkable except for mild generalized slowing. Update 08/07/2018 PS : He returns  for follow-up after last visit 3 months ago.  He is accompanied by his wife.  He states he is doing well.  Is tolerating Keppra XR 500 mg quite well without any side effects.  He has had no spells like seizures or speech difficulties since the last visit.  He has had no stroke or TIA symptoms either.  He underwent EEG on 06/12/2018 which was read by me and was normal.  MRI scan of the brain done on 06/14/2018 which I have personally reviewed the images shows tiny right posterior temporal white matter lacunar infarct which was clinically silent as patient had no symptoms at that time.  Carotid  ultrasound previously done on 12/23/2017 had shown 40 to 59% left ICA stenosis no significant stenosis on the right.  Transthoracic echo done on 04/05/2018 and showed normal ejection fraction.  Lipid profile done on 07/05/2018 was unremarkable with LDL cholesterol of 66 mg percent, HDL of 59 and total cholesterol 152 mg percent.  Hemoglobin A1c on 06/28/2018 was 6.0.  Patient has been diagnosed with sleep apnea by his pulmonologist Dr Leonidas Romberg and has started using CPAP every night recently.  He has no new complaints today.  He states his tremors are unchanged and are not functionally disabling.  He does follow-up with [name] from vascular surgery for carotid stenosis and will have follow-up carotid ultrasound done in upcoming visit in the spring  Update 02/20/2019: Jeffrey [name] is a 85 year old male who is being seen today for follow-up accompanied by wife.  He has been doing well from a neurological standpoint.  He continues on Keppra XR 500 mg daily without recurrent seizure episodes or speech difficulties.  He did have a ED visit on 12/29/2018 for syncopal episode which was felt likely due to orthostatic hypotension with cardiology follow-up and medications adjusted.  Syncopal episode was not felt to be related to seizures.  Denies new or recurring stroke/TIA symptoms as well.  Upper extremity L>R tremors have been stable and do not interfere with daily functioning.  He remains on Eliquis without bleeding or bruising.  He endorses ongoing compliance with CPAP for OSA management.  Blood pressure today elevated at 189/90 but does endorse elevation at doctor's appointments.  He does monitor at home and typically 120s/70s.  Denies new or worsening stroke/TIA symptoms.     ROS:   14 system review of systems is positive for tremors denies and all other systems negative  PMH:  Past Medical History:  Diagnosis Date   Acute CVA (cerebrovascular accident) ([place])    Acute encephalopathy 11/27/2014   Acute on  chronic combined systolic and diastolic CHF (congestive heart failure) ([facility])    Aortic atherosclerosis ([place]) 11/25/2014   Atrial fibrillation ([facility])    Diabetes mellitus    Heart murmur    mild aortic stenosis   Hypercholesteremia    Hypertension    Impaired fasting glucose    Macular degeneration    Multiple lacunar infarcts ([place])    Obstructive sleep apnea 06/27/2018   Prostate cancer ([place]) radiation + seeding implant (Dr.[name])   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 Hilarie Fredrickson)   [facility] spotted fever) 10/2014   (hosp with FUO, encephalopathy)   Rotator cuff tear, right 10/2010   supraspinatous and infraspinatous   TIA (transient ischemic attack) 10/02/13   hosp at [facility] x 1 night   Type 2 diabetes mellitus with microalbuminuria or microproteinuria 01/15/2014    Social History:  Social  History   Socioeconomic History   Marital status: Married    Spouse name: Not on file   Number of children: 4   Years of education: Not on file   Highest education level: Not on file  Occupational History   Occupation: Retired  Scientist, product/process development strain: Not on file   Food insecurity    Worry: Not on file    Inability: Not on Lexicographer needs    Medical: Not on file    Non-medical: Not on file  Tobacco Use   Smoking status: Former Smoker    Packs/day: 1.00    Years: 20.00    Pack years: 20.00    Types: Cigarettes    Quit date: 05/31/1980    Years since quitting: 38.7   Smokeless tobacco: Never Used  Substance and Sexual Activity   Alcohol use: Yes    Alcohol/week: 14.0 standard drinks    Types: 14 Cans of beer per week    Comment: 2 cans of beer most days of the week   Drug use: No   Sexual activity: Not Currently    Partners: Female    Comment: issues with ED  Lifestyle   Physical activity    Days per week: Not on file    Minutes per session: Not on file   Stress:  Not on file  Relationships   Social connections    Talks on phone: Not on file    Gets together: Not on file    Attends religious service: Not on file    Active member of club or organization: Not on file    Attends meetings of clubs or organizations: Not on file    Relationship status: Not on file   Intimate partner violence    Fear of current or ex partner: Not on file    Emotionally abused: Not on file    Physically abused: Not on file    Forced sexual activity: Not on file  Other Topics Concern   Not on file  Social History Narrative   Married. Retired Engineer, structural.  2 sons, 2 daughters (all in [place]), 15 grandchildren, 7 great grandchild, 2 more expected    Medications:   Current Outpatient Medications on File Prior to Visit  Medication Sig Dispense Refill   acetaminophen (TYLENOL ARTHRITIS PAIN) 650 MG CR tablet Take 1 tablet (650 mg total) by mouth every 8 (eight) hours as needed.     betamethasone dipropionate (DIPROLENE) 0.05 % ointment Apply topically 2 (two) times daily.     carvedilol (COREG) 6.25 MG tablet Take 1 tablet (6.25 mg total) by mouth 2 (two) times daily with a meal. 180 tablet 3   ELIQUIS 5 MG TABS tablet TAKE ONE TABLET BY MOUTH TWICE DAILY 180 tablet 1   HYDROcodone-acetaminophen (NORCO) 5-325 MG tablet Take 1 tablet by mouth every 6 (six) hours as needed for moderate pain. 15 tablet 0   levETIRAcetam (KEPPRA XR) 500 MG 24 hr tablet TAKE ONE TABLET EVERY DAY 90 tablet 3   loratadine (CLARITIN) 10 MG tablet Take by mouth.     lovastatin (MEVACOR) 40 MG tablet TAKE TWO TABLETS AT BEDTIME 180 tablet 0   Multiple Vitamins-Minerals (PRESERVISION AREDS) CAPS Take 1 capsule by mouth 2 (two) times daily.      UNABLE TO FIND Bevacizumab(AVASTIN) chemo injection for the eye every 6 weeks     No current facility-administered medications on file prior to visit.  Allergies:  No Known Allergies  Physical Exam  Today's Vitals   02/20/19 1535  BP:  (!) 189/90  Pulse: 74  Temp: 97.7 F (36.5 C)  Weight: 169 lb 6.4 oz (76.8 kg)  Height: 5\' 11"  (1.803 m)   Body mass index is 23.63 kg/m.   General: Pleasant frail elderly Caucasian male seated, in no evident distress Head: head normocephalic and atraumatic.   Neck: supple with soft left  carotid   bruit Cardiovascular: regular rate and rhythm, no murmurs Musculoskeletal: no deformity Skin:  no rash/petichiae Vascular:  Normal pulses all extremities  Neurologic Exam Mental Status: Awake and fully alert. Oriented to place and time. Recent and remote memory intact. Attention span, concentration and fund of knowledge appropriate. Mood and affect appropriate.   Cranial Nerves: Pupils equal, briskly reactive to light. Extraocular movements full without nystagmus. Visual fields full to confrontation. Hearing diminished bilaterally. Facial sensation intact. Face, tongue, palate moves normally and symmetrically.  Motor: Normal bulk and tone. Normal strength in all tested extremity muscles. Mild to moderate action tremor of outstretched upper extremities left greater than right.  Intermittent head tremor.  Mild Cogwheel rigidity left greater than right wrist upon activation mainly.  No bradykinesia.   Sensory.: intact to touch , pinprick , position and vibratory sensation.  Coordination: Rapid alternating movements normal in all extremities. Finger-to-nose and heel-to-shin performed accurately bilaterally. Gait and Station: Arises from chair without difficulty. Stance is normal. Gait demonstrates normal stride length and balance but slight diminished left arm swing. No festination no stooped posture.. Able to heel, toe and tandem walk without difficulty.  Reflexes: 1+ and symmetric. Toes downgoing.      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  He has previously had EEG done twice which has been negative for epileptiform activity.  He also has mild left greater than right upper extremity and head tremor possibly early parkinsonian tremor vs essential tremor which does not appear to be functionally disabling     PLAN: -Continue Keppra 500 mg XR for seizure prophylaxis -Advised to continue to monitor tremors at this time as they are not disabling or interfere with daily activity -Continue Eliquis and Mevacor for stroke prevention  -Continue to follow with PCP for HTN and HLD management -Continue to follow with [name] vascular surgery as recommended -Maintain aggressive risk factor modification with strict control of hypertension with blood pressure goal below 130/90, hyper lipidemia with LDL goal below 70 mg percent and diabetes with hemoglobin A1c goal below 6.5%.  He was encouraged to eat a healthy diet with lots of fruits, vegetables, cereals and whole grains and to be active and exercise regularly.    Follow-up in 6 months or call earlier if needed  Greater than 50% time during this 25-minute  visit was spent on counseling and coordination of care and discussion about complex partial seizures and parkinsonian vs essential tremor and answering questions   Frann Rider, [facility]  [facility] [address] [place], [place] [address]  Phone [phone] Fax [phone] Note: This document was prepared with digital dictation and possible smart phrase technology. Any transcriptional errors that result from this process are unintentional.

## 2019-02-21 NOTE — Progress Notes (Signed)
I agree with the above plan 

## 2019-03-08 ENCOUNTER — Ambulatory Visit: Payer: Medicare Other | Admitting: Cardiovascular Disease

## 2019-03-09 ENCOUNTER — Other Ambulatory Visit: Payer: Self-pay

## 2019-03-09 ENCOUNTER — Telehealth (INDEPENDENT_AMBULATORY_CARE_PROVIDER_SITE_OTHER): Payer: Medicare Other | Admitting: Cardiovascular Disease

## 2019-03-09 ENCOUNTER — Encounter: Payer: Self-pay | Admitting: Cardiovascular Disease

## 2019-03-09 VITALS — BP 126/62 | HR 71 | Ht 71.0 in | Wt 170.0 lb

## 2019-03-09 DIAGNOSIS — I959 Hypotension, unspecified: Secondary | ICD-10-CM

## 2019-03-09 DIAGNOSIS — I10 Essential (primary) hypertension: Secondary | ICD-10-CM

## 2019-03-09 DIAGNOSIS — I480 Paroxysmal atrial fibrillation: Secondary | ICD-10-CM

## 2019-03-09 DIAGNOSIS — I359 Nonrheumatic aortic valve disorder, unspecified: Secondary | ICD-10-CM

## 2019-03-09 NOTE — Patient Instructions (Signed)
Medication Instructions:  Continue same medications If you need a refill on your cardiac medications before your next appointment, please call your pharmacy.   Lab work: None If you have labs (blood work) drawn today and your tests are completely normal, you will receive your results only by: . MyChart Message (if you have MyChart) OR . A paper copy in the mail If you have any lab test that is abnormal or we need to change your treatment, we will call you to review the results.  Testing/Procedures: None  Follow-Up: At [facility], you and your health needs are our priority.  As part of our continuing mission to provide you with exceptional heart care, we have created designated Provider Care Teams.  These Care Teams include your primary Cardiologist (physician) and Advanced Practice Providers (APPs -  Physician Assistants and Nurse Practitioners) who all work together to provide you with the care you need, when you need it. You will need a follow up appointment in 4 months.  Please call our office 2 months in advance to schedule this appointment.  You may see Muhammad Arida, MD or one of the following Advanced Practice Providers on your designated Care Team:   Christopher Berge, NP Ryan Dunn, PA-C . Jacquelyn Visser, PA-C  

## 2019-03-09 NOTE — Progress Notes (Signed)
Virtual Visit via Telephone Note   This visit type was conducted due to national recommendations for restrictions regarding the COVID-19 Pandemic (e.g. social distancing) in an effort to limit this patient's exposure and mitigate transmission in our community.  Due to his co-morbid illnesses, this patient is at least at moderate risk for complications without adequate follow up.  This format is felt to be most appropriate for this patient at this time.  The patient did not have access to video technology/had technical difficulties with video requiring transitioning to audio format only (telephone).  All issues noted in this document were discussed and addressed.  No physical exam could be performed with this format.  Please refer to the patient's chart for his  consent to telehealth for [facility].   Date:  03/09/2019   ID:  Jeffrey [name], DOB May 25, 1933, MRN [id number]  Patient Location: Home Provider Location: Office  PCP:  Rita Ohara, MD  Cardiologist:  Kathlyn Sacramento, MD  Electrophysiologist:  None   Evaluation Performed:  Follow-Up Visit  Chief Complaint: Frequent urination at night  History of Present Illness:    Jeffrey [name] is a 85 y.o. male who was reached via phone for a follow-up visit regarding paroxysmal atrial fibrillation and essential hypertension. Other medical problems include hyperlipidemia, TIA,  aortic stenosis, moderate left carotid stenosis and borderline diabetes mellitus. He had previous stroke in June 2016 and later was found to have [facility] Fever which was treated with doxycycline.   Nuclear stress test in August of 2016  showed no evidence of ischemia with normal ejection fraction. Most recent echocardiogram in November 2019 showed normal LV systolic function, moderate aortic stenosis with a mean gradient of 21 mmHg and valve area of 0.95.  Pulmonary pressure was moderately elevated at 56 mmHg.  The patient had issues with severely  elevated blood pressure last year which required switching metoprolol to carvedilol and adding amlodipine.  He has a component of whitecoat syndrome. The patient was referred to pulmonary due to presence of pulmonary hypertension on echo.  He was found to have sleep apnea and currently using CPAP.  He had an emergency room visit in July with a possible syncopal episode with low blood pressure.  He had facial laceration and was instructed to hold Eliquis for 2 days.  His blood pressure usually drops in the afternoon after he takes his medications. He was seen by Christell Faith in August and his blood pressure at that time was 180/92.  He was very orthostatic.  The patient was instructed to increase fluid intake.  The p.m. dose of carvedilol was decreased to 3.125 mg. He underwent an echocardiogram which showed normal LV systolic function, moderate pulmonary hypertension and moderate aortic stenosis. 14-day ZIO patch monitor showed sinus rhythm with an average heart rate of 71 bpm.  There were 2 runs of ventricular tachycardia the longest lasted 17 beats.  In addition, 27 runs of SVT were noted the longest lasted 14 beats.  Given frequent ectopy, it was decided to discontinue lisinopril and amlodipine and uptitrate carvedilol back to 6.25 mg twice daily.  Since that change, the patient actually reports improvement in symptoms.  His blood pressure remains mostly controlled at home.  It is clear that the patient has prolonged history of whitecoat syndrome.  He denies chest pain or shortness of breath.  The patient does not have symptoms concerning for COVID-19 infection (fever, chills, cough, or new shortness of breath).    Past  Medical History:  Diagnosis Date  . Acute CVA (cerebrovascular accident) ([place])   . Acute encephalopathy 11/27/2014  . Acute on chronic combined systolic and diastolic CHF (congestive heart failure) ([place])   . Aortic atherosclerosis ([place]) 11/25/2014  . Atrial fibrillation ([place])   .  Diabetes mellitus   . Heart murmur    mild aortic stenosis  . Hypercholesteremia   . Hypertension   . Impaired fasting glucose   . Macular degeneration   . Multiple lacunar infarcts ([place])   . Obstructive sleep apnea 06/27/2018  . Prostate cancer ([place]) radiation + seeding implant (Dr.[name])  . 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 Hilarie Fredrickson)  . RMSF [facility] spotted fever) 10/2014   (hosp with FUO, encephalopathy)  . Rotator cuff tear, right 10/2010   supraspinatous and infraspinatous  . TIA (transient ischemic attack) 10/02/13   hosp at [facility] x 1 night  . Type 2 diabetes mellitus with microalbuminuria or microproteinuria 01/15/2014   Past Surgical History:  Procedure Laterality Date  . CATARACT EXTRACTION Right 01/2017  . CIRCUMCISION  01/2017   [name]  . COLONOSCOPY N/A 11/28/2012   Procedure: COLONOSCOPY;  Surgeon: Jerene Bears, MD;  Location: [facility];  Service: Gastroenterology;  Laterality: N/A;  . INTERNAL URETHROTOMY  01/27/2016   WF  . prostate seed implant    . ROTATOR CUFF REPAIR  06/16/2011   right (Dr. Berenice Primas)  . TONSILLECTOMY AND ADENOIDECTOMY  age 50     Current Meds  Medication Sig  . acetaminophen (TYLENOL ARTHRITIS PAIN) 650 MG CR tablet Take 1 tablet (650 mg total) by mouth every 8 (eight) hours as needed.  . betamethasone dipropionate (DIPROLENE) 0.05 % ointment Apply topically 2 (two) times daily.  . carvedilol (COREG) 6.25 MG tablet Take 1 tablet (6.25 mg total) by mouth 2 (two) times daily with a meal.  . ELIQUIS 5 MG TABS tablet TAKE ONE TABLET BY MOUTH TWICE DAILY  . HYDROcodone-acetaminophen (NORCO) 5-325 MG tablet Take 1 tablet by mouth every 6 (six) hours as needed for moderate pain.  [name] levETIRAcetam (KEPPRA XR) 500 MG 24 hr tablet TAKE ONE TABLET EVERY DAY  . loratadine (CLARITIN) 10 MG tablet Take by mouth.  . lovastatin (MEVACOR) 40 MG tablet TAKE TWO TABLETS AT BEDTIME  . Multiple  Vitamins-Minerals (PRESERVISION AREDS) CAPS Take 1 capsule by mouth 2 (two) times daily.   [name] UNABLE TO FIND Bevacizumab(AVASTIN) chemo injection for the eye every 6 weeks     Allergies:   Patient has no known allergies.   Social History   Tobacco Use  . Smoking status: Former Smoker    Packs/day: 1.00    Years: 20.00    Pack years: 20.00    Types: Cigarettes    Quit date: 05/31/1980    Years since quitting: 38.7  . Smokeless tobacco: Never Used  Substance Use Topics  . Alcohol use: Yes    Alcohol/week: 14.0 standard drinks    Types: 14 Cans of beer per week    Comment: 2 cans of beer most days of the week  . Drug use: No     Family Hx: The patient's family history includes Cancer in his daughter; Congestive Heart Failure in his grandchild; Dementia in his mother; Diabetes in his daughter and mother; Heart disease in his mother and son; Heart disease (age of onset: 44) in his father; Hypertension in his mother; Stroke in his sister.  ROS:   Please see  the history of present illness.     All other systems reviewed and are negative.   Prior CV studies:   The following studies were reviewed today:  Reviewed most recent echocardiogram in November 2019  Labs/Other Tests and Data Reviewed:    EKG:  No ECG reviewed.  Recent Labs: 07/05/2018: TSH 1.490 12/29/2018: ALT 21; BUN 29; Creatinine, Ser 1.14; Hemoglobin 11.8; Platelets 284; Potassium 4.4; Sodium 138   Recent Lipid Panel Lab Results  Component Value Date/Time   CHOL 152 07/05/2018 09:10 AM   CHOL 184 10/01/2013 05:19 PM   TRIG 133 07/05/2018 09:10 AM   TRIG 400 (H) 10/01/2013 05:19 PM   HDL 59 07/05/2018 09:10 AM   HDL 55 10/01/2013 05:19 PM   CHOLHDL 2.6 07/05/2018 09:10 AM   CHOLHDL 2.3 05/27/2017 10:07 AM   LDLCALC 66 07/05/2018 09:10 AM   LDLCALC 78 05/27/2017 10:07 AM   LDLCALC 49 10/01/2013 05:19 PM    Wt Readings from Last 3 Encounters:  03/09/19 170 lb (77.1 kg)  02/20/19 169 lb 6.4 oz (76.8 kg)   01/02/19 169 lb 8 oz (76.9 kg)     Objective:    Vital Signs:  BP 126/62   Pulse 71   Ht 5\' 11"  (1.803 m)   Wt 170 lb (77.1 kg)   BMI 23.71 kg/m    VITAL SIGNS:  reviewed  ASSESSMENT & PLAN:    1.  Severe orthostatic hypotension: It is clear that the patient has what seems to be a classic case of whitecoat syndrome.  We added more blood pressure medications last year based on office readings but his blood pressure at home is actually much lower.  In that setting and with underlying moderate aortic stenosis, I can see why his orthostatic hypotension worsened significantly with a syncopal episode.  I think we have to treat his essential hypertension based on home blood pressure readings and not office readings and lean towards having a higher blood pressure than the normal.  It appears that he is stable on current dose of carvedilol and I elected to continue this.  2.  Paroxysmal atrial fibrillation: On long-term anticoagulation with Eliquis.  No recurrent symptoms.  He is doing well overall.    3.  Essential hypertension: Blood pressure seems well controlled on carvedilol alone without lisinopril and amlodipine.  4.  Moderate aortic stenosis.  This was stable on most recent echocardiogram in August.  5.  Obstructive sleep apnea currently on CPAP but he has hard time with it due to waking up to urinate.     COVID-19 Education: The signs and symptoms of COVID-19 were discussed with the patient and how to seek care for testing (follow up with PCP or arrange E-visit).   The importance of social distancing was discussed today.  Time:   Today, I have spent 5 minutes with the patient with telehealth technology discussing the above problems.     Medication Adjustments/Labs and Tests Ordered: Current medicines are reviewed at length with the patient today.  Concerns regarding medicines are outlined above.   Tests Ordered: No orders of the defined types were placed in this  encounter.   Medication Changes: No orders of the defined types were placed in this encounter.   Disposition:  Follow up in 4 month(s)   Signed, Kathlyn Sacramento, MD  03/09/2019 10:47 AM    [place]

## 2019-03-14 DIAGNOSIS — Z23 Encounter for immunization: Secondary | ICD-10-CM | POA: Diagnosis not present

## 2019-03-27 ENCOUNTER — Other Ambulatory Visit: Payer: Self-pay

## 2019-03-27 ENCOUNTER — Other Ambulatory Visit: Payer: Self-pay | Admitting: Family Medicine

## 2019-03-27 DIAGNOSIS — E7800 Pure hypercholesterolemia, unspecified: Secondary | ICD-10-CM

## 2019-05-10 DIAGNOSIS — H353211 Exudative age-related macular degeneration, right eye, with active choroidal neovascularization: Secondary | ICD-10-CM | POA: Diagnosis not present

## 2019-05-10 DIAGNOSIS — H353124 Nonexudative age-related macular degeneration, left eye, advanced atrophic with subfoveal involvement: Secondary | ICD-10-CM | POA: Diagnosis not present

## 2019-05-14 ENCOUNTER — Other Ambulatory Visit: Payer: Self-pay | Admitting: Cardiovascular Disease

## 2019-05-14 NOTE — Telephone Encounter (Signed)
Refill Request.  

## 2019-05-14 NOTE — Telephone Encounter (Signed)
Pt's age 85, wt 77.1 kg, SCr 1.14, CrCl 52.6, last ov w/ MA 03/09/19.

## 2019-07-06 ENCOUNTER — Other Ambulatory Visit: Payer: Self-pay | Admitting: Family Medicine

## 2019-07-06 DIAGNOSIS — E7800 Pure hypercholesterolemia, unspecified: Secondary | ICD-10-CM

## 2019-07-12 DIAGNOSIS — H353211 Exudative age-related macular degeneration, right eye, with active choroidal neovascularization: Secondary | ICD-10-CM | POA: Diagnosis not present

## 2019-07-12 DIAGNOSIS — H353124 Nonexudative age-related macular degeneration, left eye, advanced atrophic with subfoveal involvement: Secondary | ICD-10-CM | POA: Diagnosis not present

## 2019-07-12 DIAGNOSIS — Z961 Presence of intraocular lens: Secondary | ICD-10-CM | POA: Diagnosis not present

## 2019-07-13 ENCOUNTER — Ambulatory Visit: Payer: Medicare Other | Admitting: Cardiovascular Disease

## 2019-07-18 ENCOUNTER — Ambulatory Visit (INDEPENDENT_AMBULATORY_CARE_PROVIDER_SITE_OTHER): Payer: Medicare Other | Admitting: Nurse Practitioner

## 2019-07-18 ENCOUNTER — Other Ambulatory Visit: Payer: Self-pay

## 2019-07-18 ENCOUNTER — Encounter: Payer: Self-pay | Admitting: Nurse Practitioner

## 2019-07-18 VITALS — BP 220/100 | HR 75 | Temp 97.0°F | Ht 71.0 in | Wt 172.5 lb

## 2019-07-18 DIAGNOSIS — I350 Nonrheumatic aortic (valve) stenosis: Secondary | ICD-10-CM | POA: Diagnosis not present

## 2019-07-18 DIAGNOSIS — I480 Paroxysmal atrial fibrillation: Secondary | ICD-10-CM

## 2019-07-18 DIAGNOSIS — I951 Orthostatic hypotension: Secondary | ICD-10-CM | POA: Diagnosis not present

## 2019-07-18 DIAGNOSIS — I10 Essential (primary) hypertension: Secondary | ICD-10-CM

## 2019-07-18 NOTE — Progress Notes (Signed)
Office Visit    Patient Name: Jeffrey [name] Date of Encounter: 07/18/2019  Primary Care Provider:  Rita Ohara, MD Primary Cardiologist:  Kathlyn Sacramento, MD  Chief Complaint    86 year old male with a history of paroxysmal atrial fibrillation, hypertension, hyperlipidemia, TIA/stroke, moderate aortic stenosis, moderate left carotid stenosis, borderline diabetes, and [facility] spotted fever, who presents for follow-up of aortic stenosis.  Past Medical History    Past Medical History:  Diagnosis Date  . Acute CVA (cerebrovascular accident) ([place])   . Acute encephalopathy 11/27/2014  . Aortic atherosclerosis ([place]) 11/25/2014  . Atrial fibrillation ([place])   . Chronic combined systolic (congestive) and diastolic (congestive) heart failure ([place])    a. 10/2014 Echo: EF 40-45%; b. 03/2018 Echo: EF 55-60%; c. 12/2018 Echo: EF 55-60%, mild conc LVH. Nl RV fxn. RVSP 51.4mmHg. Mildly dil LA. Mod AS w/ severe Ca2+.  . Diabetes mellitus   . Heart murmur    mild aortic stenosis  . Hypercholesteremia   . Hypertension   . Impaired fasting glucose   . Macular degeneration   . Moderate aortic stenosis    a. 12/2018 Echo: EF 55-60%, Mod AS w/ sev AoV Ca2+.  . Multiple lacunar infarcts ([place])   . Obstructive sleep apnea 06/27/2018  . Prostate cancer ([place]) radiation + seeding implant (Dr.[name])  . 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 Hilarie Fredrickson)  . RMSF [facility] spotted fever) 10/2014   (hosp with FUO, encephalopathy)  . Rotator cuff tear, right 10/2010   supraspinatous and infraspinatous  . TIA (transient ischemic attack) 10/02/13   hosp at [facility] x 1 night  . Type 2 diabetes mellitus with microalbuminuria or microproteinuria 01/15/2014   Past Surgical History:  Procedure Laterality Date  . CATARACT EXTRACTION Right 01/2017  . CIRCUMCISION  01/2017   [name]  . COLONOSCOPY N/A 11/28/2012   Procedure: COLONOSCOPY;  Surgeon: Jerene Bears, MD;   [facility];  Service: Gastroenterology;  Laterality: N/A;  . INTERNAL URETHROTOMY  01/27/2016   WF  . prostate seed implant    . ROTATOR CUFF REPAIR  06/16/2011   right (Dr. Berenice Primas)  . TONSILLECTOMY AND ADENOIDECTOMY  age 5    Allergies  No Known Allergies  History of Present Illness    86 year old male with the above complex past medical history including paroxysmal atrial fibrillation, hypertension, hyperlipidemia, TIA/stroke (10/2014), moderate aortic stenosis, moderate left carotid stenosis, borderline diabetes, obstructive sleep apnea on CPAP, and [place] spotted fever.  He had a nonischemic stress test in August 2016.  He was seen in the emergency room in July 2020 with possible syncopal episode in the setting of low blood pressures.  When seen in August 2020, he was hypertensive at 180/92 but also very orthostatic.  P.m. carvedilol dose was reduced to 3.125 mg at that time.  An echocardiogram showed normal LV function with moderate pulmonary hypertension and moderate aortic stenosis.  Zio patch showed sinus rhythm with an average heart rate of 71 bpm.  There were 2 runs of ventricular tachycardia, the longest 17 beats.  In addition, 27 runs of SVT were noted-the longest 14 beats.  Given frequent ectopy, and orthostasis, lisinopril and amlodipine were discontinued in favor of titration of carvedilol back to 6.25 mg twice daily.  He was last seen via telemedicine visit in October 2020, at which time he reported improved blood pressures and symptoms.  Since his last visit, he has continued to  do well.  Blood pressures at home continue to trend in the 120s over 70s to 80s.  He has not been experiencing any significant orthostatic symptoms.  He notes being more sedentary in the setting of COVID-19 but has not been experiencing any symptoms of chest pain or dyspnea.  Further, he denies palpitations, PND, orthopnea, dizziness, syncope, edema, or early satiety.  He and his wife have  recently completed both doses of the [place] COVID-19 vaccination.  Home Medications    Prior to Admission medications   Medication Sig Start Date End Date Taking? Authorizing Provider  acetaminophen (TYLENOL ARTHRITIS PAIN) 650 MG CR tablet Take 1 tablet (650 mg total) by mouth every 8 (eight) hours as needed. 12/10/14   Love, Ivan Anchors, PA-C  betamethasone dipropionate (DIPROLENE) 0.05 % ointment Apply topically 2 (two) times daily.    [provider]  carvedilol (COREG) 6.25 MG tablet Take 1 tablet (6.25 mg total) by mouth 2 (two) times daily with a meal. 01/31/19   Dunn, Ryan M, PA-C  ELIQUIS 5 MG TABS tablet TAKE ONE TABLET BY MOUTH TWICE DAILY 05/14/19   Wellington Hampshire, MD  HYDROcodone-acetaminophen (NORCO) 5-325 MG tablet Take 1 tablet by mouth every 6 (six) hours as needed for moderate pain. 12/29/18   Paulette Blanch, MD  levETIRAcetam (KEPPRA XR) 500 MG 24 hr tablet TAKE ONE TABLET EVERY DAY 08/16/18   Garvin Fila, MD  loratadine (CLARITIN) 10 MG tablet Take by mouth.    [provider]  lovastatin (MEVACOR) 40 MG tablet TAKE TWO TABLETS EACH NIGHT AT BEDTIME 07/06/19   Rita Ohara, MD  Multiple Vitamins-Minerals (PRESERVISION AREDS) CAPS Take 1 capsule by mouth 2 (two) times daily.     [provider]  UNABLE TO FIND Bevacizumab(AVASTIN) chemo injection for the eye every 6 weeks    [provider]    Review of Systems    He denies chest pain, palpitations, dyspnea, pnd, orthopnea, n, v, dizziness, syncope, edema, weight gain, or early satiety.  All other systems reviewed and are otherwise negative except as noted above.  Physical Exam    VS:  BP (!) 220/100 (BP Location: Left Arm, Patient Position: Sitting, Cuff Size: Normal)   Pulse 75   Temp (!) 97 F (36.1 C)   Ht 5\' 11"  (1.803 m)   Wt 172 lb 8 oz (78.2 kg)   SpO2 97%   BMI 24.06 kg/m  , BMI Body mass index is 24.06 kg/m. GEN: Well nourished, well developed, in no acute distress. HEENT:  normal. Neck: Supple, no JVD, carotid bruits, or masses. Cardiac: RRR, 3/6 systolic ejection murmur heard throughout but loudest at the upper sternal borders.  No rubs, or gallops. No clubbing, cyanosis, edema.  Radials/PT 2+ and equal bilaterally.  Respiratory:  Respirations regular and unlabored, clear to auscultation bilaterally. GI: Soft, nontender, nondistended, BS + x 4. MS: no deformity or atrophy. Skin: warm and dry, no rash. Neuro:  Strength and sensation are intact. Psych: Normal affect.  Accessory Clinical Findings    ECG personally reviewed by me today -regular sinus rhythm, 75, PACs, right bundle branch block - no acute changes.  Lab Results  Component Value Date   WBC 13.1 (H) 12/29/2018   HGB 11.8 (L) 12/29/2018   HCT 37.2 (L) 12/29/2018   MCV 101.6 (H) 12/29/2018   PLT 284 12/29/2018   Lab Results  Component Value Date   CREATININE 1.14 12/29/2018   BUN 29 (H) 12/29/2018  NA 138 12/29/2018   K 4.4 12/29/2018   CL 105 12/29/2018   CO2 26 12/29/2018   Lab Results  Component Value Date   ALT 21 12/29/2018   AST 27 12/29/2018   ALKPHOS 35 (L) 12/29/2018   BILITOT 0.7 12/29/2018   Lab Results  Component Value Date   CHOL 152 07/05/2018   HDL 59 07/05/2018   LDLCALC 66 07/05/2018   TRIG 133 07/05/2018   CHOLHDL 2.6 07/05/2018    Lab Results  Component Value Date   HGBA1C 6.0 (H) 12/22/2018    Assessment & Plan    1.  Orthostatic hypotension with whitecoat hypertension: Patient with a long history of elevated in office blood pressures followed by significant orthostasis and presyncope at home following medication adjustments.  We have previously documented significant orthostasis.  His blood pressure in the office today is 220/100 however, he notes that his blood pressure earlier today was 126/76 within about an hour of taking his carvedilol.  Given stable blood pressures at home, I have advised that he continue to follow pressures and I will not make  any adjustments to his antihypertensive regimen today.  Patient will contact us if pressures begin trending higher at home.  2.  Paroxysmal atrial fibrillation: He remains anticoagulated on Eliquis.  He remains in sinus rhythm on carvedilol therapy.  No recent palpitations.  3.  Essential hypertension: See #1.  4.  Moderate aortic stenosis: Stable on echocardiogram in August 2020.  We will arrange for a follow-up echo this August.  5.  Disposition: Follow-up echo in August 2020.  Follow-up in clinic shortly thereafter.  Murray Hodgkins, NP 07/18/2019, 2:34 PM

## 2019-07-18 NOTE — Patient Instructions (Signed)
Medication Instructions:  Your physician recommends that you continue on your current medications as directed. Please refer to the Current Medication list given to you today.  *If you need a refill on your cardiac medications before your next appointment, please call your pharmacy*  Lab Work: None ordered  If you have labs (blood work) drawn today and your tests are completely normal, you will receive your results only by: [name] MyChart Message (if you have MyChart) OR . A paper copy in the mail If you have any lab test that is abnormal or we need to change your treatment, we will call you to review the results.  Testing/Procedures: 1- Echo  Please return to [facility] on ___________August 2021___ at _______________ AM/PM for an Echocardiogram. Your physician has requested that you have an echocardiogram. Echocardiography is a painless test that uses sound waves to create images of your heart. It provides your doctor with information about the size and shape of your heart and how well your heart's chambers and valves are working. This procedure takes approximately one hour. There are no restrictions for this procedure. Please note; depending on visual quality an IV may need to be placed.    Follow-Up: At [facility], you and your health needs are our priority.  As part of our continuing mission to provide you with exceptional heart care, we have created designated Provider Care Teams.  These Care Teams include your primary Cardiologist (physician) and Advanced Practice Providers (APPs -  Physician Assistants and Nurse Practitioners) who all work together to provide you with the care you need, when you need it.  Your next appointment:   6 month(s)  The format for your next appointment:   In Person  Provider:    You may see Kathlyn Sacramento, MD or Murray Hodgkins, NP.

## 2019-07-25 ENCOUNTER — Encounter: Payer: Self-pay | Admitting: Family Medicine

## 2019-07-25 DIAGNOSIS — G40909 Epilepsy, unspecified, not intractable, without status epilepticus: Secondary | ICD-10-CM

## 2019-07-25 HISTORY — DX: Epilepsy, unspecified, not intractable, without status epilepticus: G40.909

## 2019-07-25 NOTE — Patient Instructions (Addendum)
HEALTH MAINTENANCE RECOMMENDATIONS:  It is recommended that you get at least 30 minutes of aerobic exercise at least 5 days/week (for weight loss, you may need as much as 60-90 minutes). This can be any activity that gets your heart rate up. This can be divided in 10-15 minute intervals if needed, but try and build up your endurance at least once a week.  Weight bearing exercise is also recommended twice weekly.  Eat a healthy diet with lots of vegetables, fruits and fiber.  "Colorful" foods have a lot of vitamins (ie green vegetables, tomatoes, red peppers, etc).  Limit sweet tea, regular sodas and alcoholic beverages, all of which has a lot of calories and sugar.  Up to 2 alcoholic drinks daily may be beneficial for men (unless trying to lose weight, watch sugars).  Drink a lot of water.  Sunscreen of at least SPF 30 should be used on all sun-exposed parts of the skin when outside between the hours of 10 am and 4 pm (not just when at beach or pool, but even with exercise, golf, tennis, and yard work!)  Use a sunscreen that says "broad spectrum" so it covers both UVA and UVB rays, and make sure to reapply every 1-2 hours.  Remember to change the batteries in your smoke detectors when changing your clock times in the spring and fall. Carbon monoxide detectors are recommended for your home.  Use your seat belt every time you are in a car, and please drive safely and not be distracted with cell phones and texting while driving.    Jeffrey [name] , Thank you for taking time to come for your Medicare Wellness Visit. I appreciate your ongoing commitment to your health goals. Please review the following plan we discussed and let me know if I can assist you in the future.    This is a list of the screening recommended for you and due dates:  Health Maintenance  Topic Date Due  . Tetanus Vaccine  10/30/2014  . Eye exam for diabetics  08/29/2017  . Hemoglobin A1C  06/24/2019  . Complete foot exam    06/29/2019  . Urine Protein Check  06/29/2019  . Flu Shot  Completed  . Pneumonia vaccines  Completed   The foot exam, A1c, urine protein check were all performed at today's visit.  Technically, I believe you have had borderline diabetes, never treated with medications.  I know you are seeing the eye doctor regularly (technicallly didn't commend on whether or not there was any diabetic retinopathy, because you do not take medications for diabetes).  You are past due for getting a tetanus booster.  I encourage you to get a TdaP from the pharmacy (it is covered by Medicare Part D--if you do not have part D coverage, then ask about out of pocket cost at the pharmacy vs at our office, and you will need to pay for this).  I recommend getting the new shingles vaccine (Shingrix). You will need to get this from the pharmacy rather than our office, as it is covered by Medicare part D.  It is a series of 2 injections, spaced 2 months apart. If you do not have part D, [facility] is likely the least expensive place to get it, you can call and ask about cash price.  PLEASE  Restart using CPAP on a nightly basis.  This is very important for your heart, may improve your morning blood pressures also, and you will sleep better and be less  tired during the day.  Let us know if you would be interested in a referral to another urologist for a second opinion.  If not, please have the conversation with [name] about hesitation in having the procedure and wanting to know other treatment options.  Schedule a routine dental visit.  Please cut back on alcohol to no more than 1-2/day. Please try and get regular exercise as we discussed.

## 2019-07-25 NOTE — Progress Notes (Signed)
Chief Complaint  Patient presents with  . Medicare Wellness    fasting AWV. No new concerns.     Jeffrey [name] is a 86 y.o. male who presents for annual wellness visit and follow-up on chronic medical conditions.  He is accompanied by his wife. They report no specific complaints today.  Hypertension: He had his regimen adjusted by his cardiologist over the last 6 months.  He no longer takes lisinopril or amlodipine, and BP is controlled with carvedilol.  Last f/u with cardiology was earlier this month, and no changes were made.  The change was made related to some orthostasis when on all 3 meds (at lower carvedilol doses), as well as runs of VT and SVT noted on monitor.  Carvedilol was titrated up, other meds were stopped, and BP's at home have been okay (always high in the office), no longer having orthostasis.  BP's at home have been running 160-175/90-100 first thing in the morning, prior to medication.  When he rechecks it 30 minutes after his medication, BP is down to 125/80. He denies headaches, lightheadedness,chest pain, palpitations, shortness of breath, edema. Reports compliance with his medications.  Paroxysmal atrial fibrillation--He remains in NSR, and he remains on Eliquis without complication. Patient deniespalpitations, tachycardia, deniesany significant bleeding/bruising or side effects from medication. Last echocardiogram was in 12/2018 showed normal LV function with moderate pulmonary hypertension and moderate aortic stenosis. Plan is to recheck echo in August.  OSA and Pulmonary hypertension: He reports he hasn't used his CPAP since July, when he couldn't wear it related to his laceration repair.  He is waking up at 3am (to void) and can no longer get back to sleep.  He is now getting tired and napping during the day (some days none, other days 30 minute naps up to 1-3x/day). Wife reports loud snoring and pauses in his sleep.  Prior to July, he reported compliance with CPAP  use, previously stating he was using it 6 days/week, takes a break on one day, cleans the equipment.   DM/IFG--He doesn't check his sugars. He goes to the eye doctor regularly, for macular degeneration, getting injections every 10 weeks ([name]).   He denies any feet concerns, numbness/tingling or sores.  "My toes feel like I have extra skin or wearing socks all the time". This hasn't changed. He has urinary frequency x several years (related to prostate), no change; denies polydipsia. Lab Results  Component Value Date   HGBA1C 6.0 (H) 12/22/2018   Microalbuminuria noted in 04/2017, last check in 05/2018 was normal.   He is no longer on lisinopril, as stated above. Due for recheck.  Chart reviewed--looks like fasting sugars have been in the 120's, but I didn't see over 126, and I didn't see A1c of 6.5 or higher, so not really sure if he meets diagnosis of DM.  He is diet controlled.  Hyperlipidemia: Compliant with medications (4m of lovastatin); denies side effects. Tries to follow a lowfat, low cholesterol diet, cut back on fried foods. Lipids were at goal on last check.  Since the pandemic, not eating out, but reports eating more, wife cooking at home.  Eating more shrimp, ice cream. Eating fewer eggs since not eating out.  +sausage, occ bacon.  Some beef (3x/week). Admits to drinking more--2-3 drinks/day (mostly beer, sometimes tequila). He is due for recheck. Lab Results  Component Value Date   CHOL 152 07/05/2018   HDL 59 07/05/2018   LDLCALC 66 07/05/2018   TRIG 133 07/05/2018  CHOLHDL 2.6 07/05/2018   H/o stroke in 10/2014(right frontal white matter infarct in June 2016 secondary to small vessel disease). He has asymptomatic L carotid artery stenosis, and is monitored by [name] for this.Last ultrasound was 11/2017: Right Carotid: ICA with a 1-39% stenosis; Left Carotid: ICA with a 40-59% stenosis.  He reports seeing [name] regularly.  He saw [name] in 04/2018  after ER visit for alteration in mental status, suspecting complex partial seizures. He was started on Keppra XR 566m qd, and had EEG and MRI. EEG was normal (done twice, no epileptiform activity). 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  He continues on Keppra without any further staring spells or AMS. He last saw neuro in 01/2019, and no changes were made.  Prostate cancer: Monitored by Dr. DRosana Hoesevery 6 months.He underwent internal urethrotomy, cystourethroscopy and circumcision on 02/15/17. He reported not reallynoticing improvement since treatment of the stricture,continues with frequency and leakage.  His last visit with Dr. DRosana Hoeswas in June, 2020 and PSA was <0.01. Repeat cystoscopy was recommended to look for recurrent stricture, given persistent/worsening urinary frequency, dribbling and nocturia of 4-5x/night.  He is hesitant to do this, since it didn't help at all last time.  He prefers less invasive option, though doesn't sound like he asked/addressed this, just didn't schedule the procedure.   Immunization History  Administered Date(s) Administered  . Influenza Split 01/30/2012, 02/03/2014, 03/25/2015  . Influenza, High Dose Seasonal PF 03/17/2016, 03/04/2017, 02/22/2018  . Influenza-Unspecified 01/29/2013, 03/25/2015, 03/14/2019  . PFIZER SARS-COV-2 Vaccination 06/06/2019, 06/27/2019  . Pneumococcal Conjugate-13 10/17/2014  . Pneumococcal Polysaccharide-23 12/17/2004, 10/02/2012  . Td 10/29/2004   Tdap recommended the last 4 years (needs to get from pharmacy), hasn't gotten yet Last colonoscopy: 11/2012 with Dr. PHilarie Fredrickson(radiation proctitis)  Last PSA: per urologist, <0.01 11/2018  Dentist:twice yearly, past due related to COVID Ophtho: twice yearly, though now getting injections frequently (q10 wks per pt) Exercise:Walks the dogsusually 15 minutes 2x/day, daily. Admits it is not aerobic (stops/sniffs a lot). No other regular  exercise.   Other doctors caring for patient include:  GI--Dr. PHilarie Fredrickson Urologist: Dr. DRosana Hoes Dentist: Dr.Taloopis Ophtho: [facility] BValetta CloseCardiologist: Dr. AFletcher AnonOrtho: Dr. GBerenice Primas(no longer under his care; problem resolved)  Neuro: Dr. SLeonie ManPulmonary: Dr. SAlva GarnetVascular: Dr. FOneida AlarDerm: Dr. DEvorn Gongin BAthens  Depression screen: negative Fall screen: 1 fall (syncope), required stitches Functional status survey--notable for decreased hearing and vision(macular degeneration); leakage of urine, urge incontinence (wears Depends); Some memory issues (forgetting names of people he hasn't seen in a long time, sometimes forgetting why he walked into a room). Mini-Cog screen: normal See full screens in Epic  End of Life Discussion: He has been given forms in the past, but still hasn't done, Given another set and counseled last year, still has at home.   PMH, PSH, SH and FH were reviewed and updated  Outpatient Encounter Medications as of 07/26/2019  Medication Sig Note  . acetaminophen (TYLENOL ARTHRITIS PAIN) 650 MG CR tablet Take 1 tablet (650 mg total) by mouth every 8 (eight) hours as needed.   . betamethasone dipropionate (DIPROLENE) 0.05 % ointment Apply topically 2 (two) times daily. 12/27/2018: prn  . carvedilol (COREG) 12.5 MG tablet Take 12.5 mg by mouth 2 (two) times daily with a meal.   . ELIQUIS 5 MG TABS tablet TAKE ONE TABLET BY MOUTH TWICE DAILY   . levETIRAcetam (  KEPPRA XR) 500 MG 24 hr tablet TAKE ONE TABLET EVERY DAY   . lovastatin (MEVACOR) 40 MG tablet TAKE TWO TABLETS EACH NIGHT AT BEDTIME   . Multiple Vitamins-Minerals (PRESERVISION AREDS) CAPS Take 1 capsule by mouth 2 (two) times daily.    [name] UNABLE TO FIND Bevacizumab(AVASTIN) chemo injection for the eye every 10 weeks   . [DISCONTINUED] carvedilol (COREG) 6.25 MG tablet Take 1 tablet (6.25 mg total) by mouth 2 (two) times daily with a meal. (Patient taking differently: Take 12.5 mg  by mouth 2 (two) times daily with a meal. )   . loratadine (CLARITIN) 10 MG tablet Take by mouth.   . [DISCONTINUED] HYDROcodone-acetaminophen (NORCO) 5-325 MG tablet Take 1 tablet by mouth every 6 (six) hours as needed for moderate pain.    No facility-administered encounter medications on file as of 07/26/2019.   No Known Allergies  ROS: The patient denies anorexia, fever, weight changes, headaches, ear pain, hoarseness, chest pain, palpitations, dyspnea on exertion, cough, swelling, nausea, vomiting, diarrhea, constipation, abdominal pain, melena, hematochezia, indigestion/heartburn, dysuria, genital lesions, numbness, tingling, weakness, suspicious skin lesions, depression, anxiety, abnormal bleeding/bruising, or enlarged lymph nodes.  +bilateral hearing loss, worse per wife. Some chronic runny nose, unchanged Vision loss related to macular degeneration. Has some urge incontinence, some dribbling, and urinary frequency and nocturia. No bleeding, bruising. No further episodes staring spells since on Keppra Occasional mild knee pain   PHYSICAL EXAM:  BP (!) 204/90   Pulse 72   Temp 98 F (36.7 C) (Other (Comment))   Ht 5' 9.5" (1.765 m)   Wt 172 lb 3.2 oz (78.1 kg)   BMI 25.06 kg/m    Wt Readings from Last 3 Encounters:  07/18/19 172 lb 8 oz (78.2 kg)  03/09/19 170 lb (77.1 kg)  02/20/19 169 lb 6.4 oz (76.8 kg)   General Appearance:  Alert, cooperative, no distress, appears stated age  Head:  Normocephalic, without obvious abnormality, atraumatic   Eyes:  PERRL, conjunctiva/corneas clear, EOM's intact. Fundi not examined  Ears:  Normal TM's and external ear canals   Nose:  Not examined, wearing mask due to COVID-19 pandemic  Throat:  Not examined, wearing mask due to COVID-19 pandemic  Neck:  Supple, no lymphadenopathy; thyroid: no enlargement/tenderness/nodules; bruit noted on left.  Back:  Spine nontender, no curvature, ROM normal, no CVA tenderness    Lungs:  Clear to auscultation bilaterally without wheezes, rales or ronchi; respirations unlabored   Chest Wall:  No tenderness or deformity   Heart:  Regular rate.  There is frequent ectopy, at times difficult to say if irregularly irregular, but has some periods of definitely regular rhythm with extra beats; S1 and S2 normal, no rub or gallop. 3/6 SEM heard throughout, loudest at RUSB.  Breast Exam:  No chest wall tenderness, masses or gynecomastia   Abdomen:  Soft, non-tender, nondistended, normoactive bowel sounds, no masses, no hepatosplenomegaly. Central abdominal obesity  Genitalia:  Deferred to urologist   Rectal:  Deferred to urologist  Extremities:  No clubbing, cyanosis or edema.    Pulses:  2+ and symmetric all extremities   Skin:  Skin color, texture, turgor normal, no rashes or lesions; Large SK on right face  Lymph nodes:  Cervical, supraclavicular, and axillary nodes normal   Neurologic:  Normal strength, sensation (decreased slightly at R great toe) and gait; reflexes 2+ and symmetric throughout. Some mild resting tremor noted.   Psych: Normal mood, affect, hygiene and grooming.   Diabetic foot  exam performed--some discoloration and thickening of the nails. Left second toemail--very long, and wrapped around to the bottom of the toe.Normal monofilament exam, slight decrease at the R great toe only.    ASSESSMENT/PLAN:  Medicare annual wellness visit, subsequent  Microalbuminuria - no longer on lisinopril, recheck - Plan: Microalbumin / creatinine urine ratio  Pure hypercholesterolemia - on lovastatin. Some dietary changes over last year, due for recheck - Plan: Lipid panel  Essential hypertension, benign - white coat component, running okay at home, higher first thing in morning. Restart CPAP use. Cont carvedilol and monitoring BP at home - Plan: Comprehensive metabolic panel  Left carotid artery stenosis - remains  asymptomatic, on statin, anticoag.  Obstructive sleep apnea - noncompliant with CPAP use. Risks of untreated OSA reviewed, encouraged him to restart.  Pulmonary hypertension (HCC)  Personal history of prostate cancer - in remission, under the care of Dr. Garry Heater term current use of anticoagulant - no complications  Medication monitoring encounter - Plan: Comprehensive metabolic panel, CBC with Differential/Platelet, Lipid panel, Microalbumin / creatinine urine ratio  Paroxysmal atrial fibrillation (HCC) - on anticoagulant, asymptomatic  Aortic atherosclerosis (HCC) - cont current regimen  Aortic valve stenosis, etiology of cardiac valve disease unspecified - mod stenosis.  Reviewed s/sx of severe stenosis, and to f/u with cardiology if develops. Echo will be repeated 12/2019  Pre-diabetes - A1c improved. Reviewed proper diet, encouraged weight loss and increased exercise - Plan: HgB A1c, Comprehensive metabolic panel  Seizure disorder (HCC) - stable on Keppra per neuro  Tremor - mild, not bothersome or worsening  History of stroke - no further problems. Cont current regimen and BP control  Discussed diet, alcohol, IFG vs DM, OSA and risks if untreated. Counseled re: diet, exercise Discussed poss second opinion/referral to [facility] regarding his urinary issues.  He'll let us know if interested.  prediabetes/IFG--not really sure if he was ever truly dx'd with DM (could not find A1c >6.5 in chart, or fasting sugars of 126 or higher). c-met, cbc, lipids, urine microalb   Recommended at least 30 minutes of aerobic activity at least 5 days/week, weight-bearing exercise 2x/wk; proper sunscreen use reviewed; healthy diet and alcohol recommendations (1 drink/day or less preferred, occasionally 2) reviewed; regular seatbelt use; changing batteries in smoke detectors. Immunization recommendations --past due forTdap, advised of this x 4 years. Also recommended Shingrix, risks/side effects  reviewed.Advised that if he doesn't have Part D coverage, I recommend him paying for TdaP (either in our office vs at pharmacy). Can check pricing of Shingrix at [employer]. He thinks he now has part D. Colonoscopy recommendations reviewed--UTD.  Full Code, Full Care MOST form reviewed and updated Discussed living will and healthcare POA at length, and encouraged them to complete forms and get [place] copies.  Total visit time was 70 minutes face to face, with additional 10-15 minutes spent in chart review and documentation.  F/u 6 mos  Medicare Attestation I have personally reviewed: The patient's medical and social history Their use of alcohol, tobacco or illicit drugs Their current medications and supplements The patient's functional ability including ADLs,fall risks, home safety risks, cognitive, and hearing and visual impairment Diet and physical activities Evidence for depression or mood disorders  The patient's weight, height, BMI have been recorded in the chart.  I have made referrals, counseling, and provided education to the patient based on review of the above and I have provided the patient with a written personalized care plan for preventive services.

## 2019-07-26 ENCOUNTER — Ambulatory Visit (INDEPENDENT_AMBULATORY_CARE_PROVIDER_SITE_OTHER): Payer: Medicare Other | Admitting: Family Medicine

## 2019-07-26 ENCOUNTER — Other Ambulatory Visit: Payer: Self-pay

## 2019-07-26 ENCOUNTER — Encounter: Payer: Self-pay | Admitting: Family Medicine

## 2019-07-26 VITALS — BP 204/90 | HR 72 | Temp 98.0°F | Ht 69.5 in | Wt 172.2 lb

## 2019-07-26 DIAGNOSIS — G4733 Obstructive sleep apnea (adult) (pediatric): Secondary | ICD-10-CM

## 2019-07-26 DIAGNOSIS — Z5181 Encounter for therapeutic drug level monitoring: Secondary | ICD-10-CM | POA: Diagnosis not present

## 2019-07-26 DIAGNOSIS — E7800 Pure hypercholesterolemia, unspecified: Secondary | ICD-10-CM | POA: Diagnosis not present

## 2019-07-26 DIAGNOSIS — G40909 Epilepsy, unspecified, not intractable, without status epilepticus: Secondary | ICD-10-CM

## 2019-07-26 DIAGNOSIS — I10 Essential (primary) hypertension: Secondary | ICD-10-CM | POA: Diagnosis not present

## 2019-07-26 DIAGNOSIS — Z8673 Personal history of transient ischemic attack (TIA), and cerebral infarction without residual deficits: Secondary | ICD-10-CM

## 2019-07-26 DIAGNOSIS — Z0000 Encounter for general adult medical examination without abnormal findings: Secondary | ICD-10-CM

## 2019-07-26 DIAGNOSIS — R251 Tremor, unspecified: Secondary | ICD-10-CM

## 2019-07-26 DIAGNOSIS — I700 Atherosclerosis of aorta: Secondary | ICD-10-CM | POA: Diagnosis not present

## 2019-07-26 DIAGNOSIS — R7303 Prediabetes: Secondary | ICD-10-CM | POA: Diagnosis not present

## 2019-07-26 DIAGNOSIS — Z8546 Personal history of malignant neoplasm of prostate: Secondary | ICD-10-CM

## 2019-07-26 DIAGNOSIS — I480 Paroxysmal atrial fibrillation: Secondary | ICD-10-CM

## 2019-07-26 DIAGNOSIS — Z7901 Long term (current) use of anticoagulants: Secondary | ICD-10-CM

## 2019-07-26 DIAGNOSIS — R809 Proteinuria, unspecified: Secondary | ICD-10-CM | POA: Diagnosis not present

## 2019-07-26 DIAGNOSIS — I350 Nonrheumatic aortic (valve) stenosis: Secondary | ICD-10-CM | POA: Diagnosis not present

## 2019-07-26 DIAGNOSIS — I6522 Occlusion and stenosis of left carotid artery: Secondary | ICD-10-CM | POA: Diagnosis not present

## 2019-07-26 DIAGNOSIS — I272 Pulmonary hypertension, unspecified: Secondary | ICD-10-CM

## 2019-07-26 LAB — POCT GLYCOSYLATED HEMOGLOBIN (HGB A1C): Hemoglobin A1C: 5.5 % (ref 4.0–5.6)

## 2019-07-27 LAB — LIPID PANEL
Chol/HDL Ratio: 2.2 ratio (ref 0.0–5.0)
Cholesterol, Total: 156 mg/dL (ref 100–199)
HDL: 71 mg/dL (ref >39)
LDL Chol Calc (NIH): 65 mg/dL (ref 0–99)
Triglycerides: 112 mg/dL (ref 0–149)
VLDL Cholesterol Cal: 20 mg/dL (ref 5–40)

## 2019-07-27 LAB — CBC WITH DIFFERENTIAL/PLATELET
Basophils Absolute: 0 10*3/uL (ref 0.0–0.2)
Basos: 1 %
EOS (ABSOLUTE): 0.1 10*3/uL (ref 0.0–0.4)
Eos: 1 %
Hematocrit: 40.1 % (ref 37.5–51.0)
Hemoglobin: 13 g/dL (ref 13.0–17.7)
Immature Grans (Abs): 0 10*3/uL (ref 0.0–0.1)
Immature Granulocytes: 0 %
Lymphocytes Absolute: 2.5 10*3/uL (ref 0.7–3.1)
Lymphs: 28 %
MCH: 31.8 pg (ref 26.6–33.0)
MCHC: 32.4 g/dL (ref 31.5–35.7)
MCV: 98 fL — ABNORMAL HIGH (ref 79–97)
Monocytes Absolute: 1 10*3/uL — ABNORMAL HIGH (ref 0.1–0.9)
Monocytes: 11 %
Neutrophils Absolute: 5.1 10*3/uL (ref 1.4–7.0)
Neutrophils: 59 %
Platelets: 282 10*3/uL (ref 150–450)
RBC: 4.09 x10E6/uL — ABNORMAL LOW (ref 4.14–5.80)
RDW: 12.1 % (ref 11.6–15.4)
WBC: 8.7 10*3/uL (ref 3.4–10.8)

## 2019-07-27 LAB — COMPREHENSIVE METABOLIC PANEL
ALT: 17 IU/L (ref 0–44)
AST: 20 IU/L (ref 0–40)
Albumin/Globulin Ratio: 1.8 (ref 1.2–2.2)
Albumin: 4.6 g/dL (ref 3.6–4.6)
Alkaline Phosphatase: 47 IU/L (ref 39–117)
BUN/Creatinine Ratio: 19 (ref 10–24)
BUN: 17 mg/dL (ref 8–27)
Bilirubin Total: 0.6 mg/dL (ref 0.0–1.2)
CO2: 24 mmol/L (ref 20–29)
Calcium: 9.3 mg/dL (ref 8.6–10.2)
Chloride: 102 mmol/L (ref 96–106)
Creatinine, Ser: 0.89 mg/dL (ref 0.76–1.27)
GFR calc Af Amer: 91 mL/min/{1.73_m2} (ref >59)
GFR calc non Af Amer: 79 mL/min/{1.73_m2} (ref >59)
Globulin, Total: 2.6 g/dL (ref 1.5–4.5)
Glucose: 113 mg/dL — ABNORMAL HIGH (ref 65–99)
Potassium: 4.4 mmol/L (ref 3.5–5.2)
Sodium: 144 mmol/L (ref 134–144)
Total Protein: 7.2 g/dL (ref 6.0–8.5)

## 2019-07-27 LAB — MICROALBUMIN / CREATININE URINE RATIO
Creatinine, Urine: 180.4 mg/dL
Microalb/Creat Ratio: 31 mg/g creat — ABNORMAL HIGH (ref 0–29)
Microalbumin, Urine: 56.4 ug/mL

## 2019-08-20 ENCOUNTER — Ambulatory Visit: Payer: Medicare Other | Admitting: Neurology

## 2019-08-27 ENCOUNTER — Other Ambulatory Visit: Payer: Self-pay | Admitting: Neurology

## 2019-09-03 ENCOUNTER — Ambulatory Visit (INDEPENDENT_AMBULATORY_CARE_PROVIDER_SITE_OTHER): Payer: Medicare Other | Admitting: Neurology

## 2019-09-03 ENCOUNTER — Ambulatory Visit: Payer: Medicare Other | Admitting: Neurology

## 2019-09-03 ENCOUNTER — Encounter: Payer: Self-pay | Admitting: Neurology

## 2019-09-03 ENCOUNTER — Other Ambulatory Visit: Payer: Self-pay

## 2019-09-03 VITALS — BP 193/98 | HR 80 | Temp 97.9°F | Ht 71.0 in | Wt 170.4 lb

## 2019-09-03 DIAGNOSIS — R569 Unspecified convulsions: Secondary | ICD-10-CM

## 2019-09-03 DIAGNOSIS — I6522 Occlusion and stenosis of left carotid artery: Secondary | ICD-10-CM | POA: Diagnosis not present

## 2019-09-03 MED ORDER — LEVETIRACETAM ER 500 MG PO TB24: 1000.0000 mg | ORAL_TABLET | Freq: Every day | ORAL | 3 refills | Status: DC

## 2019-09-03 NOTE — Patient Instructions (Signed)
I had a long discussion with the patient and his wife regarding his recent episode of transient altered awareness neuro presenting a breakthrough complex partial seizure.  I recommend increasing the dose of Keppra XRr to 1000 mg at night.  If patient is unable to tolerate this may consider reducing it slightly.  I discussed possible side effects with the patient advised him to call me if needed.  Check EEG for detecting silent seizures.  Continue Eliquis for stroke prevention given history of atrial fibrillation and maintain aggressive risk factor modification with strict control of hypertension with blood pressure goal below 130/90, lipids with LDL cholesterol goal below 70 mg percent.  Patient was advised to see his primary care physician as his blood pressure appears to be significantly elevated.  He will return for follow-up in the future in 6 months or call earlier if necessary.

## 2019-09-03 NOTE — Progress Notes (Signed)
[facility] [address] [place]. [place] [address] [phone]       OFFICE FOLLOW UP NOTE  Jeffrey [name] Date of Birth:  [birth year] Medical Record Number:  [id number]   Referring MD:   Rudene Re Reason for visit: Seizures [facility] provider: [name]   Chief Complaint  Patient presents with  . Follow-up    Stroke and seizure follow up room 1 with wife [name]     HPI:   Initial visit 05/02/2018 PS: Mr Eshbach is a pleasant 86 year old Caucasian male was seen today for initial office consultation visit.  He is accompanied by his wife and daughter.  History is obtained from them and review of referral notes.  I have reviewed imaging films in PACS.  He has been having multiple brief episodes of altered awareness for several years but these appear to have increased in the last 6 months and frequency as well as duration.  He had a prolonged episode recently in November 2019 when he went to the ER.  CT scan of the head was obtained which I reviewed showed no acute abnormality except changes of age-related small vessel disease.  He is recently had a few back-to-back episodes as well.  He is unable to identify specific trigger for his episodes.  He is usually staring during these episodes and does not respond to commands.  He does not lose consciousness fall or hurt himself.  His episodes were previously lasting less than a minute the dose of few recent ones have been more prolonged.  Following some of these episodes he tends to get agitated and disoriented.  The patient's daughter feels she is noticed that he does some automatic hand movements and some of these episodes.  He denies any headache or any aura prior to these episodes.  He has no remote history of head injury with loss of consciousness intracranial hemorrhage or strokes.  There is no family history of epilepsy or seizures.  There is no specific pattern for these episodes which may occur once every few months to  several in a week.  He has not had an EEG or MRI scan done or a trial of seizure medications. He had prior history of right anterior frontal lacunar infarct in 11/27/2014 he was felt to have asymptomatic 80 to 90% left ICA stenosis at that time.  Echocardiogram was normal.  He has remote history of [facility] spotted fever with encephalitis.  He was treated with a 2-week course of doxycycline.  He did not have any definite seizures at that time.  He had episode of brief staring with a blank look on his face in May 2016 and was admitted to [facility] where he had an EEG which was negative for seizures.  He was seen by Dr. Theador Hawthorne neurologist as an outpatient and had a negative work-up for his seizures as well.  He was referred to Dr. Juanda Crumble feels vascular surgeon for left carotid stenosis follow-up with ultrasound in the office showed stenosis to be much less and hence conservative follow-up was recommended.  He was seen by me in the office in March 2017 but has not followed up since then.  He was found to subsequently have a transient episode of paroxysmal A. fib and was started on Eliquis by cardiologist [name].  He also had a second EEG done in September 2016 which was also unremarkable except for mild generalized slowing. Update 08/07/2018 PS : He returns for follow-up after  last visit 3 months ago.  He is accompanied by his wife.  He states he is doing well.  Is tolerating Keppra XR 500 mg quite well without any side effects.  He has had no spells like seizures or speech difficulties since the last visit.  He has had no stroke or TIA symptoms either.  He underwent EEG on 06/12/2018 which was read by me and was normal.  MRI scan of the brain done on 06/14/2018 which I have personally reviewed the images shows tiny right posterior temporal white matter lacunar infarct which was clinically silent as patient had no symptoms at that time.  Carotid ultrasound previously done on 12/23/2017 had  shown 40 to 59% left ICA stenosis no significant stenosis on the right.  Transthoracic echo done on 04/05/2018 and showed normal ejection fraction.  Lipid profile done on 07/05/2018 was unremarkable with LDL cholesterol of 66 mg percent, HDL of 59 and total cholesterol 152 mg percent.  Hemoglobin A1c on 06/28/2018 was 6.0.  Patient has been diagnosed with sleep apnea by his pulmonologist Dr Leonidas Romberg and has started using CPAP every night recently.  He has no new complaints today.  He states his tremors are unchanged and are not functionally disabling.  He does follow-up with [name] from vascular surgery for carotid stenosis and will have follow-up carotid ultrasound done in upcoming visit in the spring  Update 02/20/2019: Mr. Verdier is a 86 year old male who is being seen today for follow-up accompanied by wife.  He has been doing well from a neurological standpoint.  He continues on Keppra XR 500 mg daily without recurrent seizure episodes or speech difficulties.  He did have a ED visit on 12/29/2018 for syncopal episode which was felt likely due to orthostatic hypotension with cardiology follow-up and medications adjusted.  Syncopal episode was not felt to be related to seizures.  Denies new or recurring stroke/TIA symptoms as well.  Upper extremity L>R tremors have been stable and do not interfere with daily functioning.  He remains on Eliquis without bleeding or bruising.  He endorses ongoing compliance with CPAP for OSA management.  Blood pressure today elevated at 189/90 but does endorse elevation at doctor's appointments.  He does monitor at home and typically 120s/70s.  Denies new or worsening stroke/TIA symptoms.  Update 09/03/2019 : He returns for follow-up after last visit 6 months ago.  He states he is doing well but wife states that a few weeks ago she noticed episode of transient unresponsiveness where he was staring ahead and not immediately responsive this lasted only a few moments and then returned  back to baseline but patient did not remember that.  There were no automatic movements or lipsmacking noted.  There is no confusion or disorientation or headache.  There is no apparent trigger.  He remains on Keppra Exar 500 mg daily and is tolerating well without side effects.  He remains on Eliquis for his A. fib and has not had any stroke or TIA symptoms.  His blood pressure is significantly elevated today with the patient's wife claims that this on whitecoat hypertension as it is much better at home.  He continues to have intermittent hand tremor but this is not functionally disabling.  He has no other new complaints.   ROS:   14 system review of systems is positive for tremors, staring and unresponsive episode denies and all other systems negative  PMH:  Past Medical History:  Diagnosis Date  . Acute CVA (cerebrovascular accident) ([place])   .  Acute encephalopathy 11/27/2014  . Aortic atherosclerosis ([place]) 11/25/2014  . Atrial fibrillation ([place])   . Chronic combined systolic (congestive) and diastolic (congestive) heart failure ([place])    a. 10/2014 Echo: EF 40-45%; b. 03/2018 Echo: EF 55-60%; c. 12/2018 Echo: EF 55-60%, mild conc LVH. Nl RV fxn. RVSP 51.71mmHg. Mildly dil LA. Mod AS w/ severe Ca2+.  . Diabetes mellitus   . Heart murmur    mild aortic stenosis  . Hypercholesteremia   . Hypertension   . Impaired fasting glucose   . Macular degeneration   . Moderate aortic stenosis    a. 12/2018 Echo: EF 55-60%, Mod AS w/ sev AoV Ca2+.  . Multiple lacunar infarcts ([place])   . Obstructive sleep apnea 06/27/2018  . Prostate cancer ([place]) radiation + seeding implant (Dr.[name])  . 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 Hilarie Fredrickson)  . [facility] spotted fever) 10/2014   (hosp with FUO, encephalopathy)  . Rotator cuff tear, right 10/2010   supraspinatous and infraspinatous  . Seizure disorder ([place]) 07/25/2019  . TIA (transient ischemic attack) 10/02/13    hosp at [facility] x 1 night  . 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 Marital status: Married    Spouse name: Not on file  . Number of children: 4  . Years of education: Not on file  . Highest education level: Not on file  Occupational History  . Occupation: Retired  Tobacco Use  . Smoking status: Former Smoker    Packs/day: 1.00    Years: 20.00    Pack years: 20.00    Types: Cigarettes    Quit date: 05/31/1980    Years since quitting: 39.2  . Smokeless tobacco: Never Used  Substance and Sexual Activity  . Alcohol use: Yes    Alcohol/week: 14.0 standard drinks    Types: 14 Cans of beer per week    Comment: 2 cans of beer most days of the week  . Drug use: No  . Sexual activity: Not Currently    Partners: Female    Comment: issues with ED  Other Topics Concern  . Not on file  Social History Narrative   Married. Retired Engineer, structural.  2 sons, 2 daughters (all in [place]), 15 grandchildren, 7 great grandchild, 2 more expected   Social Determinants of Health   Financial Resource Strain:   . Difficulty of Paying Living Expenses:   Food Insecurity:   . Worried About Charity fundraiser in the Last Year:   . Arboriculturist in the Last Year:   Transportation Needs:   . Film/video editor (Medical):   [name] Lack of Transportation (Non-Medical):   Physical Activity:   . Days of Exercise per Week:   . Minutes of Exercise per Session:   Stress:   . Feeling of Stress :   Social Connections:   . Frequency of Communication with Friends and Family:   . Frequency of Social Gatherings with Friends and Family:   . Attends Religious Services:   . Active Member of Clubs or Organizations:   . Attends Archivist Meetings:   [name] Marital Status:   Intimate Partner Violence:   . Fear of Current or Ex-Partner:   . Emotionally Abused:   [name] Physically Abused:   . Sexually Abused:      Medications:   Current Outpatient Medications  on File Prior to Visit  Medication Sig Dispense Refill  . acetaminophen (TYLENOL ARTHRITIS PAIN) 650 MG CR tablet Take 1 tablet (650 mg total) by mouth every 8 (eight) hours as needed.    . betamethasone dipropionate (DIPROLENE) 0.05 % ointment Apply topically 2 (two) times daily.    . carvedilol (COREG) 12.5 MG tablet Take 12.5 mg by mouth 2 (two) times daily with a meal.    . ELIQUIS 5 MG TABS tablet TAKE ONE TABLET BY MOUTH TWICE DAILY 180 tablet 1  . loratadine (CLARITIN) 10 MG tablet Take by mouth.    . lovastatin (MEVACOR) 40 MG tablet TAKE TWO TABLETS EACH NIGHT AT BEDTIME 180 tablet 0  . Multiple Vitamins-Minerals (PRESERVISION AREDS) CAPS Take 1 capsule by mouth 2 (two) times daily.     [name] UNABLE TO FIND Bevacizumab(AVASTIN) chemo injection for the eye every 10 weeks     No current facility-administered medications on file prior to visit.    Allergies:  No Known Allergies  Physical Exam  Today's Vitals   09/03/19 1323  BP: (!) 193/98  Pulse: 80  Temp: 97.9 F (36.6 C)  Weight: 77.3 kg  Height: 5\' 11"  (1.803 m)   Body mass index is 23.77 kg/m.   General: Pleasant frail elderly Caucasian male seated, in no evident distress Head: head normocephalic and atraumatic.   Neck: supple with soft left  carotid   bruit Cardiovascular: regular rate and rhythm, no murmurs Musculoskeletal: no deformity Skin:  no rash/petichiae Vascular:  Normal pulses all extremities  Neurologic Exam Mental Status: Awake and fully alert. Oriented to place and time. Recent and remote memory intact. Attention span, concentration and fund of knowledge appropriate. Mood and affect appropriate.   Cranial Nerves: Pupils equal, briskly reactive to light. Extraocular movements full without nystagmus. Visual fields full to confrontation. Hearing diminished bilaterally. Facial sensation intact. Face, tongue, palate moves normally and symmetrically.  Motor:  Normal bulk and tone. Normal strength in all tested extremity muscles. Mild to moderate action tremor of outstretched upper extremities left greater than right.  Intermittent head tremor.  Mild Cogwheel rigidity left greater than right wrist upon activation mainly.  No bradykinesia.   Sensory.: intact to touch , pinprick , position and vibratory sensation.  Coordination: Rapid alternating movements normal in all extremities. Finger-to-nose and heel-to-shin performed accurately bilaterally. Gait and Station: Arises from chair without difficulty. Stance is normal. Gait demonstrates normal stride length and balance but slight diminished left arm swing. No festination no stooped posture.. Able to heel, toe and tandem walk with moderate difficulty.  Reflexes: 1+ and symmetric. Toes downgoing.      ASSESSMENT: 86 year long-standing recurrent transient stereotypical episodes of altered awareness with speech difficulties possibly complex partial seizures under episode a few weeks ago possibly breakthrough seizure.  He has previously had EEG done twice which has been negative for epileptiform activity.  He also has mild left greater than right upper extremity and head tremor possibly early parkinsonian tremor vs essential tremor which does not appear to be functionally disabling     PLAN: I had a long discussion with the patient and his wife regarding his recent episode of transient altered awareness neuro presenting a breakthrough complex partial seizure.  I recommend increasing the dose of Keppra XRr to 1000 mg at night.  If patient is unable to tolerate this may consider reducing it slightly.  I discussed possible side effects with the patient advised him to call me if needed.  Check  EEG for detecting silent seizures.  Continue Eliquis for stroke prevention given history of atrial fibrillation and maintain aggressive risk factor modification with strict control of hypertension with blood pressure goal below  130/90, lipids with LDL cholesterol goal below 70 mg percent.  Patient was advised to see his primary care physician as his blood pressure appears to be significantly elevated.  He will return for follow-up in the future in 6 months or call earlier if necessary.needed Greater than 50% time during this 25-minute  visit was spent on counseling and coordination of care and discussion about complex partial seizures and parkinsonian vs essential tremor and answering questions   Antony Contras, MD [facility] [address] [place], [place] [address]  Phone [phone] Fax [phone] Note: This document was prepared with digital dictation and possible smart phrase technology. Any transcriptional errors that result from this process are unintentional.

## 2019-09-18 ENCOUNTER — Other Ambulatory Visit: Payer: Self-pay | Admitting: Cardiovascular Disease

## 2019-09-18 NOTE — Telephone Encounter (Signed)
Eliquis 5mg  refill request received. Patient is 86 years old, weight-77.3kg, Crea-0.89 on 07/26/2019, Diagnosis-Afib, and last seen by Ignacia Bayley on 07/18/2019. Dose is appropriate based on dosing criteria. Will send in refill to requested pharmacy.

## 2019-09-18 NOTE — Telephone Encounter (Signed)
Please review for refill. Thanks!  

## 2019-09-27 DIAGNOSIS — H353231 Exudative age-related macular degeneration, bilateral, with active choroidal neovascularization: Secondary | ICD-10-CM | POA: Diagnosis not present

## 2019-09-27 DIAGNOSIS — Z961 Presence of intraocular lens: Secondary | ICD-10-CM | POA: Diagnosis not present

## 2019-09-27 DIAGNOSIS — H25812 Combined forms of age-related cataract, left eye: Secondary | ICD-10-CM | POA: Diagnosis not present

## 2019-09-27 DIAGNOSIS — H43813 Vitreous degeneration, bilateral: Secondary | ICD-10-CM | POA: Diagnosis not present

## 2019-09-28 ENCOUNTER — Other Ambulatory Visit: Payer: Self-pay | Admitting: Cardiovascular Disease

## 2019-10-15 ENCOUNTER — Other Ambulatory Visit: Payer: Self-pay | Admitting: Family Medicine

## 2019-10-15 DIAGNOSIS — E7800 Pure hypercholesterolemia, unspecified: Secondary | ICD-10-CM

## 2019-11-05 ENCOUNTER — Ambulatory Visit (INDEPENDENT_AMBULATORY_CARE_PROVIDER_SITE_OTHER): Payer: Medicare Other | Admitting: Neurology

## 2019-11-05 DIAGNOSIS — R569 Unspecified convulsions: Secondary | ICD-10-CM

## 2019-11-16 NOTE — Progress Notes (Signed)
Kindly inform the patient that EEG study was normal

## 2019-11-20 ENCOUNTER — Encounter: Payer: Self-pay | Admitting: *Deleted

## 2020-01-03 DIAGNOSIS — H353231 Exudative age-related macular degeneration, bilateral, with active choroidal neovascularization: Secondary | ICD-10-CM | POA: Diagnosis not present

## 2020-01-08 ENCOUNTER — Other Ambulatory Visit: Payer: Self-pay

## 2020-01-08 ENCOUNTER — Ambulatory Visit (INDEPENDENT_AMBULATORY_CARE_PROVIDER_SITE_OTHER): Payer: Medicare Other

## 2020-01-08 DIAGNOSIS — I350 Nonrheumatic aortic (valve) stenosis: Secondary | ICD-10-CM | POA: Diagnosis not present

## 2020-01-08 LAB — ECHOCARDIOGRAM COMPLETE
AR max vel: 0.86 cm2
AV Area VTI: 0.77 cm2
AV Area mean vel: 0.84 cm2
AV Mean grad: 23 mmHg
AV Peak grad: 35.3 mmHg
Ao pk vel: 2.97 m/s
Area-P 1/2: 3.87 cm2
Calc EF: 48.9 %
S' Lateral: 3.5 cm
Single Plane A2C EF: 49.5 %
Single Plane A4C EF: 51.2 %

## 2020-01-09 ENCOUNTER — Telehealth: Payer: Self-pay

## 2020-01-09 NOTE — Telephone Encounter (Signed)
Call attempted. No answer, no vm available.

## 2020-01-09 NOTE — Telephone Encounter (Signed)
-----   Message from Rise Mu, PA-C sent at 01/09/2020  7:11 AM EDT ----- Echo showed low-normal pump function, mild thickening of the heart, mild stiffening of the heart, severely elevated pressure in the right side of the heart, mildly leaky mitral valve, mild to moderately leaky tricuspid valve, trivially leaky aortic valve, moderate to severely narrowed aortic valve.   Compared to prior echo, pump function remains stable, the heart is a little more stiff, the pressure in the right side of the heart is greater, and the aortic valve is a little more narrowed.   Recommendations: -Keep appointment later this month to assess his symptoms and for evaluation for further testing-Optimal BP control

## 2020-01-18 ENCOUNTER — Ambulatory Visit: Payer: Medicare Other | Admitting: Family

## 2020-01-18 ENCOUNTER — Ambulatory Visit: Payer: Medicare Other | Admitting: Cardiovascular Disease

## 2020-01-23 ENCOUNTER — Encounter: Payer: Self-pay | Admitting: Nurse Practitioner

## 2020-01-23 ENCOUNTER — Other Ambulatory Visit: Payer: Self-pay

## 2020-01-23 ENCOUNTER — Ambulatory Visit (INDEPENDENT_AMBULATORY_CARE_PROVIDER_SITE_OTHER): Payer: Medicare Other | Admitting: Nurse Practitioner

## 2020-01-23 VITALS — BP 126/80 | HR 62 | Ht 71.0 in | Wt 157.6 lb

## 2020-01-23 DIAGNOSIS — I6522 Occlusion and stenosis of left carotid artery: Secondary | ICD-10-CM

## 2020-01-23 DIAGNOSIS — I2721 Secondary pulmonary arterial hypertension: Secondary | ICD-10-CM

## 2020-01-23 DIAGNOSIS — I10 Essential (primary) hypertension: Secondary | ICD-10-CM | POA: Diagnosis not present

## 2020-01-23 DIAGNOSIS — Z79899 Other long term (current) drug therapy: Secondary | ICD-10-CM

## 2020-01-23 DIAGNOSIS — I350 Nonrheumatic aortic (valve) stenosis: Secondary | ICD-10-CM

## 2020-01-23 DIAGNOSIS — I480 Paroxysmal atrial fibrillation: Secondary | ICD-10-CM

## 2020-01-23 NOTE — Progress Notes (Signed)
Office Visit    Patient Name: Jeffrey [name] Date of Encounter: 01/23/2020  Primary Care Provider:  Rita Ohara, MD Primary Cardiologist:  Kathlyn Sacramento, MD  Chief Complaint    86 year old male with a history of paroxysmal atrial fibrillation, hypertension, hyperlipidemia, TIA/stroke, moderate to severe aortic stenosis, moderate left carotid stenosis, borderline diabetes, and [facility] spotted fever, who presents for follow-up of aortic stenosis.  Past Medical History    Past Medical History:  Diagnosis Date  . Acute CVA (cerebrovascular accident) ([place])   . Acute encephalopathy 11/27/2014  . Aortic atherosclerosis ([place]) 11/25/2014  . Atrial fibrillation ([place])    a. 12/2018 Zio: Sinus rhythm, avg rate 71. 2 runs of VT (max 17 beats). 27 runs SVT (max 14 beats). Occas PACs/PVCs. No high grade AV block/pauses.  . Chronic combined systolic (congestive) and diastolic (congestive) heart failure ([place])    a. 10/2014 Echo: EF 40-45%; b. 03/2018 Echo: EF 55-60%; c. 12/2018 Echo: EF 55-60%, mild conc LVH. Nl RV fxn. RVSP 51.47mmHg. Mildly dil LA. Mod AS w/ severe Ca2+; d. 12/2019 Echo: EF 50-55%, mild LVH, gr2 DD. Sev elev PASP. Mild MR, mild to mod TR. Triv AI, mod to sev AS (area 0.77cm^2, mean grad 27mmHg. Vmax 2.74m/s).  . Diabetes mellitus   . Hypercholesteremia   . Hypertension   . Impaired fasting glucose   . Macular degeneration   . Moderate to severe aortic stenosis    a. 12/2018 Echo: EF 55-60%, Mod AS w/ sev AoV Ca2+; b. 12/2019 Echo: EF 50-55%. Mod to sev AS (area 0.77cm^2, mean grad 90mmHg. Vmax 2.36m/s).  . Multiple lacunar infarcts ([place])   . Obstructive sleep apnea 06/27/2018  . Prostate cancer ([place]) radiation + seeding implant (Dr.[name])  . 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 Hilarie Fredrickson)  . 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 Rotator cuff tear, right 10/2010   supraspinatous and  infraspinatous  . Seizure disorder ([place]) 07/25/2019  . TIA (transient ischemic attack) 10/02/13   hosp at [facility] x 1 night  . Type 2 diabetes mellitus with microalbuminuria or microproteinuria 01/15/2014   Past Surgical History:  Procedure Laterality Date  . CATARACT EXTRACTION Right 01/2017  . CIRCUMCISION  01/2017   [name]  . COLONOSCOPY N/A 11/28/2012   Procedure: COLONOSCOPY;  Surgeon: Jerene Bears, MD;  Location: [facility] ENDOSCOPY;  Service: Gastroenterology;  Laterality: N/A;  . INTERNAL URETHROTOMY  01/27/2016   WF  . prostate seed implant    . ROTATOR CUFF REPAIR  06/16/2011   right (Dr. Berenice Primas)  . TONSILLECTOMY AND ADENOIDECTOMY  age 30    Allergies  No Known Allergies  History of Present Illness    86 year old male with above complex past medical history including paroxysmal atrial fibrillation, hypertension, hyperlipidemia, TIA/stroke (June 2016), moderate to severe aortic stenosis, moderate left carotid stenosis, borderline diabetes, obstructive sleep apnea on CPAP, and [place] spotted fever.  He had a nonischemic stress test in August 2016.  In July 2020, he was seen in the emergency department with a possible syncopal episode in the setting of low blood pressures.  At follow-up visit in August 2020, he was hypertensive but also very orthostatic.  An echocardiogram was performed and showed normal LV function with moderate pulmonary hypertension and moderate aortic stenosis.  Zio patch showed sinus rhythm with an average heart rate of 71 bpm.  There were two runs of VT,  longest 17 beats.  He also 27 runs of SVT, the longest 14 beats.  Given frequent ectopy and orthostasis, lisinopril and amlodipine were discontinued in favor of titration of beta-blocker therapy.  When he was seen in clinic in February of this year, he was hypertensive (220/100) however, reported home blood pressures of 120s over 70s to 80s (126/76 earlier that morning after taking a.m. meds).  We agreed to continue his  home regimen and he would follow pressures at home.  Earlier this month, he had a repeat echo to evaluate aortic stenosis and this progressed to moderate-severe in nature with a valve area of 0.77 cm, mean gradient of 23 mmHg, and V-max of 2.97 m/s.  Over the past 6 months, he reports stable, chronic dyspnea on exertion.  He denies any chest pain, palpitations, PND, orthopnea, presyncope, syncope, edema, or early satiety.  He notes that in the setting of the ongoing pandemic, his activity level has dropped significantly.  He notes that in the setting of the ongoing pandemic, his activity level has decreased significantly.  He finds himself picking at more snack foods but not necessarily eating a lot.  He has lost 13 pounds over the past 6 months.  Home Medications    Prior to Admission medications   Medication Sig Start Date End Date Taking? Authorizing Provider  acetaminophen (TYLENOL ARTHRITIS PAIN) 650 MG CR tablet Take 1 tablet (650 mg total) by mouth every 8 (eight) hours as needed. 12/10/14   Love, Ivan Anchors, PA-C  betamethasone dipropionate (DIPROLENE) 0.05 % ointment Apply topically 2 (two) times daily.    [provider]  carvedilol (COREG) 6.25 MG tablet Take 1 tablet (6.25 mg total) by mouth 2 (two) times daily with a meal. 09/28/19   Dunn, Ryan M, PA-C  ELIQUIS 5 MG TABS tablet TAKE ONE TABLET TWICE DAILY 09/18/19   Wellington Hampshire, MD  levETIRAcetam (KEPPRA XR) 500 MG 24 hr tablet Take 2 tablets (1,000 mg total) by mouth daily. 09/03/19   Garvin Fila, MD  loratadine (CLARITIN) 10 MG tablet Take by mouth.    [provider]  lovastatin (MEVACOR) 40 MG tablet TAKE TWO TABLETS EACH NIGHT AT BEDTIME 10/15/19   Rita Ohara, MD  Multiple Vitamins-Minerals (PRESERVISION AREDS) CAPS Take 1 capsule by mouth 2 (two) times daily.     [provider]  UNABLE TO FIND Bevacizumab(AVASTIN) chemo injection for the eye every 10 weeks    [provider]    Review of  Systems    Chronic, stable dyspnea on exertion.  He denies chest pain, palpitations, PND, orthopnea, dizziness, syncope, edema, or early satiety.  All other systems reviewed and are otherwise negative except as noted above.  Physical Exam    VS:  BP 126/80 (BP Location: Left Arm, Patient Position: Sitting, Cuff Size: Normal)   Pulse 62   Ht 5\' 11"  (1.803 m)   Wt 157 lb 9.6 oz (71.5 kg)   SpO2 92%   BMI 21.98 kg/m  , BMI Body mass index is 21.98 kg/m. GEN: Well nourished, well developed, in no acute distress. HEENT: normal. Neck: Supple, no JVD, carotid bruits, or masses. Cardiac: RRR, 3/6 systolic ejection murmur loudest at the upper sternal borders but heard throughout.  No rubs, or gallops. No clubbing, cyanosis, edema.  Radials/PT 2+ and equal bilaterally.  Respiratory:  Respirations regular and unlabored, clear to auscultation bilaterally. GI: Soft, nontender, nondistended, BS + x 4. MS: no deformity or atrophy. Skin: warm, very  dry with flaking of his lower extremities.  No rash. Neuro:  Strength and sensation are intact. Psych: Normal affect.  Accessory Clinical Findings    ECG personally reviewed by me today -regular sinus rhythm, 62, left axis deviation, left anterior fascicular block, right bundle branch block- no acute changes.  Lab Results  Component Value Date   WBC 8.7 07/26/2019   HGB 13.0 07/26/2019   HCT 40.1 07/26/2019   MCV 98 (H) 07/26/2019   PLT 282 07/26/2019   Lab Results  Component Value Date   CREATININE 0.89 07/26/2019   BUN 17 07/26/2019   NA 144 07/26/2019   K 4.4 07/26/2019   CL 102 07/26/2019   CO2 24 07/26/2019   Lab Results  Component Value Date   ALT 17 07/26/2019   AST 20 07/26/2019   ALKPHOS 47 07/26/2019   BILITOT 0.6 07/26/2019   Lab Results  Component Value Date   CHOL 156 07/26/2019   HDL 71 07/26/2019   LDLCALC 65 07/26/2019   TRIG 112 07/26/2019   CHOLHDL 2.2 07/26/2019    Lab Results  Component Value Date   HGBA1C  5.5 07/26/2019    Assessment & Plan    1.  Moderate to severe aortic stenosis: Recent echo with worsening of aortic stenosis.  Valve area measured at 0.77 cm (VTI) though the mean gradient is only 23 mmHg (peak grad 5mmHg).  He has chronic, stable dyspnea exertion which has been unchanged.  He denies any chest pain, presyncope, or syncope.  We discussed the long-term indications of progressive aortic stenosis, of which he was not currently aware.  I will arrange for a follow-up 2D echocardiogram in 6 months with office visit at that time.  He knows to contact us for any progression of dyspnea on exertion or development of chest pain, presyncope/syncope.  He would be open to referral to our structural heart team when the time comes.  I did discuss his case with our structural heart team today.  2.  Paroxysmal atrial fibrillation: Maintaining sinus rhythm.  He remains on beta-blocker and Eliquis therapy.  Follow-up CBC and basic metabolic panel today as it has been 6 months.  3.  PAH:  RVSP of 63mmHg on recent echo.  Up from 51.5 last year.  He is euvolemic on exam and reports stable DOE.  We discussed the importance of daily weights, sodium restriction, medication compliance, and symptom reporting and he verbalizes understanding.   4. Essential hypertension: Stable.  5.  History of orthostatic hypotension: Stable.  6.  Disposition: Follow-up CBC and basic metabolic panel today.  Follow-up echocardiogram in 6 months with office follow-up at that time.   Murray Hodgkins, NP 01/23/2020, 11:15 AM

## 2020-01-23 NOTE — Patient Instructions (Signed)
Medication Instructions:  - Your physician recommends that you continue on your current medications as directed. Please refer to the Current Medication list given to you today.  *If you need a refill on your cardiac medications before your next appointment, please call your pharmacy*   Lab Work: - Your physician recommends that you have lab work today: BMP/ CBC  If you have labs (blood work) drawn today and your tests are completely normal, you will receive your results only by: [name] MyChart Message (if you have MyChart) OR . A paper copy in the mail If you have any lab test that is abnormal or we need to change your treatment, we will call you to review the results.   Testing/Procedures: - Your physician has requested that you have an echocardiogram- in 6 months. Echocardiography is a painless test that uses sound waves to create images of your heart. It provides your doctor with information about the size and shape of your heart and how well your heart's chambers and valves are working. This procedure takes approximately one hour. There are no restrictions for this procedure.   Follow-Up: At [facility], you and your health needs are our priority.  As part of our continuing mission to provide you with exceptional heart care, we have created designated Provider Care Teams.  These Care Teams include your primary Cardiologist (physician) and Advanced Practice Providers (APPs -  Physician Assistants and Nurse Practitioners) who all work together to provide you with the care you need, when you need it.  We recommend signing up for the patient portal called "MyChart".  Sign up information is provided on this After Visit Summary.  MyChart is used to connect with patients for Virtual Visits (Telemedicine).  Patients are able to view lab/test results, encounter notes, upcoming appointments, etc.  Non-urgent messages can be sent to your provider as well.   To learn more about what you can do with  MyChart, go to [email or web address].    Your next appointment:   6 month(s) (after the echocardiogram is completed)  The format for your next appointment:   In Person  Provider:    You may see Kathlyn Sacramento, MD or one of the following Advanced Practice Providers on your designated Care Team:    Murray Hodgkins, NP  Christell Faith, PA-C  Marrianne Mood, PA-C    Other Instructions n/a

## 2020-01-24 ENCOUNTER — Encounter: Payer: Medicare Other | Admitting: Family Medicine

## 2020-01-24 LAB — CBC WITH DIFFERENTIAL/PLATELET
Basophils Absolute: 0.1 10*3/uL (ref 0.0–0.2)
Basos: 1 %
EOS (ABSOLUTE): 0.2 10*3/uL (ref 0.0–0.4)
Eos: 2 %
Hematocrit: 40.8 % (ref 37.5–51.0)
Hemoglobin: 13.6 g/dL (ref 13.0–17.7)
Immature Grans (Abs): 0 10*3/uL (ref 0.0–0.1)
Immature Granulocytes: 0 %
Lymphocytes Absolute: 2 10*3/uL (ref 0.7–3.1)
Lymphs: 27 %
MCH: 33.3 pg — ABNORMAL HIGH (ref 26.6–33.0)
MCHC: 33.3 g/dL (ref 31.5–35.7)
MCV: 100 fL — ABNORMAL HIGH (ref 79–97)
Monocytes Absolute: 0.8 10*3/uL (ref 0.1–0.9)
Monocytes: 11 %
Neutrophils Absolute: 4.4 10*3/uL (ref 1.4–7.0)
Neutrophils: 59 %
Platelets: 278 10*3/uL (ref 150–450)
RBC: 4.08 x10E6/uL — ABNORMAL LOW (ref 4.14–5.80)
RDW: 11.9 % (ref 11.6–15.4)
WBC: 7.5 10*3/uL (ref 3.4–10.8)

## 2020-01-24 LAB — BASIC METABOLIC PANEL
BUN/Creatinine Ratio: 22 (ref 10–24)
BUN: 19 mg/dL (ref 8–27)
CO2: 27 mmol/L (ref 20–29)
Calcium: 9.7 mg/dL (ref 8.6–10.2)
Chloride: 101 mmol/L (ref 96–106)
Creatinine, Ser: 0.85 mg/dL (ref 0.76–1.27)
GFR calc Af Amer: 92 mL/min/{1.73_m2} (ref >59)
GFR calc non Af Amer: 79 mL/min/{1.73_m2} (ref >59)
Glucose: 100 mg/dL — ABNORMAL HIGH (ref 65–99)
Potassium: 4.4 mmol/L (ref 3.5–5.2)
Sodium: 142 mmol/L (ref 134–144)

## 2020-01-26 ENCOUNTER — Other Ambulatory Visit: Payer: Self-pay | Admitting: Family Medicine

## 2020-01-26 DIAGNOSIS — E7800 Pure hypercholesterolemia, unspecified: Secondary | ICD-10-CM

## 2020-01-28 ENCOUNTER — Encounter: Payer: Medicare Other | Admitting: Family Medicine

## 2020-02-05 NOTE — Telephone Encounter (Signed)
Discussed with patient during office visit 01/23/20.

## 2020-02-11 NOTE — Progress Notes (Signed)
Chief Complaint  Patient presents with  . Hypertension    nonfasting med check, no concerns. Daughter says he has spells every now and then, where he seems out of it. They are concerned about him falling when this happens. Would prefer to have flu vaccine at pharmacy with his wife and maybe have COVID booster as well. Did not get Tdap at pharmacy.     Atrial fibrillation and hypertension: He saw cardiologist a few weeks ago.  He had repeat echo (see below), which showed some worsening of aortic stenosis, remains asymptomatic, to have f/u echo in 6 months. Medications were not changed.  He remains on carvedilol 6.81m BID and eliquis.  Previously took lisinopril and amlodipine for hypertension, stopped related to orthostatic hypotension. He reports that his BP's have been running on average 140/80 (per recall, didn't bring list).  Sometimes the diastolic is in the 538'V(when systolic is also lower). Sometimes they are under 100--he can tell, but denies pre-syncope.  Only about once a month.  Once it was low when he was up most of the night with his wife who was sick. He has to work hard to drink water, doesn't like it. He denies headaches, syncope, chest pain, palpitations, edema. Occasional dizziness/swimmy-headed when BP is low. Reports has seen BP's in the 756'Esystolic, often when the family notes he is standing, not speaking or responsive.   Echo 12/2019: IMPRESSIONS  1. Left ventricular ejection fraction, by estimation, is 50 to 55%. The left ventricle has low normal function. Left ventricular endocardial border not optimally defined to evaluate regional wall motion. There is mild left ventricular hypertrophy. Left  ventricular diastolic parameters are consistent with Grade II diastolic dysfunction (pseudonormalization). Elevated left atrial pressure.  2. Right ventricular systolic function is normal. The right ventricular size is normal. There is severely elevated pulmonary artery systolic  pressure.  3. The mitral valve is normal in structure. Mild mitral valve regurgitation. No evidence of mitral stenosis.  4. Tricuspid valve regurgitation is mild to moderate.  5. The aortic valve is tricuspid. Aortic valve regurgitation is trivial. Moderate to severe aortic valve stenosis. Aortic valve area, by VTI measures 0.77 cm. Aortic valve mean gradient measures 23.0 mmHg. Aortic valve Vmax measures 2.97 m/s.  6. The inferior vena cava is dilated in size with <50% respiratory variability, suggesting right atrial pressure of 15 mmHg.   H/o Stroke (10/2014) and seizures--He last saw Dr. SLeonie Manin April.  He has been on Keppra for possible complex partial seizures (transient episodes of altered awareness with speech difficulties); wife had reported a possible breakthrough seizure at that visit. His Keppra dose was supposed to be increased, and he underwent an EEG in 10/2019 which was normal. Daughter reports he is still having these spells (see above)--always occurs with standing, gets a blank look, stops.  Last time the daughter checked BP and it was very low (733'Isystolic). He states he never increased the dose.  Changed the timing to bedtime, thought caused some SE (weakness).   He has f/u with Dr. SLeonie Man10/6.  He remains on Eliquis for anticoagulation related to afib for prevention of further strokes.  He denies any bleeding, bruising. He has persistent tremor, but reports it is nott bothersome, treatment not recommended.   OSA and Pulmonary hypertension:  He reports not using his CPAP, too "aggravating", related to his urinary frequency. He is up 5-6 times/night to void.  DM/IFG--He doesn't check his sugars. He goes to the eye doctor regularly,  for macular degeneration, getting injections ([name]). He denies any feet concerns, numbness/tinglingor sores."My toes feel like I have extra skin or wearing socks all the time". He doesn't feel this all the time.He has urinary  frequency x several years (related to prostate), no change; denies polydipsia. Lab Results  Component Value Date   HGBA1C 5.5 07/26/2019   He has been cooking less, eating more junk--peanut butter crackers, cheese, cookies, yogurt.  Microalbuminuria noted in 04/2017, in 05/2018 was normal.  Urine microalbumin/Cr ratio was up to 31on last check in 07/2019, when no longer taking lisinopril.  Hyperlipidemia: Compliant with medications (2m of lovastatin); denies side effects. Tries to follow a lowfat, low cholesterol diet, cut back on fried foods.Lipids were at goal on last check. Eats beef (3x/week), a lot of seafood. He cut back his alcohol--not every day, often just 1/day, sometimes 2.  Lab Results  Component Value Date   CHOL 156 07/26/2019   HDL 71 07/26/2019   LDLCALC 65 07/26/2019   TRIG 112 07/26/2019   CHOLHDL 2.2 07/26/2019    Prostate cancer: Monitored by Dr. DRosana Hoesevery 6 months.He underwent internal urethrotomy, cystourethroscopy and circumcision on 02/15/17. He reported not reallynoticing improvement since treatment of the stricture,continues with frequency and leakage.His last visit with Dr. DRosana Hoeswas in June, 2020 andPSA was <0.01. Repeat cystoscopy was recommended to look for recurrent stricture, given persistent/worsening urinary frequency, dribbling and nocturia of 4-5x/night.  He is hesitant to do this, since it didn't help at all last time.  He prefers less invasive option, though doesn't sound like he asked/addressed this, just didn't schedule the procedure. He was offered second opinion/referral to [facility] regarding his urinary issues He hasn't been seen by Dr. DRosana Hoessince 10/2018. His nocturia is worse, now 5-6 times/night.  PMH, PSH, SH reviewed  Outpatient Encounter Medications as of 02/12/2020  Medication Sig Note  . acetaminophen (TYLENOL ARTHRITIS PAIN) 650 MG CR tablet Take 1 tablet (650 mg total) by mouth every 8 (eight) hours as needed.   .  carvedilol (COREG) 6.25 MG tablet Take 1 tablet (6.25 mg total) by mouth 2 (two) times daily with a meal.   . ELIQUIS 5 MG TABS tablet TAKE ONE TABLET TWICE DAILY   . levETIRAcetam (KEPPRA XR) 500 MG 24 hr tablet Take 2 tablets (1,000 mg total) by mouth daily. 02/12/2020: Taking just 1 tablet in the evening  . lovastatin (MEVACOR) 40 MG tablet TAKE TWO TABLETS EACH NIGHT AT BEDTIME   . Multiple Vitamins-Minerals (PRESERVISION AREDS) CAPS Take 1 capsule by mouth 2 (two) times daily.    .Marland KitchenUNABLE TO FIND Bevacizumab(AVASTIN) chemo injection for the eye every 10 weeks   . betamethasone dipropionate (DIPROLENE) 0.05 % ointment Apply topically 2 (two) times daily. (Patient not taking: Reported on 02/12/2020)   . loratadine (CLARITIN) 10 MG tablet Take by mouth. (Patient not taking: Reported on 02/12/2020)    No facility-administered encounter medications on file as of 02/12/2020.   No Known Allergies  ROS: no fever, chills, URI symptoms, cough, chest pain, palpitations. No edema.  No nausea, vomiting, bowel changes, bleeding, rashes. Shortness of breath at baseline Urinary frequency per HPI.  Denies dysuria, hematuria Moods are good +weight loss--he thinks from cutting back on alcohol. Denies changes hair/skin/nail/bowels/temperature intolerance.    PHYSICAL EXAM:  BP (!) 148/80   Pulse 72   Ht _0  (1.778 m)   Wt 157 lb 6.4 oz (71.4 kg)   BMI 22.58 kg/m   Wt Readings from  Last 3 Encounters:  02/12/20 157 lb 6.4 oz (71.4 kg)  01/23/20 157 lb 9.6 oz (71.5 kg)  09/03/19 170 lb 6.4 oz (77.3 kg)    Well developed, pleasant male, in no distress, in good spirits. HEENT: PERRL, EOMI, conjunctiva and sclera are clear. Wearing mask. Neck: no lymphadenopathy or mass.Bruits bilaterally--consistent with radiation of heart murmur into his carotids Heart: Harsh SEM, heard throughout;loudest at RUSB.regular rate and rhythm, no ectopy Lungs: clear bilaterally Back: no CVA or spinal  tenderness Abdomen: soft, nontender, no organomegaly or mass.  Extremities: no edema, 2+ pulses. Neuro: alert and oriented. Normal gait, strength. Slight tremor noted in the L hand Psych: normal mood, affect, hygiene and grooming. Skin: dry skin on legs. Some bruising on arms.  Nolesionsorrash  Had b-met and CBC recently through cardiology. Fasting glucose 100   Chemistry      Component Value Date/Time   NA 142 01/23/2020 1151   NA 137 10/01/2013 1719   K 4.4 01/23/2020 1151   K 3.6 10/01/2013 1719   CL 101 01/23/2020 1151   CL 101 10/01/2013 1719   CO2 27 01/23/2020 1151   CO2 27 10/01/2013 1719   BUN 19 01/23/2020 1151   BUN 17 10/01/2013 1719   CREATININE 0.85 01/23/2020 1151   CREATININE 0.78 05/27/2017 1007      Component Value Date/Time   CALCIUM 9.7 01/23/2020 1151   CALCIUM 9.4 10/01/2013 1719   ALKPHOS 47 07/26/2019 1222   ALKPHOS 46 10/01/2013 1719   AST 20 07/26/2019 1222   AST 31 10/01/2013 1719   ALT 17 07/26/2019 1222   ALT 45 10/01/2013 1719   BILITOT 0.6 07/26/2019 1222   BILITOT 0.4 10/01/2013 1719     Lab Results  Component Value Date   WBC 7.5 01/23/2020   HGB 13.6 01/23/2020   HCT 40.8 01/23/2020   MCV 100 (H) 01/23/2020   PLT 278 01/23/2020   Lab Results  Component Value Date   HGBA1C 6.0 (A) 02/12/2020     ASSESSMENT/PLAN:  Essential hypertension, benign - controlled--has always had wide fluctuations.  Having some hypotension. Hydrate and monitor regularly  Paroxysmal atrial fibrillation (HCC) - rate controlled and anticoagulated  Aortic atherosclerosis (HCC) - anticoagulated, and LDL at goal on statin  Pulmonary hypertension ([place])  Obstructive sleep apnea - noncompliant with CPAP use. Risks of untreated OSA reviewed, encouraged him to restart, other options reviewed  Personal history of prostate cancer - past due for f/u--to schedule with [name] vs establish with new urologist in [place], also to address his issues with  nocturia  Pre-diabetes - A1c higher than at last visit, diet has changed. Reviewed healthy diet, exercise. Cont weight loss - Plan: HgB A1c  Aortic valve stenosis, etiology of cardiac valve disease unspecified - reviewed s/sx severe AS. f/u 6 mos for repeat echo as planned  Seizure disorder ([place]) - pt states he never increased Keppra; has f/u with neuro. Some spells sound orthostatic/hypotensive related. EEG was normal  Microalbuminuria - slightly elevated in 07/2019, when off lisinopril (stopped due to low BP). Recheck 6 mos  Long term current use of anticoagulant - recent CBC stable  Vaccine counseling - counseled re: rec for Shingrix, COVID booster, flu shot.  He is past due for tetanus booster and reminded to get from pharmacy. Discussed timing.  Pt states he will get COVID booster and flu shot together, with wife at pharmacy later this month.  Declined TSH (in setting of weight loss, atrial fib; had  no other symptoms, so can wait until next visit).  FTF visit time >45 minutes, plus additional time spent in chart review and documentation.    I suspect that the blank spells with standing may be more related to low blood pressure rather than seizures.  Try and check the blood pressure more regularly and record.  If you are seeing frequent blood pressures under 100 (especially if they are in the 70's like they were), please contact Dr. Tyrell Antonio office as your medication may need to be adjusted.  Treating sleep apnea is important for your heart. Please try and wear the CPAP at least 4 hours/night. If you're struggling, we can consider repeating a sleep study at some point, to see if your settings are still correct, or if the sleep apnea has improved with your weight loss.  You should discuss this with [name].  Try and drink your fluids early during the day, getting at least 6-8 glasses of non-caffeinated, non-alcoholic beverage, and limit fluids after 6-7pm.  It is important for you to  eat healthier--eating more vegetables, protein, and cutting back on the junk in your diet (cookies, ice cream), eating more meals, rather than only just snacking. Your sugars are higher than on last check.  And if they get very high, make your urine issue worse.  You are past due for follow up on your prostate cancer. You either need to see [name], or establish with a new urologist (at [facility] or in [place]).  Let me know if you need a referral.  You can discuss your symptoms and treatment options--opting for medications rather than procedures initially.  Get the flu and COVID booster along with your wife, later this month.  You are past due for your TdaP, please get this from the pharmacy, 2-4 weeks after the other vaccines. I recommend getting the new shingles vaccine (Shingrix). You will need to also get this from the pharmacy--you can hold off for a bit on this, and get your other vaccines first (or get the first shot at the same time as the TdaP).  It is a series of 2 injections, spaced 2 months apart.

## 2020-02-12 ENCOUNTER — Other Ambulatory Visit: Payer: Self-pay

## 2020-02-12 ENCOUNTER — Ambulatory Visit (INDEPENDENT_AMBULATORY_CARE_PROVIDER_SITE_OTHER): Payer: Medicare Other | Admitting: Family Medicine

## 2020-02-12 ENCOUNTER — Encounter: Payer: Self-pay | Admitting: Family Medicine

## 2020-02-12 VITALS — BP 148/80 | HR 72 | Ht 70.0 in | Wt 157.4 lb

## 2020-02-12 DIAGNOSIS — I272 Pulmonary hypertension, unspecified: Secondary | ICD-10-CM

## 2020-02-12 DIAGNOSIS — Z8546 Personal history of malignant neoplasm of prostate: Secondary | ICD-10-CM | POA: Diagnosis not present

## 2020-02-12 DIAGNOSIS — I480 Paroxysmal atrial fibrillation: Secondary | ICD-10-CM | POA: Diagnosis not present

## 2020-02-12 DIAGNOSIS — I700 Atherosclerosis of aorta: Secondary | ICD-10-CM | POA: Diagnosis not present

## 2020-02-12 DIAGNOSIS — Z7189 Other specified counseling: Secondary | ICD-10-CM | POA: Diagnosis not present

## 2020-02-12 DIAGNOSIS — R809 Proteinuria, unspecified: Secondary | ICD-10-CM

## 2020-02-12 DIAGNOSIS — Z7901 Long term (current) use of anticoagulants: Secondary | ICD-10-CM | POA: Diagnosis not present

## 2020-02-12 DIAGNOSIS — I350 Nonrheumatic aortic (valve) stenosis: Secondary | ICD-10-CM

## 2020-02-12 DIAGNOSIS — G4733 Obstructive sleep apnea (adult) (pediatric): Secondary | ICD-10-CM | POA: Diagnosis not present

## 2020-02-12 DIAGNOSIS — I10 Essential (primary) hypertension: Secondary | ICD-10-CM

## 2020-02-12 DIAGNOSIS — R7303 Prediabetes: Secondary | ICD-10-CM

## 2020-02-12 DIAGNOSIS — Z7185 Encounter for immunization safety counseling: Secondary | ICD-10-CM

## 2020-02-12 DIAGNOSIS — G40909 Epilepsy, unspecified, not intractable, without status epilepticus: Secondary | ICD-10-CM

## 2020-02-12 LAB — POCT GLYCOSYLATED HEMOGLOBIN (HGB A1C): Hemoglobin A1C: 6 % — AB (ref 4.0–5.6)

## 2020-02-12 NOTE — Patient Instructions (Signed)
  I suspect that the blank spells with standing may be more related to low blood pressure rather than seizures.  Try and check the blood pressure more regularly and record.  If you are seeing frequent blood pressures under 100 (especially if they are in the 70's like they were), please contact Dr. Tyrell Antonio office as your medication may need to be adjusted.  Treating sleep apnea is important for your heart. Please try and wear the CPAP at least 4 hours/night. If you're struggling, we can consider repeating a sleep study at some point, to see if your settings are still correct, or if the sleep apnea has improved with your weight loss.  You should discuss this with [name].  Try and drink your fluids early during the day, getting at least 6-8 glasses of non-caffeinated, non-alcoholic beverage, and limit fluids after 6-7pm.  It is important for you to eat healthier--eating more vegetables, protein, and cutting back on the junk in your diet (cookies, ice cream), eating more meals, rather than only just snacking. Your sugars are higher than on last check.  And if they get very high, make your urine issue worse.  You are past due for follow up on your prostate cancer. You either need to see [name], or establish with a new urologist (at [facility] or in [place]).  Let me know if you need a referral.  You can discuss your symptoms and treatment options--opting for medications rather than procedures initially.  Get the flu and COVID booster along with your wife, later this month.  You are past due for your TdaP, please get this from the pharmacy, 2-4 weeks after the other vaccines. I recommend getting the new shingles vaccine (Shingrix). You will need to also get this from the pharmacy--you can hold off for a bit on this, and get your other vaccines first (or get the first shot at the same time as the TdaP).  It is a series of 2 injections, spaced 2 months apart.

## 2020-02-14 DIAGNOSIS — H353231 Exudative age-related macular degeneration, bilateral, with active choroidal neovascularization: Secondary | ICD-10-CM | POA: Diagnosis not present

## 2020-03-05 ENCOUNTER — Ambulatory Visit (INDEPENDENT_AMBULATORY_CARE_PROVIDER_SITE_OTHER): Payer: Medicare Other | Admitting: Neurology

## 2020-03-05 ENCOUNTER — Encounter: Payer: Self-pay | Admitting: Neurology

## 2020-03-05 VITALS — BP 201/98 | HR 85 | Ht 71.0 in | Wt 157.8 lb

## 2020-03-05 DIAGNOSIS — R42 Dizziness and giddiness: Secondary | ICD-10-CM | POA: Diagnosis not present

## 2020-03-05 DIAGNOSIS — I6522 Occlusion and stenosis of left carotid artery: Secondary | ICD-10-CM

## 2020-03-05 DIAGNOSIS — R251 Tremor, unspecified: Secondary | ICD-10-CM | POA: Diagnosis not present

## 2020-03-05 NOTE — Patient Instructions (Addendum)
I had a long discussion with the patient and his wife regarding his intermittent episodes of dizziness and altered awareness being of unclear significance.  We discussed results of last EEG which was also normal.  Recommend since episodes are infrequent and do not believe further evaluation or treatment is necessary at this point.  Continue Keppra XR 1000 mg daily.  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  Will return for follow-up in the future in 6 months or call earlier if necessary.

## 2020-03-05 NOTE — Progress Notes (Signed)
[facility] [address] [place]. [place] [address] [phone]       OFFICE FOLLOW UP NOTE  Jeffrey. Jeffrey [name] Date of Birth:  1933-05-04 Medical Record Number:  [id number]   Referring MD:   Rudene Re Reason for visit: Seizures [facility] provider: [name]   Chief Complaint  Patient presents with  . Follow-up    no complaints  . Room 2    with wife in room     HPI:   Initial visit 05/02/2018 PS: Jeffrey [name] is a pleasant 86 year old Caucasian male was seen today for initial office consultation visit.  He is accompanied by his wife and daughter.  History is obtained from them and review of referral notes.  I have reviewed imaging films in PACS.  He has been having multiple brief episodes of altered awareness for several years but these appear to have increased in the last 6 months and frequency as well as duration.  He had a prolonged episode recently in November 2019 when he went to the ER.  CT scan of the head was obtained which I reviewed showed no acute abnormality except changes of age-related small vessel disease.  He is recently had a few back-to-back episodes as well.  He is unable to identify specific trigger for his episodes.  He is usually staring during these episodes and does not respond to commands.  He does not lose consciousness fall or hurt himself.  His episodes were previously lasting less than a minute the dose of few recent ones have been more prolonged.  Following some of these episodes he tends to get agitated and disoriented.  The patient's daughter feels she is noticed that he does some automatic hand movements and some of these episodes.  He denies any headache or any aura prior to these episodes.  He has no remote history of head injury with loss of consciousness intracranial hemorrhage or strokes.  There is no family history of epilepsy or seizures.  There is no specific pattern for these episodes which may occur once every few months to several  in a week.  He has not had an EEG or MRI scan done or a trial of seizure medications. He had prior history of right anterior frontal lacunar infarct in 11/27/2014 he was felt to have asymptomatic 80 to 90% left ICA stenosis at that time.  Echocardiogram was normal.  He has remote history of [facility] spotted fever with encephalitis.  He was treated with a 2-week course of doxycycline.  He did not have any definite seizures at that time.  He had episode of brief staring with a blank look on his face in May 2016 and was admitted to [facility] where he had an EEG which was negative for seizures.  He was seen by Dr. Theador Hawthorne neurologist as an outpatient and had a negative work-up for his seizures as well.  He was referred to Dr. Juanda Crumble feels vascular surgeon for left carotid stenosis follow-up with ultrasound in the office showed stenosis to be much less and hence conservative follow-up was recommended.  He was seen by me in the office in March 2017 but has not followed up since then.  He was found to subsequently have a transient episode of paroxysmal A. fib and was started on Eliquis by cardiologist [name].  He also had a second EEG done in September 2016 which was also unremarkable except for mild generalized slowing. Update 08/07/2018 PS : He returns  for follow-up after last visit 3 months ago.  He is accompanied by his wife.  He states he is doing well.  Is tolerating Keppra XR 500 mg quite well without any side effects.  He has had no spells like seizures or speech difficulties since the last visit.  He has had no stroke or TIA symptoms either.  He underwent EEG on 06/12/2018 which was read by me and was normal.  MRI scan of the brain done on 06/14/2018 which I have personally reviewed the images shows tiny right posterior temporal white matter lacunar infarct which was clinically silent as patient had no symptoms at that time.  Carotid ultrasound previously done on 12/23/2017 had shown  40 to 59% left ICA stenosis no significant stenosis on the right.  Transthoracic echo done on 04/05/2018 and showed normal ejection fraction.  Lipid profile done on 07/05/2018 was unremarkable with LDL cholesterol of 66 mg percent, HDL of 59 and total cholesterol 152 mg percent.  Hemoglobin A1c on 06/28/2018 was 6.0.  Patient has been diagnosed with sleep apnea by his pulmonologist Dr Leonidas Romberg and has started using CPAP every night recently.  He has no new complaints today.  He states his tremors are unchanged and are not functionally disabling.  He does follow-up with [name] from vascular surgery for carotid stenosis and will have follow-up carotid ultrasound done in upcoming visit in the spring  Update 02/20/2019: Jeffrey [name] is a 86 year old male who is being seen today for follow-up accompanied by wife.  He has been doing well from a neurological standpoint.  He continues on Keppra XR 500 mg daily without recurrent seizure episodes or speech difficulties.  He did have a ED visit on 12/29/2018 for syncopal episode which was felt likely due to orthostatic hypotension with cardiology follow-up and medications adjusted.  Syncopal episode was not felt to be related to seizures.  Denies new or recurring stroke/TIA symptoms as well.  Upper extremity L>R tremors have been stable and do not interfere with daily functioning.  He remains on Eliquis without bleeding or bruising.  He endorses ongoing compliance with CPAP for OSA management.  Blood pressure today elevated at 189/90 but does endorse elevation at doctor's appointments.  He does monitor at home and typically 120s/70s.  Denies new or worsening stroke/TIA symptoms.  Update 09/03/2019 : He returns for follow-up after last visit 6 months ago.  He states he is doing well but wife states that a few weeks ago she noticed episode of transient unresponsiveness where he was staring ahead and not immediately responsive this lasted only a few moments and then returned back to  baseline but patient did not remember that.  There were no automatic movements or lipsmacking noted.  There is no confusion or disorientation or headache.  There is no apparent trigger.  He remains on Keppra Exar 500 mg daily and is tolerating well without side effects.  He remains on Eliquis for his A. fib and has not had any stroke or TIA symptoms.  His blood pressure is significantly elevated today with the patient's wife claims that this on whitecoat hypertension as it is much better at home.  He continues to have intermittent hand tremor but this is not functionally disabling.  He has no other new complaints. Update 03/05/2020 : He returns for follow-up after last visit 6 months ago.  Is accompanied by his wife.  Wife feels that these episodes of transient dizziness and unresponsiveness and staring ahead is still occurring but  she is not sure how frequently is happening.  She gets this is probably once a month or so.  He has tolerated increasing the dose of Keppra XR 2000 mg daily without side effects.  He continues to have mild tremors in his hands and head but these are not interfering with day-to-day activities and remain unchanged.  Is tolerating Eliquis well without bruising or bleeding.  He had a repeat EEG done on 11/15/2019 which was normal.  He has no new complaints.  ROS:   14 system review of systems is positive for tremors, dizziness, staring and unresponsive episode denies and all other systems negative  PMH:  Past Medical History:  Diagnosis Date  . Acute CVA (cerebrovascular accident) ([place])   . Acute encephalopathy 11/27/2014  . Aortic atherosclerosis ([place]) 11/25/2014  . Atrial fibrillation ([place])    a. 12/2018 Zio: Sinus rhythm, avg rate 71. 2 runs of VT (max 17 beats). 27 runs SVT (max 14 beats). Occas PACs/PVCs. No high grade AV block/pauses.  . Chronic combined systolic (congestive) and diastolic (congestive) heart failure ([place])    a. 10/2014 Echo: EF 40-45%; b. 03/2018 Echo: EF  55-60%; c. 12/2018 Echo: EF 55-60%, mild conc LVH. Nl RV fxn. RVSP 51.37mmHg. Mildly dil LA. Mod AS w/ severe Ca2+; d. 12/2019 Echo: EF 50-55%, mild LVH, gr2 DD. Sev elev PASP. Mild Jeffrey, mild to mod TR. Triv AI, mod to sev AS (area 0.77cm^2, mean grad 46mmHg. Vmax 2.28m/s).  . Diabetes mellitus   . Hypercholesteremia   . Hypertension   . Impaired fasting glucose   . Macular degeneration   . Moderate to severe aortic stenosis    a. 12/2018 Echo: EF 55-60%, Mod AS w/ sev AoV Ca2+; b. 12/2019 Echo: EF 50-55%. Mod to sev AS (area 0.77cm^2, mean grad 28mmHg. Vmax 2.61m/s).  . Multiple lacunar infarcts ([place])   . Obstructive sleep apnea 06/27/2018  . Prostate cancer ([place]) radiation + seeding implant (Dr.[name])  . 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 Hilarie Fredrickson)  . [facility] spotted fever) 10/2014   (hosp with FUO, encephalopathy)  . Rotator cuff tear, right 10/2010   supraspinatous and infraspinatous  . Seizure disorder ([place]) 07/25/2019  . TIA (transient ischemic attack) 10/02/13   hosp at [facility] x 1 night  . 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 Marital status: Married    Spouse name: [name]  . Number of children: 4  . Years of education: Not on file  . Highest education level: Not on file  Occupational History  . Occupation: Retired  Tobacco Use  . Smoking status: Former Smoker    Packs/day: 1.00    Years: 20.00    Pack years: 20.00    Types: Cigarettes    Quit date: 05/31/1980    Years since quitting: 39.7  . Smokeless tobacco: Never Used  Vaping Use  . Vaping Use: Never used  Substance and Sexual Activity  . Alcohol use: Yes    Alcohol/week: 14.0 standard drinks    Types: 14 Cans of beer per week    Comment: 2 cans of beer most days of the week  . Drug use: No  . Sexual activity: Not Currently    Partners: Female    Comment:  issues with ED  Other Topics Concern  . Not on file  Social History Narrative   Married. Retired  Engineer, structural.  2 sons, 2 daughters (all in [place]), 15 grandchildren, 7 great grandchild, 2 more expected   Lives with wife   Right Handed   Drinks 2 cups of caffeine daily   Social Determinants of Health   Financial Resource Strain:   . Difficulty of Paying Living Expenses: Not on file  Food Insecurity:   . Worried About Charity fundraiser in the Last Year: Not on file  . Ran Out of Food in the Last Year: Not on file  Transportation Needs:   . Lack of Transportation (Medical): Not on file  . Lack of Transportation (Non-Medical): Not on file  Physical Activity:   . Days of Exercise per Week: Not on file  . Minutes of Exercise per Session: Not on file  Stress:   . Feeling of Stress : Not on file  Social Connections:   . Frequency of Communication with Friends and Family: Not on file  . Frequency of Social Gatherings with Friends and Family: Not on file  . Attends Religious Services: Not on file  . Active Member of Clubs or Organizations: Not on file  . Attends Archivist Meetings: Not on file  . Marital Status: Not on file  Intimate Partner Violence:   . Fear of Current or Ex-Partner: Not on file  . Emotionally Abused: Not on file  . Physically Abused: Not on file  . Sexually Abused: Not on file    Medications:   Current Outpatient Medications on File Prior to Visit  Medication Sig Dispense Refill  . acetaminophen (TYLENOL ARTHRITIS PAIN) 650 MG CR tablet Take 1 tablet (650 mg total) by mouth every 8 (eight) hours as needed.    . betamethasone dipropionate (DIPROLENE) 0.05 % ointment Apply topically 2 (two) times daily.     . carvedilol (COREG) 6.25 MG tablet Take 1 tablet (6.25 mg total) by mouth 2 (two) times daily with a meal. 180 tablet 0  . ELIQUIS 5 MG TABS tablet TAKE ONE TABLET TWICE DAILY 180 tablet 1  . levETIRAcetam (KEPPRA XR) 500 MG 24 hr tablet Take 2  tablets (1,000 mg total) by mouth daily. 90 tablet 3  . loratadine (CLARITIN) 10 MG tablet Take by mouth.     . lovastatin (MEVACOR) 40 MG tablet TAKE TWO TABLETS EACH NIGHT AT BEDTIME 180 tablet 0  . Multiple Vitamins-Minerals (PRESERVISION AREDS) CAPS Take 1 capsule by mouth 2 (two) times daily.     [name] UNABLE TO FIND Bevacizumab(AVASTIN) chemo injection for the eye every 10 weeks     No current facility-administered medications on file prior to visit.    Allergies:  No Known Allergies  Physical Exam  Today's Vitals   03/05/20 1249  BP: (!) 201/98  Pulse: 85  Weight: 71.6 kg  Height: 5\' 11"  (1.803 m)   Body mass index is 22.01 kg/m.   General: Pleasant frail elderly Caucasian male seated, in no evident distress Head: head normocephalic and atraumatic.   Neck: supple with soft left  carotid   bruit Cardiovascular: regular rate and rhythm, no murmurs Musculoskeletal: no deformity Skin:  no rash/petichiae Vascular:  Normal pulses all extremities  Neurologic Exam Mental Status: Awake and fully alert. Oriented to place and time. Recent and remote memory intact. Attention span, concentration and fund of knowledge appropriate. Mood and affect appropriate.   Cranial Nerves: Pupils equal, briskly reactive to light. Extraocular movements full without nystagmus. Visual fields full to confrontation. Hearing significantly diminished bilaterally. Facial sensation intact.  Face, tongue, palate moves normally and symmetrically.  Motor: Normal bulk and tone. Normal strength in all tested extremity muscles. Mild to moderate action tremor of outstretched upper extremities left greater than right.  Intermittent head tremor.  Mild Cogwheel rigidity left greater than right wrist upon activation mainly.  No bradykinesia.   Sensory.: intact to touch , pinprick , position and vibratory sensation.  Coordination: Rapid alternating movements normal in all extremities. Finger-to-nose and heel-to-shin  performed accurately bilaterally. Gait and Station: Arises from chair without difficulty. Stance is normal. Gait demonstrates normal stride length and balance but slight diminished left arm swing. No festination no stooped posture.. Able to heel, toe and tandem walk with moderate difficulty.  Positive retropulsion to threat and will fall if not caught. Reflexes: 1+ and symmetric. Toes downgoing.      ASSESSMENT: 87 year long-standing recurrent transient stereotypical episodes of altered awareness with speech difficulties possibly complex partial seizures.  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  We discussed results of last EEG which was also normal.  Recommend since episodes are infrequent and do not believe further evaluation or treatment is necessary at this point.  Continue Keppra XR 1000 mg daily.  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  Will return for follow-up in the future in 6 months or call earlier if necessary. Greater than 50% time during this 25-minute  visit was spent on counseling and coordination of care and discussion about complex partial seizures and parkinsonian vs essential tremor and answering questions   Antony Contras, MD [facility] [address] [place],  [address]  Phone [phone] Fax [phone] Note: This document was prepared with digital dictation and possible smart phrase technology. Any transcriptional  errors that result from this process are unintentional.

## 2020-03-07 ENCOUNTER — Other Ambulatory Visit: Payer: Self-pay | Admitting: Internal Medicine

## 2020-03-07 ENCOUNTER — Ambulatory Visit: Payer: Medicare Other

## 2020-03-10 NOTE — Progress Notes (Signed)
° °  Covid-19 Vaccination Clinic  Name:  STEVIE CHARTER    MRN: [id number] DOB: December 24, 1932  03/10/2020  Mr. Doublin was observed post Covid-19 immunization for 15 minutes without incident. He was provided with Vaccine Information Sheet and instruction to access the V-Safe system.   Mr. Hoehn was instructed to call 911 with any severe reactions post vaccine:  Difficulty breathing   Swelling of face and throat   A fast heartbeat   A bad rash all over body   Dizziness and weakness

## 2020-03-13 DIAGNOSIS — H353231 Exudative age-related macular degeneration, bilateral, with active choroidal neovascularization: Secondary | ICD-10-CM | POA: Diagnosis not present

## 2020-04-16 DIAGNOSIS — Z23 Encounter for immunization: Secondary | ICD-10-CM | POA: Diagnosis not present

## 2020-04-17 DIAGNOSIS — H353231 Exudative age-related macular degeneration, bilateral, with active choroidal neovascularization: Secondary | ICD-10-CM | POA: Diagnosis not present

## 2020-04-19 ENCOUNTER — Other Ambulatory Visit: Payer: Self-pay | Admitting: Physician Assistant

## 2020-04-19 ENCOUNTER — Other Ambulatory Visit: Payer: Self-pay | Admitting: Cardiovascular Disease

## 2020-04-21 NOTE — Telephone Encounter (Signed)
Refill request

## 2020-04-21 NOTE — Telephone Encounter (Signed)
Pt's age 86, wt 71.6 kg, SCr 0.85, CrCl 64.35, last ov w/ CB 01/23/20.

## 2020-06-02 ENCOUNTER — Other Ambulatory Visit: Payer: Self-pay | Admitting: Family Medicine

## 2020-06-02 DIAGNOSIS — E7800 Pure hypercholesterolemia, unspecified: Secondary | ICD-10-CM

## 2020-06-05 DIAGNOSIS — H353231 Exudative age-related macular degeneration, bilateral, with active choroidal neovascularization: Secondary | ICD-10-CM | POA: Diagnosis not present

## 2020-07-07 ENCOUNTER — Ambulatory Visit (INDEPENDENT_AMBULATORY_CARE_PROVIDER_SITE_OTHER): Payer: Medicare Other | Admitting: Podiatry

## 2020-07-07 ENCOUNTER — Encounter: Payer: Self-pay | Admitting: Podiatry

## 2020-07-07 ENCOUNTER — Other Ambulatory Visit: Payer: Self-pay

## 2020-07-07 DIAGNOSIS — M79675 Pain in left toe(s): Secondary | ICD-10-CM | POA: Diagnosis not present

## 2020-07-07 DIAGNOSIS — M79674 Pain in right toe(s): Secondary | ICD-10-CM

## 2020-07-07 DIAGNOSIS — T148XXA Other injury of unspecified body region, initial encounter: Secondary | ICD-10-CM

## 2020-07-07 DIAGNOSIS — B351 Tinea unguium: Secondary | ICD-10-CM | POA: Diagnosis not present

## 2020-07-07 NOTE — Progress Notes (Signed)
This patient returns to my office for at risk foot care.  This patient requires this care by a professional since this patient will be at risk due to having type 2 diabetes and coagulation defect.   He says his medical doctor told him to be sen.  He says he has developed a blackened big toenail left foot.  No history of trauma or injury   This patient is unable to cut nails himself since the patient cannot reach his nails.These nails are painful walking and wearing shoes.  This patient presents for at risk foot care today.  General Appearance  Alert, conversant and in no acute stress.  Vascular  Dorsalis pedis and posterior tibial  pulses are palpable  bilaterally.  Capillary return is within normal limits  bilaterally. Temperature is within normal limits  bilaterally.  Neurologic  Senn-Weinstein monofilament wire test within normal limits  bilaterally. Muscle power within normal limits bilaterally.  Nails Thick disfigured discolored nails with subungual debris  from hallux to fifth toes bilaterally. No evidence of bacterial infection or drainage bilaterally.  Subungual hematoma which is dried with no fluctuation.  Orthopedic  No limitations of motion  feet .  No crepitus or effusions noted.  No bony pathology or digital deformities noted.  Skin  normotropic skin with no porokeratosis noted bilaterally.  No signs of infections or ulcers noted.     Onychomycosis  Pain in right toes  Pain in left toes  Subungual hematoma left hallux.  Consent was obtained for treatment procedures.   Mechanical debridement of nails 1-5  bilaterally performed with a nail nipper.  Filed with dremel without incident.  The left hallux nail is weakly attached to nail bed due to the hematoma.  We discussed this condition and he decided to let the nail define  Itself.     Return office visit   3 months.                  Told patient to return for periodic foot care and evaluation due to potential at risk  complications.   Gardiner Barefoot DPM

## 2020-07-25 ENCOUNTER — Other Ambulatory Visit: Payer: Medicare Other

## 2020-07-28 ENCOUNTER — Other Ambulatory Visit: Payer: Self-pay | Admitting: Physician Assistant

## 2020-07-31 ENCOUNTER — Ambulatory Visit: Payer: Medicare Other | Admitting: Family Medicine

## 2020-07-31 DIAGNOSIS — H353231 Exudative age-related macular degeneration, bilateral, with active choroidal neovascularization: Secondary | ICD-10-CM | POA: Diagnosis not present

## 2020-08-08 ENCOUNTER — Other Ambulatory Visit: Payer: Self-pay

## 2020-08-08 ENCOUNTER — Ambulatory Visit (INDEPENDENT_AMBULATORY_CARE_PROVIDER_SITE_OTHER): Payer: Medicare Other

## 2020-08-08 DIAGNOSIS — I350 Nonrheumatic aortic (valve) stenosis: Secondary | ICD-10-CM

## 2020-08-08 DIAGNOSIS — I480 Paroxysmal atrial fibrillation: Secondary | ICD-10-CM

## 2020-08-08 DIAGNOSIS — I10 Essential (primary) hypertension: Secondary | ICD-10-CM | POA: Diagnosis not present

## 2020-08-08 DIAGNOSIS — Z79899 Other long term (current) drug therapy: Secondary | ICD-10-CM | POA: Diagnosis not present

## 2020-08-08 LAB — ECHOCARDIOGRAM COMPLETE
AR max vel: 0.84 cm2
AV Area VTI: 0.91 cm2
AV Area mean vel: 0.84 cm2
AV Mean grad: 20.5 mmHg
AV Peak grad: 38.3 mmHg
Ao pk vel: 3.1 m/s
Area-P 1/2: 4.54 cm2
Calc EF: 51.2 %
S' Lateral: 3.2 cm
Single Plane A2C EF: 48.4 %
Single Plane A4C EF: 53 %

## 2020-08-12 ENCOUNTER — Ambulatory Visit (INDEPENDENT_AMBULATORY_CARE_PROVIDER_SITE_OTHER): Payer: Medicare Other | Admitting: Cardiovascular Disease

## 2020-08-12 ENCOUNTER — Other Ambulatory Visit: Payer: Self-pay

## 2020-08-12 ENCOUNTER — Encounter: Payer: Self-pay | Admitting: Cardiovascular Disease

## 2020-08-12 VITALS — BP 180/100 | HR 77 | Ht 71.0 in | Wt 155.0 lb

## 2020-08-12 DIAGNOSIS — I350 Nonrheumatic aortic (valve) stenosis: Secondary | ICD-10-CM | POA: Diagnosis not present

## 2020-08-12 DIAGNOSIS — Z9989 Dependence on other enabling machines and devices: Secondary | ICD-10-CM | POA: Diagnosis not present

## 2020-08-12 DIAGNOSIS — G4733 Obstructive sleep apnea (adult) (pediatric): Secondary | ICD-10-CM

## 2020-08-12 DIAGNOSIS — I480 Paroxysmal atrial fibrillation: Secondary | ICD-10-CM

## 2020-08-12 DIAGNOSIS — I10 Essential (primary) hypertension: Secondary | ICD-10-CM | POA: Diagnosis not present

## 2020-08-12 MED ORDER — AMLODIPINE BESYLATE 2.5 MG PO TABS: 2.5000 mg | ORAL_TABLET | Freq: Every day | ORAL | 3 refills | Status: DC

## 2020-08-12 NOTE — Progress Notes (Signed)
Cardiology Office Note   Date:  08/12/2020   ID:  Jeffrey [name], Jeffrey [name] December 15, 1932, MRN [id number]  PCP:  Rita Ohara, MD  Cardiologist:   Jeffrey Sacramento, MD   Chief Complaint  Patient presents with  . Other    6 month follow up - Meds reviewed verbally with patient.       History of Present Illness: Jeffrey [name] is a 87 y.o. male who presents for a follow-up visit regarding paroxysmal atrial fibrillation and aortic stenosis. Other medical problems include hyperlipidemia, TIA,  moderate left carotid stenosis, sleep apnea and borderline diabetes mellitus. He had previous stroke in June 2016 and later was found to have [facility] Fever which was treated with doxycycline.   Nuclear stress test in August of 2016 showed no evidence of ischemia with normal ejection fraction. Echocardiogram in November 2019 showed normal LV systolic function, moderate aortic stenosis with a mean gradient of 21 mmHg and valve area of 0.95.  Pulmonary pressure was moderately elevated at 56 mmHg.  He has known history of labile hypertension with previous elevated blood pressure which required switching metoprolol to carvedilol and adding amlodipine.  He has a component of whitecoat syndrome.  He had an emergency room visit in July, 2020 with a possible syncopal episode with low blood pressure.  He had facial laceration and was instructed to hold Eliquis for 2 days.  He had significant orthostatic hypotension that required decreasing some of his antihypertensive medications. 14-day ZIO patch monitor showed sinus rhythm with an average heart rate of 71 bpm.  There were 2 runs of ventricular tachycardia the longest lasted 17 beats.  In addition, 27 runs of SVT were noted the longest lasted 14 beats.  Given frequent ectopy, it was decided to discontinue lisinopril and amlodipine and uptitrate carvedilol back to 6.25 mg twice daily.    He was most recently seen in our office in August and he was noted to  be hypertensive but it was decided to continue with same medications given that his home blood pressure readings were reasonable and given his known orthostatic hypotension.  He had a recent echocardiogram done which showed normal LV systolic function, grade 2 diastolic dysfunction, moderate pulmonary hypertension with peak systolic pulmonary pressure of 50 mmHg, mild to moderate mitral regurgitation and moderate to severe aortic stenosis with mean gradient of 20.5 mmHg and valve area of 0.91.  He denies chest pain.  He does report gradual decline in functional capacity with increased fatigue over the last year.  He has increased tremors.  He does not drive due to poor vision.  He reports only mild dizziness.  Past Medical History:  Diagnosis Date  . Acute CVA (cerebrovascular accident) ([place])   . Acute encephalopathy 11/27/2014  . Aortic atherosclerosis ([place]) 11/25/2014  . Atrial fibrillation ([place])    a. 12/2018 Zio: Sinus rhythm, avg rate 71. 2 runs of VT (max 17 beats). 27 runs SVT (max 14 beats). Occas PACs/PVCs. No high grade AV block/pauses.  . Chronic combined systolic (congestive) and diastolic (congestive) heart failure ([place])    a. 10/2014 Echo: EF 40-45%; b. 03/2018 Echo: EF 55-60%; c. 12/2018 Echo: EF 55-60%, mild conc LVH. Nl RV fxn. RVSP 51.26mmHg. Mildly dil LA. Mod AS w/ severe Ca2+; d. 12/2019 Echo: EF 50-55%, mild LVH, gr2 DD. Sev elev PASP. Mild MR, mild to mod TR. Triv AI, mod to sev AS (area 0.77cm^2, mean grad 68mmHg. Vmax 2.41m/s).  . Diabetes mellitus   .  Hypercholesteremia   . Hypertension   . Impaired fasting glucose   . Macular degeneration   . Moderate to severe aortic stenosis    a. 12/2018 Echo: EF 55-60%, Mod AS w/ sev AoV Ca2+; b. 12/2019 Echo: EF 50-55%. Mod to sev AS (area 0.77cm^2, mean grad 58mmHg. Vmax 2.59m/s).  . Multiple lacunar infarcts ([place])   . Obstructive sleep apnea 06/27/2018  . Prostate cancer ([place]) radiation + seeding implant (Dr.[name])  . Radiation  proctitis 11/2012   treated with APC ablation and Canasa suppositories (Dr. Hilarie Fredrickson)  . [facility] spotted fever) 10/2014   (hosp with FUO, encephalopathy)  . Rotator cuff tear, right 10/2010   supraspinatous and infraspinatous  . Seizure disorder ([place]) 07/25/2019  . TIA (transient ischemic attack) 10/02/13   hosp at [facility] x 1 night  . 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 CATARACT EXTRACTION Right 01/2017  . CIRCUMCISION  01/2017   [name]  . COLONOSCOPY N/A 11/28/2012   Procedure: COLONOSCOPY;  Surgeon: Jeffrey Bears, MD;  Location: [facility] ENDOSCOPY;  Service: Gastroenterology;  Laterality: N/A;  . INTERNAL URETHROTOMY  01/27/2016   WF  . prostate seed implant    . ROTATOR CUFF REPAIR  06/16/2011   right (Dr. Berenice [name])  . TONSILLECTOMY AND ADENOIDECTOMY  age 63     Current Outpatient Medications  Medication Sig Dispense Refill  . acetaminophen (TYLENOL ARTHRITIS PAIN) 650 MG CR tablet Take 1 tablet (650 mg total) by mouth every 8 (eight) hours as needed.    . betamethasone dipropionate (DIPROLENE) 0.05 % ointment Apply topically 2 (two) times daily.     . carvedilol (COREG) 6.25 MG tablet TAKE ONE TABLET TWICE DAILY WITH A MEAL 180 tablet 0  . ELIQUIS 5 MG TABS tablet TAKE ONE TABLET TWICE DAILY 180 tablet 1  . levETIRAcetam (KEPPRA XR) 500 MG 24 hr tablet Take 500 mg by mouth daily.    [name] loratadine (CLARITIN) 10 MG tablet Take by mouth.     . lovastatin (MEVACOR) 40 MG tablet TAKE TWO TABLETS EACH NIGHT AT BEDTIME 180 tablet 0  . Multiple Vitamins-Minerals (PRESERVISION AREDS) CAPS Take 1 capsule by mouth 2 (two) times daily.     [name] UNABLE TO FIND Bevacizumab(AVASTIN) chemo injection for the eye every 10 weeks     No current facility-administered medications for this visit.    Allergies:   Patient has no known allergies.    Social History:  The patient  reports that he quit smoking  about 40 years ago. His smoking use included cigarettes. He has a 20.00 pack-year smoking history. He has never used smokeless tobacco. He reports current alcohol use of about 14.0 standard drinks of alcohol per week. He reports that he does not use drugs.   Family History:  The patient's family history includes Cancer in his daughter; Congestive Heart Failure in his grandchild; Dementia in his mother; Diabetes in his daughter and mother; Heart disease in his mother and son; Heart disease (age of onset: 70) in his father; Hypertension in his mother; Stroke in his sister.    ROS:  Please see the history of present illness.   Otherwise, review of systems are positive for none.   All other systems are reviewed and negative.    PHYSICAL EXAM: VS:  BP (!) 180/100 (BP Location: Left Arm, Patient Position: Sitting, Cuff Size: Normal)   Pulse 77   Ht  5\' 11"  (1.803 m)   Wt 155 lb (70.3 kg)   SpO2 97%   BMI 21.62 kg/m  , BMI Body mass index is 21.62 kg/m. GEN: Well nourished, well developed, in no acute distress  HEENT: normal  Neck: no JVD or masses. Faint bilateral carotid bruits Cardiac: Regular with premature beats; no rubs, or gallops,no edema . There is a 2/6 crescendo decrescendo murmur in the aortic area which is mid peaking with slightly diminished S2 Respiratory:  clear to auscultation bilaterally, normal work of breathing GI: soft, nontender, nondistended, + BS MS: no deformity or atrophy  Skin: warm and dry, no rash Neuro:  Strength and sensation are intact Psych: euthymic mood, full affect   EKG:  EKG is ordered today. The ekg ordered today demonstrates sinus rhythm with PACs and right bundle branch block.   Recent Labs: 01/23/2020: BUN 19; Creatinine, Ser 0.85; Hemoglobin 13.6; Platelets 278; Potassium 4.4; Sodium 142    Lipid Panel    Component Value Date/Time   CHOL 156 07/26/2019 1222   CHOL 184 10/01/2013 1719   TRIG 112 07/26/2019 1222   TRIG 400 (H) 10/01/2013 1719    HDL 71 07/26/2019 1222   HDL 55 10/01/2013 1719   CHOLHDL 2.2 07/26/2019 1222   CHOLHDL 2.3 05/27/2017 1007   VLDL 28 06/02/2016 0714   VLDL 80 (H) 10/01/2013 1719   LDLCALC 65 07/26/2019 1222   LDLCALC 78 05/27/2017 1007   LDLCALC 49 10/01/2013 1719      Wt Readings from Last 3 Encounters:  08/12/20 155 lb (70.3 kg)  03/05/20 157 lb 12.8 oz (71.6 kg)  02/12/20 157 lb 6.4 oz (71.4 kg)         ASSESSMENT AND PLAN:  1.  Paroxysmal atrial fibrillation: The patient continues to be in sinus rhythm. He is tolerating anticoagulation with no reported side effects.  Labs last year were unremarkable.  2. Essential hypertension:   Blood pressure is extremely high today.  Recheck was 200/100.  I elected to resume amlodipine at a smaller dose of 2.5 mg once daily.  3.  Aortic valve stenosis: This continues to be moderate by exam as well as by echocardiogram.  Given decline in overall health, I am not entirely certain he would be a good candidate for TAVR although he is open for evaluation if needed.  4. Hyperlipidemia: Continue treatment with lovastatin.  Most recent lipid profile showed an LDL of 65.  5. Left carotid stenosis: This was followed in the past by vascular surgery but no recent evaluation.  Given that he is asymptomatic, I am not ordering repeat carotid Doppler at this point.  6.  Pulmonary hypertension: This improved on most recent echocardiogram was moderate.    Disposition:   FU in 6 months.  Signed,  Jeffrey Sacramento, MD  08/12/2020 3:56 PM    [place]

## 2020-08-12 NOTE — Patient Instructions (Signed)
Medication Instructions:  Your physician has recommended you make the following change in your medication:   START Amlodipine 2.5 mg daily. An Rx has been sent to your pharmacy.  *If you need a refill on your cardiac medications before your next appointment, please call your pharmacy*   Lab Work: None ordered If you have labs (blood work) drawn today and your tests are completely normal, you will receive your results only by: [name] MyChart Message (if you have MyChart) OR . A paper copy in the mail If you have any lab test that is abnormal or we need to change your treatment, we will call you to review the results.   Testing/Procedures: None ordered   Follow-Up: At [facility], you and your health needs are our priority.  As part of our continuing mission to provide you with exceptional heart care, we have created designated Provider Care Teams.  These Care Teams include your primary Cardiologist (physician) and Advanced Practice Providers (APPs -  Physician Assistants and Nurse Practitioners) who all work together to provide you with the care you need, when you need it.  We recommend signing up for the patient portal called "MyChart".  Sign up information is provided on this After Visit Summary.  MyChart is used to connect with patients for Virtual Visits (Telemedicine).  Patients are able to view lab/test results, encounter notes, upcoming appointments, etc.  Non-urgent messages can be sent to your provider as well.   To learn more about what you can do with MyChart, go to [email or web address].    Your next appointment:   Your physician wants you to follow-up in: 6 months You will receive a reminder letter in the mail two months in advance. If you don't receive a letter, please call our office to schedule the follow-up appointment.   The format for your next appointment:   In Person  Provider:   You may see Kathlyn Sacramento, MD or one of the following Advanced Practice Providers  on your designated Care Team:    Murray Hodgkins, NP  Christell Faith, PA-C  Marrianne Mood, PA-C  Cadence [place], [place]  Laurann [place], NP    Other Instructions N/A

## 2020-08-13 NOTE — Progress Notes (Signed)
Results reviewed with patient at appointment 08/12/20

## 2020-09-03 ENCOUNTER — Encounter: Payer: Self-pay | Admitting: Adult Health

## 2020-09-03 ENCOUNTER — Ambulatory Visit (INDEPENDENT_AMBULATORY_CARE_PROVIDER_SITE_OTHER): Payer: Medicare Other | Admitting: Adult Health

## 2020-09-03 VITALS — BP 158/82 | HR 70 | Ht 71.0 in | Wt 157.0 lb

## 2020-09-03 DIAGNOSIS — R251 Tremor, unspecified: Secondary | ICD-10-CM | POA: Diagnosis not present

## 2020-09-03 DIAGNOSIS — G40909 Epilepsy, unspecified, not intractable, without status epilepticus: Secondary | ICD-10-CM

## 2020-09-03 MED ORDER — LEVETIRACETAM ER 500 MG PO TB24: 500.0000 mg | ORAL_TABLET | Freq: Every day | ORAL | 3 refills | Status: DC

## 2020-09-03 NOTE — Progress Notes (Signed)
I agree with the above plan 

## 2020-09-03 NOTE — Progress Notes (Signed)
[facility] [address] [place]. [place] [address] [phone]       OFFICE FOLLOW UP NOTE  Jeffrey. Jeffrey [name] Date of Birth:  [birth year] Medical Record Number:  [id number]   Referring MD:   Rudene Re Reason for visit: Seizures [facility] provider: [name]   Chief Complaint  Patient presents with  . Follow-up    RM 34 with wife (rachel) Pt is well, no seizures everything has been pretty normal.      HPI:   Initial visit 05/02/2018 PS: Jeffrey [name] is a pleasant 87 year old Caucasian male was seen today for initial office consultation visit.  He is accompanied by his wife and daughter.  History is obtained from them and review of referral notes.  I have reviewed imaging films in PACS.  He has been having multiple brief episodes of altered awareness for several years but these appear to have increased in the last 6 months and frequency as well as duration.  He had a prolonged episode recently in November 2019 when he went to the ER.  CT scan of the head was obtained which I reviewed showed no acute abnormality except changes of age-related small vessel disease.  He is recently had a few back-to-back episodes as well.  He is unable to identify specific trigger for his episodes.  He is usually staring during these episodes and does not respond to commands.  He does not lose consciousness fall or hurt himself.  His episodes were previously lasting less than a minute the dose of few recent ones have been more prolonged.  Following some of these episodes he tends to get agitated and disoriented.  The patient's daughter feels she is noticed that he does some automatic hand movements and some of these episodes.  He denies any headache or any aura prior to these episodes.  He has no remote history of head injury with loss of consciousness intracranial hemorrhage or strokes.  There is no family history of epilepsy or seizures.  There is no specific pattern for these episodes which may  occur once every few months to several in a week.  He has not had an EEG or MRI scan done or a trial of seizure medications. He had prior history of right anterior frontal lacunar infarct in 11/27/2014 he was felt to have asymptomatic 80 to 90% left ICA stenosis at that time.  Echocardiogram was normal.  He has remote history of [facility] spotted fever with encephalitis.  He was treated with a 2-week course of doxycycline.  He did not have any definite seizures at that time.  He had episode of brief staring with a blank look on his face in May 2016 and was admitted to [facility] where he had an EEG which was negative for seizures.  He was seen by Dr. Theador Hawthorne neurologist as an outpatient and had a negative work-up for his seizures as well.  He was referred to Dr. Juanda Crumble feels vascular surgeon for left carotid stenosis follow-up with ultrasound in the office showed stenosis to be much less and hence conservative follow-up was recommended.  He was seen by me in the office in March 2017 but has not followed up since then.  He was found to subsequently have a transient episode of paroxysmal A. fib and was started on Eliquis by cardiologist [name].  He also had a second EEG done in September 2016 which was also unremarkable except for mild generalized slowing.  Update 08/07/2018  PS : He returns for follow-up after last visit 3 months ago.  He is accompanied by his wife.  He states he is doing well.  Is tolerating Keppra XR 500 mg quite well without any side effects.  He has had no spells like seizures or speech difficulties since the last visit.  He has had no stroke or TIA symptoms either.  He underwent EEG on 06/12/2018 which was read by me and was normal.  MRI scan of the brain done on 06/14/2018 which I have personally reviewed the images shows tiny right posterior temporal white matter lacunar infarct which was clinically silent as patient had no symptoms at that time.  Carotid ultrasound  previously done on 12/23/2017 had shown 40 to 59% left ICA stenosis no significant stenosis on the right.  Transthoracic echo done on 04/05/2018 and showed normal ejection fraction.  Lipid profile done on 07/05/2018 was unremarkable with LDL cholesterol of 66 mg percent, HDL of 59 and total cholesterol 152 mg percent.  Hemoglobin A1c on 06/28/2018 was 6.0.  Patient has been diagnosed with sleep apnea by his pulmonologist Dr Leonidas Romberg and has started using CPAP every night recently.  He has no new complaints today.  He states his tremors are unchanged and are not functionally disabling.  He does follow-up with [name] from vascular surgery for carotid stenosis and will have follow-up carotid ultrasound done in upcoming visit in the spring  Update 02/20/2019 JM: Jeffrey [name] is a 87 year old male who is being seen today for follow-up accompanied by wife.  He has been doing well from a neurological standpoint.  He continues on Keppra XR 500 mg daily without recurrent seizure episodes or speech difficulties.  He did have a ED visit on 12/29/2018 for syncopal episode which was felt likely due to orthostatic hypotension with cardiology follow-up and medications adjusted.  Syncopal episode was not felt to be related to seizures.  Denies new or recurring stroke/TIA symptoms as well.  Upper extremity L>R tremors have been stable and do not interfere with daily functioning.  He remains on Eliquis without bleeding or bruising.  He endorses ongoing compliance with CPAP for OSA management.  Blood pressure today elevated at 189/90 but does endorse elevation at doctor's appointments.  He does monitor at home and typically 120s/70s.  Denies new or worsening stroke/TIA symptoms.  Update 09/03/2019 [name]: He returns for follow-up after last visit 6 months ago.  He states he is doing well but wife states that a few weeks ago she noticed episode of transient unresponsiveness where he was staring ahead and not immediately responsive this  lasted only a few moments and then returned back to baseline but patient did not remember that.  There were no automatic movements or lipsmacking noted.  There is no confusion or disorientation or headache.  There is no apparent trigger.  He remains on Keppra Exar 500 mg daily and is tolerating well without side effects.  He remains on Eliquis for his A. fib and has not had any stroke or TIA symptoms.  His blood pressure is significantly elevated today with the patient's wife claims that this on whitecoat hypertension as it is much better at home.  He continues to have intermittent hand tremor but this is not functionally disabling.  He has no other new complaints.  Update 03/05/2020 [name]: He returns for follow-up after last visit 6 months ago.  Is accompanied by his wife.  Wife feels that these episodes of transient dizziness and  unresponsiveness and staring ahead is still occurring but she is not sure how frequently is happening.  She gets this is probably once a month or so.  He has tolerated increasing the dose of Keppra XR 2000 mg daily without side effects.  He continues to have mild tremors in his hands and head but these are not interfering with day-to-day activities and remain unchanged.  Is tolerating Eliquis well without bruising or bleeding.  He had a repeat EEG done on 11/15/2019 which was normal.  He has no new complaints.  Update 09/03/2020 JM: Returns for 17-month follow-up.  Stable since prior visit without any reoccurring seizures or symptoms.  Remains on Keppra XR but per patient report, only taking 1 tablet at night. At prior visit, reported taking 2 tab for 1000mg  dosing but he denies ever taking 2 tablets at night.  He is tolerating current dose of Keppra without side effects.  Chronic mild upper extremity tremor with mild progression but does not interfer with daily activity. Hx of A. fib on Eliquis routinely monitored by cardiology.  No concerns at this time.    ROS:   14 system  review of systems is positive for those listed in HPI denies and all other systems negative  PMH:  Past Medical History:  Diagnosis Date  . Acute CVA (cerebrovascular accident) ([place])   . Acute encephalopathy 11/27/2014  . Aortic atherosclerosis ([place]) 11/25/2014  . Atrial fibrillation ([place])    a. 12/2018 [place]: Sinus rhythm, avg rate 71. 2 runs of VT (max 17 beats). 27 runs SVT (max 14 beats). Occas PACs/PVCs. No high grade AV block/pauses.  . Chronic combined systolic (congestive) and diastolic (congestive) heart failure ([place])    a. 10/2014 Echo: EF 40-45%; b. 03/2018 Echo: EF 55-60%; c. 12/2018 Echo: EF 55-60%, mild conc LVH. Nl RV fxn. RVSP 51.92mmHg. Mildly dil LA. Mod AS w/ severe Ca2+; d. 12/2019 Echo: EF 50-55%, mild LVH, gr2 DD. Sev elev PASP. Mild Jeffrey, mild to mod TR. Triv AI, mod to sev AS (area 0.77cm^2, mean grad 80mmHg. Vmax 2.52m/s).  . Diabetes mellitus   . Hypercholesteremia   . Hypertension   . Impaired fasting glucose   . Macular degeneration   . Moderate to severe aortic stenosis    a. 12/2018 Echo: EF 55-60%, Mod AS w/ sev AoV Ca2+; b. 12/2019 Echo: EF 50-55%. Mod to sev AS (area 0.77cm^2, mean grad 26mmHg. Vmax 2.36m/s).  . Multiple lacunar infarcts ([place])   . Obstructive sleep apnea 06/27/2018  . Prostate cancer ([place]) radiation + seeding implant (Dr.[name])  . 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 Hilarie Fredrickson)  . [facility] spotted fever) 10/2014   (hosp with FUO, encephalopathy)  . Rotator cuff tear, right 10/2010   supraspinatous and infraspinatous  . Seizure disorder ([place]) 07/25/2019  . TIA (transient ischemic attack) 10/02/13   hosp at [facility] x 1 night  . 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 Marital status: Married    Spouse name: [name]  . Number of children: 4  . Years of education: Not on file  . Highest education  level: Not on file  Occupational History  . Occupation: Retired  Tobacco Use  . Smoking status: Former Smoker    Packs/day: 1.00    Years: 20.00    Pack years: 20.00    Types: Cigarettes    Quit  date: 05/31/1980    Years since quitting: 40.2  . Smokeless tobacco: Never Used  Vaping Use  . Vaping Use: Never used  Substance and Sexual Activity  . Alcohol use: Yes    Alcohol/week: 14.0 standard drinks    Types: 14 Cans of beer per week    Comment: 2 cans of beer most days of the week  . Drug use: No  . Sexual activity: Not Currently    Partners: Female    Comment: issues with ED  Other Topics Concern  . Not on file  Social History Narrative   Married. Retired Engineer, structural.  2 sons, 2 daughters (all in [place]), 15 grandchildren, 7 great grandchild, 2 more expected   Lives with wife   Right Handed   Drinks 2 cups of caffeine daily   Social Determinants of Health   Financial Resource Strain: Not on file  Food Insecurity: Not on file  Transportation Needs: Not on file  Physical Activity: Not on file  Stress: Not on file  Social Connections: Not on file  Intimate Partner Violence: Not on file    Medications:   Current Outpatient Medications on File Prior to Visit  Medication Sig Dispense Refill  . acetaminophen (TYLENOL ARTHRITIS PAIN) 650 MG CR tablet Take 1 tablet (650 mg total) by mouth every 8 (eight) hours as needed.    [name] amLODipine (NORVASC) 2.5 MG tablet Take 1 tablet (2.5 mg total) by mouth daily. 90 tablet 3  . betamethasone dipropionate (DIPROLENE) 0.05 % ointment Apply topically 2 (two) times daily.     . carvedilol (COREG) 6.25 MG tablet TAKE ONE TABLET TWICE DAILY WITH A MEAL 180 tablet 0  . ELIQUIS 5 MG TABS tablet TAKE ONE TABLET TWICE DAILY 180 tablet 1  . levETIRAcetam (KEPPRA XR) 500 MG 24 hr tablet Take 500 mg by mouth daily.    [name] loratadine (CLARITIN) 10 MG tablet Take by mouth.     . lovastatin (MEVACOR) 40 MG tablet TAKE TWO TABLETS EACH NIGHT AT BEDTIME  180 tablet 0  . Multiple Vitamins-Minerals (PRESERVISION AREDS) CAPS Take 1 capsule by mouth 2 (two) times daily.     [name] UNABLE TO FIND Bevacizumab(AVASTIN) chemo injection for the eye every 10 weeks     No current facility-administered medications on file prior to visit.    Allergies:  No Known Allergies  Physical Exam  Today's Vitals   09/03/20 0928  BP: (!) 158/82  Pulse: 70  Weight: 157 lb (71.2 kg)  Height: 5\' 11"  (1.803 m)   Body mass index is 21.9 kg/m.   General: Pleasant frail elderly Caucasian male seated, in no evident distress Head: head normocephalic and atraumatic.   Neck: supple with soft left  carotid   bruit Cardiovascular: regular rate and rhythm, no murmurs Musculoskeletal: no deformity Skin:  no rash/petichiae Vascular:  Normal pulses all extremities  Neurologic Exam Mental Status: Awake and fully alert.  Fluent speech and language.  Oriented to place and time. Recent and remote memory intact. Attention span, concentration and fund of knowledge appropriate. Mood and affect appropriate.   Cranial Nerves: Pupils equal, briskly reactive to light. Extraocular movements full without nystagmus. Visual fields full to confrontation.  HOH bilaterally. Facial sensation intact. Face, tongue, palate moves normally and symmetrically.  Motor: Normal bulk and tone. Normal strength in all tested extremity muscles. Mild to moderate action tremor of outstretched upper extremities left greater than right. Mild LUE tremor at rest. Intermittent head tremor.  Mild Cogwheel  rigidity left greater than right wrist upon activation mainly.  No bradykinesia.   Sensory.: intact to touch , pinprick , position and vibratory sensation.  Coordination: Rapid alternating movements normal in all extremities. Finger-to-nose and heel-to-shin performed accurately bilaterally. Gait and Station: Arises from chair without difficulty. Stance is normal. Gait demonstrates normal stride length and balance  but slight diminished left arm swing. No festination or stooped posture. Able to heel, toe and tandem walk with moderate difficulty.   Reflexes: 1+ and symmetric. Toes downgoing.      ASSESSMENT/PLAN: 87 year long-standing recurrent transient stereotypical episodes of altered awareness with speech difficulties possibly complex partial seizures.  Episodes seem infrequent now on Keppra but not sure if it is being effective or not he also has mild left greater than right upper extremity and head tremor possibly early parkinsonian tremor vs essential tremor which does not appear to be functionally disabling     Complex partial seizures Stable without any recent seizure events Prior event 1 year ago - [name] recommended increasing Keppra dosage to 1000 mg nightly but per pt, he has always been taking just 1 500mg  tab at night - this was confirmed with his pharmacy As he has not had any additional events over the past year, will continue his current dosage -refill provided Discussed avoidance of seizure provoking triggers and activities and to call office with any seizure events  Tremor Mild progression per pt report Does not interfere with daily activity or functioning therefore will hold off on any medication usage Continue to monitor   Follow-up in 6 months or call earlier if needed   CC:  [facility] provider: Rita Ohara, MD   I spent 25 minutes of face-to-face and non-face-to-face time with patient and wife.  This included previsit chart review, lab review, study review, order entry, electronic health record documentation, patient education and discussion regarding hx of seizures and use of AED, seizure triggers and avoidance, chronic tremor and continued monitoring and answered all other questions to patient and wifes satisfaction   Frann Rider, AGNP-BC  [facility] [address] [place], [place] [address]  Phone [phone] Fax [phone] Note:  This document was prepared with digital dictation and possible smart phrase technology. Any transcriptional errors that result from this process are unintentional.

## 2020-09-03 NOTE — Patient Instructions (Signed)
Your Plan:  Continue Keppra XR 500mg  nightly  We will continue to monitor your tremors -if they become worse or start to interfere with daily functioning, please let us know    Follow-up in 6 months or call earlier if needed     Thank you for coming to see [place] at [facility]. I hope we have been able to provide you high quality care today.  You may receive a patient satisfaction survey over the next few weeks. We would appreciate your feedback and comments so that we may continue to improve ourselves and the health of our patients.

## 2020-09-07 NOTE — Patient Instructions (Addendum)
HEALTH MAINTENANCE RECOMMENDATIONS:  It is recommended that you get at least 30 minutes of aerobic exercise at least 5 days/week (for weight loss, you may need as much as 60-90 minutes). This can be any activity that gets your heart rate up. This can be divided in 10-15 minute intervals if needed, but try and build up your endurance at least once a week.  Weight bearing exercise is also recommended twice weekly.  Eat a healthy diet with lots of vegetables, fruits and fiber.  "Colorful" foods have a lot of vitamins (ie green vegetables, tomatoes, red peppers, etc).  Limit sweet tea, regular sodas and alcoholic beverages, all of which has a lot of calories and sugar.  Up to 2 alcoholic drinks daily may be beneficial for men (unless trying to lose weight, watch sugars).  Drink a lot of water.  Sunscreen of at least SPF 30 should be used on all sun-exposed parts of the skin when outside between the hours of 10 am and 4 pm (not just when at beach or pool, but even with exercise, golf, tennis, and yard work!)  Use a sunscreen that says "broad spectrum" so it covers both UVA and UVB rays, and make sure to reapply every 1-2 hours.  Remember to change the batteries in your smoke detectors when changing your clock times in the spring and fall.  Carbon monoxide detectors are recommended for your home.  Use your seat belt every time you are in a car, and please drive safely and not be distracted with cell phones and texting while driving.    Jeffrey [name] , Thank you for taking time to come for your Medicare Wellness Visit. I appreciate your ongoing commitment to your health goals. Please review the following plan we discussed and let me know if I can assist you in the future.    This is a list of the screening recommended for you and due dates:  Health Maintenance  Topic Date Due  . Tetanus Vaccine  10/30/2014  . Urine Protein Check  07/25/2020  . COVID-19 Vaccine (4 - Booster for Pfizer series) 09/05/2020   . Flu Shot  12/29/2020  . Pneumonia vaccines  Completed  . HPV Vaccine  Aged Out   You are very past due in getting your tetanus booster.  We have been talking about this for over 5 years now.  If you have Medicare part D, you need to get this from the pharmacy (covered by pharmacy benefit).  If you don't have Part D, then you will have to pay full price.  The same goes for the Shingles vaccine, which is also recommended.  This is a series of 2 shots, given 2 months apart.  This vaccine is more expensive than the tetanus--if you don't have Medicare part D, I recommend calling [employer] or [employer] to check on price).  All vaccines need to be separated from each other by at least 2 weeks.  Please get [place] copies of your living will and healthcare power of attorney once completed and notarized.  You should get a hearing evaluation at a hearing center.  It is important to have sleep apnea treated.  It is contributing pulmonary hypertension (elevated pressures, seen on echocardiogram), as well as possibly your high blood pressures.  Options for treatment include using CPAP--you should discuss treatment options for your urinary frequency at night if you were interested in resuming CPAP (I believe this is why you stopped using it in the past). A new treatment  option is call [facility].  Feel free to research this.  If interested, I would refer you to an ENT (it is a minor surgical procedure).  Please set up routine dental cleaning.  We are checking your PSA with your labs today.  We will be referring you to [facility] for follow up on your prostate cancer and urinary complaints (and will forward them the results). Someone should contact you about an appointment--let us know in 2 weeks if you haven't heard anything.  Try and limit dairy, until bowels are better. Try and avoid fried or greasy foods.  You may want to keep track of what you're eating in relation to your diarrhea, to see if you can find a  trigger, which you can then avoid.

## 2020-09-07 NOTE — Progress Notes (Signed)
Chief Complaint  Patient presents with  . Medicare Wellness    Fasting AWV. Has not gotten Tdap, would like covid booster today. Did get flu shot at pharmacy, abstracted today. Is not using CPAP.     Jeffrey [name] is a 87 y.o. male who presents for annual wellness visit and follow-up on chronic medical conditions.   He is unaccompanied today (daughter is in waiting room, drove him).  He notes bilateral hearing loss, asking about whether or not he needs evaluation/hearing aids.  Today he reports weight loss, recalling that he used to be in the 170's. Chart reviewed--Last visit in this office was 01/2020, weight 157# 6.4 oz. Weight was 172# 3.2 oz in 07/2019, 173# in 11/2018. He reports he and his wife have been cooking more at home, and that she has also lost weight.  Atrial fibrillation and hypertension: He saw [name] last in March.  His BP was very high at that time, and he recommended restarting amlodipine 2.75m daily. He continues on carvedilol 6.280mBID.  (lisinopril and amlodipine had been stopped in the past due to low BP's/orthostasis). BP's have been running 86-122/48-68 (once was 138, twice had systolic of 86).  Later in the day 120's, 130's, max of 15956ystolic (per his memory, only morning values were brought in). He feels a little woozy when the BP's are low. He drinks a couple of large glasses of water daily. Still having a couple of beers/day. He denies headaches, syncope, chest pain, palpitations, edema. Occasional dizziness/swimmy-headed when BP is low.  Last echo was 12/2019, showing moderate to severe AS, mod pulm hypertension.  H/o Stroke (10/2014) and seizures--He last saw Dr. SeLeonie Mann April.  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 He hasn't had any seizures, continued on 50069meppra (since he never increased to 1000m29m had been recommended at one point).  He remains on Eliquis for  anticoagulation related to afib for prevention of further strokes.  He denies any bleeding, bruising. He has persistent tremor, but reports it is not bothersome; mild progression noted but no treatment was recommended.   OSA andPulmonary hypertension:  He reported at his last visit that he was not using his CPAP, too "aggravating", related to his urinary frequency (up 5-6 times/night to void.) He admits that he has not been using his CPAP.  IFG--He doesn't check his sugars. He goes to the eye doctor regularly, for macular degeneration, getting injections(Dr. GrevCordelia Pene denies any foot tingling or sores. Previously reported--"My toes feel like I have extra skin or wearing socks all the time"--notes this intermittently, but reports this has improved.  He now sees a foot doctor. He has urinary frequency x several years (related to prostate). He no longer has as much frequency during the day, now mainly only at night.  Denies polydipsia. At his last visit, A1c was higher (6% in Sept, had been 5.5% in 07/2019). He reported eating more junk (peanut butter crackers, cheese, cookies). He has cut back on these snacks. Lab Results  Component Value Date   HGBA1C 6.0 (A) 02/12/2020   Microalbuminuria noted in 04/2017, in 05/2018 was normal.  Urine microalbumin/Cr ratio was up to 31on last check in 07/2019, when no longer taking lisinopril.  Hyperlipidemia: Compliant with medications (80mg78mlovastatin); denies side effects. Tries to follow a lowfat, low cholesterol diet, cut back on fried foods. Eats beef (3x/week), a lot of seafood. He is back  to drinking 2 beers/day. Due for recheck of lipids. Lab Results  Component Value Date   CHOL 156 07/26/2019   HDL 71 07/26/2019   LDLCALC 65 07/26/2019   TRIG 112 07/26/2019   CHOLHDL 2.2 07/26/2019   Prostate cancer: Previously monitored by [name] every 6 months. He underwent internal urethrotomy, cystourethroscopy and circumcision on  02/15/17. He reportednot reallynoticing improvement since treatment of the stricture,continues with frequency and leakage.Today he reports he has less daytime frequency, now mainly only at night. His last visit with [name] was in [place] was <0.01. Repeat cystoscopywas recommendedto look for recurrent stricture, given persistent/worsening urinary frequency,dribblingand nocturia of 4-5x/night. He was hesitant to do this, since it didn't help at all last time. He prefers less invasive options, though hadn't discussed this with urologist.  Just hadn't gone back to see [name]. He was offered 2nd opinion at [facility] at his last visit, was going to think about it.  He still has not seen [name].   Immunization History  Administered Date(s) Administered  . Influenza Split 01/30/2012, 02/03/2014, 03/25/2015  . Influenza, High Dose Seasonal PF 03/17/2016, 03/04/2017, 02/22/2018  . Influenza-Unspecified 01/29/2013, 03/25/2015, 03/14/2019  . PFIZER(Purple Top)SARS-COV-2 Vaccination 06/06/2019, 06/27/2019, 03/07/2020  . Pneumococcal Conjugate-13 10/17/2014  . Pneumococcal Polysaccharide-23 12/17/2004, 10/02/2012  . Td 10/29/2004   Tdap recommended the last 5 years (needs to get from pharmacy), hasn't gotten yet Last colonoscopy: 11/2012 with Dr. Hilarie Fredrickson (radiation proctitis)  Last PSA: per urologist (past due to see); <0.01 11/2018  Dentist:twice yearly, past due related to COVID--hasn't gone since [place]. Ophtho: twice yearly, though now getting injections frequently. Vision is improved on R, worse on the L Exercise:Very little--Walks the dogsusually 15 minutes 2x/day, daily. Admits it is not aerobic (stops/sniffs a lot, slow). Sometimes he walks on his own, without the dog. Has some hand weights, uses 2-3x/week.   Other doctors caring for patient include:  GI--Dr. Hilarie Fredrickson  Urologist: [name]  Dentist: Dr.Taloopis Ophtho: [name] (at [facility] Dr. Valetta Close Cardiologist:  [name] Ortho: Dr. Berenice Primas (no longer under his care; problem resolved)  Neuro: [name] Pulmonary: Dr. Alva Garnet (no longer sees) Vascular: [name] Derm: Dr. Evorn Gong in [place] Podiatrist: Dr. Prudence Davidson  Depression screen: negative Fall screen: None Functional status survey--notable for decreased hearing and vision(macular degeneration); leakage of urine; Some short-term memory issues (forgetting names of people he hasn't seen in a long time, sometimes forgetting why he walked into a room), seems to be more noticeable, more often than last year. Stopped driving last year. Mini-Cog screen: normal See full screens in Epic  End of Life Discussion: He has been given forms in the past, but still hasn't done, Given another set and counseled 2 years ago, when he said he still had paperwork at home.   PMH, PSH, SH and FH were reviewed and updated  Outpatient Encounter Medications as of 09/08/2020  Medication Sig  . acetaminophen (TYLENOL ARTHRITIS PAIN) 650 MG CR tablet Take 1 tablet (650 mg total) by mouth every 8 (eight) hours as needed.  [name] amLODipine (NORVASC) 2.5 MG tablet Take 1 tablet (2.5 mg total) by mouth daily.  . carvedilol (COREG) 6.25 MG tablet TAKE ONE TABLET TWICE DAILY WITH A MEAL  . ELIQUIS 5 MG TABS tablet TAKE ONE TABLET TWICE DAILY  . levETIRAcetam (KEPPRA XR) 500 MG 24 hr tablet Take 1 tablet (500 mg total) by mouth daily.  [name] loratadine (CLARITIN) 10 MG tablet Take by mouth.   . lovastatin (MEVACOR)  40 MG tablet TAKE TWO TABLETS EACH NIGHT AT BEDTIME  . Multiple Vitamins-Minerals (PRESERVISION AREDS) CAPS Take 1 capsule by mouth 2 (two) times daily.   [name] UNABLE TO FIND Bevacizumab(AVASTIN) chemo injection for the eye every 10 weeks  . betamethasone dipropionate (DIPROLENE) 0.05 % ointment Apply topically 2 (two) times daily.  (Patient not taking: Reported on 09/08/2020)   No facility-administered encounter medications on file as of 09/08/2020.   No Known  Allergies  ROS: The patient denies anorexia, fever, headaches, ear pain, hoarseness, chest pain, palpitations, dyspnea on exertion, cough, swelling, nausea, vomiting, constipation, abdominal pain, melena, hematochezia, indigestion/heartburn, dysuria, genital lesions, numbness, tingling, weakness, suspicious skin lesions, depression, anxiety, abnormal bleeding/bruising, or enlarged lymph nodes.  +bilateral hearing loss Some chronic runny nose, unchanged Vision loss related to macular degeneration. Worse on the L, improving on the R. Has some urge incontinence, some dribbling, and urinary frequency and nocturia. No bleeding, bruising. No further episodes staring spells since on Keppra Some intermittent diarrhea, a little worse in the last few weeks. No blood in the stool.  Occ uses imodium with good results.   PHYSICAL EXAM:  BP (!) 180/98   Pulse 84   Ht _0  (1.803 m)   Wt 152 lb 3.2 oz (69 kg)   BMI 21.23 kg/m   Wt Readings from Last 3 Encounters:  09/03/20 157 lb (71.2 kg)  08/12/20 155 lb (70.3 kg)  03/05/20 157 lb 12.8 oz (71.6 kg)    General Appearance:  Alert, cooperative, no distress, appears stated age.  Head:  Normocephalic, without obvious abnormality, atraumatic   Eyes:  PERRL, conjunctiva/corneas clear, EOM's intact. Fundi not examined  Ears:  Normal TM's and external ear canals. No cerumen present  Nose:  Not examined, wearing mask due to COVID-19 pandemic  Throat:  Not examined, wearing mask due to COVID-19 pandemic  Neck:  Supple, no lymphadenopathy; thyroid: no enlargement/tenderness/nodules; bruit noted bilaterally (vs radiation of murmur).  Back:  Spine nontender, no curvature, ROM normal, no CVA tenderness   Lungs:  Clear to auscultation bilaterally without wheezes, rales or ronchi; respirations unlabored   Chest Wall:  No tenderness or deformity   Heart:  Irregularly irregular. 3/6 SEM heard throughout, loudest at RUSB. Harsh-sounding   Breast Exam:  No chest wall tenderness, masses or gynecomastia   Abdomen:  Soft, non-tender, nondistended, normoactive bowel sounds, no masses, no hepatosplenomegaly. Improvement in central obesity (stomach is smaller)  Genitalia:  Deferred to urologist   Rectal:  Deferred to urologist  Extremities:  No clubbing, cyanosis or edema.    Pulses:  2+ and symmetric all extremities   Skin:  Skin color, texture, turgor normal, no rashes or lesions; Large SK on right face  Lymph nodes:  Cervical, supraclavicular, and axillary nodes normal   Neurologic:  Normal strength, sensation and gait; reflexes 2+ and symmetric throughout. Some mild resting tremor noted, R>L, and slight noted in head intermittently   Psych: Normal mood, affect. Today he is wearing sweatpants (with suspenders to keep them up), and some flaking is present on his clothing. Change in normal grooming/appearance.   Lab Results  Component Value Date   HGBA1C 5.6 09/08/2020    ASSESSMENT/PLAN:   Medicare annual wellness visit, subsequent  Essential hypertension, benign - managed by [name]. Amlodipine 2.59m recently restarted, along with carvedilol. BP's very low in morning. May need to cut back.  - Plan: Comprehensive metabolic panel  Paroxysmal atrial fibrillation (HCC) - anticoagulated, rate is controlled - Plan:  TSH  Aortic atherosclerosis (HCC) - on statin  Pulmonary hypertension (HCC) - stable per last echo; noncompliant with treatment of OSA, discussed options.  Obstructive sleep apnea - noncompliant with CPAP, in part due to nocturia. Discussed Inspire vs CPAP, and reviewed risks of untreated OSA  Personal history of prostate cancer - past due for PSA and f/u with urologist. PSA done today. Refer to [facility] (prefers not to stay with [name]) - Plan: PSA, Ambulatory referral to Urology  Pre-diabetes - A1c improved. Encouraged daily exercise, low sugar/carb diet - Plan: HgB  A1c  Aortic valve stenosis, etiology of cardiac valve disease unspecified - asymptomatic at this point (mod-severe per last echo)  Seizure disorder (HCC) - cont Keppra.  Hasn't had seizures  Microalbuminuria - not currently on ACEI.  Technically not diabetic, just pre-diabetic.  - Plan: Microalbumin / creatinine urine ratio  Long term current use of anticoagulant - Plan: CBC with Differential/Platelet  Left carotid artery stenosis - asymptomatic  Need for COVID-19 vaccine - Plan: PFIZER Comirnaty(GRAY TOP)COVID-19 Vaccine  Medication monitoring encounter - Plan: CBC with Differential/Platelet, Comprehensive metabolic panel, Lipid panel  Pure hypercholesterolemia - cont statin, low cholesterol diet - Plan: Lipid panel, lovastatin (MEVACOR) 40 MG tablet  Nocturia - Plan: Ambulatory referral to Urology  Bilateral hearing loss, unspecified hearing loss type - suspect age-related. Normal exam. Encouraged eval at hearing center  Weight loss - fairly stable over the last 6 mos. Since wife also losing, may be related to change in diet. Will need further eval if losses continue with good appetite   A1c, c-met, cbc, lipids, urine microalb, TSH, PSA (since not done by urology) Refer to [facility]  Recommended at least 30 minutes of aerobic activity at least 5 days/week, weight-bearing exercise 2x/wk; proper sunscreen use reviewed; healthy diet and alcohol recommendations (1 drink/day or less preferred, occasionally 2) reviewed; regular seatbelt use; changing batteries in smoke detectors. Immunization recommendations --past due forTdap, encouraged him to get from pharmacy. Discussed Shingrix as well (from pharmacy).  Continue yearly flu shots.  COVID booster given. Colonoscopy recommendations reviewed--UTD.  Did not review MOST form today. Discussed living will and healthcare POA again, and encouraged completion and to give [place] copies to scan into medical chart.  F/u 6 mos, sooner prn  Note  sent to [name] regarding low BP's  Medicare Attestation I have personally reviewed: The patient's medical and social history Their use of alcohol, tobacco or illicit drugs Their current medications and supplements The patient's functional ability including ADLs,fall risks, home safety risks, cognitive, and hearing and visual impairment Diet and physical activities Evidence for depression or mood disorders  The patient's weight, height, BMI have been recorded in the chart.  I have made referrals, counseling, and provided education to the patient based on review of the above and I have provided the patient with a written personalized care plan for preventive services.

## 2020-09-08 ENCOUNTER — Ambulatory Visit (INDEPENDENT_AMBULATORY_CARE_PROVIDER_SITE_OTHER): Payer: Medicare Other | Admitting: Family Medicine

## 2020-09-08 ENCOUNTER — Other Ambulatory Visit: Payer: Self-pay

## 2020-09-08 ENCOUNTER — Encounter: Payer: Self-pay | Admitting: Family Medicine

## 2020-09-08 VITALS — BP 180/98 | HR 84 | Ht 71.0 in | Wt 152.2 lb

## 2020-09-08 DIAGNOSIS — I350 Nonrheumatic aortic (valve) stenosis: Secondary | ICD-10-CM

## 2020-09-08 DIAGNOSIS — R7303 Prediabetes: Secondary | ICD-10-CM | POA: Diagnosis not present

## 2020-09-08 DIAGNOSIS — R634 Abnormal weight loss: Secondary | ICD-10-CM

## 2020-09-08 DIAGNOSIS — Z5181 Encounter for therapeutic drug level monitoring: Secondary | ICD-10-CM

## 2020-09-08 DIAGNOSIS — I480 Paroxysmal atrial fibrillation: Secondary | ICD-10-CM | POA: Diagnosis not present

## 2020-09-08 DIAGNOSIS — Z8546 Personal history of malignant neoplasm of prostate: Secondary | ICD-10-CM | POA: Diagnosis not present

## 2020-09-08 DIAGNOSIS — I6522 Occlusion and stenosis of left carotid artery: Secondary | ICD-10-CM | POA: Diagnosis not present

## 2020-09-08 DIAGNOSIS — I700 Atherosclerosis of aorta: Secondary | ICD-10-CM

## 2020-09-08 DIAGNOSIS — E7800 Pure hypercholesterolemia, unspecified: Secondary | ICD-10-CM | POA: Diagnosis not present

## 2020-09-08 DIAGNOSIS — G4733 Obstructive sleep apnea (adult) (pediatric): Secondary | ICD-10-CM | POA: Diagnosis not present

## 2020-09-08 DIAGNOSIS — G40909 Epilepsy, unspecified, not intractable, without status epilepticus: Secondary | ICD-10-CM | POA: Diagnosis not present

## 2020-09-08 DIAGNOSIS — Z7901 Long term (current) use of anticoagulants: Secondary | ICD-10-CM | POA: Diagnosis not present

## 2020-09-08 DIAGNOSIS — R251 Tremor, unspecified: Secondary | ICD-10-CM

## 2020-09-08 DIAGNOSIS — R351 Nocturia: Secondary | ICD-10-CM

## 2020-09-08 DIAGNOSIS — R809 Proteinuria, unspecified: Secondary | ICD-10-CM | POA: Diagnosis not present

## 2020-09-08 DIAGNOSIS — I272 Pulmonary hypertension, unspecified: Secondary | ICD-10-CM | POA: Diagnosis not present

## 2020-09-08 DIAGNOSIS — Z23 Encounter for immunization: Secondary | ICD-10-CM

## 2020-09-08 DIAGNOSIS — Z0000 Encounter for general adult medical examination without abnormal findings: Secondary | ICD-10-CM

## 2020-09-08 DIAGNOSIS — H9193 Unspecified hearing loss, bilateral: Secondary | ICD-10-CM

## 2020-09-08 DIAGNOSIS — I10 Essential (primary) hypertension: Secondary | ICD-10-CM

## 2020-09-08 LAB — POCT GLYCOSYLATED HEMOGLOBIN (HGB A1C): Hemoglobin A1C: 5.6 % (ref 4.0–5.6)

## 2020-09-09 LAB — COMPREHENSIVE METABOLIC PANEL
ALT: 15 IU/L (ref 0–44)
AST: 18 IU/L (ref 0–40)
Albumin/Globulin Ratio: 1.5 (ref 1.2–2.2)
Albumin: 4.5 g/dL (ref 3.6–4.6)
Alkaline Phosphatase: 41 IU/L — ABNORMAL LOW (ref 44–121)
BUN/Creatinine Ratio: 25 — ABNORMAL HIGH (ref 10–24)
BUN: 24 mg/dL (ref 8–27)
Bilirubin Total: 0.5 mg/dL (ref 0.0–1.2)
CO2: 23 mmol/L (ref 20–29)
Calcium: 9.8 mg/dL (ref 8.6–10.2)
Chloride: 102 mmol/L (ref 96–106)
Creatinine, Ser: 0.97 mg/dL (ref 0.76–1.27)
Globulin, Total: 3 g/dL (ref 1.5–4.5)
Glucose: 112 mg/dL — ABNORMAL HIGH (ref 65–99)
Potassium: 5.1 mmol/L (ref 3.5–5.2)
Sodium: 142 mmol/L (ref 134–144)
Total Protein: 7.5 g/dL (ref 6.0–8.5)
eGFR: 77 mL/min/{1.73_m2} (ref >59)

## 2020-09-09 LAB — CBC WITH DIFFERENTIAL/PLATELET
Basophils Absolute: 0.1 10*3/uL (ref 0.0–0.2)
Basos: 1 %
EOS (ABSOLUTE): 0.1 10*3/uL (ref 0.0–0.4)
Eos: 1 %
Hematocrit: 39.4 % (ref 37.5–51.0)
Hemoglobin: 13.2 g/dL (ref 13.0–17.7)
Immature Grans (Abs): 0 10*3/uL (ref 0.0–0.1)
Immature Granulocytes: 0 %
Lymphocytes Absolute: 2.5 10*3/uL (ref 0.7–3.1)
Lymphs: 33 %
MCH: 32.4 pg (ref 26.6–33.0)
MCHC: 33.5 g/dL (ref 31.5–35.7)
MCV: 97 fL (ref 79–97)
Monocytes Absolute: 0.9 10*3/uL (ref 0.1–0.9)
Monocytes: 12 %
Neutrophils Absolute: 4.1 10*3/uL (ref 1.4–7.0)
Neutrophils: 53 %
Platelets: 284 10*3/uL (ref 150–450)
RBC: 4.07 x10E6/uL — ABNORMAL LOW (ref 4.14–5.80)
RDW: 11.8 % (ref 11.6–15.4)
WBC: 7.6 10*3/uL (ref 3.4–10.8)

## 2020-09-09 LAB — PSA: Prostate Specific Ag, Serum: <0.1 ng/mL (ref 0.0–4.0)

## 2020-09-09 LAB — LIPID PANEL
Chol/HDL Ratio: 2.2 ratio (ref 0.0–5.0)
Cholesterol, Total: 154 mg/dL (ref 100–199)
HDL: 70 mg/dL (ref >39)
LDL Chol Calc (NIH): 68 mg/dL (ref 0–99)
Triglycerides: 85 mg/dL (ref 0–149)
VLDL Cholesterol Cal: 16 mg/dL (ref 5–40)

## 2020-09-09 LAB — TSH: TSH: 1.92 u[IU]/mL (ref 0.450–4.500)

## 2020-09-09 LAB — MICROALBUMIN / CREATININE URINE RATIO
Creatinine, Urine: 106.8 mg/dL
Microalb/Creat Ratio: 15 mg/g creat (ref 0–29)
Microalbumin, Urine: 16.2 ug/mL

## 2020-09-09 MED ORDER — LOVASTATIN 40 MG PO TABS: ORAL_TABLET | ORAL | 3 refills | Status: DC

## 2020-09-25 DIAGNOSIS — H353231 Exudative age-related macular degeneration, bilateral, with active choroidal neovascularization: Secondary | ICD-10-CM | POA: Diagnosis not present

## 2020-09-25 DIAGNOSIS — H43813 Vitreous degeneration, bilateral: Secondary | ICD-10-CM | POA: Diagnosis not present

## 2020-09-26 ENCOUNTER — Ambulatory Visit: Payer: Self-pay | Admitting: Urology

## 2020-10-01 ENCOUNTER — Other Ambulatory Visit: Payer: Self-pay

## 2020-10-01 ENCOUNTER — Ambulatory Visit (INDEPENDENT_AMBULATORY_CARE_PROVIDER_SITE_OTHER): Payer: Medicare Other | Admitting: Urology

## 2020-10-01 ENCOUNTER — Encounter: Payer: Self-pay | Admitting: Urology

## 2020-10-01 VITALS — BP 141/82 | HR 74 | Ht 71.0 in | Wt 160.0 lb

## 2020-10-01 DIAGNOSIS — Z8546 Personal history of malignant neoplasm of prostate: Secondary | ICD-10-CM | POA: Diagnosis not present

## 2020-10-01 DIAGNOSIS — R351 Nocturia: Secondary | ICD-10-CM

## 2020-10-01 DIAGNOSIS — Z87448 Personal history of other diseases of urinary system: Secondary | ICD-10-CM

## 2020-10-01 DIAGNOSIS — R350 Frequency of micturition: Secondary | ICD-10-CM

## 2020-10-01 NOTE — Progress Notes (Signed)
10/01/2020 7:08 AM   Willaim [name] 06/06/1932 [id number]  Referring provider: Rita Ohara, [place],  [place] [address]  No chief complaint on file.   HPI: Jeffrey [name] is an 87 y.o. male referred for evaluation of lower urinary tract symptoms and history of prostate cancer.   Prior patient [name] in [facility] who he last saw June 2020  Completed IMRT + low-dose brachytherapy for Gleason 3+4 prostate cancer March 2011  PSA has remained undetectable and last checked April 2022  <0.1  Internal urethrotomy and circumcision September 2018 which did not improve his lower urinary tract symptoms  At his last visit June 2020 complaining nocturia x5-6  Presently on average complains of nocturia x4-5.  Daytime urinary frequency but no incontinence  Follow-up cystoscopy was recommended to evaluate for recurrent stricture however he elected to hold off; no change in symptoms since that visit  IPSS  + Snoring; denies prior sleep study    PMH: Past Medical History:  Diagnosis Date  . Acute CVA (cerebrovascular accident) ([place])   . Acute encephalopathy 11/27/2014  . Aortic atherosclerosis ([place]) 11/25/2014  . Atrial fibrillation ([place])    a. 12/2018 [place]: Sinus rhythm, avg rate 71. 2 runs of VT (max 17 beats). 27 runs SVT (max 14 beats). Occas PACs/PVCs. No high grade AV block/pauses.  . Chronic combined systolic (congestive) and diastolic (congestive) heart failure ([place])    a. 10/2014 Echo: EF 40-45%; b. 03/2018 Echo: EF 55-60%; c. 12/2018 Echo: EF 55-60%, mild conc LVH. Nl RV fxn. RVSP 51.7mmHg. Mildly dil LA. Mod AS w/ severe Ca2+; d. 12/2019 Echo: EF 50-55%, mild LVH, gr2 DD. Sev elev PASP. Mild MR, mild to mod TR. Triv AI, mod to sev AS (area 0.77cm^2, mean grad 36mmHg. Vmax 2.86m/s).  . Diabetes mellitus   . Hypercholesteremia   . Hypertension   . Impaired fasting glucose   . Macular degeneration   . Moderate to severe aortic stenosis    a.  12/2018 Echo: EF 55-60%, Mod AS w/ sev AoV Ca2+; b. 12/2019 Echo: EF 50-55%. Mod to sev AS (area 0.77cm^2, mean grad 39mmHg. Vmax 2.91m/s).  . Multiple lacunar infarcts ([place])   . Obstructive sleep apnea 06/27/2018  . Prostate cancer ([place]) radiation + seeding implant (Dr.[name])  . 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 Hilarie Fredrickson)  . [facility] spotted fever) 10/2014   (hosp with FUO, encephalopathy)  . Rotator cuff tear, right 10/2010   supraspinatous and infraspinatous  . Seizure disorder ([place]) 07/25/2019  . TIA (transient ischemic attack) 10/02/13   hosp at [facility] x 1 night  . Type 2 diabetes mellitus with microalbuminuria or microproteinuria 01/15/2014    Surgical History: Past Surgical History:  Procedure Laterality Date  . CATARACT EXTRACTION Right 01/2017  . CIRCUMCISION  01/2017   [name]  . COLONOSCOPY N/A 11/28/2012   Procedure: COLONOSCOPY;  Surgeon: Jerene Bears, MD;  Location: [facility] ENDOSCOPY;  Service: Gastroenterology;  Laterality: N/A;  . INTERNAL URETHROTOMY  01/27/2016   WF  . prostate seed implant    . ROTATOR CUFF REPAIR  06/16/2011   right (Dr. Berenice Primas)  . TONSILLECTOMY AND ADENOIDECTOMY  age 41    Home Medications:  Allergies as of 10/01/2020   No Known Allergies     Medication List       Accurate as of Oct 01, 2020  7:08 AM. If you have any questions, ask your nurse or  doctor.        acetaminophen 650 MG CR tablet Commonly known as: Tylenol Arthritis Pain Take 1 tablet (650 mg total) by mouth every 8 (eight) hours as needed.   amLODipine 2.5 MG tablet Commonly known as: NORVASC Take 1 tablet (2.5 mg total) by mouth daily.   betamethasone dipropionate 0.05 % ointment Commonly known as: DIPROLENE Apply topically 2 (two) times daily.   carvedilol 6.25 MG tablet Commonly known as: COREG TAKE ONE TABLET TWICE DAILY WITH A MEAL   Eliquis 5 MG Tabs tablet Generic drug: apixaban TAKE ONE TABLET TWICE DAILY    levETIRAcetam 500 MG 24 hr tablet Commonly known as: KEPPRA XR Take 1 tablet (500 mg total) by mouth daily.   loratadine 10 MG tablet Commonly known as: CLARITIN Take by mouth.   lovastatin 40 MG tablet Commonly known as: MEVACOR TAKE TWO TABLETS EACH NIGHT AT BEDTIME   Pfizer-BioNTech COVID-19 Vacc 30 MCG/0.3ML injection Generic drug: COVID-19 mRNA vaccine (Pfizer) USE AS DIRECTED   PreserVision AREDS Caps Take 1 capsule by mouth 2 (two) times daily.   UNABLE TO FIND Bevacizumab(AVASTIN) chemo injection for the eye every 10 weeks       Allergies: No Known Allergies  Family History: Family History  Problem Relation Age of Onset  . Heart disease Father 37       Died suddenly.  No diagnosis  . Stroke Sister   . Dementia Mother   . Diabetes Mother   . Heart disease Mother   . Hypertension Mother   . Cancer Daughter        ovarian (?) vs other male cancer; s/p hyst doing well  . Diabetes Daughter        GDM, and now AODM  . Heart disease Son        congestive heart failure--improved  . Congestive Heart Failure Grandchild     Social History:  reports that he quit smoking about 40 years ago. His smoking use included cigarettes. He has a 20.00 pack-year smoking history. He has never used smokeless tobacco. He reports current alcohol use of about 14.0 standard drinks of alcohol per week. He reports that he does not use drugs.   Physical Exam: There were no vitals taken for this visit.  Constitutional:  Alert and oriented, No acute distress. HEENT: [place] AT, moist mucus membranes.  Trachea midline, no masses. Cardiovascular: No clubbing, cyanosis, or edema. Respiratory: Normal respiratory effort, no increased work of breathing. Skin: No rashes, bruises or suspicious lesions. Neurologic: Grossly intact, no focal deficits, moving all 4 extremities. Psychiatric: Normal mood and affect.   Assessment & Plan:    1.  Nocturia  Most bothersome voiding symptoms  We  discussed multiple potential etiologies of nocturia including overactive bladder, nocturnal polyuria and obstructive sleep apnea  Trial Myrbetriq 25 mg daily  1 month follow-up for recheck symptoms  2.  Urinary frequency  Bladder scan PVR 0 mL  3.  History prostate cancer  4.  History urethral stricture  No change in symptoms since 2020.  Declined cystoscopy at this time.   Abbie Sons, [place] [address] [place], [place] [address] [phone]

## 2020-11-06 ENCOUNTER — Ambulatory Visit: Payer: Medicare Other | Admitting: Podiatry

## 2020-11-06 ENCOUNTER — Other Ambulatory Visit: Payer: Self-pay

## 2020-11-06 ENCOUNTER — Encounter: Payer: Self-pay | Admitting: Urology

## 2020-11-06 ENCOUNTER — Ambulatory Visit (INDEPENDENT_AMBULATORY_CARE_PROVIDER_SITE_OTHER): Payer: Medicare Other | Admitting: Urology

## 2020-11-06 VITALS — BP 103/64 | HR 144 | Ht 71.0 in | Wt 141.0 lb

## 2020-11-06 DIAGNOSIS — R350 Frequency of micturition: Secondary | ICD-10-CM

## 2020-11-06 DIAGNOSIS — R351 Nocturia: Secondary | ICD-10-CM | POA: Diagnosis not present

## 2020-11-06 DIAGNOSIS — N3943 Post-void dribbling: Secondary | ICD-10-CM | POA: Diagnosis not present

## 2020-11-06 MED ORDER — SILODOSIN 8 MG PO CAPS: 8.0000 mg | ORAL_CAPSULE | Freq: Every day | ORAL | 0 refills | Status: DC

## 2020-11-06 NOTE — Progress Notes (Signed)
11/06/2020 1:21 PM   SHUBHAM THACKSTON September 11, 1932 [id number]  Referring provider: Rita Ohara, [place],  [place] [address]  Chief Complaint  Patient presents with   Other    HPI: 87 y.o. male presents for 1 month follow-up.  Refer to prior note 10/01/2020 Given a trial of Myrbetriq and presents for follow-up He feels with the Myrbetriq his urinary frequency was last however he developed postvoid dribbling Has been off the samples for 1 week but still having postvoid dribbling Nocturia was slightly improved   PMH: Past Medical History:  Diagnosis Date   Acute CVA (cerebrovascular accident) ([place])    Acute encephalopathy 11/27/2014   Aortic atherosclerosis ([place]) 11/25/2014   Atrial fibrillation ([place])    a. 12/2018 Zio: Sinus rhythm, avg rate 71. 2 runs of VT (max 17 beats). 27 runs SVT (max 14 beats). Occas PACs/PVCs. No high grade AV block/pauses.   Chronic combined systolic (congestive) and diastolic (congestive) heart failure ([place])    a. 10/2014 Echo: EF 40-45%; b. 03/2018 Echo: EF 55-60%; c. 12/2018 Echo: EF 55-60%, mild conc LVH. Nl RV fxn. RVSP 51.53mmHg. Mildly dil LA. Mod AS w/ severe Ca2+; d. 12/2019 Echo: EF 50-55%, mild LVH, gr2 DD. Sev elev PASP. Mild MR, mild to mod TR. Triv AI, mod to sev AS (area 0.77cm^2, mean grad 69mmHg. Vmax 2.67m/s).   Diabetes mellitus    Hypercholesteremia    Hypertension    Impaired fasting glucose    Macular degeneration    Moderate to severe aortic stenosis    a. 12/2018 Echo: EF 55-60%, Mod AS w/ sev AoV Ca2+; b. 12/2019 Echo: EF 50-55%. Mod to sev AS (area 0.77cm^2, mean grad 17mmHg. Vmax 2.35m/s).   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 Hilarie Fredrickson)   RMSF [facility] spotted fever) 10/2014   (hosp with FUO, encephalopathy)   Rotator cuff  tear, right 10/2010   supraspinatous and infraspinatous   Seizure disorder ([place]) 07/25/2019   TIA (transient ischemic attack) 10/02/13   hosp [facility] x 1 night   Type 2 diabetes mellitus with microalbuminuria or microproteinuria 01/15/2014    Surgical History: Past Surgical History:  Procedure Laterality Date   CATARACT EXTRACTION Right 01/2017   CIRCUMCISION  01/2017   [name]   COLONOSCOPY N/A 11/28/2012   Procedure: COLONOSCOPY;  Surgeon: Jerene Bears, MD;  Location: [facility];  Service: Gastroenterology;  Laterality: N/A;   INTERNAL URETHROTOMY  01/27/2016   WF   prostate seed implant     ROTATOR CUFF REPAIR  06/16/2011   right (Dr. Berenice Primas)   TONSILLECTOMY AND ADENOIDECTOMY  age 58    Home Medications:  Allergies as of 11/06/2020   No Known Allergies      Medication List        Accurate as of November 06, 2020  1:21 PM. If you have any questions, ask your nurse or doctor.          acetaminophen 650 MG CR tablet Commonly known as: Tylenol Arthritis Pain Take 1 tablet (650 mg total) by mouth every 8 (eight) hours as needed.   amLODipine 2.5 MG tablet Commonly known as: NORVASC Take 1 tablet (2.5 mg total) by mouth daily.   betamethasone dipropionate 0.05 % ointment Commonly known as: DIPROLENE Apply topically 2 (two) times daily.  carvedilol 6.25 MG tablet Commonly known as: COREG TAKE ONE TABLET TWICE DAILY WITH A MEAL   Eliquis 5 MG Tabs tablet Generic drug: apixaban TAKE ONE TABLET TWICE DAILY   levETIRAcetam 500 MG 24 hr tablet Commonly known as: KEPPRA XR Take 1 tablet (500 mg total) by mouth daily.   loratadine 10 MG tablet Commonly known as: CLARITIN Take by mouth.   lovastatin 40 MG tablet Commonly known as: MEVACOR TAKE TWO TABLETS EACH NIGHT AT BEDTIME   Pfizer-BioNTech COVID-19 Vacc 30 MCG/0.3ML injection Generic drug: COVID-19 mRNA vaccine (Pfizer) USE AS DIRECTED   PreserVision AREDS Caps Take 1 capsule by mouth 2 (two) times daily.    UNABLE TO FIND Bevacizumab(AVASTIN) chemo injection for the eye every 10 weeks        Allergies: No Known Allergies  Family History: Family History  Problem Relation Age of Onset   Heart disease Father 2       Died suddenly.  No diagnosis   Stroke Sister    Dementia Mother    Diabetes Mother    Heart disease Mother    Hypertension Mother    Cancer Daughter        ovarian (?) vs other male cancer; s/p hyst doing well   Diabetes Daughter        GDM, and now AODM   Heart disease Son        congestive heart failure--improved   Congestive Heart Failure Grandchild     Social History:  reports that he quit smoking about 40 years ago. His smoking use included cigarettes. He has a 20.00 pack-year smoking history. He has never used smokeless tobacco. He reports current alcohol use of about 14.0 standard drinks of alcohol per week. He reports that he does not use drugs.   Physical Exam: There were no vitals taken for this visit.  Constitutional:  Alert and oriented, No acute distress. HEENT: [place] AT, moist mucus membranes.  Trachea midline, no masses. Cardiovascular: No clubbing, cyanosis, or edema. Respiratory: Normal respiratory effort, no increased work of breathing.   Assessment & Plan:    1.  Lower urinary tract symptoms Symptoms include urinary frequency, postvoid dribbling and nocturia No significant improvement with Myrbetriq Trial silodosin 8 mg daily PA follow-up 1 month for bladder scan and symptom reassessment    Abbie Sons, MD  [facility] [address], [place], [place] [address] [phone]

## 2020-11-08 ENCOUNTER — Other Ambulatory Visit: Payer: Self-pay | Admitting: Physician Assistant

## 2020-11-08 ENCOUNTER — Other Ambulatory Visit: Payer: Self-pay | Admitting: Cardiovascular Disease

## 2020-11-10 ENCOUNTER — Telehealth: Payer: Self-pay | Admitting: Family Medicine

## 2020-11-10 NOTE — Telephone Encounter (Signed)
Patient notified Silodosin has been approved Approval date: 10/08/2020-11/07/2021

## 2020-11-10 NOTE — Telephone Encounter (Signed)
Refill Request.  

## 2020-11-10 NOTE — Telephone Encounter (Signed)
42m, 64kg, scr 0.97 09/08/20, lovw/arida 08/12/20

## 2020-11-11 ENCOUNTER — Other Ambulatory Visit: Payer: Self-pay | Admitting: Urology

## 2020-11-12 ENCOUNTER — Emergency Department: Payer: Medicare Other

## 2020-11-12 ENCOUNTER — Other Ambulatory Visit: Payer: Self-pay

## 2020-11-12 ENCOUNTER — Emergency Department
Admission: EM | Admit: 2020-11-12 | Discharge: 2020-11-12 | Disposition: A | Discharge status: Home / Self Care | Payer: Medicare Other | Attending: Emergency Medicine | Admitting: Emergency Medicine

## 2020-11-12 DIAGNOSIS — I4891 Unspecified atrial fibrillation: Secondary | ICD-10-CM | POA: Insufficient documentation

## 2020-11-12 DIAGNOSIS — I5043 Acute on chronic combined systolic (congestive) and diastolic (congestive) heart failure: Secondary | ICD-10-CM | POA: Insufficient documentation

## 2020-11-12 DIAGNOSIS — Z87891 Personal history of nicotine dependence: Secondary | ICD-10-CM | POA: Insufficient documentation

## 2020-11-12 DIAGNOSIS — Z79899 Other long term (current) drug therapy: Secondary | ICD-10-CM | POA: Diagnosis not present

## 2020-11-12 DIAGNOSIS — Z8546 Personal history of malignant neoplasm of prostate: Secondary | ICD-10-CM | POA: Diagnosis not present

## 2020-11-12 DIAGNOSIS — T679XXA Effect of heat and light, unspecified, initial encounter: Secondary | ICD-10-CM | POA: Diagnosis not present

## 2020-11-12 DIAGNOSIS — Z7901 Long term (current) use of anticoagulants: Secondary | ICD-10-CM | POA: Diagnosis not present

## 2020-11-12 DIAGNOSIS — I110 Hypertensive heart disease with heart failure: Secondary | ICD-10-CM | POA: Insufficient documentation

## 2020-11-12 DIAGNOSIS — I959 Hypotension, unspecified: Secondary | ICD-10-CM | POA: Diagnosis not present

## 2020-11-12 DIAGNOSIS — R55 Syncope and collapse: Secondary | ICD-10-CM | POA: Diagnosis not present

## 2020-11-12 DIAGNOSIS — G319 Degenerative disease of nervous system, unspecified: Secondary | ICD-10-CM | POA: Diagnosis not present

## 2020-11-12 DIAGNOSIS — I6782 Cerebral ischemia: Secondary | ICD-10-CM | POA: Diagnosis not present

## 2020-11-12 DIAGNOSIS — E1129 Type 2 diabetes mellitus with other diabetic kidney complication: Secondary | ICD-10-CM | POA: Diagnosis not present

## 2020-11-12 DIAGNOSIS — E1159 Type 2 diabetes mellitus with other circulatory complications: Secondary | ICD-10-CM | POA: Diagnosis not present

## 2020-11-12 LAB — CBC WITH DIFFERENTIAL/PLATELET
Abs Immature Granulocytes: 0.01 10*3/uL (ref 0.00–0.07)
Basophils Absolute: 0 10*3/uL (ref 0.0–0.1)
Basophils Relative: 1 %
Eosinophils Absolute: 0.1 10*3/uL (ref 0.0–0.5)
Eosinophils Relative: 1 %
HCT: 36 % — ABNORMAL LOW (ref 39.0–52.0)
Hemoglobin: 11.6 g/dL — ABNORMAL LOW (ref 13.0–17.0)
Immature Granulocytes: 0 %
Lymphocytes Relative: 19 %
Lymphs Abs: 1.2 10*3/uL (ref 0.7–4.0)
MCH: 32.8 pg (ref 26.0–34.0)
MCHC: 32.2 g/dL (ref 30.0–36.0)
MCV: 101.7 fL — ABNORMAL HIGH (ref 80.0–100.0)
Monocytes Absolute: 0.6 10*3/uL (ref 0.1–1.0)
Monocytes Relative: 10 %
Neutro Abs: 4.5 10*3/uL (ref 1.7–7.7)
Neutrophils Relative %: 69 %
Platelets: 231 10*3/uL (ref 150–400)
RBC: 3.54 MIL/uL — ABNORMAL LOW (ref 4.22–5.81)
RDW: 12.5 % (ref 11.5–15.5)
WBC: 6.4 10*3/uL (ref 4.0–10.5)
nRBC: 0 % (ref 0.0–0.2)

## 2020-11-12 LAB — BASIC METABOLIC PANEL
Anion gap: 10 (ref 5–15)
BUN: 20 mg/dL (ref 8–23)
CO2: 28 mmol/L (ref 22–32)
Calcium: 8.8 mg/dL — ABNORMAL LOW (ref 8.9–10.3)
Chloride: 102 mmol/L (ref 98–111)
Creatinine, Ser: 1.08 mg/dL (ref 0.61–1.24)
GFR, Estimated: >60 mL/min (ref >60)
Glucose, Bld: 156 mg/dL — ABNORMAL HIGH (ref 70–99)
Potassium: 4.6 mmol/L (ref 3.5–5.1)
Sodium: 140 mmol/L (ref 135–145)

## 2020-11-12 LAB — TROPONIN I (HIGH SENSITIVITY): Troponin I (High Sensitivity): 10 ng/L (ref <18)

## 2020-11-12 NOTE — ED Notes (Signed)
Pt resting in bed, alert and oriented and in NAD at this time. Pt denies falling or hitting head. Pt does not remember becoming unresponsive today.

## 2020-11-12 NOTE — ED Provider Notes (Signed)
[facility] Emergency Department Provider Note  ___________________________________________   None    (approximate)  I have reviewed the triage vital signs and the nursing notes.   HISTORY  Chief Complaint Loss of Consciousness  HPI Jeffrey [name] is a 87 y.o. male presents to the ED from home, via EMS for a witnessed syncopal/near syncopal episode.  Patient according to the adult daughter who is now at bedside, has had several episodes in the past of near syncope, that they usually relate to overheating and exertion.  Does note however, today that he took his first initial dose of silodosin, for his BPH.  He then proceeded to go outside and cut the grass on his riding lawnmower.  His wife witnessed the patient get off the lawnmower and become dazed and unresponsive, with his eyes open.  There was no reported fall at any point.  The wife called out to EMS, who evaluated the patient, who was by that time alert and talkative.  The patient initially refused transport to the hospital, but when the EMS team attempted to walk the patient back towards the house, he again became dazed and unresponsive, but still standing and without falling.  Patient presents now ANO x4, with no complaints of any pain or discomfort.  He denies any chest pain, shortness of breath, nausea, vomiting, or dizziness.  He received the final milliliter bolus of IV fluids in the ED, and blood pressures are normalized at this time.  Past Medical History:  Diagnosis Date   Acute CVA (cerebrovascular accident) ([place])    Acute encephalopathy 11/27/2014   Aortic atherosclerosis ([place]) 11/25/2014   Atrial fibrillation ([place])    a. 12/2018 Zio: Sinus rhythm, avg rate 71. 2 runs of VT (max 17 beats). 27 runs SVT (max 14 beats). Occas PACs/PVCs. No high grade AV block/pauses.   Chronic combined systolic (congestive) and diastolic (congestive) heart failure ([place])    a. 10/2014 Echo: EF 40-45%; b. 03/2018 Echo: EF  55-60%; c. 12/2018 Echo: EF 55-60%, mild conc LVH. Nl RV fxn. RVSP 51.62mmHg. Mildly dil LA. Mod AS w/ severe Ca2+; d. 12/2019 Echo: EF 50-55%, mild LVH, gr2 DD. Sev elev PASP. Mild MR, mild to mod TR. Triv AI, mod to sev AS (area 0.77cm^2, mean grad 30mmHg. Vmax 2.74m/s).   Diabetes mellitus    Hypercholesteremia    Hypertension    Impaired fasting glucose    Macular degeneration    Moderate to severe aortic stenosis    a. 12/2018 Echo: EF 55-60%, Mod AS w/ sev AoV Ca2+; b. 12/2019 Echo: EF 50-55%. Mod to sev AS (area 0.77cm^2, mean grad 80mmHg. Vmax 2.17m/s).   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 01/15/2014    Patient Active Problem List   Diagnosis Date Noted   Nocturia 10/01/2020   Urinary frequency 10/01/2020   History of prostate cancer 10/01/2020   History of urethral stricture 10/01/2020   Pain due to onychomycosis of toenails of both feet 07/07/2020  Hematoma 07/07/2020   Seizure disorder ([place]) 07/25/2019   Obstructive sleep apnea 06/27/2018   Pulmonary hypertension ([place]) 06/27/2018   Long term current use of anticoagulant 05/28/2017   Type 2 diabetes mellitus with microalbuminuria, without long-term current use of insulin ([place]) 05/28/2017   H/O [facility] spotted fever 07/30/2015   Aortic valve stenosis 04/11/2015   Debility 02/04/2015   Cardiomyopathy ([place]) 01/09/2015   Paroxysmal atrial fibrillation ([place]) 12/24/2014   Stroke with cerebral ischemia ([place]) 12/03/2014    Pre-diabetes    Acute on chronic combined systolic and diastolic CHF (congestive heart failure) (HCC)    Acute CVA (cerebrovascular accident) ([place])    Left carotid artery stenosis 11/30/2014   Lacunar infarct, acute ([place])    Aortic atherosclerosis ([place]) 11/25/2014   DM (diabetes mellitus), type 2 with peripheral vascular complications ([place]) 16/96/7893   Awareness alteration, transient 12/26/2013   Low serum testosterone level 12/25/2013   TIA (transient ischemic attack) 10/10/2013   Radiation proctitis 11/28/2012   Colon cancer screening 11/28/2012   Murmur 02/23/2011   Type 2 diabetes mellitus with hypercholesterolemia ([place]) 02/05/2011   Essential hypertension, benign 11/26/2010   Impaired fasting glucose 11/26/2010   Pure hypercholesterolemia 11/26/2010   Prostate cancer ([place]) 11/26/2010    Past Surgical History:  Procedure Laterality Date   CATARACT EXTRACTION Right 01/2017   CIRCUMCISION  01/2017   [name]   COLONOSCOPY N/A 11/28/2012   Procedure: COLONOSCOPY;  Surgeon: Jerene Bears, MD;  Location[facility];  Service: Gastroenterology;  Laterality: N/A;   INTERNAL URETHROTOMY  01/27/2016   WF   prostate seed implant     ROTATOR CUFF REPAIR  06/16/2011   right (Dr. Berenice Primas)   [place]  age 11    Prior to Admission medications   Medication Sig Start Date End Date Taking? Authorizing Provider  acetaminophen (TYLENOL ARTHRITIS PAIN) 650 MG CR tablet Take 1 tablet (650 mg total) by mouth every 8 (eight) hours as needed. 12/10/14   Love, Ivan Anchors, PA-C  amLODipine (NORVASC) 2.5 MG tablet Take 1 tablet (2.5 mg total) by mouth daily. 08/12/20 11/10/20  Wellington Hampshire, MD  betamethasone dipropionate (DIPROLENE) 0.05 % ointment Apply topically 2 (two) times daily.    [provider]  carvedilol (COREG) 6.25 MG tablet TAKE ONE TABLET TWICE DAILY WITH A MEAL 11/10/20   Dunn, Areta Haber, PA-C  COVID-19 mRNA vaccine, Pfizer, 30 MCG/0.3ML injection USE AS  DIRECTED 03/07/20 03/07/21  Carlyle Basques, MD  ELIQUIS 5 MG TABS tablet TAKE ONE TABLET TWICE DAILY 11/10/20   Wellington Hampshire, MD  levETIRAcetam (KEPPRA XR) 500 MG 24 hr tablet Take 1 tablet (500 mg total) by mouth daily. 09/03/20   Frann Rider, NP  loratadine (CLARITIN) 10 MG tablet Take by mouth.     [provider]  lovastatin (MEVACOR) 40 MG tablet TAKE TWO TABLETS EACH NIGHT AT BEDTIME 09/09/20   Rita Ohara, MD  Multiple Vitamins-Minerals (PRESERVISION AREDS) CAPS Take 1 capsule by mouth 2 (two) times daily.     [provider]  silodosin (RAPAFLO) 8 MG CAPS capsule Take 1 capsule (8 mg total) by mouth daily. 11/06/20   Stoioff, Ronda Fairly, MD  UNABLE TO FIND Bevacizumab(AVASTIN) chemo injection for the eye every 10 weeks    [provider]    Allergies Patient has no known allergies.  Family History  Problem Relation Age of Onset   Heart disease Father 74       Died suddenly.  No diagnosis   Stroke Sister    Dementia Mother    Diabetes Mother    Heart disease Mother    Hypertension Mother    Cancer Daughter        ovarian (?) vs other male cancer; s/p hyst doing well   Diabetes Daughter        GDM, and now AODM   Heart disease Son        congestive heart failure--improved   Congestive Heart Failure Grandchild     Social History Social History   Tobacco Use   Smoking status: Former    Packs/day: 1.00    Years: 20.00    Pack years: 20.00    Types: Cigarettes    Quit date: 05/31/1980    Years since quitting: 40.4   Smokeless tobacco: Never  Vaping Use   Vaping Use: Never used  Substance Use Topics   Alcohol use: Yes    Alcohol/week: 14.0 standard drinks    Types: 14 Cans of beer per week    Comment: 2 cans of beer most days of the week   Drug use: No    Review of Systems  Constitutional: No fever/chills Eyes: No visual changes. ENT: No sore throat. Cardiovascular: Denies chest pain. Respiratory: Denies shortness of  breath. Gastrointestinal: No abdominal pain.  No nausea, no vomiting.  No diarrhea.  No constipation. Genitourinary: Negative for dysuria. Musculoskeletal: Negative for back pain. Skin: Negative for rash. Neurological: Negative for headaches, focal weakness or numbness. Witnessed syncope ____________________________________________   PHYSICAL EXAM:  VITAL SIGNS: ED Triage Vitals  Enc Vitals Group     BP 11/12/20 1137 (!) 157/74     Pulse Rate 11/12/20 1137 72     Resp 11/12/20 1137 18     Temp 11/12/20 1137 97.9 F (36.6 C)     Temp Source 11/12/20 1137 Oral     SpO2 11/12/20 1137 99 %     Weight 11/12/20 1138 141 lb 1.5 oz (64 kg)     Height 11/12/20 1138 5\' 11"  (1.803 m)     Head Circumference --      Peak Flow --      Pain Score 11/12/20 1138 0     Pain Loc --      Pain Edu? --      Excl. in [place]? --    Constitutional: Alert and oriented. Well appearing and in no acute distress. Eyes: Conjunctivae are normal. PERRL. EOMI. Head: Atraumatic. Nose: No congestion/rhinnorhea. Mouth/Throat: Mucous membranes are moist.  Oropharynx non-erythematous. Neck: No stridor.   Cardiovascular: Normal rate, regular rhythm. Grossly normal heart sounds.  Good peripheral circulation. Respiratory: Normal respiratory effort.  No retractions. Lungs CTAB. Gastrointestinal: Soft and nontender. No distention. No abdominal bruits. No CVA tenderness. Musculoskeletal: No lower extremity tenderness nor edema.  No joint effusions. Neurologic:  Normal speech and language. No gross focal neurologic deficits are appreciated. No gait instability. Skin:  Skin is warm, dry and intact. No rash noted. Psychiatric: Mood and affect are normal. Speech and behavior are normal.  ____________________________________________   LABS (all labs ordered are listed, but only abnormal results are displayed)  Labs Reviewed  BASIC METABOLIC PANEL - Abnormal; Notable for the following components:      Result Value    Glucose, Bld 156 (*)    Calcium 8.8 (*)    All other components within normal limits  CBC WITH DIFFERENTIAL/PLATELET - Abnormal; Notable for the following components:   RBC 3.54 (*)  Hemoglobin 11.6 (*)    HCT 36.0 (*)    MCV 101.7 (*)    All other components within normal limits  TROPONIN I (HIGH SENSITIVITY)   ____________________________________________  EKG  Vent. rate 68 BPM PR interval 184 ms QRS duration 146 ms QT/QTcB 480/511 ms P-R-T axes 52 257 29 ____________________________________________  RADIOLOGY I, Melvenia Needles, personally viewed and evaluated these images (plain radiographs) as part of my medical decision making, as well as reviewing the written report by the radiologist.  ED MD interpretation:  agree with report  Official radiology report(s): No results found.  CT Head w/o CM  IMPRESSION: Chronic atrophic and ischemic changes similar to that noted on prior exam. No acute abnormality seen. ____________________________________________   PROCEDURES  Procedure(s) performed (including Critical Care):  Procedures   ____________________________________________   INITIAL IMPRESSION / ASSESSMENT AND PLAN / ED COURSE  As part of my medical decision making, I reviewed the following data within the electronic MEDICAL RECORD NUMBER History obtained from family, Labs reviewed WNL, EKG interpreted NSR, Radiograph reviewed WNL, Evaluated by EM attending P.Stafford, and Notes from prior ED visits    Differential diagnosis includes, but is not limited to, alcohol, illicit or prescription medications, or other toxic ingestion; intracranial pathology such as stroke or intracerebral hemorrhage; fever or infectious causes including sepsis; hypoxemia and/or hypercarbia; uremia; trauma; endocrine related disorders such as diabetes, hypoglycemia, and thyroid-related diseases; hypertensive encephalopathy; etc.  Geriatric patient ED evaluation of a witnessed  syncopal episode while outside mowing the lawn this morning.  Patient presents for evaluation and is in stable condition at this time without any complaints.  Patient's blood pressure is normalized at this time, the patients, EKG, CT and labs are all reassuring.  Patient has a history of near syncopal episodes with no known etiology in the past.  He is currently followed by cardiology.  Patient did start an initial dose of silodosin, for his BPH.  That initial dose was noted today, and singly enough, the initial dose is known to cause some hypotension, dizziness, and syncope.  Patient is at this time stable for discharge home.  He may continue with the medications as prescribed, discursive follow-up with his primary provider and his cardiologist.  Return precautions have been discussed. ____________________________________________   FINAL CLINICAL IMPRESSION(S) / ED DIAGNOSES  Final diagnoses:  Syncope, unspecified syncope type     ED Discharge Orders     None        Note:  This document was prepared using Dragon voice recognition software and may include unintentional dictation errors. st   Melvenia Needles, PA-C 11/13/20 1426    Carrie Mew, MD 11/13/20 1458

## 2020-11-12 NOTE — ED Triage Notes (Signed)
Pt from home ems reports pt mowing yard and became diaphoretic and unresponsive with eye open. Pt wife threw water on pt to get him awake and cooled off. Pt became alert and then had second episode with ems when standing to walk. Pt now a&o x 4 on arrival. PA present to assess pt. NAD noted at this time

## 2020-11-12 NOTE — Discharge Instructions (Addendum)
Your exam, labs, EKG, and CT scan are all normal and reassuring at this time. Consider taking your new medicine (silodosin) at bedtime. Continue to hydrate and limit outside activities to the early morning or late evenings. Follow-up with your Cardiologist and/or Urologist as discussed.

## 2020-11-27 DIAGNOSIS — H353231 Exudative age-related macular degeneration, bilateral, with active choroidal neovascularization: Secondary | ICD-10-CM | POA: Diagnosis not present

## 2020-11-27 DIAGNOSIS — H43813 Vitreous degeneration, bilateral: Secondary | ICD-10-CM | POA: Diagnosis not present

## 2020-12-08 ENCOUNTER — Ambulatory Visit: Payer: Self-pay | Admitting: Physician Assistant

## 2020-12-15 ENCOUNTER — Ambulatory Visit (INDEPENDENT_AMBULATORY_CARE_PROVIDER_SITE_OTHER): Payer: Medicare Other | Admitting: Physician Assistant

## 2020-12-15 ENCOUNTER — Encounter: Payer: Self-pay | Admitting: Physician Assistant

## 2020-12-15 ENCOUNTER — Other Ambulatory Visit: Payer: Self-pay

## 2020-12-15 VITALS — BP 110/65 | HR 75 | Ht 71.0 in | Wt 160.0 lb

## 2020-12-15 DIAGNOSIS — R350 Frequency of micturition: Secondary | ICD-10-CM

## 2020-12-15 LAB — BLADDER SCAN AMB NON-IMAGING: Scan Result: 18

## 2020-12-15 MED ORDER — MIRABEGRON ER 25 MG PO TB24: 25.0000 mg | ORAL_TABLET | Freq: Every day | ORAL | 0 refills | Status: DC

## 2020-12-15 NOTE — Progress Notes (Signed)
12/15/2020 2:31 PM   Jeffrey [name] 1933/04/30 [id number]  CC: Chief Complaint  Patient presents with   Urinary Frequency    HPI: Jeffrey [name] is a 87 y.o. male with PMH prostate cancer s/p IMRT and brachytherapy, urethral stricture s/p internal urethrotomy and circumcision in September 2018, OSA, and LUTS including daytime frequency, nocturia, and postvoid dribbling who presents today for symptom recheck on silodosin.  He previously noted minimal improvement on Myrbetriq.  Today he reports silodosin made him feel "nervous and staggery."  He stopped silodosin upon noticing this side effect and subsequently restarted it, and notes that the symptoms resumed at that time.  He has since stopped the medication again.  Today he reports his most bothersome urinary symptoms include "false alarms" overnight, when he has the sense of urgency but voids very little.  He states this occurs 3-5 times nightly.  Here also reports both urge incontinence and postvoid dribbling.  He has a history of OSA previously on CPAP, but states he stopped using it because he felt it was too burdensome.  Patient reports he did note some symptomatic improvement on Myrbetriq previously, and states he was only on this medication for 1 week.  PVR 70mL.  PMH: Past Medical History:  Diagnosis Date   Acute CVA (cerebrovascular accident) ([place])    Acute encephalopathy 11/27/2014   Aortic atherosclerosis ([place]) 11/25/2014   Atrial fibrillation ([place])    a. 12/2018 Zio: Sinus rhythm, avg rate 71. 2 runs of VT (max 17 beats). 27 runs SVT (max 14 beats). Occas PACs/PVCs. No high grade AV block/pauses.   Chronic combined systolic (congestive) and diastolic (congestive) heart failure ([place])    a. 10/2014 Echo: EF 40-45%; b. 03/2018 Echo: EF 55-60%; c. 12/2018 Echo: EF 55-60%, mild conc LVH. Nl RV fxn. RVSP 51.81mmHg. Mildly dil LA. Mod AS w/ severe Ca2+; d. 12/2019 Echo: EF 50-55%, mild LVH, gr2 DD. Sev elev PASP. Mild MR, mild to mod  TR. Triv AI, mod to sev AS (area 0.77cm^2, mean grad 79mmHg. Vmax 2.9m/s).   Diabetes mellitus    Hypercholesteremia    Hypertension    Impaired fasting glucose    Macular degeneration    Moderate to severe aortic stenosis    a. 12/2018 Echo: EF 55-60%, Mod AS w/ sev AoV Ca2+; b. 12/2019 Echo: EF 50-55%. Mod to sev AS (area 0.77cm^2, mean grad 62mmHg. Vmax 2.79m/s).   Multiple lacunar infarcts (HCC)    Obstructive sleep apnea 06/27/2018   Prostate cancer ([place]) radiation + seeding implant (Dr.[name])   Radiation proctitis 11/2012   treated with APC ablation and Canasa suppositories (Dr. Hilarie Fredrickson)   R[facility] spotted fever) 10/2014   (hosp with FUO, encephalopathy)   Rotator cuff tear, right 10/2010   supraspinatous and infraspinatous   Seizure disorder ([place]) 07/25/2019   TIA (transient ischemic attack) 10/02/13   hosp at [facility] x 1 night   Type 2 diabetes mellitus with microalbuminuria or microproteinuria 01/15/2014    Surgical History: Past Surgical History:  Procedure Laterality Date   CATARACT EXTRACTION Right 01/2017   CIRCUMCISION  01/2017   [name]   COLONOSCOPY N/A 11/28/2012   Procedure: COLONOSCOPY;  Surgeon: Jerene Bears, MD;  Location: [facility];  Service: Gastroenterology;  Laterality: N/A;   INTERNAL URETHROTOMY  01/27/2016   WF   prostate seed implant     ROTATOR CUFF REPAIR  06/16/2011   right (Dr. Berenice Primas)   TONSILLECTOMY AND ADENOIDECTOMY  age 89  Home Medications:  Allergies as of 12/15/2020   No Known Allergies      Medication List        Accurate as of December 15, 2020  2:31 PM. If you have any questions, ask your nurse or doctor.          acetaminophen 650 MG CR tablet Commonly known as: Tylenol Arthritis Pain Take 1 tablet (650 mg total) by mouth every 8 (eight) hours as needed.   amLODipine 2.5 MG tablet Commonly known as: NORVASC Take 1 tablet (2.5 mg total) by mouth daily.   betamethasone dipropionate 0.05 % ointment Commonly known  as: DIPROLENE Apply topically 2 (two) times daily.   carvedilol 6.25 MG tablet Commonly known as: COREG TAKE ONE TABLET TWICE DAILY WITH A MEAL   Eliquis 5 MG Tabs tablet Generic drug: apixaban TAKE ONE TABLET TWICE DAILY   levETIRAcetam 500 MG 24 hr tablet Commonly known as: KEPPRA XR Take 1 tablet (500 mg total) by mouth daily.   loratadine 10 MG tablet Commonly known as: CLARITIN Take by mouth.   lovastatin 40 MG tablet Commonly known as: MEVACOR TAKE TWO TABLETS EACH NIGHT AT BEDTIME   Pfizer-BioNTech COVID-19 Vacc 30 MCG/0.3ML injection Generic drug: COVID-19 mRNA vaccine (Pfizer) USE AS DIRECTED   PreserVision AREDS Caps Take 1 capsule by mouth 2 (two) times daily.   silodosin 8 MG Caps capsule Commonly known as: RAPAFLO Take 1 capsule (8 mg total) by mouth daily.   UNABLE TO FIND Bevacizumab(AVASTIN) chemo injection for the eye every 10 weeks        Allergies:  No Known Allergies  Family History: Family History  Problem Relation Age of Onset   Heart disease Father 62       Died suddenly.  No diagnosis   Stroke Sister    Dementia Mother    Diabetes Mother    Heart disease Mother    Hypertension Mother    Cancer Daughter        ovarian (?) vs other male cancer; s/p hyst doing well   Diabetes Daughter        GDM, and now AODM   Heart disease Son        congestive heart failure--improved   Congestive Heart Failure Grandchild     Social History:   reports that he quit smoking about 40 years ago. His smoking use included cigarettes. He has a 20.00 pack-year smoking history. He has never used smokeless tobacco. He reports current alcohol use of about 14.0 standard drinks of alcohol per week. He reports that he does not use drugs.  Physical Exam: BP 110/65   Pulse 75   Ht 5\' 11"  (1.803 m)   Wt 160 lb (72.6 kg)   BMI 22.32 kg/m   Constitutional:  Alert and oriented, no acute distress, nontoxic appearing HEENT: [place], AT Cardiovascular: No  clubbing, cyanosis, or edema Respiratory: Normal respiratory effort, no increased work of breathing Skin: No rashes, bruises or suspicious lesions Neurologic: Grossly intact, no focal deficits, moving all 4 extremities Psychiatric: Normal mood and affect  Laboratory Data: Results for orders placed or performed in visit on 12/15/20  Bladder Scan (Post Void Residual) in office  Result Value Ref Range   Scan Result 18    Assessment & Plan:   1. Urinary frequency Stop silodosin secondary to side effects.  As this is the most selective alpha-blocker, I do not recommend a trial of an alternative agent from this class.  Given patient reports  of some slight symptomatic improvement on 1 week of Myrbetriq, I encouraged him to proceed with a 1 month trial of this medication, and he agreed.  Prescription sent to his pharmacy today.  If no significant improvement after 1 month of therapy, recommend consideration of Gemtesa as an alternative. - Bladder Scan (Post Void Residual) in office - mirabegron ER (MYRBETRIQ) 25 MG TB24 tablet; Take 1 tablet (25 mg total) by mouth daily.  Dispense: 30 tablet; Refill: 0   Return in about 4 weeks (around 01/12/2021) for Symptom recheck with PVR.  Debroah Loop, PA-C  [facility] [address] [place], [place] [address] [phone]

## 2020-12-29 ENCOUNTER — Other Ambulatory Visit: Payer: Self-pay

## 2020-12-29 ENCOUNTER — Encounter: Payer: Self-pay | Admitting: Podiatry

## 2020-12-29 ENCOUNTER — Ambulatory Visit (INDEPENDENT_AMBULATORY_CARE_PROVIDER_SITE_OTHER): Payer: Medicare Other | Admitting: Podiatry

## 2020-12-29 DIAGNOSIS — M79675 Pain in left toe(s): Secondary | ICD-10-CM

## 2020-12-29 DIAGNOSIS — M79674 Pain in right toe(s): Secondary | ICD-10-CM | POA: Diagnosis not present

## 2020-12-29 DIAGNOSIS — B351 Tinea unguium: Secondary | ICD-10-CM | POA: Diagnosis not present

## 2020-12-29 DIAGNOSIS — E1151 Type 2 diabetes mellitus with diabetic peripheral angiopathy without gangrene: Secondary | ICD-10-CM | POA: Diagnosis not present

## 2020-12-29 NOTE — Progress Notes (Signed)
This patient returns to my office for at risk foot care.  This patient requires this care by a professional since this patient will be at risk due to having type 2 diabetes     This patient is unable to cut nails himself since the patient cannot reach his nails.These nails are painful walking and wearing shoes.  This patient presents for at risk foot care today.  General Appearance  Alert, conversant and in no acute stress.  Vascular  Dorsalis pedis and posterior tibial  pulses are  weakly palpable  bilaterally.  Capillary return is within normal limits  bilaterally. Temperature is within normal limits  bilaterally.  Neurologic  Senn-Weinstein monofilament wire test within normal limits  bilaterally. Muscle power within normal limits bilaterally.  Nails Thick disfigured discolored nails with subungual debris  from hallux to fifth toes bilaterally. No evidence of bacterial infection or drainage bilaterally.  Subungual hematoma which is dried with no fluctuation.  Orthopedic  No limitations of motion  feet .  No crepitus or effusions noted.  No bony pathology or digital deformities noted.  Skin  normotropic skin with no porokeratosis noted bilaterally.  No signs of infections or ulcers noted.     Onychomycosis  Pain in right toes  Pain in left toes   Consent was obtained for treatment procedures.   Mechanical debridement of nails 1-5  bilaterally performed with a nail nipper.  Filed with dremel without incident.     Return office visit   4  months.                  Told patient to return for periodic foot care and evaluation due to potential at risk complications.   Gardiner Barefoot DPM

## 2021-01-13 ENCOUNTER — Other Ambulatory Visit: Payer: Self-pay

## 2021-01-13 ENCOUNTER — Encounter: Payer: Self-pay | Admitting: Physician Assistant

## 2021-01-13 ENCOUNTER — Ambulatory Visit (INDEPENDENT_AMBULATORY_CARE_PROVIDER_SITE_OTHER): Payer: Medicare Other | Admitting: Physician Assistant

## 2021-01-13 VITALS — BP 180/83 | HR 84 | Ht 71.0 in | Wt 160.0 lb

## 2021-01-13 DIAGNOSIS — R350 Frequency of micturition: Secondary | ICD-10-CM

## 2021-01-13 LAB — BLADDER SCAN AMB NON-IMAGING: Scan Result: 24

## 2021-01-13 MED ORDER — MIRABEGRON ER 50 MG PO TB24: 50.0000 mg | ORAL_TABLET | Freq: Every day | ORAL | 2 refills | Status: DC

## 2021-01-13 NOTE — Progress Notes (Signed)
01/13/2021 2:18 PM   Jeffrey [name] Aug 13, 1932 [id number]  CC: Chief Complaint  Patient presents with   Urinary Frequency    HPI: Jeffrey [name] is a 87 y.o. male with PMH prostate cancer s/p IMRT and brachytherapy, urethral stricture s/p internal ureterotomy and circumcision in November 2018, OSA not on CPAP, and LUTS including daytime frequency, nocturia, and postvoid dribbling who presents today for symptom recheck on Myrbetriq 25 mg daily.  Today he reports some symptomatic improvement on Myrbetriq.  He reports nocturia x2, previously x3-4, and states he has started taking it before bedtime.  Overall he is pleased with his progress, but feels there remains room for improvement on this medication.  PVR 24 mL.  PMH: Past Medical History:  Diagnosis Date   Acute CVA (cerebrovascular accident) ([place])    Acute encephalopathy 11/27/2014   Aortic atherosclerosis ([place]) 11/25/2014   Atrial fibrillation ([place])    a. 12/2018 Zio: Sinus rhythm, avg rate 71. 2 runs of VT (max 17 beats). 27 runs SVT (max 14 beats). Occas PACs/PVCs. No high grade AV block/pauses.   Chronic combined systolic (congestive) and diastolic (congestive) heart failure ([place])    a. 10/2014 Echo: EF 40-45%; b. 03/2018 Echo: EF 55-60%; c. 12/2018 Echo: EF 55-60%, mild conc LVH. Nl RV fxn. RVSP 51.65mHg. Mildly dil LA. Mod AS w/ severe Ca2+; d. 12/2019 Echo: EF 50-55%, mild LVH, gr2 DD. Sev elev PASP. Mild MR, mild to mod TR. Triv AI, mod to sev AS (area 0.77cm^2, mean grad 255mg. Vmax 2.9729m.   Diabetes mellitus    Hypercholesteremia    Hypertension    Impaired fasting glucose    Macular degeneration    Moderate to severe aortic stenosis    a. 12/2018 Echo: EF 55-60%, Mod AS w/ sev AoV Ca2+; b. 12/2019 Echo: EF 50-55%. Mod to sev AS (area 0.77cm^2, mean grad 77m47m Vmax 2.41m/30m  Multiple lacunar infarcts (HCC)    Obstructive sleep apnea 06/27/2018   Pro[place] cancer (HCC) [place] (Dr.[name])   Radiation  proctitis 11/2012   treated with APC ablation and Canasa suppositories (Dr[facility] Pyrtl[facility]ilarie FredricksonMS[facility] 10/[facility]   (hosp with FUO, encephalopathy)   Rotator cuff tear, right 10/2010   supraspinatous and infraspinatous   Seizure disorder (HCC) [place]/2021   TIA (transient ischemic attack) 10/02/13   [facility]   Type 2 diabetes mellitus with microalbuminuria or microproteinuria 01/15/2014    Surgical History: Past Surgical History:  Procedure Laterality Date   CATARACT EXTRACTION Right 01/2017   CIRCUMCISION  01/2017   Dr. DavisRosana HoesLONOSCOPY N/A 11/28/2012   Procedure: COLONOSCOPY;  Surgeon: Jay MJerene Bears  [facility];  Service: Gastroenterology;  Laterality: N/A;   INTERNAL URETHROTOMY  01/27/2016   WF   prostate seed implant     ROTATOR CUFF REPAIR  06/16/2011   right (Dr. GraveBerenice PrimasONSILLECTOMY AND ADENOIDECTOMY  age 89    Home Medications:  Allergies as of 01/13/2021   No Known Allergies      Medication List        Accurate as of January 13, 2021  2:18 PM. If you have any questions, ask your nurse or doctor.          acetaminophen 650 MG CR tablet Commonly known as: Tylenol Arthritis Pain Take 1 tablet (650 mg total) by mouth every 8 (eight) hours as needed.   amLODipine 2.5 MG tablet Commonly  known as: NORVASC Take 1 tablet (2.5 mg total) by mouth daily.   betamethasone dipropionate 0.05 % ointment Commonly known as: DIPROLENE Apply topically 2 (two) times daily.   carvedilol 6.25 MG tablet Commonly known as: COREG TAKE ONE TABLET TWICE DAILY WITH A MEAL   Eliquis 5 MG Tabs tablet Generic drug: apixaban TAKE ONE TABLET TWICE DAILY   levETIRAcetam 500 MG 24 hr tablet Commonly known as: KEPPRA XR Take 1 tablet (500 mg total) by mouth daily.   loratadine 10 MG tablet Commonly known as: CLARITIN Take by mouth.   lovastatin 40 MG tablet Commonly known as: MEVACOR TAKE TWO TABLETS EACH NIGHT AT BEDTIME    mirabegron ER 50 MG Tb24 tablet Commonly known as: MYRBETRIQ Take 1 tablet (50 mg total) by mouth daily. What changed:  medication strength how much to take Changed by: Debroah Loop, PA-C   Pfizer-BioNTech COVID-19 Vacc 30 MCG/0.3ML injection Generic drug: COVID-19 mRNA vaccine (Pfizer) USE AS DIRECTED   PreserVision AREDS Caps Take 1 capsule by mouth 2 (two) times daily.   UNABLE TO FIND Bevacizumab(AVASTIN) chemo injection for the eye every 10 weeks        Allergies:  No Known Allergies  Family History: Family History  Problem Relation Age of Onset   Heart disease Father 57       Died suddenly.  No diagnosis   Stroke Sister    Dementia Mother    Diabetes Mother    Heart disease Mother    Hypertension Mother    Cancer Daughter        ovarian (?) vs other male cancer; s/p hyst doing well   Diabetes Daughter        GDM, and now AODM   Heart disease Son        congestive heart failure--improved   Congestive Heart Failure Grandchild     Social History:   reports that he quit smoking about 40 years ago. His smoking use included cigarettes. He has a 20.00 pack-year smoking history. He has never used smokeless tobacco. He reports current alcohol use of about 14.0 standard drinks per week. He reports that he does not use drugs.  Physical Exam: BP (!) 180/83   Pulse 84   Ht '5\' 11"'$  (1.803 m)   Wt 160 lb (72.6 kg)   BMI 22.32 kg/m   Constitutional:  Alert and oriented, no acute distress, nontoxic appearing HEENT: [place], AT Cardiovascular: No clubbing, cyanosis, or edema Respiratory: Normal respiratory effort, no increased work of breathing Skin: No rashes, bruises or suspicious lesions Neurologic: Grossly intact, no focal deficits, moving all 4 extremities Psychiatric: Normal mood and affect  Laboratory Data: Results for orders placed or performed in visit on 01/13/21  Bladder Scan (Post Void Residual) in office  Result Value Ref Range   Scan Result  24    Assessment & Plan:   1. Urinary frequency Symptomatic improvement on Myrbetriq 25 mg daily and patient is emptying appropriately.  Will increase dose to 50 mg daily and plan for symptom recheck and PVR in 3 months.  Patient is in agreement with this plan. - Bladder Scan (Post Void Residual) in office - mirabegron ER (MYRBETRIQ) 50 MG TB24 tablet; Take 1 tablet (50 mg total) by mouth daily.  Dispense: 30 tablet; Refill: 2  Return in about 3 months (around 04/15/2021) for Symptom recheck with PVR.  Debroah Loop, PA-C  [facility] [address], [place], [place] [address] [phone]

## 2021-01-20 ENCOUNTER — Telehealth: Payer: Self-pay | Admitting: *Deleted

## 2021-01-20 NOTE — Telephone Encounter (Signed)
Patient's wife called regarding dose increase for Myrbetriq from 25 to '50mg'$ . Verified per last visit increase-she wanted to verify increase before starting new RX. Voiced understanding.

## 2021-01-29 DIAGNOSIS — H43813 Vitreous degeneration, bilateral: Secondary | ICD-10-CM | POA: Diagnosis not present

## 2021-01-29 DIAGNOSIS — H353231 Exudative age-related macular degeneration, bilateral, with active choroidal neovascularization: Secondary | ICD-10-CM | POA: Diagnosis not present

## 2021-02-16 ENCOUNTER — Other Ambulatory Visit (HOSPITAL_COMMUNITY): Payer: Self-pay

## 2021-02-17 NOTE — Progress Notes (Signed)
Chief Complaint  Patient presents with   Consult    Patient has been having spells where he has this blank look on his face-this has been happening for about a year. Summer time is worse, not sure if it is the sunshine or heat. Monday was the last one. Having high dose  flu and covid booster tomorrow @ [facility].    Patient presents accompanied by his wife today.  He is feeling a little more unsteady.  Sometimes he gets up too quick from sitting, feels lightheaded--he drinks water and in a little bit he feels better. This occurs frequently with standing, if he has been sitting for a prolonged period.  He doesn't really remember the where he gets the blank look, as his wife reports, just what he is told. Last episode of "blank look" was 2 days ago.  Wife reports he doesn't remember the episodes, or what he says, and sometimes is a little aggressive, pushing her away when she tries to help.  He thinks that those times are different, that he just may be leaning on something for support, not about to pass out, and she is trying to help steady him. He thinks his medications might be contributing, specifically he mentions Keppra.  He states he sent in a list of his BP's about a month ago--I don't recall seeing these, not in chart.  He historically has very high blood pressures in the office, much lower at home.  He reports that BP's ranged from 98-120/70's.  He had an ER visit 6/15, after becoming dazed, unresponsive with his eyes open after he had been mowing the grass. He had taken his first dose of silodosin for BPH that day.  He was alert and responsive when EMS was there, but had another episode when trying to walk back to the house (with EMS). He has has had several similar episodes of near syncope, felt to be related to overheating, exertion, dehydration.  He also has been evaluated by neuro and is on treatment with Keppra for possible seizures (see below). Mild anemia noted in ER.  He is on  anticoagulants, denies any bleeding. Lab Results  Component Value Date   WBC 6.4 11/12/2020   HGB 11.6 (L) 11/12/2020   HCT 36.0 (L) 11/12/2020   MCV 101.7 (H) 11/12/2020   PLT 231 11/12/2020   H/o Stroke (10/2014) and seizures--He last saw neurologist n April.  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 He has been having similar episodes intermittently over the last year.  He continues on 500mg  Keppra (at one point neurologist suggested increasing to 1000mg , but never increased).   He remains on Eliquis for anticoagulation related to afib for prevention of further strokes.  He denies any bleeding, bruising. He is under the care of [name]. He was last seen in 07/2020, due for follow-up now.  BP was high at last visit with [name] in 07/2020, when off amlodipine.  He restarted it at 2.5mg , but pt didn't take--has lower blood pressures at home.  Last echo was 07/2020, AV mod-severe stenosis.  Due for f/u visit and echo now. They called for a med refill, realized needed visit, scheduled today for November. He denies any chest pain or DOE.  Sees urologist.  Currently not taking myrbetriq or sildosin (only took 1 dose of this, ended up in ER in June as reported above).   Found medication helped some with  nocturia (myrbetriq). He was concerned about the dose.   Alcohol--He is currently having 2 beers/day, sometimes a missed drink along with it.  He enjoys it. He knows he has been told to cut back (and did for a while, but is back up to his usual).   He drinks only 2 glasses of water daily.  1-2 cups/coffee in the morning. Sometimes urine is yellow, other times it is clear.  Can't recall what is was today (has only gone twice all day).  PMH, PSH, SH reviewed  Outpatient Encounter Medications as of 02/18/2021  Medication Sig   acetaminophen (TYLENOL ARTHRITIS PAIN) 650 MG CR tablet Take 1 tablet (650 mg total)  by mouth every 8 (eight) hours as needed. (Patient taking differently: Take 1,300 mg by mouth in the morning and at bedtime.)   carvedilol (COREG) 6.25 MG tablet TAKE ONE TABLET TWICE DAILY WITH A MEAL   ELIQUIS 5 MG TABS tablet TAKE ONE TABLET TWICE DAILY   levETIRAcetam (KEPPRA XR) 500 MG 24 hr tablet Take 1 tablet (500 mg total) by mouth daily.   lovastatin (MEVACOR) 40 MG tablet TAKE TWO TABLETS EACH NIGHT AT BEDTIME   Multiple Vitamins-Minerals (PRESERVISION AREDS) CAPS Take 1 capsule by mouth 2 (two) times daily.    UNABLE TO FIND Bevacizumab(AVASTIN) chemo injection for the eye every 10 weeks   betamethasone dipropionate (DIPROLENE) 0.05 % ointment Apply topically 2 (two) times daily. (Patient not taking: Reported on 02/18/2021)   loratadine (CLARITIN) 10 MG tablet Take by mouth.  (Patient not taking: Reported on 02/18/2021)   [DISCONTINUED] amLODipine (NORVASC) 2.5 MG tablet Take 1 tablet (2.5 mg total) by mouth daily.   [DISCONTINUED] carvedilol (COREG) 6.25 MG tablet TAKE ONE TABLET TWICE DAILY WITH A MEAL   [DISCONTINUED] COVID-19 mRNA vaccine, Pfizer, 30 MCG/0.3ML injection USE AS DIRECTED   [DISCONTINUED] influenza vaccine adjuvanted (FLUAD) 0.5 ML injection Inject 0.5 mLs into the muscle once for 1 dose.   [DISCONTINUED] mirabegron ER (MYRBETRIQ) 50 MG TB24 tablet Take 1 tablet (50 mg total) by mouth daily.   No facility-administered encounter medications on file as of 02/18/2021.   No Known Allergies  ROS: no fever or chills, URI symptoms, just a chronic runny nose.  Denies cough, shortness of breath (only if exerting in the sun).  Denies headaches.  Dizziness with standing, none currently.  Spells as noted in HPI. Denies vertigo.  Occasional diarrhea or constipation. No nausea or vomiting, reports he has a good appetite.  PHYSICAL EXAM:  BP 138/80   Pulse 80   Ht 5\' 11"  (1.803 m)   Wt 148 lb 12.8 oz (67.5 kg)   BMI 20.75 kg/m   BP 109/76 at home  today. Orthostatics: 200/110 P 76 lying, 200/100 P 80 sitting, 150/90 standing.  Wt Readings from Last 3 Encounters:  02/18/21 148 lb 12.8 oz (67.5 kg)  01/13/21 160 lb (72.6 kg)  12/15/20 160 lb (72.6 kg)   Weight 08/2020 152# 3.2 oz Weight 01/2020 157# 6.4 oz, 172# 3.2 oz in 07/2019  Frail appearing elderly male, in no distress.  He is slightly pale. HEENT: conjunctiva and sclera are clear, EOMI, wearing mask Neck: no lymphadenopathy or thyromegaly Heart: Sounds fairly regular.  3/6 harsh systolic murmur noted throughout, loudest at apex and RUSB. Lungs: clear bilaterally Back: no spinal or CVA tenderness Abdomen: Soft, nontender.  Central obesity is notably less prominent. Extremities: no edema, normal pulses, Dry skin on legs Neuro: alert and oriented.  Resting tremor noted.  Normal, slow gait Psych: normal mood, affect, hygiene and grooming.   Urine dip: SG 1.030, trace blood, mod ketones, tr protein   ASSESSMENT/PLAN:  Orthostasis - I think much of his "spells" is related to dehydration, orthostasis.  Cannot r/o sz component. discussed need for water, less alcohol.  Macrocytic anemia - noted in ER in June. Recheck.  Advised to cut back on alcohol.  Will check for B12 deficiency. - Plan: CBC with Differential/Platelet, Vitamin B12  Essential hypertension, benign - Known white coat component.  Orthostasis noted today.  BP's low at home. Due to f/u with [name] and for echo - Plan: Comprehensive metabolic panel  Long term current use of anticoagulant - for afib. no e/o bleeding - Plan: CBC with Differential/Platelet  Seizure disorder ([place]) - on Keppra per neuro.  If spells continue despite increased hydration, should f/u with neuro  Urinary frequency - much less bothersome, due to inadequate fluid intake.  Discussed at length. Under care of urologist; if this is why not drinking water, restart myrbetriq - Plan: POCT Urinalysis DIP (Proadvantage Device)  Weight loss - at  least partly related to some dehydration.  I'm glad that he is losing some of excess weight at abdomen, but needs to get adequate nutrition.  Mild protein-calorie malnutrition ([place]) - encouraged healthy diet--adequate protein, calories, cut back on alcohol, increase water intake.  Past due for Tdap, reminded to get from pharmacy, needs to wait 2 weeks after his planned flu/COVID booster.  Schedule f/u with [name]  I spent 50 minutes dedicated to the care of this patient, including pre-visit review of records, face to face time, post-visit ordering of testing and documentation.

## 2021-02-18 ENCOUNTER — Other Ambulatory Visit: Payer: Self-pay

## 2021-02-18 ENCOUNTER — Other Ambulatory Visit (HOSPITAL_COMMUNITY): Payer: Self-pay

## 2021-02-18 ENCOUNTER — Encounter: Payer: Self-pay | Admitting: Family Medicine

## 2021-02-18 ENCOUNTER — Ambulatory Visit (INDEPENDENT_AMBULATORY_CARE_PROVIDER_SITE_OTHER): Payer: Medicare Other | Admitting: Family Medicine

## 2021-02-18 ENCOUNTER — Other Ambulatory Visit: Payer: Self-pay | Admitting: Physician Assistant

## 2021-02-18 VITALS — BP 150/90 | HR 80 | Ht 71.0 in | Wt 148.8 lb

## 2021-02-18 DIAGNOSIS — G40909 Epilepsy, unspecified, not intractable, without status epilepticus: Secondary | ICD-10-CM

## 2021-02-18 DIAGNOSIS — Z7901 Long term (current) use of anticoagulants: Secondary | ICD-10-CM | POA: Diagnosis not present

## 2021-02-18 DIAGNOSIS — R634 Abnormal weight loss: Secondary | ICD-10-CM

## 2021-02-18 DIAGNOSIS — D539 Nutritional anemia, unspecified: Secondary | ICD-10-CM | POA: Diagnosis not present

## 2021-02-18 DIAGNOSIS — R350 Frequency of micturition: Secondary | ICD-10-CM | POA: Diagnosis not present

## 2021-02-18 DIAGNOSIS — I6522 Occlusion and stenosis of left carotid artery: Secondary | ICD-10-CM | POA: Diagnosis not present

## 2021-02-18 DIAGNOSIS — Z23 Encounter for immunization: Secondary | ICD-10-CM | POA: Diagnosis not present

## 2021-02-18 DIAGNOSIS — I951 Orthostatic hypotension: Secondary | ICD-10-CM

## 2021-02-18 DIAGNOSIS — E441 Mild protein-calorie malnutrition: Secondary | ICD-10-CM

## 2021-02-18 DIAGNOSIS — I10 Essential (primary) hypertension: Secondary | ICD-10-CM

## 2021-02-18 LAB — POCT URINALYSIS DIP (PROADVANTAGE DEVICE)
Bilirubin, UA: NEGATIVE
Glucose, UA: NEGATIVE mg/dL
Leukocytes, UA: NEGATIVE
Nitrite, UA: NEGATIVE
Specific Gravity, Urine: 1.03
Urobilinogen, Ur: NEGATIVE
pH, UA: 5.5 (ref 5.0–8.0)

## 2021-02-18 MED ORDER — INFLUENZA VAC A&B SA ADJ QUAD 0.5 ML IM PRSY
0.5000 mL | PREFILLED_SYRINGE | Freq: Once | INTRAMUSCULAR | 0 refills | Status: AC
  Filled 2021-02-18: qty 0.5, 1d supply, fill #0

## 2021-02-18 NOTE — Telephone Encounter (Signed)
Scheduled

## 2021-02-18 NOTE — Patient Instructions (Addendum)
I believe you spells are related to probable low blood pressure (orthostasis). I feel that you are not drinking enough water, are likely dehydrated, and that is contributing to your "spells" and dizziness with standing. You should be drinking 6-8 glasses of water daily (and supposed to have extra for any caffeine or alcohol). PLEASE cut back on your alcohol intake.  I would recommend none, but would be happy to see you cut back to just 1/day as a start, if you are able.  If you enjoy the refreshing cold nature of a beer, try a nonalcoholic beer.  The other option of what is causing these spells, are seizures. I feels since they seem to be more seasonal and related to the heat, that we should try increasing your water intake first (along with checking some labs).  If the spells continue, then you should discuss this with the neurologist, to see if the Keppra dose should be increased.   Your are past due for your tetanus shot (TdaP).  You need to get this from a pharmacy.  You need to wait 2 weeks from tomorrow's flu and COVID vaccines.

## 2021-02-18 NOTE — Telephone Encounter (Signed)
Please contact pt for future appointment. Pt is due for 6 month f/u.

## 2021-02-19 LAB — COMPREHENSIVE METABOLIC PANEL
ALT: 11 IU/L (ref 0–44)
AST: 19 IU/L (ref 0–40)
Albumin/Globulin Ratio: 1.8 (ref 1.2–2.2)
Albumin: 4.7 g/dL — ABNORMAL HIGH (ref 3.6–4.6)
Alkaline Phosphatase: 39 IU/L — ABNORMAL LOW (ref 44–121)
BUN/Creatinine Ratio: 23 (ref 10–24)
BUN: 24 mg/dL (ref 8–27)
Bilirubin Total: 0.5 mg/dL (ref 0.0–1.2)
CO2: 26 mmol/L (ref 20–29)
Calcium: 9.6 mg/dL (ref 8.6–10.2)
Chloride: 103 mmol/L (ref 96–106)
Creatinine, Ser: 1.03 mg/dL (ref 0.76–1.27)
Globulin, Total: 2.6 g/dL (ref 1.5–4.5)
Glucose: 111 mg/dL — ABNORMAL HIGH (ref 65–99)
Potassium: 4.6 mmol/L (ref 3.5–5.2)
Sodium: 144 mmol/L (ref 134–144)
Total Protein: 7.3 g/dL (ref 6.0–8.5)
eGFR: 71 mL/min/{1.73_m2} (ref >59)

## 2021-02-19 LAB — CBC WITH DIFFERENTIAL/PLATELET
Basophils Absolute: 0 10*3/uL (ref 0.0–0.2)
Basos: 1 %
EOS (ABSOLUTE): 0 10*3/uL (ref 0.0–0.4)
Eos: 1 %
Hematocrit: 41.2 % (ref 37.5–51.0)
Hemoglobin: 13.7 g/dL (ref 13.0–17.7)
Immature Grans (Abs): 0 10*3/uL (ref 0.0–0.1)
Immature Granulocytes: 0 %
Lymphocytes Absolute: 2.5 10*3/uL (ref 0.7–3.1)
Lymphs: 32 %
MCH: 32.5 pg (ref 26.6–33.0)
MCHC: 33.3 g/dL (ref 31.5–35.7)
MCV: 98 fL — ABNORMAL HIGH (ref 79–97)
Monocytes Absolute: 0.8 10*3/uL (ref 0.1–0.9)
Monocytes: 11 %
Neutrophils Absolute: 4.5 10*3/uL (ref 1.4–7.0)
Neutrophils: 55 %
Platelets: 280 10*3/uL (ref 150–450)
RBC: 4.22 x10E6/uL (ref 4.14–5.80)
RDW: 12 % (ref 11.6–15.4)
WBC: 7.9 10*3/uL (ref 3.4–10.8)

## 2021-02-19 LAB — VITAMIN B12: Vitamin B-12: 282 pg/mL (ref 232–1245)

## 2021-02-20 ENCOUNTER — Ambulatory Visit: Payer: Medicare Other | Attending: Internal Medicine

## 2021-02-20 ENCOUNTER — Other Ambulatory Visit: Payer: Self-pay

## 2021-02-20 DIAGNOSIS — Z23 Encounter for immunization: Secondary | ICD-10-CM

## 2021-02-20 DIAGNOSIS — E441 Mild protein-calorie malnutrition: Secondary | ICD-10-CM | POA: Insufficient documentation

## 2021-02-20 MED ORDER — PFIZER COVID-19 VAC BIVALENT 30 MCG/0.3ML IM SUSP
INTRAMUSCULAR | 0 refills | Status: DC
  Filled 2021-02-20: qty 0.3, 1d supply, fill #0

## 2021-03-03 ENCOUNTER — Telehealth: Payer: Self-pay | Admitting: Family Medicine

## 2021-03-03 NOTE — Chronic Care Management (AMB) (Signed)
  Chronic Care Management   Note  03/03/2021 Name: Jeffrey [name] MRN: [id number] DOB: February 19, 1933  Jeffrey [name] is a 87 y.o. year old male who is a primary care patient of Rita Ohara, MD. I reached out to [name] by phone today in response to a referral sent by [name]'s PCP, Rita Ohara, MD.   [name] was given information about Chronic Care Management services today including:  CCM service includes personalized support from designated clinical staff supervised by his physician, including individualized plan of care and coordination with other care providers 24/7 contact phone numbers for assistance for urgent and routine care needs. Service will only be billed when office clinical staff spend 20 minutes or more in a month to coordinate care. Only one practitioner may furnish and bill the service in a calendar month. The patient may stop CCM services at any time (effective at the end of the month) by phone call to the office staff.   Patient agreed to services and verbal consent obtained.   Follow up plan:   Tatjana Secretary/administrator

## 2021-03-06 ENCOUNTER — Encounter: Payer: Self-pay | Admitting: Physician Assistant

## 2021-03-06 ENCOUNTER — Telehealth: Payer: Self-pay | Admitting: Physician Assistant

## 2021-03-06 ENCOUNTER — Encounter: Payer: Medicare Other | Admitting: Physician Assistant

## 2021-03-06 ENCOUNTER — Other Ambulatory Visit: Payer: Self-pay

## 2021-03-06 NOTE — Telephone Encounter (Signed)
Patient left without being seen.   Attempted to contact to reschedule .  No ans no vm  on home and cell.   Will attempt at a later time.

## 2021-03-06 NOTE — Progress Notes (Signed)
err

## 2021-03-09 ENCOUNTER — Other Ambulatory Visit: Payer: Self-pay

## 2021-03-09 ENCOUNTER — Ambulatory Visit (INDEPENDENT_AMBULATORY_CARE_PROVIDER_SITE_OTHER): Payer: Medicare Other | Admitting: Adult Health

## 2021-03-09 ENCOUNTER — Encounter: Payer: Self-pay | Admitting: Adult Health

## 2021-03-09 VITALS — BP 146/94 | HR 68 | Ht 71.0 in | Wt 152.0 lb

## 2021-03-09 DIAGNOSIS — I951 Orthostatic hypotension: Secondary | ICD-10-CM

## 2021-03-09 DIAGNOSIS — R251 Tremor, unspecified: Secondary | ICD-10-CM | POA: Diagnosis not present

## 2021-03-09 DIAGNOSIS — G40109 Localization-related (focal) (partial) symptomatic epilepsy and epileptic syndromes with simple partial seizures, not intractable, without status epilepticus: Secondary | ICD-10-CM

## 2021-03-09 MED ORDER — LEVETIRACETAM ER 750 MG PO TB24: 750.0000 mg | ORAL_TABLET | Freq: Every day | ORAL | 5 refills | Status: DC

## 2021-03-09 NOTE — Progress Notes (Signed)
[facility] [address] [place]. [place] [address] [phone]       OFFICE FOLLOW UP NOTE  Jeffrey. Jeffrey [name] Date of Birth:  1932-08-31 Medical Record Number:  [id number]   Referring MD:   Jeffrey [name] Reason for visit: Seizures [facility] provider: Dr. Leonie [name]   Chief Complaint  Patient presents with   Follow-up    Rm 2 with wife Jeffrey [name]) here for 6 month f/u. Pt reports he has been doing well, reports at times he feels "swimmy headed". Denies any falls.       HPI:   Initial visit 05/02/2018 PS: Jeffrey [name] is a pleasant 87 year old Caucasian male was seen today for initial office consultation visit.  He is accompanied by his wife and daughter.  History is obtained from them and review of referral notes.  I have reviewed imaging films in PACS.  He has been having multiple brief episodes of altered awareness for several years but these appear to have increased in the last 6 months and frequency as well as duration.  He had a prolonged episode recently in November 2019 when he went to the ER.  CT scan of the head was obtained which I reviewed showed no acute abnormality except changes of age-related small vessel disease.  He is recently had a few back-to-back episodes as well.  He is unable to identify specific trigger for his episodes.  He is usually staring during these episodes and does not respond to commands.  He does not lose consciousness fall or hurt himself.  His episodes were previously lasting less than a minute the dose of few recent ones have been more prolonged.  Following some of these episodes he tends to get agitated and disoriented.  The patient's daughter feels she is noticed that he does some automatic hand movements and some of these episodes.  He denies any headache or any aura prior to these episodes.  He has no remote history of head injury with loss of consciousness intracranial hemorrhage or strokes.  There is no family history of epilepsy or seizures.   There is no specific pattern for these episodes which may occur once every few months to several in a week.  He has not had an EEG or MRI scan done or a trial of seizure medications. He had prior history of right anterior frontal lacunar infarct in 11/27/2014 he was felt to have asymptomatic 80 to 90% left ICA stenosis at that time.  Echocardiogram was normal.  He has remote history of [facility] spotted fever with encephalitis.  He was treated with a 2-week course of doxycycline.  He did not have any definite seizures at that time.  He had episode of brief staring with a blank look on his face in May 2016 and was admitted to [facility] where he had an EEG which was negative for seizures.  He was seen by Jeffrey [name] neurologist as an outpatient and had a negative work-up for his seizures as well.  He was referred to Jeffrey [name] feels vascular surgeon for left carotid stenosis follow-up with ultrasound in the office showed stenosis to be much less and hence conservative follow-up was recommended.  He was seen by me in the office in March 2017 but has not followed up since then.  He was found to subsequently have a transient episode of paroxysmal A. fib and was started on Eliquis by cardiologist Jeffrey [name].  He also had a second EEG done in  September 2016 which was also unremarkable except for mild generalized slowing.  Update 08/07/2018 PS : He returns for follow-up after last visit 3 months ago.  He is accompanied by his wife.  He states he is doing well.  Is tolerating Keppra XR 500 mg quite well without any side effects.  He has had no spells like seizures or speech difficulties since the last visit.  He has had no stroke or TIA symptoms either.  He underwent EEG on 06/12/2018 which was read by me and was normal.  MRI scan of the brain done on 06/14/2018 which I have personally reviewed the images shows tiny right posterior temporal white matter lacunar infarct which was clinically silent as  patient had no symptoms at that time.  Carotid ultrasound previously done on 12/23/2017 had shown 40 to 59% left ICA stenosis no significant stenosis on the right.  Transthoracic echo done on 04/05/2018 and showed normal ejection fraction.  Lipid profile done on 07/05/2018 was unremarkable with LDL cholesterol of 66 mg percent, HDL of 59 and total cholesterol 152 mg percent.  Hemoglobin A1c on 06/28/2018 was 6.0.  Patient has been diagnosed with sleep apnea by his pulmonologist Jeffrey [name] and has started using CPAP every night recently.  He has no new complaints today.  He states his tremors are unchanged and are not functionally disabling.  He does follow-up with Jeffrey [name] from vascular surgery for carotid stenosis and will have follow-up carotid ultrasound done in upcoming visit in the spring  Update 02/20/2019 Jeffrey [name]: Jeffrey [name] is a 87 year old male who is being seen today for follow-up accompanied by wife.  He has been doing well from a neurological standpoint.  He continues on Keppra XR 500 mg daily without recurrent seizure episodes or speech difficulties.  He did have a ED visit on 12/29/2018 for syncopal episode which was felt likely due to orthostatic hypotension with cardiology follow-up and medications adjusted.  Syncopal episode was not felt to be related to seizures.  Denies new or recurring stroke/TIA symptoms as well.  Upper extremity L>R tremors have been stable and do not interfere with daily functioning.  He remains on Eliquis without bleeding or bruising.  He endorses ongoing compliance with CPAP for OSA management.  Blood pressure today elevated at 189/90 but does endorse elevation at doctor's appointments.  He does monitor at home and typically 120s/70s.  Denies new or worsening stroke/TIA symptoms.  Update 09/03/2019 Jeffrey [name]: He returns for follow-up after last visit 6 months ago.  He states he is doing well but wife states that a few weeks ago she noticed episode of transient unresponsiveness where  he was staring ahead and not immediately responsive this lasted only a few moments and then returned back to baseline but patient did not remember that.  There were no automatic movements or lipsmacking noted.  There is no confusion or disorientation or headache.  There is no apparent trigger.  He remains on Keppra Exar 500 mg daily and is tolerating well without side effects.  He remains on Eliquis for his A. fib and has not had any stroke or TIA symptoms.  His blood pressure is significantly elevated today with the patient's wife claims that this on whitecoat hypertension as it is much better at home.  He continues to have intermittent hand tremor but this is not functionally disabling.  He has no other new complaints.  Update 03/05/2020 Jeffrey [name]: He returns for follow-up after last visit 6 months ago.  Is  accompanied by his wife.  Wife feels that these episodes of transient dizziness and unresponsiveness and staring ahead is still occurring but she is not sure how frequently is happening.  She gets this is probably once a month or so.  He has tolerated increasing the dose of Keppra XR 2000 mg daily without side effects.  He continues to have mild tremors in his hands and head but these are not interfering with day-to-day activities and remain unchanged.  Is tolerating Eliquis well without bruising or bleeding.  He had a repeat EEG done on 11/15/2019 which was normal.  He has no new complaints.  Update 09/03/2020 Jeffrey [name]: Returns for 65-month follow-up.  Stable since prior visit without any reoccurring seizures or symptoms.  Remains on Keppra XR but per patient report, only taking 1 tablet at night. At prior visit, reported taking 2 tab for 1000mg  dosing but he denies ever taking 2 tablets at night.  He is tolerating current dose of Keppra without side effects.  Chronic mild upper extremity tremor with mild progression but does not interfer with daily activity. Hx of A. fib on Eliquis routinely monitored by cardiology.   No concerns at this time.  Update 03/09/2021 Jeffrey [name]: Returns for 86-month follow-up accompanied by his wife.  ED eval 11/12/2020 for unresponsive episode with eyes open, staring and altered awareness after mowing his grass. Noted to have taken his initial dose of silodosin for his BPH that same day.  Event similar to his prior episodes.   Reports occasional feeling of "swimmy headedness" when stands too quickly and with increased exposure to heat/sunshine. Per wife, he had an additional event (similar to the one in June) with sensation of dizziness/lightheadedness, impairment of consciousness with unresponsive, and staring.  He does not remember these episodes occurring.  Lasting only short duration.  Denies actual loss of consciousness or passing out, falling, tongue biting, weakness, or autonomic movements. Wife and son able to assist pt back into house and after sitting and drinking water, shortly returned back to baseline.  He will occasionally feel lightheadedness not associated with staring or altered consciousness episodes.  He does admit to limited fluid intake drinking approximately 4-8 oz of water daily. Admits to 1-2 beers/day and occasionally a mixed drink (states "it doesn't make my doctor happy but it makes me happy").   Reports compliance on Keppra XR 500 mg nightly - denies any missed dosages.  He was previously advised to increase dose after similar episode occurred in 07/2019 but this never occurred. When seen on 09/03/2020, he denied any additional events in almost 1 year therefore remained on keppra XR 500mg  dosing.   Tremor stable noting improvement since prior visit and can wax/wane.  Does not interfere with daily activity or functioning.  Occasionally can be worse at night - unable to identify trigger.  Recently started on B12 supplement for B12 deficiency.  Reports compliance on Eliquis for secondary stroke prevention measures and history of A. fib managed by cardiology.  Routinely followed  by PCP.  No further concerns at this time.         ROS:   14 system review of systems is positive for those listed in HPI denies and all other systems negative  PMH:  Past Medical History:  Diagnosis Date   Acute CVA (cerebrovascular accident) ([place])    Acute encephalopathy 11/27/2014   Aortic atherosclerosis ([place]) 11/25/2014   Atrial fibrillation ([place])    a. 12/2018 [facility]: Sinus rhythm, avg rate 71. 2 runs of  VT (max 17 beats). 27 runs SVT (max 14 beats). Occas PACs/PVCs. No high grade AV block/pauses.   Chronic combined systolic (congestive) and diastolic (congestive) heart failure ([place])    a. 10/2014 Echo: EF 40-45%; b. 03/2018 Echo: EF 55-60%; c. 12/2018 Echo: EF 55-60%, mild conc LVH. Nl RV fxn. RVSP 51.55mmHg. Mildly dil LA. Mod AS w/ severe Ca2+; d. 12/2019 Echo: EF 50-55%, mild LVH, gr2 DD. Sev elev PASP. Mild Jeffrey, mild to mod TR. Triv AI, mod to sev AS (area 0.77cm^2, mean grad 55mmHg. Vmax 2.57m/s).   Diabetes mellitus    Hypercholesteremia    Hypertension    Impaired fasting glucose    Macular degeneration    Moderate to severe aortic stenosis    a. 12/2018 Echo: EF 55-60%, Mod AS w/ sev AoV Ca2+; b. 12/2019 Echo: EF 50-55%. Mod to sev AS (area 0.77cm^2, mean grad 63mmHg. Vmax 2.93m/s).   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 Hilarie Fredrickson)   [facility] spotted fever) 10/2014   (hosp with FUO, encephalopathy)   Rotator cuff tear, right 10/2010   supraspinatous and infraspinatous   Seizure disorder ([place]) 07/25/2019   TIA (transient ischemic attack) 10/02/13   hosp at 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 01/15/2014    Social History:  Social History   Socioeconomic History   Marital status: Married    Spouse name: Jeffrey [name]   Number of children: 4    Years of education: Not on file   Highest education level: Not on file  Occupational History   Occupation: Retired  Tobacco Use   Smoking status: Former    Packs/day: 1.00    Years: 20.00    Pack years: 20.00    Types: Cigarettes    Quit date: 05/31/1980    Years since quitting: 40.8   Smokeless tobacco: Never  Vaping Use   Vaping Use: Never used  Substance and Sexual Activity   Alcohol use: Yes    Alcohol/week: 14.0 standard drinks    Types: 14 Cans of beer per week    Comment: 2 cans of beer most days of the week   Drug use: No   Sexual activity: Not Currently    Partners: Female    Comment: issues with ED  Other Topics Concern   Not on file  Social History Narrative   Married. Retired Engineer, structural.  2 sons, 2 daughters (all in [place]), 15 grandchildren, 9 great grandchildren   Lives with wife, 1 dog.   Right Handed   Drinks 2 cups of caffeine daily   Social Determinants of Health   Financial Resource Strain: Not on file  Food Insecurity: Not on file  Transportation Needs: Not on file  Physical Activity: Not on file  Stress: Not on file  Social Connections: Not on file  Intimate Partner Violence: Not on file    Medications:   Current Outpatient Medications on File Prior to Visit  Medication Sig Dispense Refill   acetaminophen (TYLENOL ARTHRITIS PAIN) 650 MG CR tablet Take 1 tablet (650 mg total) by mouth every 8 (eight) hours as needed. (Patient taking differently: Take 1,300 mg by mouth in the morning and at bedtime.)     carvedilol (COREG) 6.25 MG tablet TAKE ONE TABLET TWICE DAILY WITH A MEAL 180  tablet 0   COVID-19 mRNA bivalent vaccine, Pfizer, (PFIZER COVID-19 VAC BIVALENT) injection Inject into the muscle. 0.3 mL 0   Cyanocobalamin (VITAMIN B 12 PO) Take by mouth.     ELIQUIS 5 MG TABS tablet TAKE ONE TABLET TWICE DAILY 180 tablet 1   levETIRAcetam (KEPPRA XR) 500 MG 24 hr tablet Take 1 tablet (500 mg total) by mouth daily. 90 tablet 3   loratadine (CLARITIN)  10 MG tablet Take by mouth.     lovastatin (MEVACOR) 40 MG tablet TAKE TWO TABLETS EACH NIGHT AT BEDTIME 180 tablet 3   Multiple Vitamins-Minerals (PRESERVISION AREDS) CAPS Take 1 capsule by mouth 2 (two) times daily.      UNABLE TO FIND Bevacizumab(AVASTIN) chemo injection for the eye every 10 weeks     No current facility-administered medications on file prior to visit.    Allergies:  No Known Allergies  Physical Exam  Today's Vitals   03/09/21 0938  BP: (!) 146/94  Pulse: 68  SpO2: 98%  Weight: 152 lb (68.9 kg)  Height: 5\' 11"  (1.803 m)    Body mass index is 21.2 kg/m.  General: Very Pleasant frail elderly Caucasian male seated, in no evident distress Head: head normocephalic and atraumatic.   Cardiovascular: regular rate and rhythm Musculoskeletal: no deformity Skin:  no rash/petichiae Vascular:  Normal pulses all extremities  Neurologic Exam Mental Status: Awake and fully alert.  Fluent speech and language.  Oriented to place and time. Recent and remote memory intact. Attention span, concentration and fund of knowledge appropriate. Mood and affect appropriate.   Cranial Nerves: Pupils equal, briskly reactive to light. Extraocular movements full without nystagmus. Visual fields full to confrontation.  HOH bilaterally. Facial sensation intact. Face, tongue, palate moves normally and symmetrically.  Motor: Normal bulk and tone. Normal strength in all tested extremity muscles. Mild action tremor of outstretched upper extremities left greater than right. Mild LUE tremor at rest. Intermittent head tremor.  Mild Cogwheel rigidity left greater than right wrist upon activation mainly.  No bradykinesia.   Sensory.: intact to touch , pinprick , position and vibratory sensation.  Coordination: Rapid alternating movements normal in all extremities. Finger-to-nose and heel-to-shin performed accurately bilaterally. Gait and Station: Arises from chair without difficulty. Stance is normal.  Gait demonstrates normal stride length and balance but slight diminished left arm swing. No festination or stooped posture.  Moderate difficulty performing heel, toe and tandem walk .   Reflexes: 1+ and symmetric. Toes downgoing.      ASSESSMENT/PLAN: 88 year long-standing recurrent transient stereotypical episodes of altered awareness with speech difficulties and starring possibly complex partial seizures.  He also has mild left greater than right upper extremity and head tremor possibly early parkinsonian tremor vs essential tremor which does not appear to be functionally disabling or progressive     Complex partial seizures -Concern of recent events (June ED eval and event 2 weeks ago) more consistent with partial type seizure - similar episode 07/2019.  Long discussion with patient and wife regarding difference of complex partial seizure and orthostasis. Episodes seem to worsen with heat or increased exertion and discussed seizure provoking triggers -Recommend increasing Keppra XR from 500 mg to 750 mg nightly - low threshold to increase further  -After prior seizure in 07/2019, Jeffrey [name] recommended increasing Keppra XR dosage to 1000 mg nightly but for unknown reason, this never occurred and remained on Keppra XR 500mg  nightly - when he was seen approx 1 year later (09/03/2020), remained on  500mg  dosing as stable and no seizure activity  Orthostasis -Longstanding history of orthostatic hypotension -occasional lightheadedness upon standing or with quick movements more consistent with orthostasis -discussed importance of adequate fluid intake and limiting EtOH use as this is likely largely contributing and slow position changes.  -continue to follow with cardiology  Tremor -stable with some improvement since prior visit -continue to monitor    Follow-up in 4 months or call earlier if needed   CC:  [place] provider: Dr. Edythe Lynn, Tera Helper, MD    I spent 38 minutes of face-to-face and  non-face-to-face time with patient and wife.  This included previsit chart review, lab review, study review, order entry, electronic health record documentation, patient education and discussion regarding hx of seizures and use of AED, recent episodes and likely etiology, seizure triggers and avoidance, difference between complex partial seizures and orthostasis, chronic tremor and continued monitoring and answered all other questions to patient and wifes satisfaction   Frann Rider, AGNP-BC  [facility] [address] [facility] [employer] [place], [place] [address]  Phone [phone] Fax [phone] Note: This document was prepared with digital dictation and possible smart phrase technology. Any transcriptional errors that result from this process are unintentional.

## 2021-03-09 NOTE — Patient Instructions (Addendum)
Increase keppra dose from 500mg  nightly to 750mg  nightly - please let us know if these events continue to happen   Please ensure you schedule a follow up visit with your cardiology    Follow up in 4 months or call earlier if needed

## 2021-03-10 NOTE — Progress Notes (Signed)
Chief Complaint  Patient presents with   Hypertension    6 month follow up, non fasting. Patient had flu shot with covid booster, pharmacist came to his house (son in law-works at [employer]). Shoulders are arthritic and are bothering him at the present time.    Patient presents for 6 month med check  He was seen last month with ongoing episodes of unresponsiveness and orthostasis. He has since seen neuro in follow-up and his Keppra dose was increased to 750mg . He hasn't started the higher dose, needed to be ordered, plans to pick up today. Denies any more spells since then. He was encouraged by myself and by neuro to increase fluid intake and cut back on alcohol. At his recent visit he reported having 2 beers/day, sometimes a mixed drink along with it. He was drinking only 2 glasses of water daily.  1-2 cups/coffee in the morning. He is currently drinking 2 glasses of water, and decaffeinated tea. He cut back to max of 1 beer/day, sometimes none.   Urine is yellow, but lighter than last time.  Still gets a little dizzy with standing, but improved.  He had also reported feeling unsteady.  B12 levels were on the lower end of normal. He was encouraged to start taking B12 oral supplement.  He is taking this. He reports the unsteadiness is still there, but improved. Lab Results  Component Value Date   VITAMINB12 282 02/18/2021    He is s/p surgery on R shoulder (rotator cuff) in the past (06/2011). Saw Dr. Berenice Primas. Now having L shoulder pain.  Some parts of the day are worse than others. Hurts to lay on it. Sometime has pain in the arm and wrist as well, other times is just the shoulder.  Only mild neck pain. Denies numbness/tingling (just the L thumb since an accident/nerve injury).   H/o Stroke (10/2014) and seizures. Last neuro visit earlier this week, as reported above. Keppra increased to 750mg , treating possible complex partial seizures (episodes of altered awareness with  speech difficulties).   He remains on Eliquis for anticoagulation related to afib for prevention of further strokes.  He denies any bleeding, bruising. He is under the care of [name]. He was last seen in 07/2020, due for follow-up now. He had appt 10/7, but left without being seen.  He had EKG, was told to wait.  Waited an hour, couldn't find anyone in the office to talk to, so he left. Last echo was 07/2020, AV mod-severe stenosis.  Due for f/u visit and echo now.   He denies any chest pain or DOE. He has hypertension, with significant white coat component, and also orthostasis as noted at his recent visit here. BP's have been running <100 to up to 140/50's-60's at home (didn't bring list).    Prostate cancer:  Previously monitored by [name] every 6 months.  He underwent internal urethrotomy, cystourethroscopy and circumcision on 02/15/17.  He has had ongoing issues with nocturia. He declined repeat cystoscopy as recommended by [name].  He is now under the care of [facility].  Currently not taking myrbetriq or sildosin (only took 1 dose of this, ended up in ER).   Found medication helped some with nocturia (myrbetriq).  He is scheduled for f/u in November. He continues to get up 4-5 times/night to void, denies daytime frequency.  Denies dysuria, hematuria. Lab Results  Component Value Date   PSA1 <0.1 09/08/2020    OSA and Pulmonary  hypertension:  He admits that he has not been using his CPAP, hasn't used in over 6-8 months.  Reasons relate to his urinary frequency, and found it aggravating to put in on and off.  IFG--He doesn't check his sugars.  He goes to the eye doctor regularly, for macular degeneration ([name], gets injections every 6-10 weeks).   He is under the care of podiatrist.  He denies any foot tingling or sores.  He does report that he feels like he is wearing a sock (when he isn't) on the R foot only. He has urinary frequency x several years  (related to prostate). He no longer has as much frequency during the day, now mainly only at night.  Denies polydipsia. A1c had been up to 6% when eating more junk (peanut butter crackers, cheese, cookies), improved with change in diet.  Lab Results  Component Value Date   HGBA1C 5.6 09/08/2020     Hyperlipidemia:  Compliant with medications (80mg  of lovastatin); denies side effects. Tries to follow a lowfat, low cholesterol diet, cut back on fried foods. Lipids were at goal on last check, when he reported eating beef (3x/week), a lot of seafood. Lab Results  Component Value Date   CHOL 154 09/08/2020   HDL 70 09/08/2020   LDLCALC 68 09/08/2020   TRIG 85 09/08/2020   CHOLHDL 2.2 09/08/2020      PMH, PSH, SH reviewed  Outpatient Encounter Medications as of 03/11/2021  Medication Sig Note   acetaminophen (TYLENOL ARTHRITIS PAIN) 650 MG CR tablet Take 1 tablet (650 mg total) by mouth every 8 (eight) hours as needed. (Patient taking differently: Take 1,300 mg by mouth in the morning and at bedtime.)    carvedilol (COREG) 6.25 MG tablet TAKE ONE TABLET TWICE DAILY WITH A MEAL    Cyanocobalamin (VITAMIN B 12 PO) Take 1,000 mcg by mouth.    ELIQUIS 5 MG TABS tablet TAKE ONE TABLET TWICE DAILY    levETIRAcetam 750 MG TB24 Take 1 tablet (750 mg total) by mouth at bedtime. 03/11/2021: Still taking 500mg , hasn't picked up yet   loratadine (CLARITIN) 10 MG tablet Take by mouth.    lovastatin (MEVACOR) 40 MG tablet TAKE TWO TABLETS EACH NIGHT AT BEDTIME    Multiple Vitamins-Minerals (PRESERVISION AREDS) CAPS Take 1 capsule by mouth 2 (two) times daily.     UNABLE TO FIND Bevacizumab(AVASTIN) chemo injection for the eye every 10 weeks    [DISCONTINUED] COVID-19 mRNA bivalent vaccine, Pfizer, (PFIZER COVID-19 VAC BIVALENT) injection Inject into the muscle.    No facility-administered encounter medications on file as of 03/11/2021.   No Known Allergies   ROS: no fever, chills, headaches, URI  symptoms.  Has some clear runny nose, no cough.  Denies chest pain, palpitations, shortness of breath, edema.  Denies n/v/heartburn, diarrhea. Has slight constipation. L shoulder pain per HPI.    PHYSICAL EXAM:  BP (!) 180/100   Pulse 76   Ht 5\' 11"  (1.803 m)   Wt 149 lb 12.8 oz (67.9 kg)   BMI 20.89 kg/m  Wt Readings from Last 3 Encounters:  03/11/21 149 lb 12.8 oz (67.9 kg)  03/09/21 152 lb (68.9 kg)  03/06/21 153 lb (69.4 kg)   182/94 on repeat by MD  Orthostatics by nurse: 210/150 P 80 lying; 200/120 P 84 sitting, 164/90 P 76 standing.  Frail appearing elderly male, in no distress, slightly pale. HEENT: conjunctiva and sclera are clear, EOMI, wearing mask Neck: no lymphadenopathy or thyromegaly Heart:  Sounds fairly regular.  3/6 harsh systolic murmur noted throughout, loudest at apex and RUSB. Lungs: clear bilaterally Back: no spinal or CVA tenderness Abdomen: Soft, nontender.  Central obesity is notably less prominent. Extremities: no edema, normal pulses.  Shoulders:  He has significant limitations of ROM of the R shoulder (60 degrees abduction, 80 forward flexion, internal rotation ok). This isn't painful, just limited ROM On the left, he had almost full ROM, but had slight discomfort with movement.  He had normal strength, mild discomfort with certain motions, but not specific for tendonitis, no e/o tear. Neuro: alert and oriented.  normal strength, sensation in UE's. Resting tremor noted.  Normal, slow gait Psych: normal mood, affect, hygiene and grooming.     ASSESSMENT/PLAN:  Chronic left shoulder pain - getting worse, hard to sleep on L at night.  Declines XR today. Refer to ortho in [place] for evaluation per pt request. Frozen shoulder on the R - Plan: Ambulatory referral to Orthopedic Surgery  Orthostatic dizziness - pt remains orthostatic by BP drop (not pulse). Urine is less concentrated. Encouraged adequate fluid intake (more water!), and cont to limit  ETOH - Plan: POCT Urinalysis DIP (Proadvantage Device)  Essential hypertension, benign - white coat component, with normal BP's per pt report. Didn't bring list.  To r/s cardiology visit and bring list of home BP's  Seizure disorder ([place]) - pt will increase Keppra dose per neuro, picking up new rx today  Pulmonary hypertension ([place]) - Pt has OSA and is noncompliant with CPAP use.  Encouraged him to get nocturia adequately treated so that he can resume use of CPAP  Personal history of prostate cancer - last PSA was good. He is scheduled to f/u with urologist next month  Urinary frequency - nocturia.  To drink more water during day, limit fluids at night. f/u with urologist as scheduled (?restart myrbetriq?)  Paroxysmal atrial fibrillation ([place]) - compliant with anticoagulant. in NSR today. 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 L shoulder pain--refer to ortho in [place] (declines x-rays in [place])  Reminded to get Tdap from pharmcy  No labs needed (just done last month); A1c normal in 08/2020, can be done once yearly  Reschedule f/u with Dr. Arida/cardiology  Has appt with our embedded pharmacist in 04/2021. Schedule AWV 6 mos

## 2021-03-11 ENCOUNTER — Encounter: Payer: Self-pay | Admitting: Family Medicine

## 2021-03-11 ENCOUNTER — Other Ambulatory Visit: Payer: Self-pay

## 2021-03-11 ENCOUNTER — Ambulatory Visit (INDEPENDENT_AMBULATORY_CARE_PROVIDER_SITE_OTHER): Payer: Medicare Other | Admitting: Family Medicine

## 2021-03-11 VITALS — BP 164/90 | HR 76 | Ht 71.0 in | Wt 149.8 lb

## 2021-03-11 DIAGNOSIS — M25512 Pain in left shoulder: Secondary | ICD-10-CM | POA: Diagnosis not present

## 2021-03-11 DIAGNOSIS — R350 Frequency of micturition: Secondary | ICD-10-CM | POA: Diagnosis not present

## 2021-03-11 DIAGNOSIS — I10 Essential (primary) hypertension: Secondary | ICD-10-CM | POA: Diagnosis not present

## 2021-03-11 DIAGNOSIS — R42 Dizziness and giddiness: Secondary | ICD-10-CM

## 2021-03-11 DIAGNOSIS — G40909 Epilepsy, unspecified, not intractable, without status epilepticus: Secondary | ICD-10-CM

## 2021-03-11 DIAGNOSIS — I480 Paroxysmal atrial fibrillation: Secondary | ICD-10-CM | POA: Diagnosis not present

## 2021-03-11 DIAGNOSIS — I272 Pulmonary hypertension, unspecified: Secondary | ICD-10-CM | POA: Diagnosis not present

## 2021-03-11 DIAGNOSIS — Z8546 Personal history of malignant neoplasm of prostate: Secondary | ICD-10-CM

## 2021-03-11 DIAGNOSIS — G8929 Other chronic pain: Secondary | ICD-10-CM | POA: Diagnosis not present

## 2021-03-11 DIAGNOSIS — I350 Nonrheumatic aortic (valve) stenosis: Secondary | ICD-10-CM

## 2021-03-11 LAB — POCT URINALYSIS DIP (PROADVANTAGE DEVICE)
Bilirubin, UA: NEGATIVE
Blood, UA: NEGATIVE
Glucose, UA: NEGATIVE mg/dL
Ketones, POC UA: NEGATIVE mg/dL
Leukocytes, UA: NEGATIVE
Nitrite, UA: NEGATIVE
Protein Ur, POC: NEGATIVE mg/dL
Specific Gravity, Urine: 1.015
Urobilinogen, Ur: NEGATIVE
pH, UA: 6 (ref 5.0–8.0)

## 2021-03-11 NOTE — Patient Instructions (Addendum)
Please call to reschedule the cardiology appointment.  I believe you are due for another echocardiogram.  Please discuss your night time urinary frequency with your urologist (? If you should restart myrbetriq vs other medication).  If/when your nighttime bathroom trips are less often, I'd really like for you to restart use of CPAP every night to treat your sleep apnea.  Please try and increase your water intake to 6-8 glasses per day--the goal is to have very pale/clear urine.  High fiber diet and more water will also help prevent constipation.  Please get your TdaP from the pharmacy.

## 2021-03-13 ENCOUNTER — Encounter: Payer: Self-pay | Admitting: Cardiovascular Disease

## 2021-03-19 NOTE — Progress Notes (Signed)
I agree with the above plan 

## 2021-03-24 ENCOUNTER — Other Ambulatory Visit: Payer: Self-pay

## 2021-03-24 ENCOUNTER — Encounter: Payer: Self-pay | Admitting: Physician Assistant

## 2021-03-24 ENCOUNTER — Ambulatory Visit (INDEPENDENT_AMBULATORY_CARE_PROVIDER_SITE_OTHER): Payer: Medicare Other | Admitting: Physician Assistant

## 2021-03-24 VITALS — BP 140/100 | HR 74 | Ht 71.0 in | Wt 151.1 lb

## 2021-03-24 DIAGNOSIS — I350 Nonrheumatic aortic (valve) stenosis: Secondary | ICD-10-CM | POA: Diagnosis not present

## 2021-03-24 DIAGNOSIS — I6522 Occlusion and stenosis of left carotid artery: Secondary | ICD-10-CM

## 2021-03-24 DIAGNOSIS — I480 Paroxysmal atrial fibrillation: Secondary | ICD-10-CM | POA: Diagnosis not present

## 2021-03-24 DIAGNOSIS — I2721 Secondary pulmonary arterial hypertension: Secondary | ICD-10-CM | POA: Diagnosis not present

## 2021-03-24 DIAGNOSIS — I10 Essential (primary) hypertension: Secondary | ICD-10-CM | POA: Diagnosis not present

## 2021-03-24 DIAGNOSIS — E785 Hyperlipidemia, unspecified: Secondary | ICD-10-CM

## 2021-03-24 MED ORDER — FUROSEMIDE 20 MG PO TABS: 20.0000 mg | ORAL_TABLET | Freq: Every day | ORAL | 0 refills | Status: DC

## 2021-03-24 NOTE — Patient Instructions (Addendum)
We are going to get an echo to look at your aortic valve. Right now, it does not need replaced, so we will continue to monitor it and let you know if it ever needs replaced.    Try to keep salt under 2 grams per day   Medication Instructions:  Your physician has recommended you make the following change in your medication:   TAKE Furosemide 20 mg for 4 days  *If you need a refill on your cardiac medications before your next appointment, please call your pharmacy*   Lab Work: BMET today and then at next appointment in 2 weeks.  If you have labs (blood work) drawn today and your tests are completely normal, you will receive your results only by: [facility] (if you have MyChart) OR A paper copy in the mail If you have any lab test that is abnormal or we need to change your treatment, we will call you to review the results.   Testing/Procedures: Your physician has requested that you have an echocardiogram. Echocardiography is a painless test that uses sound waves to create images of your heart. It provides your doctor with information about the size and shape of your heart and how well your heart's chambers and valves are working. This procedure takes approximately one hour. There are no restrictions for this procedure.    Follow-Up: At [facility], you and your health needs are our priority.  As part of our continuing mission to provide you with exceptional heart care, we have created designated Provider Care Teams.  These Care Teams include your primary Cardiologist (physician) and Advanced Practice Providers (APPs -  Physician Assistants and Nurse Practitioners) who all work together to provide you with the care you need, when you need it.   Your next appointment:   2 week(s)  The format for your next appointment:   In Person  Provider:   Christell Faith, PA-C

## 2021-03-24 NOTE — Progress Notes (Signed)
Office Visit    Patient Name: Jeffrey [name] Date of Encounter: 03/24/2021  PCP:  Rita Ohara, [place]  Cardiologist:  Kathlyn Sacramento, MD  Advanced Practice Provider:  No care team member to display Electrophysiologist:  None [id number]   Chief Complaint    Chief Complaint  Patient presents with   Other    6 month f/u c/o elevated BP. Meds reviewed verbally with pt.    87 y.o. male with history of paroxysmal atrial fibrillation, aortic stenosis, hyperlipidemia, TIA, moderate left carotid stenosis, sleep apnea, borderline DM2, CVA 10/2014, [facility] spotted fever treated with doxycycline, orthostasis symptoms, previous history of tobacco use, and here today for follow-up of BP.    Past Medical History    Past Medical History:  Diagnosis Date   Acute CVA (cerebrovascular accident) ([place])    Acute encephalopathy 11/27/2014   Aortic atherosclerosis ([place]) 11/25/2014   Atrial fibrillation ([place])    a. 12/2018 Zio: Sinus rhythm, avg rate 71. 2 runs of VT (max 17 beats). 27 runs SVT (max 14 beats). Occas PACs/PVCs. No high grade AV block/pauses.   Chronic combined systolic (congestive) and diastolic (congestive) heart failure ([place])    a. 10/2014 Echo: EF 40-45%; b. 03/2018 Echo: EF 55-60%; c. 12/2018 Echo: EF 55-60%, mild conc LVH. Nl RV fxn. RVSP 51.44mmHg. Mildly dil LA. Mod AS w/ severe Ca2+; d. 12/2019 Echo: EF 50-55%, mild LVH, gr2 DD. Sev elev PASP. Mild MR, mild to mod TR. Triv AI, mod to sev AS (area 0.77cm^2, mean grad 4mmHg. Vmax 2.47m/s).   Diabetes mellitus    Hypercholesteremia    Hypertension    Impaired fasting glucose    Macular degeneration    Moderate to severe aortic stenosis    a. 12/2018 Echo: EF 55-60%, Mod AS w/ sev AoV Ca2+; b. 12/2019 Echo: EF 50-55%. Mod to sev AS (area 0.77cm^2, mean grad 70mmHg. Vmax 2.85m/s).   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 Hilarie Fredrickson)   [facility] spotted fever) 10/2014   (hosp with FUO, encephalopathy)   Rotator cuff tear, right 10/2010   supraspinatous and infraspinatous   Seizure disorder ([place]) 07/25/2019   TIA (transient ischemic attack) 10/02/13   hosp at [facility] x 1 night   Type 2 diabetes mellitus with microalbuminuria or microproteinuria 01/15/2014   Past Surgical History:  Procedure Laterality Date   CATARACT EXTRACTION Right 01/2017   CIRCUMCISION  01/2017   [name]   COLONOSCOPY N/A 11/28/2012   Procedure: COLONOSCOPY;  Surgeon: Jerene Bears, MD;  Location: [facility] ENDOSCOPY;  Service: Gastroenterology;  Laterality: N/A;   INTERNAL URETHROTOMY  01/27/2016   WF   prostate seed implant     ROTATOR CUFF REPAIR  06/16/2011   right (Dr. Berenice Primas)   TONSILLECTOMY AND ADENOIDECTOMY  age 89    Allergies  No Known Allergies  History of Present Illness    Jeffrey [name] is a 87 y.o. male with PMH as above.  He quit smoking 30 years ago.  He reports current alcohol use.  MPI 12/2014 without ischemia.  Echo 03/2018 with normal EF, moderate aortic stenosis with mean gradient of 21 mmHg and valve area of 0.95.  Pulmonary pressure moderately elevated at 56 mmHg.  He has known history of labile hypertension  with previous BP elevated, requiring switch from metoprolol to Coreg and addition of amlodipine.  He has whitecoat syndrome.  He had an ED visit 11/2018 with possible syncopal episode and sustained a face laceration.  Due to orthostatic hypotension, he decreased some of his antihypertensive meds.  14-day Zio patch showed NSR with 2 runs of VT and 27 runs of SVT the longest of which lasted 14 beats.  Given ectopy, lisinopril and amlodipine discontinued to uptitrate Coreg.  He was seen 12/2019 and hypertensive with decision to continue his current medications given reasonable home BP and known orthostatic hypotension.   Subsequent echo showed normal LV SF, G2 DD, moderate pulmonary hypertension with peak systolic pulmonary pressure 50 mmHg.  Also noted was mild to moderate MR and moderate to severe AAS with mean gradient of 20.5 mmHg and valve area of 0.91.    He was last seen in office by his primary cardiologist 08/12/2020, at which time he denied chest pain but reported a gradual decline in functional capacity with increased fatigue.  He had increased tremors.  He was not driving due to poor vision.  He had only mild dizziness.  Amlodipine was started at low-dose 2.5 mg and subsequently self discontinued by the patient due to lower pressures at home.  Today, 03/24/2021, he returns to clinic with BP still elevated at 140/100.  He reports his BP at home has been improved.  He notes SBP at home usually 140 or less with DBP at home 60-100.  He denies any chest pain or shortness of breath at rest or with exertion, though he is very sedentary.  He reports ongoing decline in functional capacity with increased fatigue.  He still has tremors.  He has some arthritis pain in his arms.  He did walk the dog and is able to go short distances.  His activity has been significantly reduced with his arthritis.  He reports occasional very transient dizziness.  He denies any syncope or falls.  He has been drinking more alcohol, usually beer. He states one doctor told him to drink alcohol to help reduce tremors with recommendation for cessation today.   He has reportedly already been working to cut back from 2 or more beers to 1 beer.  He reports a good appetite.  He did not sleep well last night, as he was up all night with sx of orthopnea.  Salt and fluid restrictions reviewed.  He cannot tolerate his CPAP.  As above, he has discontinued his amlodipine.  He reports compliance with anticoagulation.  He does not recall his diagnosis of aortic stenosis with this reviewed in great detail with both the patient and wife, as they are unfamiliar with  dx and the work-up if AS severity increases on repeat echo.  Home Medications   Current Outpatient Medications  Medication Instructions   acetaminophen (TYLENOL ARTHRITIS PAIN) 650 mg, Oral, Every 8 hours PRN   carvedilol (COREG) 6.25 MG tablet TAKE ONE TABLET TWICE DAILY WITH A MEAL   Cyanocobalamin (VITAMIN B 12 PO) 1,000 mcg, Oral   ELIQUIS 5 MG TABS tablet TAKE ONE TABLET TWICE DAILY   Levetiracetam 750 mg, Oral, Daily at bedtime   loratadine (CLARITIN) 10 MG tablet Oral   lovastatin (MEVACOR) 40 MG tablet TAKE TWO TABLETS EACH NIGHT AT BEDTIME   Multiple Vitamins-Minerals (PRESERVISION AREDS) CAPS 1 capsule, Oral, 2 times daily   UNABLE TO FIND Bevacizumab(AVASTIN) chemo injection for the eye every 10 weeks     Review of Systems  He reports decreased activity, fatigue, tremors, poor vision, mild dizziness, orthopnea.  He denies chest pain, palpitations, SOB,  n, v, syncope, edema, weight gain, or early satiety. All other systems reviewed and are otherwise negative except as noted above.  Physical Exam    VS:  BP (!) 140/100 (BP Location: Left Arm, Patient Position: Sitting, Cuff Size: Normal)   Pulse 74   Ht 5\' 11"  (1.803 m)   Wt 151 lb 2 oz (68.5 kg)   SpO2 98%   BMI 21.08 kg/m  , BMI Body mass index is 21.08 kg/m. GEN: Elderly male, in no acute distress, joined by his wife. HEENT: normal. Neck: Supple, JVP approximately 10 cm.  No carotid bruits, or masses. Cardiac: RRR, 3/6 systolic murmur appreciated throughout the exam. No rubs, or gallops. No clubbing, cyanosis.  2-3+ bilateral pitting edema.  Radials/DP/PT 2+ and equal bilaterally.  Respiratory:  Respirations regular and unlabored, right basilar crackles. GI: Firm, slightly distended, BS + x 4. MS: no deformity or atrophy. Skin: warm and dry, no rash. Neuro:  Strength and sensation are intact. Psych: Normal affect.  Accessory Clinical Findings    ECG personally reviewed by me today -NSR with PACs, RBBB, LVH,  74 bpm, QRS 140 ms, QTC 495 ms, poor R wave progression in lead III- no acute changes.  VITALS Reviewed today   Temp Readings from Last 3 Encounters:  11/12/20 97.9 F (36.6 C) (Oral)  09/03/19 97.9 F (36.6 C)  07/26/19 98 F (36.7 C) (Other (Comment))   BP Readings from Last 3 Encounters:  03/24/21 (!) 140/100  03/11/21 (!) 164/90  03/09/21 (!) 146/94   Pulse Readings from Last 3 Encounters:  03/24/21 74  03/11/21 76  03/09/21 68    Wt Readings from Last 3 Encounters:  03/24/21 151 lb 2 oz (68.5 kg)  03/11/21 149 lb 12.8 oz (67.9 kg)  03/09/21 152 lb (68.9 kg)     LABS  reviewed today   Lab Results  Component Value Date   WBC 7.9 02/18/2021   HGB 13.7 02/18/2021   HCT 41.2 02/18/2021   MCV 98 (H) 02/18/2021   PLT 280 02/18/2021   Lab Results  Component Value Date   CREATININE 1.03 02/18/2021   BUN 24 02/18/2021   NA 144 02/18/2021   K 4.6 02/18/2021   CL 103 02/18/2021   CO2 26 02/18/2021   Lab Results  Component Value Date   ALT 11 02/18/2021   AST 19 02/18/2021   ALKPHOS 39 (L) 02/18/2021   BILITOT 0.5 02/18/2021   Lab Results  Component Value Date   CHOL 154 09/08/2020   HDL 70 09/08/2020   LDLCALC 68 09/08/2020   TRIG 85 09/08/2020   CHOLHDL 2.2 09/08/2020    Lab Results  Component Value Date   HGBA1C 5.6 09/08/2020   Lab Results  Component Value Date   TSH 1.920 09/08/2020     STUDIES/PROCEDURES reviewed today   Echo 08/08/2020  1. Left ventricular ejection fraction, by estimation, is 55 to 60%. The  left ventricle has normal function. The left ventricle has no regional  wall motion abnormalities. There is mild left ventricular hypertrophy.  Left ventricular diastolic parameters  are consistent with Grade II diastolic dysfunction (pseudonormalization).   2. Right ventricular systolic function is normal. The right ventricular  size is normal. There is moderately elevated pulmonary artery systolic  pressure. The estimated right  ventricular systolic pressure is 48.1 mmHg.   3. Mild to moderate mitral valve  regurgitation.   4. Tricuspid valve regurgitation is mild to moderate.   5. There is severe calcifcation of the aortic valve. Aortic valve  regurgitation is not visualized. Moderate to severe aortic valve stenosis.  Aortic valve area, by VTI measures 0.91 cm. Aortic valve mean gradient  measures 20.5 mmHg. Aortic valve Vmax  measures 3.10 m/s.   Monitor 01/02/2019 Normal sinus rhythm with an average heart rate of 71 bpm. 2 runs of ventricular tachycardia noted the longest lasted 17 beats. 27 runs of supraventricular tachycardia noted the longest lasted 14 beats. Occasional PACs and rare PVCs. No significant pauses or high-grade AV block.  US carotids 12/23/2017 Final Interpretation:  Right Carotid: Velocities in the right ICA are consistent with a 1-39%  stenosis.  Left Carotid: Velocities in the left ICA are consistent with a 40-59%  stenosis.                Calcified plaque may obscure high velocities.  Vertebrals:  Bilateral vertebral arteries demonstrate antegrade flow.  Subclavians: Normal flow hemodynamics were seen in bilateral subclavian               arteries.     Assessment & Plan   Volume overload/HFpEF/PAH -- He reports some orthopnea with exam today consistent with volume overload.  Given his known aortic stenosis, recommend very gentle/cautious diuresis.  We will plan for 4 days only of Lasix 20 mg.  BMET today and in 2 weeks/follow-up.  Further recommendations as indicated following labs and with reassessment of patient volume status at follow-up.  Given his overload, recommend repeat echo with previous 07/2020 echo as above, and given recommendation for updated echo every 6 months for aortic valve stenosis.  Essential hypertension - BP uncontrolled.  At his previous visit, amlodipine was resumed at 2.5 mg once per day.  This has subsequently fallen off of his medication list with EMR  indicating this was due to patient report of lower BP at home.  He is volume up today with recommendation for very gentle diuresis, given his known aortic valve stenosis.  Suspect improvement of BP with diuresis.   If BP still elevated after diuresis, recommend restart of amlodipine 2.5 mg once per day.  Continue Coreg.  Aortic valve stenosis --Significant murmur appreciated on exam.  Moderate by most recent echo.  Previous uncertainty regarding patient's candidacy for TAVR, though pt  was open for evaluation if needed.  Patient today does not recall this conversation. We reviewed his dx of aortic valve stenosis and the reason for a repeat echo is recommended q 6 months (ordered today).  We reviewed the steps if his valvular dz worsens with pt/wife understanding. Will get echo. Recommend BP/HR/LDL control.  Given his aortic stenosis, we will be very careful with diuresis at this time.  Paroxysmal atrial fibrillation --Currently NSR with PACs.  Continue current Coreg with current rate well controlled.  Continue Eliquis for long-term anticoagulation.  He reports compliance with anticoagulation and denies any signs or symptoms of bleeding.  [place] precautions reviewed.  HLD --Continue statin.  Left carotid stenosis --Followed by vascular surgery without recent evaluation.  Reassess at RTC.   Medication changes: 4 days only of Lasix 20 mg daily Labs ordered: BMET today and at RTC Studies / Imaging ordered: Echo (aortic stenosis, volume overload) Future considerations: ? Severity of aortic stenosis by echo, reassessment of volume status,?  Carotids,?  BP improvement Disposition: RTC 2 weeks  *Please be aware that the above documentation was completed voice  recognition software and may contain dictation errors.    Arvil Chaco, PA-C 03/24/2021

## 2021-03-25 DIAGNOSIS — M25511 Pain in right shoulder: Secondary | ICD-10-CM | POA: Diagnosis not present

## 2021-03-25 DIAGNOSIS — M12811 Other specific arthropathies, not elsewhere classified, right shoulder: Secondary | ICD-10-CM | POA: Diagnosis not present

## 2021-03-25 DIAGNOSIS — M7582 Other shoulder lesions, left shoulder: Secondary | ICD-10-CM | POA: Diagnosis not present

## 2021-03-25 DIAGNOSIS — M19011 Primary osteoarthritis, right shoulder: Secondary | ICD-10-CM | POA: Diagnosis not present

## 2021-03-25 DIAGNOSIS — M25512 Pain in left shoulder: Secondary | ICD-10-CM | POA: Diagnosis not present

## 2021-03-25 DIAGNOSIS — M75101 Unspecified rotator cuff tear or rupture of right shoulder, not specified as traumatic: Secondary | ICD-10-CM | POA: Diagnosis not present

## 2021-03-25 DIAGNOSIS — M7522 Bicipital tendinitis, left shoulder: Secondary | ICD-10-CM | POA: Diagnosis not present

## 2021-03-25 LAB — BASIC METABOLIC PANEL
BUN/Creatinine Ratio: 23 (ref 10–24)
BUN: 20 mg/dL (ref 8–27)
CO2: 26 mmol/L (ref 20–29)
Calcium: 9.6 mg/dL (ref 8.6–10.2)
Chloride: 102 mmol/L (ref 96–106)
Creatinine, Ser: 0.87 mg/dL (ref 0.76–1.27)
Glucose: 117 mg/dL — ABNORMAL HIGH (ref 70–99)
Potassium: 4.6 mmol/L (ref 3.5–5.2)
Sodium: 141 mmol/L (ref 134–144)
eGFR: 84 mL/min/{1.73_m2} (ref >59)

## 2021-03-26 ENCOUNTER — Encounter: Payer: Self-pay | Admitting: Physician Assistant

## 2021-03-27 ENCOUNTER — Telehealth: Payer: Self-pay | Admitting: *Deleted

## 2021-03-27 NOTE — Telephone Encounter (Signed)
Attempted to call pt and pt's wife (DPR ok).  No answer and no vm set up on either phone.  Results have been released to [place].

## 2021-03-27 NOTE — Telephone Encounter (Signed)
Attempted to reach pt and wife, unsuccessful Cell VM, not set up on either phone Home phone appears to have no machine to LM

## 2021-03-27 NOTE — Telephone Encounter (Signed)
-----   Message from Arvil Chaco, PA-C sent at 03/26/2021  4:13 PM EDT ----- Kidney function stable and electrolytes at goal. No changes from clinic. Repeat BMET at return to clinic.

## 2021-03-30 NOTE — Telephone Encounter (Signed)
The patient has been notified of the result and verbalized understanding.  All questions (if any) were answered.     

## 2021-04-02 DIAGNOSIS — H35361 Drusen (degenerative) of macula, right eye: Secondary | ICD-10-CM | POA: Diagnosis not present

## 2021-04-02 DIAGNOSIS — H3554 Dystrophies primarily involving the retinal pigment epithelium: Secondary | ICD-10-CM | POA: Diagnosis not present

## 2021-04-02 DIAGNOSIS — H35372 Puckering of macula, left eye: Secondary | ICD-10-CM | POA: Diagnosis not present

## 2021-04-02 DIAGNOSIS — H353231 Exudative age-related macular degeneration, bilateral, with active choroidal neovascularization: Secondary | ICD-10-CM | POA: Diagnosis not present

## 2021-04-08 ENCOUNTER — Other Ambulatory Visit: Payer: Self-pay

## 2021-04-08 ENCOUNTER — Other Ambulatory Visit (INDEPENDENT_AMBULATORY_CARE_PROVIDER_SITE_OTHER): Payer: Medicare Other

## 2021-04-08 DIAGNOSIS — I10 Essential (primary) hypertension: Secondary | ICD-10-CM

## 2021-04-08 DIAGNOSIS — Z79899 Other long term (current) drug therapy: Secondary | ICD-10-CM

## 2021-04-08 DIAGNOSIS — E1151 Type 2 diabetes mellitus with diabetic peripheral angiopathy without gangrene: Secondary | ICD-10-CM | POA: Diagnosis not present

## 2021-04-09 LAB — BASIC METABOLIC PANEL
BUN/Creatinine Ratio: 25 — ABNORMAL HIGH (ref 10–24)
BUN: 27 mg/dL (ref 8–27)
CO2: 23 mmol/L (ref 20–29)
Calcium: 9.2 mg/dL (ref 8.6–10.2)
Chloride: 101 mmol/L (ref 96–106)
Creatinine, Ser: 1.1 mg/dL (ref 0.76–1.27)
Glucose: 77 mg/dL (ref 70–99)
Potassium: 4.3 mmol/L (ref 3.5–5.2)
Sodium: 141 mmol/L (ref 134–144)
eGFR: 65 mL/min/{1.73_m2} (ref >59)

## 2021-04-12 NOTE — Progress Notes (Signed)
Cardiology Office Note    Date:  04/13/2021   ID:  Jeffrey [name] 11-07-1932, MRN [id number]  PCP:  Jeffrey Ohara, MD  Cardiologist:  Jeffrey Sacramento, MD  Electrophysiologist:  None   Chief Complaint: Follow up  History of Present Illness:   Jeffrey [name] is a 87 y.o. male with history of PAF, aortic stenosis, pulmonary hypertension, TIA, CVA in 10/2014, labile hypertension with a component of whitecoat syndrome, hyperlipidemia, moderate left-sided carotid artery stenosis, borderline diabetes, [facility] Spotted Fever, prior tobacco use, and sleep apnea who presents for follow-up of volume overload.  Nuclear stress test in 12/2014 showed no evidence of ischemia with a normal EF.  Echo in 03/2018 showed normal LV systolic function, moderate aortic stenosis with a mean gradient of 21 mmHg and a valve area of 0.95 cm.  Pulmonary pressure was moderately elevated at 56 mmHg.  He was seen in the ED in 11/2018 with a possible syncopal episode with low blood pressure.  He had a facial laceration and was instructed to hold Eliquis for 2 days.  He had significant orthostatic hypotension that required decreasing of some of his antihypertensive medications.  Subsequent Zio patch showed sinus rhythm with an average rate of 71 beats p.m.  There were 2 runs of NSVT with the longest episode lasting 17 beats.  In addition, there were 27 runs of SVT with the longest episode lasting 14 beats.  Given the frequent ectopy, it was decided to discontinue lisinopril and amlodipine with recommendation to uptitrate carvedilol.  Most recent echo performed on 08/08/2020 demonstrated an EF of 55 to 60%, no regional wall motion abnormalities, mild LVH, grade 2 diastolic dysfunction, normal RV systolic function and ventricular cavity size, moderately elevated PASP estimated at 50.7 mmHg, mild to moderate mitral valve regurgitation, mild to moderate tricuspid valve regurgitation, severe calcification of the aortic valve with  moderate to severe aortic valve stenosis with a mean gradient of 20.5 mmHg and a valve area by VTI measuring 0.91 cm.  He was seen in the ED in 10/2020 with near syncope following his initial dose of silodosin for BPH as well as performing yard work.  High-sensitivity troponin was negative.  CT head showed no acute intracranial abnormality.  EKG showed sinus rhythm with a known right bundle branch block.  There was question if this episode was in relation to his new medication, and he was advised to follow-up as an outpatient.  He was most recently seen in the office on 03/24/2021 and felt to be volume overloaded.  It was recommended he take 4 days of Lasix 20 mg daily, and echo was ordered which is scheduled for 04/2021.  His weight was down 4 pounds when compared to his visit in 07/2020.  He comes in accompanied by his wife today and is doing well from a cardiac perspective.  No chest pain, dyspnea, palpitations, dizziness, presyncope, or syncope.  No lower extremity swelling, abdominal distention, orthopnea, PND, early satiety.  Essential tremor is stable.  He indicates he feels at his baseline.  He reports stable and well-controlled blood pressure at home.  He is tolerating medications without issues.  No falls, hematochezia, or melena.  He would like to postpone his echo until after Christmas.  Otherwise, he does not have any issues or concerns.   Labs independently reviewed: 03/2021 - BUN 27, serum creatinine 1.1, potassium 4.3 01/2021 - albumin 4.7, AST/ALT normal, Hgb 13.7, PLT 280 08/2020 - TC 154, TG 85, HDL 70,  LDL 68, TSH normal, A1c 5.6  Past Medical History:  Diagnosis Date   Acute CVA (cerebrovascular accident) ([place])    Acute encephalopathy 11/27/2014   Aortic atherosclerosis ([place]) 11/25/2014   Atrial fibrillation ([place])    a. 12/2018 Zio: Sinus rhythm, avg rate 71. 2 runs of VT (max 17 beats). 27 runs SVT (max 14 beats). Occas PACs/PVCs. No high grade AV block/pauses.   Chronic combined  systolic (congestive) and diastolic (congestive) heart failure ([place])    a. 10/2014 Echo: EF 40-45%; b. 03/2018 Echo: EF 55-60%; c. 12/2018 Echo: EF 55-60%, mild conc LVH. Nl RV fxn. RVSP 51.18mmHg. Mildly dil LA. Mod AS w/ severe Ca2+; d. 12/2019 Echo: EF 50-55%, mild LVH, gr2 DD. Sev elev PASP. Mild MR, mild to mod TR. Triv AI, mod to sev AS (area 0.77cm^2, mean grad 65mmHg. Vmax 2.73m/s).   Diabetes mellitus    Hypercholesteremia    Hypertension    Impaired fasting glucose    Macular degeneration    Moderate to severe aortic stenosis    a. 12/2018 Echo: EF 55-60%, Mod AS w/ sev AoV Ca2+; b. 12/2019 Echo: EF 50-55%. Mod to sev AS (area 0.77cm^2, mean grad 1mmHg. Vmax 2.79m/s).   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 Hilarie Fredrickson)   [facility] spotted fever) 10/2014   (hosp with FUO, encephalopathy)   Rotator cuff tear, right 10/2010   supraspinatous and infraspinatous   Seizure disorder ([place]) 07/25/2019   TIA (transient ischemic attack) 10/02/13   hosp at [facility] x 1 night   Type 2 diabetes mellitus with microalbuminuria or microproteinuria 01/15/2014    Past Surgical History:  Procedure Laterality Date   CATARACT EXTRACTION Right 01/2017   CIRCUMCISION  01/2017   [name]   COLONOSCOPY N/A 11/28/2012   Procedure: COLONOSCOPY;  Surgeon: Jerene Bears, MD;  Location: [facility] ENDOSCOPY;  Service: Gastroenterology;  Laterality: N/A;   INTERNAL URETHROTOMY  01/27/2016   WF   prostate seed implant     ROTATOR CUFF REPAIR  06/16/2011   right (Dr. Berenice Primas)   TONSILLECTOMY AND ADENOIDECTOMY  age 40    Current Medications: Current Meds  Medication Sig   acetaminophen (TYLENOL ARTHRITIS PAIN) 650 MG CR tablet Take 1 tablet (650 mg total) by mouth every 8 (eight) hours as needed. (Patient taking differently: Take 1,300 mg by  mouth in the morning and at bedtime.)   carvedilol (COREG) 6.25 MG tablet TAKE ONE TABLET TWICE DAILY WITH A MEAL   Cyanocobalamin (VITAMIN B 12 PO) Take 1,000 mcg by mouth.   ELIQUIS 5 MG TABS tablet TAKE ONE TABLET TWICE DAILY   furosemide (LASIX) 20 MG tablet Take 1 tablet (20 mg total) by mouth daily. Take 1 tablet for 4 days   levETIRAcetam 750 MG TB24 Take 1 tablet (750 mg total) by mouth at bedtime.   loratadine (CLARITIN) 10 MG tablet Take 10 mg by mouth daily as needed.   lovastatin (MEVACOR) 40 MG tablet TAKE TWO TABLETS EACH NIGHT AT BEDTIME   Multiple Vitamins-Minerals (PRESERVISION AREDS) CAPS Take 1 capsule by mouth 2 (two) times daily.    UNABLE TO FIND Bevacizumab(AVASTIN) chemo injection for the eye every 10 weeks    Allergies:   Patient has no known allergies.   Social History   Socioeconomic History   Marital status: Married  Spouse name: [name]   Number of children: 4   Years of education: Not on file   Highest education level: Not on file  Occupational History   Occupation: Retired  Tobacco Use   Smoking status: Former    Packs/day: 1.00    Years: 20.00    Pack years: 20.00    Types: Cigarettes    Quit date: 05/31/1980    Years since quitting: 40.8   Smokeless tobacco: Never  Vaping Use   Vaping Use: Never used  Substance and Sexual Activity   Alcohol use: Yes    Alcohol/week: 14.0 standard drinks    Types: 14 Cans of beer per week    Comment: 2 cans of beer most days of the week   Drug use: No   Sexual activity: Not Currently    Partners: Female    Comment: issues with ED  Other Topics Concern   Not on file  Social History Narrative   Married. Retired Engineer, structural.  2 sons, 2 daughters (all in [place]), 15 grandchildren, 9 great grandchildren   Lives with wife, 1 dog.   Right Handed   Drinks 2 cups of caffeine daily   Social Determinants of Health   Financial Resource Strain: Not on file  Food Insecurity: Not on file  Transportation Needs:  Not on file  Physical Activity: Not on file  Stress: Not on file  Social Connections: Not on file     Family History:  The patient's family history includes Cancer in his daughter; Congestive Heart Failure in his grandchild; Dementia in his mother; Diabetes in his daughter and mother; Heart disease in his mother and son; Heart disease (age of onset: 4) in his father; Hypertension in his mother; Stroke in his sister.  ROS:   Review of Systems  Constitutional:  Positive for malaise/fatigue. Negative for chills, diaphoresis, fever and weight loss.  HENT:  Negative for congestion.   Eyes:  Negative for discharge and redness.  Respiratory:  Negative for cough, sputum production, shortness of breath and wheezing.   Cardiovascular:  Negative for chest pain, palpitations, orthopnea, claudication, leg swelling and PND.  Gastrointestinal:  Negative for abdominal pain, blood in stool, heartburn, melena, nausea and vomiting.  Musculoskeletal:  Negative for falls and myalgias.  Skin:  Negative for rash.  Neurological:  Positive for tremors. Negative for dizziness, tingling, sensory change, speech change, focal weakness, loss of consciousness and weakness.  Endo/Heme/Allergies:  Does not bruise/bleed easily.  Psychiatric/Behavioral:  Negative for substance abuse. The patient is not nervous/anxious.   All other systems reviewed and are negative.   EKGs/Labs/Other Studies Reviewed:    Studies reviewed were summarized above. The additional studies were reviewed today:  2D echo scheduled for 05/26/2021: __________  2D echo 07/2020: 1. Left ventricular ejection fraction, by estimation, is 55 to 60%. The  left ventricle has normal function. The left ventricle has no regional  wall motion abnormalities. There is mild left ventricular hypertrophy.  Left ventricular diastolic parameters  are consistent with Grade II diastolic dysfunction (pseudonormalization).   2. Right ventricular systolic function  is normal. The right ventricular  size is normal. There is moderately elevated pulmonary artery systolic  pressure. The estimated right ventricular systolic pressure is 22.6 mmHg.   3. Mild to moderate mitral valve regurgitation.   4. Tricuspid valve regurgitation is mild to moderate.   5. There is severe calcifcation of the aortic valve. Aortic valve  regurgitation is not visualized. Moderate to severe aortic valve stenosis.  Aortic valve area, by VTI measures 0.91 cm. Aortic valve mean gradient  measures 20.5 mmHg. Aortic valve Vmax  measures 3.10 m/s.  __________  2D echo 12/2019: 1. Left ventricular ejection fraction, by estimation, is 50 to 55%. The  left ventricle has low normal function. Left ventricular endocardial  border not optimally defined to evaluate regional wall motion. There is  mild left ventricular hypertrophy. Left  ventricular diastolic parameters are consistent with Grade II diastolic  dysfunction (pseudonormalization). Elevated left atrial pressure.   2. Right ventricular systolic function is normal. The right ventricular  size is normal. There is severely elevated pulmonary artery systolic  pressure.   3. The mitral valve is normal in structure. Mild mitral valve  regurgitation. No evidence of mitral stenosis.   4. Tricuspid valve regurgitation is mild to moderate.   5. The aortic valve is tricuspid. Aortic valve regurgitation is trivial.  Moderate to severe aortic valve stenosis. Aortic valve area, by VTI  measures 0.77 cm. Aortic valve mean gradient measures 23.0 mmHg. Aortic  valve Vmax measures 2.97 m/s.   6. The inferior vena cava is dilated in size with <50% respiratory  variability, suggesting right atrial pressure of 15 mmHg.  __________  Elwyn Reach patch 12/2018: Normal sinus rhythm with an average heart rate of 71 bpm. 2 runs of ventricular tachycardia noted the longest lasted 17 beats. 27 runs of supraventricular tachycardia noted the longest lasted 14  beats. Occasional PACs and rare PVCs. No significant pauses or high-grade AV block. __________  2D echo 12/2018: 1. The left ventricle has normal systolic function, with an ejection  fraction of 55-60%. The cavity size was normal. There is mild concentric  left ventricular hypertrophy. Left ventricular diastolic Doppler  parameters are consistent with  pseudonormalization.   2. The right ventricle has normal systolic function. The cavity was  normal. There is no increase in right ventricular wall thickness. Right  ventricular systolic pressure is moderately elevated with an estimated  pressure of 51.5 mmHg.   3. Left atrial size was mildly dilated.   4. The mitral valve is grossly normal.   5. The tricuspid valve is grossly normal.   6. The aortic valve is abnormal. Moderate thickening of the aortic valve.  Severe calcifcation of the aortic valve. Moderate stenosis of the aortic  valve.   7. The aorta is not well visualized unless otherwise noted. __________  2D echo 03/2018: - Left ventricle: The cavity size was normal. There was mild    concentric hypertrophy. Systolic function was normal. The    estimated ejection fraction was in the range of 55% to 60%. Wall    motion was normal; there were no regional wall motion    abnormalities. Left ventricular diastolic function parameters    were normal.  - Aortic valve: There was mild to moderate stenosis by mean    gradient and peak velocity, moderate by estimated AVA. Peak    velocity (S): 290 cm/s. Mean gradient (S): 21 mm Hg. Valve area    (VTI): 0.94 cm^2.  - Left atrium: The atrium was mildly dilated.  - Right ventricle: Systolic function was normal.  - Pulmonary arteries: Systolic pressure was moderately elevated PA    peak pressure: 56 mm Hg (S).  __________  Carotid artery ultrasound 11/2017: Right Carotid: Velocities in the right ICA are consistent with a 1-39%  stenosis.   Left Carotid: Velocities in the left ICA are  consistent with a 40-59%  stenosis. Calcified plaque may obscure  high velocities.   Vertebrals:  Bilateral vertebral arteries demonstrate antegrade flow.  Subclavians: Normal flow hemodynamics were seen in bilateral subclavian arteries.  __________  2D echo 07/2016: - Left ventricle: The cavity size was normal. There was mild    concentric hypertrophy. Systolic function was normal. The    estimated ejection fraction was in the range of 55% to 60%.    Regional wall motion abnormalities cannot be excluded. Left    ventricular diastolic function parameters were normal.  - Aortic valve: Transvalvular velocity was increased. There was    very mild stenosis. Peak velocity (S): 257 cm/s. Mean gradient    (S): 15 mm Hg. Peak gradient (S): 26 mm Hg.  - Mitral valve: There was mild regurgitation.  - Left atrium: The atrium was mildly dilated.  - Right ventricle: Systolic function was normal.  - Pulmonary arteries: Systolic pressure was within the normal    range. PA peak pressure: 35 mm Hg (S). __________  Lexiscan MPI 12/2014, ST segment depression was noted during stress in the II, III, V6, aVF and V5 leads. The study is normal. The left ventricular ejection fraction is normal (55-65%). The computer generated EF of 47% is clearly underestimating the LV function. The true EF is probably 65% . There is no evidence of ischemia or infarction. ___________  2D echo 10/2014: - Left ventricle: The cavity size was normal. Systolic function was    mildly to moderately reduced. The estimated ejection fraction was    in the range of 40% to 45%. Diffuse hypokinesis. There is severe    hypokinesis of the entireapical myocardium. Doppler parameters    are consistent with abnormal left ventricular relaxation (grade 1    diastolic dysfunction).  - Aortic valve: Trileaflet; moderately thickened, moderately    calcified leaflets. Valve mobility was restricted. Cannot exclude    vegetation. Transvalvular  velocity was increased, due to    stenosis. There was mild stenosis. Valve area (VTI): 1.65 cm^2.    Valve area (Vmax): 1.71 cm^2. Valve area (Vmean): 1.57 cm^2.  - Mitral valve: Calcified annulus. Cannot exclude vegetation. There    was mild regurgitation.  - Left atrium: The atrium was mildly dilated.  - Right ventricle: Systolic function was mildly reduced.  - Pulmonary arteries: Systolic pressure was mildly increased. PA    peak pressure: 43 mm Hg (S).  - Inferior vena cava: The vessel was dilated. The respirophasic    diameter changes were blunted (< 50%), consistent with elevated    central venous pressure.  - Pericardium, extracardiac: A trivial pericardial effusion was    identified posterior to the heart.   Impressions:   - No cardiac source of embolism was identified, but cannot be ruled    out on the basis of this examination.     EKG:  EKG is not ordered today.  Recent Labs: 09/08/2020: TSH 1.920 02/18/2021: ALT 11; Hemoglobin 13.7; Platelets 280 04/08/2021: BUN 27; Creatinine, Ser 1.10; Potassium 4.3; Sodium 141  Recent Lipid Panel    Component Value Date/Time   CHOL 154 09/08/2020 1617   CHOL 184 10/01/2013 1719   TRIG 85 09/08/2020 1617   TRIG 400 (H) 10/01/2013 1719   HDL 70 09/08/2020 1617   HDL 55 10/01/2013 1719   CHOLHDL 2.2 09/08/2020 1617   CHOLHDL 2.3 05/27/2017 1007   VLDL 28 06/02/2016 0714   VLDL 80 (H) 10/01/2013 1719   LDLCALC 68 09/08/2020 1617   LDLCALC 78 05/27/2017 1007   LDLCALC 49  10/01/2013 1719    PHYSICAL EXAM:    VS:  BP (!) 150/90   Pulse 66   Ht 5\' 11"  (1.803 m)   Wt 149 lb (67.6 kg)   SpO2 97%   BMI 20.78 kg/m   BMI: Body mass index is 20.78 kg/m.  Physical Exam Vitals reviewed.  Constitutional:      Appearance: He is well-developed.  HENT:     Head: Normocephalic and atraumatic.  Eyes:     General:        Right eye: No discharge.        Left eye: No discharge.  Neck:     Vascular: No JVD.  Cardiovascular:      Rate and Rhythm: Normal rate and regular rhythm.     Pulses:          Posterior tibial pulses are 2+ on the right side and 2+ on the left side.     Heart sounds: S1 normal and S2 normal. Heart sounds not distant. No midsystolic click and no opening snap. Murmur heard.  Harsh midsystolic murmur is present with a grade of 2/6 at the upper right sternal border radiating to the neck.  High-pitched blowing holosystolic murmur of grade 1/6 is also present at the apex.    No friction rub.  Pulmonary:     Effort: Pulmonary effort is normal. No respiratory distress.     Breath sounds: Normal breath sounds. No decreased breath sounds, wheezing or rales.  Chest:     Chest wall: No tenderness.  Abdominal:     General: There is no distension.     Palpations: Abdomen is soft.     Tenderness: There is no abdominal tenderness.  Musculoskeletal:     Cervical back: Normal range of motion.     Right lower leg: No edema.     Left lower leg: No edema.  Skin:    General: Skin is warm and dry.     Nails: There is no clubbing.  Neurological:     Mental Status: He is alert and oriented to person, place, and time.  Psychiatric:        Speech: Speech normal.        Behavior: Behavior normal.        Thought Content: Thought content normal.        Judgment: Judgment normal.    Wt Readings from Last 3 Encounters:  04/13/21 149 lb (67.6 kg)  03/24/21 151 lb 2 oz (68.5 kg)  03/11/21 149 lb 12.8 oz (67.9 kg)     ASSESSMENT & PLAN:   HFpEF/pulmonary hypertension: He appears euvolemic, well compensated, and at baseline.  His weight is down 2 pounds when compared to last visit, which was down 4 pounds when compared to visit prior.  No longer on standing diuretic.  Check BMP.  Continue optimal blood pressure control, daily weights, and sodium restriction.  Moderate to severe aortic stenosis with mild to moderate mitral regurgitation: Moderate to severe aortic stenosis on echo in 07/2020.  Mitral regurgitation  was noted to be mild to moderate at that time as well.  No symptoms of chest pain, worsening dyspnea, presyncope, or syncope.  He asks that his echo be postponed until after Christmas, which I feel is reasonable.  Echo has been rescheduled for 05/26/2021.  PAF: Maintaining sinus rhythm on exam.  He remains on carvedilol and apixaban without any symptoms concerning for bleeding and stable hemoglobin as outlined above.  Labile HTN with whitecoat syndrome:  Blood pressure is mildly elevated in the office today, though typically well controlled at home.  He remains on carvedilol.  HLD: LDL 68 in 08/2020.  He remains on lovastatin.  Left carotid artery stenosis: Historically, this has been followed by vascular surgery with most recent carotid artery ultrasound from 11/2017 demonstrating a 40 to 59% left internal carotid artery stenosis with a 1 to 39% right internal carotid artery stenosis.  Update carotid artery ultrasound.  He remains on apixaban in place of aspirin to minimize bleeding risk and statin therapy as outlined above.   Disposition: F/u with [name] or an APP in 4 months.   Medication Adjustments/Labs and Tests Ordered: Current medicines are reviewed at length with the patient today.  Concerns regarding medicines are outlined above. Medication changes, Labs and Tests ordered today are summarized above and listed in the Patient Instructions accessible in Encounters.   Signed, Christell Faith, PA-C 04/13/2021 1:24 PM     [address] [place], [place] [address] [phone]

## 2021-04-13 ENCOUNTER — Other Ambulatory Visit: Payer: Self-pay

## 2021-04-13 ENCOUNTER — Ambulatory Visit (INDEPENDENT_AMBULATORY_CARE_PROVIDER_SITE_OTHER): Payer: Medicare Other | Admitting: Physician Assistant

## 2021-04-13 ENCOUNTER — Encounter: Payer: Self-pay | Admitting: Physician Assistant

## 2021-04-13 VITALS — BP 150/90 | HR 66 | Ht 71.0 in | Wt 149.0 lb

## 2021-04-13 DIAGNOSIS — I779 Disorder of arteries and arterioles, unspecified: Secondary | ICD-10-CM | POA: Diagnosis not present

## 2021-04-13 DIAGNOSIS — I2721 Secondary pulmonary arterial hypertension: Secondary | ICD-10-CM | POA: Diagnosis not present

## 2021-04-13 DIAGNOSIS — I340 Nonrheumatic mitral (valve) insufficiency: Secondary | ICD-10-CM

## 2021-04-13 DIAGNOSIS — I5032 Chronic diastolic (congestive) heart failure: Secondary | ICD-10-CM | POA: Diagnosis not present

## 2021-04-13 DIAGNOSIS — I350 Nonrheumatic aortic (valve) stenosis: Secondary | ICD-10-CM

## 2021-04-13 DIAGNOSIS — R0989 Other specified symptoms and signs involving the circulatory and respiratory systems: Secondary | ICD-10-CM

## 2021-04-13 DIAGNOSIS — I480 Paroxysmal atrial fibrillation: Secondary | ICD-10-CM

## 2021-04-13 DIAGNOSIS — I6522 Occlusion and stenosis of left carotid artery: Secondary | ICD-10-CM | POA: Diagnosis not present

## 2021-04-13 NOTE — Patient Instructions (Signed)
Medication Instructions:  No changes at this time.  *If you need a refill on your cardiac medications before your next appointment, please call your pharmacy*   Lab Work: BMET today  If you have labs (blood work) drawn today and your tests are completely normal, you will receive your results only by: [place] (if you have MyChart) OR A paper copy in the mail If you have any lab test that is abnormal or we need to change your treatment, we will call you to review the results.   Testing/Procedures: Your physician has requested that you have an echocardiogram after Christmas.   Echocardiography is a painless test that uses sound waves to create images of your heart. It provides your doctor with information about the size and shape of your heart and how well your heart's chambers and valves are working. This procedure takes approximately one hour. There are no restrictions for this procedure.  Your physician has requested that you have a carotid duplex at same time as echocardiogram.   This test is an ultrasound of the carotid arteries in your neck. It looks at blood flow through these arteries that supply the brain with blood. Allow one hour for this exam. There are no restrictions or special instructions.   Follow-Up: At [facility], you and your health needs are our priority.  As part of our continuing mission to provide you with exceptional heart care, we have created designated Provider Care Teams.  These Care Teams include your primary Cardiologist (physician) and Advanced Practice Providers (APPs -  Physician Assistants and Nurse Practitioners) who all work together to provide you with the care you need, when you need it.   Your next appointment:   4 month(s)  The format for your next appointment:   In Person  Provider:   Kathlyn Sacramento, MD or Christell Faith, PA-C

## 2021-04-14 LAB — BASIC METABOLIC PANEL
BUN/Creatinine Ratio: 21 (ref 10–24)
BUN: 21 mg/dL (ref 8–27)
CO2: 24 mmol/L (ref 20–29)
Calcium: 9.1 mg/dL (ref 8.6–10.2)
Chloride: 102 mmol/L (ref 96–106)
Creatinine, Ser: 1.02 mg/dL (ref 0.76–1.27)
Glucose: 87 mg/dL (ref 70–99)
Potassium: 4.8 mmol/L (ref 3.5–5.2)
Sodium: 140 mmol/L (ref 134–144)
eGFR: 72 mL/min/{1.73_m2} (ref >59)

## 2021-04-15 ENCOUNTER — Other Ambulatory Visit: Payer: Self-pay

## 2021-04-15 ENCOUNTER — Ambulatory Visit (INDEPENDENT_AMBULATORY_CARE_PROVIDER_SITE_OTHER): Payer: Medicare Other | Admitting: Physician Assistant

## 2021-04-15 ENCOUNTER — Encounter: Payer: Self-pay | Admitting: Physician Assistant

## 2021-04-15 VITALS — BP 197/77 | HR 65 | Ht 71.0 in | Wt 146.0 lb

## 2021-04-15 DIAGNOSIS — R351 Nocturia: Secondary | ICD-10-CM

## 2021-04-15 DIAGNOSIS — R350 Frequency of micturition: Secondary | ICD-10-CM | POA: Diagnosis not present

## 2021-04-15 LAB — BLADDER SCAN AMB NON-IMAGING

## 2021-04-15 MED ORDER — MIRABEGRON ER 50 MG PO TB24: 50.0000 mg | ORAL_TABLET | Freq: Every day | ORAL | 2 refills | Status: DC

## 2021-04-16 NOTE — Progress Notes (Signed)
04/15/2021 11:33 AM   Jeffrey [name] Oct 12, 1932 [id number]  CC: Chief Complaint  Patient presents with   Urinary Frequency   HPI: Jeffrey [name] is a 87 y.o. male with PMH prostate cancer s/p IMRT and brachytherapy, urethral stricture s/p internal urethrotomy and circumcision in November 2018, OSA not on CPAP, and LUTS including daytime frequency, nocturia, and postvoid dribbling who presents today for symptom recheck on Myrbetriq 50 mg daily.   Today he reports there was a mixup with his prescriptions and he continued to receive Myrbetriq 25 mg daily.  He reports continued bothersome urinary symptoms, primarily nocturia x4-5.  He is occasionally double voiding as well.  He has been taking the Myrbetriq at bedtime and continues to not use his CPAP.  PVR 0 mL.  PMH: Past Medical History:  Diagnosis Date   Acute CVA (cerebrovascular accident) ([place])    Acute encephalopathy 11/27/2014   Aortic atherosclerosis ([place]) 11/25/2014   Atrial fibrillation ([place])    a. 12/2018 [facility]: Sinus rhythm, avg rate 71. 2 runs of VT (max 17 beats). 27 runs SVT (max 14 beats). Occas PACs/PVCs. No high grade AV block/pauses.   Chronic combined systolic (congestive) and diastolic (congestive) heart failure ([place])    a. 10/2014 Echo: EF 40-45%; b. 03/2018 Echo: EF 55-60%; c. 12/2018 Echo: EF 55-60%, mild conc LVH. Nl RV fxn. RVSP 51.55mmHg. Mildly dil LA. Mod AS w/ severe Ca2+; d. 12/2019 Echo: EF 50-55%, mild LVH, gr2 DD. Sev elev PASP. Mild MR, mild to mod TR. Triv AI, mod to sev AS (area 0.77cm^2, mean grad 12mmHg. Vmax 2.33m/s).   Diabetes mellitus    Hypercholesteremia    Hypertension    Impaired fasting glucose    Macular degeneration    Moderate to severe aortic stenosis    a. 12/2018 Echo: EF 55-60%, Mod AS w/ sev AoV Ca2+; b. 12/2019 Echo: EF 50-55%. Mod to sev AS (area 0.77cm^2, mean grad 57mmHg. Vmax 2.23m/s).   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 Hilarie Fredrickson)   RMSF [facility] spotted fever) 10/2014   (hosp with FUO, encephalopathy)   Rotator cuff tear, right 10/2010   supraspinatous and infraspinatous   Seizure disorder ([place]) 07/25/2019   TIA (transient ischemic attack) 10/02/13   hosp at [facility] x 1 night   Type 2 diabetes mellitus with microalbuminuria or microproteinuria 01/15/2014    Surgical History: Past Surgical History:  Procedure Laterality Date   CATARACT EXTRACTION Right 01/2017   CIRCUMCISION  01/2017   [name]   COLONOSCOPY N/A 11/28/2012   Procedure: COLONOSCOPY;  Surgeon: Jerene Bears, MD;  Location: [facility] ENDOSCOPY;  Service: Gastroenterology;  Laterality: N/A;   INTERNAL URETHROTOMY  01/27/2016   WF   prostate seed implant     ROTATOR CUFF REPAIR  06/16/2011   right (Dr. Berenice Primas)   TONSILLECTOMY AND ADENOIDECTOMY  age 66    Home Medications:  Allergies as of 04/15/2021   No Known Allergies      Medication List        Accurate as of April 15, 2021 11:59 PM. If you have any questions, ask your nurse or doctor.          acetaminophen 650 MG CR tablet Commonly known as: Tylenol Arthritis Pain Take 1 tablet (650 mg total) by mouth every 8 (eight)  hours as needed. What changed:  how much to take when to take this   carvedilol 6.25 MG tablet Commonly known as: COREG TAKE ONE TABLET TWICE DAILY WITH A MEAL   Eliquis 5 MG Tabs tablet Generic drug: apixaban TAKE ONE TABLET TWICE DAILY   furosemide 20 MG tablet Commonly known as: LASIX Take 1 tablet (20 mg total) by mouth daily. Take 1 tablet for 4 days   Levetiracetam 750 MG Tb24 Take 1 tablet (750 mg total) by mouth at bedtime.   loratadine 10 MG tablet Commonly known as: CLARITIN Take 10 mg by mouth daily as needed.   lovastatin 40 MG tablet Commonly known as: MEVACOR TAKE TWO TABLETS EACH NIGHT AT BEDTIME    mirabegron ER 50 MG Tb24 tablet Commonly known as: MYRBETRIQ Take 1 tablet (50 mg total) by mouth daily. Started by: Debroah Loop, PA-C   PreserVision AREDS Caps Take 1 capsule by mouth 2 (two) times daily.   UNABLE TO FIND Bevacizumab(AVASTIN) chemo injection for the eye every 10 weeks   VITAMIN B 12 PO Take 1,000 mcg by mouth.        Allergies:  No Known Allergies  Family History: Family History  Problem Relation Age of Onset   Heart disease Father 41       Died suddenly.  No diagnosis   Stroke Sister    Dementia Mother    Diabetes Mother    Heart disease Mother    Hypertension Mother    Cancer Daughter        ovarian (?) vs other male cancer; s/p hyst doing well   Diabetes Daughter        GDM, and now AODM   Heart disease Son        congestive heart failure--improved   Congestive Heart Failure Grandchild     Social History:   reports that he quit smoking about 40 years ago. His smoking use included cigarettes. He has a 20.00 pack-year smoking history. He has never used smokeless tobacco. He reports current alcohol use of about 14.0 standard drinks per week. He reports that he does not use drugs.  Physical Exam: BP (!) 197/77   Pulse 65   Ht 5\' 11"  (1.803 m)   Wt 146 lb (66.2 kg)   BMI 20.36 kg/m   Constitutional:  Alert and oriented, no acute distress, nontoxic appearing HEENT: [place], AT Cardiovascular: No clubbing, cyanosis, or edema Respiratory: Normal respiratory effort, no increased work of breathing Skin: No rashes, bruises or suspicious lesions Neurologic: Grossly intact, no focal deficits, moving all 4 extremities Psychiatric: Normal mood and affect  Laboratory Data: Results for orders placed or performed in visit on 04/15/21  Bladder Scan (Post Void Residual) in office  Result Value Ref Range   Scan Result 32mL    Assessment & Plan:   1. Urinary frequency Not yet at treatment goal, but patient did not receive his intended Myrbetriq  50 mg daily.  We will represcribed this today and plan for symptom recheck and PVR in 3 months. - Bladder Scan (Post Void Residual) in office - mirabegron ER (MYRBETRIQ) 50 MG TB24 tablet; Take 1 tablet (50 mg total) by mouth daily.  Dispense: 30 tablet; Refill: 2  2. Nocturia We discussed the role of untreated sleep apnea and nocturia and I encouraged him to start using his CPAP.  Return in about 3 months (around 07/16/2021) for 34mo w/PVR.  Debroah Loop, PA-C  [address], [address]  [place], [place] [address] [phone]

## 2021-04-30 ENCOUNTER — Telehealth: Payer: Self-pay | Admitting: Pharmacist

## 2021-04-30 ENCOUNTER — Other Ambulatory Visit: Payer: Self-pay

## 2021-04-30 ENCOUNTER — Ambulatory Visit: Payer: Medicare Other | Admitting: Pharmacist

## 2021-04-30 ENCOUNTER — Encounter: Payer: Self-pay | Admitting: Podiatry

## 2021-04-30 ENCOUNTER — Ambulatory Visit (INDEPENDENT_AMBULATORY_CARE_PROVIDER_SITE_OTHER): Payer: Medicare Other | Admitting: Podiatry

## 2021-04-30 VITALS — BP 160/85

## 2021-04-30 DIAGNOSIS — M79675 Pain in left toe(s): Secondary | ICD-10-CM | POA: Diagnosis not present

## 2021-04-30 DIAGNOSIS — I480 Paroxysmal atrial fibrillation: Secondary | ICD-10-CM

## 2021-04-30 DIAGNOSIS — B351 Tinea unguium: Secondary | ICD-10-CM

## 2021-04-30 DIAGNOSIS — I10 Essential (primary) hypertension: Secondary | ICD-10-CM

## 2021-04-30 DIAGNOSIS — M79674 Pain in right toe(s): Secondary | ICD-10-CM

## 2021-04-30 DIAGNOSIS — E1151 Type 2 diabetes mellitus with diabetic peripheral angiopathy without gangrene: Secondary | ICD-10-CM

## 2021-04-30 NOTE — Progress Notes (Signed)
A user error has taken place: encounter opened in error, closed for administrative reasons.

## 2021-04-30 NOTE — Progress Notes (Signed)
Chronic Care Management Pharmacy Note  05/07/2021 Name:  Jeffrey [name] MRN:  [id number] DOB:  1932/08/13  Summary: BP is not at goal < 140/90  Recommendations/Changes made from today's visit: -Recommended routine BP monitoring at home and bringing cuff to next office visit to ensure accuracy -Recommended updating medication list as part of system for use to ensure he is taking everything daily  Plan: BP assessment in 1 month      Subjective: Jeffrey [name] is an 87 y.o. year old male who is a primary patient of Rita Ohara, MD.  The CCM team was consulted for assistance with disease management and care coordination needs.    Engaged with patient face to face for initial visit in response to provider referral for pharmacy case management and/or care coordination services.   Consent to Services:  The patient was given the following information about Chronic Care Management services today, agreed to services, and gave verbal consent: 1. CCM service includes personalized support from designated clinical staff supervised by the primary care provider, including individualized plan of care and coordination with other care providers 2. 24/7 contact phone numbers for assistance for urgent and routine care needs. 3. Service will only be billed when office clinical staff spend 20 minutes or more in a month to coordinate care. 4. Only one practitioner may furnish and bill the service in a calendar month. 5.The patient may stop CCM services at any time (effective at the end of the month) by phone call to the office staff. 6. The patient will be responsible for cost sharing (co-pay) of up to 20% of the service fee (after annual deductible is met). Patient agreed to services and consent obtained.  Patient Care Team: Rita Ohara, MD as PCP - General (Family Medicine) Wellington Hampshire, MD as PCP - Cardiology (Cardiology) Wellington Hampshire, MD as Consulting Physician (Cardiology) Garvin Fila, MD as  Consulting Physician (Neurology) Viona Gilmore, [facility] as Pharmacist (Pharmacist)  Recent office visits: 03/11/21 Rita Ohara, MD - Patient presented for Chronic left shoulder pain and other concerns.    02/18/21 Rita Ohara, MD - Patient presented for Orthostasis and other concerns. No medication changes.   Recent consult visits: 04/30/21 Gardiner Barefoot DPM (Podiatry) - Patient presented for Routine nail/foot care, To return in 4 months no medication changes.    04/15/21 Debroah Loop, PA-C (Urology) - Patient presented for Urinary frequency ad other concerns. Prescribed Mirabegron 50 mg.   04/13/21 Rise Mu, PA-C (Cardiology) - Patient presented for Chronic diastolic CHF and other concerns. No medication changes.   03/25/21 Poggi, Smith Mince, MD (Radiology) - Patient presented for Bilateral shoulder pain Cervical X ray done. No medication changes noted.   03/24/21  Arvil Chaco, PA-C (Cardiology) - Patient presented for Pulmonary Artery Hypertension and other concerns. Prescribed Furosemide 20 mg    03/09/21 Frann Rider, NP (Neurology) - Patient presented for Focal and partial seizures and other concerns. Increased Levetiracetam to 750 mg oral at bedtime.   01/29/21 Rella Larve - Patient presented for Macula OCT of both eyes and Optical Coherence Tonography.Intravitreal Injection administered in right eye.  No other visit details available.   01/13/21 Debroah Loop, PA-C (Urology) - Patient presented for Urinary frequency. Increased Mirabegron to 50 mg daily.   12/15/20 Debroah Loop, PA-C (Urology) - Patient presented for Urinary frequency. Prescribed Mirabegron 25 mg daily and Stopped Silodosin 8 mg.   11/27/20 Rella Larve - Patient presented for Posterior vitreous  detachment and other concerns. .  Intravitreal Injection administered in right eye. No other visit details available.   11/06/20 Stoioff, Ronda Fairly, MD - Patient presented  for Urinary frequency and other concerns. Prescribed Silodosin 8 mg daily.   Hospital visits: Medication Reconciliation was completed by comparing discharge summary, patient's EMR and Pharmacy list, and upon discussion with patient.   Patient presented to [facility] ED on 11/12/20 due to Syncope. Patient was present for 1 hour.    New?Medications Started at [facility] Discharge:?? -started  none   Medication Changes at Hospital Discharge: -Changed  none   Medications Discontinued at Hospital Discharge: -Stopped  none   Medications that remain the same after Hospital Discharge:??  -All other medications will remain the same   Objective:  Lab Results  Component Value Date   CREATININE 1.02 04/13/2021   BUN 21 04/13/2021   GFRNONAA >60 11/12/2020   GFRAA 92 01/23/2020   NA 140 04/13/2021   K 4.8 04/13/2021   CALCIUM 9.1 04/13/2021   CO2 24 04/13/2021   GLUCOSE 87 04/13/2021    Lab Results  Component Value Date/Time   HGBA1C 5.6 09/08/2020 03:09 PM   HGBA1C 6.0 (A) 02/12/2020 09:55 AM   HGBA1C 6.0 (H) 12/22/2018 08:30 AM   HGBA1C 6.2 (H) 11/30/2014 03:45 AM   MICROALBUR 4.1 05/27/2017 10:07 AM   MICROALBUR 10.5 05/19/2016 09:52 AM    Last diabetic Eye exam:  Lab Results  Component Value Date/Time   HMDIABEYEEXA No Retinopathy 08/29/2016 12:00 AM    Last diabetic Foot exam: No results found for: HMDIABFOOTEX   Lab Results  Component Value Date   CHOL 154 09/08/2020   HDL 70 09/08/2020   LDLCALC 68 09/08/2020   TRIG 85 09/08/2020   CHOLHDL 2.2 09/08/2020    Hepatic Function Latest Ref Rng & Units 02/18/2021 09/08/2020 07/26/2019  Total Protein 6.0 - 8.5 g/dL 7.3 7.5 7.2  Albumin 3.6 - 4.6 g/dL 4.7(H) 4.5 4.6  AST 0 - 40 IU/L _0 ALT 0 - 44 IU/L _1 Alk Phosphatase 44 - 121 IU/L 39(L) 41(L) 47  Total Bilirubin 0.0 - 1.2 mg/dL 0.5 0.5 0.6  Bilirubin, Direct <=0.2 mg/dL - - -    Lab Results  Component Value Date/Time   TSH  1.920 09/08/2020 04:17 PM   TSH 1.490 07/05/2018 09:10 AM    CBC Latest Ref Rng & Units 02/18/2021 11/12/2020 09/08/2020  WBC 3.4 - 10.8 x10E3/uL 7.9 6.4 7.6  Hemoglobin 13.0 - 17.7 g/dL 13.7 11.6(L) 13.2  Hematocrit 37.5 - 51.0 % 41.2 36.0(L) 39.4  Platelets 150 - 450 x10E3/uL 280 231 284    No results found for: VD25OH  Clinical ASCVD: Yes  The ASCVD Risk score (Arnett DK, et al., 2019) failed to calculate for the following reasons:   The 2019 ASCVD risk score is only valid for ages 33 to 1   The patient has a prior MI or stroke diagnosis    Depression screen [facility] 2/9 03/11/2021 02/18/2021 09/08/2020  Decreased Interest 0 0 0  Down, Depressed, Hopeless 0 0 0  PHQ - 2 Score 0 0 0  Altered sleeping - - -  Tired, decreased energy - - -  Change in appetite - - -  Feeling bad or failure about yourself  - - -  Trouble concentrating - - -  Moving slowly or fidgety/restless - - -  Suicidal thoughts - - -  PHQ-9 Score - - -  Some recent data might be hidden    CHA2DS2/VAS Stroke Risk Points  Current as of just now     7 >= 2 Points: High Risk  1 - 1.99 Points: Medium Risk  0 Points: Low Risk    Last Change: N/A      Details    This score determines the patient's risk of having a stroke if the  patient has atrial fibrillation.       Points Metrics  1 Has Congestive Heart Failure:  Yes    Current as of just now  0 Has Vascular Disease:  No    Current as of just now  1 Has Hypertension:  Yes    Current as of just now  2 Age:  73    Current as of just now  1 Has Diabetes:  Yes    Current as of just now  2 Had Stroke:  Yes  Had TIA:  Yes  Had Thromboembolism:  No    Current as of just now  0 Male:  No    Current as of just now        Social History   Tobacco Use  Smoking Status Former   Packs/day: 1.00   Years: 20.00   Pack years: 20.00   Types: Cigarettes   Quit date: 05/31/1980   Years since quitting: 40.9  Smokeless Tobacco Never   BP Readings from Last 3  Encounters:  04/30/21 (!) 160/85  04/15/21 (!) 197/77  04/13/21 (!) 150/90   Pulse Readings from Last 3 Encounters:  04/15/21 65  04/13/21 66  03/24/21 74   Wt Readings from Last 3 Encounters:  04/15/21 146 lb (66.2 kg)  04/13/21 149 lb (67.6 kg)  03/24/21 151 lb 2 oz (68.5 kg)   BMI Readings from Last 3 Encounters:  04/15/21 20.36 kg/m  04/13/21 20.78 kg/m  03/24/21 21.08 kg/m    Assessment/Interventions: Review of patient past medical history, allergies, medications, health status, including review of consultants reports, laboratory and other test data, was performed as part of comprehensive evaluation and provision of chronic care management services.   SDOH:  (Social Determinants of Health) assessments and interventions performed: Yes SDOH Interventions    Flowsheet Row Most Recent Value  SDOH Interventions   Financial Strain Interventions Intervention Not Indicated  Transportation Interventions Intervention Not Indicated      SDOH Screenings   Alcohol Screen: Not on file  Depression (PHQ2-9): Low Risk    PHQ-2 Score: 0  Financial Resource Strain: Low Risk    Difficulty of Paying Living Expenses: Not very hard  Food Insecurity: Not on file  Housing: Not on file  Physical Activity: Not on file  Social Connections: Not on file  Stress: Not on file  Tobacco Use: Medium Risk   Smoking Tobacco Use: Former   Smokeless Tobacco Use: Never   Passive Exposure: Not on file  Transportation Needs: No Transportation Needs   Lack of Transportation (Medical): No   Lack of Transportation (Non-Medical): No   Patient brought his wife and daughter to the appointment.   He does very little other than staying at home. He lives with his wife and she does a lot of the housework and he does the yard work sometimes.  His wife also does the cooking and they also eat out about once a week with family members. He eats lots of chicken and seafood, some fruits and vegetables and  probably too many sweets. He does eat 3 meals  a day.  For exercise, he goes out walking his dog a couple times a day who is a male middle aged Shelty and can only walk for a few minutes.  Patient sleeps pretty well but reports his wife has a problem staying asleep. He reports he "can sleep standing up". He doesn't have a lot of energy right now, which has been ongoing since the start of the pandemic. He does dose off pretty often and can fall asleep with jeopardy on.  He reports some of his medications are more expensive but he is not sure which ones are except for the Eliquis. Recommended he take note to inquire about at future visits.    CCM Care Plan  No Known Allergies  Medications Reviewed Today     Reviewed by Viona Gilmore, [facility] (Pharmacist) on 05/07/21 at 1405  Med List Status: <None>   Medication Order Taking? Sig Documenting Provider Last Dose Status Informant  acetaminophen (TYLENOL ARTHRITIS PAIN) 650 MG CR tablet [id number] Yes Take 1 tablet (650 mg total) by mouth every 8 (eight) hours as needed.  Patient taking differently: Take 500 mg by mouth in the morning and at bedtime.   Bary Leriche, PA-C Taking Active            Med Note Pincus Large Jul 26, 2019 10:28 AM)    carvedilol (COREG) 6.25 MG tablet [id number] Yes TAKE ONE TABLET TWICE DAILY WITH A MEAL Rise Mu, PA-C Taking Active            Med Note Orest Dikes Mar 11, 2021 11:17 AM)    Cyanocobalamin (VITAMIN B 12 PO) [id number] Yes Take 1,000 mcg by mouth. [provider] Taking Active   ELIQUIS 5 MG TABS tablet [id number] Yes TAKE ONE TABLET TWICE DAILY Wellington Hampshire, MD Taking Active   furosemide (LASIX) 20 MG tablet [id number]  Take 1 tablet (20 mg total) by mouth daily. Take 1 tablet for 4 days Magda Bernheim  Active   levETIRAcetam 750 MG TB24 [id number] Yes Take 1 tablet (750 mg total) by mouth at bedtime. Frann Rider, NP Taking Active            Med Note  Britt Bottom   Tue Mar 24, 2021  8:39 AM)    loratadine (CLARITIN) 10 MG tablet [id number] Yes Take 10 mg by mouth daily as needed. [provider] Taking Active   lovastatin (MEVACOR) 40 MG tablet [id number] Yes TAKE TWO TABLETS EACH NIGHT AT BEDTIME Rita Ohara, MD Taking Active   mirabegron ER (MYRBETRIQ) 50 MG TB24 tablet [id number] Yes Take 1 tablet (50 mg total) by mouth daily. Debroah Loop, PA-C Taking Active   Multiple Vitamins-Minerals (PRESERVISION AREDS) CAPS [id number] Yes Take 1 capsule by mouth 2 (two) times daily.  [provider] Taking Active Self  UNABLE TO FIND [id number] Yes Bevacizumab(AVASTIN) chemo injection for the eye every 10 weeks [provider] Taking Active Self            Patient Active Problem List   Diagnosis Date Noted   Mild protein-calorie malnutrition ([place]) 02/20/2021   Nocturia 10/01/2020   Urinary frequency 10/01/2020   History of prostate cancer 10/01/2020   History of urethral stricture 10/01/2020   Pain due to onychomycosis of toenails of both feet 07/07/2020   Hematoma 07/07/2020   Seizure disorder ([place]) 07/25/2019   Obstructive sleep apnea 06/27/2018   Pulmonary hypertension ([place]) 06/27/2018  Long term current use of anticoagulant 05/28/2017   Type 2 diabetes mellitus with microalbuminuria, without long-term current use of insulin ([place]) 05/28/2017   H/O [facility] spotted fever 07/30/2015   Aortic valve stenosis 04/11/2015   Debility 02/04/2015   Cardiomyopathy ([place]) 01/09/2015   Paroxysmal atrial fibrillation ([place]) 12/24/2014   Stroke with cerebral ischemia ([place]) 12/03/2014   Pre-diabetes    Acute on chronic combined systolic and diastolic CHF (congestive heart failure) ([place])    Acute CVA (cerebrovascular accident) ([place])    Left carotid artery stenosis 11/30/2014   Lacunar infarct, acute ([place])    Aortic atherosclerosis ([place]) 11/25/2014   DM (diabetes mellitus), type 2 with peripheral vascular  complications ([place]) 74/04/8785   Awareness alteration, transient 12/26/2013   Low serum testosterone level 12/25/2013   TIA (transient ischemic attack) 10/10/2013   Radiation proctitis 11/28/2012   Colon cancer screening 11/28/2012   Murmur 02/23/2011   Type 2 diabetes mellitus with hypercholesterolemia ([place]) 02/05/2011   Essential hypertension, benign 11/26/2010   Impaired fasting glucose 11/26/2010   Pure hypercholesterolemia 11/26/2010   Prostate cancer ([place]) 11/26/2010    Immunization History  Administered Date(s) Administered   Influenza Split 01/30/2012, 02/03/2014, 03/25/2015   Influenza, High Dose Seasonal PF 03/17/2016, 03/04/2017, 02/22/2018, 02/29/2020, 02/19/2021   Influenza-Unspecified 01/29/2013, 03/25/2015, 03/14/2019   PFIZER Comirnaty(Gray Top)Covid-19 Tri-Sucrose Vaccine 09/08/2020   PFIZER(Purple Top)SARS-COV-2 Vaccination 06/06/2019, 06/27/2019, 03/07/2020   Pfizer Covid-19 Vaccine Bivalent Booster 70yr & up 02/19/2021   Pneumococcal Conjugate-13 10/17/2014   Pneumococcal Polysaccharide-23 12/17/2004, 10/02/2012   Td 10/29/2004   Conditions to be addressed/monitored:  Hypertension, Hyperlipidemia, Diabetes, Atrial Fibrillation, Heart Failure, Overactive Bladder, and Seizures  Care Plan : [place] Updates made by PViona Gilmore RYuccasince 05/07/2021 12:00 AM     Problem: Problem: Hypertension, Hyperlipidemia, Diabetes, Atrial Fibrillation, Heart Failure, Overactive Bladder, and Seizures      Long-Range Goal: Patient-Specific Goal   Start Date: 04/30/2021  Expected End Date: 04/30/2022  This Visit's Progress: On track  Priority: High  Note:   Current Barriers:  Unable to independently monitor therapeutic efficacy Unable to achieve control of blood pressure   Pharmacist Clinical Goal(s):  Patient will achieve adherence to monitoring guidelines and medication adherence to achieve therapeutic efficacy achieve control of blood pressure as  evidenced by home and office readings  through collaboration with PharmD and provider.   Interventions: 1:1 collaboration with KRita Ohara MD regarding development and update of comprehensive plan of care as evidenced by provider attestation and co-signature Inter-disciplinary care team collaboration (see longitudinal plan of care) Comprehensive medication review performed; medication list updated in electronic medical record  Hypertension (BP goal <140/90) -Uncontrolled -Current treatment: Furosemide 20 mg 1 tablet daily Carvedilol 6.25 mg 1 tablet twice daily -Medications previously tried: unknown  -Current home readings: could not provide -Current dietary habits: lots of frozen meals; canned soups sometimes  -Current exercise habits: walking a few minutes a day -Denies hypotensive/hypertensive symptoms -Educated on BP goals and benefits of medications for prevention of heart attack, stroke and kidney damage; Importance of home blood pressure monitoring; Proper BP monitoring technique; -Counseled to monitor BP at home twice weekly, document, and provide log at future appointments -Counseled on diet and exercise extensively Recommended to continue current medication  Hyperlipidemia: (LDL goal < 70) -Controlled -Current treatment: Lovastatin 40 mg 2 tablets at bedtime -Medications previously tried: n/a  -Current dietary patterns: does eat fried foods and uses the cheapest oil (sometimes vegetable oil) -Current exercise  habits: walking a few minutes a day -Educated on Cholesterol goals;  Benefits of statin for ASCVD risk reduction; Importance of limiting foods high in cholesterol; Exercise goal of 150 minutes per week; -Counseled on diet and exercise extensively Recommended to continue current medication  Diabetes (A1c goal <7%) -Controlled -Current medications: No medications -Medications previously tried: unknown  -Current home glucose readings fasting glucose: does not  check often post prandial glucose: does not check often -Denies hypoglycemic/hyperglycemic symptoms -Current meal patterns:  breakfast: did not discuss  lunch: did not discuss  dinner: did not discuss snacks: did not discuss drinks: did not discuss -Current exercise: walking a few minutes per day -Educated on A1c and blood sugar goals; Carbohydrate counting and/or plate method -Counseled to check feet daily and get yearly eye exams -Counseled on diet and exercise extensively  Atrial Fibrillation (Goal: prevent stroke and major bleeding) -Controlled -CHADSVASC: 7 -Current treatment: Rate control: Carvedilol 6.25 mg 1 tablet twice daily Anticoagulation: Eliquis 5 mg 1 tablet twice daily -Medications previously tried: none -Home BP and HR readings: refer to above  -Counseled on increased risk of stroke due to Afib and benefits of anticoagulation for stroke prevention; importance of adherence to anticoagulant exactly as prescribed; bleeding risk associated with Eliquis and importance of self-monitoring for signs/symptoms of bleeding; -Counseled on diet and exercise extensively Recommended to continue current medication Assessed patient finances. Patient reports Eliquis can be expensive at times.  Heart Failure (Goal: manage symptoms and prevent exacerbations) -Controlled -Last ejection fraction: 55-60% (Date: 08/08/20) -HF type: Diastolic -NYHA Class: II (slight limitation of activity) -AHA HF Stage: C (Heart disease and symptoms present) -Current treatment: Furosemide 20 mg 1 tablet daily Carvedilol 6.25 mg 1 tablet twice daily -Medications previously tried: n/a  -Current home BP/HR readings: refer to above -Current dietary habits: eats out often and frozen meals -Current exercise habits: a little bit of walking daily -Educated on Benefits of medications for managing symptoms and prolonging life Importance of weighing daily; if you gain more than 3 pounds in one day or 5  pounds in one week, call cardiologist. -Counseled on diet and exercise extensively Recommended to continue current medication  Overactive bladder (Goal: minimize symptoms) -Controlled -Current treatment  Myrbetriq 50 mg 1 tablet daily -Medications previously tried: none  -Recommended to continue current medication  Seizures (Goal: prevent seizures) -Controlled -Current treatment  Levetiracetam 750 mg 1 tablet at bedtime -Medications previously tried: none  -Assessed formulary for alternatives to see if anything is less expensive.  Health Maintenance -Vaccine gaps: shingrix, tetanus -Current therapy:  Preservision 1 capsule twice daily Vitamin B12 1000 mcg daily Loratadine 10 mg 1 tablet daily as needed Acetaminophen 650 mg as needed Betamethasone ointment 0.05%  -Educated on Cost vs benefit of each product must be carefully weighed by individual consumer -Patient is satisfied with current therapy and denies issues -Recommended to continue current medication  Patient Goals/Self-Care Activities Patient will:  - take medications as prescribed as evidenced by patient report and record review check blood pressure twice weekly, document, and provide at future appointments target a minimum of 150 minutes of moderate intensity exercise weekly  Follow Up Plan: The care management team will reach out to the patient again over the next 30 days.       Medication Assistance: None required.  Patient affirms current coverage meets needs.  Compliance/Adherence/Medication fill history: Care Gaps: shingrix, tetanus, eye exam, A1c Last BP: 197/77 (04/15/21) A1c: 5.6%  Star-Rating Drugs: Lovastatin 40 mg - last filled 04/03/21  for 90 ds at [facility]  Patient's preferred pharmacy is:  [place], [place] - [address] [place] [address] Phone: [phone] Fax: [phone]  Uses pill box? No - using a checklist (labels with  number)   Pt endorses 90% compliance - ocassionally misses - after breakfast and after dinner  We discussed: Current pharmacy is preferred with insurance plan and patient is satisfied with pharmacy services Patient decided to: Continue current medication management strategy  Care Plan and Follow Up Patient Decision:  Patient agrees to Care Plan and Follow-up.  Plan: The care management team will reach out to the patient again over the next 30 days.  Jeni Salles, PharmD, [facility] [phone]

## 2021-04-30 NOTE — Chronic Care Management (AMB) (Deleted)
Chronic Care Management Pharmacy Assistant   Name: Jeffrey [name]  MRN: [id number] DOB: 03-May-1933  Reason for Encounter: Chart Review for Initial Encounter on 04/30/21 at 3 pm in office   Conditions to be addressed/monitored: {CCM ASSESSMENT DISEASE OPTIONS:25047}  Recent office visits:  03/11/21 Rita Ohara, MD - Patient presented for Chronic left shoulder pain and other concerns.   02/18/21 Rita Ohara, MD - Patient presented for Orthostasis and other concerns. No medication changes.  Recent consult visits:  04/30/21 Gardiner Barefoot DPM (Podiatry) - Patient presented for Routine nail/foot care, To return in 4 months no medication changes.   04/15/21 Debroah Loop, PA-C (Urology) - Patient presented for Urinary frequency ad other concerns. Prescribed Mirabegron 50 mg.  04/13/21 Rise Mu, PA-C (Cardiology) - Patient presented for Chronic diastolic CHF and other concerns. No medication changes.  03/25/21 Poggi, Smith Mince, MD (Radiology) - Patient presented for Bilateral shoulder pain Cervical X ray done. No medication changes noted.  03/24/21  Arvil Chaco, PA-C (Cardiology) - Patient presented for Pulmonary Artery Hypertension and other concerns. Prescribed Furosemide 20 mg   03/09/21 Frann Rider, NP (Neurology) - Patient presented for Focal and partial seizures and other concerns. Increased Levetiracetam to 750 mg oral at bedtime.  01/29/21 Rella Larve - Patient presented for Macula OCT of both eyes and Optical Coherence Tonography.Intravitreal Injection administered in right eye.  No other visit details available.  01/13/21 Debroah Loop, PA-C (Urology) - Patient presented for Urinary frequency. Increased Mirabegron to 50 mg daily.  12/15/20 Debroah Loop, PA-C (Urology) - Patient presented for Urinary frequency. Prescribed Mirabegron 25 mg daily and Stopped Silodosin 8 mg.  11/27/20 Rella Larve - Patient presented for  Posterior vitreous detachment and other concerns. .  Intravitreal Injection administered in right eye. No other visit details available.  11/06/20 Stoioff, Ronda Fairly, MD - Patient presented for Urinary frequency and other concerns. Prescribed Silodosin 8 mg daily.   Hospital visits:  Medication Reconciliation was completed by comparing discharge summary, patient's EMR and Pharmacy list, and upon discussion with patient.  Patient presented to [facility] ED on 11/12/20 due to Syncope. Patient was present for 1 hour.   New?Medications Started at [facility] Discharge:?? -started  none  Medication Changes at Hospital Discharge: -Changed  none  Medications Discontinued at Hospital Discharge: -Stopped  none  Medications that remain the same after Hospital Discharge:??  -All other medications will remain the same.    Medications: Outpatient Encounter Medications as of 04/30/2021  Medication Sig   acetaminophen (TYLENOL ARTHRITIS PAIN) 650 MG CR tablet Take 1 tablet (650 mg total) by mouth every 8 (eight) hours as needed. (Patient taking differently: Take 1,300 mg by mouth in the morning and at bedtime.)   carvedilol (COREG) 6.25 MG tablet TAKE ONE TABLET TWICE DAILY WITH A MEAL   Cyanocobalamin (VITAMIN B 12 PO) Take 1,000 mcg by mouth.   ELIQUIS 5 MG TABS tablet TAKE ONE TABLET TWICE DAILY   furosemide (LASIX) 20 MG tablet Take 1 tablet (20 mg total) by mouth daily. Take 1 tablet for 4 days   levETIRAcetam 750 MG TB24 Take 1 tablet (750 mg total) by mouth at bedtime.   loratadine (CLARITIN) 10 MG tablet Take 10 mg by mouth daily as needed.   lovastatin (MEVACOR) 40 MG tablet TAKE TWO TABLETS EACH NIGHT AT BEDTIME   mirabegron ER (MYRBETRIQ) 50 MG TB24 tablet Take 1 tablet (50 mg total) by mouth daily.   Multiple  Vitamins-Minerals (PRESERVISION AREDS) CAPS Take 1 capsule by mouth 2 (two) times daily.    UNABLE TO FIND Bevacizumab(AVASTIN) chemo injection for the eye every  10 weeks   No facility-administered encounter medications on file as of 04/30/2021.  Fill History : CARVEDILOL 6.25 MG TAB 02/18/2021 90   ELIQUIS 5 MG TAB 04/24/2021 30   FUROSEMIDE 20 MG TAB 03/24/2021 30   LEVETIRACETAM ER 750 MG TAB 04/03/2021 30   LOVASTATIN 40 MG TAB 04/03/2021 90   MYRBETRIQ 50 MG TAB 04/15/2021 30        Care Gaps: Zoster Vaccines - Overdue TDAP - Overdue Eye Exam - DONE HGB A1C - Overdue  BP - 197/77 ( 04/15/21) Lab Results  Component Value Date   HGBA1C 5.6 09/08/2020    Star Rating Drugs: Lovastatin (Mevacor) 40 mg - Last filled 04/03/21 90 DS at [place]***

## 2021-04-30 NOTE — Progress Notes (Signed)
This patient returns to my office for at risk foot care.  This patient requires this care by a professional since this patient will be at risk due to having type 2 diabetes     This patient is unable to cut nails himself since the patient cannot reach his nails.These nails are painful walking and wearing shoes.  This patient presents for at risk foot care today.  General Appearance  Alert, conversant and in no acute stress.  Vascular  Dorsalis pedis and posterior tibial  pulses are  weakly palpable  bilaterally.  Capillary return is within normal limits  bilaterally. Temperature is within normal limits  bilaterally.  Neurologic  Senn-Weinstein monofilament wire test within normal limits  bilaterally. Muscle power within normal limits bilaterally.  Nails Thick disfigured discolored nails with subungual debris  from hallux to fifth toes bilaterally. No evidence of bacterial infection or drainage bilaterally.  Subungual hematoma which is dried with no fluctuation.  Orthopedic  No limitations of motion  feet .  No crepitus or effusions noted.  No bony pathology or digital deformities noted.  Skin  normotropic skin with no porokeratosis noted bilaterally.  No signs of infections or ulcers noted.     Onychomycosis  Pain in right toes  Pain in left toes   Consent was obtained for treatment procedures.   Mechanical debridement of nails 1-5  bilaterally performed with a nail nipper.  Filed with dremel without incident.     Return office visit   4  months.                  Told patient to return for periodic foot care and evaluation due to potential at risk complications.   Gardiner Barefoot DPM

## 2021-04-30 NOTE — Progress Notes (Signed)
Chronic Care Management Pharmacy Assistant     Name: Jeffrey [name]  MRN: [id number] DOB: 1933-01-13  Reason for Encounter: Chart Review for Initial Encounter on 04/30/21 at 3 pm in office   Conditions to be addressed/monitored: HTN, DMII, and Urinary frequency, Seizure disorder  Recent office visits:  03/11/21 Rita Ohara, MD - Patient presented for Chronic left shoulder pain and other concerns.   02/18/21 Rita Ohara, MD - Patient presented for Orthostasis and other concerns. No medication changes.  Recent consult visits:  04/30/21 Gardiner Barefoot DPM (Podiatry) - Patient presented for Routine nail/foot care, To return in 4 months no medication changes.   04/15/21 Debroah Loop, PA-C (Urology) - Patient presented for Urinary frequency ad other concerns. Prescribed Mirabegron 50 mg.  04/13/21 Rise Mu, PA-C (Cardiology) - Patient presented for Chronic diastolic CHF and other concerns. No medication changes.  03/25/21 Poggi, Smith Mince, MD (Radiology) - Patient presented for Bilateral shoulder pain Cervical X ray done. No medication changes noted.  03/24/21  Arvil Chaco, PA-C (Cardiology) - Patient presented for Pulmonary Artery Hypertension and other concerns. Prescribed Furosemide 20 mg   03/09/21 Frann Rider, NP (Neurology) - Patient presented for Focal and partial seizures and other concerns. Increased Levetiracetam to 750 mg oral at bedtime.  01/29/21 Rella Larve - Patient presented for Macula OCT of both eyes and Optical Coherence Tonography.Intravitreal Injection administered in right eye.  No other visit details available.  01/13/21 Debroah Loop, PA-C (Urology) - Patient presented for Urinary frequency. Increased Mirabegron to 50 mg daily.  12/15/20 Debroah Loop, PA-C (Urology) - Patient presented for Urinary frequency. Prescribed Mirabegron 25 mg daily and Stopped Silodosin 8 mg.  11/27/20 Rella Larve - Patient  presented for Posterior vitreous detachment and other concerns. .  Intravitreal Injection administered in right eye. No other visit details available.  11/06/20 Stoioff, Ronda Fairly, MD - Patient presented for Urinary frequency and other concerns. Prescribed Silodosin 8 mg daily.   Hospital visits:  Medication Reconciliation was completed by comparing discharge summary, patient's EMR and Pharmacy list, and upon discussion with patient.  Patient presented to [facility] ED on 11/12/20 due to Syncope. Patient was present for 1 hour.   New?Medications Started at [facility] Discharge:?? -started  none  Medication Changes at Hospital Discharge: -Changed  none  Medications Discontinued at Hospital Discharge: -Stopped  none  Medications that remain the same after Hospital Discharge:??  -All other medications will remain the same.    Medications: Outpatient Encounter Medications as of 04/30/2021  Medication Sig   acetaminophen (TYLENOL ARTHRITIS PAIN) 650 MG CR tablet Take 1 tablet (650 mg total) by mouth every 8 (eight) hours as needed. (Patient taking differently: Take 1,300 mg by mouth in the morning and at bedtime.)   carvedilol (COREG) 6.25 MG tablet TAKE ONE TABLET TWICE DAILY WITH A MEAL   Cyanocobalamin (VITAMIN B 12 PO) Take 1,000 mcg by mouth.   ELIQUIS 5 MG TABS tablet TAKE ONE TABLET TWICE DAILY   furosemide (LASIX) 20 MG tablet Take 1 tablet (20 mg total) by mouth daily. Take 1 tablet for 4 days   levETIRAcetam 750 MG TB24 Take 1 tablet (750 mg total) by mouth at bedtime.   loratadine (CLARITIN) 10 MG tablet Take 10 mg by mouth daily as needed.   lovastatin (MEVACOR) 40 MG tablet TAKE TWO TABLETS EACH NIGHT AT BEDTIME   mirabegron ER (MYRBETRIQ) 50 MG TB24 tablet Take 1 tablet (50 mg total) by  mouth daily.   Multiple Vitamins-Minerals (PRESERVISION AREDS) CAPS Take 1 capsule by mouth 2 (two) times daily.    UNABLE TO FIND Bevacizumab(AVASTIN) chemo injection for  the eye every 10 weeks   No facility-administered encounter medications on file as of 04/30/2021.  Fill History : CARVEDILOL 6.25 MG TAB 02/18/2021 90   ELIQUIS 5 MG TAB 04/24/2021 30   FUROSEMIDE 20 MG TAB 03/24/2021 30   LEVETIRACETAM ER 750 MG TAB 04/03/2021 30   LOVASTATIN 40 MG TAB 04/03/2021 90   MYRBETRIQ 50 MG TAB 04/15/2021 30     Initial Appointment is same day Per Jeni Salles Initial questions not completed*   Care Gaps: Zoster Vaccines - Overdue TDAP - Overdue Eye Exam - DONE HGB A1C - Overdue  BP - 197/77 ( 04/15/21) Lab Results  Component Value Date   HGBA1C 5.6 09/08/2020    Star Rating Drugs: Lovastatin (Mevacor) 40 mg - Last filled 04/03/21 90 DS at [place]   Patient Assistance: Did not speak to patient *  [place] Clinical Pharmacist Assistant [phone]

## 2021-05-06 ENCOUNTER — Other Ambulatory Visit: Payer: Medicare Other

## 2021-05-07 NOTE — Patient Instructions (Addendum)
Hi Jeffrey [name],  It was great to get to meet you in person! Below is a summary of some of the topics we discussed.  Keep on checking your blood pressures and try to keep a log of the readings. Also, please make sure to print out an updated list of your medications to make sure you are getting them all in every day to use as your checklist.  Please reach out to me if you have any questions or need anything before our follow up!  Best, Maddie  Jeni Salles, PharmD, [facility] [phone]   Visit Information   Goals Addressed             This Visit's Progress    Manage My Medicine       Timeframe:  Long-Range Goal Priority:  Medium Start Date:                             Expected End Date:                       Follow Up Date 08/27/21    - keep a list of all the medicines I take; vitamins and herbals too - learn to read medicine labels    Why is this important?   These steps will help you keep on track with your medicines.   Notes:      Track and Manage My Blood Pressure-Hypertension       Timeframe:  Long-Range Goal Priority:  High Start Date:                             Expected End Date:                       Follow Up Date 06/29/21    - check blood pressure 3 times per week - choose a place to take my blood pressure (home, clinic or office, retail store) - write blood pressure results in a log or diary    Why is this important?   You won't feel high blood pressure, but it can still hurt your blood vessels.  High blood pressure can cause heart or kidney problems. It can also cause a stroke.  Making lifestyle changes like losing a little weight or eating less salt will help.  Checking your blood pressure at home and at different times of the day can help to control blood pressure.  If the doctor prescribes medicine remember to take it the way the doctor ordered.  Call the office if you cannot afford the medicine or if  there are questions about it.     Notes:        Patient Care Plan: CCM Pharmacy Care Plan     Problem Identified: Problem: Hypertension, Hyperlipidemia, Diabetes, Atrial Fibrillation, Heart Failure, Overactive Bladder, and Seizures      Long-Range Goal: Patient-Specific Goal   Start Date: 04/30/2021  Expected End Date: 04/30/2022  This Visit's Progress: On track  Priority: High  Note:   Current Barriers:  Unable to independently monitor therapeutic efficacy Unable to achieve control of blood pressure   Pharmacist Clinical Goal(s):  Patient will achieve adherence to monitoring guidelines and medication adherence to achieve therapeutic efficacy achieve control of blood pressure as evidenced by home and office readings  through collaboration with PharmD and provider.  Interventions: 1:1 collaboration with Rita Ohara, MD regarding development and update of comprehensive plan of care as evidenced by provider attestation and co-signature Inter-disciplinary care team collaboration (see longitudinal plan of care) Comprehensive medication review performed; medication list updated in electronic medical record  Hypertension (BP goal <140/90) -Uncontrolled -Current treatment: Furosemide 20 mg 1 tablet daily Carvedilol 6.25 mg 1 tablet twice daily -Medications previously tried: unknown  -Current home readings: could not provide -Current dietary habits: lots of frozen meals; canned soups sometimes  -Current exercise habits: walking a few minutes a day -Denies hypotensive/hypertensive symptoms -Educated on BP goals and benefits of medications for prevention of heart attack, stroke and kidney damage; Importance of home blood pressure monitoring; Proper BP monitoring technique; -Counseled to monitor BP at home twice weekly, document, and provide log at future appointments -Counseled on diet and exercise extensively Recommended to continue current medication  Hyperlipidemia: (LDL goal <  70) -Controlled -Current treatment: Lovastatin 40 mg 2 tablets at bedtime -Medications previously tried: n/a  -Current dietary patterns: does eat fried foods and uses the cheapest oil (sometimes vegetable oil) -Current exercise habits: walking a few minutes a day -Educated on Cholesterol goals;  Benefits of statin for ASCVD risk reduction; Importance of limiting foods high in cholesterol; Exercise goal of 150 minutes per week; -Counseled on diet and exercise extensively Recommended to continue current medication  Diabetes (A1c goal <7%) -Controlled -Current medications: No medications -Medications previously tried: unknown  -Current home glucose readings fasting glucose: does not check often post prandial glucose: does not check often -Denies hypoglycemic/hyperglycemic symptoms -Current meal patterns:  breakfast: did not discuss  lunch: did not discuss  dinner: did not discuss snacks: did not discuss drinks: did not discuss -Current exercise: walking a few minutes per day -Educated on A1c and blood sugar goals; Carbohydrate counting and/or plate method -Counseled to check feet daily and get yearly eye exams -Counseled on diet and exercise extensively  Atrial Fibrillation (Goal: prevent stroke and major bleeding) -Controlled -CHADSVASC: 7 -Current treatment: Rate control: Carvedilol 6.25 mg 1 tablet twice daily Anticoagulation: Eliquis 5 mg 1 tablet twice daily -Medications previously tried: none -Home BP and HR readings: refer to above  -Counseled on increased risk of stroke due to Afib and benefits of anticoagulation for stroke prevention; importance of adherence to anticoagulant exactly as prescribed; bleeding risk associated with Eliquis and importance of self-monitoring for signs/symptoms of bleeding; -Counseled on diet and exercise extensively Recommended to continue current medication Assessed patient finances. Patient reports Eliquis can be expensive at  times.  Heart Failure (Goal: manage symptoms and prevent exacerbations) -Controlled -Last ejection fraction: 55-60% (Date: 08/08/20) -HF type: Diastolic -NYHA Class: II (slight limitation of activity) -AHA HF Stage: C (Heart disease and symptoms present) -Current treatment: Furosemide 20 mg 1 tablet daily Carvedilol 6.25 mg 1 tablet twice daily -Medications previously tried: n/a  -Current home BP/HR readings: refer to above -Current dietary habits: eats out often and frozen meals -Current exercise habits: a little bit of walking daily -Educated on Benefits of medications for managing symptoms and prolonging life Importance of weighing daily; if you gain more than 3 pounds in one day or 5 pounds in one week, call cardiologist. -Counseled on diet and exercise extensively Recommended to continue current medication  Overactive bladder (Goal: minimize symptoms) -Controlled -Current treatment  Myrbetriq 50 mg 1 tablet daily -Medications previously tried: none  -Recommended to continue current medication  Seizures (Goal: prevent seizures) -Controlled -Current treatment  Levetiracetam 750 mg 1 tablet at  bedtime -Medications previously tried: none  -Assessed formulary for alternatives to see if anything is less expensive.  Health Maintenance -Vaccine gaps: shingrix, tetanus -Current therapy:  Preservision 1 capsule twice daily Vitamin B12 1000 mcg daily Loratadine 10 mg 1 tablet daily as needed Acetaminophen 650 mg as needed Betamethasone ointment 0.05%  -Educated on Cost vs benefit of each product must be carefully weighed by individual consumer -Patient is satisfied with current therapy and denies issues -Recommended to continue current medication  Patient Goals/Self-Care Activities Patient will:  - take medications as prescribed as evidenced by patient report and record review check blood pressure twice weekly, document, and provide at future appointments target a minimum  of 150 minutes of moderate intensity exercise weekly  Follow Up Plan: The care management team will reach out to the patient again over the next 30 days.       Jeffrey [name] was given information about Chronic Care Management services today including:  CCM service includes personalized support from designated clinical staff supervised by his physician, including individualized plan of care and coordination with other care providers 24/7 contact phone numbers for assistance for urgent and routine care needs. Standard insurance, coinsurance, copays and deductibles apply for chronic care management only during months in which we provide at least 20 minutes of these services. Most insurances cover these services at 100%, however patients may be responsible for any copay, coinsurance and/or deductible if applicable. This service may help you avoid the need for more expensive face-to-face services. Only one practitioner may furnish and bill the service in a calendar month. The patient may stop CCM services at any time (effective at the end of the month) by phone call to the office staff.  Patient agreed to services and verbal consent obtained.   The patient verbalized understanding of instructions, educational materials, and care plan provided today and agreed to receive a mailed copy of patient instructions, educational materials, and care plan.  The pharmacy team will reach out to the patient again over the next 30 days.   Viona Gilmore, [facility]

## 2021-05-13 ENCOUNTER — Encounter: Payer: Self-pay | Admitting: *Deleted

## 2021-05-26 ENCOUNTER — Other Ambulatory Visit: Payer: Medicare Other

## 2021-05-26 ENCOUNTER — Other Ambulatory Visit: Payer: Self-pay | Admitting: Physician Assistant

## 2021-05-27 ENCOUNTER — Telehealth: Payer: Self-pay | Admitting: Cardiovascular Disease

## 2021-05-27 MED ORDER — APIXABAN 5 MG PO TABS: 5.0000 mg | ORAL_TABLET | Freq: Two times a day (BID) | ORAL | 1 refills | Status: DC

## 2021-05-27 NOTE — Telephone Encounter (Signed)
°*  STAT* If patient is at the pharmacy, call can be transferred to refill team.   1. Which medications need to be refilled? (please list name of each medication and dose if known) eliquis 5 MG 1 tablet twice daily   2. Which pharmacy/location (including street and city if local pharmacy) is medication to be sent to? [facility] on [address]   3. Do they need a 30 day or 90 day supply? 30 day

## 2021-05-27 NOTE — Telephone Encounter (Signed)
Refill sent to wrong pharmacy  Please res[facility] to [facility] on [address]

## 2021-05-27 NOTE — Progress Notes (Signed)
05/27/21 Per Jeni Salles Call to the office of Wellington Hampshire, MD to request 30 DS of Eliquis be sent over to [place] on [address] in an effort to use Eliquis Copay card for medication, spoke to staff who confirmed that the request will be sent over to the prescribing provider.     [place] Clinical Pharmacist Assistant [phone]

## 2021-05-27 NOTE — Telephone Encounter (Signed)
Prescription refill request for Eliquis received. Indication: Atrial Fib/CVA Last office visit: 04/13/21  R Dunn PA-C Scr: 1.02 on 04/13/21 Age: 87 Weight: 67.6kg  Based on above findings Eliquis 5mg  twice daily is the appropriate dose.  Refill approved.

## 2021-05-27 NOTE — Addendum Note (Signed)
Addended by: Malen Gauze on: 05/27/2021 02:59 PM   Modules accepted: Orders

## 2021-05-27 NOTE — Telephone Encounter (Signed)
Refill request

## 2021-05-27 NOTE — Telephone Encounter (Signed)
Prescription refill request for Eliquis received. Indication:a fib Last office visit: 04/13/21 Scr: 1.02 Age: 87 Weight:  66kg

## 2021-05-28 NOTE — Progress Notes (Signed)
05/28/21 [facility] to [facility] to confirm medication has been received, Pharmacist reports it is in however the Free Trial did not go through as patient has used before in his lifetime. Call to patient to advise he reports ok and would like [facility]. Pharmacist advised.     [place] Clinical Pharmacist Assistant [phone]

## 2021-06-04 ENCOUNTER — Telehealth: Payer: Self-pay | Admitting: Pharmacist

## 2021-06-04 DIAGNOSIS — H353231 Exudative age-related macular degeneration, bilateral, with active choroidal neovascularization: Secondary | ICD-10-CM | POA: Diagnosis not present

## 2021-06-04 NOTE — Progress Notes (Signed)
Name: Jeffrey [name]  MRN: [id number] DOB: [birth year]  Reason for Encounter: Disease State / Hypertension Assessment    Conditions to be addressed/monitored: HTN  Recent office visits:  None  Recent consult visits:  None  Hospital visits:  None in previous 6 months  Medications: Outpatient Encounter Medications as of 06/04/2021  Medication Sig   acetaminophen (TYLENOL ARTHRITIS PAIN) 650 MG CR tablet Take 1 tablet (650 mg total) by mouth every 8 (eight) hours as needed. (Patient taking differently: Take 500 mg by mouth in the morning and at bedtime.)   apixaban (ELIQUIS) 5 MG TABS tablet Take 1 tablet (5 mg total) by mouth 2 (two) times daily.   carvedilol (COREG) 6.25 MG tablet TAKE ONE TABLET TWICE DAILY WITH A MEAL   Cyanocobalamin (VITAMIN B 12 PO) Take 1,000 mcg by mouth.   furosemide (LASIX) 20 MG tablet Take 1 tablet (20 mg total) by mouth daily. Take 1 tablet for 4 days (Patient not taking: Reported on 05/07/2021)   levETIRAcetam 750 MG TB24 Take 1 tablet (750 mg total) by mouth at bedtime.   loratadine (CLARITIN) 10 MG tablet Take 10 mg by mouth daily as needed.   lovastatin (MEVACOR) 40 MG tablet TAKE TWO TABLETS EACH NIGHT AT BEDTIME   mirabegron ER (MYRBETRIQ) 50 MG TB24 tablet Take 1 tablet (50 mg total) by mouth daily.   Multiple Vitamins-Minerals (PRESERVISION AREDS) CAPS Take 1 capsule by mouth 2 (two) times daily.    UNABLE TO FIND Bevacizumab(AVASTIN) chemo injection for the eye every 10 weeks   No facility-administered encounter medications on file as of 06/04/2021.  Reviewed chart prior to disease state call. Spoke with patient regarding BP  Recent Office Vitals: BP Readings from Last 3 Encounters:  04/30/21 (!) 160/85  04/15/21 (!) 197/77  04/13/21 (!) 150/90   Pulse Readings from Last 3 Encounters:  04/15/21 65  04/13/21 66  03/24/21 74    Wt Readings from Last 3 Encounters:  04/15/21 146 lb (66.2 kg)  04/13/21 149 lb (67.6 kg)  03/24/21 151  lb 2 oz (68.5 kg)     Kidney Function Lab Results  Component Value Date/Time   CREATININE 1.02 04/13/2021 12:04 PM   CREATININE 1.10 04/08/2021 11:03 AM   CREATININE 0.78 05/27/2017 10:07 AM   CREATININE 0.92 11/18/2016 10:55 AM   GFRNONAA >60 11/12/2020 11:39 AM   GFRNONAA >60 10/01/2013 05:19 PM   GFRAA 92 01/23/2020 11:51 AM   GFRAA >60 10/01/2013 05:19 PM    BMP Latest Ref Rng & Units 04/13/2021 04/08/2021 03/24/2021  Glucose 70 - 99 mg/dL 87 77 117(H)  BUN 8 - 27 mg/dL 21 27 20   Creatinine 0.76 - 1.27 mg/dL 1.02 1.10 0.87  BUN/Creat Ratio 10 - 24 21 25(H) 23  Sodium 134 - 144 mmol/L 140 141 141  Potassium 3.5 - 5.2 mmol/L 4.8 4.3 4.6  Chloride 96 - 106 mmol/L 102 101 102  CO2 20 - 29 mmol/L 24 23 26   Calcium 8.6 - 10.2 mg/dL 9.1 9.2 9.6    Current antihypertensive regimen:  Furosemide 20 mg 1 tablet daily Carvedilol 6.25 mg 1 tablet twice daily How often are you checking your Blood Pressure? weekly Current home BP readings: Patient reports his highest reading since his appointment was 149/88 but the most recent one last week was 132/70, he denies any headache, dizziness or lightheadedness. He reports he just feels like he does not have much energy otherwise well. He is  to see the Eye Dr this morning as well. What recent interventions/DTPs have been made by any provider to improve Blood Pressure control since last CPP Visit: Patient reports none Any recent hospitalizations or ED visits since last visit with CPP? None What diet changes have been made to improve Blood Pressure Control?  Patient reports his wife does the cooking and they eat out every now and again What exercise is being done to improve your Blood Pressure Control?  Patient reports he does not have much energy but he does get out with the dog regularly  Adherence Review: Is the patient currently on ACE/ARB medication? No Does the patient have >5 day gap between last estimated fill dates? No    Care  Gaps: Zoster Vaccines - Overdue TDAP - Overdue Eye Exam - DONE HGB A1C - Overdue  BP - 197/77 ( 04/15/21) Home 06/04/21 132/70 Lab Results  Component Value Date   HGBA1C 5.6 09/08/2020    Star Rating Drugs: Lovastatin (Mevacor) 40 mg - Last filled 04/03/21 90 DS at [facility]   Patient Assistance: None  [place] Clinical Pharmacist Assistant [phone]

## 2021-06-04 NOTE — Chronic Care Management (AMB) (Addendum)
A user error has taken place: encounter opened in error, closed for administrative reasons.

## 2021-06-25 ENCOUNTER — Other Ambulatory Visit: Payer: Self-pay

## 2021-06-25 ENCOUNTER — Ambulatory Visit: Payer: Medicare Other

## 2021-06-25 ENCOUNTER — Ambulatory Visit (INDEPENDENT_AMBULATORY_CARE_PROVIDER_SITE_OTHER): Payer: Medicare Other

## 2021-06-25 DIAGNOSIS — I350 Nonrheumatic aortic (valve) stenosis: Secondary | ICD-10-CM

## 2021-06-25 DIAGNOSIS — I6522 Occlusion and stenosis of left carotid artery: Secondary | ICD-10-CM

## 2021-06-25 DIAGNOSIS — I480 Paroxysmal atrial fibrillation: Secondary | ICD-10-CM

## 2021-06-25 LAB — VAS US CAROTID: Single Plane A4C EF: 53 %

## 2021-06-26 ENCOUNTER — Other Ambulatory Visit: Payer: Self-pay | Admitting: *Deleted

## 2021-06-26 DIAGNOSIS — M7582 Other shoulder lesions, left shoulder: Secondary | ICD-10-CM | POA: Diagnosis not present

## 2021-06-26 DIAGNOSIS — I340 Nonrheumatic mitral (valve) insufficiency: Secondary | ICD-10-CM

## 2021-06-26 DIAGNOSIS — I6522 Occlusion and stenosis of left carotid artery: Secondary | ICD-10-CM

## 2021-06-26 DIAGNOSIS — I350 Nonrheumatic aortic (valve) stenosis: Secondary | ICD-10-CM

## 2021-06-26 DIAGNOSIS — M7522 Bicipital tendinitis, left shoulder: Secondary | ICD-10-CM | POA: Diagnosis not present

## 2021-06-26 DIAGNOSIS — M75101 Unspecified rotator cuff tear or rupture of right shoulder, not specified as traumatic: Secondary | ICD-10-CM | POA: Diagnosis not present

## 2021-06-26 DIAGNOSIS — M12811 Other specific arthropathies, not elsewhere classified, right shoulder: Secondary | ICD-10-CM | POA: Diagnosis not present

## 2021-06-26 DIAGNOSIS — M19011 Primary osteoarthritis, right shoulder: Secondary | ICD-10-CM | POA: Diagnosis not present

## 2021-06-26 MED ORDER — ATORVASTATIN CALCIUM 20 MG PO TABS: 20.0000 mg | ORAL_TABLET | Freq: Every day | ORAL | 3 refills | Status: DC

## 2021-06-26 NOTE — Progress Notes (Signed)
Recall placed

## 2021-07-01 ENCOUNTER — Telehealth: Payer: Self-pay | Admitting: *Deleted

## 2021-07-01 NOTE — Telephone Encounter (Signed)
Patient advised.

## 2021-07-01 NOTE — Telephone Encounter (Signed)
Advise pt--I reviewed his recent carotid US, where the recommendation was noted (in the results).  I think it is very reasonable for him to try the atorvastatin.  He has been on the lovastatin for so long, I don't know if he ever tried atorvastatin in the past.  It is a low dose, but actually as strong as the lovastatin, and fewer pills.    If he doesn't tolerate it (due to significant side effects), then he can always switch back to the lovastatin.  I agree with trial of atorvastatin.

## 2021-07-01 NOTE — Telephone Encounter (Signed)
Patient called to let you know that his statin was changed to atorvastain 20mg  by Christell Faith, PA (cardio in [place]). He has had it filled but has not started it as of yet. Wanted to run it by you first as he had a lot of issues with intolerance in the past. Please advise, thanks.

## 2021-07-10 ENCOUNTER — Other Ambulatory Visit: Payer: Self-pay

## 2021-07-10 ENCOUNTER — Emergency Department: Payer: Medicare Other

## 2021-07-10 ENCOUNTER — Emergency Department
Admission: EM | Admit: 2021-07-10 | Discharge: 2021-07-10 | Disposition: A | Discharge status: Home / Self Care | Payer: Medicare Other | Attending: Emergency Medicine | Admitting: Emergency Medicine

## 2021-07-10 DIAGNOSIS — S0101XA Laceration without foreign body of scalp, initial encounter: Secondary | ICD-10-CM | POA: Diagnosis not present

## 2021-07-10 DIAGNOSIS — I10 Essential (primary) hypertension: Secondary | ICD-10-CM | POA: Insufficient documentation

## 2021-07-10 DIAGNOSIS — E119 Type 2 diabetes mellitus without complications: Secondary | ICD-10-CM | POA: Diagnosis not present

## 2021-07-10 DIAGNOSIS — Z23 Encounter for immunization: Secondary | ICD-10-CM | POA: Insufficient documentation

## 2021-07-10 DIAGNOSIS — M25512 Pain in left shoulder: Secondary | ICD-10-CM | POA: Insufficient documentation

## 2021-07-10 DIAGNOSIS — W19XXXA Unspecified fall, initial encounter: Secondary | ICD-10-CM

## 2021-07-10 DIAGNOSIS — S0990XA Unspecified injury of head, initial encounter: Secondary | ICD-10-CM | POA: Insufficient documentation

## 2021-07-10 DIAGNOSIS — S199XXA Unspecified injury of neck, initial encounter: Secondary | ICD-10-CM | POA: Diagnosis not present

## 2021-07-10 DIAGNOSIS — M19012 Primary osteoarthritis, left shoulder: Secondary | ICD-10-CM | POA: Diagnosis not present

## 2021-07-10 DIAGNOSIS — Z7901 Long term (current) use of anticoagulants: Secondary | ICD-10-CM | POA: Diagnosis not present

## 2021-07-10 DIAGNOSIS — R52 Pain, unspecified: Secondary | ICD-10-CM | POA: Diagnosis not present

## 2021-07-10 DIAGNOSIS — W01198A Fall on same level from slipping, tripping and stumbling with subsequent striking against other object, initial encounter: Secondary | ICD-10-CM | POA: Diagnosis not present

## 2021-07-10 DIAGNOSIS — S0003XA Contusion of scalp, initial encounter: Secondary | ICD-10-CM | POA: Diagnosis not present

## 2021-07-10 DIAGNOSIS — I959 Hypotension, unspecified: Secondary | ICD-10-CM | POA: Diagnosis not present

## 2021-07-10 DIAGNOSIS — S4992XA Unspecified injury of left shoulder and upper arm, initial encounter: Secondary | ICD-10-CM | POA: Diagnosis not present

## 2021-07-10 MED ORDER — LIDOCAINE-EPINEPHRINE 2 %-1:100000 IJ SOLN: 20.0000 mL | Freq: Once | INTRAMUSCULAR | Status: DC

## 2021-07-10 MED ORDER — TETANUS-DIPHTH-ACELL PERTUSSIS 5-2.5-18.5 LF-MCG/0.5 IM SUSY
0.5000 mL | PREFILLED_SYRINGE | Freq: Once | INTRAMUSCULAR | Status: AC
  Administered 2021-07-10: 0.5 mL via INTRAMUSCULAR
  Filled 2021-07-10: qty 0.5

## 2021-07-10 MED ORDER — LIDOCAINE-EPINEPHRINE-TETRACAINE (LET) TOPICAL GEL
3.0000 mL | Freq: Once | TOPICAL | Status: AC
  Administered 2021-07-10: 3 mL via TOPICAL
  Filled 2021-07-10: qty 3

## 2021-07-10 MED ORDER — BACITRACIN-NEOMYCIN-POLYMYXIN 400-5-5000 EX OINT
TOPICAL_OINTMENT | Freq: Once | CUTANEOUS | Status: DC
Start: 2021-07-10 — End: 2021-07-10
  Filled 2021-07-10: qty 1

## 2021-07-10 NOTE — ED Provider Notes (Signed)
[facility] Provider Note    Event Date/Time   First MD Initiated Contact with Patient 07/10/21 1347     (approximate)   History   Fall   HPI  Jeffrey [name] is a 88 y.o. male with history of hypertension, diabetes, TIA, and previous CVA presents emergency department after a fall today.  Patient states he had on new boots and tripped and fell backwards.  Hit the back of his head.  Patient is on a blood thinner.  Patient also states that his left shoulder is hurting from the fall.  States it hurts with movement.  He denies chest pain/shortness of breath.  No LOC.  His Tdap is not up-to-date.      Physical Exam   Triage Vital Signs: ED Triage Vitals  Enc Vitals Group     BP 07/10/21 1349 (!) 168/82     Pulse Rate 07/10/21 1349 70     Resp 07/10/21 1349 17     Temp 07/10/21 1349 98.4 F (36.9 C)     Temp Source 07/10/21 1349 Oral     SpO2 07/10/21 1349 100 %     Weight 07/10/21 1352 145 lb 15.1 oz (66.2 kg)     Height 07/10/21 1352 5\' 11"  (1.803 m)     Head Circumference --      Peak Flow --      Pain Score 07/10/21 1350 0     Pain Loc --      Pain Edu? --      Excl. in [place]? --     Most recent vital signs: Vitals:   07/10/21 1609 07/10/21 1610  BP: (!) 162/82   Pulse: 76   Resp: 16   Temp:  98 F (36.7 C)  SpO2: 99%      General: Awake, no distress.   CV:  Good peripheral perfusion. regular rate and  rhythm Resp:  Normal effort. Lungs CTA Abd:  No distention.   Other:  Left shoulder is tender to palpation, C-spine is tender, large laceration noted to the posterior scalp, active bleeding is noted, cranial nerves II through XII are grossly intact   ED Results / Procedures / Treatments   Labs (all labs ordered are listed, but only abnormal results are displayed) Labs Reviewed - No data to display   EKG     RADIOLOGY CT of the head, C-spine, and x-ray of the left shoulder    PROCEDURES:   .Marland KitchenLaceration  Repair  Date/Time: 07/10/2021 3:50 PM Performed by: Versie Starks, PA-C Authorized by: Versie Starks, PA-C   Consent:    Consent obtained:  Verbal   Consent given by:  Patient   Risks discussed:  Infection, pain, retained foreign body, poor cosmetic result, need for additional repair, poor wound healing and vascular damage   Alternatives discussed:  No treatment Universal protocol:    Procedure explained and questions answered to patient or proxy's satisfaction: yes     Patient identity confirmed:  Verbally with patient Anesthesia:    Anesthesia method:  Topical application   Topical anesthetic:  LET Laceration details:    Location:  Scalp   Scalp location:  Crown   Length (cm):  3 Pre-procedure details:    Preparation:  Patient was prepped and draped in usual sterile fashion and imaging obtained to evaluate for foreign bodies Exploration:    Limited defect created (wound extended): no     Hemostasis achieved with:  LET and direct pressure  Imaging outcome: foreign body not noted     Wound exploration: wound explored through full range of motion     Wound extent: no areolar tissue violation noted, no fascia violation noted, no foreign bodies/material noted, no muscle damage noted, no nerve damage noted, no tendon damage noted, no underlying fracture noted and no vascular damage noted   Treatment:    Area cleansed with:  Saline   Amount of cleaning:  Extensive   Irrigation solution:  Sterile saline   Irrigation method:  Tap Skin repair:    Repair method:  Staples   Number of staples:  4 Approximation:    Approximation:  Close Repair type:    Repair type:  Simple Post-procedure details:    Dressing:  Antibiotic ointment and non-adherent dressing   Procedure completion:  Tolerated well, no immediate complications   MEDICATIONS ORDERED IN ED: Medications  lidocaine-EPINEPHrine (XYLOCAINE W/EPI) 2 %-1:100000 (with pres) injection 20 mL (20 mLs Infiltration Not Given  07/10/21 1609)  neomycin-bacitracin-polymyxin (NEOSPORIN) ointment packet (has no administration in time range)  lidocaine-EPINEPHrine-tetracaine (LET) topical gel (3 mLs Topical Given 07/10/21 1450)  Tdap (BOOSTRIX) injection 0.5 mL (0.5 mLs Intramuscular Given 07/10/21 1557)     IMPRESSION / MDM / ASSESSMENT AND PLAN / ED COURSE  I reviewed the triage vital signs and the nursing notes.                              Differential diagnosis includes, but is not limited to, subdural, SAH, C-spine fracture, skull fracture, left shoulder fracture, laceration  Patient appears to be stable, will await imaging and will repair the laceration.  X-ray of the left shoulder was reviewed by me.  Do not see a fracture.  Awaiting radiologist read.  Radiology read was negative. CT of the head and C-spine were reviewed by me, radiologist read as no acute abnormality.  See procedure note for laceration repair  Patient was given a Tdap here in the ED  Prior to discharge the patient is continue to complain of musculoskeletal pain of the left arm and left shoulder.  I did offer CT of the shoulder.  He is deferring at this time.  However is agreeable to x-ray of the left humerus and left forearm.  I do feel he mostly has a bruise but due to the concerns of the amount of pain in the left arm we will do the x-rays.  I still anticipate him being discharged.  On my review of the x-ray of the left forearm shows minimal fluid in the joint of the left elbow, x-ray of the left humerus appears to be negative.  Radiologist reads humerus is negative, possible fluid in left elbow.  I did explain these findings to the patient and his family.  They are to apply ice to all areas that hurt.  Due to his age and being a little unstable when he walks I do not feel a sling is proper for him at this time.  It may cause him to fall again.  He is to change the bandage tomorrow.  Return if worsening.  Follow-up with orthopedics if  continued orthopedic type pain.  Discharged in stable condition.  I do not feel that he needs to be admitted as he is stable and not confused.  CTs are negative so he does not have a head bleed.  He is able to ambulate without difficulty.  FINAL CLINICAL IMPRESSION(S) / ED DIAGNOSES   Final diagnoses:  Fall, initial encounter  Scalp laceration, initial encounter  Injury of head, initial encounter     Rx / DC Orders   ED Discharge Orders     None        Note:  This document was prepared using Dragon voice recognition software and may include unintentional dictation errors.    Versie Starks, PA-C 07/10/21 1613    Blake Divine, MD 07/11/21 [phone]

## 2021-07-10 NOTE — ED Triage Notes (Signed)
BIB EMS from home for fall. No LOC. Pt has laceration to posterior side of the head. Pt vitals WNL for EMS. BGL 118. Pt is on blood thinners.

## 2021-07-10 NOTE — Discharge Instructions (Addendum)
Keep the areas dry as possible.  Do not wash his hair for close to 24 hours.  Be very careful with the comb so that you not pull on the staples.  He will not need to apply Neosporin after today.  Just keep the area as dry as possible. Apply ice to all areas that hurt, eliquis causes deep bruising when you fall , if you continue to have pain in 4 to 5 days then follow up with orthopedics

## 2021-07-13 ENCOUNTER — Telehealth: Payer: Self-pay | Admitting: Adult Health

## 2021-07-13 NOTE — Telephone Encounter (Signed)
Ladies Patient has an appt 2/20 with Janett Billow and he had a bad fall last week. The daughter said he hit his head and they took him to ER and they did a cat scan but found nothing. He is sore alert and sleeping a lot which is the concern. They want to keep the appt and wanted some advice. The Er did not advise to go to Neuro/Just Primary to remove stitches.  Thanks

## 2021-07-13 NOTE — Telephone Encounter (Signed)
My chart message sent to the pt's daughter on this.

## 2021-07-15 ENCOUNTER — Inpatient Hospital Stay: Payer: Medicare Other | Admitting: Family Medicine

## 2021-07-15 NOTE — Progress Notes (Signed)
07/16/2021 1:07 PM   Jeffrey [name] 1933-02-06 [id number]  Referring provider: Rita Ohara, [address] [place],  [place] [address]  Chief Complaint  Patient presents with   Prostate Cancer   Urological history 1. Prostate cancer -PSA <0.1 in 08/2020 -Completed IMRT + low-dose brachytherapy for Gleason 3+4 prostate cancer March 2011  2.  Urethral stricture -s/p internal urethrotomy and circumcision in September 2018  3. LU TS -contributing factors of age, prostate cancer, pelvic radiation, OSA, HTN, CHF, stroke, diuretics and diabetes -I PSS 10/5 -PVR 0 mL -Managed with Myrbetriq 50 mg daily  HPI: Jeffrey [name] is a 88 y.o. male who presents today for a three month follow up after a trial of Myrbetriq 50 mg daily with his wife, Jeffrey [name].    He has been without the Myrbetriq 50 mg daily for two months.  He feels that the Myrbetriq was helpful in decreasing the amount of nocturia.  He was getting up 5 to 6 times and now he is down to 3 or 4.    Patient denies any modifying or aggravating factors.  Patient denies any gross hematuria, dysuria or suprapubic/flank pain.  Patient denies any fevers, chills, nausea or vomiting.     IPSS     Row Name 07/16/21 1100         International Prostate Symptom Score   How often have you had the sensation of not emptying your bladder? Not at All     How often have you had to urinate less than every two hours? About half the time     How often have you found you stopped and started again several times when you urinated? Less than half the time     How often have you found it difficult to postpone urination? Less than 1 in 5 times     How often have you had a weak urinary stream? Not at All     How often have you had to strain to start urination? Not at All     How many times did you typically get up at night to urinate? 4 Times     Total IPSS Score 10       Quality of Life due to urinary symptoms   If you were to spend the  rest of your life with your urinary condition just the way it is now how would you feel about that? Unhappy              Score:  1-7 Mild 8-19 Moderate 20-35 Severe     PMH: Past Medical History:  Diagnosis Date   Acute CVA (cerebrovascular accident) ([place])    Acute encephalopathy 11/27/2014   Aortic atherosclerosis ([place]) 11/25/2014   Atrial fibrillation ([place])    a. 12/2018 [facility]: Sinus rhythm, avg rate 71. 2 runs of VT (max 17 beats). 27 runs SVT (max 14 beats). Occas PACs/PVCs. No high grade AV block/pauses.   Chronic combined systolic (congestive) and diastolic (congestive) heart failure ([place])    a. 10/2014 Echo: EF 40-45%; b. 03/2018 Echo: EF 55-60%; c. 12/2018 Echo: EF 55-60%, mild conc LVH. Nl RV fxn. RVSP 51.66mmHg. Mildly dil LA. Mod AS w/ severe Ca2+; d. 12/2019 Echo: EF 50-55%, mild LVH, gr2 DD. Sev elev PASP. Mild MR, mild to mod TR. Triv AI, mod to sev AS (area 0.77cm^2, mean grad 59mmHg. Vmax 2.18m/s).   Diabetes mellitus    Hypercholesteremia    Hypertension    Impaired fasting glucose  Macular degeneration    Moderate to severe aortic stenosis    a. 12/2018 Echo: EF 55-60%, Mod AS w/ sev AoV Ca2+; b. 12/2019 Echo: EF 50-55%. Mod to sev AS (area 0.77cm^2, mean grad 62mmHg. Vmax 2.30m/s).   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   COLONOSCOPY N/A 11/28/2012   Procedure: COLONOSCOPY;  Surgeon: Jerene Bears, MD;  Location: [facility] ENDOSCOPY;  Service: Gastroenterology;  Laterality: N/A;   INTERNAL URETHROTOMY  01/27/2016   WF   prostate seed implant     ROTATOR CUFF REPAIR  06/16/2011   right (Dr. Berenice Primas)   TONSILLECTOMY AND ADENOIDECTOMY  age 37    Home Medications:  Allergies as of 07/16/2021   No Known Allergies      Medication List        Accurate as of July 16, 2021  1:07 PM. If you have any questions, ask your nurse or doctor.          acetaminophen 650 MG CR tablet Commonly known as: Tylenol Arthritis Pain Take 1 tablet (650 mg total) by mouth every 8 (eight) hours as needed. What changed:  how much to take when to take this   apixaban 5 MG Tabs tablet Commonly known as: Eliquis Take 1 tablet (5 mg total) by mouth 2 (two) times daily.   atorvastatin 20 MG tablet Commonly known as: LIPITOR Take 1 tablet (20 mg total) by mouth daily.   carvedilol 6.25 MG tablet Commonly known as: COREG TAKE ONE TABLET TWICE DAILY WITH A MEAL   furosemide 20 MG tablet Commonly known as: LASIX Take 1 tablet (20 mg total) by mouth daily. Take 1 tablet for 4 days   Levetiracetam 750 MG Tb24 Take 1 tablet (750 mg total) by mouth at bedtime.   loratadine 10 MG tablet Commonly known as: CLARITIN Take 10 mg by mouth daily as needed.   mirabegron ER 50 MG Tb24 tablet Commonly known as: MYRBETRIQ Take 1 tablet (50 mg total) by mouth daily.   PreserVision AREDS Caps Take 1 capsule by mouth 2 (two) times daily.   UNABLE TO FIND Bevacizumab(AVASTIN) chemo injection for the eye every 10 weeks   VITAMIN B 12 PO Take 1,000 mcg by mouth.        Allergies: No Known Allergies  Family History: Family History  Problem Relation Age of Onset   Heart disease Father 54       Died suddenly.  No diagnosis   Stroke Sister    Dementia Mother    Diabetes Mother    Heart disease Mother     Hypertension Mother    Cancer Daughter        ovarian (?) vs other male cancer; s/p hyst doing well   Diabetes Daughter        GDM, and now AODM  Heart disease Son        congestive heart failure--improved   Congestive Heart Failure Grandchild     Social History:  reports that he quit smoking about 41 years ago. His smoking use included cigarettes. He has a 20.00 pack-year smoking history. He has never used smokeless tobacco. He reports current alcohol use of about 14.0 standard drinks per week. He reports that he does not use drugs.  ROS: Pertinent ROS in HPI  Physical Exam: BP 137/82    Pulse 76    Ht 5\' 11"  (1.803 m)    Wt 140 lb (63.5 kg)    BMI 19.53 kg/m   Constitutional:  Well nourished. Alert and oriented, No acute distress. HEENT: [place] AT, mask in place.  Trachea midline Cardiovascular: No clubbing, cyanosis, or edema. Respiratory: Normal respiratory effort, no increased work of breathing. Neurologic: Grossly intact, no focal deficits, moving all 4 extremities. Psychiatric: Normal mood and affect.  Laboratory Data: Lab Results  Component Value Date   WBC 7.9 02/18/2021   HGB 13.7 02/18/2021   HCT 41.2 02/18/2021   MCV 98 (H) 02/18/2021   PLT 280 02/18/2021    Lab Results  Component Value Date   CREATININE 1.02 04/13/2021    Lab Results  Component Value Date   HGBA1C 5.6 09/08/2020    Lab Results  Component Value Date   TSH 1.920 09/08/2020       Component Value Date/Time   CHOL 154 09/08/2020 1617   CHOL 184 10/01/2013 1719   HDL 70 09/08/2020 1617   HDL 55 10/01/2013 1719   CHOLHDL 2.2 09/08/2020 1617   CHOLHDL 2.3 05/27/2017 1007   VLDL 28 06/02/2016 0714   VLDL 80 (H) 10/01/2013 1719   LDLCALC 68 09/08/2020 1617   LDLCALC 78 05/27/2017 1007   LDLCALC 49 10/01/2013 1719    Lab Results  Component Value Date   AST 19 02/18/2021   Lab Results  Component Value Date   ALT 11 02/18/2021  I have reviewed the labs.   Pertinent Imaging:   07/16/21 11:39  Scan Result 54mL   Assessment & Plan:    1. Prostate cancer -PSA pending  2.  LUTS -PVR < 300 cc -most bothersome symptoms are nocturia -continue conservative management, avoiding bladder irritants and timed voiding's  3. Urethral stricture -No complaints of splitting stream -PVR minimal -I did explain that since he is on an OAB agent and has a history of strictures we should see him on a more regular basis to ensure his residuals are not increasing  4. Nocturia -I explained to the patient that since he does not sleep with his CPAP machine, his untreated sleep apnea is likely the contributing factor to his nocturia.  I discussed the mechanism of action for the sleep apnea causing nocturia.  He states that wearing the CPAP machine was intolerable.  It had been a while since he had worn the CPAP machine and I did offer him referral to pulmonology for retesting and also to see if new or masks which would be more comfortable were available at this time.  He declined. -He would like to continue the Myrbetriq 50 mg nightly as he states it has reduced the nocturia   Return in about 3 months (around 10/13/2021) for IPSS and PVR.  These notes generated with voice recognition software. I apologize for typographical errors.  Zara Council, PA-C  [facility] [address]  [place], [place] [address] [phone]

## 2021-07-16 ENCOUNTER — Ambulatory Visit (INDEPENDENT_AMBULATORY_CARE_PROVIDER_SITE_OTHER): Payer: Medicare Other | Admitting: Urology

## 2021-07-16 ENCOUNTER — Other Ambulatory Visit: Payer: Self-pay

## 2021-07-16 ENCOUNTER — Encounter: Payer: Self-pay | Admitting: Urology

## 2021-07-16 VITALS — BP 137/82 | HR 76 | Ht 71.0 in | Wt 140.0 lb

## 2021-07-16 DIAGNOSIS — C61 Malignant neoplasm of prostate: Secondary | ICD-10-CM | POA: Diagnosis not present

## 2021-07-16 DIAGNOSIS — R351 Nocturia: Secondary | ICD-10-CM | POA: Diagnosis not present

## 2021-07-16 DIAGNOSIS — I6522 Occlusion and stenosis of left carotid artery: Secondary | ICD-10-CM | POA: Diagnosis not present

## 2021-07-16 DIAGNOSIS — R399 Unspecified symptoms and signs involving the genitourinary system: Secondary | ICD-10-CM | POA: Diagnosis not present

## 2021-07-16 LAB — BLADDER SCAN AMB NON-IMAGING

## 2021-07-16 MED ORDER — MIRABEGRON ER 50 MG PO TB24: 50.0000 mg | ORAL_TABLET | Freq: Every day | ORAL | 3 refills | Status: DC

## 2021-07-17 LAB — PSA: Prostate Specific Ag, Serum: <0.1 ng/mL (ref 0.0–4.0)

## 2021-07-20 ENCOUNTER — Encounter: Payer: Self-pay | Admitting: Adult Health

## 2021-07-20 ENCOUNTER — Ambulatory Visit (INDEPENDENT_AMBULATORY_CARE_PROVIDER_SITE_OTHER): Payer: Medicare Other | Admitting: Adult Health

## 2021-07-20 VITALS — BP 185/98 | HR 74 | Ht 71.0 in | Wt 143.0 lb

## 2021-07-20 DIAGNOSIS — R251 Tremor, unspecified: Secondary | ICD-10-CM

## 2021-07-20 DIAGNOSIS — R200 Anesthesia of skin: Secondary | ICD-10-CM

## 2021-07-20 DIAGNOSIS — G40109 Localization-related (focal) (partial) symptomatic epilepsy and epileptic syndromes with simple partial seizures, not intractable, without status epilepticus: Secondary | ICD-10-CM | POA: Diagnosis not present

## 2021-07-20 NOTE — Patient Instructions (Signed)
Your Plan:  Continue current treatment plan - no changes today     Follow up in 1 year or call earlier if needed     Thank you for coming to see [place] at [facility]. I hope we have been able to provide you high quality care today.  You may receive a patient satisfaction survey over the next few weeks. We would appreciate your feedback and comments so that we may continue to improve ourselves and the health of our patients.

## 2021-07-20 NOTE — Progress Notes (Signed)
[facility] [address] [place]. [place] [address] [phone]       OFFICE FOLLOW UP NOTE  Jeffrey. Jeffrey [name] Date of Birth:  Oct 27, 1932 Medical Record Number:  [id number]   Referring MD:   Jeffrey [name] Reason for visit: Seizures [facility] provider: Dr. Leonie [name]   Chief Complaint  Patient presents with   Follow-up    Rm 3 with wife here for a 4 month f/u- Reports he has been doing ok since last visit. Denies any seizures tremors are worse at times but feels relatively stable       HPI:   Initial visit 05/02/2018 PS: Jeffrey [name] is a pleasant 88 year old Caucasian male was seen today for initial office consultation visit.  He is accompanied by his wife and daughter.  History is obtained from them and review of referral notes.  I have reviewed imaging films in PACS.  He has been having multiple brief episodes of altered awareness for several years but these appear to have increased in the last 6 months and frequency as well as duration.  He had a prolonged episode recently in November 2019 when he went to the ER.  CT scan of the head was obtained which I reviewed showed no acute abnormality except changes of age-related small vessel disease.  He is recently had a few back-to-back episodes as well.  He is unable to identify specific trigger for his episodes.  He is usually staring during these episodes and does not respond to commands.  He does not lose consciousness fall or hurt himself.  His episodes were previously lasting less than a minute the dose of few recent ones have been more prolonged.  Following some of these episodes he tends to get agitated and disoriented.  The patient's daughter feels she is noticed that he does some automatic hand movements and some of these episodes.  He denies any headache or any aura prior to these episodes.  He has no remote history of head injury with loss of consciousness intracranial hemorrhage or strokes.  There is no family history of  epilepsy or seizures.  There is no specific pattern for these episodes which may occur once every few months to several in a week.  He has not had an EEG or MRI scan done or a trial of seizure medications. He had prior history of right anterior frontal lacunar infarct in 11/27/2014 he was felt to have asymptomatic 80 to 90% left ICA stenosis at that time.  Echocardiogram was normal.  He has remote history of [name] spotted fever with encephalitis.  He was treated with a 2-week course of doxycycline.  He did not have any definite seizures at that time.  He had episode of brief staring with a blank look on his face in May 2016 and was admitted to [name] where he had an EEG which was negative for seizures.  He was seen by Dr. Theador [name] neurologist as an outpatient and had a negative work-up for his seizures as well.  He was referred to Dr. Juanda [name] feels vascular surgeon for left carotid stenosis follow-up with ultrasound in the office showed stenosis to be much less and hence conservative follow-up was recommended.  He was seen by me in the office in March 2017 but has not followed up since then.  He was found to subsequently have a transient episode of paroxysmal A. fib and was started on Eliquis by cardiologist Dr. Fletcher [name].  He also had a second  EEG done in September 2016 which was also unremarkable except for mild generalized slowing.  Update 08/07/2018 PS : He returns for follow-up after last visit 3 months ago.  He is accompanied by his wife.  He states he is doing well.  Is tolerating Keppra XR 500 mg quite well without any side effects.  He has had no spells like seizures or speech difficulties since the last visit.  He has had no stroke or TIA symptoms either.  He underwent EEG on 06/12/2018 which was read by me and was normal.  MRI scan of the brain done on 06/14/2018 which I have personally reviewed the images shows tiny right posterior temporal white matter lacunar infarct which  was clinically silent as patient had no symptoms at that time.  Carotid ultrasound previously done on 12/23/2017 had shown 40 to 59% left ICA stenosis no significant stenosis on the right.  Transthoracic echo done on 04/05/2018 and showed normal ejection fraction.  Lipid profile done on 07/05/2018 was unremarkable with LDL cholesterol of 66 mg percent, HDL of 59 and total cholesterol 152 mg percent.  Hemoglobin A1c on 06/28/2018 was 6.0.  Patient has been diagnosed with sleep apnea by his pulmonologist Dr Leonidas [name] and has started using CPAP every night recently.  He has no new complaints today.  He states his tremors are unchanged and are not functionally disabling.  He does follow-up with Jeffrey [name] from vascular surgery for carotid stenosis and will have follow-up carotid ultrasound done in upcoming visit in the spring  Update 02/20/2019 Jeffrey [name]: Jeffrey. [name] is a 88 year old male who is being seen today for follow-up accompanied by wife.  He has been doing well from a neurological standpoint.  He continues on Keppra XR 500 mg daily without recurrent seizure episodes or speech difficulties.  He did have a ED visit on 12/29/2018 for syncopal episode which was felt likely due to orthostatic hypotension with cardiology follow-up and medications adjusted.  Syncopal episode was not felt to be related to seizures.  Denies new or recurring stroke/TIA symptoms as well.  Upper extremity L>R tremors have been stable and do not interfere with daily functioning.  He remains on Eliquis without bleeding or bruising.  He endorses ongoing compliance with CPAP for OSA management.  Blood pressure today elevated at 189/90 but does endorse elevation at doctor's appointments.  He does monitor at home and typically 120s/70s.  Denies new or worsening stroke/TIA symptoms.  Update 09/03/2019 Dr. Leonie [name]: He returns for follow-up after last visit 6 months ago.  He states he is doing well but wife states that a few weeks ago she noticed episode of transient  unresponsiveness where he was staring ahead and not immediately responsive this lasted only a few moments and then returned back to baseline but patient did not remember that.  There were no automatic movements or lipsmacking noted.  There is no confusion or disorientation or headache.  There is no apparent trigger.  He remains on Keppra Exar 500 mg daily and is tolerating well without side effects.  He remains on Eliquis for his A. fib and has not had any stroke or TIA symptoms.  His blood pressure is significantly elevated today with the patient's wife claims that this on whitecoat hypertension as it is much better at home.  He continues to have intermittent hand tremor but this is not functionally disabling.  He has no other new complaints.  Update 03/05/2020 Dr. Leonie [name]: He returns for follow-up after last visit 6 months  ago.  Is accompanied by his wife.  Wife feels that these episodes of transient dizziness and unresponsiveness and staring ahead is still occurring but she is not sure how frequently is happening.  She gets this is probably once a month or so.  He has tolerated increasing the dose of Keppra XR 2000 mg daily without side effects.  He continues to have mild tremors in his hands and head but these are not interfering with day-to-day activities and remain unchanged.  Is tolerating Eliquis well without bruising or bleeding.  He had a repeat EEG done on 11/15/2019 which was normal.  He has no new complaints.  Update 09/03/2020 Jeffrey [name]: Returns for 34-month follow-up.  Stable since prior visit without any reoccurring seizures or symptoms.  Remains on Keppra XR but per patient report, only taking 1 tablet at night. At prior visit, reported taking 2 tab for 1000mg  dosing but he denies ever taking 2 tablets at night.  He is tolerating current dose of Keppra without side effects.  Chronic mild upper extremity tremor with mild progression but does not interfer with daily activity. Hx of A. fib on Eliquis routinely  monitored by cardiology.  No concerns at this time.  Update 03/09/2021 [name]: Returns for 47-month follow-up accompanied by his wife.  ED eval 11/12/2020 for unresponsive episode with eyes open, staring and altered awareness after mowing his grass. Noted to have taken his initial dose of silodosin for his BPH that same day.  Event similar to his prior episodes.   Reports occasional feeling of "swimmy headedness" when stands too quickly and with increased exposure to heat/sunshine. Per wife, he had an additional event (similar to the one in June) with sensation of dizziness/lightheadedness, impairment of consciousness with unresponsive, and staring.  He does not remember these episodes occurring.  Lasting only short duration.  Denies actual loss of consciousness or passing out, falling, tongue biting, weakness, or autonomic movements. Wife and son able to assist pt back into house and after sitting and drinking water, shortly returned back to baseline.  He will occasionally feel lightheadedness not associated with staring or altered consciousness episodes.  He does admit to limited fluid intake drinking approximately 4-8 oz of water daily. Admits to 1-2 beers/day and occasionally a mixed drink (states "it doesn't make my doctor happy but it makes me happy").   Reports compliance on Keppra XR 500 mg nightly - denies any missed dosages.  He was previously advised to increase dose after similar episode occurred in 07/2019 but this never occurred. When seen on 09/03/2020, he denied any additional events in almost 1 year therefore remained on keppra XR 500mg  dosing.   Tremor stable noting improvement since prior visit and can wax/wane.  Does not interfere with daily activity or functioning.  Occasionally can be worse at night - unable to identify trigger.  Recently started on B12 supplement for B12 deficiency.  Reports compliance on Eliquis for secondary stroke prevention measures and history of A. fib managed by  cardiology.  Routinely followed by PCP.  No further concerns at this time.    Update 07/20/2021 Jeffrey [name]: 88 year old male with history of complex partial seizures and tremor.  He is accompanied by his wife. Stable from seizure standpoint, currently taking Keppra XR 750 mg daily without side effects. No additional seizure activity since dosage increase after prior visit.  Continued tremor noting good days/bad days and can be worse at night. He did have a fall on 2/10 with c/o left shoulder and arm  pain - does experience increased shoulder and arm pain at night. Routinely followed by orthopedics receiving bilateral shoulder injections but has not yet been seen since fall. He was evaluated in ED post fall - left shoulder and arm xray which was negative for fracture, CTH unremarkable, CT cervical no acute findings. Otherwise tremors relatively stable, does not interfere with daily activity or functioning. He also mentions over the past 6 months, experiencing sensation of a sock still on his right foot despite being barefoot typically more around his toes. Denies any worsening since onset and denies any painful symptoms.  Denies back pain or traumatic event preceding symptoms. Was started on B12 supplement a couple months ago for B12 deficiency. Hx of prediabetes but otherwise no history of neuropathy or family history of neuropathy.  Of note, blood pressure elevated today but routinely monitors at home and typically stable.  No further concerns at this time.     ROS:   14 system review of systems is positive for those listed in HPI denies and all other systems negative  PMH:  Past Medical History:  Diagnosis Date   Acute CVA (cerebrovascular accident) ([place])    Acute encephalopathy 11/27/2014   Aortic atherosclerosis ([place]) 11/25/2014   Atrial fibrillation ([place])    a. 12/2018 [facility]: Sinus rhythm, avg rate 71. 2 runs of VT (max 17 beats). 27 runs SVT (max 14 beats). Occas PACs/PVCs. No high grade AV block/pauses.    Chronic combined systolic (congestive) and diastolic (congestive) heart failure ([place])    a. 10/2014 Echo: EF 40-45%; b. 03/2018 Echo: EF 55-60%; c. 12/2018 Echo: EF 55-60%, mild conc LVH. Nl RV fxn. RVSP 51.73mmHg. Mildly dil LA. Mod AS w/ severe Ca2+; d. 12/2019 Echo: EF 50-55%, mild LVH, gr2 DD. Sev elev PASP. Mild Jeffrey, mild to mod TR. Triv AI, mod to sev AS (area 0.77cm^2, mean grad 16mmHg. Vmax 2.80m/s).   Diabetes mellitus    Hypercholesteremia    Hypertension    Impaired fasting glucose    Macular degeneration    Moderate to severe aortic stenosis    a. 12/2018 Echo: EF 55-60%, Mod AS w/ sev AoV Ca2+; b. 12/2019 Echo: EF 50-55%. Mod to sev AS (area 0.77cm^2, mean grad 73mmHg. Vmax 2.20m/s).   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) 10/2014   (hosp with FUO, encephalopathy)   Rotator cuff tear, right 10/2010   supraspinatous and infraspinatous   Seizure disorder ([place]) 07/25/2019   TIA (transient ischemic attack) 10/02/13   hosp at [facility] x 1 night   Type 2 diabetes mellitus with microalbuminuria or microproteinuria 01/15/2014    Social History:  Social History   Socioeconomic History   Marital status: Married    Spouse name: [name]   Number of children: 4   Years of education: Not on file   Highest education level: Not on file  Occupational History   Occupation: Retired  Tobacco Use   Smoking status: Former    Packs/day: 1.00    Years: 20.00    Pack years: 20.00    Types: Cigarettes    Quit date: 05/31/1980    Years since quitting: 41.1   Smokeless tobacco: Never  Vaping Use   Vaping Use: Never used  Substance and Sexual Activity   Alcohol use: Yes  Alcohol/week: 14.0 standard drinks    Types: 14 Cans of beer per week    Comment: 2 cans of beer most  days of the week   Drug use: No   Sexual activity: Not Currently    Partners: Female    Comment: issues with ED  Other Topics Concern   Not on file  Social History Narrative   Married. Retired Engineer, structural.  2 sons, 2 daughters (all in [place]), 15 grandchildren, 9 great grandchildren   Lives with wife, 1 dog.   Right Handed   Drinks 2 cups of caffeine daily   Social Determinants of Health   Financial Resource Strain: Low Risk    Difficulty of Paying Living Expenses: Not very hard  Food Insecurity: Not on file  Transportation Needs: No Transportation Needs   Lack of Transportation (Medical): No   Lack of Transportation (Non-Medical): No  Physical Activity: Not on file  Stress: Not on file  Social Connections: Not on file  Intimate Partner Violence: Not on file    Medications:   Current Outpatient Medications on File Prior to Visit  Medication Sig Dispense Refill   acetaminophen (TYLENOL ARTHRITIS PAIN) 650 MG CR tablet Take 1 tablet (650 mg total) by mouth every 8 (eight) hours as needed. (Patient taking differently: Take 500 mg by mouth in the morning and at bedtime.)     apixaban (ELIQUIS) 5 MG TABS tablet Take 1 tablet (5 mg total) by mouth 2 (two) times daily. 180 tablet 1   atorvastatin (LIPITOR) 20 MG tablet Take 1 tablet (20 mg total) by mouth daily. 90 tablet 3   carvedilol (COREG) 6.25 MG tablet TAKE ONE TABLET TWICE DAILY WITH A MEAL 180 tablet 0   Cyanocobalamin (VITAMIN B 12 PO) Take 1,000 mcg by mouth.     furosemide (LASIX) 20 MG tablet Take 1 tablet (20 mg total) by mouth daily. Take 1 tablet for 4 days 30 tablet 0   levETIRAcetam 750 MG TB24 Take 1 tablet (750 mg total) by mouth at bedtime. 30 tablet 5   loratadine (CLARITIN) 10 MG tablet Take 10 mg by mouth daily as needed.     mirabegron ER (MYRBETRIQ) 50 MG TB24 tablet Take 1 tablet (50 mg total) by mouth daily. 90 tablet 3   Multiple Vitamins-Minerals (PRESERVISION AREDS) CAPS Take 1 capsule by mouth 2 (two)  times daily.      UNABLE TO FIND Bevacizumab(AVASTIN) chemo injection for the eye every 10 weeks     No current facility-administered medications on file prior to visit.    Allergies:  No Known Allergies  Physical Exam  Today's Vitals   07/20/21 1126  BP: (!) 185/98  Pulse: 74  Weight: 143 lb (64.9 kg)  Height: 5\' 11"  (1.803 m)   Body mass index is 19.94 kg/m.  General: Very Pleasant frail elderly Caucasian male seated, in no evident distress Head: head normocephalic and atraumatic.   Cardiovascular: regular rate and rhythm Musculoskeletal: limited R>L shoulder ROM (chronic) Skin:  no rash/petichiae Vascular:  Normal pulses all extremities  Neurologic Exam Mental Status: Awake and fully alert.  Fluent speech and language.  Oriented to place and time. Recent and remote memory intact. Attention span, concentration and fund of knowledge appropriate. Mood and affect appropriate.   Cranial Nerves: Pupils equal, briskly reactive to light. Extraocular movements full without nystagmus. Visual fields full to confrontation.  HOH bilaterally. Facial sensation intact. Face, tongue, palate moves normally and symmetrically.  Motor: Normal bulk and tone.  Normal strength in all tested extremity muscles. Mild action tremor of outstretched upper extremities left greater than right. Mild LUE tremor at rest. Intermittent head tremor.  Mild Cogwheel rigidity left greater than right wrist upon activation mainly.  No bradykinesia.   Sensory.: intact to touch , pinprick , position and vibratory sensation.  Coordination: Rapid alternating movements normal in all extremities. Finger-to-nose and heel-to-shin performed accurately bilaterally. Gait and Station: Arises from chair without difficulty. Stance is normal. Gait demonstrates normal stride length and balance but slight diminished left arm swing. No festination or stooped posture.  Moderate difficulty performing heel, toe and tandem walk .   Reflexes: 1+  and symmetric. Toes downgoing.      ASSESSMENT/PLAN: 88 year long-standing recurrent transient stereotypical episodes of altered awareness with speech difficulties and starring possibly complex partial seizures.  He also has mild left greater than right upper extremity and head tremor possibly early parkinsonian tremor vs essential tremor which does not appear to be functionally disabling or progressive     Complex partial seizures -no recent seizure activity -Continue Keppra XR 750 mg nightly for seizure prophylaxis -Concern of recurrence events (10/2020 ED eval and 02/2021) more consistent with partial type seizure - similar episode 07/2019.  No additional events since keppra dosage increase  Tremor -stable with some worsening tremor at night - possibly slightly worsened in setting of LUE pain post fall -Does not interfere with daily activity or functioning -continue to monitor  Right foot numbness -present over past 6 months - not progressive -sensory exam stable today -does have pre-diabetes and B12 deficiency -can consider EMG/NCV in the future if indicated -continue to monitor - advised to call with any worsening     Follow-up in 6 months or call earlier if needed   CC:  Rita Ohara, MD    I spent 36 minutes of face-to-face and non-face-to-face time with patient and wife.  This included previsit chart review, lab review, study review, electronic health record documentation, patient and wife education and discussion regarding hx of seizures and use of AED, seizure triggers and avoidance, chronic tremor and continued monitoring, right foot numbness and possible causes and answered all other questions to patient and wifes satisfaction   Frann Rider, AGNP-BC  [facility] [address] [place], [place] [address]  Phone [phone] Fax [phone] Note: This document was prepared with digital dictation and possible smart phrase technology.  Any transcriptional errors that result from this process are unintentional.

## 2021-07-21 DIAGNOSIS — S0101XD Laceration without foreign body of scalp, subsequent encounter: Secondary | ICD-10-CM | POA: Diagnosis not present

## 2021-08-06 DIAGNOSIS — H353231 Exudative age-related macular degeneration, bilateral, with active choroidal neovascularization: Secondary | ICD-10-CM | POA: Diagnosis not present

## 2021-08-10 DIAGNOSIS — Z20822 Contact with and (suspected) exposure to covid-19: Secondary | ICD-10-CM | POA: Diagnosis not present

## 2021-08-12 ENCOUNTER — Other Ambulatory Visit: Payer: Medicare Other

## 2021-08-14 NOTE — Progress Notes (Signed)
? ?Cardiology Office Note   ? ?Date:  08/17/2021  ? ?ID:  Jeffrey [name], DOB 10/20/1932, MRN [id number] ? ?PCP:  Jeffrey Ohara, MD  ?Cardiologist:  Jeffrey Sacramento, MD  ?Electrophysiologist:  None  ? ?Chief Complaint: Follow up ? ?History of Present Illness:  ? ?Jeffrey [name] is a 88 y.o. male with history of PAF, moderate to severe aortic stenosis, pulmonary hypertension, TIA, CVA in 10/2014, labile hypertension with a component of whitecoat syndrome, hyperlipidemia, moderate left-sided carotid artery stenosis, borderline diabetes, [facility] Spotted Fever, prior tobacco use, and sleep apnea who presents for follow-up of echo and carotid artery ultrasound. ?  ?Nuclear stress test in 12/2014 showed no evidence of ischemia with a normal EF.  Echo in 03/2018 showed normal LV systolic function, moderate aortic stenosis with a mean gradient of 21 mmHg and a valve area of 0.95 cm?.  Pulmonary pressure was moderately elevated at 56 mmHg.  He was seen in the ED in 11/2018 with a possible syncopal episode with low blood pressure.  He had a facial laceration and was instructed to hold Eliquis for 2 days.  He had significant orthostatic hypotension that required decreasing of some of his antihypertensive medications.  Subsequent Zio patch showed sinus rhythm with an average rate of 71 beats p.m.  There were 2 runs of NSVT with the longest episode lasting 17 beats.  In addition, there were 27 runs of SVT with the longest episode lasting 14 beats.  Given the frequent ectopy, it was decided to discontinue lisinopril and amlodipine with recommendation to uptitrate carvedilol.  Echo performed on 08/08/2020 demonstrated an EF of 55 to 60%, no regional wall motion abnormalities, mild LVH, grade 2 diastolic dysfunction, normal RV systolic function and ventricular cavity size, moderately elevated PASP estimated at 50.7 mmHg, mild to moderate mitral valve regurgitation, mild to moderate tricuspid valve regurgitation, severe calcification of  the aortic valve with moderate to severe aortic valve stenosis with a mean gradient of 20.5 mmHg and a valve area by VTI measuring 0.91 cm?. ?  ?He was seen in the ED in 10/2020 with near syncope following his initial dose of silodosin for BPH as well as performing yard work.  High-sensitivity troponin was negative.  CT head showed no acute intracranial abnormality.  EKG showed sinus rhythm with a known right bundle branch block.  There was question if this episode was in relation to his new medication, and he was advised to follow-up as an outpatient. ?  ?He was seen in the office on 03/24/2021 and felt to be volume overloaded.  It was recommended he take 4 days of Lasix 20 mg daily, and echo was ordered which is scheduled for 04/2021.  His weight was down 4 pounds when compared to his visit in 07/2020. ? ?He was last seen on 04/13/2021 and was doing well from a cardiac perspective, without symptoms of angina or decompensation.  He was euvolemic.  His weight was down 2 pounds when compared to his prior visit, which was down 4 pounds when compared to the visit before that.  He was not needing a standing diuretic.  He was maintaining sinus rhythm.  He underwent echo on 06/25/2021, to evaluate his valvular heart disease, which demonstrated an EF of 50 to 55%, no regional wall motion abnormalities, mild LVH, indeterminate LV diastolic function parameters, low normal RV systolic function with mildly enlarged RV cavity size, mildly dilated left atrium, mild mitral regurgitation, and moderate to severe aortic valve stenosis with  a mean gradient of 20 mmHg, peak gradient 36 mmHg, and valve area 0.7 cm?.  Carotid artery ultrasound on 06/25/2021 demonstrated stable RICA stenosis at 1 to 39% with slight progression of LICA stenosis estimated at 60 to 79%. ? ?He was seen in the ED in 07/2021 after sustaining a mechanical fall without LOC.  CT head showed no evidence of acute intracranial abnormality.  Plain film imaging  demonstrated no evidence of fracture within the left upper extremity. ? ?He comes in accompanied by his wife today and is doing well from a cardiac perspective.  No symptoms of angina or decompensation.  He has not had any further falls.  No hematochezia or melena.  His essential tremor is stable.  No dyspnea, palpitations, dizziness, presyncope, or syncope.  He feels like he is stable and at his baseline.  He does not have any active cardiac issues or concerns at this time. ? ? ?Labs independently reviewed: ?03/2021 - BUN 21, serum creatinine 1.02, potassium 4.8 ?01/2021 - albumin 4.7, AST/ALT normal, Hgb 13.7, PLT 280 ?08/2020 - TC 154, TG 85, HDL 70, LDL 68, TSH normal, A1c 5.6 ? ?Past Medical History:  ?Diagnosis Date  ? Acute CVA (cerebrovascular accident) ([place])   ? Acute encephalopathy 11/27/2014  ? Aortic atherosclerosis ([place]) 11/25/2014  ? Atrial fibrillation ([place])   ? a. 12/2018 Zio: Sinus rhythm, avg rate 71. 2 runs of VT (max 17 beats). 27 runs SVT (max 14 beats). Occas PACs/PVCs. No high grade AV block/pauses.  ? Chronic combined systolic (congestive) and diastolic (congestive) heart failure (HCC)   ? a. 10/2014 Echo: EF 40-45%; b. 03/2018 Echo: EF 55-60%; c. 12/2018 Echo: EF 55-60%, mild conc LVH. Nl RV fxn. RVSP 51.37mHg. Mildly dil LA. Mod AS w/ severe Ca2+; d. 12/2019 Echo: EF 50-55%, mild LVH, gr2 DD. Sev elev PASP. Mild MR, mild to mod TR. Triv AI, mod to sev AS (area 0.77cm^2, mean grad 246mg. Vmax 2.9718m.  ? Diabetes mellitus   ? Hypercholesteremia   ? Hypertension   ? Impaired fasting glucose   ? Macular degeneration   ? Moderate to severe aortic stenosis   ? a. 12/2018 Echo: EF 55-60%, Mod AS w/ sev AoV Ca2+; b. 12/2019 Echo: EF 50-55%. Mod to sev AS (area 0.77cm^2, mean grad 84m57m Vmax 2.74m/6m ? Multiple lacunar infarcts (HCC) [place] Obstructive sleep apnea 06/27/2018  ? [name]  ? Radiation proctitis 11/2012  ? treated with APC ablation and Canasa  suppositories (Dr. PyrtlHilarie FredricksonRMSF [facility] 10/2014  ? (hosp with FUO, encephalopathy)  ? Rotator cuff tear, right 10/2010  ? supraspinatous and infraspinatous  ? Seizure disorder (HCC) [place]  ? TIA (transient ischemic attack) 10/02/13  [facility]  ? Type 2 diabetes mellitus with microalbuminuria or microproteinuria 01/15/2014  ? ? ?Past Surgical History:  ?Procedure Laterality Date  ? CATARACT EXTRACTION Right 01/2017  ? CIRCUMCISION  01/2017  ? Dr. DavisRosana HoesOLONOSCOPY N/A 11/28/2012  ? Procedure: COLONOSCOPY;  Surgeon: Jay MJerene Bears  [facility];  Service: Gastroenterology;  Laterality: N/A;  ? INTERNAL URETHROTOMY  01/27/2016  ? [facility]  ? prostate seed implant    ? ROTATOR CUFF REPAIR  06/16/2011  ? right (Dr. GraveBerenice PrimasTONSILLECTOMY AND ADENOIDECTOMY  age 38  ? 16 ?Current Medications: ?Current Meds  ?Medication Sig  ? acetaminophen (TYLENOL) 500 MG tablet Take 500 mg by  mouth 2 (two) times daily.  ? apixaban (ELIQUIS) 5 MG TABS tablet Take 1 tablet (5 mg total) by mouth 2 (two) times daily.  ? atorvastatin (LIPITOR) 20 MG tablet Take 1 tablet (20 mg total) by mouth daily.  ? carvedilol (COREG) 6.25 MG tablet TAKE ONE TABLET TWICE DAILY WITH A MEAL  ? Cyanocobalamin (VITAMIN B 12 PO) Take 1,000 mcg by mouth.  ? furosemide (LASIX) 20 MG tablet Take 1 tablet (20 mg total) by mouth daily. Take 1 tablet for 4 days  ? levETIRAcetam 750 MG TB24 Take 1 tablet (750 mg total) by mouth at bedtime.  ? loratadine (CLARITIN) 10 MG tablet Take 10 mg by mouth daily as needed.  ? mirabegron ER (MYRBETRIQ) 50 MG TB24 tablet Take 1 tablet (50 mg total) by mouth daily.  ? Multiple Vitamins-Minerals (PRESERVISION AREDS) CAPS Take 1 capsule by mouth 2 (two) times daily.   ? UNABLE TO FIND Bevacizumab(AVASTIN) chemo injection for the eye every 10 weeks  ? ? ?Allergies:   Patient has no known allergies.  ? ?Social History  ? ?Socioeconomic History  ? Marital status: Married  ?  Spouse name:  [name]  ? Number of children: 4  ? Years of education: Not on file  ? Highest education level: Not on file  ?Occupational History  ? Occupation: Retired  ?Tobacco Use  ? Smoking status: Former  ?  Packs/day: 1.0

## 2021-08-17 ENCOUNTER — Other Ambulatory Visit: Payer: Self-pay

## 2021-08-17 ENCOUNTER — Ambulatory Visit (INDEPENDENT_AMBULATORY_CARE_PROVIDER_SITE_OTHER): Payer: Medicare Other | Admitting: Physician Assistant

## 2021-08-17 ENCOUNTER — Encounter: Payer: Self-pay | Admitting: Physician Assistant

## 2021-08-17 VITALS — BP 122/67 | HR 67 | Ht 71.0 in | Wt 142.0 lb

## 2021-08-17 DIAGNOSIS — I340 Nonrheumatic mitral (valve) insufficiency: Secondary | ICD-10-CM | POA: Diagnosis not present

## 2021-08-17 DIAGNOSIS — R0989 Other specified symptoms and signs involving the circulatory and respiratory systems: Secondary | ICD-10-CM

## 2021-08-17 DIAGNOSIS — I6522 Occlusion and stenosis of left carotid artery: Secondary | ICD-10-CM | POA: Diagnosis not present

## 2021-08-17 DIAGNOSIS — I5032 Chronic diastolic (congestive) heart failure: Secondary | ICD-10-CM | POA: Diagnosis not present

## 2021-08-17 DIAGNOSIS — E785 Hyperlipidemia, unspecified: Secondary | ICD-10-CM

## 2021-08-17 DIAGNOSIS — I480 Paroxysmal atrial fibrillation: Secondary | ICD-10-CM | POA: Diagnosis not present

## 2021-08-17 DIAGNOSIS — I350 Nonrheumatic aortic (valve) stenosis: Secondary | ICD-10-CM | POA: Diagnosis not present

## 2021-08-17 DIAGNOSIS — I2721 Secondary pulmonary arterial hypertension: Secondary | ICD-10-CM

## 2021-08-17 NOTE — Patient Instructions (Signed)
Medication Instructions:  ?No changes at this time. ? ?*If you need a refill on your cardiac medications before your next appointment, please call your pharmacy* ? ? ?Lab Work: ?Liver & Lipid panel today ? ?If you have labs (blood work) drawn today and your tests are completely normal, you will receive your results only by: ?MyChart Message (if you have MyChart) OR ?A paper copy in the mail ?If you have any lab test that is abnormal or we need to change your treatment, we will call you to review the results. ? ? ?Testing/Procedures: ?Your physician has requested that you have an echocardiogram in July. ? ? Echocardiography is a painless test that uses sound waves to create images of your heart. It provides your doctor with information about the size and shape of your heart and how well your heart?s chambers and valves are working. This procedure takes approximately one hour. There are no restrictions for this procedure. ? ? ? ?Follow-Up: ?At [facility], you and your health needs are our priority.  As part of our continuing mission to provide you with exceptional heart care, we have created designated Provider Care Teams.  These Care Teams include your primary Cardiologist (physician) and Advanced Practice Providers (APPs -  Physician Assistants and Nurse Practitioners) who all work together to provide you with the care you need, when you need it. ? ? ?Your next appointment:   ?August with [name] to reestablish care.  ? ?The format for your next appointment:   ?In Person ? ?Provider:   ?Kathlyn Sacramento, MD ?

## 2021-08-18 ENCOUNTER — Telehealth: Payer: Self-pay | Admitting: Pharmacist

## 2021-08-18 LAB — HEPATIC FUNCTION PANEL
ALT: 7 IU/L (ref 0–44)
AST: 15 IU/L (ref 0–40)
Albumin: 4.5 g/dL (ref 3.6–4.6)
Alkaline Phosphatase: 46 IU/L (ref 44–121)
Bilirubin Total: 0.4 mg/dL (ref 0.0–1.2)
Bilirubin, Direct: 0.13 mg/dL (ref 0.00–0.40)
Total Protein: 6.8 g/dL (ref 6.0–8.5)

## 2021-08-18 LAB — LIPID PANEL
Chol/HDL Ratio: 2.3 ratio (ref 0.0–5.0)
Cholesterol, Total: 137 mg/dL (ref 100–199)
HDL: 60 mg/dL (ref >39)
LDL Chol Calc (NIH): 59 mg/dL (ref 0–99)
Triglycerides: 95 mg/dL (ref 0–149)
VLDL Cholesterol Cal: 18 mg/dL (ref 5–40)

## 2021-08-18 NOTE — Chronic Care Management (AMB) (Signed)
A user error has taken place: encounter opened in error, closed for administrative reasons.

## 2021-08-18 NOTE — Chronic Care Management (AMB) (Signed)
? ? ?Chronic Care Management ?Pharmacy Assistant  ? ?Name: Jeffrey [name]  MRN: [id number] DOB: Aug 23, 1932 ? ?Reason for Encounter: Disease State Hypertension Assessment ?  ?Conditions to be addressed/monitored: ?HTN ? ?Recent office visits:  ?None ? ?Recent consult visits:  ?08/17/21 Sindy Messing (Cardiology) - Patient presented for CHF and other concerns. Decreased Acetaminophen ? ?07/21/21 Roddie Mc, PA  ([facility]) - Patient presented for Staple removal. ? ?07/20/21 Frann Rider, NP (Neurology) - Patient presented for Focal and partial seizures and other concerns. No medication changes. ? ?07/16/21 Laneta Simmers (Urology) - Patient presented for Lower urinary tract symptoms and other concerns. No medication changes. ? ?06/26/21 Poggi, Smith Mince and Lattie Corns (Orthopedic Surg) - Patient presented for Right rotator cuff tear arthropathy and other concerns. No other visit details available. ? ?Hospital visits:  ?Medication Reconciliation was completed by comparing discharge summary, patient?s EMR and Pharmacy list, and upon discussion with patient. ?Patient presented to [facility] ED on 07/10/21 due to Fall. Patient was present for 2 hours.   ? ?New?Medications Started at [facility] Discharge:?? ?-started  ?none ? ?Medication Changes at Hospital Discharge: ?-Changed  ?none ? ?Medications Discontinued at Hospital Discharge: ?-Stopped  ?none ? ?Medications that remain the same after Hospital Discharge:??  ?-All other medications will remain the same.   ? ?Medications: ?Outpatient Encounter Medications as of 08/18/2021  ?Medication Sig  ? acetaminophen (TYLENOL) 500 MG tablet Take 500 mg by mouth 2 (two) times daily.  ? apixaban (ELIQUIS) 5 MG TABS tablet Take 1 tablet (5 mg total) by mouth 2 (two) times daily.  ? atorvastatin (LIPITOR) 20 MG tablet Take 1 tablet (20 mg total) by mouth daily.  ? carvedilol (COREG) 6.25 MG tablet TAKE ONE TABLET TWICE DAILY WITH A MEAL  ?  Cyanocobalamin (VITAMIN B 12 PO) Take 1,000 mcg by mouth.  ? furosemide (LASIX) 20 MG tablet Take 1 tablet (20 mg total) by mouth daily. Take 1 tablet for 4 days  ? levETIRAcetam 750 MG TB24 Take 1 tablet (750 mg total) by mouth at bedtime.  ? loratadine (CLARITIN) 10 MG tablet Take 10 mg by mouth daily as needed.  ? mirabegron ER (MYRBETRIQ) 50 MG TB24 tablet Take 1 tablet (50 mg total) by mouth daily.  ? Multiple Vitamins-Minerals (PRESERVISION AREDS) CAPS Take 1 capsule by mouth 2 (two) times daily.   ? UNABLE TO FIND Bevacizumab(AVASTIN) chemo injection for the eye every 10 weeks  ? ?No facility-administered encounter medications on file as of 08/18/2021.  ?Reviewed chart prior to disease state call. Spoke with patient regarding BP ? ?Recent Office Vitals: ?BP Readings from Last 3 Encounters:  ?08/17/21 122/67  ?07/20/21 (!) 185/98  ?07/16/21 137/82  ? ?Pulse Readings from Last 3 Encounters:  ?08/17/21 67  ?07/20/21 74  ?07/16/21 76  ?  ?Wt Readings from Last 3 Encounters:  ?08/17/21 142 lb (64.4 kg)  ?07/20/21 143 lb (64.9 kg)  ?07/16/21 140 lb (63.5 kg)  ?  ? ?Kidney Function ?Lab Results  ?Component Value Date/Time  ? CREATININE 1.02 04/13/2021 12:04 PM  ? CREATININE 1.10 04/08/2021 11:03 AM  ? CREATININE 0.78 05/27/2017 10:07 AM  ? CREATININE 0.92 11/18/2016 10:55 AM  ? GFRNONAA >60 11/12/2020 11:39 AM  ? GFRNONAA >60 10/01/2013 05:19 PM  ? GFRAA 92 01/23/2020 11:51 AM  ? GFRAA >60 10/01/2013 05:19 PM  ? ? ?BMP Latest Ref Rng & Units 04/13/2021 04/08/2021 03/24/2021  ?Glucose 70 - 99 mg/dL 87  77 117(H)  ?BUN 8 - 27 mg/dL '21 27 20  '$ ?Creatinine 0.76 - 1.27 mg/dL 1.02 1.10 0.87  ?BUN/Creat Ratio 10 - 24 21 25(H) 23  ?Sodium 134 - 144 mmol/L 140 141 141  ?Potassium 3.5 - 5.2 mmol/L 4.8 4.3 4.6  ?Chloride 96 - 106 mmol/L 102 101 102  ?CO2 20 - 29 mmol/L '24 23 26  '$ ?Calcium 8.6 - 10.2 mg/dL 9.1 9.2 9.6  ? ? ?Current antihypertensive regimen:  ?Furosemide 20 mg 1 tablet daily ?Carvedilol 6.25 mg 1 tablet twice  daily ?How often are you checking your Blood Pressure? 3-5x per week ?Current home BP readings: Patient reports his pressures are averaging on the higher end of the 130'2 for the most part 135/75 138/70 he reports no higher than 140/75 ad denies any hypotensive symptoms. ?What recent interventions/DTPs have been made by any provider to improve Blood Pressure control since last CPP Visit: Patient reports no changes. ?Any recent hospitalizations or ED visits since last visit with CPP? Yes ?What diet changes have been made to improve Blood Pressure Control?  ?Patient reports his appetite remains and he is eating but has lost some weight recently. ?What exercise is being done to improve your Blood Pressure Control?  ?He reports he is still getting out with his dog and walks  ? ?Adherence Review: ?Is the patient currently on ACE/ARB medication? No ?Does the patient have >5 day gap between last estimated fill dates? No ? ? ?Care Gaps: ?Zoster Vaccine - Overdue ?Eye Exam - Overdue ?HGB A1C - Overdue ?Urine Micro - Almost Due ?AWV- 4/22 ?CCM- 6/23 ?BP- 135/75 ( home 08/18/21) ? ?Star Rating Drugs: ?Lovastatin (Mevacor) 40 mg - Last filled 04/03/21 90 DS at [facility] ? ? ? ?Ned Clines CMA ?Clinical Pharmacist Assistant ?[phone] ? ?

## 2021-08-27 ENCOUNTER — Other Ambulatory Visit: Payer: Self-pay | Admitting: Physician Assistant

## 2021-09-03 ENCOUNTER — Ambulatory Visit (INDEPENDENT_AMBULATORY_CARE_PROVIDER_SITE_OTHER): Payer: Medicare Other | Admitting: Podiatry

## 2021-09-03 ENCOUNTER — Encounter: Payer: Self-pay | Admitting: Podiatry

## 2021-09-03 DIAGNOSIS — B351 Tinea unguium: Secondary | ICD-10-CM

## 2021-09-03 DIAGNOSIS — M79674 Pain in right toe(s): Secondary | ICD-10-CM | POA: Diagnosis not present

## 2021-09-03 DIAGNOSIS — E1151 Type 2 diabetes mellitus with diabetic peripheral angiopathy without gangrene: Secondary | ICD-10-CM | POA: Diagnosis not present

## 2021-09-03 DIAGNOSIS — M79675 Pain in left toe(s): Secondary | ICD-10-CM

## 2021-09-03 NOTE — Progress Notes (Signed)
This patient returns to my office for at risk foot care.  This patient requires this care by a professional since this patient will be at risk due to having type 2 diabetes     This patient is unable to cut nails himself since the patient cannot reach his nails.These nails are painful walking and wearing shoes.  This patient presents for at risk foot care today. ? ?General Appearance  Alert, conversant and in no acute stress. ? ?Vascular  Dorsalis pedis and posterior tibial  pulses are  weakly palpable  bilaterally.  Capillary return is within normal limits  bilaterally. Temperature is within normal limits  bilaterally. ? ?Neurologic  Senn-Weinstein monofilament wire test within normal limits  bilaterally. Muscle power within normal limits bilaterally. ? ?Nails Thick disfigured discolored nails with subungual debris  from hallux to fifth toes bilaterally. No evidence of bacterial infection or drainage bilaterally.  Subungual hematoma which is dried with no fluctuation. ? ?Orthopedic  No limitations of motion  feet .  No crepitus or effusions noted.  No bony pathology or digital deformities noted. ? ?Skin  normotropic skin with no porokeratosis noted bilaterally.  No signs of infections or ulcers noted.    ? ?Onychomycosis  Pain in right toes  Pain in left toes  ? ?Consent was obtained for treatment procedures.   Mechanical debridement of nails 1-5  bilaterally performed with a nail nipper.  Filed with dremel without incident.   ? ? ?Return office visit   4  months.                  Told patient to return for periodic foot care and evaluation due to potential at risk complications. ? ? ?Gardiner Barefoot DPM  ?

## 2021-09-08 NOTE — Progress Notes (Signed)
?Chief Complaint  ?Patient presents with  ? AWV  ?  AWV. Still having issues with shoulder. No longer sees Pulmonology, GI or Dentist. Sees Eye doctor every 10 weeks. Will get shingles shot at pharmacy since its now $0. No refills needed today on meds. Will request eye exam  ? ? ?Jeffrey [name] is a 88 y.o. male who presents for annual wellness visit and follow-up on chronic medical conditions.   ? ?He fell 07/10/2021.  He tripped and fell, hit the back of his head, requiring staples. He had negative head CT, xrays of neck, L shoulder.  He was given TdaP. He has ongoing L shoulder pain--worse since he fell. He has an appointment with ortho in May. (Had seen ortho for shoulder pain prior to fall, had gotten injection; did schedule sooner f/u, as he can only get injections q3 mos, per pt). ?No further falls. ? ?Atrial fibrillation, HFpEF/pulmonary hypertension, hypertension and aortic stenosis: He last saw his cardiologist last month. He was euvolemic, not requiring daily diuretic. ?He has mod-severe AS, but has been asymptomatic--no syncope, angina or dyspnea.  Echo is planned for July. ?He remains in NSR, on carvedilol and apixaban.  He denies any bleeding.  He did have a fall in February, as reported above--if he continues to have falls, may need to reconsider if the risk outweighs benefit of [place]. ?He has had labile BP's, with significant white coat component, though this has improved. ?BP's at home are running 100-140/60's-80's; mostly 130/70's. ? ?Has had issues with orthostasis in the past. He has noticed this again--when he stands after prolonged sitting.  He can go slower and be okay.  He notices this more since he stopped drinking as much water. He used to drink a couple of large glasses of water daily. He reports that now he mainly drinks sweet tea (when out), and beer (1/day).  Only 1 glass of water daily.  ?He denies headaches, syncope, chest pain, palpitations, edema. ? ?H/o Stroke (10/2014) and  seizures--He last saw neurologist in 07/2021. 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 He hasn't had any seizures, taking '750mg'$  Keppra.   ?He remains on Eliquis for anticoagulation related to afib for prevention of further strokes.  He denies any bleeding, bruising. Plan is for recheck of carotid artery [place] in 05/2022 (as per cardiology note). ?He has had B12 deficiency, and is taking a supplement. ?Lab Results  ?Component Value Date  ? [id number] 02/18/2021  ? ?He has persistent tremor, but reports it is not bothersome; mild progression noted but no treatment was recommended. It comes and goes, some times better than others. ?His right foot numbness was also discussed at last neuro visit-->6 mos, not progressive, with pre-DM and B12 deficiency.  To consider EMG/NCV in future, if indicated. This is mainly noticeable only when he is barefoot. ?  ?IFG--He doesn't check his sugars.  He goes to the eye doctor regularly, for macular degeneration ([name]), last Avastin injection 07/2021. L eye is getting worse, vision in R eye is better.  Very little vision on the left.  ?He denies any foot tingling/burning or sores. Does have some neuropathy (feeling like wearing sock/extra skin), now mainly on the right. ?He is under the care of a podiatrist. ?He has urinary frequency x several years (related to prostate). He no longer has as much frequency during the day, now mainly only at night.  Denies polydipsia. ? ?Microalbuminuria  noted in 04/2017, in 05/2018 was normal.  Urine microalbumin/Cr ratio was up to 31 on last check in 07/2019, when no longer taking lisinopril. Last check was normal, ratio of 15, in 08/2020. ?  ?Hyperlipidemia:  Compliant with medications. Lovastatin was changed to '20mg'$  of atorvastatin in 05/2021 by his cardiologist. He denies side effects. Tries to follow a lowfat, low cholesterol diet. Lipids were at goal on recent  check. ? ?Lab Results  ?Component Value Date  ? CHOL 137 08/17/2021  ? HDL 60 08/17/2021  ? [place] 59 08/17/2021  ? TRIG 95 08/17/2021  ? CHOLHDL 2.3 08/17/2021  ?  ?Prostate cancer:  He is now under the care of [facility], last seen in 07/2021 (previously under the care of [name]). They prescribed Myrbetriq '50mg'$ , which improved his nocturia, down to 3-4x/night from 5-6 times/night.  They explained that untreated OSA is likely contributing to his nocturia, and encouraged him to re-try CPAP (and offered pulm referral). ?Lab Results  ?Component Value Date  ? PSA1 <0.1 07/16/2021  ? PSA1 <0.1 09/08/2020  ? ? ?OSA and Pulmonary hypertension:  He admits that he has not been using his CPAP. ?"The treatment is worse than the cure".  States he tried  two different masks, couldn't tolerate them. ? ? ?Immunization History  ?Administered Date(s) Administered  ? Influenza Split 01/30/2012, 02/03/2014, 03/25/2015  ? Influenza, High Dose Seasonal PF 03/17/2016, 03/04/2017, 02/22/2018, 02/29/2020, 02/19/2021  ? Influenza-Unspecified 01/29/2013, 03/25/2015, 03/14/2019  ? PFIZER Comirnaty(Gray Top)Covid-19 Tri-Sucrose Vaccine 09/08/2020  ? PFIZER(Purple Top)SARS-COV-2 Vaccination 06/06/2019, 06/27/2019, 03/07/2020  ? Pension scheme manager 23yr & up 02/19/2021  ? Pneumococcal Conjugate-13 10/17/2014  ? Pneumococcal Polysaccharide-23 12/17/2004, 10/02/2012  ? Td 10/29/2004  ? Tdap 07/10/2021  ? ?Last colonoscopy: 11/2012 with Dr. PHilarie Fredrickson(radiation proctitis)   ?Last PSA: per urologist, <0.01 07/2021 ?Dentist: past due, >1 year, dentist retired. ?Ophtho: twice yearly, though now getting injections frequently. Vision is improved on R, worse on the L ?Exercise: Walks the dog usually 15-20 minutes 2-3x/day, daily. Admits it is not aerobic (stops/sniffs a lot, slow).  Has some hand weights, uses 2x/week. ? ?Patient Care Team: ?KRita Ohara MD as PCP - General (Family Medicine) ?AWellington Hampshire MD as PCP -  Cardiology (Cardiology) ?AWellington Hampshire MD as Consulting Physician (Cardiology) ?SGarvin Fila MD as Consulting Physician (Neurology) ?PViona Gilmore [facility] (Pharmacist) ?GI--Dr. PHilarie Fredrickson (no longer sees) ?[facility]  ?Dentist: Dr.Taloopis ?[facility] ?Pulmonary: Dr. SAlva Garnet(no longer sees) ?Vascular: Dr. FOneida Alar?[facility]  [facility]?Podiatrist: Dr. MPrudence Davidson? ?Depression Screening: [place] ?PHQ-2 Total Score 0  ? ?  ?  ?Falls screen:  ? ?  09/09/2021  ?  1:25 PM 03/11/2021  ? 11:19 AM 02/18/2021  ?  2:20 PM 09/08/2020  ?  2:45 PM 02/12/2020  ?  9:23 AM  ?Fall Risk   ?Falls in the past year? 1 0 0 0   ?Number falls in past yr: 1 0 0 0 1  ?Injury with Fall? 1 0 0 0 0  ?Risk for fall due to : Impaired balance/gait No Fall Risks No Fall Risks No Fall Risks   ?Follow up Falls evaluation completed Falls evaluation completed Falls evaluation completed Falls evaluation completed   ?  ? ?Functional Status Survey: ?Is the patient deaf or have difficulty hearing?: Yes ?Does the  patient have difficulty seeing, even when wearing glasses/contacts?: Yes ?Does the patient have difficulty concentrating, remembering, or making decisions?: No ?Does the patient have difficulty walking or climbing stairs?: No ?Does the patient have difficulty dressing or bathing?: No ?Does the patient have difficulty doing errands alone such as visiting a doctor's office or shopping?: No ? ?Functional status survey--notable for decreased hearing and vision (macular degeneration); leakage of urine; Some short-term memory issues (forgetting names of people he hasn't seen in a long time, sometimes forgetting why he walked into a room). No longer drives. ? ?Mini-Cog Scoring: 3  ? Clock was incorrect--numbers placed correctly, but not the hands (time was supposed to be 2:30, put one  small hand at 2, and the other between 2 and 3, not at the 6). ? ? ?End of Life Discussion:  He has been given forms in the past, but still hasn't done. Requesting new forms. ? ?PMH, PSH, SH and FH were reviewed and up

## 2021-09-08 NOTE — Patient Instructions (Addendum)
?HEALTH MAINTENANCE RECOMMENDATIONS: ? ?It is recommended that you get at least 30 minutes of aerobic exercise at least 5 days/week (for weight loss, you may need as much as 60-90 minutes). This can be any activity that gets your heart rate up. This can be divided in 10-15 minute intervals if needed, but try and build up your endurance at least once a week.  Weight bearing exercise is also recommended twice weekly. ? ?Eat a healthy diet with lots of vegetables, fruits and fiber.  "Colorful" foods have a lot of vitamins (ie green vegetables, tomatoes, red peppers, etc).  Limit sweet tea, regular sodas and alcoholic beverages, all of which has a lot of calories and sugar.  Up to 2 alcoholic drinks daily may be beneficial for men (unless trying to lose weight, watch sugars).  Drink a lot of water. ? ?Sunscreen of at least SPF 30 should be used on all sun-exposed parts of the skin when outside between the hours of 10 am and 4 pm (not just when at beach or pool, but even with exercise, golf, tennis, and yard work!)  Use a sunscreen that says "broad spectrum" so it covers both UVA and UVB rays, and make sure to reapply every 1-2 hours. ? ?Remember to change the batteries in your smoke detectors when changing your clock times in the spring and fall.  Carbon monoxide detectors are recommended for your home. ? ?Use your seat belt every time you are in a car, and please drive safely and not be distracted with cell phones and texting while driving. ? ? ? ?[name] , ?Thank you for taking time to come for your Medicare Wellness Visit. I appreciate your ongoing commitment to your health goals. Please review the following plan we discussed and let me know if I can assist you in the future.  ? ?This is a list of the screening recommended for you and due dates:  ?Health Maintenance  ?Topic Date Due  ? Zoster (Shingles) Vaccine (1 of 2) Never [name]  ? Eye exam for diabetics  08/29/2017  ? Hemoglobin A1C  03/10/2021  ? Urine Protein  Check  09/08/2021  ? Flu Shot  12/29/2021  ? Complete foot exam   12/29/2021  ? Tetanus Vaccine  07/11/2031  ? Pneumonia Vaccine  Completed  ? COVID-19 Vaccine  Completed  ? HPV Vaccine  Aged Out  ? ?Please bring [place] copies of your Living Will and [place] once completed and notarized so that it can be scanned into your medical chart. ? ?I recommend getting the new shingles vaccine (Shingrix). Since you have Medicare, you will need to get this from the pharmacy, as it is covered by Part D.  As of 05/31/21, there is no longer an out-of-pocket cost for this vaccine. ?This is a series of 2 injections, spaced 2 months apart.   ?This should be separated from other vaccines by at least 2 weeks. ? ?Be sure to do range of motion exercises of the left shoulder, as we discussed. This will help prevent getting "frozen shoulder".  Follow up with your orthopedist as planned. ? ?Please try and drink more water.  This is especially important as the temperatures warm up.  Try and drink at least 48 ounces of water daily (six 8 ounce cups). Use your urine as a gauge for how much water you need to drink.  The goal is to have very pale yellow urine (drink lots of water to get that). ?You  need more water for every cup of coffee, tea or beer that you have. ?So, drink less of those, and more water. ?Drinking more water helps your bowels stay softer. ?Be sure to get plenty of fiber in your diet. ? ?Please schedule a routine dental exam. ?You can ask if they also do oral appliances for the treatment of sleep apnea since you will not use the CPAP for treatment.  ? ?You declined referral for working with a nutritionist to ensure that you are getting adequate dietary intake of calories and nutrients. I'm concerned because of your weight loss.  You need to be eating more frequently, and getting enough vegetables, fruit protein in your diet.  Please try and drink 1 supplemental shake daily (ie Ensure or other similar protein  shakes). ?If you aren't able to maintain your weight, we will do the referral at your next visit. ? ?Return in 3 months for recheck on your weight. ? ?Moisturize your skin at least 2x/day. ?See a dermatologist yearly. ? ? ?

## 2021-09-09 ENCOUNTER — Ambulatory Visit (INDEPENDENT_AMBULATORY_CARE_PROVIDER_SITE_OTHER): Payer: Medicare Other | Admitting: Family Medicine

## 2021-09-09 ENCOUNTER — Encounter: Payer: Self-pay | Admitting: Family Medicine

## 2021-09-09 VITALS — BP 120/70 | HR 76 | Ht 70.0 in | Wt 139.8 lb

## 2021-09-09 DIAGNOSIS — Z5181 Encounter for therapeutic drug level monitoring: Secondary | ICD-10-CM

## 2021-09-09 DIAGNOSIS — Z8546 Personal history of malignant neoplasm of prostate: Secondary | ICD-10-CM

## 2021-09-09 DIAGNOSIS — R351 Nocturia: Secondary | ICD-10-CM

## 2021-09-09 DIAGNOSIS — R7303 Prediabetes: Secondary | ICD-10-CM

## 2021-09-09 DIAGNOSIS — R809 Proteinuria, unspecified: Secondary | ICD-10-CM | POA: Diagnosis not present

## 2021-09-09 DIAGNOSIS — I10 Essential (primary) hypertension: Secondary | ICD-10-CM | POA: Diagnosis not present

## 2021-09-09 DIAGNOSIS — M25512 Pain in left shoulder: Secondary | ICD-10-CM

## 2021-09-09 DIAGNOSIS — Z7901 Long term (current) use of anticoagulants: Secondary | ICD-10-CM | POA: Diagnosis not present

## 2021-09-09 DIAGNOSIS — Z0000 Encounter for general adult medical examination without abnormal findings: Secondary | ICD-10-CM

## 2021-09-09 DIAGNOSIS — G8929 Other chronic pain: Secondary | ICD-10-CM

## 2021-09-09 DIAGNOSIS — E538 Deficiency of other specified B group vitamins: Secondary | ICD-10-CM

## 2021-09-09 DIAGNOSIS — G40909 Epilepsy, unspecified, not intractable, without status epilepticus: Secondary | ICD-10-CM

## 2021-09-09 DIAGNOSIS — R42 Dizziness and giddiness: Secondary | ICD-10-CM

## 2021-09-09 DIAGNOSIS — I272 Pulmonary hypertension, unspecified: Secondary | ICD-10-CM | POA: Diagnosis not present

## 2021-09-09 DIAGNOSIS — I480 Paroxysmal atrial fibrillation: Secondary | ICD-10-CM | POA: Diagnosis not present

## 2021-09-09 DIAGNOSIS — G4733 Obstructive sleep apnea (adult) (pediatric): Secondary | ICD-10-CM

## 2021-09-09 DIAGNOSIS — I350 Nonrheumatic aortic (valve) stenosis: Secondary | ICD-10-CM

## 2021-09-09 DIAGNOSIS — R634 Abnormal weight loss: Secondary | ICD-10-CM | POA: Diagnosis not present

## 2021-09-09 DIAGNOSIS — E441 Mild protein-calorie malnutrition: Secondary | ICD-10-CM

## 2021-09-09 DIAGNOSIS — I6522 Occlusion and stenosis of left carotid artery: Secondary | ICD-10-CM | POA: Diagnosis not present

## 2021-09-09 DIAGNOSIS — I700 Atherosclerosis of aorta: Secondary | ICD-10-CM | POA: Diagnosis not present

## 2021-09-09 DIAGNOSIS — E7800 Pure hypercholesterolemia, unspecified: Secondary | ICD-10-CM

## 2021-09-09 DIAGNOSIS — D519 Vitamin B12 deficiency anemia, unspecified: Secondary | ICD-10-CM | POA: Diagnosis not present

## 2021-09-09 DIAGNOSIS — R54 Age-related physical debility: Secondary | ICD-10-CM

## 2021-09-09 LAB — POCT GLYCOSYLATED HEMOGLOBIN (HGB A1C): Hemoglobin A1C: 5.6 % (ref 4.0–5.6)

## 2021-09-10 LAB — BASIC METABOLIC PANEL
BUN/Creatinine Ratio: 22 (ref 10–24)
BUN: 17 mg/dL (ref 8–27)
CO2: 24 mmol/L (ref 20–29)
Calcium: 9.5 mg/dL (ref 8.6–10.2)
Chloride: 104 mmol/L (ref 96–106)
Creatinine, Ser: 0.76 mg/dL (ref 0.76–1.27)
Glucose: 118 mg/dL — ABNORMAL HIGH (ref 70–99)
Potassium: 4.9 mmol/L (ref 3.5–5.2)
Sodium: 144 mmol/L (ref 134–144)
eGFR: 88 mL/min/{1.73_m2} (ref >59)

## 2021-09-10 LAB — CBC WITH DIFFERENTIAL/PLATELET
Basophils Absolute: 0 10*3/uL (ref 0.0–0.2)
Basos: 1 %
EOS (ABSOLUTE): 0.1 10*3/uL (ref 0.0–0.4)
Eos: 1 %
Hematocrit: 36.3 % — ABNORMAL LOW (ref 37.5–51.0)
Hemoglobin: 12.3 g/dL — ABNORMAL LOW (ref 13.0–17.7)
Immature Grans (Abs): 0 10*3/uL (ref 0.0–0.1)
Immature Granulocytes: 0 %
Lymphocytes Absolute: 2 10*3/uL (ref 0.7–3.1)
Lymphs: 27 %
MCH: 33.1 pg — ABNORMAL HIGH (ref 26.6–33.0)
MCHC: 33.9 g/dL (ref 31.5–35.7)
MCV: 98 fL — ABNORMAL HIGH (ref 79–97)
Monocytes Absolute: 1 10*3/uL — ABNORMAL HIGH (ref 0.1–0.9)
Monocytes: 13 %
Neutrophils Absolute: 4.3 10*3/uL (ref 1.4–7.0)
Neutrophils: 58 %
Platelets: 296 10*3/uL (ref 150–450)
RBC: 3.72 x10E6/uL — ABNORMAL LOW (ref 4.14–5.80)
RDW: 11.8 % (ref 11.6–15.4)
WBC: 7.4 10*3/uL (ref 3.4–10.8)

## 2021-09-10 LAB — VITAMIN B12: Vitamin B-12: 1357 pg/mL — ABNORMAL HIGH (ref 232–1245)

## 2021-09-11 ENCOUNTER — Other Ambulatory Visit: Payer: Self-pay | Admitting: Adult Health

## 2021-09-12 DIAGNOSIS — Z20822 Contact with and (suspected) exposure to covid-19: Secondary | ICD-10-CM | POA: Diagnosis not present

## 2021-09-25 DIAGNOSIS — M7582 Other shoulder lesions, left shoulder: Secondary | ICD-10-CM | POA: Diagnosis not present

## 2021-09-25 DIAGNOSIS — M75101 Unspecified rotator cuff tear or rupture of right shoulder, not specified as traumatic: Secondary | ICD-10-CM | POA: Diagnosis not present

## 2021-09-25 DIAGNOSIS — M7522 Bicipital tendinitis, left shoulder: Secondary | ICD-10-CM | POA: Diagnosis not present

## 2021-09-25 DIAGNOSIS — Z20822 Contact with and (suspected) exposure to covid-19: Secondary | ICD-10-CM | POA: Diagnosis not present

## 2021-09-25 DIAGNOSIS — M12811 Other specific arthropathies, not elsewhere classified, right shoulder: Secondary | ICD-10-CM | POA: Diagnosis not present

## 2021-09-25 DIAGNOSIS — M19011 Primary osteoarthritis, right shoulder: Secondary | ICD-10-CM | POA: Diagnosis not present

## 2021-09-30 DIAGNOSIS — Z20822 Contact with and (suspected) exposure to covid-19: Secondary | ICD-10-CM | POA: Diagnosis not present

## 2021-10-01 DIAGNOSIS — Z20822 Contact with and (suspected) exposure to covid-19: Secondary | ICD-10-CM | POA: Diagnosis not present

## 2021-10-01 DIAGNOSIS — H353231 Exudative age-related macular degeneration, bilateral, with active choroidal neovascularization: Secondary | ICD-10-CM | POA: Diagnosis not present

## 2021-10-02 LAB — SPECIMEN STATUS REPORT

## 2021-10-02 LAB — TSH: TSH: 1.88 u[IU]/mL (ref 0.450–4.500)

## 2021-10-05 DIAGNOSIS — Z20822 Contact with and (suspected) exposure to covid-19: Secondary | ICD-10-CM | POA: Diagnosis not present

## 2021-10-12 NOTE — Progress Notes (Signed)
? ? ?10/13/2021 ?10:45 AM  ? ?Jeffrey [name] ?08-21-1932 ?[id number] ? ?Referring provider: Rita Ohara, MD ?[address] ?[place],  [place] [address] ? ?Chief Complaint  ?Patient presents with  ? Follow-up  ? Prostate Cancer  ? ?Urological history ?1. Prostate cancer ?-PSA <0.1, 07/2021 ?-Completed IMRT + low-dose brachytherapy for Gleason 3+4 prostate cancer March 2011 ? ?2.  Urethral stricture ?-s/p internal urethrotomy and circumcision in September 2018 ? ?3. LU TS ?-contributing factors of age, prostate cancer, pelvic radiation, OSA, HTN, CHF, stroke, diuretics and diabetes ?-I PSS 17/3 ?-PVR 0 mL ?-Managed with Myrbetriq 50 mg daily ? ?HPI: ?Jeffrey [name] is a 88 y.o. male who presents today for a three month follow up after a trial of Myrbetriq 50 mg daily. ? ?He is taking the Myrbetriq and he feels it may not be making much difference.  He is not interested in a CPAP machine.  Patient denies any modifying or aggravating factors.  Patient denies any gross hematuria, dysuria or suprapubic/flank pain.  Patient denies any fevers, chills, nausea or vomiting.   ? ? IPSS   ? ? [place] Name 10/13/21 1000  ?  ?  ?  ? International Prostate Symptom Score  ? How often have you had the sensation of not emptying your bladder? Less than half the time    ? How often have you had to urinate less than every two hours? Less than half the time    ? How often have you found you stopped and started again several times when you urinated? Not at All    ? How often have you found it difficult to postpone urination? More than half the time    ? How often have you had a weak urinary stream? More than half the time    ? How often have you had to strain to start urination? Not at All    ? How many times did you typically get up at night to urinate? 5 Times    ? Total IPSS Score 17    ?  ? Quality of Life due to urinary symptoms  ? If you were to spend the rest of your life with your urinary condition just the way it is now how would you  feel about that? Mixed    ? ?  ?  ? ?  ? ? ? ?Score:  ?1-7 Mild ?8-19 Moderate ?20-35 Severe    ? ?PMH: ?Past Medical History:  ?Diagnosis Date  ? Acute CVA (cerebrovascular accident) ([place])   ? Acute encephalopathy 11/27/2014  ? Aortic atherosclerosis ([place]) 11/25/2014  ? Atrial fibrillation ([place])   ? a. 12/2018 [place]: Sinus rhythm, avg rate 71. 2 runs of VT (max 17 beats). 27 runs SVT (max 14 beats). Occas PACs/PVCs. No high grade AV block/pauses.  ? Chronic combined systolic (congestive) and diastolic (congestive) heart failure (HCC)   ? a. 10/2014 Echo: EF 40-45%; b. 03/2018 Echo: EF 55-60%; c. 12/2018 Echo: EF 55-60%, mild conc LVH. Nl RV fxn. RVSP 51.69mHg. Mildly dil LA. Mod AS w/ severe Ca2+; d. 12/2019 Echo: EF 50-55%, mild LVH, gr2 DD. Sev elev PASP. Mild MR, mild to mod TR. Triv AI, mod to sev AS (area 0.77cm^2, mean grad 257mg. Vmax 2.9748m.  ? Diabetes mellitus   ? Hypercholesteremia   ? Hypertension   ? Impaired fasting glucose   ? Macular degeneration   ? Moderate to severe aortic stenosis   ? a. 12/2018 Echo: EF 55-60%, Mod AS  w/ sev AoV Ca2+; b. 12/2019 Echo: EF 50-55%. Mod to sev AS (area 0.77cm^2, mean grad 61mHg. Vmax 2.956m).  ? Multiple lacunar infarcts ([place]  ? Obstructive sleep apnea 06/27/2018  ? Prostate cancer (HCCambriaradiation + seeding implant (Dr.[name])  ? Radiation proctitis 11/2012  ? treated with APC ablation and Canasa suppositories (Dr. PyHilarie Fredrickson ? [facility]) 10/2014  ? (hosp with FUO, encephalopathy)  ? Rotator cuff tear, right 10/2010  ? supraspinatous and infraspinatous  ? Seizure disorder ([place]2/24/2021  ? TIA (transient ischemic attack) 10/02/13  [facility]  ? Type 2 diabetes mellitus with microalbuminuria or microproteinuria 01/15/2014  ? ? ?Surgical History: ?Past Surgical History:  ?Procedure Laterality Date  ? CATARACT EXTRACTION Right 01/2017  ? CIRCUMCISION  01/2017  ? Dr. DaRosana Hoes? COLONOSCOPY N/A 11/28/2012  ? Procedure: COLONOSCOPY;  Surgeon: JaJerene BearsMD;  [facility];  Service: Gastroenterology;  Laterality: N/A;  ? INTERNAL URETHROTOMY  01/27/2016  ? WF  ? prostate seed implant    ? ROTATOR CUFF REPAIR  06/16/2011  ? right (Dr. GrBerenice Primas ? TONSILLECTOMY AND ADENOIDECTOMY  age 77 1? ? ?Home Medications:  ?Allergies as of 10/13/2021   ?No Known Allergies ?  ? ?  ?Medication List  ?  ? ?  ? Accurate as of Oct 13, 2021 10:45 AM. If you have any questions, ask your nurse or doctor.  ?  ?  ? ?  ? ?acetaminophen 500 MG tablet ?Commonly known as: TYLENOL ?Take 500 mg by mouth 2 (two) times daily. ?  ?apixaban 5 MG Tabs tablet ?Commonly known as: Eliquis ?Take 1 tablet (5 mg total) by mouth 2 (two) times daily. ?  ?atorvastatin 20 MG tablet ?Commonly known as: LIPITOR ?Take 1 tablet (20 mg total) by mouth daily. ?  ?carvedilol 6.25 MG tablet ?Commonly known as: COREG ?TAKE ONE TABLET TWICE DAILY WITH A MEAL ?  ?furosemide 20 MG tablet ?Commonly known as: LASIX ?Take 1 tablet (20 mg total) by mouth daily. Take 1 tablet for 4 days ?  ?Levetiracetam 750 MG Tb24 ?TAKE ONE TABLET BY MOUTH AT BEDTIME ?  ?loratadine 10 MG tablet ?Commonly known as: CLARITIN ?Take 10 mg by mouth daily as needed. ?  ?mirabegron ER 50 MG Tb24 tablet ?Commonly known as: MYRBETRIQ ?Take 1 tablet (50 mg total) by mouth daily. ?  ?PreserVision AREDS Caps ?Take 1 capsule by mouth 2 (two) times daily. ?  ?UNABLE TO FIND ?Bevacizumab(AVASTIN) chemo injection for the eye every 10 weeks ?  ?VITAMIN B 12 PO ?Take 1,000 mcg by mouth. ?  ? ?  ? ? ?Allergies: No Known Allergies ? ?Family History: ?Family History  ?Problem Relation Age of Onset  ? Heart disease Father 6524?     Died suddenly.  No diagnosis  ? Stroke Sister   ? Dementia Mother   ? Diabetes Mother   ? Heart disease Mother   ? Hypertension Mother   ? Cancer Daughter   ?     ovarian (?) vs other male cancer; s/p hyst doing well  ? Diabetes Daughter   ?     GDM, and now AODM  ? Heart disease Son   ?     congestive heart  failure--improved  ? Congestive Heart Failure Grandchild   ? ? ?Social History:  reports that he quit smoking about 41 years ago. His smoking use included cigarettes. He  has a 20.00 pack-year smoking history. He has never used smokeless tobacco. He reports current alcohol use of about 7.0 standard drinks per week. He reports that he does not use drugs. ? ?ROS: ?Pertinent ROS in HPI ? ?Physical Exam: ?BP (!) 179/99   Pulse 72   Ht '5\' 11"'$  (1.803 m)   Wt 138 lb (62.6 kg)   BMI 19.25 kg/m?   ?Constitutional:  Well nourished. Alert and oriented, No acute distress. ?HEENT: [place] AT, moist mucus membranes.  Trachea midline ?Cardiovascular: No clubbing, cyanosis, or edema. ?Neurologic: Grossly intact, no focal deficits, moving all 4 extremities. ?Psychiatric: Normal mood and affect.  ? ?Laboratory Data: ? ?  Latest Ref Rng & Units 09/09/2021  ?  2:44 PM 02/18/2021  ?  3:38 PM 11/12/2020  ? 11:39 AM  ?CBC  ?WBC 3.4 - 10.8 x10E3/uL 7.4   7.9   6.4    ?Hemoglobin 13.0 - 17.7 g/dL 12.3   13.7   11.6    ?Hematocrit 37.5 - 51.0 % 36.3   41.2   36.0    ?Platelets 150 - 450 x10E3/uL 296   280   231    ?  ? ?  Latest Ref Rng & Units 09/09/2021  ?  2:44 PM 04/13/2021  ? 12:04 PM 04/08/2021  ? 11:03 AM  ?BMP  ?Glucose 70 - 99 mg/dL 118   87   77    ?BUN 8 - 27 mg/dL '17   21   27    '$ ?Creatinine 0.76 - 1.27 mg/dL 0.76   1.02   1.10    ?BUN/Creat Ratio 10 - '24 22   21   25    '$ ?Sodium 134 - 144 mmol/L 144   140   141    ?Potassium 3.5 - 5.2 mmol/L 4.9   4.8   4.3    ?Chloride 96 - 106 mmol/L 104   102   101    ?CO2 20 - 29 mmol/L '24   24   23    '$ ?Calcium 8.6 - 10.2 mg/dL 9.5   9.1   9.2    ?  ?Component ?    Latest Ref Rng 09/09/2021  ?Hemoglobin A1C ?    4.0 - 5.6 % 5.6   ? ? ?Component ?    Latest Ref Rng 08/17/2021  ?HDL Cholesterol ?    >39 mg/dL 60   ?Triglycerides ?    0 - 149 mg/dL 95   ?Total CHOL/HDL Ratio ?    0.0 - 5.0 ratio 2.3   ?Cholesterol, Total ?    100 - 199 mg/dL 137   ?VLDL Cholesterol Cal ?    5 - 40 mg/dL 18   ?LDL Chol  Calc (NIH) ?    0 - 99 mg/dL 59   ? ?Component ?    Latest Ref Rng 08/17/2021  ?Total Bilirubin ?    0.0 - 1.2 mg/dL 0.4   ?Alkaline Phosphatase ?    44 - 121 IU/L 46   ?AST ?    0 - 40 IU/L 15   ?ALT ?    0 - 44 IU/L

## 2021-10-13 ENCOUNTER — Encounter: Payer: Self-pay | Admitting: Urology

## 2021-10-13 ENCOUNTER — Ambulatory Visit (INDEPENDENT_AMBULATORY_CARE_PROVIDER_SITE_OTHER): Payer: Medicare Other | Admitting: Urology

## 2021-10-13 VITALS — BP 179/99 | HR 72 | Ht 71.0 in | Wt 138.0 lb

## 2021-10-13 DIAGNOSIS — C61 Malignant neoplasm of prostate: Secondary | ICD-10-CM

## 2021-10-13 DIAGNOSIS — N35919 Unspecified urethral stricture, male, unspecified site: Secondary | ICD-10-CM

## 2021-10-13 DIAGNOSIS — R351 Nocturia: Secondary | ICD-10-CM

## 2021-10-13 DIAGNOSIS — R399 Unspecified symptoms and signs involving the genitourinary system: Secondary | ICD-10-CM

## 2021-10-13 DIAGNOSIS — I6522 Occlusion and stenosis of left carotid artery: Secondary | ICD-10-CM | POA: Diagnosis not present

## 2021-10-13 DIAGNOSIS — Z8546 Personal history of malignant neoplasm of prostate: Secondary | ICD-10-CM

## 2021-10-13 LAB — BLADDER SCAN AMB NON-IMAGING

## 2021-10-16 ENCOUNTER — Other Ambulatory Visit: Payer: Self-pay | Admitting: Family Medicine

## 2021-10-16 DIAGNOSIS — I10 Essential (primary) hypertension: Secondary | ICD-10-CM

## 2021-10-16 DIAGNOSIS — I5043 Acute on chronic combined systolic (congestive) and diastolic (congestive) heart failure: Secondary | ICD-10-CM

## 2021-10-16 DIAGNOSIS — E7800 Pure hypercholesterolemia, unspecified: Secondary | ICD-10-CM

## 2021-10-16 DIAGNOSIS — I480 Paroxysmal atrial fibrillation: Secondary | ICD-10-CM

## 2021-11-04 DIAGNOSIS — M12811 Other specific arthropathies, not elsewhere classified, right shoulder: Secondary | ICD-10-CM | POA: Diagnosis not present

## 2021-11-04 DIAGNOSIS — M19012 Primary osteoarthritis, left shoulder: Secondary | ICD-10-CM | POA: Diagnosis not present

## 2021-11-04 DIAGNOSIS — M75101 Unspecified rotator cuff tear or rupture of right shoulder, not specified as traumatic: Secondary | ICD-10-CM | POA: Diagnosis not present

## 2021-11-04 DIAGNOSIS — G8929 Other chronic pain: Secondary | ICD-10-CM | POA: Diagnosis not present

## 2021-11-11 ENCOUNTER — Telehealth: Payer: Self-pay | Admitting: Pharmacist

## 2021-11-11 DIAGNOSIS — G8929 Other chronic pain: Secondary | ICD-10-CM | POA: Diagnosis not present

## 2021-11-11 DIAGNOSIS — M19011 Primary osteoarthritis, right shoulder: Secondary | ICD-10-CM | POA: Diagnosis not present

## 2021-11-11 DIAGNOSIS — M19012 Primary osteoarthritis, left shoulder: Secondary | ICD-10-CM | POA: Diagnosis not present

## 2021-11-11 DIAGNOSIS — M12811 Other specific arthropathies, not elsewhere classified, right shoulder: Secondary | ICD-10-CM | POA: Diagnosis not present

## 2021-11-11 DIAGNOSIS — M75101 Unspecified rotator cuff tear or rupture of right shoulder, not specified as traumatic: Secondary | ICD-10-CM | POA: Diagnosis not present

## 2021-11-11 NOTE — Chronic Care Management (AMB) (Cosign Needed)
    Chronic Care Management Pharmacy Assistant   Name: URIAN MARTENSON  MRN: [id number] DOB: 04/10/1933  Received notice that patient cancelled follow up with Jeni Salles for 11/12/21. Call to patient to offer to reschedule appointment, patient reports he has a lot of things going on at the moment and has an upcoming appointment with his PCP in July does not wish to reschedule for a later date at this time.    Medications: Outpatient Encounter Medications as of 11/11/2021  Medication Sig   acetaminophen (TYLENOL) 500 MG tablet Take 500 mg by mouth 2 (two) times daily.   apixaban (ELIQUIS) 5 MG TABS tablet Take 1 tablet (5 mg total) by mouth 2 (two) times daily.   atorvastatin (LIPITOR) 20 MG tablet Take 1 tablet (20 mg total) by mouth daily.   carvedilol (COREG) 6.25 MG tablet TAKE ONE TABLET TWICE DAILY WITH A MEAL   Cyanocobalamin (VITAMIN B 12 PO) Take 1,000 mcg by mouth.   furosemide (LASIX) 20 MG tablet Take 1 tablet (20 mg total) by mouth daily. Take 1 tablet for 4 days   Levetiracetam 750 MG TB24 TAKE ONE TABLET BY MOUTH AT BEDTIME   loratadine (CLARITIN) 10 MG tablet Take 10 mg by mouth daily as needed.   mirabegron ER (MYRBETRIQ) 50 MG TB24 tablet Take 1 tablet (50 mg total) by mouth daily.   Multiple Vitamins-Minerals (PRESERVISION AREDS) CAPS Take 1 capsule by mouth 2 (two) times daily.    UNABLE TO FIND Bevacizumab(AVASTIN) chemo injection for the eye every 10 weeks   No facility-administered encounter medications on file as of 11/11/2021.    Care Gaps: Zoster Vaccine - Overdue Eye Exam - Overdue Urine Micro - Overdue CCM- declined reschedule AWV- 4/23 BP- 09/09/21 120/70 Lab Results  Component Value Date   HGBA1C 5.6 09/09/2021    Star Rating Drugs: Atorvastatin 20 mg - Last filled 06/26/21 90 DS at [place] to reach a person at pharm to verify    [place] Pharmacist Assistant [phone]

## 2021-11-12 ENCOUNTER — Telehealth: Payer: Medicare Other

## 2021-11-19 DIAGNOSIS — M19012 Primary osteoarthritis, left shoulder: Secondary | ICD-10-CM | POA: Diagnosis not present

## 2021-11-19 DIAGNOSIS — M19011 Primary osteoarthritis, right shoulder: Secondary | ICD-10-CM | POA: Diagnosis not present

## 2021-12-03 DIAGNOSIS — H3589 Other specified retinal disorders: Secondary | ICD-10-CM | POA: Diagnosis not present

## 2021-12-03 DIAGNOSIS — H353231 Exudative age-related macular degeneration, bilateral, with active choroidal neovascularization: Secondary | ICD-10-CM | POA: Diagnosis not present

## 2021-12-03 DIAGNOSIS — H35361 Drusen (degenerative) of macula, right eye: Secondary | ICD-10-CM | POA: Diagnosis not present

## 2021-12-15 ENCOUNTER — Ambulatory Visit (INDEPENDENT_AMBULATORY_CARE_PROVIDER_SITE_OTHER): Payer: Medicare Other

## 2021-12-15 DIAGNOSIS — I350 Nonrheumatic aortic (valve) stenosis: Secondary | ICD-10-CM | POA: Diagnosis not present

## 2021-12-15 LAB — ECHOCARDIOGRAM COMPLETE
AR max vel: 1.34 cm2
AV Area VTI: 1.42 cm2
AV Area mean vel: 1.09 cm2
AV Mean grad: 23 mmHg
AV Peak grad: 34.3 mmHg
Ao pk vel: 2.93 m/s
Area-P 1/2: 3.68 cm2
Calc EF: 52.7 %
S' Lateral: 3.55 cm
Single Plane A2C EF: 53.5 %
Single Plane A4C EF: 52.5 %

## 2021-12-15 NOTE — Progress Notes (Signed)
Chief Complaint  Patient presents with   Follow-up    3 month follow up. Patient went to the bathroom in the waiting room when he got here, said he could try again before he leaves. No new concerns.     Patient presents for 3 month follow-up.  Last seen for AWV in April 2023. At that time he reported feeling more fatigued, falls asleep easily during the day. TSH, CBC okay (Hgb 12.3), normal B12, electrolytes. Has known OSA, untreated (see below). He was also noted to have lost 13# in the last year.  He related this to not eating out as much.  Also had cut back on alcohol to 1/day. Declined referral to nutritionist (via [facility]). Encouraged protein shake/Ensure daily. Given 3 month trial to work on increasing caloric intake.  He is accompanied by his daughter today.  She states he and her mom are eating less and are both losing weight.  They report that he never started taking any protein shakes as discussed at last visit.  He is complaining of ongoing shoulder pain.  He had pain L>R, worse since his fall in 07/2021. Had series of 3 bilateral glenohumeral Orthovisc injections by ortho, without any benefit on the left, did help the pain on the right shoulder some.  Since he didn't want surgical treatment, rather than MRI, ortho referred him to pain management (all through [facility]). He hasn't heard anything yet.  Atrial fibrillation, HFpEF/pulmonary hypertension, hypertension and aortic stenosis: He denies tachycardia, palpitations. He has had labile BP's, with significant white coat component, though this has improved. He is on blood thinners and denies bleeding, bruising.   Has has issues with orthostasis --dizzy when he stands after prolonged sitting.  He can go slower and be okay.  At his last visit he reported this was worse, as he had decreased his water intake (instead of 2 large glasses of water daily, had only been having 1 glass of water, sweet tea when out, and 1  beer/day.  Still "staring out" per family, still some dizziness with standing. Current daily fluid intake includes: 1 beer, 1 glass or two of water, sweet tea if out, coffee (2 cups) in the morning. OJ with pills (small glass) Some decaff tea at home (not a lot). He reports that his appetite is good  most of the time.  He denies headaches, syncope, chest pain, palpitations, edema.   H/o Stroke (10/2014) and seizures--he continues on Keppra per neurology (for possible complex partial seizures, transient episodes of altered awareness with speech difficulties).  He remains on Eliquis for anticoagulation related to afib for prevention of further strokes.  He denies any bleeding, bruising.  He has had B12 deficiency, and is taking a supplement. Lab Results  Component Value Date   WBC 7.4 09/09/2021   HGB 12.3 (L) 09/09/2021   HCT 36.3 (L) 09/09/2021   MCV 98 (H) 09/09/2021   PLT 296 09/09/2021   Lab Results  Component Value Date   VITAMINB12 1,357 (H) 09/09/2021     IFG--He doesn't check his sugars.  Last A1c was 5.6%, max of 6% in 01/2020. He goes to the eye doctor regularly (Avastin injections for macular degeneration ([name])).  Some neuropathy in feet. He is under the care of a podiatrist.  Lab Results  Component Value Date   HGBA1C 5.6 09/09/2021   Microalbuminuria noted in 04/2017, in 05/2018 was normal.  Urine microalbumin/Cr ratio was up to 31 in 07/2019, when no longer  taking lisinopril. Last check was normal, ratio of 15, in 08/2020. Unable to give specimen at [facility], will check today.    Prostate cancer:  He is now under the care of [facility]. They prescribed Myrbetriq '50mg'$ , which improved his nocturia, down to 3-4x/night from 5-6 times/night.  They explained that untreated OSA is likely contributing to his nocturia, and encouraged him to re-try CPAP (and offered pulm referral). He declines treatment, stating that the treatment is worse than the cure.  Couldn't tolerate  2 different masks. Goes to bed 8pm, wakes up at 2-3. Rare naps   Today he states he "lost privilege to drive" due to his vision. He actually made the decision to give up driving on his own--didn't feel he could drive safely. He finds this somewhat depressing, a big adjustment, having to depend on others. Other than this change, denies depression.    PMH, PSH, SH reviewed  Outpatient Encounter Medications as of 12/16/2021  Medication Sig Note   acetaminophen (TYLENOL) 500 MG tablet Take 500 mg by mouth 2 (two) times daily. 12/16/2021: Takes 2 tablets BID   apixaban (ELIQUIS) 5 MG TABS tablet Take 1 tablet (5 mg total) by mouth 2 (two) times daily.    atorvastatin (LIPITOR) 20 MG tablet Take 1 tablet (20 mg total) by mouth daily.    carvedilol (COREG) 6.25 MG tablet TAKE ONE TABLET TWICE DAILY WITH A MEAL    Cyanocobalamin (VITAMIN B 12 PO) Take 1,000 mcg by mouth.    furosemide (LASIX) 20 MG tablet Take 1 tablet (20 mg total) by mouth daily. Take 1 tablet for 4 days    Levetiracetam 750 MG TB24 TAKE ONE TABLET BY MOUTH AT BEDTIME    mirabegron ER (MYRBETRIQ) 50 MG TB24 tablet Take 1 tablet (50 mg total) by mouth daily.    Multiple Vitamins-Minerals (PRESERVISION AREDS) CAPS Take 1 capsule by mouth 2 (two) times daily.     UNABLE TO FIND Bevacizumab(AVASTIN) chemo injection for the eye every 10 weeks    loratadine (CLARITIN) 10 MG tablet Take 10 mg by mouth daily as needed. (Patient not taking: Reported on 12/16/2021) 12/16/2021: rarely   No facility-administered encounter medications on file as of 12/16/2021.   No Known Allergies  ROS: no fever or chills, URI symptoms.  Denies cough, shortness of breath, headaches.   Dizziness with standing, intermittently per HPI. Denies bleeding, bruising. Occasional diarrhea or constipation.  No nausea or vomiting. Appetite is good Feeling a little down since recent decision to give up driving. Some intermittent dysphagia (had an episode with shrimp  at restaurant recently).   PHYSICAL EXAM:  BP (!) 170/88   Pulse 72   Ht '5\' 11"'$  (1.803 m)   Wt 135 lb 3.2 oz (61.3 kg)   BMI 18.86 kg/m   Wt Readings from Last 3 Encounters:  12/16/21 135 lb 3.2 oz (61.3 kg)  10/13/21 138 lb (62.6 kg)  09/09/21 139 lb 12.8 oz (63.4 kg)   Frail appearing elderly male, in no distress.   HEENT: conjunctiva and sclera are clear, EOMI Neck: no lymphadenopathy or thyromegaly Heart: regular rate and rhythm. 3/6 harsh systolic murmur noted throughout, loudest at apex and RUSB. Lungs: clear bilaterally Back: no spinal or CVA tenderness Abdomen: Soft, nontender, no mass Extremities: no edema, normal pulses Neuro: alert and oriented.  Resting tremor noted.  Normal, slow gait Psych: normal mood, affect, hygiene and grooming.  Echo yest--mod AS noted  ASSESSMENT/PLAN:  Mild protein-calorie malnutrition ([place]) - has had  further wt loss. Discussed with pt/dtr that I'd get [facility] involved. Encouraged increased intake (3 meals, 2 snacks, Ensure shakes 1-2/d) - Plan: AMB Referral to [facility]  Frailty - Plan: AMB Referral to [place]  Weight loss - If weight not stabilizing with improved caloric intake, discussed poss of an underlying illness/cancer, and poss need for further eval. f/u 6 wks on wt - Plan: AMB Referral to [place]  Microalbuminuria - noted previously, due for recheck  Essential hypertension, benign - white coat component, lower at home. Cont current meds per cardiology  Paroxysmal atrial fibrillation ([place]) - in NSR, anticoagulated  Acute on chronic combined systolic and diastolic CHF (congestive heart failure) (HCC) - no e/o fluid overload. Cont current meds per cardiologist  Seizure disorder ([place]) - cont meds per neuro   New adjustment to not driving. Consider counseling if struggling with moods. Has very supportive family.  [facility] to get involved to help regarding nutrition (I don't  think Medicare will otherwise pay for nutrition consult). Unsure if may need counseling if moods worsen Family will help work on the food and calories.  Urine microalb/Cr ratio due--would like to also see SG on urine to see how concentrate his urine is. Counseled at length regarding fluid intake, hydrating vs dehydrating fluids, in relation to his orthostatic symptoms. Unsure if urine was left--looks like it was not sent, will need to do at his 6 week f/u.   I spent 55 minutes dedicated to the care of this patient, including pre-visit review of records, face to face time, post-visit ordering of testing and documentation.

## 2021-12-16 ENCOUNTER — Encounter: Payer: Self-pay | Admitting: Family Medicine

## 2021-12-16 ENCOUNTER — Ambulatory Visit (INDEPENDENT_AMBULATORY_CARE_PROVIDER_SITE_OTHER): Payer: Medicare Other | Admitting: Family Medicine

## 2021-12-16 VITALS — BP 170/88 | HR 72 | Ht 71.0 in | Wt 135.2 lb

## 2021-12-16 DIAGNOSIS — I5043 Acute on chronic combined systolic (congestive) and diastolic (congestive) heart failure: Secondary | ICD-10-CM

## 2021-12-16 DIAGNOSIS — E441 Mild protein-calorie malnutrition: Secondary | ICD-10-CM | POA: Diagnosis not present

## 2021-12-16 DIAGNOSIS — I10 Essential (primary) hypertension: Secondary | ICD-10-CM | POA: Diagnosis not present

## 2021-12-16 DIAGNOSIS — I480 Paroxysmal atrial fibrillation: Secondary | ICD-10-CM

## 2021-12-16 DIAGNOSIS — G40909 Epilepsy, unspecified, not intractable, without status epilepticus: Secondary | ICD-10-CM | POA: Diagnosis not present

## 2021-12-16 DIAGNOSIS — R809 Proteinuria, unspecified: Secondary | ICD-10-CM

## 2021-12-16 DIAGNOSIS — R54 Age-related physical debility: Secondary | ICD-10-CM | POA: Diagnosis not present

## 2021-12-16 DIAGNOSIS — R634 Abnormal weight loss: Secondary | ICD-10-CM | POA: Diagnosis not present

## 2021-12-16 NOTE — Patient Instructions (Signed)
We discussed the need to increase your fluid intake (NOT caffeinated beverages).  We discussed needing to increase your food intake--3 meals plus 2 snacks daily. Have at least one of those snacks be a protein shake such as Ensure.  Have 1-2 of these daily, if you are able to.  Return in 6 weeks.  We will have [facility] contacting you to see if they can provide any resources and education regarding the nutrition information.

## 2021-12-17 ENCOUNTER — Telehealth: Payer: Self-pay | Admitting: *Deleted

## 2021-12-17 NOTE — Telephone Encounter (Signed)
-----   Message from Rise Mu, PA-C sent at 12/17/2021  7:21 AM EDT ----- Echo showed a low normal pump function, normal wall motion, moderate thickening of the heart, mild stiffening of the heart, moderately elevated pressure within the right side of the heart, dilated upper left chamber of the heart, mildly leaky mitral valve, calcified aortic valve with moderate narrowing, and normal pressure within the upper right side of the heart.  When compared to prior echo, pulm function remains largely stable, pressure within the right side of the heart is improved, mitral valve is less leaky, and the narrowed aortic valve is stable.  No changes in therapy.  Follow-up as previously planned.

## 2021-12-17 NOTE — Telephone Encounter (Signed)
No answer/No voicemail on both home and mobile number.

## 2021-12-21 ENCOUNTER — Telehealth: Payer: Self-pay | Admitting: *Deleted

## 2021-12-21 NOTE — Chronic Care Management (AMB) (Unsigned)
  Chronic Care Management   Outreach Note  12/21/2021 Name: Jeffrey [name] MRN: [id number] DOB: 11/22/1932  Jeffrey [name] is a 88 y.o. year old male who is a primary care patient of Rita Ohara, MD. I reached out to Jeffrey [name] by phone today in response to a referral sent by [name]'s primary care provider.  A telephone outreach was attempted today. The patient was referred to the case management team for assistance with care management and care coordination.   Follow Up Plan: The care management team will reach out to the patient again over the next 7 days.  If patient returns call to provider office, please advise to call [place]* at [phone].*  [place]  Direct Dial: [phone]

## 2021-12-24 NOTE — Chronic Care Management (AMB) (Signed)
  Chronic Care Management   Note  12/24/2021 Name: Jeffrey [name] MRN: [id number] DOB: 31-May-1933  Jeffrey [name] is a 88 y.o. year old male who is a primary care patient of Rita Ohara, MD. I reached out to [name] by phone today in response to a referral sent by Mr. Kosta Schnitzler Docter's PCP.  Mr. Stearns was given information about Chronic Care Management services today including:  CCM service includes personalized support from designated clinical staff supervised by his physician, including individualized plan of care and coordination with other care providers 24/7 contact phone numbers for assistance for urgent and routine care needs. Service will only be billed when office clinical staff spend 20 minutes or more in a month to coordinate care. Only one practitioner may furnish and bill the service in a calendar month. The patient may stop CCM services at any time (effective at the end of the month) by phone call to the office staff. The patient is responsible for co-pay (up to 20% after annual deductible is met) if co-pay is required by the individual health plan.   Patient did not agree to enrollment in care management services and does not wish to consider at this time.  Follow up plan: The care management team is available to follow up with the patient after provider conversation with the patient regarding recommendation for care management engagement and subsequent re-referral to the care management team. The patient has been provided with contact information for the care management team and has been advised to call with any health related questions or concerns.   [place]  Direct Dial: [phone]

## 2021-12-24 NOTE — Telephone Encounter (Signed)
Called number and recording states it can not be completed at this time.

## 2021-12-25 ENCOUNTER — Encounter: Payer: Self-pay | Admitting: *Deleted

## 2021-12-25 NOTE — Telephone Encounter (Signed)
No answer and no voicemail. Will mail letter to patient with results, recommendations, and instructions to call if any further questions.

## 2022-01-04 ENCOUNTER — Other Ambulatory Visit: Payer: Self-pay

## 2022-01-04 DIAGNOSIS — C61 Malignant neoplasm of prostate: Secondary | ICD-10-CM

## 2022-01-07 ENCOUNTER — Ambulatory Visit: Payer: Medicare Other | Admitting: Podiatry

## 2022-01-08 ENCOUNTER — Encounter: Payer: Self-pay | Admitting: Cardiovascular Disease

## 2022-01-08 ENCOUNTER — Ambulatory Visit (INDEPENDENT_AMBULATORY_CARE_PROVIDER_SITE_OTHER): Payer: Medicare Other | Admitting: Cardiovascular Disease

## 2022-01-08 VITALS — BP 127/62 | HR 67 | Ht 71.0 in | Wt 137.4 lb

## 2022-01-08 DIAGNOSIS — I6522 Occlusion and stenosis of left carotid artery: Secondary | ICD-10-CM

## 2022-01-08 DIAGNOSIS — I480 Paroxysmal atrial fibrillation: Secondary | ICD-10-CM

## 2022-01-08 DIAGNOSIS — E785 Hyperlipidemia, unspecified: Secondary | ICD-10-CM

## 2022-01-08 DIAGNOSIS — I350 Nonrheumatic aortic (valve) stenosis: Secondary | ICD-10-CM

## 2022-01-08 DIAGNOSIS — I272 Pulmonary hypertension, unspecified: Secondary | ICD-10-CM | POA: Diagnosis not present

## 2022-01-08 DIAGNOSIS — I10 Essential (primary) hypertension: Secondary | ICD-10-CM | POA: Diagnosis not present

## 2022-01-08 NOTE — Progress Notes (Signed)
Cardiology Office Note   Date:  01/08/2022   ID:  Damauri, Minion December 02, 1932, MRN [id number]  PCP:  Rita Ohara, MD  Cardiologist:   Kathlyn Sacramento, MD   Chief Complaint  Patient presents with   Follow up    ECHO follow up, no new cardiac concerns       History of Present Illness: Jeffrey [name] is a 88 y.o. male who presents for a follow-up visit regarding paroxysmal atrial fibrillation and aortic stenosis. Other medical problems include hyperlipidemia, TIA,  moderate left carotid stenosis, sleep apnea and borderline diabetes mellitus. He had previous stroke in June 2016 and later was found to have [facility] Fever which was treated with doxycycline.    Nuclear stress test in August of 2016 showed no evidence of ischemia with normal ejection fraction. Echocardiogram in November 2019 showed normal LV systolic function, moderate aortic stenosis with a mean gradient of 21 mmHg and valve area of 0.95.  Pulmonary pressure was moderately elevated at 56 mmHg.  He has known history of labile hypertension with previous elevated blood pressure which required switching metoprolol to carvedilol and adding amlodipine.  He has a component of whitecoat syndrome.  He had an emergency room visit in July, 2020 with a possible syncopal episode with low blood pressure.  He had facial laceration and was instructed to hold Eliquis for 2 days.  He had significant orthostatic hypotension that required decreasing some of his antihypertensive medications. 14-day ZIO patch monitor showed sinus rhythm with an average heart rate of 71 bpm.  There were 2 runs of ventricular tachycardia the longest lasted 17 beats.  In addition, 27 runs of SVT were noted the longest lasted 14 beats.  Given frequent ectopy, it was decided to discontinue lisinopril and amlodipine and uptitrate carvedilol back to 6.25 mg twice daily.    He has been doing well overall with no recent chest pain, shortness of breath or  palpitations.  He underwent an echocardiogram last month which showed low normal LV systolic function with moderate LVH, moderate pulmonary hypertension, mild mitral regurgitation and moderate aortic stenosis with mean gradient of 23 mmHg.   Past Medical History:  Diagnosis Date   Acute CVA (cerebrovascular accident) ([place])    Acute encephalopathy 11/27/2014   Aortic atherosclerosis ([place]) 11/25/2014   Atrial fibrillation ([place])    a. 12/2018 Zio: Sinus rhythm, avg rate 71. 2 runs of VT (max 17 beats). 27 runs SVT (max 14 beats). Occas PACs/PVCs. No high grade AV block/pauses.   Chronic combined systolic (congestive) and diastolic (congestive) heart failure ([place])    a. 10/2014 Echo: EF 40-45%; b. 03/2018 Echo: EF 55-60%; c. 12/2018 Echo: EF 55-60%, mild conc LVH. Nl RV fxn. RVSP 51.54mHg. Mildly dil LA. Mod AS w/ severe Ca2+; d. 12/2019 Echo: EF 50-55%, mild LVH, gr2 DD. Sev elev PASP. Mild MR, mild to mod TR. Triv AI, mod to sev AS (area 0.77cm^2, mean grad 224mg. Vmax 2.9765m.   Diabetes mellitus    Hypercholesteremia    Hypertension    Impaired fasting glucose    Macular degeneration    Moderate to severe aortic stenosis    a. 12/2018 Echo: EF 55-60%, Mod AS w/ sev AoV Ca2+; b. 12/2019 Echo: EF 50-55%. 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 Hilarie Fredrickson)   [facility] spotted fever) 10/2014   (hosp with FUO, encephalopathy)   Rotator cuff tear, right 10/2010   supraspinatous and infraspinatous   Seizure disorder ([place]) 07/25/2019   TIA (transient ischemic attack) 10/02/13   [facility]osp at [facility] x 1 night   Type 2 diabetes mellitus with microalbuminuria or microproteinuria 01/15/2014    Past Surgical History:  Procedure Laterality Date   CATARACT EXTRACTION  Right 01/2017   CIRCUMCISION  01/2017   [name]   COLONOSCOPY N/A 11/28/2012   Procedure: COLONOSCOPY;  Surgeon: Jerene Bears, MD;  Location: [facility] ENDOSCOPY;  Service: Gastroenterology;  Laterality: N/A;   INTERNAL URETHROTOMY  01/27/2016   [facility]   prostate seed implant     ROTATOR CUFF REPAIR  06/16/2011   right (Dr. Berenice Primas)   TONSILLECTOMY AND ADENOIDECTOMY  age 7     Current Outpatient Medications  Medication Sig Dispense Refill   acetaminophen (TYLENOL) 500 MG tablet Take 500 mg by mouth 2 (two) times daily.     apixaban (ELIQUIS) 5 MG TABS tablet Take 1 tablet (5 mg total) by mouth 2 (two) times daily. 180 tablet 1   atorvastatin (LIPITOR) 20 MG tablet Take 1 tablet (20 mg total) by mouth daily. 90 tablet 3   carvedilol (COREG) 6.25 MG tablet TAKE ONE TABLET TWICE DAILY WITH A MEAL 180 tablet 2   Cyanocobalamin (VITAMIN B 12 PO) Take 1,000 mcg by mouth.     furosemide (LASIX) 20 MG tablet Take 1 tablet (20 mg total) by mouth daily. Take 1 tablet for 4 days 30 tablet 0   Levetiracetam 750 MG TB24 TAKE ONE TABLET BY MOUTH AT BEDTIME 30 tablet 5   loratadine (CLARITIN) 10 MG tablet Take 10 mg by mouth daily as needed.     mirabegron ER (MYRBETRIQ) 50 MG TB24 tablet Take 1 tablet (50 mg total) by mouth daily. 90 tablet 3   Multiple Vitamins-Minerals (PRESERVISION AREDS) CAPS Take 1 capsule by mouth 2 (two) times daily.      UNABLE TO FIND Bevacizumab(AVASTIN) chemo injection for the eye every 10 weeks     No current facility-administered medications for this visit.    Allergies:   Patient has no known allergies.    Social History:  The patient  reports that he quit smoking about 41 years ago. His smoking use included cigarettes. He has a 20.00 pack-year smoking history. He has never used smokeless tobacco. He reports current alcohol use of about 7.0 standard drinks of alcohol per week. He reports that he does not use drugs.   Family History:  The patient's family history includes  Cancer in his daughter; Congestive Heart Failure in his grandchild; Dementia in his mother; Diabetes in his daughter and mother; Heart disease in his mother and son; Heart disease (age of onset: 68) in his father; Hypertension in his mother; Stroke in his sister.    ROS:  Please see the history of present illness.   Otherwise, review of systems are positive for none.   All other systems are reviewed and negative.    PHYSICAL EXAM: VS:  BP 127/62 (BP Location: Left Arm, Patient Position: Sitting, Cuff Size: Normal)   Pulse 67   Ht '5\' 11"'$  (1.803 m)   Wt 137 lb 6.4 oz (62.3 kg)   SpO2 98%   BMI 19.16 kg/m  , BMI Body mass index is 19.16 kg/m. GEN: Well nourished, well developed, in no  acute distress  HEENT: normal  Neck: no JVD or masses. Faint bilateral carotid bruits Cardiac: Regular with premature beats; no rubs, or gallops,no edema . There is a 3/6 crescendo decrescendo murmur in the aortic area which is mid peaking with slightly diminished S2 Respiratory:  clear to auscultation bilaterally, normal work of breathing GI: soft, nontender, nondistended, + BS MS: no deformity or atrophy  Skin: warm and dry, no rash Neuro:  Strength and sensation are intact Psych: euthymic mood, full affect   EKG:  EKG is ordered today. The ekg ordered today demonstrates normal sinus rhythm with right bundle branch block.   Recent Labs: 08/17/2021: ALT 7 09/09/2021: BUN 17; Creatinine, Ser 0.76; Hemoglobin 12.3; Platelets 296; Potassium 4.9; Sodium 144; TSH 1.880    Lipid Panel    Component Value Date/Time   CHOL 137 08/17/2021 1041   CHOL 184 10/01/2013 1719   TRIG 95 08/17/2021 1041   TRIG 400 (H) 10/01/2013 1719   HDL 60 08/17/2021 1041   HDL 55 10/01/2013 1719   CHOLHDL 2.3 08/17/2021 1041   CHOLHDL 2.3 05/27/2017 1007   VLDL 28 06/02/2016 0714   VLDL 80 (H) 10/01/2013 1719   LDLCALC 59 08/17/2021 1041   LDLCALC 78 05/27/2017 1007   LDLCALC 49 10/01/2013 1719      Wt Readings from  Last 3 Encounters:  01/08/22 137 lb 6.4 oz (62.3 kg)  12/16/21 135 lb 3.2 oz (61.3 kg)  10/13/21 138 lb (62.6 kg)         ASSESSMENT AND PLAN:  1.  Paroxysmal atrial fibrillation: The patient continues to be in sinus rhythm. He is tolerating anticoagulation with no reported side effects.  I reviewed his recent labs which were unremarkable.  2. Essential hypertension:   His blood pressure seems to be well-controlled on carvedilol 6.25 mg twice daily.  He tends to have intermittently elevated blood pressure but definitely has a component of whitecoat syndrome.  3.  Aortic valve stenosis: This continues to be moderate.  I had a discussion with the patient and his family about treatment options in the future.  Considering his age and comorbidities, I do not think he is a good candidate for TAVR and they agree.  4. Hyperlipidemia: He was switched from lovastatin to atorvastatin.  Most recent lipid profile showed an LDL of 59.  5. Left carotid stenosis: Most recent carotid Doppler in January 2023 showed stable 60 to 79% stenosis on the left side.  6.  Pulmonary hypertension: This improved on most recent echocardiogram was moderate.    Disposition:   FU in 6 months.  Signed,  Kathlyn Sacramento, MD  01/08/2022 9:11 AM    [place]

## 2022-01-08 NOTE — Patient Instructions (Signed)

## 2022-01-09 ENCOUNTER — Emergency Department: Payer: Medicare Other

## 2022-01-09 ENCOUNTER — Other Ambulatory Visit: Payer: Self-pay

## 2022-01-09 ENCOUNTER — Emergency Department
Admission: EM | Admit: 2022-01-09 | Discharge: 2022-01-09 | Disposition: A | Discharge status: Home / Self Care | Payer: Medicare Other | Attending: Emergency Medicine | Admitting: Emergency Medicine

## 2022-01-09 DIAGNOSIS — S81812A Laceration without foreign body, left lower leg, initial encounter: Secondary | ICD-10-CM | POA: Diagnosis not present

## 2022-01-09 DIAGNOSIS — Z8546 Personal history of malignant neoplasm of prostate: Secondary | ICD-10-CM | POA: Diagnosis not present

## 2022-01-09 DIAGNOSIS — Z7901 Long term (current) use of anticoagulants: Secondary | ICD-10-CM | POA: Insufficient documentation

## 2022-01-09 DIAGNOSIS — S8992XA Unspecified injury of left lower leg, initial encounter: Secondary | ICD-10-CM | POA: Diagnosis present

## 2022-01-09 DIAGNOSIS — W19XXXA Unspecified fall, initial encounter: Secondary | ICD-10-CM | POA: Diagnosis not present

## 2022-01-09 DIAGNOSIS — S8001XA Contusion of right knee, initial encounter: Secondary | ICD-10-CM | POA: Diagnosis not present

## 2022-01-09 DIAGNOSIS — I5042 Chronic combined systolic (congestive) and diastolic (congestive) heart failure: Secondary | ICD-10-CM | POA: Insufficient documentation

## 2022-01-09 DIAGNOSIS — M25562 Pain in left knee: Secondary | ICD-10-CM | POA: Diagnosis not present

## 2022-01-09 DIAGNOSIS — I959 Hypotension, unspecified: Secondary | ICD-10-CM | POA: Diagnosis not present

## 2022-01-09 DIAGNOSIS — Y92481 Parking lot as the place of occurrence of the external cause: Secondary | ICD-10-CM | POA: Diagnosis not present

## 2022-01-09 DIAGNOSIS — E119 Type 2 diabetes mellitus without complications: Secondary | ICD-10-CM | POA: Diagnosis not present

## 2022-01-09 DIAGNOSIS — R944 Abnormal results of kidney function studies: Secondary | ICD-10-CM | POA: Insufficient documentation

## 2022-01-09 DIAGNOSIS — R55 Syncope and collapse: Secondary | ICD-10-CM | POA: Insufficient documentation

## 2022-01-09 DIAGNOSIS — W1839XA Other fall on same level, initial encounter: Secondary | ICD-10-CM | POA: Insufficient documentation

## 2022-01-09 DIAGNOSIS — I110 Hypertensive heart disease with heart failure: Secondary | ICD-10-CM | POA: Diagnosis not present

## 2022-01-09 DIAGNOSIS — M25561 Pain in right knee: Secondary | ICD-10-CM | POA: Diagnosis not present

## 2022-01-09 LAB — CBC
HCT: 34.5 % — ABNORMAL LOW (ref 39.0–52.0)
Hemoglobin: 10.9 g/dL — ABNORMAL LOW (ref 13.0–17.0)
MCH: 31.6 pg (ref 26.0–34.0)
MCHC: 31.6 g/dL (ref 30.0–36.0)
MCV: 100 fL (ref 80.0–100.0)
Platelets: 279 10*3/uL (ref 150–400)
RBC: 3.45 MIL/uL — ABNORMAL LOW (ref 4.22–5.81)
RDW: 13.4 % (ref 11.5–15.5)
WBC: 7.8 10*3/uL (ref 4.0–10.5)
nRBC: 0 % (ref 0.0–0.2)

## 2022-01-09 LAB — URINALYSIS, ROUTINE W REFLEX MICROSCOPIC
Bacteria, UA: NONE SEEN
Bilirubin Urine: NEGATIVE
Glucose, UA: NEGATIVE mg/dL
Ketones, ur: 5 mg/dL — AB
Leukocytes,Ua: NEGATIVE
Nitrite: NEGATIVE
Protein, ur: NEGATIVE mg/dL
Specific Gravity, Urine: 1.021 (ref 1.005–1.030)
pH: 5 (ref 5.0–8.0)

## 2022-01-09 LAB — BASIC METABOLIC PANEL
Anion gap: 7 (ref 5–15)
BUN: 44 mg/dL — ABNORMAL HIGH (ref 8–23)
CO2: 26 mmol/L (ref 22–32)
Calcium: 9.1 mg/dL (ref 8.9–10.3)
Chloride: 106 mmol/L (ref 98–111)
Creatinine, Ser: 1.09 mg/dL (ref 0.61–1.24)
GFR, Estimated: >60 mL/min (ref >60)
Glucose, Bld: 140 mg/dL — ABNORMAL HIGH (ref 70–99)
Potassium: 4.9 mmol/L (ref 3.5–5.1)
Sodium: 139 mmol/L (ref 135–145)

## 2022-01-09 MED ORDER — SODIUM CHLORIDE 0.9 % IV BOLUS
1000.0000 mL | Freq: Once | INTRAVENOUS | Status: AC
  Administered 2022-01-09: 1000 mL via INTRAVENOUS

## 2022-01-09 NOTE — Discharge Instructions (Addendum)
I suspect that you passed out because of heat and mild dehydration. Please make sure you are staying hydrated and drinking enough fluids. If you develop worsening symptoms, please return to the emergency department.

## 2022-01-09 NOTE — ED Provider Notes (Signed)
[facility] Provider Note    Event Date/Time   First MD Initiated Contact with Patient 01/09/22 1212     (approximate)   History   Loss of Consciousness   HPI  Jeffrey [name] is a 88 y.o. male with past medical history of atrial fibrillation on Eliquis, hypertension hyperlipidemia, systolic heart failure, CVA aortic stenosis who presents with syncope.  Patient was with his wife while she was shopping in [place] he was in the car.  The window was down but the car was off there was no air conditioning running.  He opened the car door to get out because he was feeling hot and apparently fell.  He thinks he lost consciousness does not remember feeling lightheaded before.  Per EMS report when they were getting him into the ambulance he had another presyncopal episode as well and was trembling.  Patient denies any preceding chest pain palpitations or shortness of breath denies nausea vomiting or abdominal pain.  Denies fevers or chills.  His family and wife note that he has "spells" where he has syncopal episodes at home.  Nothing recent but has happened in the past.  He saw Dr. Cicero Duck yesterday for follow-up visit A-fib and aortic stenosis.  Per note from [name], most recent EF was normal and showed moderate AS, he is not a good surgical candidate for TAVR.    Past Medical History:  Diagnosis Date   Acute CVA (cerebrovascular accident) ([place])    Acute encephalopathy 11/27/2014   Aortic atherosclerosis ([place]) 11/25/2014   Atrial fibrillation ([place])    a. 12/2018 [place]: Sinus rhythm, avg rate 71. 2 runs of VT (max 17 beats). 27 runs SVT (max 14 beats). Occas PACs/PVCs. No high grade AV block/pauses.   Chronic combined systolic (congestive) and diastolic (congestive) heart failure ([place])    a. 10/2014 Echo: EF 40-45%; b. 03/2018 Echo: EF 55-60%; c. 12/2018 Echo: EF 55-60%, mild conc LVH. Nl RV fxn. RVSP 51.60mHg. Mildly dil LA. Mod AS w/ severe Ca2+; d. 12/2019 Echo: EF 50-55%, mild  LVH, gr2 DD. Sev elev PASP. Mild MR, mild to mod TR. Triv AI, mod to sev AS (area 0.77cm^2, mean grad 28mg. Vmax 2.9741m.   Diabetes mellitus    Hypercholesteremia    Hypertension    Impaired fasting glucose    Macular degeneration    Moderate to severe aortic stenosis    a. 12/2018 Echo: EF 55-60%, Mod AS w/ sev AoV Ca2+; b. 12/2019 Echo: EF 50-55%. Mod to sev AS (area 0.77cm^2, mean grad 6m51m Vmax 2.15m/71m  Multiple lacunar infarcts (HCC)    Obstructive sleep apnea 06/27/2018   [place] (HCC[place] (Dr.[name])   Radiation proctitis 11/2012   treated with APC ablation and Canasa suppositories ([facility] 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 01/15/2014    Patient Active Problem List   Diagnosis Date Noted   Mild protein-calorie malnutrition (HCC) [place]23/2022   Nocturia 10/01/2020   Urinary frequency 10/01/2020   History of prostate cancer 10/01/2020   History of urethral stricture 10/01/2020   Pain due to onychomycosis of toenails of both feet 07/07/2020   Hematoma 07/07/2020   Seizure disorder (  [place]) 07/25/2019   Obstructive sleep apnea 06/27/2018   Pulmonary hypertension ([place]) 06/27/2018   Long term current use of anticoagulant 05/28/2017   Type 2 diabetes mellitus with microalbuminuria, without long-term current use of insulin ([place]) 05/28/2017   [facility] spotted fever 07/30/2015   Aortic valve stenosis 04/11/2015   Debility 02/04/2015   Cardiomyopathy ([place]) 01/09/2015   Paroxysmal atrial fibrillation ([place]) 12/24/2014   Stroke with cerebral ischemia ([place]) 12/03/2014   Pre-diabetes    Acute on chronic combined systolic and diastolic CHF (congestive heart  failure) (HCC)    Acute CVA (cerebrovascular accident) ([place])    Left carotid artery stenosis 11/30/2014   Lacunar infarct, acute ([place])    Aortic atherosclerosis ([place]) 11/25/2014   DM (diabetes mellitus), type 2 with peripheral vascular complications ([place]) 36/14/4315   Awareness alteration, transient 12/26/2013   Low serum testosterone level 12/25/2013   TIA (transient ischemic attack) 10/10/2013   Radiation proctitis 11/28/2012   Colon cancer screening 11/28/2012   Murmur 02/23/2011   Type 2 diabetes mellitus with hypercholesterolemia ([place]) 02/05/2011   Essential hypertension, benign 11/26/2010   Impaired fasting glucose 11/26/2010   Pure hypercholesterolemia 11/26/2010   Prostate cancer ([place]) 11/26/2010     Physical Exam  Triage Vital Signs: ED Triage Vitals  Enc Vitals Group     BP 01/09/22 1123 116/62     Pulse Rate 01/09/22 1123 71     Resp 01/09/22 1123 18     Temp 01/09/22 1123 98.5 F (36.9 C)     Temp src --      SpO2 01/09/22 1123 96 %     Weight 01/09/22 1122 140 lb (63.5 kg)     Height 01/09/22 1122 '5\' 11"'$  (1.803 m)     Head Circumference --      Peak Flow --      Pain Score 01/09/22 1121 1     Pain Loc --      Pain Edu? --      Excl. in [place]? --     Most recent vital signs: Vitals:   01/09/22 1500 01/09/22 1530  BP: (!) 161/78 (!) 160/87  Pulse:  87  Resp: 17 17  Temp:    SpO2:  99%     General: Awake, no distress.  CV:  Good peripheral perfusion.  2+ systolic murmur no peripheral edema Resp:  Normal effort.  Abd:  No distention.  Neuro:             Awake, Alert, Oriented x 3  Other:  Skin tear over the left proximal tibia, ecchymosis and swelling on the right medial knee, patient able to straight leg raise in range bilaterally no significant tenderness   ED Results / Procedures / Treatments  Labs (all labs ordered are listed, but only abnormal results are displayed) Labs Reviewed  BASIC METABOLIC PANEL - Abnormal; Notable for the following  components:      Result Value   Glucose, Bld 140 (*)    BUN 44 (*)    All other components within normal limits  CBC - Abnormal; Notable for the following components:   RBC 3.45 (*)    Hemoglobin 10.9 (*)    HCT 34.5 (*)    All other components within normal limits  URINALYSIS, ROUTINE W REFLEX MICROSCOPIC - Abnormal; Notable for the following components:   Color, Urine YELLOW (*)    APPearance CLEAR (*)    Hgb urine dipstick SMALL (*)    Ketones, ur 5 (*)  All other components within normal limits     EKG  EKG shows right bundle branch block normal axis normal intervals no acute ischemic changes  RADIOLOGY I reviewed and interpreted the CT scan of the brain which does not show any acute intracranial process    PROCEDURES:  Critical Care performed: No  Procedures  The patient is on the cardiac monitor to evaluate for evidence of arrhythmia and/or significant heart rate changes.   MEDICATIONS ORDERED IN ED: Medications  sodium chloride 0.9 % bolus 1,000 mL (0 mLs Intravenous Stopped 01/09/22 1500)     IMPRESSION / MDM / ASSESSMENT AND PLAN / ED COURSE  I reviewed the triage vital signs and the nursing notes.                              Patient's presentation is most consistent with acute presentation with potential threat to life or bodily function.  Differential diagnosis includes, but is not limited to, orthostatic hypotension, vasovagal episode, valvular heart disease, arrhythmia, dehydration, hypovolemia, heat illness  Is an 88 year old male with moderate aortic stenosis presents after 2 syncopal episodes.  Patient was sitting in the hot car without AC on with just a window down while his wife was in the store he got overheated and then opened the door and then fell does not remember feeling lightheaded or having any prodrome.  Apparently when EMS arrived and they were transferring him to the stretcher he had another brief syncopal episode.  Currently he feels  well denies any preceding chest pain shortness of breath palpitations.  Has had mild GI illnesses has been eating less but denies abdominal pain.  His vitals are within normal limits.  Labs notable for mildly elevated creatinine at 1.09 not far from baseline but BUN 44, globin 10.9 no leukocytosis.  Patient's EKG shows right bundle branch block without ischemic changes.  Given patient is on thinners I obtained a CT of the head which is negative for acute abnormality.  He has no neck tenderness and did not actually hit his head so did not obtain a CT C-spine.  X-rays of bilateral knees are negative for acute pathology.  My suspicion is that it was heat related, patient was vasodilated from being in the hot car and then had some orthostatic syncope after standing up.  Together with his history of aortic stenosis he is at higher risk for syncope.  Given he is feeling well does not have any chest pain and has reassuring work-up here I think he is appropriate for outpatient follow-up.  I did give him a liter of fluid for hydration.  Discussed maintaining adequate hydration at home.  Discussed PCP and cardiology follow-up.  Clinical Course as of 01/09/22 1622  Sat Jan 09, 2022  1307 Appearance(!): CLEAR [KM]    Clinical Course User Index [KM] Rada Hay, MD     FINAL CLINICAL IMPRESSION(S) / ED DIAGNOSES   Final diagnoses:  Syncope and collapse     Rx / DC Orders   ED Discharge Orders     None        Note:  This document was prepared using Dragon voice recognition software and may include unintentional dictation errors.   Rada Hay, MD 01/09/22 1622

## 2022-01-09 NOTE — ED Triage Notes (Signed)
Pt in via EMS from [employer] after a fall. Pt fell unwitnessed in the parking lot, pt was found by another customer. Pt with skin tear on left elbow and left knee. Pt had a second syncopal episode as they were putting him in the car and was trembling in his hands so EMS brought him to the ER. CBG 199, HR 76, 97% RA, 113/50. #20g to left [facility]

## 2022-01-11 ENCOUNTER — Telehealth: Payer: Self-pay

## 2022-01-11 NOTE — Telephone Encounter (Signed)
Transition Care Management Unsuccessful Follow-up Telephone Call  Date of discharge and from where:  [facility] 01/09/22  Attempts:  1st Attempt  Reason for unsuccessful TCM follow-up call:  Unable to leave message

## 2022-01-12 ENCOUNTER — Other Ambulatory Visit: Payer: Medicare Other

## 2022-01-14 ENCOUNTER — Telehealth: Payer: Self-pay

## 2022-01-14 NOTE — Telephone Encounter (Signed)
Transition Care Management Unsuccessful Follow-up Telephone Call  Date of discharge and from where:  [facility] 01/09/22  Attempts:  2nd Attempt  Reason for unsuccessful TCM follow-up call:  Unable to leave message

## 2022-01-14 NOTE — Progress Notes (Deleted)
[facility] [address] [place]. [place] [address] [phone]       OFFICE FOLLOW UP NOTE  Jeffrey [name] Date of Birth:  [birth year] Medical Record Number:  [id number]   Referring MD:   Rudene Re Reason for visit: Seizures [facility] provider: [name]   No chief complaint on file.     HPI:   Initial visit 05/02/2018 PS: Jeffrey [name] is a pleasant 88 year old Caucasian male was seen today for initial office consultation visit.  He is accompanied by his wife and daughter.  History is obtained from them and review of referral notes.  I have reviewed imaging films in PACS.  He has been having multiple brief episodes of altered awareness for several years but these appear to have increased in the last 6 months and frequency as well as duration.  He had a prolonged episode recently in November 2019 when he went to the ER.  CT scan of the head was obtained which I reviewed showed no acute abnormality except changes of age-related small vessel disease.  He is recently had a few back-to-back episodes as well.  He is unable to identify specific trigger for his episodes.  He is usually staring during these episodes and does not respond to commands.  He does not lose consciousness fall or hurt himself.  His episodes were previously lasting less than a minute the dose of few recent ones have been more prolonged.  Following some of these episodes he tends to get agitated and disoriented.  The patient's daughter feels she is noticed that he does some automatic hand movements and some of these episodes.  He denies any headache or any aura prior to these episodes.  He has no remote history of head injury with loss of consciousness intracranial hemorrhage or strokes.  There is no family history of epilepsy or seizures.  There is no specific pattern for these episodes which may occur once every few months to several in a week.  He has not had an EEG or MRI scan done or a trial of seizure  medications. He had prior history of right anterior frontal lacunar infarct in 11/27/2014 he was felt to have asymptomatic 80 to 90% left ICA stenosis at that time.  Echocardiogram was normal.  He has remote history of [facility] spotted fever with encephalitis.  He was treated with a 2-week course of doxycycline.  He did not have any definite seizures at that time.  He had episode of brief staring with a blank look on his face in May 2016 and was admitted to [facility] where he had an EEG which was negative for seizures.  He was seen by Dr. Theador Hawthorne neurologist as an outpatient and had a negative work-up for his seizures as well.  He was referred to Dr. Juanda Crumble feels vascular surgeon for left carotid stenosis follow-up with ultrasound in the office showed stenosis to be much less and hence conservative follow-up was recommended.  He was seen by me in the office in March 2017 but has not followed up since then.  He was found to subsequently have a transient episode of paroxysmal A. fib and was started on Eliquis by cardiologist [name].  He also had a second EEG done in September 2016 which was also unremarkable except for mild generalized slowing.  Update 08/07/2018 PS : He returns for follow-up after last visit 3 months ago.  He is accompanied by his wife.  He states he  is doing well.  Is tolerating Keppra XR 500 mg quite well without any side effects.  He has had no spells like seizures or speech difficulties since the last visit.  He has had no stroke or TIA symptoms either.  He underwent EEG on 06/12/2018 which was read by me and was normal.  MRI scan of the brain done on 06/14/2018 which I have personally reviewed the images shows tiny right posterior temporal white matter lacunar infarct which was clinically silent as patient had no symptoms at that time.  Carotid ultrasound previously done on 12/23/2017 had shown 40 to 59% left ICA stenosis no significant stenosis on the right.   Transthoracic echo done on 04/05/2018 and showed normal ejection fraction.  Lipid profile done on 07/05/2018 was unremarkable with LDL cholesterol of 66 mg percent, HDL of 59 and total cholesterol 152 mg percent.  Hemoglobin A1c on 06/28/2018 was 6.0.  Patient has been diagnosed with sleep apnea by his pulmonologist Dr Leonidas Romberg and has started using CPAP every night recently.  He has no new complaints today.  He states his tremors are unchanged and are not functionally disabling.  He does follow-up with [name] from vascular surgery for carotid stenosis and will have follow-up carotid ultrasound done in upcoming visit in the spring  Update 02/20/2019 JM: Jeffrey [name] is a 88 year old male who is being seen today for follow-up accompanied by wife.  He has been doing well from a neurological standpoint.  He continues on Keppra XR 500 mg daily without recurrent seizure episodes or speech difficulties.  He did have a ED visit on 12/29/2018 for syncopal episode which was felt likely due to orthostatic hypotension with cardiology follow-up and medications adjusted.  Syncopal episode was not felt to be related to seizures.  Denies new or recurring stroke/TIA symptoms as well.  Upper extremity L>R tremors have been stable and do not interfere with daily functioning.  He remains on Eliquis without bleeding or bruising.  He endorses ongoing compliance with CPAP for OSA management.  Blood pressure today elevated at 189/90 but does endorse elevation at doctor's appointments.  He does monitor at home and typically 120s/70s.  Denies new or worsening stroke/TIA symptoms.  Update 09/03/2019 [name]: He returns for follow-up after last visit 6 months ago.  He states he is doing well but wife states that a few weeks ago she noticed episode of transient unresponsiveness where he was staring ahead and not immediately responsive this lasted only a few moments and then returned back to baseline but patient did not remember that.  There were  no automatic movements or lipsmacking noted.  There is no confusion or disorientation or headache.  There is no apparent trigger.  He remains on Keppra Exar 500 mg daily and is tolerating well without side effects.  He remains on Eliquis for his A. fib and has not had any stroke or TIA symptoms.  His blood pressure is significantly elevated today with the patient's wife claims that this on whitecoat hypertension as it is much better at home.  He continues to have intermittent hand tremor but this is not functionally disabling.  He has no other new complaints.  Update 03/05/2020 [name]: He returns for follow-up after last visit 6 months ago.  Is accompanied by his wife.  Wife feels that these episodes of transient dizziness and unresponsiveness and staring ahead is still occurring but she is not sure how frequently is happening.  She gets this is probably once  a month or so.  He has tolerated increasing the dose of Keppra XR 2000 mg daily without side effects.  He continues to have mild tremors in his hands and head but these are not interfering with day-to-day activities and remain unchanged.  Is tolerating Eliquis well without bruising or bleeding.  He had a repeat EEG done on 11/15/2019 which was normal.  He has no new complaints.  Update 09/03/2020 JM: Returns for 70-monthfollow-up.  Stable since prior visit without any reoccurring seizures or symptoms.  Remains on Keppra XR but per patient report, only taking 1 tablet at night. At prior visit, reported taking 2 tab for '1000mg'$  dosing but he denies ever taking 2 tablets at night.  He is tolerating current dose of Keppra without side effects.  Chronic mild upper extremity tremor with mild progression but does not interfer with daily activity. Hx of A. fib on Eliquis routinely monitored by cardiology.  No concerns at this time.  Update 03/09/2021 JM: Returns for 646-monthollow-up accompanied by his wife.  ED eval 11/12/2020 for unresponsive episode with eyes  open, staring and altered awareness after mowing his grass. Noted to have taken his initial dose of silodosin for his BPH that same day.  Event similar to his prior episodes.   Reports occasional feeling of "swimmy headedness" when stands too quickly and with increased exposure to heat/sunshine. Per wife, he had an additional event (similar to the one in June) with sensation of dizziness/lightheadedness, impairment of consciousness with unresponsive, and staring.  He does not remember these episodes occurring.  Lasting only short duration.  Denies actual loss of consciousness or passing out, falling, tongue biting, weakness, or autonomic movements. Wife and son able to assist pt back into house and after sitting and drinking water, shortly returned back to baseline.  He will occasionally feel lightheadedness not associated with staring or altered consciousness episodes.  He does admit to limited fluid intake drinking approximately 4-8 oz of water daily. Admits to 1-2 beers/day and occasionally a mixed drink (states "it doesn't make my doctor happy but it makes me happy").   Reports compliance on Keppra XR 500 mg nightly - denies any missed dosages.  He was previously advised to increase dose after similar episode occurred in 07/2019 but this never occurred. When seen on 09/03/2020, he denied any additional events in almost 1 year therefore remained on keppra XR '500mg'$  dosing.   Tremor stable noting improvement since prior visit and can wax/wane.  Does not interfere with daily activity or functioning.  Occasionally can be worse at night - unable to identify trigger.  Recently started on B12 supplement for B12 deficiency.  Reports compliance on Eliquis for secondary stroke prevention measures and history of A. fib managed by cardiology.  Routinely followed by PCP.  No further concerns at this time.    Update 07/20/2021 JM: [age]ear old male with history of complex partial seizures and tremor.  He is accompanied by  his wife. Stable from seizure standpoint, currently taking Keppra XR 750 mg daily without side effects. No additional seizure activity since dosage increase after prior visit.  Continued tremor noting good days/bad days and can be worse at night. He did have a fall on 2/10 with c/o left shoulder and arm pain - does experience increased shoulder and arm pain at night. Routinely followed by orthopedics receiving bilateral shoulder injections but has not yet been seen since fall. He was evaluated in ED post fall - left shoulder and arm xray  which was negative for fracture, CTH unremarkable, CT cervical no acute findings. Otherwise tremors relatively stable, does not interfere with daily activity or functioning. He also mentions over the past 6 months, experiencing sensation of a sock still on his right foot despite being barefoot typically more around his toes. Denies any worsening since onset and denies any painful symptoms.  Denies back pain or traumatic event preceding symptoms. Was started on B12 supplement a couple months ago for B12 deficiency. Hx of prediabetes but otherwise no history of neuropathy or family history of neuropathy.  Of note, blood pressure elevated today but routinely monitors at home and typically stable.  No further concerns at this time.   Update 01/18/2022 JM: Patient returns for 62-monthfollow-up.  Denies any recent seizure activity, remains on Keppra XR 750 mg daily, denies side effects  Tremor stable, good days and bad days, no significant progression  Of note, he did have syncopal event on 8/12 after sitting in the hot car without AC on while his wife was in store.  Started to feel overheated and opened the door to get out and then fell.  Evaluated in ED with work-up largely unremarkable.  Suspected episode heat related with possible orthostatic syncope.  He is closely followed by cardiology.       ROS:   14 system review of systems is positive for those listed in HPI  denies and all other systems negative  PMH:  Past Medical History:  Diagnosis Date   Acute CVA (cerebrovascular accident) ([facility]    Acute encephalopathy 11/27/2014   Aortic atherosclerosis ([place] 11/25/2014   Atrial fibrillation ([place]    a. 12/2018 [facility]: Sinus rhythm, avg rate 71. 2 runs of VT (max 17 beats). 27 runs SVT (max 14 beats). Occas PACs/PVCs. No high grade AV block/pauses.   Chronic combined systolic (congestive) and diastolic (congestive) heart failure ([place]    a. 10/2014 Echo: EF 40-45%; b. 03/2018 Echo: EF 55-60%; c. 12/2018 Echo: EF 55-60%, mild conc LVH. Nl RV fxn. RVSP 51.562mg. Mildly dil LA. Mod AS w/ severe Ca2+; d. 12/2019 Echo: EF 50-55%, mild LVH, gr2 DD. Sev elev PASP. Mild Jeffrey, mild to mod TR. Triv AI, mod to sev AS (area 0.77cm^2, mean grad 2363m. Vmax 2.72m67m   Diabetes mellitus    Hypercholesteremia    Hypertension    Impaired fasting glucose    Macular degeneration    Moderate to severe aortic stenosis    a. 12/2018 Echo: EF 55-60%, Mod AS w/ sev AoV Ca2+; b. 12/2019 Echo: EF 50-55%. Mod to sev AS (area 0.77cm^2, mean grad 23mm64mVmax 2.72m/s78m Multiple lacunar infarcts (HCC)    Obstructive sleep apnea 06/27/2018   Prostate cancer (HCC) [place] seeding [place] (Dr.[name])   Radiation proctitis 11/2012   treated with APC ablation and Canasa suppositories (Dr. 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 01/15/2014    Social History:  Social History   Socioeconomic History   Marital status: Married    Spouse name: RachelApolonio Schneidersber of children: 4   Years of education: Not on file   Highest education level: Not on file  Occupational History   Occupation: Retired  Tobacco Use  Smoking status: Former    Packs/day: 1.00    Years: 20.00    Total pack years: 20.00    Types: Cigarettes    Quit date: 05/31/1980    Years since quitting: 41.6   Smokeless tobacco: Never  Vaping Use   Vaping Use: Never used  Substance and Sexual Activity   Alcohol use: Yes    Alcohol/week: 7.0 standard drinks of alcohol    Types: 7 Cans of beer per week    Comment: 1 can of beer most days of the week   Drug use: No   Sexual activity: Not Currently    Partners: Female    Comment: issues with ED  Other Topics Concern   Not on file  Social History Narrative   Married. Retired Engineer, structural.  2 sons, 2 daughters (all in [place]), 15 grandchildren, 9 great grandchildren   Lives with wife, 1 dog.   Right Handed   Drinks 2 cups of caffeine daily   Social Determinants of Health   Financial Resource Strain: Low Risk  (05/07/2021)   Overall Financial Resource Strain (CARDIA)    Difficulty of Paying Living Expenses: Not very hard  Food Insecurity: Not on file  Transportation Needs: No Transportation Needs (05/07/2021)   PRAPARE - Hydrologist (Medical): No    Lack of Transportation (Non-Medical): No  Physical Activity: Not on file  Stress: Not on file  Social Connections: Not on file  Intimate Partner Violence: Not on file    Medications:   Current Outpatient Medications on File Prior to Visit  Medication Sig Dispense Refill   acetaminophen (TYLENOL) 500 MG tablet Take 500 mg by mouth 2 (two) times daily.     apixaban (ELIQUIS) 5 MG TABS tablet Take 1 tablet (5 mg total) by mouth 2 (two) times daily. 180 tablet 1   atorvastatin (LIPITOR) 20 MG tablet Take 1 tablet (20 mg total) by mouth daily. 90 tablet 3   carvedilol (COREG) 6.25 MG tablet TAKE ONE TABLET TWICE DAILY WITH A MEAL 180 tablet 2   Cyanocobalamin (VITAMIN B 12 PO) Take 1,000 mcg by mouth.     furosemide (LASIX) 20 MG tablet Take 1 tablet (20 mg total) by mouth daily. Take 1 tablet for 4 days  30 tablet 0   Levetiracetam 750 MG TB24 TAKE ONE TABLET BY MOUTH AT BEDTIME 30 tablet 5   loratadine (CLARITIN) 10 MG tablet Take 10 mg by mouth daily as needed.     mirabegron ER (MYRBETRIQ) 50 MG TB24 tablet Take 1 tablet (50 mg total) by mouth daily. 90 tablet 3   Multiple Vitamins-Minerals (PRESERVISION AREDS) CAPS Take 1 capsule by mouth 2 (two) times daily.      UNABLE TO FIND Bevacizumab(AVASTIN) chemo injection for the eye every 10 weeks     No current facility-administered medications on file prior to visit.    Allergies:  No Known Allergies  Physical Exam  There were no vitals filed for this visit.  There is no height or weight on file to calculate BMI.  General: Very Pleasant frail elderly Caucasian male seated, in no evident distress Head: head normocephalic and atraumatic.   Cardiovascular: regular rate and rhythm Musculoskeletal: limited R>L shoulder ROM (chronic) Skin:  no rash/petichiae Vascular:  Normal pulses all extremities  Neurologic Exam Mental Status: Awake and fully alert.  Fluent speech and language.  Oriented to place and time. Recent and remote memory intact. Attention span, concentration and fund of  knowledge appropriate. Mood and affect appropriate.   Cranial Nerves: Pupils equal, briskly reactive to light. Extraocular movements full without nystagmus. Visual fields full to confrontation.  HOH bilaterally. Facial sensation intact. Face, tongue, palate moves normally and symmetrically.  Motor: Normal bulk and tone. Normal strength in all tested extremity muscles. Mild action tremor of outstretched upper extremities left greater than right. Mild LUE tremor at rest. Intermittent head tremor.  Mild Cogwheel rigidity left greater than right wrist upon activation mainly.  No bradykinesia.   Sensory.: intact to touch , pinprick , position and vibratory sensation.  Coordination: Rapid alternating movements normal in all extremities. Finger-to-nose and heel-to-shin  performed accurately bilaterally. Gait and Station: Arises from chair without difficulty. Stance is normal. Gait demonstrates normal stride length and balance but slight diminished left arm swing. No festination or stooped posture.  Moderate difficulty performing heel, toe and tandem walk .   Reflexes: 1+ and symmetric. Toes downgoing.       ASSESSMENT/PLAN: 89 year long-standing recurrent transient stereotypical episodes of altered awareness with speech difficulties and starring possibly complex partial seizures.  He also has mild left greater than right upper extremity and head tremor possibly early parkinsonian tremor vs essential tremor which does not appear to be functionally disabling or progressive     Complex partial seizures -no recent seizure activity -Continue Keppra XR 750 mg nightly for seizure prophylaxis -Concern of recurrence events (10/2020 ED eval and 02/2021) more consistent with partial type seizure - similar episode 07/2019.  No additional events since keppra dosage increase  Tremor -stable with some worsening tremor at night - possibly slightly worsened in setting of LUE pain post fall -Does not interfere with daily activity or functioning -continue to monitor  Right foot numbness -present over past 6 months - not progressive -sensory exam stable today -does have pre-diabetes and B12 deficiency -can consider EMG/NCV in the future if indicated -continue to monitor - advised to call with any worsening     Follow-up in 6 months or call earlier if needed   CC:  Rita Ohara, MD    I spent 36 minutes of face-to-face and non-face-to-face time with patient and wife.  This included previsit chart review, lab review, study review, electronic health record documentation, patient and wife education and discussion regarding hx of seizures and use of AED, seizure triggers and avoidance, chronic tremor and continued monitoring, right foot numbness and possible causes and  answered all other questions to patient and wifes satisfaction   Frann Rider, AGNP-BC  [facility] [address] [place],  [address]  Phone [phone] Fax [phone] Note: This document was prepared with digital dictation and possible smart phrase technology. Any transcriptional errors that result from this process are unintentional.

## 2022-01-18 ENCOUNTER — Ambulatory Visit: Payer: Medicare Other | Admitting: Adult Health

## 2022-01-20 ENCOUNTER — Telehealth: Payer: Self-pay | Admitting: Pharmacist

## 2022-01-20 NOTE — Chronic Care Management (AMB) (Signed)
Chronic Care Management Pharmacy Assistant   Name: Jeffrey [name]  MRN: [id number] DOB: [birth year]  Reason for Encounter: ED Follow up per MP   Recent office visits:  12/16/21 Jeffrey Ohara, MD - Patient presented for Mild protein calorie malnutrition and other concerns. No medication changes.  Recent consult visits:  01/08/22 Jeffrey Hampshire, MD (Cardiology) - Patient presented for PAF and other concerns. No medication changes.  12/03/21 Jeffrey Larve, MD (Ophthalmology) - Patient presented for Avastin injection. No medication changes.  11/18/21 Jeffrey [name], Jeffrey Mince, MD (Ortho Surg) - Patient presented  for Orthovisc #3 Bilateral Shoulders. Injection Administered. No medication changes.  Hospital visits:  Medication Reconciliation was completed by comparing discharge summary, patient's EMR and Pharmacy list, and upon discussion with patient.  Patient presented to [facility] ED on 01/09/22 due to Syncope and Collapse . Patient was present for 3 hours.  New?Medications Started at [facility] Discharge:?? -started  none  Medication Changes at Hospital Discharge: -Changed  none  Medications Discontinued at Hospital Discharge: -Stopped  none  Medications that remain the same after Hospital Discharge:??  -All other medications will remain the same.    Medications: Outpatient Encounter Medications as of 01/20/2022  Medication Sig Note   acetaminophen (TYLENOL) 500 MG tablet Take 500 mg by mouth 2 (two) times daily. 12/16/2021: Takes 2 tablets BID   apixaban (ELIQUIS) 5 MG TABS tablet Take 1 tablet (5 mg total) by mouth 2 (two) times daily.    atorvastatin (LIPITOR) 20 MG tablet Take 1 tablet (20 mg total) by mouth daily.    carvedilol (COREG) 6.25 MG tablet TAKE ONE TABLET TWICE DAILY WITH A MEAL    Cyanocobalamin (VITAMIN B 12 PO) Take 1,000 mcg by mouth.    furosemide (LASIX) 20 MG tablet Take 1 tablet (20 mg total) by mouth daily. Take 1 tablet for 4 days     Levetiracetam 750 MG TB24 TAKE ONE TABLET BY MOUTH AT BEDTIME    loratadine (CLARITIN) 10 MG tablet Take 10 mg by mouth daily as needed. 12/16/2021: rarely   mirabegron ER (MYRBETRIQ) 50 MG TB24 tablet Take 1 tablet (50 mg total) by mouth daily.    Multiple Vitamins-Minerals (PRESERVISION AREDS) CAPS Take 1 capsule by mouth 2 (two) times daily.     UNABLE TO FIND Bevacizumab(AVASTIN) chemo injection for the eye every 10 weeks    No facility-administered encounter medications on file as of 01/20/2022.  Contacted [name] for General Review Call   Adherence Review:  Does the Clinical Pharmacist Assistant have access to adherence rates? Yes Adherence rates for STAR metric medications Atorvastatin 20 mg - Last filled 11/20/21 90 DS at [facility] Atorvastatin 20 mg - Last filled 06/26/21 90 DS at [facility]  Disease State Questions:  Able to connect with Patient? Yes  Have you had any admissions or emergency room visits or worsening of your condition(s) since last visit? Yes Patient reports he is a little scratched up but making out ok. He reports no questions following discharge or concerns at this time. Confirmed that he is to follow up with PCP on 9/6    Care Gaps: Zoster Vaccine - Overdue Eye Exam - Overdue COVID Booster - Overdue Urine Micro - Overdue Flu Vaccine - Overdue Foot Exam - Overdue BP- 127/62 01/08/22 AWV- 4/23 Lab Results  Component Value Date   HGBA1C 5.6 09/09/2021    Star Rating Drugs: Atorvastatin 20 mg - Last filled 11/20/21 90  DS at [facility] Clinical Pharmacist Assistant [phone]

## 2022-01-31 NOTE — Progress Notes (Signed)
Chief Complaint  Patient presents with   Follow-up    6 week follow up on weightloss. Had neuro appt earlier today. Confirms that he has not had any bleeding. He states he feels like his eyesight is worsening-he does go for shots in his eyes every 6-10 weeks.    Patient presents for 6 week follow-up on weight loss. He is accompanied by his wife today.  In the interim, he has been to the ER.  He had an ER visit for syncope 8/12. Had been sitting in hot car with no AC on while wife was shopping, got hot, and passed out when he opened the door to get some air. (He states he tripped and fell). In ER, they checked head CT (due to being anticoagulated), no acute abnormality.  X-rays of bilateral knees were negative for acute pathology. ER doc felt that it was heat related, patient was vasodilated from being in the hot car and then had some orthostatic syncope after standing up. He had a skin tear at L proximal tibia, and bruising and swelling of R medial knee. He has aortic stenosis, and had recently seen his cardiologist (the day before syncopal event). Echo was done 11/2021: EF 50-55%, no wall motion abnl, mod LVH. Grade II diastolic dysfunction. Moderately elevated pulm artery systolic pressure. Mod dilation of L atrium. Mild MR. Moderate aortic valve stenosis.  On reviewing labs from ER, noted to have drop in hemoglobin. Patient is on blood thinners.  He denies any bleeding. Component Ref Range & Units 3 wk ago (01/09/22) 4 mo ago (09/09/21) 11 mo ago (02/18/21) 1 yr ago (11/12/20) 1 yr ago (09/08/20) 2 yr ago (01/23/20) 2 yr ago (07/26/19)  WBC 4.0 - 10.5 K/uL 7.8  7.4 R  7.9 R  6.4  7.6 R  7.5 R  8.7 R   RBC 4.22 - 5.81 MIL/uL 3.45 Low   3.72 Low  R  4.22 R  3.54 Low   4.07 Low  R  4.08 Low  R  4.09 Low  R   Hemoglobin 13.0 - 17.0 g/dL 10.9 Low   12.3 Low  R  13.7 R  11.6 Low   13.2 R  13.6 R  13.0 R   HCT 39.0 - 52.0 % 34.5 Low   36.3 Low  R  41.2 R  36.0 Low   39.4 R  40.8 R  40.1 R   MCV 80.0 -  100.0 fL 100.0  98 High  R  98 High  R  101.7 High   97 R  100 High  R  98 High  R   MCH 26.0 - 34.0 pg 31.6  33.1 High  R  32.5 R  32.8  32.4 R  33.3 High  R  31.8 R   MCHC 30.0 - 36.0 g/dL 31.6  33.9 R  33.3 R  32.2  33.5 R  33.3 R  32.4 R   RDW 11.5 - 15.5 % 13.4  11.8 R  12.0 R  12.5  11.8 R  11.9 R  12.1 R   Platelets 150 - 400 K/uL 279  296 R  280 R  231  284 R  278      Weight loss-- Seen with his daughter 6 weeks ago.  At that time we discussed the importance of increasing fluid intake (non-caffeinated fluids), and caloric intake. We discussed eating 3 meals plus 2 snacks daily, with at least one of those snacks be a protein  shake such as Ensure.  Have 1-2 of these daily, if able to.  At one point was having 2-3 shakes/day, thought it might be too much so stopped it. He was having some diarrhea. He hasn't had any in the last 3-4 days, and the diarrhea resolved after a day or so. He seemed to think that the shakes may have contributed to the diarrhea, but he also reports that he had been having constipation and used Miralax.  He used Miralax x 3-4 days, until he got results (diarrhea), then stopped.   He has been eating dinner, and a light lunch (banana and cheese sandwich).  Goes out for breakfast 1-2x/week.  Otherwise he eats cereal or donuts for breakfast.   Admits he isn't good about his water intake. He has coffee in the morning, tea in the afternoon. These are both reportedly decaff. He has 1-2 beers/day.  PMH, PSH, SH reviewed  Outpatient Encounter Medications as of 02/03/2022  Medication Sig Note   acetaminophen (TYLENOL) 500 MG tablet Take 500 mg by mouth 2 (two) times daily. 12/16/2021: Takes 2 tablets BID   apixaban (ELIQUIS) 5 MG TABS tablet Take 1 tablet (5 mg total) by mouth 2 (two) times daily.    atorvastatin (LIPITOR) 20 MG tablet Take 1 tablet (20 mg total) by mouth daily.    carvedilol (COREG) 6.25 MG tablet TAKE ONE TABLET TWICE DAILY WITH A MEAL    Cyanocobalamin  (VITAMIN B 12 PO) Take 1,000 mcg by mouth.    furosemide (LASIX) 20 MG tablet Take 1 tablet (20 mg total) by mouth daily. Take 1 tablet for 4 days    levETIRAcetam (KEPPRA XR) 750 MG 24 hr tablet Take 1 tablet (750 mg total) by mouth at bedtime.    mirabegron ER (MYRBETRIQ) 50 MG TB24 tablet Take 1 tablet (50 mg total) by mouth daily.    Multiple Vitamins-Minerals (PRESERVISION AREDS) CAPS Take 1 capsule by mouth 2 (two) times daily.     UNABLE TO FIND Bevacizumab(AVASTIN) chemo injection for the eye every 10 weeks    loratadine (CLARITIN) 10 MG tablet Take 10 mg by mouth daily as needed. (Patient not taking: Reported on 02/03/2022) 12/16/2021: rarely   [DISCONTINUED] Levetiracetam 750 MG TB24 TAKE ONE TABLET BY MOUTH AT BEDTIME    No facility-administered encounter medications on file as of 02/03/2022.   No Known Allergies  ROS:  no fever, chills, URI symptoms.  Recent syncope, none since ER visit. No chest pain, shortness of breath. He denies any bleeding. He had some short-lived bleeding with the skin tear on his leg. Recent diarrhea, no black or bloody stools He also has some constipation. No longer feeling bloated or gassy.  Last BM was several days ago. Last few days he hasn't been voiding as often or as much, smaller amount. Very yellow only if drinks beer.   PHYSICAL EXAM:  BP 128/72   Pulse 72   Ht '5\' 11"'$  (1.803 m)   Wt 139 lb (63 kg)   BMI 19.39 kg/m   Wt Readings from Last 3 Encounters:  02/03/22 139 lb (63 kg)  02/03/22 139 lb (63 kg)  01/09/22 140 lb (63.5 kg)   12/16/2021 135# 3.2 oz  Frail appearing elderly male, in no distress.   He was unsteady when he first stood up from chair.  HEENT: conjunctiva and sclera are clear, EOMI Neck: no lymphadenopathy or thyromegaly Heart: regular rate and rhythm. 3/6 harsh systolic murmur noted throughout, loudest at apex and RUSB.  Lungs: clear bilaterally Back: no spinal or CVA tenderness Abdomen: Soft, nontender, no  mass Extremities: no edema, normal pulses Neuro: alert and oriented.  Resting tremor noted.  Normal, slow gait Psych: normal mood, affect, hygiene and grooming. Skin: overlying L proximal tibia there is a healing wound--23 x 12 mm eschar. No erythema, warmth, soft tissue swelling   Urine dip: SG 1.020, trace protein, otherwise normal   ASSESSMENT/PLAN:  Syncope, unspecified syncope type - None since ER visit; worsening anemia noted, recheck today. Encouraged improved hydration, less alcohol - Plan: POCT Urinalysis DIP (Proadvantage Device)  Weight loss - counseled re: diet and supplements. Suspect miralax contributed to diarrhea more than the protein supplements, but limit to 2/d  Mild protein-calorie malnutrition (HCC)  Microalbuminuria - Plan: Microalbumin / creatinine urine ratio  Paroxysmal atrial fibrillation (HCC) - on eliquis  Acute on chronic combined systolic and diastolic CHF (congestive heart failure) (HCC) - controlled  Nonrheumatic aortic valve stenosis - moderate per recent echo, not likely the reason for syncope  Pre-diabetes - reviewed proper diet. Wt loss likely helping this. To limit sweets, eat more nutritious foods - Plan: POCT Urinalysis DIP (Proadvantage Device)  Anemia, unspecified type - unclear reason for recent drop in Hg, will recheck. Pt anticoagulated. If worsening, may need to f/u with GI, no other obvious loss - Plan: CBC with Differential/Platelet, Ferritin, Iron  Long term current use of anticoagulant - Plan: CBC with Differential/Platelet, Ferritin, Iron  Diarrhea, unspecified type - pt related to protein shakes, but likely related to using MIralax to treat constipation. Reviewed proper diet  Other constipation - reviewed high fiber diet, suggested fiber supplement daily  [facility] if needs it.

## 2022-02-02 NOTE — Progress Notes (Unsigned)
[facility] [address] [place]. [place] [address] [phone]       OFFICE FOLLOW UP NOTE  Jeffrey. Jeffrey [name] Date of Birth:  [birth year] Medical Record Number:  [id number]   Referring MD:   Rudene Re Reason for visit: Seizures [facility] provider: [name]   No chief complaint on file.     HPI:   Initial visit 05/02/2018 PS: Jeffrey [name] is a pleasant 88 year old Caucasian male was seen today for initial office consultation visit.  He is accompanied by his wife and daughter.  History is obtained from them and review of referral notes.  I have reviewed imaging films in PACS.  He has been having multiple brief episodes of altered awareness for several years but these appear to have increased in the last 6 months and frequency as well as duration.  He had a prolonged episode recently in November 2019 when he went to the ER.  CT scan of the head was obtained which I reviewed showed no acute abnormality except changes of age-related small vessel disease.  He is recently had a few back-to-back episodes as well.  He is unable to identify specific trigger for his episodes.  He is usually staring during these episodes and does not respond to commands.  He does not lose consciousness fall or hurt himself.  His episodes were previously lasting less than a minute the dose of few recent ones have been more prolonged.  Following some of these episodes he tends to get agitated and disoriented.  The patient's daughter feels she is noticed that he does some automatic hand movements and some of these episodes.  He denies any headache or any aura prior to these episodes.  He has no remote history of head injury with loss of consciousness intracranial hemorrhage or strokes.  There is no family history of epilepsy or seizures.  There is no specific pattern for these episodes which may occur once every few months to several in a week.  He has not had an EEG or MRI scan done or a trial of seizure  medications. He had prior history of right anterior frontal lacunar infarct in 11/27/2014 he was felt to have asymptomatic 80 to 90% left ICA stenosis at that time.  Echocardiogram was normal.  He has remote history of [facility] spotted fever with encephalitis.  He was treated with a 2-week course of doxycycline.  He did not have any definite seizures at that time.  He had episode of brief staring with a blank look on his face in May 2016 and was admitted to [facility] where he had an EEG which was negative for seizures.  He was seen by Dr. Theador Hawthorne neurologist as an outpatient and had a negative work-up for his seizures as well.  He was referred to Dr. Juanda Crumble feels vascular surgeon for left carotid stenosis follow-up with ultrasound in the office showed stenosis to be much less and hence conservative follow-up was recommended.  He was seen by me in the office in March 2017 but has not followed up since then.  He was found to subsequently have a transient episode of paroxysmal A. fib and was started on Eliquis by cardiologist [name].  He also had a second EEG done in September 2016 which was also unremarkable except for mild generalized slowing.  Update 08/07/2018 PS : He returns for follow-up after last visit 3 months ago.  He is accompanied by his wife.  He states he  is doing well.  Is tolerating Keppra XR 500 mg quite well without any side effects.  He has had no spells like seizures or speech difficulties since the last visit.  He has had no stroke or TIA symptoms either.  He underwent EEG on 06/12/2018 which was read by me and was normal.  MRI scan of the brain done on 06/14/2018 which I have personally reviewed the images shows tiny right posterior temporal white matter lacunar infarct which was clinically silent as patient had no symptoms at that time.  Carotid ultrasound previously done on 12/23/2017 had shown 40 to 59% left ICA stenosis no significant stenosis on the right.   Transthoracic echo done on 04/05/2018 and showed normal ejection fraction.  Lipid profile done on 07/05/2018 was unremarkable with LDL cholesterol of 66 mg percent, HDL of 59 and total cholesterol 152 mg percent.  Hemoglobin A1c on 06/28/2018 was 6.0.  Patient has been diagnosed with sleep apnea by his pulmonologist Dr Leonidas Romberg and has started using CPAP every night recently.  He has no new complaints today.  He states his tremors are unchanged and are not functionally disabling.  He does follow-up with [name] from vascular surgery for carotid stenosis and will have follow-up carotid ultrasound done in upcoming visit in the spring  Update 02/20/2019 JM: Jeffrey [name] is a 88 year old male who is being seen today for follow-up accompanied by wife.  He has been doing well from a neurological standpoint.  He continues on Keppra XR 500 mg daily without recurrent seizure episodes or speech difficulties.  He did have a ED visit on 12/29/2018 for syncopal episode which was felt likely due to orthostatic hypotension with cardiology follow-up and medications adjusted.  Syncopal episode was not felt to be related to seizures.  Denies new or recurring stroke/TIA symptoms as well.  Upper extremity L>R tremors have been stable and do not interfere with daily functioning.  He remains on Eliquis without bleeding or bruising.  He endorses ongoing compliance with CPAP for OSA management.  Blood pressure today elevated at 189/90 but does endorse elevation at doctor's appointments.  He does monitor at home and typically 120s/70s.  Denies new or worsening stroke/TIA symptoms.  Update 09/03/2019 [name]: He returns for follow-up after last visit 6 months ago.  He states he is doing well but wife states that a few weeks ago she noticed episode of transient unresponsiveness where he was staring ahead and not immediately responsive this lasted only a few moments and then returned back to baseline but patient did not remember that.  There were  no automatic movements or lipsmacking noted.  There is no confusion or disorientation or headache.  There is no apparent trigger.  He remains on Keppra Exar 500 mg daily and is tolerating well without side effects.  He remains on Eliquis for his A. fib and has not had any stroke or TIA symptoms.  His blood pressure is significantly elevated today with the patient's wife claims that this on whitecoat hypertension as it is much better at home.  He continues to have intermittent hand tremor but this is not functionally disabling.  He has no other new complaints.  Update 03/05/2020 [name]: He returns for follow-up after last visit 6 months ago.  Is accompanied by his wife.  Wife feels that these episodes of transient dizziness and unresponsiveness and staring ahead is still occurring but she is not sure how frequently is happening.  She gets this is probably once  a month or so.  He has tolerated increasing the dose of Keppra XR 2000 mg daily without side effects.  He continues to have mild tremors in his hands and head but these are not interfering with day-to-day activities and remain unchanged.  Is tolerating Eliquis well without bruising or bleeding.  He had a repeat EEG done on 11/15/2019 which was normal.  He has no new complaints.  Update 09/03/2020 JM: Returns for 60-monthfollow-up.  Stable since prior visit without any reoccurring seizures or symptoms.  Remains on Keppra XR but per patient report, only taking 1 tablet at night. At prior visit, reported taking 2 tab for '1000mg'$  dosing but he denies ever taking 2 tablets at night.  He is tolerating current dose of Keppra without side effects.  Chronic mild upper extremity tremor with mild progression but does not interfer with daily activity. Hx of A. fib on Eliquis routinely monitored by cardiology.  No concerns at this time.  Update 03/09/2021 JM: Returns for 616-monthollow-up accompanied by his wife.  ED eval 11/12/2020 for unresponsive episode with eyes  open, staring and altered awareness after mowing his grass. Noted to have taken his initial dose of silodosin for his BPH that same day.  Event similar to his prior episodes.   Reports occasional feeling of "swimmy headedness" when stands too quickly and with increased exposure to heat/sunshine. Per wife, he had an additional event (similar to the one in June) with sensation of dizziness/lightheadedness, impairment of consciousness with unresponsive, and staring.  He does not remember these episodes occurring.  Lasting only short duration.  Denies actual loss of consciousness or passing out, falling, tongue biting, weakness, or autonomic movements. Wife and son able to assist pt back into house and after sitting and drinking water, shortly returned back to baseline.  He will occasionally feel lightheadedness not associated with staring or altered consciousness episodes.  He does admit to limited fluid intake drinking approximately 4-8 oz of water daily. Admits to 1-2 beers/day and occasionally a mixed drink (states "it doesn't make my doctor happy but it makes me happy").   Reports compliance on Keppra XR 500 mg nightly - denies any missed dosages.  He was previously advised to increase dose after similar episode occurred in 07/2019 but this never occurred. When seen on 09/03/2020, he denied any additional events in almost 1 year therefore remained on keppra XR '500mg'$  dosing.   Tremor stable noting improvement since prior visit and can wax/wane.  Does not interfere with daily activity or functioning.  Occasionally can be worse at night - unable to identify trigger.  Recently started on B12 supplement for B12 deficiency.  Reports compliance on Eliquis for secondary stroke prevention measures and history of A. fib managed by cardiology.  Routinely followed by PCP.  No further concerns at this time.    Update 07/20/2021 JM: [age]ear old male with history of complex partial seizures and tremor.  He is accompanied by  his wife. Stable from seizure standpoint, currently taking Keppra XR 750 mg daily without side effects. No additional seizure activity since dosage increase after prior visit.  Continued tremor noting good days/bad days and can be worse at night. He did have a fall on 2/10 with c/o left shoulder and arm pain - does experience increased shoulder and arm pain at night. Routinely followed by orthopedics receiving bilateral shoulder injections but has not yet been seen since fall. He was evaluated in ED post fall - left shoulder and arm xray  which was negative for fracture, CTH unremarkable, CT cervical no acute findings. Otherwise tremors relatively stable, does not interfere with daily activity or functioning. He also mentions over the past 6 months, experiencing sensation of a sock still on his right foot despite being barefoot typically more around his toes. Denies any worsening since onset and denies any painful symptoms.  Denies back pain or traumatic event preceding symptoms. Was started on B12 supplement a couple months ago for B12 deficiency. Hx of prediabetes but otherwise no history of neuropathy or family history of neuropathy.  Of note, blood pressure elevated today but routinely monitors at home and typically stable.  No further concerns at this time.   Update 02/03/2022 JM: Patient returns for 13-monthfollow-up.  Denies any recent seizure activity, remains on Keppra XR 750 mg daily, denies side effects  Tremor stable, good days and bad days, no significant progression  Of note, he did have syncopal event on 8/12 after sitting in the hot car without AC on while his wife was in store.  Started to feel overheated and opened the door to get out and then fell.  Evaluated in ED with work-up largely unremarkable.  Suspected episode heat related with possible orthostatic syncope.  He is closely followed by cardiology.       ROS:   14 system review of systems is positive for those listed in HPI  denies and all other systems negative  PMH:  Past Medical History:  Diagnosis Date   Acute [place] ([place]rebrovascular [place]) ([facility]    Acute encephalopathy 11/27/2014   Aortic atherosclerosis ([facility] 11/25/2014   Atrial fibrillation ([facility]    a. 12/2018 [facility]: Sinus rhythm, avg rate 71. 2 runs of VT (max 17 beats). 27 runs SVT (max 14 beats). Occas PACs/PVCs. No high grade AV block/pauses.   Chronic combined systolic (congestive) and diastolic (congestive) heart failure ([facility]    a. 10/2014 Echo: EF 40-45%; b. 03/2018 Echo: EF 55-60%; c. 12/2018 Echo: EF 55-60%, mild conc LVH. Nl RV fxn. RVSP 51.565mg. Mildly dil LA. Mod AS w/ severe Ca2+; d. 12/2019 Echo: EF 50-55%, mild LVH, gr2 DD. Sev elev PASP. Mild Jeffrey, mild to mod TR. Triv AI, mod to sev AS (area 0.77cm^2, mean grad 2395m. Vmax 2.81m77m   Diabetes mellitus    Hypercholesteremia    Hypertension    Impaired fasting glucose    Macular degeneration    Moderate to severe aortic stenosis    a. 12/2018 Echo: EF 55-60%, Mod AS w/ sev AoV Ca2+; b. 12/2019 Echo: EF 50-55%. Mod to sev AS (area 0.77cm^2, mean grad 23mm26mVmax 2.81m/s63m Multiple lacunar infarcts (HCC)    Obstructive sleep apnea 06/27/2018   Prostate cancer (HCC) rFolsomation + seeding implant (Dr.[name])   Radiation proctitis 11/2012   treated with APC ablation and Canasa suppositories (Dr. 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 01/15/2014    Social History:  Social History   Socioeconomic History   Marital status: Married    Spouse name: Jeffrey Schneidersber of children: 4   Years of education: Not on file   Highest education level: Not on file  Occupational History   Occupation: Retired  Tobacco Use  Smoking status: Former    Packs/day: 1.00    Years: 20.00    Total pack years: 20.00    Types: Cigarettes    Quit date: 05/31/1980    Years since quitting: 41.7   Smokeless tobacco: Never  Vaping Use   Vaping Use: Never used  Substance and Sexual Activity   Alcohol use: Yes    Alcohol/week: 7.0 standard drinks of alcohol    Types: 7 Cans of beer per week    Comment: 1 can of beer most days of the week   Drug use: No   Sexual activity: Not Currently    Partners: Female    Comment: issues with ED  Other Topics Concern   Not on file  Social History Narrative   Married. Retired Engineer, structural.  2 sons, 2 daughters (all in [place]), 15 grandchildren, 9 great grandchildren   Lives with wife, 1 dog.   Right Handed   Drinks 2 cups of caffeine daily   Social Determinants of Health   Financial Resource Strain: Low Risk  (05/07/2021)   Overall Financial Resource Strain (CARDIA)    Difficulty of Paying Living Expenses: Not very hard  Food Insecurity: Not on file  Transportation Needs: No Transportation Needs (05/07/2021)   PRAPARE - Hydrologist (Medical): No    Lack of Transportation (Non-Medical): No  Physical Activity: Not on file  Stress: Not on file  Social Connections: Not on file  Intimate Partner Violence: Not on file    Medications:   Current Outpatient Medications on File Prior to Visit  Medication Sig Dispense Refill   acetaminophen (TYLENOL) 500 MG tablet Take 500 mg by mouth 2 (two) times daily.     apixaban (ELIQUIS) 5 MG TABS tablet Take 1 tablet (5 mg total) by mouth 2 (two) times daily. 180 tablet 1   atorvastatin (LIPITOR) 20 MG tablet Take 1 tablet (20 mg total) by mouth daily. 90 tablet 3   carvedilol (COREG) 6.25 MG tablet TAKE ONE TABLET TWICE DAILY WITH A MEAL 180 tablet 2   Cyanocobalamin (VITAMIN B 12 PO) Take 1,000 mcg by mouth.     furosemide (LASIX) 20 MG tablet Take 1 tablet (20 mg total) by mouth daily. Take 1 tablet for 4 days  30 tablet 0   Levetiracetam 750 MG TB24 TAKE ONE TABLET BY MOUTH AT BEDTIME 30 tablet 5   loratadine (CLARITIN) 10 MG tablet Take 10 mg by mouth daily as needed.     mirabegron ER (MYRBETRIQ) 50 MG TB24 tablet Take 1 tablet (50 mg total) by mouth daily. 90 tablet 3   Multiple Vitamins-Minerals (PRESERVISION AREDS) CAPS Take 1 capsule by mouth 2 (two) times daily.      UNABLE TO FIND Bevacizumab(AVASTIN) chemo injection for the eye every 10 weeks     No current facility-administered medications on file prior to visit.    Allergies:  No Known Allergies  Physical Exam  There were no vitals filed for this visit.  There is no height or weight on file to calculate BMI.  General: Very Pleasant frail elderly Caucasian male seated, in no evident distress Head: head normocephalic and atraumatic.   Cardiovascular: regular rate and rhythm Musculoskeletal: limited R>L shoulder ROM (chronic) Skin:  no rash/petichiae Vascular:  Normal pulses all extremities  Neurologic Exam Mental Status: Awake and fully alert.  Fluent speech and language.  Oriented to place and time. Recent and remote memory intact. Attention span, concentration and fund of  knowledge appropriate. Mood and affect appropriate.   Cranial Nerves: Pupils equal, briskly reactive to light. Extraocular movements full without nystagmus. Visual fields full to confrontation.  HOH bilaterally. Facial sensation intact. Face, tongue, palate moves normally and symmetrically.  Motor: Normal bulk and tone. Normal strength in all tested extremity muscles. Mild action tremor of outstretched upper extremities left greater than right. Mild LUE tremor at rest. Intermittent head tremor.  Mild Cogwheel rigidity left greater than right wrist upon activation mainly.  No bradykinesia.   Sensory.: intact to touch , pinprick , position and vibratory sensation.  Coordination: Rapid alternating movements normal in all extremities. Finger-to-nose and heel-to-shin  performed accurately bilaterally. Gait and Station: Arises from chair without difficulty. Stance is normal. Gait demonstrates normal stride length and balance but slight diminished left arm swing. No festination or stooped posture.  Moderate difficulty performing heel, toe and tandem walk .   Reflexes: 1+ and symmetric. Toes downgoing.       ASSESSMENT/PLAN: 89 year long-standing recurrent transient stereotypical episodes of altered awareness with speech difficulties and starring possibly complex partial seizures.  He also has mild left greater than right upper extremity and head tremor possibly early parkinsonian tremor vs essential tremor which does not appear to be functionally disabling or progressive     Complex partial seizures -no recent seizure activity -Continue Keppra XR 750 mg nightly for seizure prophylaxis -Concern of recurrence events (10/2020 ED eval and 02/2021) more consistent with partial type seizure - similar episode 07/2019.  No additional events since keppra dosage increase  Tremor -stable with some worsening tremor at night - possibly slightly worsened in setting of LUE pain post fall -Does not interfere with daily activity or functioning -continue to monitor  Right foot numbness -present over past 6 months - not progressive -sensory exam stable today -does have pre-diabetes and B12 deficiency -can consider EMG/NCV in the future if indicated -continue to monitor - advised to call with any worsening     Follow-up in 6 months or call earlier if needed   CC:  Rita Ohara, MD    I spent 36 minutes of face-to-face and non-face-to-face time with patient and wife.  This included previsit chart review, lab review, study review, electronic health record documentation, patient and wife education and discussion regarding hx of seizures and use of AED, seizure triggers and avoidance, chronic tremor and continued monitoring, right foot numbness and possible causes and  answered all other questions to patient and wifes satisfaction   Frann Rider, AGNP-BC  [facility] [address] [place], [place] [address]  Phone [phone] Fax [phone] Note: This document was prepared with digital dictation and possible smart phrase technology. Any transcriptional errors that result from this process are unintentional.

## 2022-02-03 ENCOUNTER — Encounter: Payer: Self-pay | Admitting: Adult Health

## 2022-02-03 ENCOUNTER — Ambulatory Visit (INDEPENDENT_AMBULATORY_CARE_PROVIDER_SITE_OTHER): Payer: Medicare Other | Admitting: Family Medicine

## 2022-02-03 ENCOUNTER — Encounter: Payer: Self-pay | Admitting: Family Medicine

## 2022-02-03 ENCOUNTER — Encounter: Payer: Self-pay | Admitting: Internal Medicine

## 2022-02-03 ENCOUNTER — Ambulatory Visit (INDEPENDENT_AMBULATORY_CARE_PROVIDER_SITE_OTHER): Payer: Medicare Other | Admitting: Adult Health

## 2022-02-03 VITALS — BP 156/86 | HR 70 | Ht 71.0 in | Wt 139.0 lb

## 2022-02-03 VITALS — BP 128/72 | HR 72 | Ht 71.0 in | Wt 139.0 lb

## 2022-02-03 DIAGNOSIS — D649 Anemia, unspecified: Secondary | ICD-10-CM | POA: Diagnosis not present

## 2022-02-03 DIAGNOSIS — E441 Mild protein-calorie malnutrition: Secondary | ICD-10-CM | POA: Diagnosis not present

## 2022-02-03 DIAGNOSIS — I480 Paroxysmal atrial fibrillation: Secondary | ICD-10-CM

## 2022-02-03 DIAGNOSIS — I272 Pulmonary hypertension, unspecified: Secondary | ICD-10-CM

## 2022-02-03 DIAGNOSIS — I5043 Acute on chronic combined systolic (congestive) and diastolic (congestive) heart failure: Secondary | ICD-10-CM

## 2022-02-03 DIAGNOSIS — I951 Orthostatic hypotension: Secondary | ICD-10-CM

## 2022-02-03 DIAGNOSIS — R200 Anesthesia of skin: Secondary | ICD-10-CM | POA: Diagnosis not present

## 2022-02-03 DIAGNOSIS — K5909 Other constipation: Secondary | ICD-10-CM

## 2022-02-03 DIAGNOSIS — R809 Proteinuria, unspecified: Secondary | ICD-10-CM

## 2022-02-03 DIAGNOSIS — R197 Diarrhea, unspecified: Secondary | ICD-10-CM | POA: Diagnosis not present

## 2022-02-03 DIAGNOSIS — Z7901 Long term (current) use of anticoagulants: Secondary | ICD-10-CM

## 2022-02-03 DIAGNOSIS — R7303 Prediabetes: Secondary | ICD-10-CM

## 2022-02-03 DIAGNOSIS — R55 Syncope and collapse: Secondary | ICD-10-CM

## 2022-02-03 DIAGNOSIS — G4733 Obstructive sleep apnea (adult) (pediatric): Secondary | ICD-10-CM

## 2022-02-03 DIAGNOSIS — R251 Tremor, unspecified: Secondary | ICD-10-CM

## 2022-02-03 DIAGNOSIS — I350 Nonrheumatic aortic (valve) stenosis: Secondary | ICD-10-CM

## 2022-02-03 DIAGNOSIS — G40109 Localization-related (focal) (partial) symptomatic epilepsy and epileptic syndromes with simple partial seizures, not intractable, without status epilepticus: Secondary | ICD-10-CM

## 2022-02-03 DIAGNOSIS — R634 Abnormal weight loss: Secondary | ICD-10-CM | POA: Diagnosis not present

## 2022-02-03 LAB — POCT URINALYSIS DIP (PROADVANTAGE DEVICE)
Bilirubin, UA: NEGATIVE
Blood, UA: NEGATIVE
Glucose, UA: NEGATIVE mg/dL
Ketones, POC UA: NEGATIVE mg/dL
Leukocytes, UA: NEGATIVE
Nitrite, UA: NEGATIVE
Specific Gravity, Urine: 1.02
Urobilinogen, Ur: NEGATIVE
pH, UA: 6 (ref 5.0–8.0)

## 2022-02-03 MED ORDER — LEVETIRACETAM ER 750 MG PO TB24
750.0000 mg | ORAL_TABLET | Freq: Every day | ORAL | 3 refills | Status: DC
Start: 2022-02-03 — End: 2022-11-25

## 2022-02-03 NOTE — Patient Instructions (Addendum)
You still need to be drinking more non-caffeinated fluids during the day (whether that be water, milk, lemonade, juice, etc). Please cut out the afternoon tea.  Limit morning coffee to just 1 cup/day. (These apparently aren't caffeinated, though may have small amounts). Your beer intake of 1-2 per day needs to stop.  This is definitely contributing to dehydration, and constipation. PLEASE cut out the beer.  I'd like for you to re-try the Ensure at just 1-2/day. I think your diarrhea is more likely from the miralax (laxative) rather than from the shakes.  If you get recurrent diarrhea while on the shakes (and not taking miralax), then try switching to a different type of protein shake. If you do well at 1/day, but have diarrhea when you have 2/day, then try and eat an extra snack instead of having an extra protein drink, and be sure to have at least 1-2 glasses of water with that snack.  I'd like for you to start taking a fiber supplement such as Metamucil or Benefiber.  Get the powder version (not any tablets or wafers), and drink a large glass of water with the fiber.  Use an antibacterial ointment such as bacitracin or neosporin 3 times daily.  Avoid picking at the area. Eventually the scab should come off on its own. Seek re-evaluation if you notice swelling, redness, drainage or pain surrounding the wound/scab.  Please consider using a walker or cane to prevent falls.

## 2022-02-03 NOTE — Patient Instructions (Addendum)
Continue Keppra XR 750 mg daily for seizure prevention  Please monitor blood pressure daily at home, best time to check is first thing in the morning about 1 hour after taking blood pressure medications. Also try to check when feeling lightheaded or dizzy or when not responding normally. During times of not responding verbally (but did not actually lose consciousness), if blood pressure is normal we may have to consider increasing keppra dosage - please keep me updated! Please also ensure you schedule a follow up visit with cardiology to further discuss low blood pressure and syncopal episodes   Important to gradually increase water intake with goal of drinking at least 64 oz of water per day. Try to drink majority of water in the morning/early afternoon to avoid waking up frequently at night to go to the bathroom  Continue to monitor tremors at this time  Continue to monitor right foot numbness      Follow up in 6 months or call earlier if needed      Thank you for coming to see [place] at [facility]. I hope we have been able to provide you high quality care today.  You may receive a patient satisfaction survey over the next few weeks. We would appreciate your feedback and comments so that we may continue to improve ourselves and the health of our patients.

## 2022-02-04 LAB — CBC WITH DIFFERENTIAL/PLATELET
Basophils Absolute: 0.1 10*3/uL (ref 0.0–0.2)
Basos: 1 %
EOS (ABSOLUTE): 0.1 10*3/uL (ref 0.0–0.4)
Eos: 1 %
Hematocrit: 36 % — ABNORMAL LOW (ref 37.5–51.0)
Hemoglobin: 11.4 g/dL — ABNORMAL LOW (ref 13.0–17.7)
Immature Grans (Abs): 0 10*3/uL (ref 0.0–0.1)
Immature Granulocytes: 0 %
Lymphocytes Absolute: 1.9 10*3/uL (ref 0.7–3.1)
Lymphs: 21 %
MCH: 31.8 pg (ref 26.6–33.0)
MCHC: 31.7 g/dL (ref 31.5–35.7)
MCV: 101 fL — ABNORMAL HIGH (ref 79–97)
Monocytes Absolute: 1 10*3/uL — ABNORMAL HIGH (ref 0.1–0.9)
Monocytes: 11 %
Neutrophils Absolute: 6.1 10*3/uL (ref 1.4–7.0)
Neutrophils: 66 %
Platelets: 307 10*3/uL (ref 150–450)
RBC: 3.58 x10E6/uL — ABNORMAL LOW (ref 4.14–5.80)
RDW: 12 % (ref 11.6–15.4)
WBC: 9 10*3/uL (ref 3.4–10.8)

## 2022-02-04 LAB — MICROALBUMIN / CREATININE URINE RATIO
Creatinine, Urine: 192.7 mg/dL
Microalb/Creat Ratio: 19 mg/g creat (ref 0–29)
Microalbumin, Urine: 37.2 ug/mL

## 2022-02-04 LAB — IRON: Iron: 53 ug/dL (ref 38–169)

## 2022-02-04 LAB — FERRITIN: Ferritin: 56 ng/mL (ref 30–400)

## 2022-02-05 ENCOUNTER — Encounter: Payer: Self-pay | Admitting: Family Medicine

## 2022-02-18 DIAGNOSIS — H353231 Exudative age-related macular degeneration, bilateral, with active choroidal neovascularization: Secondary | ICD-10-CM | POA: Diagnosis not present

## 2022-03-01 ENCOUNTER — Emergency Department
Admission: EM | Admit: 2022-03-01 | Discharge: 2022-03-01 | Disposition: A | Discharge status: Home / Self Care | Payer: Medicare Other | Attending: Emergency Medicine | Admitting: Emergency Medicine

## 2022-03-01 ENCOUNTER — Other Ambulatory Visit: Payer: Self-pay

## 2022-03-01 ENCOUNTER — Telehealth: Payer: Self-pay | Admitting: Cardiovascular Disease

## 2022-03-01 ENCOUNTER — Emergency Department: Payer: Medicare Other

## 2022-03-01 DIAGNOSIS — E860 Dehydration: Secondary | ICD-10-CM | POA: Insufficient documentation

## 2022-03-01 DIAGNOSIS — R42 Dizziness and giddiness: Secondary | ICD-10-CM

## 2022-03-01 DIAGNOSIS — K627 Radiation proctitis: Secondary | ICD-10-CM | POA: Diagnosis not present

## 2022-03-01 LAB — BASIC METABOLIC PANEL
Anion gap: 7 (ref 5–15)
BUN: 28 mg/dL — ABNORMAL HIGH (ref 8–23)
CO2: 29 mmol/L (ref 22–32)
Calcium: 9 mg/dL (ref 8.9–10.3)
Chloride: 105 mmol/L (ref 98–111)
Creatinine, Ser: 1.3 mg/dL — ABNORMAL HIGH (ref 0.61–1.24)
GFR, Estimated: 53 mL/min — ABNORMAL LOW (ref >60)
Glucose, Bld: 112 mg/dL — ABNORMAL HIGH (ref 70–99)
Potassium: 4.2 mmol/L (ref 3.5–5.1)
Sodium: 141 mmol/L (ref 135–145)

## 2022-03-01 LAB — CBC
HCT: 36.7 % — ABNORMAL LOW (ref 39.0–52.0)
Hemoglobin: 11.4 g/dL — ABNORMAL LOW (ref 13.0–17.0)
MCH: 31.7 pg (ref 26.0–34.0)
MCHC: 31.1 g/dL (ref 30.0–36.0)
MCV: 101.9 fL — ABNORMAL HIGH (ref 80.0–100.0)
Platelets: 276 10*3/uL (ref 150–400)
RBC: 3.6 MIL/uL — ABNORMAL LOW (ref 4.22–5.81)
RDW: 12.6 % (ref 11.5–15.5)
WBC: 6.3 10*3/uL (ref 4.0–10.5)
nRBC: 0 % (ref 0.0–0.2)

## 2022-03-01 NOTE — ED Notes (Addendum)
Pt reports intermittent "dizzy spells" when getting up from a chair suddenly. Pt states that he is not currently dizzy.

## 2022-03-01 NOTE — Telephone Encounter (Signed)
Pt c/o Syncope: STAT if syncope occurred within 30 minutes and pt complains of lightheadedness High Priority if episode of passing out, completely, today or in last 24 hours   Did you pass out today?   Yes   When is the last time you passed out? 2-3 hours ago     Has this occurred multiple times?  Yes   Did you have any symptoms prior to passing out? No    Wife called stating patient has been passing out.  Wife stated patient passed out 4 times today.

## 2022-03-01 NOTE — Telephone Encounter (Signed)
Spoke with patients wife and she reports her husband has passed out 4 times today and does not recognize her at times. Given these symptoms recommended that they proceed to local ED for further evaluation. She verbalized understanding with no further questions at this time.   Spoke with ED charge nurse Heather RN to let her know patient is on the way and his symptoms.

## 2022-03-01 NOTE — ED Provider Triage Note (Signed)
Emergency Medicine Provider Triage Evaluation Note  Jeffrey [name] , a 88 y.o. male  was evaluated in triage.  Pt complains of dizziness, worse when going from sitting to standing.  Denies chest pain or shortness of breath.  No vomiting or diarrhea..  Review of Systems  Positive: Dizziness Negative: Chest pain  Physical Exam  BP (!) 163/81   Pulse 79   Temp 97.8 F (36.6 C) (Oral)   Resp 20   Ht '5\' 11"'$  (1.803 m)   Wt 63.5 kg   SpO2 99%   BMI 19.53 kg/m  Gen:   Awake, no distress   Resp:  Normal effort  MSK:   Moves extremities without difficulty  Other:    Medical Decision Making  Medically screening exam initiated at 4:23 PM.  Appropriate orders placed.  Jeffrey [name] was informed that the remainder of the evaluation will be completed by another provider, this initial triage assessment does not replace that evaluation, and the importance of remaining in the ED until their evaluation is complete.  Order CT of the head and basic labs   Jeffrey Starks, PA-C 03/01/22 1624

## 2022-03-01 NOTE — ED Provider Notes (Addendum)
[facility] Provider Note   Event Date/Time   First MD Initiated Contact with Patient 03/01/22 1809     (approximate) History  Dizziness  HPI Jeffrey [name] is a 88 y.o. male with stated past medical history of poor p.o. intake who presents for orthostatic lightheadedness with multiple episodes today of feeling as if he was going to pass out after not having any fluids throughout the day and being outside.  Patient states that this is happened multiple times in the past when he has not drink enough fluids and feels that this is similar.  Patient denies any complaints at this time ROS: Patient currently denies any vision changes, tinnitus, difficulty speaking, facial droop, sore throat, chest pain, shortness of breath, abdominal pain, nausea/vomiting/diarrhea, dysuria, or weakness/numbness/paresthesias in any extremity   Physical Exam  Triage Vital Signs: ED Triage Vitals  Enc Vitals Group     BP 03/01/22 1615 (!) 163/81     Pulse Rate 03/01/22 1615 79     Resp 03/01/22 1615 20     Temp 03/01/22 1615 97.8 F (36.6 C)     Temp Source 03/01/22 1615 Oral     SpO2 03/01/22 1615 99 %     Weight 03/01/22 1616 140 lb (63.5 kg)     Height 03/01/22 1616 '5\' 11"'$  (1.803 m)     Head Circumference --      Peak Flow --      Pain Score 03/01/22 1616 0     Pain Loc --      Pain Edu? --      Excl. in [place]? --    Most recent vital signs: Vitals:   03/01/22 1615 03/01/22 1926  BP: (!) 163/81 (!) 189/100  Pulse: 79 83  Resp: 20 18  Temp: 97.8 F (36.6 C) 97.6 F (36.4 C)  SpO2: 99% 98%   General: Awake, oriented x4. CV:  Good peripheral perfusion.  Resp:  Normal effort.  Abd:  No distention.  Other:  Elderly Caucasian male laying in bed in no acute distress ED Results / Procedures / Treatments  Labs (all labs ordered are listed, but only abnormal results are displayed) Labs Reviewed  BASIC METABOLIC PANEL - Abnormal; Notable for the following components:       Result Value   Glucose, Bld 112 (*)    BUN 28 (*)    Creatinine, Ser 1.30 (*)    GFR, Estimated 53 (*)    All other components within normal limits  CBC - Abnormal; Notable for the following components:   RBC 3.60 (*)    Hemoglobin 11.4 (*)    HCT 36.7 (*)    MCV 101.9 (*)    All other components within normal limits  URINALYSIS, ROUTINE W REFLEX MICROSCOPIC  CBG MONITORING, ED   EKG ED ECG REPORT I, Naaman Plummer, the attending physician, personally viewed and interpreted this ECG. Date: 03/01/2022 EKG Time: 1622 Rate: 80 Rhythm: normal sinus rhythm QRS Axis: normal Intervals: Right bundle branch block ST/T Wave abnormalities: normal Narrative Interpretation: Normal sinus rhythm with right bundle branch block.  No evidence of acute ischemia RADIOLOGY ED MD interpretation: CT of the head without contrast interpreted by me shows no evidence of acute abnormalities including no intracerebral hemorrhage, obvious masses, or significant edema -Agree with radiology assessment Official radiology report(s): CT HEAD WO CONTRAST (5MM)  Result Date: 03/01/2022 CLINICAL DATA:  Dizziness EXAM: CT HEAD WITHOUT CONTRAST TECHNIQUE: Contiguous axial images were obtained from  the base of the skull through the vertex without intravenous contrast. RADIATION DOSE REDUCTION: This exam was performed according to the departmental dose-optimization program which includes automated exposure control, adjustment of the mA and/or kV according to patient size and/or use of iterative reconstruction technique. COMPARISON:  01/09/2022 FINDINGS: Brain: No acute intracranial findings are seen. There are no signs of bleeding within the cranium. Cortical sulci are prominent. There is decreased density in periventricular and subcortical white matter. Vascular: Scattered arterial calcifications are seen. Skull: No fracture is seen. Sinuses/Orbits: There is mucosal thickening in left ethmoid sinus. Other: None. IMPRESSION:  No acute intracranial findings are seen in noncontrast CT brain. Atrophy. Small-vessel disease. Chronic ethmoid sinusitis. Electronically Signed   By: Elmer Picker M.D.   On: 03/01/2022 17:41   PROCEDURES: Critical Care performed: No .1-3 Lead EKG Interpretation  Performed by: Naaman Plummer, MD Authorized by: Naaman Plummer, MD     Interpretation: abnormal     ECG rate:  84   ECG rate assessment: normal     Rhythm: sinus rhythm     Ectopy: none     Conduction: normal   Comments:     RBBB  MEDICATIONS ORDERED IN ED: Medications - No data to display IMPRESSION / MDM / [place] / ED COURSE  I reviewed the triage vital signs and the nursing notes.                             The patient is on the cardiac monitor to evaluate for evidence of arrhythmia and/or significant heart rate changes. Patient's presentation is most consistent with acute presentation with potential threat to life or bodily function. This patient presents with generalized weakness and fatigue likely secondary to dehydration. Suspect acute kidney injury of prerenal origin. Doubt intrinsic renal dysfunction or obstructive nephropathy. Considered alternate etiologies of the patients symptoms including infectious processes, severe metabolic derangements or electrolyte abnormalities, ischemia/ACS, heart failure, and intracranial/central processes but think these are unlikely given the history and physical exam.  Plan: labs, oral fluid resuscitation, pain/nausea control, reassessment Dispo: Discharge home with instructions for rehydration   FINAL CLINICAL IMPRESSION(S) / ED DIAGNOSES   Final diagnoses:  Dehydration  Orthostatic lightheadedness   Rx / DC Orders   ED Discharge Orders     None      Note:  This document was prepared using Dragon voice recognition software and may include unintentional dictation errors.   Naaman Plummer, MD 03/01/22 Kennedy Bucker    Naaman Plummer, MD 03/01/22  [phone]

## 2022-03-01 NOTE — ED Triage Notes (Signed)
Pt arrives with c/o dizziness episodes today. Per pt, when he goes from sitting to standing he gets dizzy and his BP drops. Pt a&ox4. Pt denies LOC.

## 2022-03-01 NOTE — ED Notes (Signed)
Pt A&O, pt given discharge instructions, pt ambulating with steady gait. 

## 2022-03-03 ENCOUNTER — Telehealth: Payer: Self-pay

## 2022-03-03 NOTE — Telephone Encounter (Signed)
Transition Care Management Follow-up Telephone Call Date of discharge and from where: [facility] 03/01/22 How have you been since you were released from the hospital? Doing better Any questions or concerns? No  Items Reviewed: Did the pt receive and understand the discharge instructions provided? Yes  Medications obtained and verified? Yes  Other? No  Any new allergies since your discharge? No  Dietary orders reviewed? Yes Do you have support at home? Yes   Home Care and Equipment/Supplies: Were home health services ordered? no  Follow up appointments reviewed:  PCP Hospital f/u appt confirmed? No  pt. Did not think f/u was needed.   Are transportation arrangements needed? No  If their condition worsens, is the pt aware to call PCP or go to the Emergency Dept.? Yes Was the patient provided with contact information for the PCP's office or ED? Yes Was to pt encouraged to call back with questions or concerns? Yes

## 2022-03-09 ENCOUNTER — Encounter: Payer: Self-pay | Admitting: Internal Medicine

## 2022-03-22 ENCOUNTER — Telehealth (INDEPENDENT_AMBULATORY_CARE_PROVIDER_SITE_OTHER): Payer: Medicare Other | Admitting: Family Medicine

## 2022-03-22 ENCOUNTER — Encounter: Payer: Self-pay | Admitting: Family Medicine

## 2022-03-22 VITALS — BP 102/57 | Temp 95.5°F | Ht 71.0 in

## 2022-03-22 DIAGNOSIS — E441 Mild protein-calorie malnutrition: Secondary | ICD-10-CM

## 2022-03-22 DIAGNOSIS — R350 Frequency of micturition: Secondary | ICD-10-CM

## 2022-03-22 DIAGNOSIS — R319 Hematuria, unspecified: Secondary | ICD-10-CM | POA: Diagnosis not present

## 2022-03-22 DIAGNOSIS — N390 Urinary tract infection, site not specified: Secondary | ICD-10-CM | POA: Diagnosis not present

## 2022-03-22 DIAGNOSIS — I6522 Occlusion and stenosis of left carotid artery: Secondary | ICD-10-CM | POA: Diagnosis not present

## 2022-03-22 LAB — POCT URINALYSIS DIP (PROADVANTAGE DEVICE)
Bilirubin, UA: NEGATIVE
Glucose, UA: NEGATIVE mg/dL
Ketones, POC UA: NEGATIVE mg/dL
Nitrite, UA: NEGATIVE
Protein Ur, POC: 30 mg/dL — AB
Specific Gravity, Urine: 1.01
Urobilinogen, Ur: 0.2
pH, UA: 6 (ref 5.0–8.0)

## 2022-03-22 MED ORDER — CIPROFLOXACIN HCL 500 MG PO TABS: 500.0000 mg | ORAL_TABLET | Freq: Two times a day (BID) | ORAL | 0 refills | Status: AC

## 2022-03-22 NOTE — Progress Notes (Signed)
Start time: 1:30 End time: 2:02  Virtual Visit via Video Note  I connected with Jeffrey [name] on 03/22/22 by a video enabled telemedicine application and verified that I am speaking with the correct person using two identifiers.  Location: Patient: At home, with daughter Jeffrey [name], and wife Jeffrey [name] Provider: office   I discussed the limitations of evaluation and management by telemedicine and the availability of in person appointments. The patient expressed understanding and agreed to proceed.  History of Present Illness:  Chief Complaint  Patient presents with   Urinary Tract Infection    VIRTUAL UTI, did at home test positive. Has been sick off and on at home-stomach upset and diarrhea-did clear up. Legs hurt and having trouble walking. Having frequency a lot at night. Loss of appetite and sleeping a lot. No pain or burning with urination. Generally has urgency, but no more than usual. Did have some fevers when this all started-symptoms started last week, not exactly sure when.    Immunizations    Son in law Pharm @ [facility] and can bring HD Flu/Covid booster, wanted to check with you and make sure he is okay to get these.   About a week ago he developed soreness and achiness in his legs that comes and goes.  He has been very weak for several days, too weak to leave the house.  Sometimes he feels dizzy.  Arms and legs feel weak.  Not having dizzy"spells", per daughter this is different, looking "very sick". More of a generalized weakness. First night or two he was confused, per daughter. At that time he had LG fever, around 100 (about 5-6 days ago, lasted about 2 days).  Aches started with the fever, but persists. He takes tylenol, but hasn't had any further fever (doesn't recur when med wears off).. Using a walker, sleeping more.   Home UTI test--Leuks and nitrites both +. Daughter thinks there may be some blood in the urine.   Drinking 3-4 cups water/d, 2 glasses of tea (caffeine, recently  switching to decaf). No beer.  Last had diarrhea a couple of days ago, improved.  Comes and goes.  Not eating much, but he reports appetite is improving a little.  Sees urologist, meds haven't helped. Taking myrbetriq. Last seen in May. Always has urinary frequency. Last urine done here (f/u visit on weight loss and ER visit for syncope). 9/6 urine SG 1.020, trace protein. He was again at the ER 10/2 for dizziness, declined a f/u visit here after. Daughter states neuro felt it was related to BP or heart, cardiologist didn't say much.  No changes to meds.  He has been drinking more water, as reported above (and no beer).  BP Readings from Last 3 Encounters:  03/22/22 (!) 102/57  03/01/22 (!) 189/100  02/03/22 128/72    PMH, PSH, SH reviewed   Outpatient Encounter Medications as of 03/22/2022  Medication Sig Note   acetaminophen (TYLENOL) 500 MG tablet Take 500 mg by mouth 2 (two) times daily. 12/16/2021: Takes 2 tablets BID   apixaban (ELIQUIS) 5 MG TABS tablet Take 1 tablet (5 mg total) by mouth 2 (two) times daily.    atorvastatin (LIPITOR) 20 MG tablet Take 1 tablet (20 mg total) by mouth daily.    carvedilol (COREG) 6.25 MG tablet TAKE ONE TABLET TWICE DAILY WITH A MEAL    Cyanocobalamin (VITAMIN B 12 PO) Take 1,000 mcg by mouth.    levETIRAcetam (KEPPRA XR) 750 MG 24 hr tablet Take 1  tablet (750 mg total) by mouth at bedtime.    mirabegron ER (MYRBETRIQ) 50 MG TB24 tablet Take 1 tablet (50 mg total) by mouth daily.    Multiple Vitamins-Minerals (PRESERVISION AREDS) CAPS Take 1 capsule by mouth 2 (two) times daily.     UNABLE TO FIND Bevacizumab(AVASTIN) chemo injection for the eye every 10 weeks    furosemide (LASIX) 20 MG tablet Take 1 tablet (20 mg total) by mouth daily. Take 1 tablet for 4 days (Patient not taking: Reported on 03/22/2022)    loratadine (CLARITIN) 10 MG tablet Take 10 mg by mouth daily as needed. (Patient not taking: Reported on 02/03/2022) 12/16/2021: rarely   No  facility-administered encounter medications on file as of 03/22/2022.   No Known Allergies  ROS:  fever initially, resolved.  No headaches, chest pain, edema, palpitations. No nausea, vomiting.  Decreased appetite, but improving over the last few days. No diarrhea x 2 days. No dysuria or abdominal pain, no flank pain.  +aches in the legs. See HPI   Observations/Objective:  BP (!) 102/57 Comment: repeat 116 63  Temp (!) 95.5 F (35.3 C) (Oral)   Ht '5\' 11"'$  (1.803 m)   BMI 19.53 kg/m   Elderly, thin male, seated at home. He is alert and oriented, and answers questions appropriately. Daughter assists with some of the history. Unshaven. Cranial nerves are grossly intact, normal speech. Exam is limited due to the virtual nature of the visit.  Daughter dropped off urine specimen-- SG 1.010, large blood, +protein, 3+ leuks, negative nitrite.   Assessment and Plan:   Urinary tract infection with hematuria, site unspecified - Cover with cipro, culture sent. Pt very weak--if not improving or worse, to go to ER for further eval (disc potential urosepsis, need for IV ABX/fluids) - Plan: ciprofloxacin (CIPRO) 500 MG tablet  Urinary frequency - Plan: POCT Urinalysis DIP (Proadvantage Device), Urine Culture  Mild protein-calorie malnutrition ([place]) - pt not eating well related to this illness, and overall generally weak.  Drinking more and less orthostatic hx, but I'm very concerned today; refused ER  Refused ER.  Reviewed s/sx with pt and daughter for which they should bring him to ER. Started presumptively on Cipro, culture sent. Encouraged to stay well hydrated and eat.  Discussed vaccines--when feeing a little stronger, discussed the recommendations for flu shot, COVID booster, RSV vaccine.  Not to do currently, given that he is so weak and could have side effects.    Follow Up Instructions:    I discussed the assessment and treatment plan with the patient. The patient was provided  an opportunity to ask questions and all were answered. The patient agreed with the plan and demonstrated an understanding of the instructions.   The patient was advised to call back or seek an in-person evaluation if the symptoms worsen or if the condition fails to improve as anticipated.  I spent 38 minutes dedicated to the care of this patient, including pre-visit review of records, face to face time, post-visit ordering of testing and documentation.    Vikki Ports, MD

## 2022-03-24 LAB — URINE CULTURE

## 2022-03-27 ENCOUNTER — Other Ambulatory Visit: Payer: Self-pay | Admitting: Physician Assistant

## 2022-03-30 NOTE — Progress Notes (Unsigned)
No chief complaint on file.  Patient presents for follow-up.  He was seen for a virtual visit on 10/23 where he reported feeling very weak--too weak to leave the house, some soreness and achiness in his legs, arms and legs felt weak, and sometimes felt dizzy. There had been some LG fever, and slight confusion at some point during the illness.  Daughter had brought in a urine specimen, showing 3+ leuks, large blood. Culture showed E.coli, sensitive to the cipro that had been prescribed.  He had been advised to schedule f/u with his urologist. Scheduled here, as he felt like he wasn't completely better.   PMH, PSH, SH reviewed   ROS: no fever, chills, URI symptoms. No n/v. ?diarrhea Urinary symptoms?   PHYSICAL EXAM:  There were no vitals taken for this visit.  Wt Readings from Last 3 Encounters:  03/01/22 140 lb (63.5 kg)  02/03/22 139 lb (63 kg)  02/03/22 139 lb (63 kg)     ASSESSMENT/PLAN:   NEEDS URINE DIP Can see if he wants flu shot (and/or COVID) if feeling up to it.

## 2022-03-31 ENCOUNTER — Ambulatory Visit (INDEPENDENT_AMBULATORY_CARE_PROVIDER_SITE_OTHER): Payer: Medicare Other | Admitting: Family Medicine

## 2022-03-31 ENCOUNTER — Encounter: Payer: Self-pay | Admitting: Family Medicine

## 2022-03-31 VITALS — BP 120/74 | HR 72 | Ht 71.0 in | Wt 133.6 lb

## 2022-03-31 DIAGNOSIS — I4891 Unspecified atrial fibrillation: Secondary | ICD-10-CM | POA: Diagnosis not present

## 2022-03-31 DIAGNOSIS — R350 Frequency of micturition: Secondary | ICD-10-CM | POA: Diagnosis not present

## 2022-03-31 DIAGNOSIS — R319 Hematuria, unspecified: Secondary | ICD-10-CM

## 2022-03-31 DIAGNOSIS — N390 Urinary tract infection, site not specified: Secondary | ICD-10-CM

## 2022-03-31 DIAGNOSIS — I6522 Occlusion and stenosis of left carotid artery: Secondary | ICD-10-CM | POA: Diagnosis not present

## 2022-03-31 LAB — POCT URINALYSIS DIP (PROADVANTAGE DEVICE)
Bilirubin, UA: NEGATIVE
Glucose, UA: NEGATIVE mg/dL
Ketones, POC UA: NEGATIVE mg/dL
Nitrite, UA: NEGATIVE
Specific Gravity, Urine: 1.01
Urobilinogen, Ur: 0.2
pH, UA: 6 (ref 5.0–8.0)

## 2022-03-31 MED ORDER — SULFAMETHOXAZOLE-TRIMETHOPRIM 800-160 MG PO TABS: 1.0000 | ORAL_TABLET | Freq: Two times a day (BID) | ORAL | 0 refills | Status: DC

## 2022-03-31 NOTE — Patient Instructions (Addendum)
Please continue to stay well hydrated (keep the urine very pale yellow to clear). Try and get your fluids earlier in the day.  The more you drink in the evenings, the more disrupted your sleep will be.  We are giving you additional antibiotics (switching to Septra) while we wait for the urine culture results from today.  The urine showed some persistent blood, and some white cells, but significantly improved from last time.  The culture will let us know if you need to remain on the antibiotics or if they can be stopped (vs switched).  We are putting in a referral to [facility] so expect a call regarding an appointment within the next 1-2 weeks.  Please contact us if you have develop new problems--fever, abdominal pain, flank pain, vomiting, rash.  I recommend that in addition to getting the flu and COVID vaccines, that you also get the RSV vaccine. This should be separated by 2 weeks from the other vaccines.

## 2022-04-01 ENCOUNTER — Encounter: Payer: Self-pay | Admitting: Family Medicine

## 2022-04-02 LAB — URINE CULTURE

## 2022-04-14 ENCOUNTER — Ambulatory Visit: Payer: Medicare Other | Admitting: Urology

## 2022-04-15 ENCOUNTER — Encounter: Payer: Self-pay | Admitting: Urology

## 2022-04-16 ENCOUNTER — Other Ambulatory Visit (HOSPITAL_BASED_OUTPATIENT_CLINIC_OR_DEPARTMENT_OTHER): Payer: Self-pay

## 2022-04-16 ENCOUNTER — Other Ambulatory Visit (HOSPITAL_COMMUNITY): Payer: Self-pay

## 2022-04-16 ENCOUNTER — Other Ambulatory Visit: Payer: Self-pay

## 2022-04-16 DIAGNOSIS — Z23 Encounter for immunization: Secondary | ICD-10-CM | POA: Diagnosis not present

## 2022-04-16 MED ORDER — COVID-19 MRNA 2023-2024 VACCINE (COMIRNATY) 0.3 ML INJECTION
0.3000 mL | Freq: Once | INTRAMUSCULAR | 0 refills | Status: AC
  Filled 2022-04-16 (×2): qty 0.3, 1d supply, fill #0

## 2022-04-16 MED ORDER — FLUAD QUADRIVALENT 0.5 ML IM PRSY
0.5000 mL | PREFILLED_SYRINGE | INTRAMUSCULAR | 0 refills | Status: DC
  Filled 2022-04-16: qty 0.5, 1d supply, fill #0

## 2022-04-19 ENCOUNTER — Other Ambulatory Visit (HOSPITAL_COMMUNITY): Payer: Self-pay

## 2022-05-06 DIAGNOSIS — H353231 Exudative age-related macular degeneration, bilateral, with active choroidal neovascularization: Secondary | ICD-10-CM | POA: Diagnosis not present

## 2022-05-09 NOTE — Progress Notes (Signed)
No chief complaint on file.  He was last seen in follow-up after UTI in late October/early November.  He no-showed his follow-up visit 11/15 with urology.  Weight loss/frailty: We have been monitoring this, since 13# weight loss was noted at his April wellness visit.  He and his wife had been eating out less, and both had lost weight.   We had discussed increasing caloric intake, having protein shakes.  He has been having 1-2 shakes/day UPDATE He has been eating dinner, and a light lunch (banana and cheese sandwich).  Goes out for breakfast 1-2x/week, when home eats cereal or donuts for breakfast.   Admits he isn't good about his water intake. He has coffee in the morning, tea in the afternoon. These are both reportedly decaff. He has 1-2 beers/day.  UPDATE DIET  Wt Readings from Last 3 Encounters:  03/31/22 133 lb 10.1 oz (60.6 kg)  03/01/22 140 lb (63.5 kg)  02/03/22 139 lb (63 kg)   02/03/22 139 lb (63 kg) 12/16/21 135 lb 3.2 oz (61.3 kg)  10/13/21 138 lb (62.6 kg)  09/09/21 139 lb 12.8 oz (63.4 kg)   03/11/21 149 lb 12.8 oz (67.9 kg)   02/18/21 148 lb 12.8 oz (67.5 kg)  08/2020 152# 3.2 oz 01/2020 157# 6.4 oz 07/2019  172# 3.2 oz   Shoulder pain.  He had pain L>R, worse since his fall in 07/2021. Had series of 3 bilateral glenohumeral Orthovisc injections by ortho, without any benefit on the left, did help the pain on the right shoulder some.  Since he didn't want surgical treatment, rather than MRI, ortho referred him to pain management (all through [facility]). He hasn't heard anything yet.   Atrial fibrillation, HFpEF/pulmonary hypertension, hypertension and aortic stenosis: He denies tachycardia, palpitations. He has had labile BP's, with significant white coat component, though this has improved. He is on blood thinners and denies bleeding, bruising. He is scheduled to f/u with [name] in 07/2022. BP Readings from Last 3 Encounters:  03/31/22 120/74  03/22/22  (!) 102/57  03/01/22 (!) 189/100    H/o Stroke (10/2014) and seizures. 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 He hasn't had any known seizures.   He remains on Eliquis for anticoagulation related to afib for prevention of further strokes.  He denies any bleeding, bruising. Plan is for recheck of carotid artery [place] in 05/2022 (as per cardiology note). He has had B12 deficiency, and is taking a supplement. Level was 282 in 01/2021, and was high at 1357 on last check in 08/2021.    He has persistent tremor, but reports it is not bothersome. It comes and goes, some times better than others. Chronic right foot numbness, mainly noticeable when he is barefoot, not progressing.   IFG--He doesn't check his sugars.   Lab Results  Component Value Date   HGBA1C 5.6 09/09/2021    H/o microalbuminuria, noted in 04/2017. Urine microalbumin/Cr ratio was up to 31 in 07/2019 (after lisinopril had been stopped). Last check was normal, ratio of 19, in 01/2022.   He goes to the eye doctor regularly, for macular degeneration ([name]). He had Avastin injection to right eye last week.  Hyperlipidemia:  Compliant with medications. Lovastatin was changed to 57m of atorvastatin in 05/2021 by his cardiologist. He denies side effects. Tries to follow a lowfat, low cholesterol diet. Lipids were at goal on last check. Lab Results  Component Value Date  CHOL 137 08/17/2021   HDL 60 08/17/2021   LDLCALC 59 08/17/2021   TRIG 95 08/17/2021   CHOLHDL 2.3 08/17/2021       PMH, PSH, SH reviewed   ROS:  no fever, chills, URI symptoms.  No further syncope. No chest pain, shortness of breath. He denies any bleeding. No nausea, vomiting.  Constipation? Urinary symptoms?    PHYSICAL EXAM:  There were no vitals taken for this visit.  Wt Readings from Last 3 Encounters:  03/31/22 133 lb 10.1 oz (60.6 kg)  03/01/22 140 lb  (63.5 kg)  02/03/22 139 lb (63 kg)     Elderly, thin, frail male in no distress HEENT: conjunctiva and sclera are clear, EOMI. Neck: no lymphadenopathy or mass Heart: there is a 3/6 blowing murmur heard throughout. Rhythm is irregularly irregular. Lungs: clear bilaterally Back: no CVA tenderness Abdomen: soft, nontender, no mass Extremities: no edema   ASSESSMENT/PLAN:  Did he see urology in f/u after UTI last month?--looks like he no-showed visit 11/15. Rescheduled? Urinary symptoms? Should recheck urine today  Cbc, c-met B12 if not taking supplement regularly   RSV Consider prevnar-20 (don't give today, would rather not delay RSV further, if he's willing to get)

## 2022-05-10 ENCOUNTER — Ambulatory Visit (INDEPENDENT_AMBULATORY_CARE_PROVIDER_SITE_OTHER): Payer: Medicare Other | Admitting: Family Medicine

## 2022-05-10 ENCOUNTER — Encounter: Payer: Self-pay | Admitting: Family Medicine

## 2022-05-10 VITALS — BP 130/80 | HR 60 | Ht 71.0 in | Wt 133.8 lb

## 2022-05-10 DIAGNOSIS — R54 Age-related physical debility: Secondary | ICD-10-CM

## 2022-05-10 DIAGNOSIS — R350 Frequency of micturition: Secondary | ICD-10-CM | POA: Diagnosis not present

## 2022-05-10 DIAGNOSIS — R8290 Unspecified abnormal findings in urine: Secondary | ICD-10-CM | POA: Diagnosis not present

## 2022-05-10 DIAGNOSIS — G40909 Epilepsy, unspecified, not intractable, without status epilepticus: Secondary | ICD-10-CM | POA: Diagnosis not present

## 2022-05-10 DIAGNOSIS — Z5181 Encounter for therapeutic drug level monitoring: Secondary | ICD-10-CM

## 2022-05-10 DIAGNOSIS — R7303 Prediabetes: Secondary | ICD-10-CM | POA: Diagnosis not present

## 2022-05-10 DIAGNOSIS — I5043 Acute on chronic combined systolic (congestive) and diastolic (congestive) heart failure: Secondary | ICD-10-CM | POA: Diagnosis not present

## 2022-05-10 DIAGNOSIS — I4891 Unspecified atrial fibrillation: Secondary | ICD-10-CM

## 2022-05-10 DIAGNOSIS — E7800 Pure hypercholesterolemia, unspecified: Secondary | ICD-10-CM

## 2022-05-10 DIAGNOSIS — I10 Essential (primary) hypertension: Secondary | ICD-10-CM

## 2022-05-10 DIAGNOSIS — Z7901 Long term (current) use of anticoagulants: Secondary | ICD-10-CM | POA: Diagnosis not present

## 2022-05-10 DIAGNOSIS — Z87440 Personal history of urinary (tract) infections: Secondary | ICD-10-CM

## 2022-05-10 DIAGNOSIS — D649 Anemia, unspecified: Secondary | ICD-10-CM | POA: Diagnosis not present

## 2022-05-10 DIAGNOSIS — E441 Mild protein-calorie malnutrition: Secondary | ICD-10-CM | POA: Diagnosis not present

## 2022-05-10 DIAGNOSIS — R296 Repeated falls: Secondary | ICD-10-CM

## 2022-05-10 LAB — POCT URINALYSIS DIP (PROADVANTAGE DEVICE)
Bilirubin, UA: NEGATIVE
Blood, UA: NEGATIVE
Glucose, UA: NEGATIVE mg/dL
Ketones, POC UA: NEGATIVE mg/dL
Nitrite, UA: NEGATIVE
Protein Ur, POC: 30 mg/dL — AB
Specific Gravity, Urine: 1.02
Urobilinogen, Ur: 0.2
pH, UA: 6 (ref 5.0–8.0)

## 2022-05-10 NOTE — Patient Instructions (Addendum)
Increase your water intake to at least 3-4 large glasses of water daily (6 cups/48 ounces daily, this would work if your glasses are 16 ounces).  We are going to refer you for physical therapy to help get you stronger and hopefully improve balance and prevent falls.  I recommend getting the RSV vaccine (from the pharmacy).  Continue to try and eat plenty of food, with frequent snacks, and continue Ensure at least once daily.  Reschedule appointment with urologist.  We will forward the culture results.

## 2022-05-11 LAB — COMPREHENSIVE METABOLIC PANEL
ALT: 9 IU/L (ref 0–44)
AST: 12 IU/L (ref 0–40)
Albumin/Globulin Ratio: 1.5 (ref 1.2–2.2)
Albumin: 4 g/dL (ref 3.7–4.7)
Alkaline Phosphatase: 52 IU/L (ref 44–121)
BUN/Creatinine Ratio: 24 (ref 10–24)
BUN: 28 mg/dL — ABNORMAL HIGH (ref 8–27)
Bilirubin Total: 0.4 mg/dL (ref 0.0–1.2)
CO2: 24 mmol/L (ref 20–29)
Calcium: 9.1 mg/dL (ref 8.6–10.2)
Chloride: 106 mmol/L (ref 96–106)
Creatinine, Ser: 1.17 mg/dL (ref 0.76–1.27)
Globulin, Total: 2.7 g/dL (ref 1.5–4.5)
Glucose: 100 mg/dL — ABNORMAL HIGH (ref 70–99)
Potassium: 4.7 mmol/L (ref 3.5–5.2)
Sodium: 143 mmol/L (ref 134–144)
Total Protein: 6.7 g/dL (ref 6.0–8.5)
eGFR: 60 mL/min/{1.73_m2} (ref >59)

## 2022-05-11 LAB — CBC WITH DIFFERENTIAL/PLATELET
Basophils Absolute: 0 10*3/uL (ref 0.0–0.2)
Basos: 0 %
EOS (ABSOLUTE): 0.1 10*3/uL (ref 0.0–0.4)
Eos: 1 %
Hematocrit: 30.6 % — ABNORMAL LOW (ref 37.5–51.0)
Hemoglobin: 10.2 g/dL — ABNORMAL LOW (ref 13.0–17.7)
Immature Grans (Abs): 0 10*3/uL (ref 0.0–0.1)
Immature Granulocytes: 0 %
Lymphocytes Absolute: 2.7 10*3/uL (ref 0.7–3.1)
Lymphs: 36 %
MCH: 31.7 pg (ref 26.6–33.0)
MCHC: 33.3 g/dL (ref 31.5–35.7)
MCV: 95 fL (ref 79–97)
Monocytes Absolute: 0.8 10*3/uL (ref 0.1–0.9)
Monocytes: 10 %
Neutrophils Absolute: 3.9 10*3/uL (ref 1.4–7.0)
Neutrophils: 53 %
Platelets: 310 10*3/uL (ref 150–450)
RBC: 3.22 x10E6/uL — ABNORMAL LOW (ref 4.14–5.80)
RDW: 12.4 % (ref 11.6–15.4)
WBC: 7.5 10*3/uL (ref 3.4–10.8)

## 2022-05-11 LAB — VITAMIN B12: Vitamin B-12: 1226 pg/mL (ref 232–1245)

## 2022-05-12 LAB — FERRITIN: Ferritin: 87 ng/mL (ref 30–400)

## 2022-05-12 LAB — SPECIMEN STATUS REPORT

## 2022-05-12 LAB — IRON AND TIBC
Iron Saturation: 24 % (ref 15–55)
Iron: 66 ug/dL (ref 38–169)
Total Iron Binding Capacity: 271 ug/dL (ref 250–450)
UIBC: 205 ug/dL (ref 111–343)

## 2022-05-13 ENCOUNTER — Other Ambulatory Visit: Payer: Self-pay | Admitting: *Deleted

## 2022-05-13 DIAGNOSIS — N390 Urinary tract infection, site not specified: Secondary | ICD-10-CM

## 2022-05-13 LAB — URINE CULTURE

## 2022-05-13 MED ORDER — SULFAMETHOXAZOLE-TRIMETHOPRIM 800-160 MG PO TABS: 1.0000 | ORAL_TABLET | Freq: Two times a day (BID) | ORAL | 0 refills | Status: DC

## 2022-05-14 ENCOUNTER — Other Ambulatory Visit: Payer: Self-pay | Admitting: Cardiovascular Disease

## 2022-05-14 NOTE — Telephone Encounter (Signed)
Please review

## 2022-05-14 NOTE — Telephone Encounter (Signed)
Prescription refill request for Eliquis received. Indication:afib Last office visit:8/23 Scr:1.1 Age: 88 Weight:60.7  kg  Prescription refilled

## 2022-07-14 NOTE — Progress Notes (Unsigned)
Cardiology Office Note   Date:  07/15/2022   ID:  Clynton, Shanholtz 11/26/1932, MRN [id number]  PCP:  Rita Ohara, MD  Cardiologist:   Kathlyn Sacramento, MD   No chief complaint on file.     History of Present Illness: TEX HEIMER is a 89 y.o. male who presents for a follow-up visit regarding paroxysmal atrial fibrillation and aortic stenosis. Other medical problems include hyperlipidemia, TIA,  moderate left carotid stenosis, sleep apnea and borderline diabetes mellitus. He had previous stroke in June 2016 and later was found to have [facility] Fever which was treated with doxycycline.    Nuclear stress test in August of 2016 showed no evidence of ischemia with normal ejection fraction. Echocardiogram in November 2019 showed normal LV systolic function, moderate aortic stenosis with a mean gradient of 21 mmHg and valve area of 0.95.  Pulmonary pressure was moderately elevated at 56 mmHg.  Most recent echocardiogram in July 2023 which showed low normal LV systolic function with moderate LVH, moderate pulmonary hypertension, mild mitral regurgitation and moderate aortic stenosis with mean gradient of 23 mmHg.  He had significant weight loss in 2023 due to poor appetite overall.  He had an emergency room visit in October for weakness and was found to have mild volume depletion. He denies chest pain or worsening dyspnea.  No dizziness.   Past Medical History:  Diagnosis Date   Acute CVA (cerebrovascular accident) ([place])    Acute encephalopathy 11/27/2014   Aortic atherosclerosis ([place]) 11/25/2014   Atrial fibrillation ([place])    a. 12/2018 [place]: Sinus rhythm, avg rate 71. 2 runs of VT (max 17 beats). 27 runs SVT (max 14 beats). Occas PACs/PVCs. No high grade AV block/pauses.   Chronic combined systolic (congestive) and diastolic (congestive) heart failure ([place])    a. 10/2014 Echo: EF 40-45%; b. 03/2018 Echo: EF 55-60%; c. 12/2018 Echo: EF 55-60%, mild conc LVH. Nl RV fxn. RVSP  51.67mHg. Mildly dil LA. Mod AS w/ severe Ca2+; d. 12/2019 Echo: EF 50-55%, mild LVH, gr2 DD. Sev elev PASP. Mild MR, mild to mod TR. Triv AI, mod to sev AS (area 0.77cm^2, mean grad 267mg. Vmax 2.9747m.   Diabetes mellitus    Hypercholesteremia    Hypertension    Impaired fasting glucose    Macular degeneration    Moderate to severe aortic stenosis    a. 12/2018 Echo: EF 55-60%, Mod AS w/ sev AoV Ca2+; b. 12/2019 Echo: EF 50-55%. Mod to sev AS (area 0.77cm^2, mean grad 60m27m Vmax 2.29m/80m  Multiple lacunar infarcts (HCC)    Obstructive sleep apnea 06/27/2018   Prostate cancer (HCC) [facility] + seeding implant (Dr.[name])   Radiation proctitis 11/2012   treated with APC ablation and Canasa suppositories ([facility]   (hosp with FUO, encephalopathy)   Rotator cuff tear, right 10/2010   supraspinatous and infraspinatous   Seizure disorder (HCC) [place]/2021   TIA (transient ischemic attack) 10/02/13   [facility]   Type 2 diabetes mellitus with microalbuminuria or microproteinuria 01/15/2014    Past Surgical History:  Procedure Laterality Date   CATARACT EXTRACTION Right 01/2017   CIRCUMCISION  01/2017   Dr. DavisRosana HoesLONOSCOPY N/A 11/28/2012   Procedure: COLONOSCOPY;  Surgeon: Jay MJerene Bears  Location: [facility]COPY;  Service: Gastroenterology;  Laterality: N/A;   INTERNAL URETHROTOMY  01/27/2016   [facility]   prostate seed implant  ROTATOR CUFF REPAIR  06/16/2011   right (Dr. Berenice Primas)   TONSILLECTOMY AND ADENOIDECTOMY  age 13     Current Outpatient Medications  Medication Sig Dispense Refill   acetaminophen (TYLENOL) 500 MG tablet Take 500 mg by mouth 2 (two) times daily.     atorvastatin (LIPITOR) 20 MG tablet Take 1 tablet (20 mg total) by mouth daily. 90 tablet 3   carvedilol (COREG) 6.25 MG tablet TAKE ONE TABLET TWICE DAILY WITH A MEAL 180 tablet 2   Cyanocobalamin (VITAMIN B 12 PO) Take 1,000 mcg by mouth.     ELIQUIS 5 MG  TABS tablet TAKE 1 TABLET BY MOUTH TWICE DAILY 180 tablet 1   levETIRAcetam (KEPPRA XR) 750 MG 24 hr tablet Take 1 tablet (750 mg total) by mouth at bedtime. 90 tablet 3   mirabegron ER (MYRBETRIQ) 50 MG TB24 tablet Take 1 tablet (50 mg total) by mouth daily. 90 tablet 3   Multiple Vitamins-Minerals (PRESERVISION AREDS) CAPS Take 1 capsule by mouth 2 (two) times daily.      sulfamethoxazole-trimethoprim (BACTRIM DS) 800-160 MG tablet Take 1 tablet by mouth 2 (two) times daily. 14 tablet 0   UNABLE TO FIND Bevacizumab(AVASTIN) chemo injection for the eye every 10 weeks     No current facility-administered medications for this visit.    Allergies:   Patient has no known allergies.    Social History:  The patient  reports that he quit smoking about 42 years ago. His smoking use included cigarettes. He has a 20.00 pack-year smoking history. He has never used smokeless tobacco. He reports current alcohol use of about 7.0 standard drinks of alcohol per week. He reports that he does not use drugs.   Family History:  The patient's family history includes Cancer in his daughter; Congestive Heart Failure in his grandchild; Dementia in his mother; Diabetes in his daughter and mother; Heart disease in his mother and son; Heart disease (age of onset: 53) in his father; Hypertension in his mother; Stroke in his sister.    ROS:  Please see the history of present illness.   Otherwise, review of systems are positive for none.   All other systems are reviewed and negative.    PHYSICAL EXAM: VS:  BP 106/60 (BP Location: Left Arm, Patient Position: Sitting, Cuff Size: Normal)   Pulse 72   Ht 5' 11"$  (1.803 m)   Wt 135 lb 2 oz (61.3 kg)   SpO2 97%   BMI 18.85 kg/m  , BMI Body mass index is 18.85 kg/m. GEN: Well nourished, well developed, in no acute distress  HEENT: normal  Neck: no JVD or masses. Faint bilateral carotid bruits Cardiac: Regular with premature beats; no rubs, or gallops,no edema . There is  a 3/6 crescendo decrescendo murmur in the aortic area which is late peaking with slightly diminished S2 Respiratory:  clear to auscultation bilaterally, normal work of breathing GI: soft, nontender, nondistended, + BS MS: no deformity or atrophy  Skin: warm and dry, no rash Neuro:  Strength and sensation are intact Psych: euthymic mood, full affect   EKG:  EKG is ordered today. The ekg ordered today demonstrates normal sinus rhythm with right bundle branch block.  PACs.   Recent Labs: 09/09/2021: TSH 1.880 05/10/2022: ALT 9; BUN 28; Creatinine, Ser 1.17; Hemoglobin 10.2; Platelets 310; Potassium 4.7; Sodium 143    Lipid Panel    Component Value Date/Time   CHOL 137 08/17/2021 1041   CHOL 184 10/01/2013 1719  TRIG 95 08/17/2021 1041   TRIG 400 (H) 10/01/2013 1719   HDL 60 08/17/2021 1041   HDL 55 10/01/2013 1719   CHOLHDL 2.3 08/17/2021 1041   CHOLHDL 2.3 05/27/2017 1007   VLDL 28 06/02/2016 0714   VLDL 80 (H) 10/01/2013 1719   LDLCALC 59 08/17/2021 1041   LDLCALC 78 05/27/2017 1007   LDLCALC 49 10/01/2013 1719      Wt Readings from Last 3 Encounters:  07/15/22 135 lb 2 oz (61.3 kg)  05/10/22 133 lb 12.8 oz (60.7 kg)  03/31/22 133 lb 10.1 oz (60.6 kg)         ASSESSMENT AND PLAN:  1.  Paroxysmal atrial fibrillation: The patient continues to be in sinus rhythm. He is tolerating anticoagulation with no reported side effects.  I reviewed most recent labs done in December which showed stable anemia with a hemoglobin of 10.2 and creatinine of 1.17.  2. Essential hypertension:   Blood pressure is controlled with carvedilol.  Given recent issues with volume depletion, I elected to discontinue furosemide.  I asked the son to watch for any signs of fluid overload.  3.  Aortic valve stenosis: This was moderate on most recent echocardiogram although I suspect a gradual progression.  His murmur today suggestive of severe stenosis.  Considering his age, mild dementia, poor  functional capacity and comorbidities, I do not think he is a good candidate for TAVR.  4. Hyperlipidemia: Most recent lipid profile showed an LDL of 59.  Continue treatment with atorvastatin.  5. Left carotid stenosis: Most recent carotid Doppler in January 2023 showed stable 60 to 79% stenosis on the left side.  No need to repeat for now unless he becomes symptomatic.  6.  Pulmonary hypertension: This improved on most recent echocardiogram was moderate.    Disposition:   FU in 6 months.  Signed,  Kathlyn Sacramento, MD  07/15/2022 10:04 AM    [place]

## 2022-07-15 ENCOUNTER — Ambulatory Visit: Payer: Medicare Other | Attending: Cardiovascular Disease | Admitting: Cardiovascular Disease

## 2022-07-15 VITALS — BP 106/60 | HR 72 | Ht 71.0 in | Wt 135.1 lb

## 2022-07-15 DIAGNOSIS — I350 Nonrheumatic aortic (valve) stenosis: Secondary | ICD-10-CM | POA: Diagnosis not present

## 2022-07-15 DIAGNOSIS — I6522 Occlusion and stenosis of left carotid artery: Secondary | ICD-10-CM | POA: Diagnosis not present

## 2022-07-15 DIAGNOSIS — E785 Hyperlipidemia, unspecified: Secondary | ICD-10-CM | POA: Diagnosis not present

## 2022-07-15 DIAGNOSIS — I10 Essential (primary) hypertension: Secondary | ICD-10-CM | POA: Insufficient documentation

## 2022-07-15 DIAGNOSIS — I2721 Secondary pulmonary arterial hypertension: Secondary | ICD-10-CM | POA: Insufficient documentation

## 2022-07-15 DIAGNOSIS — I480 Paroxysmal atrial fibrillation: Secondary | ICD-10-CM | POA: Diagnosis not present

## 2022-07-15 NOTE — Patient Instructions (Signed)
Medication Instructions:  STOP the Furosemide  *If you need a refill on your cardiac medications before your next appointment, please call your pharmacy*   Lab Work: None ordered If you have labs (blood work) drawn today and your tests are completely normal, you will receive your results only by: [place] (if you have MyChart) OR A paper copy in the mail If you have any lab test that is abnormal or we need to change your treatment, we will call you to review the results.   Testing/Procedures: None ordered   Follow-Up: At [facility], you and your health needs are our priority.  As part of our continuing mission to provide you with exceptional heart care, we have created designated Provider Care Teams.  These Care Teams include your primary Cardiologist (physician) and Advanced Practice Providers (APPs -  Physician Assistants and Nurse Practitioners) who all work together to provide you with the care you need, when you need it.  We recommend signing up for the patient portal called "MyChart".  Sign up information is provided on this After Visit Summary.  MyChart is used to connect with patients for Virtual Visits (Telemedicine).  Patients are able to view lab/test results, encounter notes, upcoming appointments, etc.  Non-urgent messages can be sent to your provider as well.   To learn more about what you can do with MyChart, go to [email or web address].    Your next appointment:   6 month(s)  Provider:   You may see Kathlyn Sacramento, MD or one of the following Advanced Practice Providers on your designated Care Team:   Murray Hodgkins, NP Christell Faith, PA-C Cadence Kathlen Mody, PA-C Gerrie Nordmann, NP

## 2022-07-17 ENCOUNTER — Other Ambulatory Visit: Payer: Self-pay | Admitting: Urology

## 2022-07-17 DIAGNOSIS — R351 Nocturia: Secondary | ICD-10-CM

## 2022-07-18 ENCOUNTER — Telehealth: Payer: Self-pay | Admitting: Urology

## 2022-07-18 NOTE — Telephone Encounter (Signed)
Please let Jeffrey [name] know that I went ahead and filled his Myrbetriq, but we will need to see him in May for a yearly follow up.

## 2022-07-19 NOTE — Telephone Encounter (Signed)
Patient notified and appointment made

## 2022-07-29 ENCOUNTER — Telehealth: Payer: Self-pay | Admitting: Cardiovascular Disease

## 2022-07-29 NOTE — Telephone Encounter (Signed)
Spoke with Altha Harm at [facility] to authorize Rx for carvedilol 6.25 mg one tablet PO BID #[id number] No refills Pharmacy [facility] system is down.

## 2022-07-29 NOTE — Telephone Encounter (Signed)
*  STAT* If patient is at the pharmacy, call can be transferred to refill team.   1. Which medications need to be refilled? (please list name of each medication and dose if known) carvedilol (COREG) 6.25 MG tablet   2. Which pharmacy/location (including street and city if local pharmacy) is medication to be sent to? [place], [place]   3. Do they need a 30 day or 90 day supply? [place]

## 2022-08-09 NOTE — Progress Notes (Unsigned)
[facility] [address] [place]. [place] [address] [phone]       OFFICE FOLLOW UP NOTE  Jeffrey [name] Date of Birth:  [birth year] Medical Record Number:  [id number]   Referring MD:   Jeffrey [name] Reason for visit: Seizures [facility] provider: Dr. Leonie [name]   No chief complaint on file.     HPI:   Initial visit 05/02/2018 PS: Jeffrey [name] is a pleasant 89 year old Caucasian male was seen today for initial office consultation visit.  He is accompanied by his wife and daughter.  History is obtained from them and review of referral notes.  I have reviewed imaging films in PACS.  He has been having multiple brief episodes of altered awareness for several years but these appear to have increased in the last 6 months and frequency as well as duration.  He had a prolonged episode recently in November 2019 when he went to the ER.  CT scan of the head was obtained which I reviewed showed no acute abnormality except changes of age-related small vessel disease.  He is recently had a few back-to-back episodes as well.  He is unable to identify specific trigger for his episodes.  He is usually staring during these episodes and does not respond to commands.  He does not lose consciousness fall or hurt himself.  His episodes were previously lasting less than a minute the dose of few recent ones have been more prolonged.  Following some of these episodes he tends to get agitated and disoriented.  The patient's daughter feels she is noticed that he does some automatic hand movements and some of these episodes.  He denies any headache or any aura prior to these episodes.  He has no remote history of head injury with loss of consciousness intracranial hemorrhage or strokes.  There is no family history of epilepsy or seizures.  There is no specific pattern for these episodes which may occur once every few months to several in a week.  He has not had an EEG or MRI scan done or a trial of seizure  medications. He had prior history of right anterior frontal lacunar infarct in 11/27/2014 he was felt to have asymptomatic 80 to 90% left ICA stenosis at that time.  Echocardiogram was normal.  He has remote history of [name] spotted fever with encephalitis.  He was treated with a 2-week course of doxycycline.  He did not have any definite seizures at that time.  He had episode of brief staring with a blank look on his face in May 2016 and was admitted to [name] where he had an EEG which was negative for seizures.  He was seen by Jeffrey. Theador [name] neurologist as an outpatient and had a negative work-up for his seizures as well.  He was referred to [name] feels vascular surgeon for left carotid stenosis follow-up with ultrasound in the office showed stenosis to be much less and hence conservative follow-up was recommended.  He was seen by me in the office in March 2017 but has not followed up since then.  He was found to subsequently have a transient episode of paroxysmal A. fib and was started on Eliquis by cardiologist [name].  He also had a second EEG done in September 2016 which was also unremarkable except for mild generalized slowing.  Update 08/07/2018 PS : He returns for follow-up after last visit 3 months ago.  He is accompanied by his wife.  He states he  is doing well.  Is tolerating Keppra XR 500 mg quite well without any side effects.  He has had no spells like seizures or speech difficulties since the last visit.  He has had no stroke or TIA symptoms either.  He underwent EEG on 06/12/2018 which was read by me and was normal.  MRI scan of the brain done on 06/14/2018 which I have personally reviewed the images shows tiny right posterior temporal white matter lacunar infarct which was clinically silent as patient had no symptoms at that time.  Carotid ultrasound previously done on 12/23/2017 had shown 40 to 59% left ICA stenosis no significant stenosis on the right.   Transthoracic echo done on 04/05/2018 and showed normal ejection fraction.  Lipid profile done on 07/05/2018 was unremarkable with LDL cholesterol of 66 mg percent, HDL of 59 and total cholesterol 152 mg percent.  Hemoglobin A1c on 06/28/2018 was 6.0.  Patient has been diagnosed with sleep apnea by his pulmonologist [name] and has started using CPAP every night recently.  He has no new complaints today.  He states his tremors are unchanged and are not functionally disabling.  He does follow-up with [name] from vascular surgery for carotid stenosis and will have follow-up carotid ultrasound done in upcoming visit in the spring  Update 02/20/2019 [name]: [name] is a 89 year old male who is being seen today for follow-up accompanied by wife.  He has been doing well from a neurological standpoint.  He continues on Keppra XR 500 mg daily without recurrent seizure episodes or speech difficulties.  He did have a ED visit on 12/29/2018 for syncopal episode which was felt likely due to orthostatic hypotension with cardiology follow-up and medications adjusted.  Syncopal episode was not felt to be related to seizures.  Denies new or recurring stroke/TIA symptoms as well.  Upper extremity L>R tremors have been stable and do not interfere with daily functioning.  He remains on Eliquis without bleeding or bruising.  He endorses ongoing compliance with CPAP for OSA management.  Blood pressure today elevated at 189/90 but does endorse elevation at doctor's appointments.  He does monitor at home and typically 120s/70s.  Denies new or worsening stroke/TIA symptoms.   Update 09/03/2019 [name]: He returns for follow-up after last visit 6 months ago.  He states he is doing well but wife states that a few weeks ago she noticed episode of transient unresponsiveness where he was staring ahead and not immediately responsive this lasted only a few moments and then returned back to baseline but patient did not remember that.  There  were no automatic movements or lipsmacking noted.  There is no confusion or disorientation or headache.  There is no apparent trigger.  He remains on Keppra Exar 500 mg daily and is tolerating well without side effects.  He remains on Eliquis for his A. fib and has not had any stroke or TIA symptoms.  His blood pressure is significantly elevated today with the patient's wife claims that this on whitecoat hypertension as it is much better at home.  He continues to have intermittent hand tremor but this is not functionally disabling.  He has no other new complaints.   Update 03/05/2020 [name]: He returns for follow-up after last visit 6 months ago.  Is accompanied by his wife.  Wife feels that these episodes of transient dizziness and unresponsiveness and staring ahead is still occurring but she is not sure how frequently is happening.  She gets this is  probably once a month or so.  He has tolerated increasing the dose of Keppra XR 2000 mg daily without side effects.  He continues to have mild tremors in his hands and head but these are not interfering with day-to-day activities and remain unchanged.  Is tolerating Eliquis well without bruising or bleeding.  He had a repeat EEG done on 11/15/2019 which was normal.  He has no new complaints.   Update 09/03/2020 [name]: Returns for 17-monthfollow-up.  Stable since prior visit without any reoccurring seizures or symptoms.  Remains on Keppra XR but per patient report, only taking 1 tablet at night. At prior visit, reported taking 2 tab for '1000mg'$  dosing but he denies ever taking 2 tablets at night.  He is tolerating current dose of Keppra without side effects.  Chronic mild upper extremity tremor with mild progression but does not interfer with daily activity. Hx of A. fib on Eliquis routinely monitored by cardiology.  No concerns at this time.   Update 03/09/2021 [name]: Returns for 682-monthollow-up accompanied by his wife.  ED eval 11/12/2020 for unresponsive episode  with eyes open, staring and altered awareness after mowing his grass. Noted to have taken his initial dose of silodosin for his BPH that same day.  Event similar to his prior episodes.   Reports occasional feeling of "swimmy headedness" when stands too quickly and with increased exposure to heat/sunshine. Per wife, he had an additional event (similar to the one in June) with sensation of dizziness/lightheadedness, impairment of consciousness with unresponsive, and staring.  He does not remember these episodes occurring.  Lasting only short duration.  Denies actual loss of consciousness or passing out, falling, tongue biting, weakness, or autonomic movements. Wife and son able to assist pt back into house and after sitting and drinking water, shortly returned back to baseline.  He will occasionally feel lightheadedness not associated with staring or altered consciousness episodes.  He does admit to limited fluid intake drinking approximately 4-8 oz of water daily. Admits to 1-2 beers/day and occasionally a mixed drink (states "it doesn't make my doctor happy but it makes me happy").   Reports compliance on Keppra XR 500 mg nightly - denies any missed dosages.  He was previously advised to increase dose after similar episode occurred in 07/2019 but this never occurred. When seen on 09/03/2020, he denied any additional events in almost 1 year therefore remained on keppra XR '500mg'$  dosing.   Tremor stable noting improvement since prior visit and can wax/wane.  Does not interfere with daily activity or functioning.  Occasionally can be worse at night - unable to identify trigger.  Recently started on B12 supplement for B12 deficiency.  Reports compliance on Eliquis for secondary stroke prevention measures and history of A. fib managed by cardiology.  Routinely followed by PCP.  No further concerns at this time.    Update 07/20/2021 [name]: [age]ear old male with history of complex partial seizures and tremor.  He is  accompanied by his wife. Stable from seizure standpoint, currently taking Keppra XR 750 mg daily without side effects. No additional seizure activity since dosage increase after prior visit.  Continued tremor noting good days/bad days and can be worse at night. He did have a fall on 2/10 with c/o left shoulder and arm pain - does experience increased shoulder and arm pain at night. Routinely followed by orthopedics receiving bilateral shoulder injections but has not yet been seen since fall. He was evaluated in ED post fall - left  shoulder and arm xray which was negative for fracture, CTH unremarkable, CT cervical no acute findings. Otherwise tremors relatively stable, does not interfere with daily activity or functioning. He also mentions over the past 6 months, experiencing sensation of a sock still on his right foot despite being barefoot typically more around his toes. Denies any worsening since onset and denies any painful symptoms.  Denies back pain or traumatic event preceding symptoms. Was started on B12 supplement a couple months ago for B12 deficiency. Hx of prediabetes but otherwise no history of neuropathy or family history of neuropathy.  Of note, blood pressure elevated today but routinely monitors at home and typically stable.  No further concerns at this time.  Update 02/03/2022 [name]: Patient returns for 50-monthfollow-up accompanied by his wife and daughter  remains on Keppra XR 750 mg daily, denies side effects. Wife reports where he may not be verbally responsive for a few seconds, seems to happen more consistently when standing after sitting for prolonged period of time.  Denies any changes in his speech during these episodes.  He has been having issues with low blood pressure and syncopal events, blood pressure is not checked during these times.  He was seen in the ED 8/12 after syncopal event which was felt to be heat related with possible orthostatic syncope.  He has not yet had follow-up with  cardiology since his event as he was seen by them 8/11.  Admits to limited water intake (<4oz of water/day). Daughter reports weight loss and questions if this could be contributing to low blood pressure.  Does drink Ensure during the day.  Reports continued right foot toe numbness/discomfort, can feel like he is still wearing a tight sock when barefoot.  Denies any progression since prior visit.  Remains on B12 supplement.  Feels like tremors have slightly worsened since prior visit but does not interfere with daily activity or functioning  No further concerns at this time    Update 08/10/2022 [name]: Patient returns for 648-monthollow-up.  Stable from neurological standpoint.  Denies any recent seizure activity, compliant on Keppra XR 500 mg daily, denies side effects.         ROS:   14 system review of systems is positive for those listed in HPI denies and all other systems negative  PMH:  Past Medical History:  Diagnosis Date   Acute CVA ([facility]brovascular accident) ([facility]   Acute encephalopathy 11/27/2014   Aortic atherosclerosis ([facility]/[place]2016   Atrial fibrillation ([facility]   a. 12/2018 [place]: Sinus rhythm, avg rate 71. 2 runs of VT (max 17 beats). 27 runs SVT (max 14 beats). Occas PACs/PVCs. No high grade AV block/pauses.   Chronic combined systolic (congestive) and diastolic (congestive) heart failure ([facility]   a. 10/2014 Echo: EF 40-45%; b. 03/2018 Echo: EF 55-60%; c. 12/2018 Echo: EF 55-60%, mild conc LVH. Nl RV fxn. RVSP 51.35m9m. Mildly dil LA. Mod AS w/ severe Ca2+; d. 12/2019 Echo: EF 50-55%, mild LVH, gr2 DD. Sev elev PASP. Mild Jeffrey, mild to mod TR. Triv AI, mod to sev AS (area 0.77cm^2, mean grad 71m47m Vmax 2.79m/89m  Diabetes mellitus    Hypercholesteremia    Hypertension    Impaired fasting glucose    Macular degeneration    Moderate to severe aortic stenosis    a. 12/2018 Echo: EF 55-60%, Mod AS w/ sev AoV Ca2+; b. 12/2019 Echo: EF 50-55%. 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 01/15/2014    Social History:  Social History   Socioeconomic History   Marital status: Married    Spouse name: [name]   Number of children: 4   Years of education: Not on file   Highest education level: Not on file  Occupational History   Occupation: Retired  Tobacco Use   Smoking status: Former    Packs/day: 1.00    Years: 20.00    Total pack years: 20.00    Types: Cigarettes    Quit date: 05/31/1980    Years since quitting: 42.2   Smokeless tobacco: Never  Vaping Use   Vaping Use: Never used  Substance and Sexual Activity   Alcohol use: Yes    Alcohol/week: 7.0 standard drinks of alcohol    Types: 7 Cans of beer per week    Comment: 1 can of beer most days of the week   Drug use: No   Sexual activity: Not Currently    Partners: Female    Comment: issues with ED  Other Topics Concern   Not on file  Social History Narrative   Married. Retired Engineer, structural.  2 sons, 2 daughters (all in [place]), 15 grandchildren, 9 great grandchildren   Lives with wife, 1 dog.   Right Handed   Drinks 2 cups of caffeine daily   Social Determinants of Health   Financial Resource Strain: Low Risk  (05/07/2021)   Overall Financial Resource Strain (CARDIA)    Difficulty of Paying Living Expenses: Not very hard  Food Insecurity: Not on file  Transportation Needs: No Transportation Needs (05/07/2021)   PRAPARE -  Hydrologist (Medical): No    Lack of Transportation (Non-Medical): No  Physical Activity: Not on file  Stress: Not on file  Social Connections: Not on file  Intimate Partner Violence: Not on file    Medications:   Current Outpatient Medications on File Prior to Visit  Medication Sig Dispense Refill   acetaminophen (TYLENOL) 500 MG tablet Take 500 mg by mouth 2 (two) times daily.     atorvastatin (LIPITOR) 20 MG tablet Take 1 tablet (20 mg total) by mouth daily. 90 tablet 3   carvedilol (COREG) 6.25 MG tablet TAKE ONE TABLET TWICE DAILY WITH A MEAL 180 tablet 2   Cyanocobalamin (VITAMIN B 12 PO) Take 1,000 mcg by mouth.     ELIQUIS 5 MG TABS tablet TAKE 1 TABLET BY MOUTH TWICE DAILY 180 tablet 1   levETIRAcetam (KEPPRA XR) 750 MG 24 hr tablet Take 1 tablet (750 mg total) by mouth at bedtime. 90 tablet 3   mirabegron ER (MYRBETRIQ) 50 MG TB24 tablet TAKE 1 TABLET BY MOUTH DAILY 90 tablet 0   Multiple Vitamins-Minerals (PRESERVISION AREDS) CAPS Take 1 capsule by mouth 2 (two) times daily.      sulfamethoxazole-trimethoprim (BACTRIM DS) 800-160 MG tablet Take 1 tablet by mouth 2 (two) times daily. 14 tablet 0   UNABLE  TO FIND Bevacizumab(AVASTIN) chemo injection for the eye every 10 weeks     No current facility-administered medications on file prior to visit.    Allergies:  No Known Allergies  Physical Exam  There were no vitals filed for this visit.   There is no height or weight on file to calculate BMI.  General: Very Pleasant frail elderly Caucasian male seated, in no evident distress Head: head normocephalic and atraumatic.   Cardiovascular: regular rate and rhythm Musculoskeletal: limited R>L shoulder ROM (chronic) Skin:  no rash/petichiae Vascular:  Normal pulses all extremities  Neurologic Exam Mental Status: Awake and fully alert.  Fluent speech and language.  Oriented to place and time. Recent and remote memory intact. Attention span,  concentration and fund of knowledge appropriate. Mood and affect appropriate.   Cranial Nerves: Pupils equal, briskly reactive to light. Extraocular movements full without nystagmus. Visual fields full to confrontation.  HOH bilaterally. Facial sensation intact. Face, tongue, palate moves normally and symmetrically.  Motor: Normal bulk and tone. Normal strength in all tested extremity muscles. Mild action tremor of outstretched upper extremities left greater than right. Mild LUE tremor at rest. Intermittent head tremor.  Mild Cogwheel rigidity left greater than right wrist upon activation mainly.  No bradykinesia.   Sensory.: intact to touch , pinprick , position and vibratory sensation.  Coordination: Rapid alternating movements normal in all extremities. Finger-to-nose and heel-to-shin performed accurately bilaterally. Gait and Station: Arises from chair without difficulty. Stance is normal. Gait demonstrates normal stride length and balance but slight diminished left arm swing. No festination or stooped posture.  Moderate difficulty performing heel, toe and tandem walk .   Reflexes: 1+ and symmetric. Toes downgoing.       ASSESSMENT/PLAN: 89 year long-standing recurrent transient stereotypical episodes of altered awareness with speech difficulties and starring possibly complex partial seizures.  He also has mild left greater than right upper extremity and head tremor possibly early parkinsonian tremor vs essential tremor which does not appear to be functionally disabling or progressive     Complex partial seizures -Recent episodes consisting of decreased responsiveness and/or syncope.  He is aware of these episodes and not associated with changes of speech or altered mental status.  Advised wife to check blood pressure during these times as this could be related to low blood pressure.  If blood pressure remained stable during these times, may need to consider increasing Keppra dosage -Continue  Keppra XR 750 mg nightly for seizure prophylaxis -Concern of recurrence events (10/2020 ED eval and 02/2021) more consistent with partial type seizure - similar episode 07/2019.  No additional events since keppra dosage increase  Syncope -Possibly due to orthostatic hypotension -Having issues with low blood pressure at home more recently-discussed importance of routine monitoring as well as checking while symptomatic -Encouraged him to schedule follow-up visit with cardiology to further discuss -Discussed importance of increasing water intake earlier in the day to avoid worsening nocturia  Tremor -Mild progression but not bothersome, does not interfere with daily activity or functioning -No indication for treatment at this time -continue to monitor  Right foot numbness -present over the past year- not progressive, mainly bothersome when barefoot -sensory exam stable today -does have pre-diabetes and B12 deficiency -can consider EMG/NCV in the future if symptoms should progress, not currently doing EMG/NCV in office therefore will hold off for now especially as symptoms stable over the past year -continue to monitor - advised to call with any worsening  Follow-up in 6 months or call earlier if needed   CC:  Rita Ohara, MD    I spent 37 minutes of face-to-face and non-face-to-face time with patient and family.  This included previsit chart review, lab review, study review, order entry, electronic health record documentation, patient and family education and discussion regarding above diagnoses and treatment plan and answered all the questions to patient and family satisfaction  Frann Rider, [name]  [facility] [address] [place],  [address]  Phone [phone] Fax [phone] Note: This document was prepared with digital dictation and possible smart phrase technology. Any transcriptional errors that result from this process are  unintentional.

## 2022-08-10 ENCOUNTER — Ambulatory Visit (INDEPENDENT_AMBULATORY_CARE_PROVIDER_SITE_OTHER): Payer: Medicare Other | Admitting: Adult Health

## 2022-08-10 ENCOUNTER — Encounter: Payer: Self-pay | Admitting: Adult Health

## 2022-08-10 VITALS — BP 136/70 | HR 62 | Ht 71.0 in | Wt 135.2 lb

## 2022-08-10 DIAGNOSIS — G20C Parkinsonism, unspecified: Secondary | ICD-10-CM | POA: Diagnosis not present

## 2022-08-10 DIAGNOSIS — G40109 Localization-related (focal) (partial) symptomatic epilepsy and epileptic syndromes with simple partial seizures, not intractable, without status epilepticus: Secondary | ICD-10-CM | POA: Diagnosis not present

## 2022-08-10 NOTE — Patient Instructions (Addendum)
Your Plan:  Continue keppra XR '750mg'$  nightly for seizure prevention  You will be contacted to complete a DaTscan to rule out parkinson's disease     Follow up will be determined after completion of scan     Thank you for coming to see [place] at [facility]. I hope we have been able to provide you high quality care today.  You may receive a patient satisfaction survey over the next few weeks. We would appreciate your feedback and comments so that we may continue to improve ourselves and the health of our patients.     Parkinson's Disease Parkinson's disease causes problems with movements. It makes it harder for you to walk or control your body. It is a long-term condition. It gets worse over time. What are the causes? This condition is caused by a loss of brain cells called neurons. These brain cells make a chemical called dopamine, which is needed to control body movement. It is not known what causes the brain cells to die. What increases the risk? Being male. Being age 22 or older. Having family members who had Parkinson's disease. Having had an injury to the brain. Being around things that are harmful or poisonous, such as pesticides. Being very sad (depressed). What are the signs or symptoms? Symptoms of this condition can vary. The main symptoms can be seen in your movement. These include: Shaking or tremors that you cannot control. This happens while you are resting. Stiffness in your neck, arms, and legs. Trouble making small movements that are needed to button your clothing or brush your teeth. Losing facial expressions. Walking in a way that is not normal. You may walk with short, shuffling steps. Loss of balance when standing. You may sway, fall backward, or have trouble making turns. Other symptoms include: Being very sad or worried. Having false beliefs (delusions). Seeing, hearing, or feeling things that are not real. Trouble speaking or  swallowing. Having a hard time pooping (constipation). Needing to pee right away, peeing often, or not being able to control when you pee or poop. Sleep problems. How is this treated? There is no cure for this disease. The goal of treatment is to manage your symptoms. Treatment may include: Medicines. Therapy to help with talking or movement. Surgery to reduce shaking and other movements that you cannot control. Follow these instructions at home: Medicines Take over-the-counter and prescription medicines only as told by your doctor. Avoid taking pain or sleeping medicines. These medicines affect your thinking. Eating and drinking Follow instructions from your doctor about what you cannot eat or drink. Do not drink alcohol. Activity Ask your doctor if it is safe for you to drive. Do exercises as told by your doctor. Lifestyle  Put in grab bars and railings in your home, especially in the toilet and shower. These help to prevent falls. Do not smoke or use any products that contain nicotine or tobacco. If you need help quitting, ask your doctor. Join a support group. General instructions Talk with your doctor to know the type of help that you need at home. Ask your doctor about ways to stay safe. Keep all follow-up visits. These include going to see a physical therapist, speech therapist, or occupational therapist. Where to find more information [employer] of Neurological Disorders and Stroke: [employer] [place]: www.parkinson.org Contact a doctor if: Medicines do not help your symptoms. You keep losing your balance. You fall at home. You need more help at home. You have trouble swallowing.  You have a very hard time pooping. You have a lot of side effects from your medicines. You feel very sad, worried, or confused. You see, hear, or feel things that are not real. Get help right away if: You were hurt in a fall. You cannot swallow without  choking. You have chest pain or trouble breathing. You do not feel safe at home. You have thoughts about hurting yourself or other people. These symptoms may be an emergency. Get help right away. Call your local emergency services (911 in the [place] Do not wait to see if the symptoms will go away. Do not drive yourself to the hospital. Get help right away if you feel like you may hurt yourself or others, or have thoughts about taking your own life. Go to your nearest emergency room or: Call your local emergency services (911 in the [place] Call the [place] at 302-641-4229 or 988 in the [place] This is open 24 hours a day. Text the Crisis Text Line at (970)301-3941. Summary Parkinson's disease causes problems with movements. It is a long-term condition. It gets worse over time. There is no cure. Treatment focuses on managing your symptoms. Talk with your doctor to know the type of help that you need at home. Ask your doctor about ways to stay safe. Keep all follow-up visits. This information is not intended to replace advice given to you by your health care provider. Make sure you discuss any questions you have with your health care provider. Document Revised: 12/10/2020 Document Reviewed: 09/01/2020 Elsevier Patient Education  [place].

## 2022-08-11 ENCOUNTER — Telehealth: Payer: Self-pay | Admitting: Adult Health

## 2022-08-11 NOTE — Telephone Encounter (Signed)
medicare/BCBS sup NPR sent to [facility] [phone]

## 2022-08-12 DIAGNOSIS — Z961 Presence of intraocular lens: Secondary | ICD-10-CM | POA: Diagnosis not present

## 2022-08-12 DIAGNOSIS — H353231 Exudative age-related macular degeneration, bilateral, with active choroidal neovascularization: Secondary | ICD-10-CM | POA: Diagnosis not present

## 2022-08-13 NOTE — Progress Notes (Signed)
I agree with the above plan 

## 2022-08-20 ENCOUNTER — Encounter: Payer: Self-pay | Admitting: Podiatry

## 2022-09-06 ENCOUNTER — Ambulatory Visit: Payer: Medicare Other | Admitting: Podiatry

## 2022-09-09 ENCOUNTER — Encounter (HOSPITAL_COMMUNITY)
Admission: RE | Admit: 2022-09-09 | Discharge: 2022-09-09 | Disposition: A | Discharge status: Home / Self Care | Payer: Medicare Other | Source: Ambulatory Visit | Attending: Adult Health | Admitting: Adult Health

## 2022-09-09 DIAGNOSIS — G20C Parkinsonism, unspecified: Secondary | ICD-10-CM | POA: Diagnosis not present

## 2022-09-09 MED ORDER — POTASSIUM IODIDE (ANTIDOTE) 130 MG PO TABS
130.0000 mg | ORAL_TABLET | Freq: Once | ORAL | Status: AC
  Administered 2022-09-09: 130 mg via ORAL

## 2022-09-09 MED ORDER — IOFLUPANE I 123 185 MBQ/2.5ML IV SOLN
3.0100 | Freq: Once | INTRAVENOUS | Status: AC | PRN
  Administered 2022-09-09: 3.01 via INTRAVENOUS
  Filled 2022-09-09: qty 5

## 2022-09-09 MED ORDER — POTASSIUM IODIDE (ANTIDOTE) 130 MG PO TABS
ORAL_TABLET | ORAL | Status: AC
  Filled 2022-09-09: qty 1

## 2022-09-16 ENCOUNTER — Ambulatory Visit (INDEPENDENT_AMBULATORY_CARE_PROVIDER_SITE_OTHER): Payer: Medicare Other | Admitting: Podiatry

## 2022-09-16 ENCOUNTER — Encounter: Payer: Self-pay | Admitting: Podiatry

## 2022-09-16 VITALS — BP 165/71 | HR 72

## 2022-09-16 DIAGNOSIS — M79674 Pain in right toe(s): Secondary | ICD-10-CM | POA: Diagnosis not present

## 2022-09-16 DIAGNOSIS — M79675 Pain in left toe(s): Secondary | ICD-10-CM | POA: Diagnosis not present

## 2022-09-16 DIAGNOSIS — E1151 Type 2 diabetes mellitus with diabetic peripheral angiopathy without gangrene: Secondary | ICD-10-CM

## 2022-09-16 DIAGNOSIS — B351 Tinea unguium: Secondary | ICD-10-CM | POA: Diagnosis not present

## 2022-09-16 NOTE — Progress Notes (Signed)
This patient returns to my office for at risk foot care.  This patient requires this care by a professional since this patient will be at risk due to having type 2 diabetes     This patient is unable to cut nails himself since the patient cannot reach his nails.These nails are painful walking and wearing shoes.  This patient presents for at risk foot care today. ? ?General Appearance  Alert, conversant and in no acute stress. ? ?Vascular  Dorsalis pedis and posterior tibial  pulses are  weakly palpable  bilaterally.  Capillary return is within normal limits  bilaterally. Temperature is within normal limits  bilaterally. ? ?Neurologic  Senn-Weinstein monofilament wire test within normal limits  bilaterally. Muscle power within normal limits bilaterally. ? ?Nails Thick disfigured discolored nails with subungual debris  from hallux to fifth toes bilaterally. No evidence of bacterial infection or drainage bilaterally.  Subungual hematoma which is dried with no fluctuation. ? ?Orthopedic  No limitations of motion  feet .  No crepitus or effusions noted.  No bony pathology or digital deformities noted. ? ?Skin  normotropic skin with no porokeratosis noted bilaterally.  No signs of infections or ulcers noted.    ? ?Onychomycosis  Pain in right toes  Pain in left toes  ? ?Consent was obtained for treatment procedures.   Mechanical debridement of nails 1-5  bilaterally performed with a nail nipper.  Filed with dremel without incident.   ? ? ?Return office visit   4  months.                  Told patient to return for periodic foot care and evaluation due to potential at risk complications. ? ? ?Jennifermarie Franzen DPM  ?

## 2022-09-17 ENCOUNTER — Telehealth: Payer: Self-pay | Admitting: Adult Health

## 2022-09-17 NOTE — Telephone Encounter (Signed)
Pt wife called following-up on DAT-SCAN results.

## 2022-09-19 NOTE — Patient Instructions (Incomplete)
HEALTH MAINTENANCE RECOMMENDATIONS:  It is recommended that you get at least 30 minutes of aerobic exercise at least 5 days/week (for weight loss, you may need as much as 60-90 minutes). This can be any activity that gets your heart rate up. This can be divided in 10-15 minute intervals if needed, but try and build up your endurance at least once a week.  Weight bearing exercise is also recommended twice weekly.  Eat a healthy diet with lots of vegetables, fruits and fiber.  "Colorful" foods have a lot of vitamins (ie green vegetables, tomatoes, red peppers, etc).  Limit sweet tea, regular sodas and alcoholic beverages, all of which has a lot of calories and sugar.  Drink a lot of water.  Sunscreen of at least SPF 30 should be used on all sun-exposed parts of the skin when outside between the hours of 10 am and 4 pm (not just when at beach or pool, but even with exercise, golf, tennis, and yard work!)  Use a sunscreen that says "broad spectrum" so it covers both UVA and UVB rays, and make sure to reapply every 1-2 hours.  Remember to change the batteries in your smoke detectors when changing your clock times in the spring and fall.  Carbon monoxide detectors are recommended for your home.  Use your seat belt every time you are in a car, and please drive safely and not be distracted with cell phones and texting while driving.    Jeffrey [name] , Thank you for taking time to come for your Medicare Wellness Visit. I appreciate your ongoing commitment to your health goals. Please review the following plan we discussed and let me know if I can assist you in the future.   This is a list of the screening recommended for you and due dates:  Health Maintenance  Topic Date Due   Zoster (Shingles) Vaccine (1 of 2) Never done   Eye exam for diabetics  08/29/2017   Hemoglobin A1C  03/11/2022   COVID-19 Vaccine (7 - 2023-24 season) 06/11/2022   Flu Shot  12/30/2022   Complete foot exam   09/16/2023   Medicare  Annual Wellness Visit  09/20/2023   DTaP/Tdap/Td vaccine (3 - Td or Tdap) 07/11/2031   Pneumonia Vaccine  Completed   HPV Vaccine  Aged Out    I recommend getting the new shingles vaccine (Shingrix). Since you have Medicare, you will need to get this from the pharmacy, as it is covered by Part D. This is a series of 2 injections, spaced 2 months apart.   This should be separated from other vaccines by at least 2 weeks.  RSV vaccine is recommended.  You should get this from the pharmacy in the Fall, along with your flu shot (you can get them at the same time, vs separate by 2 weeks).  Prevnar-20 (pneumonia vaccine) was given today. COVID booster is recommended. You want to wait 2 weeks from today's vaccine.  Schedule a routine follow-up with your dermatologist.  You have some precancerous areas that need to be frozen.  Verify that you are taking the Myrbetriq. This is the medication for your bladder, from your urologist.  This is supposed to help with your urinary frequency.  They recently send in a refill for you, and you have an appointment (yearly visit) scheduled for May.  Please be sure to drink plenty of water. This helps prevent the dizziness. You know that you are drinking enough when your urine is a very  pale yellow.  The darker it is, the more water you need to be drinking.  You are past due to see the dentist.  Please find a new dentist that takes your insurance, and schedule a routine cleaning.  Cleanings are recommended twice a year.  Please contact the neurologist office to schedule a follow-up visit.  You mentioned that the NP was on maternity leave.  You likely can see Dr. Pearlean Brownie or another provider in that practice.  Resume using your hand weights at least 2x/week. Please get regular exercise. Last year we referred you to physical therapy to help with balance and prevent falls.  Look at home to see if you have any paperwork for home exercises that you should be continuing  to do on a regular basis.  Please bring [place] copies of your Living Will and Healthcare Power of Attorney once completed and notarized so that it can be scanned into your medical chart.  Be sure to drink plenty of water and eat a high fiber diet, to keep your bowels normal.  Bring the lab slips given to you today to a [facility] in [place].  You are due for fasting labs.  You shouldn't have anything other than water or black coffee prior to getting your labs drawn. Try and get these done within the next week or so.

## 2022-09-19 NOTE — Progress Notes (Unsigned)
No chief complaint on file.   Jeffrey [name] is a 89 y.o. male who presents for annual wellness visit and follow-up on chronic medical conditions.    He had E.coli UTI in 02/2022, treated with cipro. Recheck in November was normal.  Repeat culture in December showed recurrent E.coli. He was put on Septra (per sensitivities) and advised to f/u with urologist. He has not yet seen urologist in follow-up.  He is scheduled for visit in May.  He continues on Myrbetriq through their office.  Shoulder pain.  He had pain L>R, worse since his fall in 07/2021. Had series of 3 bilateral glenohumeral Orthovisc injections by ortho, without any benefit on the left, did help the pain on the right shoulder some.  Since he didn't want surgical treatment, rather than MRI, ortho referred him to pain management (all through [facility]). Never saw pain management.  He reports he still has pain, but it is tolerable right now.   Atrial fibrillation, HFpEF/pulmonary hypertension, hypertension and aortic stenosis: He denies tachycardia, palpitations. No chest pain or shortness of breath. He has had labile BP's, with significant white coat component, though this has improved. He is on blood thinners and denies bleeding, bruising. He last saw Dr. Kirke Corin in 07/2022. Furosemide was stopped (low BP's, syncope/orthostasis). Last echo was 11/2021 (mod AV stenosis)--Dr. Kirke Corin suspected gradual progression, felt like murmur suggested severe stenosis.  Did not think he was a good candidate for TAVR.  Left carotid stenosis: Last doppler was in January 2023, stable 60 to 79% stenosis on the left side.  Dr. Kirke Corin said no need for f/u study unless he becomes symptomatic (originally cardiology had rec 1 year f/u).   H/o Stroke (10/2014) and seizures. 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 He hasn't had any known seizures.   He remains on Eliquis  for anticoagulation related to afib for prevention of further strokes.  He denies any bleeding, bruising.  He has had B12 deficiency, and is compliant with taking a supplement daily. He last saw neurology in March, with reported worsening tremor. Noted to have tremor, bradykinesia, rigidity, impaired gait, hypophonia, some cognitive complaints.  They did  DATscan to evaluate for Parkinsonism, DaTSCAN: IMPRESSION: Near absent radiotracer activity within the bilateral putamen and decreased radiotracer activity in the head of the RIGHT caudate nucleus a pattern typical of Parkinson's syndrome pathology. Of note, DaTSCAN is not diagnostic of Parkinsonian syndromes, which remains a clinical diagnosis. DaTscan is an adjuvant test to aid in the clinical diagnosis of Parkinsonian syndromes.  MEDS?? CHANGES??  Falls? LH/dizziness?    Beer doesn't taste good to him anymore, no longer drinking much, just occasional (no longer having 1-2/day). He is drinking 2 tall glasses of water daily.    IFG--He doesn't check his sugars.  He cut back on beer intake, limits sweets. A1c was 5.6% in 08/2021.    H/o microalbuminuria, noted in 04/2017. Urine microalbumin/Cr ratio was up to 31 in 07/2019 (after lisinopril had been stopped). Last check was normal, ratio of 19, in 01/2022.   He goes to the eye doctor regularly, for macular degeneration (Dr. Gwendalyn Ege), Avastin injections to right eye, last was a month ago.   Hyperlipidemia:  Compliant with medications. Lovastatin was changed to 20mg  of atorvastatin in 05/2021 by his cardiologist. He denies side effects. Tries to follow a lowfat, low cholesterol diet. Lipids were at goal on last check, due for recheck. Lab Results  Component Value Date   CHOL 137 08/17/2021   HDL 60 08/17/2021   LDLCALC 59 08/17/2021   TRIG 95 08/17/2021   CHOLHDL 2.3 08/17/2021    Prostate cancer:  He is under care of [facility].  PSAs have been undetectable (last checked  07/2021).  He has his yearly visit scheduled for 09/2022.  They prescribed Myrbetriq , which improved his nocturia, down to 3-4x/night from 5-6 times/night.  They felt that untreated OSA is likely contributing to his nocturia, and encouraged him to re-try CPAP (and offered pulm referral). Lab Results  Component Value Date   PSA1 <0.1 07/16/2021   PSA1 <0.1 09/08/2020    OSA and Pulmonary hypertension:  He is not been using his CPAP. "The treatment is worse than the cure".  States he tried  two different masks, couldn't tolerate them.   Immunization History  Administered Date(s) Administered   COVID-19, mRNA, vaccine(Comirnaty)12 years and older 04/16/2022   Fluad Quad(high Dose 65+) 04/16/2022   Influenza Split 01/30/2012, 02/03/2014, 03/25/2015   Influenza, High Dose Seasonal PF 03/17/2016, 03/04/2017, 02/22/2018, 02/29/2020, 02/19/2021   Influenza-Unspecified 01/29/2013, 03/25/2015, 03/14/2019   PFIZER Comirnaty(Gray Top)Covid-19 Tri-Sucrose Vaccine 09/08/2020   PFIZER(Purple Top)SARS-COV-2 Vaccination 06/06/2019, 06/27/2019, 03/07/2020   Pfizer Covid-19 Vaccine Bivalent Booster 26yrs & up 02/19/2021   Pneumococcal Conjugate-13 10/17/2014   Pneumococcal Polysaccharide-23 12/17/2004, 10/02/2012   Td 10/29/2004   Tdap 07/10/2021   Last colonoscopy: 11/2012 with Dr. Rhea Belton (radiation proctitis)   Last PSA: per urologist, <0.01 07/2021 Dentist: past due, >1 year, dentist retired. UPDATE  Ophtho: twice yearly, though now getting injections frequently. Vision is improved on R, worse on the L  Exercise:  Walks the dog usually 15-20 minutes 2-3x/day, daily. Admits it is not aerobic (stops/sniffs a lot, slow).  Has some hand weights, uses 2x/week.  Patient Care Team: Joselyn Arrow, MD as PCP - General (Family Medicine) Iran Ouch, MD as PCP - Cardiology (Cardiology) [place] Ouch, MD as Consulting Physician (Cardiology) Micki Riley, MD as Consulting Physician  (Neurology) Verner Chol, [facility] (Inactive) as Pharmacist (Pharmacist) GI--Dr. Rhea Belton  (no longer sees) Urologist: [facility]  Dentist: Dr.Taloopis Ophtho: Dr. Gwendalyn Ege (at [facility]), Dr. Otis Brace: [facility] Doctor) Pulmonary: Dr. Sung Amabile (no longer sees) Vascular: Dr. Darrick Penna Derm:  Dr. Adolphus Birchwood in [place] Podiatrist: Dr. Stacie Acres  Depression Screening: Flowsheet Row Clinical Support from 02/03/2022 in [facility]  PHQ-2 Total Score 0       Falls screen:     02/03/2022   10:22 AM 09/09/2021    1:25 PM 03/11/2021   11:19 AM 02/18/2021    2:20 PM 09/08/2020    2:45 PM  Fall Risk   Falls in the past year? 1 1 0 0 0  Number falls in past yr: 1 1 0 0 0  Comment 2/23 tripped w/new boots and fell backwards, 01/09/22 fell in [employer] parking lot, passed out. Wife states he falls so often every week or so she cannot keep count nor does she know any of the dates.      Injury with Fall? 1 1 0 0 0  Comment 2/23-hurt L shoulder, 01/09/22 scrapes and brusies      Risk for fall due to : History of fall(s) Impaired balance/gait No Fall Risks No Fall Risks No Fall Risks  Follow up Falls evaluation completed Falls evaluation completed Falls evaluation completed Falls evaluation completed Falls evaluation completed     Functional Status Survey:    Functional  status survey--notable for decreased hearing and vision (macular degeneration); leakage of urine; Some short-term memory issues (forgetting names of people he hasn't seen in a long time, sometimes forgetting why he walked into a room). No longer drives.       Last year--Clock was incorrect--numbers placed correctly, but not the hands (time was supposed to be 2:30, put one small hand at 2, and the other between 2 and 3, not at the 6).   End of Life Discussion:  He has been given forms in the past, but still hasn't done. Given new forms last year   PMH, PSH, SH and FH were reviewed and updated   ROS: The  patient denies anorexia, fever, headaches, ear pain, hoarseness, chest pain, palpitations, dyspnea on exertion, cough, swelling, nausea, vomiting, abdominal pain, melena, hematochezia, indigestion/heartburn, dysuria, genital lesions, numbness, tingling, weakness, suspicious skin lesions, depression, anxiety, abnormal bleeding/bruising, or enlarged lymph nodes.   +bilateral hearing loss Vision loss related to macular degeneration. Worse on the L, improving on the R. Has some urge incontinence, some dribbling; frequency during the day has improved, nocturia x 3-4.  No bleeding, bruising. No further episodes staring spells since on Keppra Some constipation Intermittent rash on arms, not currently. Memory isn't as good--names, why he walked into a room.  Not worse than last year. Tremor per HPI L shoulder pain More fatigued, falls asleep easily during the day.   PHYSICAL EXAM:  There were no vitals taken for this visit.  Wt Readings from Last 3 Encounters:  08/10/22 135 lb 3.2 oz (61.3 kg)  07/15/22 135 lb 2 oz (61.3 kg)  05/10/22 133 lb 12.8 oz (60.7 kg)   Wt 133# 12.8 oz in 04/2022 Wt 139# 12.8 oz in 08/2021 Wt 149# 12.8 oz in 02/2021 Wt 152# 3.2 oz 08/2020  General Appearance:   Alert, cooperative, no distress, appears stated age. Frail-appearing elderly male  Head:   Normocephalic, without obvious abnormality, atraumatic    Eyes:   PERRL, conjunctiva/corneas clear, EOM's intact. Fundi not examined  Ears:   Normal TM's and external ear canals. No cerumen present  Nose:   Normal without drainage, no sinus tenderness  Throat:   Clear without lesions  Neck:   Supple, no lymphadenopathy; thyroid: no enlargement/tenderness/nodules; bruit noted bilaterally (vs radiation of murmur).   Back:   Spine nontender, no curvature, ROM normal, no CVA tenderness    Lungs:   Clear to auscultation bilaterally without wheezes, rales or ronchi; respirations unlabored    Chest Wall:   No tenderness or  deformity    Heart:   Irregularly irregular. 3/6 SEM heard throughout, loudest at RUSB. Harsh-sounding  Breast Exam:   No chest wall tenderness, masses or gynecomastia    Abdomen:   Soft, non-tender, nondistended, normoactive bowel sounds, no masses, no hepatosplenomegaly.   Genitalia:   Deferred to urologist    Rectal:   Deferred to urologist  Extremities:   No clubbing, cyanosis or edema.     Pulses:   2+ and symmetric all extremities    Skin:   Skin color, texture, turgor normal, no rashes or lesions; Large SK on right face, unchanged.  Purpura on forearms. Dry skin and AK's on forearms. Full skin exam not performed, patient not in gown for visit.  Lymph nodes:   Cervical, supraclavicular nodes normal    Neurologic:   Normal strength, sensation and gait; reflexes 2+ and symmetric throughout. Only very slight tremor in hands, R>L, and slight noted in head/lower jaw  Psych:  Normal mood, affect, eye contact, speech.   ***UPDATE purpura on forearms, AK's  UPDATE if in gown Tremor? Jaw/R>L??   ASSESSMENT/PLAN:  Did he get RSV vaccine from pharmacy? Shingrix? COVID booster? Prevnar-20 recommended today Also COVID vaccine--separate by 2 weeks (can get either from pharm, his choice which he prefers today)  ?no idea what were recs based on imaging study suggesting poss parkinsons--started on any meds?--not really documented in chart. Has no neuro f/u--just empty phone message from last week regarding med reaction??  ***Did he ever get PT (we referred due to frequent falls, balance issues).  Due for fasting labs--?what he wants to do if not fasting--print order to do at [facility] in [place]?  Lipids, A1c with labs, cbc, c-met Do they want [place] to do PSA with labs (>1 yr from last, scheduled for urologist next month, has future orders)  Recommended at least 30 minutes of aerobic activity at least 5 days/week, weight-bearing exercise 2x/wk; proper sunscreen use  reviewed; healthy diet and alcohol recommendations (1 drink/day or less preferred, occasionally 2) reviewed; regular seatbelt use; changing batteries in smoke detectors. Immunization recommendations--encouraged him to get Shingrix from pharmacy, SE reviewed.  Continue yearly flu shots.   RSV Prevnar-20 COVID booster Colonoscopy recommendations reviewed--UTD.   MOST form completed, no changes. Full Code, Full care. Discussed living will and healthcare POA again, and encouraged completion and to give [place] copies to scan into medical chart. UPDATE   Medicare Attestation I have personally reviewed: The patient's medical and social history Their use of alcohol, tobacco or illicit drugs Their current medications and supplements The patient's functional ability including ADLs,fall risks, home safety risks, cognitive, and hearing and visual impairment Diet and physical activities Evidence for depression or mood disorders  The patient's weight, height, BMI have been recorded in the chart.  I have made referrals, counseling, and provided education to the patient based on review of the above and I have provided the patient with a written personalized care plan for preventive services.

## 2022-09-20 ENCOUNTER — Ambulatory Visit (INDEPENDENT_AMBULATORY_CARE_PROVIDER_SITE_OTHER): Payer: Medicare Other | Admitting: Family Medicine

## 2022-09-20 ENCOUNTER — Other Ambulatory Visit: Payer: Self-pay | Admitting: Neurology

## 2022-09-20 ENCOUNTER — Encounter: Payer: Self-pay | Admitting: Family Medicine

## 2022-09-20 VITALS — BP 138/68 | HR 80 | Ht 69.0 in | Wt 134.2 lb

## 2022-09-20 DIAGNOSIS — E11A Type 2 diabetes mellitus without complications in remission: Secondary | ICD-10-CM

## 2022-09-20 DIAGNOSIS — I10 Essential (primary) hypertension: Secondary | ICD-10-CM

## 2022-09-20 DIAGNOSIS — E7800 Pure hypercholesterolemia, unspecified: Secondary | ICD-10-CM

## 2022-09-20 DIAGNOSIS — Z23 Encounter for immunization: Secondary | ICD-10-CM | POA: Diagnosis not present

## 2022-09-20 DIAGNOSIS — Z8546 Personal history of malignant neoplasm of prostate: Secondary | ICD-10-CM

## 2022-09-20 DIAGNOSIS — D6869 Other thrombophilia: Secondary | ICD-10-CM | POA: Diagnosis not present

## 2022-09-20 DIAGNOSIS — E538 Deficiency of other specified B group vitamins: Secondary | ICD-10-CM

## 2022-09-20 DIAGNOSIS — J310 Chronic rhinitis: Secondary | ICD-10-CM | POA: Diagnosis not present

## 2022-09-20 DIAGNOSIS — I480 Paroxysmal atrial fibrillation: Secondary | ICD-10-CM

## 2022-09-20 DIAGNOSIS — G4733 Obstructive sleep apnea (adult) (pediatric): Secondary | ICD-10-CM

## 2022-09-20 DIAGNOSIS — E119 Type 2 diabetes mellitus without complications: Secondary | ICD-10-CM | POA: Diagnosis not present

## 2022-09-20 DIAGNOSIS — Z0000 Encounter for general adult medical examination without abnormal findings: Secondary | ICD-10-CM | POA: Diagnosis not present

## 2022-09-20 DIAGNOSIS — I700 Atherosclerosis of aorta: Secondary | ICD-10-CM | POA: Diagnosis not present

## 2022-09-20 DIAGNOSIS — G40909 Epilepsy, unspecified, not intractable, without status epilepticus: Secondary | ICD-10-CM

## 2022-09-20 DIAGNOSIS — G20C Parkinsonism, unspecified: Secondary | ICD-10-CM | POA: Diagnosis not present

## 2022-09-20 MED ORDER — IPRATROPIUM BROMIDE 0.03 % NA SOLN
2.0000 | Freq: Three times a day (TID) | NASAL | 12 refills | Status: DC | PRN
Start: 2022-09-20 — End: 2024-03-01

## 2022-09-20 MED ORDER — CARBIDOPA-LEVODOPA 25-100 MG PO TABS: 1.0000 | ORAL_TABLET | Freq: Three times a day (TID) | ORAL | 0 refills | Status: DC

## 2022-09-20 NOTE — Telephone Encounter (Signed)
I have routed the result to Dr Teresa Coombs, our work in [place] to review the result in Jessica's absence. Will await response and then contact patient/ pt's wife.

## 2022-09-20 NOTE — Progress Notes (Signed)
Please call and inform patient that his recent scan results are consistent with Parkinson Disease. Will start him on Sinemet 25/100. Take 1 tablet twice daily for 2 weeks then increase to 1 tablet 3 times daily. Please discuss side effects of the medication including nausea, vomiting.

## 2022-09-21 NOTE — Telephone Encounter (Signed)
Called the patient's wife and was able to review the DAT scan results. Advised that the results did appear consistent with parkinson's disease. Advised Dr Teresa Coombs recommends that the patient start a medication carbidopa levodopa. Reviewed the directions and side effects of the medication. She asked that we make sure to send this information over to his PCP. Informed that I would. Was able to schedule for first available with Dr Pearlean Brownie June 27 at 10 am. Advised that I would place on wait list and let them know if something comes available sooner.   Results per Dr Teresa Coombs "Please call and inform patient that his recent scan results are consistent with Parkinson Disease. Will start him on Sinemet 25/100. Take 1 tablet twice daily for 2 weeks then increase to 1 tablet 3 times daily. Please discuss side effects of the medication including nausea, vomiting."

## 2022-09-23 DIAGNOSIS — D6869 Other thrombophilia: Secondary | ICD-10-CM | POA: Diagnosis not present

## 2022-09-23 DIAGNOSIS — I10 Essential (primary) hypertension: Secondary | ICD-10-CM | POA: Diagnosis not present

## 2022-09-23 DIAGNOSIS — E119 Type 2 diabetes mellitus without complications: Secondary | ICD-10-CM | POA: Diagnosis not present

## 2022-09-23 DIAGNOSIS — E7800 Pure hypercholesterolemia, unspecified: Secondary | ICD-10-CM | POA: Diagnosis not present

## 2022-09-23 LAB — LIPID PANEL: LDL Chol Calc (NIH): 75 mg/dL (ref 0–99)

## 2022-09-23 LAB — HEMOGLOBIN A1C

## 2022-09-23 LAB — COMPREHENSIVE METABOLIC PANEL
Albumin/Globulin Ratio: 1.7 (ref 1.2–2.2)
Alkaline Phosphatase: 53 IU/L (ref 44–121)
Chloride: 104 mmol/L (ref 96–106)
Creatinine, Ser: 1.12 mg/dL (ref 0.76–1.27)
Total Protein: 6.8 g/dL (ref 6.0–8.5)
eGFR: 64 mL/min/{1.73_m2} (ref >59)

## 2022-09-23 LAB — CBC WITH DIFFERENTIAL/PLATELET

## 2022-09-24 LAB — COMPREHENSIVE METABOLIC PANEL
ALT: 10 IU/L (ref 0–44)
AST: 18 IU/L (ref 0–40)
Albumin: 4.3 g/dL (ref 3.7–4.7)
BUN/Creatinine Ratio: 25 — ABNORMAL HIGH (ref 10–24)
BUN: 28 mg/dL — ABNORMAL HIGH (ref 8–27)
Bilirubin Total: 0.5 mg/dL (ref 0.0–1.2)
CO2: 23 mmol/L (ref 20–29)
Calcium: 9.3 mg/dL (ref 8.6–10.2)
Globulin, Total: 2.5 g/dL (ref 1.5–4.5)
Glucose: 117 mg/dL — ABNORMAL HIGH (ref 70–99)
Potassium: 4.7 mmol/L (ref 3.5–5.2)
Sodium: 142 mmol/L (ref 134–144)

## 2022-09-24 LAB — LIPID PANEL
Chol/HDL Ratio: 2.4 ratio (ref 0.0–5.0)
Cholesterol, Total: 155 mg/dL (ref 100–199)
HDL: 64 mg/dL (ref >39)
Triglycerides: 87 mg/dL (ref 0–149)
VLDL Cholesterol Cal: 16 mg/dL (ref 5–40)

## 2022-09-24 LAB — CBC WITH DIFFERENTIAL/PLATELET
Basos: 1 %
EOS (ABSOLUTE): 0.1 10*3/uL (ref 0.0–0.4)
Eos: 1 %
Hematocrit: 38.7 % (ref 37.5–51.0)
Hemoglobin: 12.1 g/dL — ABNORMAL LOW (ref 13.0–17.7)
Immature Grans (Abs): 0 10*3/uL (ref 0.0–0.1)
Immature Granulocytes: 0 %
MCHC: 31.3 g/dL — ABNORMAL LOW (ref 31.5–35.7)
MCV: 96 fL (ref 79–97)
Monocytes: 10 %
Neutrophils Absolute: 3.6 10*3/uL (ref 1.4–7.0)
Neutrophils: 52 %
Platelets: 328 10*3/uL (ref 150–450)
RDW: 12.7 % (ref 11.6–15.4)
WBC: 7 10*3/uL (ref 3.4–10.8)

## 2022-09-24 LAB — HEMOGLOBIN A1C: Hgb A1c MFr Bld: 6.1 % — ABNORMAL HIGH (ref 4.8–5.6)

## 2022-10-05 NOTE — Progress Notes (Unsigned)
10/06/2022 10:29 AM   Jeffrey [name] 05/06/1933 [id number]  Referring provider: Joselyn Arrow, MD [address] [place],  [place] [address]  No chief complaint on file.  Urological history 1. Prostate cancer -PSA <0.1, 07/2021 -Completed IMRT + low-dose brachytherapy for Gleason 3+4 prostate cancer March 2011  2.  Urethral stricture -s/p internal urethrotomy and circumcision in September 2018  3. LU TS -contributing factors of age, prostate cancer, pelvic radiation, OSA, HTN, CHF, stroke, diuretics and diabetes -Myrbetriq 50 mg daily  HPI: Jeffrey [name] is a 89 y.o. male who presents today for yearly follow up.   I PSS ***     Score:  1-7 Mild 8-19 Moderate 20-35 Severe     PMH: Past Medical History:  Diagnosis Date   Acute CVA (cerebrovascular accident) (HCC)    Acute encephalopathy 11/27/2014   Aortic atherosclerosis (HCC) 11/25/2014   Atrial fibrillation (HCC)    a. 12/2018 [facility]: Sinus rhythm, avg rate 71. 2 runs of VT (max 17 beats). 27 runs SVT (max 14 beats). Occas PACs/PVCs. No high grade AV block/pauses.   Chronic combined systolic (congestive) and diastolic (congestive) heart failure (HCC)    a. 10/2014 Echo: EF 40-45%; b. 03/2018 Echo: EF 55-60%; c. 12/2018 Echo: EF 55-60%, mild conc LVH. Nl RV fxn. RVSP 51.63mmHg. Mildly dil LA. Mod AS w/ severe Ca2+; d. 12/2019 Echo: EF 50-55%, mild LVH, gr2 DD. Sev elev PASP. Mild MR, mild to mod TR. Triv AI, mod to sev AS (area 0.77cm^2, mean grad . Vmax 2.45m/s).   Diabetes mellitus    Hypercholesteremia    Hypertension    Impaired fasting glucose    Macular degeneration    Moderate to severe aortic stenosis    a. 12/2018 Echo: EF 55-60%, Mod AS w/ sev AoV Ca2+; b. 12/2019 Echo: EF 50-55%. Mod to sev AS (area 0.77cm^2, mean grad . Vmax 2.73m/s).   Multiple lacunar infarcts (HCC)    Obstructive sleep apnea 06/27/2018   Prostate cancer (HCC) radiation + seeding implant (Dr.[name])   Radiation proctitis 11/2012    treated with APC ablation and Canasa suppositories (Dr. Rhea Belton)   RMSF [facility] spotted fever) 10/2014   (hosp with FUO, encephalopathy)   Rotator cuff tear, right 10/2010   supraspinatous and infraspinatous   Seizure disorder (HCC) 07/25/2019   TIA (transient ischemic attack) 10/02/13   hosp at [facility] x 1 night   Type 2 diabetes mellitus with microalbuminuria or microproteinuria 01/15/2014    Surgical History: Past Surgical History:  Procedure Laterality Date   CATARACT EXTRACTION Right 01/2017   CIRCUMCISION  01/2017   Dr. Earlene Plater   COLONOSCOPY N/A 11/28/2012   Procedure: COLONOSCOPY;  Surgeon: Beverley Fiedler, MD;  Location: [facility] END[facility]COPY;  Service: Gastroenterology;  Laterality: N/A;   INTERNAL URETHROTOMY  01/27/2016   WF   prostate seed implant     ROTATOR CUFF REPAIR  06/16/2011   right (Dr. Luiz Blare)   TONSILLECTOMY AND ADENOIDECTOMY  age 63    Home Medications:  Allergies as of 10/06/2022   No Known Allergies      Medication List        Accurate as of Oct 05, 2022 10:29 AM. If you have any questions, ask your nurse or doctor.          acetaminophen 500 MG tablet Commonly known as: TYLENOL Take 500 mg by mouth 2 (two) times daily.   atorvastatin 20 MG tablet Commonly known as: LIPITOR Take 1 tablet (20 mg  total) by mouth daily.   carbidopa-levodopa 25-100 MG tablet Commonly known as: SINEMET IR Take 1 tablet by mouth 3 (three) times daily.   carvedilol 6.25 MG tablet Commonly known as: COREG TAKE ONE TABLET TWICE DAILY WITH A MEAL   Eliquis 5 MG Tabs tablet Generic drug: apixaban TAKE 1 TABLET BY MOUTH TWICE DAILY   ipratropium 0.03 % nasal spray Commonly known as: ATROVENT Place 2 sprays into both nostrils 3 (three) times daily as needed for rhinitis.   levETIRAcetam 750 MG 24 hr tablet Commonly known as: KEPPRA XR Take 1 tablet (750 mg total) by mouth at bedtime.   Myrbetriq 50 MG Tb24 tablet Generic drug: mirabegron ER TAKE 1 TABLET BY MOUTH  DAILY   PreserVision AREDS Caps Take 1 capsule by mouth 2 (two) times daily.   UNABLE TO FIND Bevacizumab(AVASTIN) chemo injection for the eye every 10 weeks   VITAMIN B 12 PO Take 1,000 mcg by mouth.        Allergies: No Known Allergies  Family History: Family History  Problem Relation Age of Onset   Dementia Mother    Diabetes Mother    Heart disease Mother    Hypertension Mother    Heart disease Father 20       Died suddenly.  No diagnosis   Stroke Sister    Cancer Daughter        ovarian (?) vs other male cancer; s/p hyst doing well   Diabetes Daughter        GDM, and now AODM   Heart disease Son        congestive heart failure--improved   Congestive Heart Failure Grandchild     Social History:  reports that he quit smoking about 42 years ago. His smoking use included cigarettes. He has a 20.00 pack-year smoking history. He has never used smokeless tobacco. He reports that he does not currently use alcohol after a past usage of about 3.0 standard drinks of alcohol per week. He reports that he does not use drugs.  ROS: Pertinent ROS in HPI  Physical Exam: There were no vitals taken for this visit.  Constitutional:  Well nourished. Alert and oriented, No acute distress. HEENT: [place] AT, moist mucus membranes.  Trachea midline Cardiovascular: No clubbing, cyanosis, or edema. Respiratory: Normal respiratory effort, no increased work of breathing. GU: No CVA tenderness.  No bladder fullness or masses.  Patient with circumcised/uncircumcised phallus. ***Foreskin easily retracted***  Urethral meatus is patent.  No penile discharge. No penile lesions or rashes. Scrotum without lesions, cysts, rashes and/or edema.  Testicles are located scrotally bilaterally. No masses are appreciated in the testicles. Left and right epididymis are normal. Rectal: Patient with  normal sphincter tone. Anus and perineum without scarring or rashes. No rectal masses are appreciated. Prostate is  approximately *** grams, *** nodules are appreciated. Seminal vesicles are normal. Neurologic: Grossly intact, no focal deficits, moving all 4 extremities. Psychiatric: Normal mood and affect.   Laboratory Data:    Latest Ref Rng & Units 09/23/2022    8:30 AM 05/10/2022    1:04 PM 03/01/2022    4:28 PM  CBC  WBC 3.4 - 10.8 x10E3/uL 7.0  7.5  6.3   Hemoglobin 13.0 - 17.7 g/dL 16.1  09.6  04.5   Hematocrit 37.5 - 51.0 % 38.7  30.6  36.7   Platelets 150 - 450 x10E3/uL 328  310  276        Latest Ref Rng & Units 09/23/2022  8:30 AM 05/10/2022    1:04 PM 03/01/2022    4:28 PM  BMP  Glucose 70 - 99 mg/dL 161  096  045   BUN 8 - 27 mg/dL 28  28  28    Creatinine 0.76 - 1.27 mg/dL 4.09  8.11  9.14   BUN/Creat Ratio 10 - 24 25  24     Sodium 134 - 144 mmol/L 142  143  141   Potassium 3.5 - 5.2 mmol/L 4.7  4.7  4.2   Chloride 96 - 106 mmol/L 104  106  105   CO2 20 - 29 mmol/L 23  24  29    Calcium 8.6 - 10.2 mg/dL 9.3  9.1  9.0   I have reviewed the labs.   Pertinent Imaging: N/A   Assessment & Plan:    1. Prostate cancer -PSA undectable  2.  LUTS -PVR < 300 cc -most bothersome symptoms are nocturia -continue conservative management, avoiding bladder irritants and timed voiding's  3. Urethral stricture -No complaints of splitting stream -PVR minimal  4. Nocturia -he is unsure if the Myrbetriq 50 mg daily if helping with the nocturia, so he will go ahead and discontinue the Myrbetriq to see if his nocturia worsens -I did encourage him to seek out treatment with a CPAP machine, but he is not interested  No follow-ups on file.  These notes generated with voice recognition software. I apologize for typographical errors.  Cloretta Ned  [facility] [address]  [address] [place], [place] [address] [phone]

## 2022-10-06 ENCOUNTER — Encounter: Payer: Self-pay | Admitting: Urology

## 2022-10-06 ENCOUNTER — Ambulatory Visit (INDEPENDENT_AMBULATORY_CARE_PROVIDER_SITE_OTHER): Payer: Medicare Other | Admitting: Urology

## 2022-10-06 VITALS — BP 123/73 | HR 76 | Ht 71.0 in | Wt 140.0 lb

## 2022-10-06 DIAGNOSIS — N35919 Unspecified urethral stricture, male, unspecified site: Secondary | ICD-10-CM | POA: Diagnosis not present

## 2022-10-06 DIAGNOSIS — R351 Nocturia: Secondary | ICD-10-CM | POA: Diagnosis not present

## 2022-10-06 DIAGNOSIS — R399 Unspecified symptoms and signs involving the genitourinary system: Secondary | ICD-10-CM | POA: Diagnosis not present

## 2022-10-06 DIAGNOSIS — C61 Malignant neoplasm of prostate: Secondary | ICD-10-CM | POA: Diagnosis not present

## 2022-10-06 MED ORDER — MIRABEGRON ER 50 MG PO TB24
50.0000 mg | ORAL_TABLET | Freq: Every day | ORAL | 3 refills | Status: DC
Start: 2022-10-06 — End: 2023-10-06

## 2022-10-07 LAB — PSA: Prostate Specific Ag, Serum: <0.1 ng/mL (ref 0.0–4.0)

## 2022-10-08 ENCOUNTER — Encounter: Payer: Self-pay | Admitting: Family Medicine

## 2022-10-18 ENCOUNTER — Telehealth: Payer: Self-pay | Admitting: Family Medicine

## 2022-10-18 NOTE — Telephone Encounter (Signed)
P.A. IPRATROPIUM BROMIDE completed & response states  Drug is covered by current benefit plan. No further PA activity needed Drug Ipratropium Bromide 0.03% solution ePA cloud logo Form [employer] [facility] Medicare Electronic PA Form [id number] NCPDP)

## 2022-10-23 ENCOUNTER — Telehealth: Payer: Self-pay | Admitting: Neurology

## 2022-10-23 NOTE — Telephone Encounter (Signed)
You have seen him for stroke and seizures in the past.  Earlier this year he was diagnosed with Parkinson's disease and Shanda Bumps started him on Sinemet.   He was supposed to be taking 1 pill 3 times a day but apparently has been doing 2 pills twice a day and has had some hallucinations.  His family called the answering service today.   I spoke with his daughter.  I recommended that he go down to 1 pill 3 times a day.  If he continues to have hallucinations this can be cut down further to 1 pill twice a day.   His daughter asked if he can be worked into the schedule sooner.  I let her know I could not schedule but would forward that message

## 2022-10-23 NOTE — Telephone Encounter (Signed)
You have seen him for stroke and seizures in the past.  Earlier this year he was diagnosed with Parkinson's disease and Jessica started him on Sinemet.   He was supposed to be taking 1 pill 3 times a day but apparently has been doing 2 pills twice a day and has had some hallucinations.  His family called the answering service today.   I spoke with his daughter.  I recommended that he go down to 1 pill 3 times a day.  If he continues to have hallucinations this can be cut down further to 1 pill twice a day.   His daughter asked if he can be worked into the schedule sooner.  I let her know I could not schedule but would forward that message     

## 2022-10-26 NOTE — Telephone Encounter (Signed)
Pt daughter (tracy on dpr) came into office, would like to speak with someone about getting pt worked in sooner. States pt is having some severe issues and is messing up taking medication. Would like a call to discuss if pt can be worked in.

## 2022-10-26 NOTE — Telephone Encounter (Signed)
Daughter Adaline Sill. called needing to speak to the provider regarding the on call message that was sent this weekend. Daughter was informed that she was not on the DPR so she would not be able to speak to RN or MD, but pt or designated person would be called to be advised if pt can be fit in this weekend.

## 2022-10-27 NOTE — Telephone Encounter (Signed)
Called the daughter and advised that Maralyn Sago, NP agreed to have the pt come in and be evaluated. Pt is scheduled 5/30 at 2:15 pm with check in of 1:45 pm. She was very appreciative

## 2022-10-27 NOTE — Telephone Encounter (Signed)
Called the daughter back to discuss further. She states that since talking to Dr Epimenio Foot the patient has been doing some better with the visual hallucinations. They states that when they talked with Dr Epimenio Foot it was encouraged that the patient get worked in sooner with MD since it has been several years since MD had laid eyes on the patient. The pt is scheduled on 6/27 with Dr Pearlean Brownie and they feel that he needs to be seen sooner and would prefer to be seen sooner by MD. They states if they couldn't get in with Dr Pearlean Brownie than they would be willing to come in and see Dr Epimenio Foot. Advised that per our policy the patient would have to get approval for a provider switch in order for another provider to see a patient. The daughter indicated that they didn't want to necessarily complete a provider switch but they wanted to have the pt evaluated and ask questions to the MD. Advised that Dr Pearlean Brownie schedule is so limited due to the fact that he is in the office every other week. Advised that he is here the week of June 10 and the week that he is scheduled. Informed that I only have the capability of sending this concern and seeing if Dr Pearlean Brownie would be willing to see him sooner but even so it would be the week after next since he is not in the office next week. I did advise that he was already made aware of the issues and concerns from Dr Bonnita Hollow call and agreed with that plan. After discussing everything I reiterated that I can still ask about the patient being seen by our NP. I advised that this would at least get him in the door and evaluated and assessed. They can still bring their questions and concerns to the visit and advised that if the NP didn't have the answers she can always talk with our work in provider here and try and get the answers. This would at least allow them to be evaluated until they can come back on 6/27 and see Dr Pearlean Brownie. She was agreeable to this plan.  I confirmed with our management team that our NP's  covering for Shanda Bumps can cover if they are able.

## 2022-10-28 ENCOUNTER — Ambulatory Visit (INDEPENDENT_AMBULATORY_CARE_PROVIDER_SITE_OTHER): Payer: Medicare Other | Admitting: Neurology

## 2022-10-28 ENCOUNTER — Telehealth: Payer: Self-pay | Admitting: Neurology

## 2022-10-28 ENCOUNTER — Encounter: Payer: Self-pay | Admitting: Neurology

## 2022-10-28 VITALS — BP 164/75 | HR 79 | Ht 71.0 in | Wt 128.0 lb

## 2022-10-28 DIAGNOSIS — G20C Parkinsonism, unspecified: Secondary | ICD-10-CM | POA: Diagnosis not present

## 2022-10-28 DIAGNOSIS — G40909 Epilepsy, unspecified, not intractable, without status epilepticus: Secondary | ICD-10-CM

## 2022-10-28 DIAGNOSIS — R296 Repeated falls: Secondary | ICD-10-CM

## 2022-10-28 MED ORDER — CARBIDOPA-LEVODOPA 25-100 MG PO TABS: 1.0000 | ORAL_TABLET | Freq: Three times a day (TID) | ORAL | 5 refills | Status: DC

## 2022-10-28 NOTE — Telephone Encounter (Signed)
[facility] is going to take this patient.

## 2022-10-28 NOTE — Patient Instructions (Signed)
Continue the Sinemet 25/100 mg 3 times daily I will order PT/OT via home health  Keep appointment with Dr. Pearlean Brownie  Stand slowly, drink enough water

## 2022-10-28 NOTE — Progress Notes (Signed)
Patient: Jeffrey [name] Date of Birth: [birth year]  Reason for Visit: Follow up History from: Patient, wife, daughter  Primary Neurologist: Pearlean Brownie   ASSESSMENT AND PLAN 89 y.o. year old male   1.  Parkinsonism 2.  Complex partial seizures 3.  Frequent falls 4.  Gait instability  Jeffrey [name] was accidentally taking too much Sinemet 25/100 mg 2 tablets twice daily.  He was experiencing hallucinations, personality changes and frequent falls.  It seems his macular degeneration was playing some part in the visual changes.  Over the last several days he has reduced Sinemet back to 1 tablet 3 times daily, he seems to be improving.  We discussed giving the medication more time to adjust.  He does have tremor, mild to moderate bradykinesia and rigidity RUE > left.  Resting tremor LUE.  I will order home health PT and OT.  He will stand slowly, drink plenty of water, may consider waist compression stockings to prevent orthostatic hypotension, no salt restriction.  We discussed CT of head due to frequent falls on Eliquis and above symptoms.  They deferred for now.  Keep appointment in June with Dr. Pearlean Brownie.  Continue Keppra long-term for seizure prevention.  HISTORY OF PRESENT ILLNESS: Today 10/28/22 I am seeing Jeffrey [name] as a work in visit since his primary NP Shanda Bumps is out on maternity leave, and Dr. Pearlean Brownie is out of the office.  In summary when last seen in March 2024 concern for parkinsonian features with tremor, bradykinesia, rigidity.  DaTscan was consistent with Parkinson's disease.  Started on Sinemet 1 tablet twice daily for 2 weeks then 1 tablet 3 times daily.  Called few days ago WID Dr. Epimenio Foot reporting hallucinations, was actually taking Sinemet 2 pills twice a day.  It was recommended he reduce to 1 pill 3 times a day. They have an appointment 6/27 with Dr. Pearlean Brownie but family wanted patient seen sooner to lay eyes on him.   Here with his wife, daughter Jeffrey [name]. Wants to discuss falls, taking Sinemet  25/100 3 times daily. Has more tremor, trouble with eating. Still having hallucinations, he isn't sure if coming from his MD, may see shadows or double of a person. They think MD vision issues are contributing. Haven't noted any change, doesn't think enough time. Was taking Sinemet 25/100 2 tablets twice daily. Most concerning was the general change in personality. Remains on Keppra XR 750 mg at bedtime, 2 falls last few days,  overtime he has lost 30 lbs, since March 7 lbs. Speech is slurred sometimes.  He was orthostatic on exam systolic dropped from 170 to 135 on exam.   HISTORY  Update 08/10/2022 Jeffrey [name]: Patient returns for 67-month follow-up accompanied by his wife.   Compliant on Keppra XR 500 mg daily, no recent seizure activity, denies side effects.    Believes tremors have continued to gradually worsen since prior visit.  Has been more noticeable at rest but also occur with activity, present in both hands and jaw.  Denies tremors in legs. Feels like he isn't as steady as he used to be. Wife does report some acting out in his sleep. Sleeps well for most part but does have frequent nocturia.  Has issues with constipation. Some swallowing issues with harder/chewy food or water. Some cognitive issues.  Routinely follows with ophthalmology for macular degeneration receiving injections every 10 weeks.  Denies any freezing with ambulation or any recent falls.  Initial visit 05/02/2018 PS: Jeffrey [name] is a pleasant 89 year old Caucasian male  was seen today for initial office consultation visit.  He is accompanied by his wife and daughter.  History is obtained from them and review of referral notes.  I have reviewed imaging films in PACS.  He has been having multiple brief episodes of altered awareness for several years but these appear to have increased in the last 6 months and frequency as well as duration.  He had a prolonged episode recently in November 2019 when he went to the ER.  CT scan of the head was obtained  which I reviewed showed no acute abnormality except changes of age-related small vessel disease.  He is recently had a few back-to-back episodes as well.  He is unable to identify specific trigger for his episodes.  He is usually staring during these episodes and does not respond to commands.  He does not lose consciousness fall or hurt himself.  His episodes were previously lasting less than a minute the dose of few recent ones have been more prolonged.  Following some of these episodes he tends to get agitated and disoriented.  The patient's daughter feels she is noticed that he does some automatic hand movements and some of these episodes.  He denies any headache or any aura prior to these episodes.  He has no remote history of head injury with loss of consciousness intracranial hemorrhage or strokes.  There is no family history of epilepsy or seizures.  There is no specific pattern for these episodes which may occur once every few months to several in a week.  He has not had an EEG or MRI scan done or a trial of seizure medications. He had prior history of right anterior frontal lacunar infarct in 11/27/2014 he was felt to have asymptomatic 80 to 90% left ICA stenosis at that time.  Echocardiogram was normal.  He has remote history of [facility] spotted fever with encephalitis.  He was treated with a 2-week course of doxycycline.  He did not have any definite seizures at that time.  He had episode of brief staring with a blank look on his face in May 2016 and was admitted to [facility] where he had an EEG which was negative for seizures.  He was seen by Dr. Starleen Blue neurologist as an outpatient and had a negative work-up for his seizures as well.  He was referred to Dr. Leonette Most feels vascular surgeon for left carotid stenosis follow-up with ultrasound in the office showed stenosis to be much less and hence conservative follow-up was recommended.  He was seen by me in the office in March  2017 but has not followed up since then.  He was found to subsequently have a transient episode of paroxysmal A. fib and was started on Eliquis by cardiologist Dr. Kirke Corin.  He also had a second EEG done in September 2016 which was also unremarkable except for mild generalized slowing.  REVIEW OF SYSTEMS: Out of a complete 14 system review of symptoms, the patient complains only of the following symptoms, and all other reviewed systems are negative.  See HPI  ALLERGIES: No Known Allergies  HOME MEDICATIONS: Outpatient Medications Prior to Visit  Medication Sig Dispense Refill   acetaminophen (TYLENOL) 500 MG tablet Take 500 mg by mouth 2 (two) times daily.     atorvastatin (LIPITOR) 20 MG tablet Take 1 tablet (20 mg total) by mouth daily. 90 tablet 3   carvedilol (COREG) 6.25 MG tablet TAKE ONE TABLET TWICE DAILY WITH A MEAL 180 tablet  2   Cyanocobalamin (VITAMIN B 12 PO) Take 1,000 mcg by mouth.     ELIQUIS 5 MG TABS tablet TAKE 1 TABLET BY MOUTH TWICE DAILY 180 tablet 1   ipratropium (ATROVENT) 0.03 % nasal spray Place 2 sprays into both nostrils 3 (three) times daily as needed for rhinitis. 30 mL 12   levETIRAcetam (KEPPRA XR) 750 MG 24 hr tablet Take 1 tablet (750 mg total) by mouth at bedtime. 90 tablet 3   mirabegron ER (MYRBETRIQ) 50 MG TB24 tablet Take 1 tablet (50 mg total) by mouth daily. 90 tablet 3   Multiple Vitamins-Minerals (PRESERVISION AREDS) CAPS Take 1 capsule by mouth 2 (two) times daily.      UNABLE TO FIND Bevacizumab(AVASTIN) chemo injection for the eye every 10 weeks     carbidopa-levodopa (SINEMET IR) 25-100 MG tablet Take 1 tablet by mouth 3 (three) times daily. 90 tablet 0   No facility-administered medications prior to visit.    PAST MEDICAL HISTORY: Past Medical History:  Diagnosis Date   Acute CVA (cerebrovascular accident) (HCC)    Acute encephalopathy 11/27/2014   Aortic atherosclerosis (HCC) 11/25/2014   Atrial fibrillation (HCC)    a. 12/2018 [facility]io: Sinus  rhythm, avg rate 71. 2 runs of VT (max 17 beats). 27 runs SVT (max 14 beats). Occas PACs/PVCs. No high grade AV block/pauses.   Chronic combined systolic (congestive) and diastolic (congestive) heart failure (HCC)    a. 10/2014 Echo: EF 40-45%; b. 03/2018 Echo: EF 55-60%; c. 12/2018 Echo: EF 55-60%, mild conc LVH. Nl RV fxn. RVSP 51.90mmHg. Mildly dil LA. Mod AS w/ severe Ca2+; d. 12/2019 Echo: EF 50-55%, mild LVH, gr2 DD. Sev elev PASP. Mild Jeffrey, mild to mod TR. Triv AI, mod to sev AS (area 0.77cm^2, mean grad . Vmax 2.10m/s).   Diabetes mellitus    Hypercholesteremia    Hypertension    Impaired fasting glucose    Macular degeneration    Moderate to severe aortic stenosis    a. 12/2018 Echo: EF 55-60%, Mod AS w/ sev AoV Ca2+; b. 12/2019 Echo: EF 50-55%. Mod to sev AS (area 0.77cm^2, mean grad . Vmax 2.41m/s).   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 Rhea Belton)   [facility] spotted fever) 10/2014   (hosp with FUO, encephalopathy)   Rotator cuff tear, right 10/2010   supraspinatous and infraspinatous   Seizure disorder (HCC) 07/25/2019   TIA (transient ischemic attack) 10/02/13   hosp at [facility] x 1 night   Type 2 diabetes mellitus with microalbuminuria or microproteinuria 01/15/2014    PAST SURGICAL HISTORY: Past Surgical History:  Procedure Laterality Date   CATARACT EXTRACTION Right 01/2017   CIRCUMCISION  01/2017   Dr. Earlene Plater   COLONOSCOPY N/A 11/28/2012   Procedure: COLONOSCOPY;  Surgeon: Beverley Fiedler, MD;  Location: [facility];  Service: Gastroenterology;  Laterality: N/A;   INTERNAL URETHROTOMY  01/27/2016   WF   prostate seed implant     ROTATOR CUFF REPAIR  06/16/2011   right (Dr. Luiz Blare)   TONSILLECTOMY AND ADENOIDECTOMY  age 52    FAMILY HISTORY: Family History  Problem Relation Age of  Onset   Dementia Mother    Diabetes Mother    Heart disease Mother    Hypertension Mother    Heart disease Father 29  Died suddenly.  No diagnosis   Stroke Sister    Cancer Daughter        ovarian (?) vs other male cancer; s/p hyst doing well   Diabetes Daughter        GDM, and now AODM   Heart disease Son        congestive heart failure--improved   Congestive Heart Failure Grandchild     SOCIAL HISTORY: Social History   Socioeconomic History   Marital status: Married    Spouse name: Fleet Contras   Number of children: 4   Years of education: Not on file   Highest education level: Not on file  Occupational History   Occupation: Retired  Tobacco Use   Smoking status: Former    Packs/day: 1.00    Years: 20.00    Additional pack years: 0.00    Total pack years: 20.00    Types: Cigarettes    Quit date: 05/31/1980    Years since quitting: 42.4   Smokeless tobacco: Never  Vaping Use   Vaping Use: Never used  Substance and Sexual Activity   Alcohol use: Not Currently    Alcohol/week: 3.0 standard drinks of alcohol    Types: 3 Cans of beer per week    Comment: e beer 3x/week   Drug use: No   Sexual activity: Not Currently    Partners: Female    Comment: issues with ED  Other Topics Concern   Not on file  Social History Narrative   Married. Retired Emergency planning/management officer.  2 sons, 2 daughters (all in [place]), 15+ grandchildren, 9 great grandchildren   Lives with wife, 1 dog.   Right Handed   Drinks 2 cups of caffeine daily      Updated 08/2022   Social Determinants of Health   Financial Resource Strain: Low Risk  (05/07/2021)   Overall Financial Resource Strain (CARDIA)    Difficulty of Paying Living Expenses: Not very hard  Food Insecurity: Not on file  Transportation Needs: No Transportation Needs (05/07/2021)   PRAPARE - Administrator, Civil Service (Medical): No    Lack of Transportation (Non-Medical): No  Physical Activity: Not on file  Stress: Not on file   Social Connections: Not on file  Intimate Partner Violence: Not on file    PHYSICAL EXAM  Vitals:   10/28/22 1410  BP: (!) 164/75  Pulse: 79  Weight: 128 lb (58.1 kg)  Height: 5\' 11"  (1.803 m)   Body mass index is 17.85 kg/m.  Generalized: Well developed, in no acute distress, elderly male Neurological examination  Mentation: Alert oriented to time, place, history taking. Follows all commands speech and language fluent.  Mild hypophonia. Cranial nerve II-XII: Pupils were equal round reactive to light. Extraocular movements were full, visual field were full on confrontational test. Facial sensation and strength were normal. Head turning and shoulder shrug  were normal and symmetric. Motor: Limited ROM to right upper extremity due to rotator cuff injury, otherwise no significant weakness.  Resting tremor to right hand.  Increased bradykinesia and rigidity greater on the right upper than left. Sensory: Sensory testing is intact to soft touch on all 4 extremities. No evidence of extinction is noted.  Coordination: Cerebellar testing reveals good finger-nose-finger and heel-to-shin bilaterally.  Gait and station: Has to push off from seated position, posture is stooped, hesitancy to initiate stride, short steps, relies on rolling walker Reflexes: Deep tendon reflexes are symmetric and normal bilaterally.   DIAGNOSTIC DATA (LABS, IMAGING,  TESTING) - I reviewed patient records, labs, notes, testing and imaging myself where available.  Lab Results  Component Value Date   WBC 7.0 09/23/2022   HGB 12.1 (L) 09/23/2022   HCT 38.7 09/23/2022   MCV 96 09/23/2022   PLT 328 09/23/2022      Component Value Date/Time   NA 142 09/23/2022 0830   NA 137 10/01/2013 1719   K 4.7 09/23/2022 0830   K 3.6 10/01/2013 1719   CL 104 09/23/2022 0830   CL 101 10/01/2013 1719   CO2 23 09/23/2022 0830   CO2 27 10/01/2013 1719   GLUCOSE 117 (H) 09/23/2022 0830   GLUCOSE 112 (H) 03/01/2022 1628    GLUCOSE 107 (H) 10/01/2013 1719   BUN 28 (H) 09/23/2022 0830   BUN 17 10/01/2013 1719   CREATININE 1.12 09/23/2022 0830   CREATININE 0.78 05/27/2017 1007   CALCIUM 9.3 09/23/2022 0830   CALCIUM 9.4 10/01/2013 1719   PROT 6.8 09/23/2022 0830   PROT 8.4 (H) 10/01/2013 1719   ALBUMIN 4.3 09/23/2022 0830   ALBUMIN 3,155 (L) 11/28/2014 1326   ALBUMIN 4.3 10/01/2013 1719   AST 18 09/23/2022 0830   AST 31 10/01/2013 1719   ALT 10 09/23/2022 0830   ALT 45 10/01/2013 1719   ALKPHOS 53 09/23/2022 0830   ALKPHOS 46 10/01/2013 1719   BILITOT 0.5 09/23/2022 0830   BILITOT 0.4 10/01/2013 1719   GFRNONAA 53 (L) 03/01/2022 1628   GFRNONAA >60 10/01/2013 1719   GFRAA 92 01/23/2020 1151   GFRAA >60 10/01/2013 1719   Lab Results  Component Value Date   CHOL 155 09/23/2022   HDL 64 09/23/2022   LDLCALC 75 09/23/2022   TRIG 87 09/23/2022   CHOLHDL 2.4 09/23/2022   Lab Results  Component Value Date   HGBA1C 6.1 (H) 09/23/2022   Lab Results  Component Value Date   VITAMINB12 1,226 05/10/2022   Lab Results  Component Value Date   TSH 1.880 09/09/2021    Margie Ege, AGNP-C, DNP 10/28/2022, 3:05 PM [facility] [address] [place], [place] [address] [phone]

## 2022-10-29 ENCOUNTER — Telehealth: Payer: Self-pay | Admitting: Neurology

## 2022-10-29 NOTE — Telephone Encounter (Signed)
Tiffany @ [facility] [phone] option 2 reports that pt's daughter is asking they not come out to pt until 06-04

## 2022-11-02 ENCOUNTER — Telehealth: Payer: Self-pay | Admitting: Neurology

## 2022-11-02 ENCOUNTER — Telehealth: Payer: Self-pay | Admitting: Family Medicine

## 2022-11-02 DIAGNOSIS — G4733 Obstructive sleep apnea (adult) (pediatric): Secondary | ICD-10-CM | POA: Diagnosis not present

## 2022-11-02 DIAGNOSIS — Z7901 Long term (current) use of anticoagulants: Secondary | ICD-10-CM | POA: Diagnosis not present

## 2022-11-02 DIAGNOSIS — H3530 Unspecified macular degeneration: Secondary | ICD-10-CM | POA: Diagnosis not present

## 2022-11-02 DIAGNOSIS — Z9181 History of falling: Secondary | ICD-10-CM | POA: Diagnosis not present

## 2022-11-02 DIAGNOSIS — E7800 Pure hypercholesterolemia, unspecified: Secondary | ICD-10-CM | POA: Diagnosis not present

## 2022-11-02 DIAGNOSIS — R296 Repeated falls: Secondary | ICD-10-CM

## 2022-11-02 DIAGNOSIS — R41841 Cognitive communication deficit: Secondary | ICD-10-CM | POA: Diagnosis not present

## 2022-11-02 DIAGNOSIS — G20A1 Parkinson's disease without dyskinesia, without mention of fluctuations: Secondary | ICD-10-CM | POA: Diagnosis not present

## 2022-11-02 DIAGNOSIS — R1312 Dysphagia, oropharyngeal phase: Secondary | ICD-10-CM | POA: Diagnosis not present

## 2022-11-02 DIAGNOSIS — I480 Paroxysmal atrial fibrillation: Secondary | ICD-10-CM | POA: Diagnosis not present

## 2022-11-02 DIAGNOSIS — Z8546 Personal history of malignant neoplasm of prostate: Secondary | ICD-10-CM | POA: Diagnosis not present

## 2022-11-02 DIAGNOSIS — I350 Nonrheumatic aortic (valve) stenosis: Secondary | ICD-10-CM | POA: Diagnosis not present

## 2022-11-02 DIAGNOSIS — I5042 Chronic combined systolic (congestive) and diastolic (congestive) heart failure: Secondary | ICD-10-CM | POA: Diagnosis not present

## 2022-11-02 DIAGNOSIS — S0101XD Laceration without foreign body of scalp, subsequent encounter: Secondary | ICD-10-CM | POA: Diagnosis not present

## 2022-11-02 DIAGNOSIS — Z87891 Personal history of nicotine dependence: Secondary | ICD-10-CM | POA: Diagnosis not present

## 2022-11-02 DIAGNOSIS — E1129 Type 2 diabetes mellitus with other diabetic kidney complication: Secondary | ICD-10-CM | POA: Diagnosis not present

## 2022-11-02 DIAGNOSIS — I110 Hypertensive heart disease with heart failure: Secondary | ICD-10-CM | POA: Diagnosis not present

## 2022-11-02 DIAGNOSIS — G40209 Localization-related (focal) (partial) symptomatic epilepsy and epileptic syndromes with complex partial seizures, not intractable, without status epilepticus: Secondary | ICD-10-CM | POA: Diagnosis not present

## 2022-11-02 NOTE — Telephone Encounter (Signed)
Dr. Lynelle Doctor, Thanks for your note.  I noticed that his weight keeps going down and he is now less than 60 kg which means that we should decrease his dose of Eliquis.  Lisa, Please decrease Eliquis to 2.5 mg twice daily.  Hopefully, this will help given his increased bleeding risk.  At some point, we might have to take him off anticoagulation altogether.

## 2022-11-02 NOTE — Telephone Encounter (Signed)
Would recommend  ER evaluation with imaging to rule out acute problem that could be causing the falls.

## 2022-11-02 NOTE — Telephone Encounter (Signed)
Noted.  Looks like his neurologist was also made aware. Please call patient and see if he needs to be seen by our office for his head injury, or falls.  Please remind him to drink plenty of water. He is on blood thinners, so his falls are a big concern, as far as a risk for internal bleeding, especially when involving his head. If he has acute neuro changes (numbness, weakness), mental status changes, other concerns, he should go to ER for evaluation.  I'm forwarding to his cardiologist as Lorain Childes as well

## 2022-11-02 NOTE — Telephone Encounter (Signed)
Jeffrey [name] PT from [facility] called and wanted you to know Jeffrey [name] has had a fall just about every day. He refuses to be taken to be assessed. He has an abrasion over his right eye and both forearms, as well as a big dry bloody matted area on the left side of his scalp.  She felt is was necessary for you to know what was going on. Provided call back number [phone]

## 2022-11-02 NOTE — Telephone Encounter (Signed)
Call to daughter French Ana, advised that Maralyn Sago recommendations were to have him evaluated at ER for any acute or underlying issues that could be causing falls. Daughter states that they have tried multiple times to take him and encourage him to go for evaluation but he refuses. She states that he is alert and oriented but at times will have some hallucinations. She thought there were [facility] POA papers in the safe but unable to locate them. She denies SI/HI for patient and states that all weapons and ammunition have been taken out of home. She understands and wishes he would agree to go to ER but still wants to respect his wishes at this time. She states that his PCP is supposed to be calling and he may be in agreement to go to their office and after that visit he may be more incline to go to ER. She agrees to communicate via My Chart with any changes or if patient agrees to go for an evaluation. I advised I will let PT know of our conversation as well as update Sarah. Daughter very appreciative of call.

## 2022-11-02 NOTE — Telephone Encounter (Signed)
Called number in chart and it was Jeffrey [name], his daughters. She is very concerned as well and grateful for the call. She said she has tried to take him to the ER and he has refused. She asked if I would please call and try since he thinks the world of Dr. Lynelle Doctor and myself. I did call him. I tried for 10 minutes to schedule an appt and expressed out concern. He said he would think about it and call back if he decided to come in.

## 2022-11-02 NOTE — Telephone Encounter (Signed)
Jeffrey [name], PT w/ [facility] [phone] secure vm)reports the following: Pt admitted today for PT 1 week 1 2 week 2  1 week 6 Request for Speech Therapy order for: Evaluation of speech, swallowing & decreased memory. Request for Skilled Nursing for: medication & disease education [name] also wanted to report that pt has been falling every day since last seen.  Pt had a fall on this past Saturday night(refusing medical attention such as 911, ED , urgent care or PCP) from the fall pt has a large bloody matted scalp on left side(family told [name] that they tried to clean it up some but pt would only allow them to do so much and the scalp just kept bleeding ) [name] said the spot is bloody likely so much because pt is on blood thinner.  Pt has abrasions on right arm, forearms and right eye.

## 2022-11-02 NOTE — Telephone Encounter (Signed)
Call to [facility], PT. She states patient is having multiple falls and head strike but refusing to to to ER, urgent care, or PCP. They have tried to clean area but unable to. PT is requesting skilled nursing. Advised I would let Maralyn Sago know for recommendations.

## 2022-11-04 ENCOUNTER — Telehealth: Payer: Self-pay

## 2022-11-04 ENCOUNTER — Telehealth: Payer: Self-pay | Admitting: Neurology

## 2022-11-04 ENCOUNTER — Ambulatory Visit
Admission: RE | Admit: 2022-11-04 | Discharge: 2022-11-04 | Disposition: A | Discharge status: Home / Self Care | Payer: Medicare Other | Source: Ambulatory Visit | Attending: Neurology | Admitting: Neurology

## 2022-11-04 DIAGNOSIS — R296 Repeated falls: Secondary | ICD-10-CM

## 2022-11-04 DIAGNOSIS — R55 Syncope and collapse: Secondary | ICD-10-CM | POA: Diagnosis not present

## 2022-11-04 DIAGNOSIS — S0003XA Contusion of scalp, initial encounter: Secondary | ICD-10-CM | POA: Diagnosis not present

## 2022-11-04 NOTE — Addendum Note (Signed)
Addended by: Glean Salvo on: 11/04/2022 09:36 AM   Modules accepted: Orders

## 2022-11-04 NOTE — Telephone Encounter (Signed)
Call back to daughter French Ana who reports she call [facility] and the order is not in as STAT and he can't have CT until July. Notified Sarah, NP and order was changed. I called [facility] to verify new STAT order was received but was on hold for a bit. Hung up, call back to daughter and advised that in the system new order is listed as STAT, she states she is on her way to get patient and take him and will call back if there are any problems. French Ana very appreciative of efforts in helping her dad.

## 2022-11-04 NOTE — Telephone Encounter (Signed)
If patient is refusing to go to the ER, I am going to order CT head since multiple falls striking his head. On Eliquis.  Orders Placed This Encounter  Procedures   CT HEAD WO CONTRAST ( )

## 2022-11-04 NOTE — Telephone Encounter (Signed)
Call to daughter French Ana, she states that she did talk to PCP earlier this week and they were going to get him in for a visit but he (patient) told them he was too busy this week and would have to come next week. He also told that to the OT who was trying to schedule a visit. French Ana stated that her dad was refusing to talk to her because he was mad at her. She did state that yesterday he did not fall. Advised that Maralyn Sago put in order at [facility] imaging for head CT to rule Acute intracranial bleed. Daughter French Ana states she will call her sister and they will make every effort to get him to agree to go. She understands risk of increased bleeding due to eliquis. French Ana agrees to follow up with [place] with outcome of today's efforts.   Also called a followed up with [name], PT and advised of latest plan to get CT if patient amendable. Appreciative of call.

## 2022-11-04 NOTE — Addendum Note (Signed)
Addended by: Glean Salvo on: 11/04/2022 06:51 AM   Modules accepted: Orders

## 2022-11-04 NOTE — Telephone Encounter (Signed)
Jeffrey [name] from [employer] called wanting to inform provider that the pt declined the Evaluation today. Spoke with RN April and was informed that pt is to receive PT, OT, and Speech but continues to postpone getting evaluated. Any questions call Therapist Darral Dash at [phone]

## 2022-11-04 NOTE — Telephone Encounter (Signed)
Call to daughter French Ana with result of CT, she verbalized understanding. She did states that patient "fell out" again this morning but the caught him. She is not sure if it's related to his sinemet but she does state that it happens randomly, not just when he is changing positions. She states she is going to reach out to PCP about having labs drawn before visit next week. Daughter appreciative of help with her dad and will keep [place] posted with outcome of next week visit with PCP.

## 2022-11-04 NOTE — Telephone Encounter (Signed)
Please call, CT head showed no acute abnormality.  It does show a left parietal scalp hematoma.  However no acute intracranial abnormality.  Continues to show generalized cerebral atrophy and chronic microvascular changes. Let me know if any other issues. Thanks  IMPRESSION: 1. No acute intracranial abnormality. 2. Moderate generalized cerebral atrophy and chronic microvascular ischemic changes of the white matter. 3. Left parietal scalp hematoma without evidence of underlying fracture.

## 2022-11-04 NOTE — Telephone Encounter (Signed)
Call to daughter to see if patient went to [facility], no answer. Left message

## 2022-11-05 ENCOUNTER — Encounter: Payer: Self-pay | Admitting: Family Medicine

## 2022-11-05 MED ORDER — APIXABAN 2.5 MG PO TABS: 2.5000 mg | ORAL_TABLET | Freq: Two times a day (BID) | ORAL | 11 refills | Status: DC

## 2022-11-05 NOTE — Telephone Encounter (Signed)
Patient's daughter has been made aware and verbalized her understanding. New script has been sent in.

## 2022-11-08 ENCOUNTER — Telehealth: Payer: Self-pay | Admitting: Neurology

## 2022-11-08 NOTE — Telephone Encounter (Signed)
Pedro Earls with [facility] calling, states speech therapy evaluation for pt needs to be moved to next week 11/15/2022 [phone] can lvm

## 2022-11-08 NOTE — Telephone Encounter (Signed)
Okay. Can do ST as soon as possible that works for patient and home health. Main concern was PT/OT.

## 2022-11-08 NOTE — Telephone Encounter (Signed)
Returned call to [employer] speech therapist for [facility] and stated okay to move appt to next week as she had previously asked

## 2022-11-09 NOTE — Progress Notes (Signed)
Chief Complaint  Patient presents with   Fall    Patient is here due to frequent falls. Daughter reports that he is having "black outs" almost daily that are causing he falls, had one in the waiting room when they first got here coming in. They think the heat contributes. They had to grab the wheelchair to get him inside today.    Patient presents to discuss his falls, and f/u on recent fall.  He is accompanied by his wife and daughter.   We received phone call 6/4: Murriel Hopper PT from [facility] called and wanted you to know Jeffrey [name] has had a fall just about every day. He refuses to be taken to be assessed. He has an abrasion over his right eye and both forearms, as well as a big dry bloody matted area on the left side of his scalp.     They also called his neurologist. They ultimately were able to get him to get a CT of his head, to r/o intracranial bleed (due to his falls, head injury, and being on eliquis). CT was done 6/6: IMPRESSION: 1. No acute intracranial abnormality. 2. Moderate generalized cerebral atrophy and chronic microvascular ischemic changes of the white matter. 3. Left parietal scalp hematoma without evidence of underlying fracture.  Last saw neuro 5/30--this was f/u after he had reported hallucinations, personality changes, frequent falls. He had been taking too high of a Sinemet dose in error (2 BID rather than 1 TID).  Macular degeneration may contribute some.  He was given referral for home PT/OT. He was orthostatic at the visit--encouraged to drink more water, stand slowly, to consider waist compression stockings. He has appt with Dr. Pearlean Brownie scheduled for 6/27.  His Eliquis dose was recently decreased (due to weight loss).  Weight loss--at one point he had decreased appetite, but states that appetite has improved.  Other than eliminating beer entirely, he states his diet hasn't changed. Family hasn't noticed a difference in his food intake.  They do report  that he has some trouble eating due to trouble lifting his R arm due to shoulder pain, and maybe some swallowing problems.    He had constipation 4-6 weeks ago.  Seemed to resolve on its own, and had some diarrhea.  A week ago he reports his diarrhea recurred.  Had a bout for a week that was more severe.  They report that he has a "delicate" stomach and has used imodium off/on for many years. Most recently he reports he has having some loose stools 2x/day.  Not black or bloody.  Occurs soon after a meal, no related to any particular food. He states that his last episode of diarrhea was 2-3 days ago.  He is getting the "urge" to have diarrhea, and has been taking imodium once daily when he gets this urge. Eats a lot of cheese, milk in cereal.  He reports he stopped the sinimet when eliquis dose was recently lowered.  Regarding his falls--family states they can recognize when it is going to happen--he gets a blank look, described as a "spell", and that he stiffens up.  They haven't let him be alone, and he hasn't had any further falls in the last few days--family has been able to catch/help before falling.. Patient reports feeling unsteady.  This is why home PT was ordered by neuro. They state that he hasn't fallen with the support from a a walker, that he is able to grab hold.    PMH, PSH, SH  reviewed  Reviewed past history.  Sees cardiologist (who was contacted about his recent falls, noted the weight loss and adjusted is eliquis dose; may re-evaluate safety of anticoagulant use in him).  Last echo 11/2021 IMPRESSIONS   1. Left ventricular ejection fraction, by estimation, is 50 to 55%. The  left ventricle has low normal function. The left ventricle has no regional  wall motion abnormalities. There is moderate left ventricular hypertrophy.  Left ventricular diastolic  parameters are consistent with Grade II diastolic dysfunction  (pseudonormalization).   2. Right ventricular systolic  function is normal. The right ventricular  size is normal. There is moderately elevated pulmonary artery systolic  pressure. The estimated right ventricular systolic pressure is 45.7 mmHg.   3. Left atrial size was moderately dilated.   4. The mitral valve is normal in structure. Mild mitral valve  regurgitation. No evidence of mitral stenosis.   5. The aortic valve is normal in structure. There is moderate  calcification of the aortic valve. Aortic valve regurgitation is not  visualized. Moderate aortic valve stenosis. Aortic valve area, by VTI  measures 1.42 cm. Aortic valve mean gradient  measures 23.0 mmHg. Aortic valve Vmax measures 2.93 m/s.   6. The inferior vena cava is normal in size with greater than 50%  respiratory variability, suggesting right atrial pressure of 3 mmHg.   7. Unable to exclude a small membrinous VSD with trivial left to right  doppler shunt noted.   Under the care of urologist, last seen 10/06/22. Prostate cancer, nocturia. PSA was undetectable.  Urine wasn't checked at that vsit.  Outpatient Encounter Medications as of 11/10/2022  Medication Sig Note   acetaminophen (TYLENOL) 500 MG tablet Take 500 mg by mouth 2 (two) times daily. 12/16/2021: Takes 2 tablets BID   apixaban (ELIQUIS) 2.5 MG TABS tablet Take 1 tablet (2.5 mg total) by mouth 2 (two) times daily.    atorvastatin (LIPITOR) 20 MG tablet Take 1 tablet (20 mg total) by mouth daily.    carvedilol (COREG) 6.25 MG tablet TAKE ONE TABLET TWICE DAILY WITH A MEAL    Cyanocobalamin (VITAMIN B 12 PO) Take 1,000 mcg by mouth.    levETIRAcetam (KEPPRA XR) 750 MG 24 hr tablet Take 1 tablet (750 mg total) by mouth at bedtime.    loperamide (IMODIUM) 2 MG capsule Take 2 mg by mouth as needed for diarrhea or loose stools. 11/10/2022: Last dose Monday   mirabegron ER (MYRBETRIQ) 50 MG TB24 tablet Take 1 tablet (50 mg total) by mouth daily.    Multiple Vitamins-Minerals (PRESERVISION AREDS) CAPS Take 1 capsule by mouth  2 (two) times daily.     UNABLE TO FIND Bevacizumab(AVASTIN) chemo injection for the eye every 10 weeks 11/10/2022: Having tomorrow   carbidopa-levodopa (SINEMET IR) 25-100 MG tablet Take 1 tablet by mouth 3 (three) times daily. (Patient not taking: Reported on 11/10/2022) 11/10/2022: Stopped taking 5-6 days due to severe diarrhea   ipratropium (ATROVENT) 0.03 % nasal spray Place 2 sprays into both nostrils 3 (three) times daily as needed for rhinitis. (Patient not taking: Reported on 11/10/2022) 11/10/2022: Few times a week   No facility-administered encounter medications on file as of 11/10/2022.   No Known Allergies   ROS: no f/c, URI symptoms.  No chest pain, palpitations, shortness of breath.  No edema.  No bleeding, other than from cuts/wounds related to falls.  Dizziness, unsteadiness, nocturia. Bowel issues per above.  Denies dysuria, hematuria, no change in urinary symptoms. See HPI  PHYSICAL EXAM:  BP 110/76 (BP Location: Left Arm, Cuff Size: Normal)   Pulse 72   Ht 5\' 11"  (1.803 m)   Wt 129 lb 3.2 oz (58.6 kg)   BMI 18.02 kg/m   130/70 by MD  Nurses had a very difficult time getting his blood pressure (orthostatics)--very hard to hear.  Was unable to hear it in standing position.  Pauses may have contributed to their difficulties. BP 110/60's-70's laying and sitting, pulse 64 laying, 72 sitting and standing.  Orthostatic Vitals for the past 48 hrs (Last 6 readings):  BP Pulse BP Location BP Method Cuff Size Patient Position (if appropriate)  11/10/22 1052 110/62 72 -- -- -- --  11/10/22 1116 110/76 64 Left Arm Manual Normal Lying  11/10/22 1117 -- 72 -- -- -- --  11/10/22 1210 (!) 148/102 -- Left Arm Manual Normal Sitting     Wt Readings from Last 3 Encounters:  11/10/22 129 lb 3.2 oz (58.6 kg)  10/28/22 128 lb (58.1 kg)  10/06/22 140 lb (63.5 kg)  09/20/22 134# 3.2 oz  Elderly, frail mail, in no distress. HEENT: conjunctiva and sclera are clear, EOMI.  Eschar/scab   4 x 3.5 cm at L parietal scalp. No surrounding soft tissue swelling or erythema.  Minimally tender. Skin: thin.  Scabs and skin tears noted at L elbow, L forearm, L knee. Skin is a little dry. Normal turgor Neck: no lymphadenopathy or mass Heart: loud holosystolic murmur, unchanged.  Fairly regular rhythm with intermittent skipped beats/pauses. Lung: clear bilaterally Back: no spinal or CVA tenderness Abdomen: soft, nontender, no mass Extremities: no edema Neuro: alert and oriented.  Normal speech  cranial nerves grossly intact. He has fairly flat appearance overall, but intermittently makes a joke/smiles. A little unsteady with standing.   Urine:  Large leuks   ASSESSMENT/PLAN:  Frequent falls - ?etiology.  Poss some orthostatic component. Encouraged fluids. Diarrhea not currently a factor. Cont home PT. Use of walker recommended - Plan: POCT Urinalysis DIP (Proadvantage Device), Urine Culture  Essential hypertension, benign - well controlled. To monitor BP at home, and consider cutting carvedilol in 1/2 if remains low. - Plan: POCT Urinalysis DIP (Proadvantage Device)  Paroxysmal atrial fibrillation (HCC) - under care of cardiology, needs to schedule f/u. Briefly discussed risks of anticoag and falls. Rate is controlled; lowering coreg could affect  Hematoma - L parietal scalp.  Noninfected.  discussed gentle cleaning to avoid re-bleeding - Plan: CBC with Differential/Platelet, Ferritin  Orthostatic hypotension - may contribute.  He was having diarrhea.  Fluid intake has improved a little.  Not orthostatic today. Hydrate, stand slowly - Plan: CBC with Differential/Platelet, Basic metabolic panel  Weight loss - causes discussed--fluid shifts (related to diarrhea, poss dehydration), inadequate oral intake, others--encouraged healthy diet/freq snacks. - Plan: CBC with Differential/Platelet, Basic metabolic panel, TSH  Anemia, unspecified type - on anticoagulants; recheck CBC due to  hematoma and bleeding from wounds. - Plan: CBC with Differential/Platelet, Ferritin  Abnormal urinalysis - pt under care of urologist; u/a not checked at recent visit. Pt w/o complaints, but will check to r/o other causes fo weakness/problems/falls - Plan: Urine Culture  Diarrhea, unspecified type - may have some IBS, as well as constipation contributing. Trial lactose-free diet, BRAT diet. Avoid imodium preventatively, just w/diarrhea  Cbc, b-met, TSH  Call with results. (Pt's daughter sees MyChart, not pt).  I spent 55 minutes dedicated to the care of this patient, including pre-visit review of records, face to face time, post-visit ordering  of testing and documentation.   Try higher table, rest arm on table. See if this makes eating easier with your shoulder pain.  Try and avoid dairy products for the next week. You can use non-dairy milk, or consider using a lactaid tablet prior to dairy if you aren't able to avoid it entirely. BRAT diet--bananas, rice, applesauce and toast  Generally we don't use imodium preventatively, as this may cause significant constipation. We also don't know if your diarrhea is better, so can't tell if the diarrhea is related to the medication or not. Take imodium only with/after loose stools. Try and eat a high fiber diet.  Schedule your cardiology visit (due in August, likely need echo beforehand).  Consider cuting your dose of Carvedilol in half (current tablet is 6.25mg ), continue to take it twice daily, if your home BP's are also low. Monitor your blood pressures at home. Follow up with Dr. Kirke Corin sooner than August if blood pressure is <110/70 even with the drop in dose. If blood pressure goes very high, then increase back to the full tablet.

## 2022-11-10 ENCOUNTER — Encounter: Payer: Self-pay | Admitting: Family Medicine

## 2022-11-10 ENCOUNTER — Ambulatory Visit (INDEPENDENT_AMBULATORY_CARE_PROVIDER_SITE_OTHER): Payer: Medicare Other | Admitting: Family Medicine

## 2022-11-10 VITALS — BP 148/102 | HR 72 | Ht 71.0 in | Wt 129.2 lb

## 2022-11-10 DIAGNOSIS — I10 Essential (primary) hypertension: Secondary | ICD-10-CM

## 2022-11-10 DIAGNOSIS — R296 Repeated falls: Secondary | ICD-10-CM | POA: Diagnosis not present

## 2022-11-10 DIAGNOSIS — T148XXA Other injury of unspecified body region, initial encounter: Secondary | ICD-10-CM | POA: Diagnosis not present

## 2022-11-10 DIAGNOSIS — R634 Abnormal weight loss: Secondary | ICD-10-CM

## 2022-11-10 DIAGNOSIS — R197 Diarrhea, unspecified: Secondary | ICD-10-CM

## 2022-11-10 DIAGNOSIS — I480 Paroxysmal atrial fibrillation: Secondary | ICD-10-CM | POA: Diagnosis not present

## 2022-11-10 DIAGNOSIS — D649 Anemia, unspecified: Secondary | ICD-10-CM

## 2022-11-10 DIAGNOSIS — I951 Orthostatic hypotension: Secondary | ICD-10-CM | POA: Diagnosis not present

## 2022-11-10 DIAGNOSIS — R8290 Unspecified abnormal findings in urine: Secondary | ICD-10-CM

## 2022-11-10 LAB — POCT URINALYSIS DIP (PROADVANTAGE DEVICE)
Bilirubin, UA: NEGATIVE
Blood, UA: NEGATIVE
Glucose, UA: NEGATIVE mg/dL
Ketones, POC UA: NEGATIVE mg/dL
Nitrite, UA: NEGATIVE
Protein Ur, POC: 30 mg/dL — AB
Specific Gravity, Urine: 1.015
Urobilinogen, Ur: 0.2
pH, UA: 6 (ref 5.0–8.0)

## 2022-11-10 NOTE — Patient Instructions (Signed)
Try higher table, rest arm on table. See if this makes eating easier with your shoulder pain.  Try and avoid dairy products for the next week. You can use non-dairy milk, or consider using a lactaid tablet prior to dairy if you aren't able to avoid it entirely. BRAT diet--bananas, rice, applesauce and toast  Generally we don't use imodium preventatively, as this may cause significant constipation. We also don't know if your diarrhea is better, so can't tell if the diarrhea is related to the medication or not. Take imodium only with/after loose stools. Try and eat a high fiber diet.  Schedule your cardiology visit (due in August, likely need echo beforehand).  Consider cuting your dose of Carvedilol in half (current tablet is 6.25mg ), continue to take it twice daily, if your home BP's are also low. Monitor your blood pressures at home. Follow up with Dr. Kirke Corin sooner than August if blood pressure is <110/70 even with the drop in dose. If blood pressure goes very high, then increase back to the full tablet.

## 2022-11-11 ENCOUNTER — Other Ambulatory Visit: Payer: Self-pay | Admitting: *Deleted

## 2022-11-11 DIAGNOSIS — D649 Anemia, unspecified: Secondary | ICD-10-CM

## 2022-11-11 LAB — CBC WITH DIFFERENTIAL/PLATELET
Basophils Absolute: 0 10*3/uL (ref 0.0–0.2)
Basos: 0 %
EOS (ABSOLUTE): 0.1 10*3/uL (ref 0.0–0.4)
Eos: 1 %
Hematocrit: 31 % — ABNORMAL LOW (ref 37.5–51.0)
Hemoglobin: 10 g/dL — ABNORMAL LOW (ref 13.0–17.7)
Immature Grans (Abs): 0 10*3/uL (ref 0.0–0.1)
Immature Granulocytes: 0 %
Lymphocytes Absolute: 1.6 10*3/uL (ref 0.7–3.1)
Lymphs: 22 %
MCH: 31.1 pg (ref 26.6–33.0)
MCHC: 32.3 g/dL (ref 31.5–35.7)
MCV: 96 fL (ref 79–97)
Monocytes Absolute: 0.8 10*3/uL (ref 0.1–0.9)
Monocytes: 10 %
Neutrophils Absolute: 4.9 10*3/uL (ref 1.4–7.0)
Neutrophils: 67 %
Platelets: 373 10*3/uL (ref 150–450)
RBC: 3.22 x10E6/uL — ABNORMAL LOW (ref 4.14–5.80)
RDW: 13.6 % (ref 11.6–15.4)
WBC: 7.4 10*3/uL (ref 3.4–10.8)

## 2022-11-11 LAB — BASIC METABOLIC PANEL
BUN/Creatinine Ratio: 27 — ABNORMAL HIGH (ref 10–24)
BUN: 31 mg/dL — ABNORMAL HIGH (ref 8–27)
CO2: 26 mmol/L (ref 20–29)
Calcium: 9.4 mg/dL (ref 8.6–10.2)
Chloride: 102 mmol/L (ref 96–106)
Creatinine, Ser: 1.16 mg/dL (ref 0.76–1.27)
Glucose: 104 mg/dL — ABNORMAL HIGH (ref 70–99)
Potassium: 5.4 mmol/L — ABNORMAL HIGH (ref 3.5–5.2)
Sodium: 140 mmol/L (ref 134–144)
eGFR: 61 mL/min/{1.73_m2} (ref >59)

## 2022-11-11 LAB — TSH: TSH: 2.23 u[IU]/mL (ref 0.450–4.500)

## 2022-11-11 LAB — FERRITIN: Ferritin: 88 ng/mL (ref 30–400)

## 2022-11-12 ENCOUNTER — Other Ambulatory Visit: Payer: Self-pay | Admitting: Cardiovascular Disease

## 2022-11-12 ENCOUNTER — Other Ambulatory Visit: Payer: Self-pay | Admitting: Physician Assistant

## 2022-11-12 ENCOUNTER — Other Ambulatory Visit: Payer: Self-pay

## 2022-11-12 ENCOUNTER — Emergency Department
Admission: EM | Admit: 2022-11-12 | Discharge: 2022-11-12 | Disposition: A | Discharge status: Home / Self Care | Payer: Medicare Other | Attending: Student in an Organized Health Care Education/Training Program | Admitting: Student in an Organized Health Care Education/Training Program

## 2022-11-12 DIAGNOSIS — I4510 Unspecified right bundle-branch block: Secondary | ICD-10-CM | POA: Insufficient documentation

## 2022-11-12 DIAGNOSIS — R531 Weakness: Secondary | ICD-10-CM | POA: Insufficient documentation

## 2022-11-12 DIAGNOSIS — D649 Anemia, unspecified: Secondary | ICD-10-CM | POA: Diagnosis not present

## 2022-11-12 DIAGNOSIS — I110 Hypertensive heart disease with heart failure: Secondary | ICD-10-CM | POA: Diagnosis not present

## 2022-11-12 DIAGNOSIS — E1129 Type 2 diabetes mellitus with other diabetic kidney complication: Secondary | ICD-10-CM | POA: Diagnosis not present

## 2022-11-12 DIAGNOSIS — G40209 Localization-related (focal) (partial) symptomatic epilepsy and epileptic syndromes with complex partial seizures, not intractable, without status epilepticus: Secondary | ICD-10-CM | POA: Diagnosis not present

## 2022-11-12 DIAGNOSIS — I5042 Chronic combined systolic (congestive) and diastolic (congestive) heart failure: Secondary | ICD-10-CM | POA: Diagnosis not present

## 2022-11-12 DIAGNOSIS — G20A1 Parkinson's disease without dyskinesia, without mention of fluctuations: Secondary | ICD-10-CM | POA: Diagnosis not present

## 2022-11-12 DIAGNOSIS — Z7901 Long term (current) use of anticoagulants: Secondary | ICD-10-CM | POA: Insufficient documentation

## 2022-11-12 DIAGNOSIS — R55 Syncope and collapse: Secondary | ICD-10-CM | POA: Diagnosis not present

## 2022-11-12 DIAGNOSIS — I350 Nonrheumatic aortic (valve) stenosis: Secondary | ICD-10-CM | POA: Diagnosis not present

## 2022-11-12 DIAGNOSIS — I959 Hypotension, unspecified: Secondary | ICD-10-CM | POA: Diagnosis not present

## 2022-11-12 DIAGNOSIS — T679XXA Effect of heat and light, unspecified, initial encounter: Secondary | ICD-10-CM | POA: Diagnosis not present

## 2022-11-12 LAB — URINALYSIS, W/ REFLEX TO CULTURE (INFECTION SUSPECTED)
Bilirubin Urine: NEGATIVE
Glucose, UA: NEGATIVE mg/dL
Hgb urine dipstick: NEGATIVE
Ketones, ur: NEGATIVE mg/dL
Nitrite: NEGATIVE
Protein, ur: NEGATIVE mg/dL
Specific Gravity, Urine: 1.008 (ref 1.005–1.030)
WBC, UA: >50 WBC/hpf (ref 0–5)
pH: 6 (ref 5.0–8.0)

## 2022-11-12 LAB — COMPREHENSIVE METABOLIC PANEL
ALT: 8 U/L (ref 0–44)
AST: 14 U/L — ABNORMAL LOW (ref 15–41)
Albumin: 3.3 g/dL — ABNORMAL LOW (ref 3.5–5.0)
Alkaline Phosphatase: 38 U/L (ref 38–126)
Anion gap: 7 (ref 5–15)
BUN: 27 mg/dL — ABNORMAL HIGH (ref 8–23)
CO2: 24 mmol/L (ref 22–32)
Calcium: 7.8 mg/dL — ABNORMAL LOW (ref 8.9–10.3)
Chloride: 108 mmol/L (ref 98–111)
Creatinine, Ser: 1.3 mg/dL — ABNORMAL HIGH (ref 0.61–1.24)
GFR, Estimated: 53 mL/min — ABNORMAL LOW (ref >60)
Glucose, Bld: 119 mg/dL — ABNORMAL HIGH (ref 70–99)
Potassium: 4.3 mmol/L (ref 3.5–5.1)
Sodium: 139 mmol/L (ref 135–145)
Total Bilirubin: 0.7 mg/dL (ref 0.3–1.2)
Total Protein: 5.7 g/dL — ABNORMAL LOW (ref 6.5–8.1)

## 2022-11-12 LAB — CBC WITH DIFFERENTIAL/PLATELET
Abs Immature Granulocytes: 0.02 10*3/uL (ref 0.00–0.07)
Basophils Absolute: 0.1 10*3/uL (ref 0.0–0.1)
Basophils Relative: 1 %
Eosinophils Absolute: 0.1 10*3/uL (ref 0.0–0.5)
Eosinophils Relative: 1 %
HCT: 27.7 % — ABNORMAL LOW (ref 39.0–52.0)
Hemoglobin: 8.5 g/dL — ABNORMAL LOW (ref 13.0–17.0)
Immature Granulocytes: 0 %
Lymphocytes Relative: 24 %
Lymphs Abs: 1.7 10*3/uL (ref 0.7–4.0)
MCH: 31.6 pg (ref 26.0–34.0)
MCHC: 30.7 g/dL (ref 30.0–36.0)
MCV: 103 fL — ABNORMAL HIGH (ref 80.0–100.0)
Monocytes Absolute: 0.9 10*3/uL (ref 0.1–1.0)
Monocytes Relative: 13 %
Neutro Abs: 4.1 10*3/uL (ref 1.7–7.7)
Neutrophils Relative %: 61 %
Platelets: 268 10*3/uL (ref 150–400)
RBC: 2.69 MIL/uL — ABNORMAL LOW (ref 4.22–5.81)
RDW: 14.6 % (ref 11.5–15.5)
WBC: 6.8 10*3/uL (ref 4.0–10.5)
nRBC: 0 % (ref 0.0–0.2)

## 2022-11-12 LAB — HEMOGLOBIN AND HEMATOCRIT, BLOOD
HCT: 30 % — ABNORMAL LOW (ref 39.0–52.0)
Hemoglobin: 9.3 g/dL — ABNORMAL LOW (ref 13.0–17.0)

## 2022-11-12 LAB — URINE CULTURE

## 2022-11-12 LAB — PROTIME-INR
INR: 1.3 — ABNORMAL HIGH (ref 0.8–1.2)
Prothrombin Time: 16.2 seconds — ABNORMAL HIGH (ref 11.4–15.2)

## 2022-11-12 LAB — LACTIC ACID, PLASMA
Lactic Acid, Venous: 1.5 mmol/L (ref 0.5–1.9)
Lactic Acid, Venous: 1.1 mmol/L (ref 0.5–1.9)

## 2022-11-12 LAB — TROPONIN I (HIGH SENSITIVITY)
Troponin I (High Sensitivity): 14 ng/L (ref <18)
Troponin I (High Sensitivity): 15 ng/L (ref <18)

## 2022-11-12 MED ORDER — CEPHALEXIN 500 MG PO CAPS: 500.0000 mg | ORAL_CAPSULE | Freq: Two times a day (BID) | ORAL | 0 refills | Status: AC

## 2022-11-12 MED ORDER — SODIUM CHLORIDE 0.9 % IV BOLUS
500.0000 mL | Freq: Once | INTRAVENOUS | Status: AC
  Administered 2022-11-12: 500 mL via INTRAVENOUS

## 2022-11-12 NOTE — ED Triage Notes (Signed)
Patient comes in via [facility] from home with complaints of weakness. According to EMS, the patient was sitting outside for about 30 minutes when he started feeling weak. Pt had a recent fall a couple weeks ago, and hit his head. Patient is currently taking Eloquis as a result. Pt is alert and oriented x4, according to ems, patient does have a history of fainting. Patient with no signs of acute distress at this time.

## 2022-11-12 NOTE — ED Provider Notes (Signed)
[facility] Provider Note    Event Date/Time   First MD Initiated Contact with Patient 11/12/22 1501     (approximate)   History   Weakness   HPI  ILLIAM [name] is a 89 y.o. male   history of Parkinson's as well as A-fib on anticoagulation with recent fall 2 weeks ago with head injury and scalp laceration presents to the ER for near syncopal event that occurred after he was sitting outside under an awning in the heat today.  Felt very weak and was needing assistance getting back into the house.  Denies any pain.  No melena no hematochezia.  No nosebleed.  No vomiting no chest pain or shortness of breath.  No interval falls since the previous imaging.      Physical Exam   Triage Vital Signs: ED Triage Vitals  Enc Vitals Group     BP 11/12/22 1429 125/64     Pulse Rate 11/12/22 1429 67     Resp 11/12/22 1429 17     Temp 11/12/22 1429 (!) 97.4 F (36.3 C)     Temp Source 11/12/22 1429 Oral     SpO2 11/12/22 1426 96 %     Weight --      Height --      Head Circumference --      Peak Flow --      Pain Score 11/12/22 1430 0     Pain Loc --      Pain Edu? --      Excl. in GC? --     Most recent vital signs: Vitals:   11/12/22 1615 11/12/22 1630  BP:  (!) 161/75  Pulse: 81 (!) 50  Resp: 17   Temp:    SpO2: 100% 100%     Constitutional: Alert  Eyes: Conjunctivae are normal.  Head: Atraumatic. Nose: No congestion/rhinnorhea. Mouth/Throat: Mucous membranes are moist.   Neck: Painless ROM.  Cardiovascular:   Good peripheral circulation. Respiratory: Normal respiratory effort.  No retractions.  Gastrointestinal: Soft and nontender.  Musculoskeletal:  no deformity Neurologic:  MAE spontaneously. No gross focal neurologic deficits are appreciated.  Skin:  Skin is warm, dry and intact. No rash noted. Psychiatric: Mood and affect are normal. Speech and behavior are normal.    ED Results / Procedures / Treatments   Labs (all labs ordered  are listed, but only abnormal results are displayed) Labs Reviewed  CBC WITH DIFFERENTIAL/PLATELET - Abnormal; Notable for the following components:      Result Value   RBC 2.69 (*)    Hemoglobin 8.5 (*)    HCT 27.7 (*)    MCV 103.0 (*)    All other components within normal limits  COMPREHENSIVE METABOLIC PANEL - Abnormal; Notable for the following components:   Glucose, Bld 119 (*)    BUN 27 (*)    Creatinine, Ser 1.30 (*)    Calcium 7.8 (*)    Total Protein 5.7 (*)    Albumin 3.3 (*)    AST 14 (*)    GFR, Estimated 53 (*)    All other components within normal limits  PROTIME-INR - Abnormal; Notable for the following components:   Prothrombin Time 16.2 (*)    INR 1.3 (*)    All other components within normal limits  URINALYSIS, W/ REFLEX TO CULTURE (INFECTION SUSPECTED) - Abnormal; Notable for the following components:   Color, Urine YELLOW (*)    APPearance HAZY (*)    Leukocytes,Ua  MODERATE (*)    Bacteria, UA RARE (*)    All other components within normal limits  HEMOGLOBIN AND HEMATOCRIT, BLOOD - Abnormal; Notable for the following components:   Hemoglobin 9.3 (*)    HCT 30.0 (*)    All other components within normal limits  URINE CULTURE  LACTIC ACID, PLASMA  LACTIC ACID, PLASMA  TROPONIN I (HIGH SENSITIVITY)  TROPONIN I (HIGH SENSITIVITY)     EKG  ED ECG REPORT I, Willy Eddy, the attending physician, personally viewed and interpreted this ECG.   Date: 11/12/2022  EKG Time: 14:30  Rate: 65  Rhythm: sinus  Axis: normal  Intervals:rbbb  ST&T Change: no stemi, no depressions    RADIOLOGY Please see ED Course for my review and interpretation.  I personally reviewed all radiographic images ordered to evaluate for the above acute complaints and reviewed radiology reports and findings.  These findings were personally discussed with the patient.  Please see medical record for radiology report.    PROCEDURES:  Critical Care performed:  No  Procedures   MEDICATIONS ORDERED IN ED: Medications  sodium chloride 0.9 % bolus 500 mL (500 mLs Intravenous Bolus from Bag 11/12/22 1442)     IMPRESSION / MDM / ASSESSMENT AND PLAN / ED COURSE  I reviewed the triage vital signs and the nursing notes.                              Differential diagnosis includes, but is not limited to, Dehydration, sepsis, pna, uti, hypoglycemia, drug effect   Patient presenting to the ER for evaluation of symptoms as described above.  Based on symptoms, risk factors and considered above differential, this presenting complaint could reflect a potentially life-threatening illness therefore the patient will be placed on continuous pulse oximetry and telemetry for monitoring.  Laboratory evaluation will be sent to evaluate for the above complaints.       Clinical Course as of 11/12/22 1752  Fri Nov 12, 2022  1716 Stool guaiac is brown nonmelanotic.  Repeat hemoglobin almost a full gram higher.  Question whether this is delusional effect.  Lower suspicion for acute GI bleeding.  Particular in the setting of stable BUN. [PR]  1745 Had extensive discussion with patient at bedside.  Does have some bacteria in his urine possible UTI.  We discussed option for admission to hospital for additional monitoring.  He is not having signs of acute bleeding.  I do suspect his hemoglobin drop secondary to recent fall and scalp hematoma 2 weeks ago as there is no other explanation today.  Patient states he would prefer outpatient follow-up states he feels well feels like he got overheated may be a little dehydrated but felt improved after IV fluids.  I think that close outpatient follow-up is reasonable has a very reliable care and support at home. [PR]    Clinical Course User Index [PR] Willy Eddy, MD     FINAL CLINICAL IMPRESSION(S) / ED DIAGNOSES   Final diagnoses:  Weakness  Anemia, unspecified type     Rx / DC Orders   ED Discharge Orders           Ordered    cephALEXin (KEFLEX) 500 MG capsule  2 times daily        11/12/22 1745             Note:  This document was prepared using Dragon voice recognition software and may include unintentional  dictation errors.    Willy Eddy, MD 11/12/22 [id number]

## 2022-11-13 LAB — URINE CULTURE

## 2022-11-14 LAB — URINE CULTURE: Culture: >=100000 — AB

## 2022-11-15 ENCOUNTER — Other Ambulatory Visit: Payer: Self-pay | Admitting: *Deleted

## 2022-11-15 ENCOUNTER — Telehealth: Payer: Self-pay | Admitting: Family Medicine

## 2022-11-15 DIAGNOSIS — I5042 Chronic combined systolic (congestive) and diastolic (congestive) heart failure: Secondary | ICD-10-CM | POA: Diagnosis not present

## 2022-11-15 DIAGNOSIS — G20A1 Parkinson's disease without dyskinesia, without mention of fluctuations: Secondary | ICD-10-CM | POA: Diagnosis not present

## 2022-11-15 DIAGNOSIS — I110 Hypertensive heart disease with heart failure: Secondary | ICD-10-CM | POA: Diagnosis not present

## 2022-11-15 DIAGNOSIS — I350 Nonrheumatic aortic (valve) stenosis: Secondary | ICD-10-CM | POA: Diagnosis not present

## 2022-11-15 DIAGNOSIS — T17928A Food in respiratory tract, part unspecified causing other injury, initial encounter: Secondary | ICD-10-CM

## 2022-11-15 DIAGNOSIS — G40209 Localization-related (focal) (partial) symptomatic epilepsy and epileptic syndromes with complex partial seizures, not intractable, without status epilepticus: Secondary | ICD-10-CM | POA: Diagnosis not present

## 2022-11-15 DIAGNOSIS — E1129 Type 2 diabetes mellitus with other diabetic kidney complication: Secondary | ICD-10-CM | POA: Diagnosis not present
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
## 2022-11-15 NOTE — Telephone Encounter (Signed)
Spoke with [facility] and he is showing signs of aspiration. Prolonged coughing fits when he eats and drinks related to Parkinson's. (If you decide to order can we please put Tracy's contact info to be called for scheduling in order [phone]) thanks.

## 2022-11-15 NOTE — Telephone Encounter (Signed)
I LMOVM.  Need to know more detail about what her concerns are (as we have to place an order for this, right?)  FYI he went to ER over the weekend.

## 2022-11-15 NOTE — Telephone Encounter (Signed)
Called and relayed msg to [place] at [employer] and they voiced gratitude and understanding

## 2022-11-15 NOTE — Telephone Encounter (Signed)
[facility] called Olegario Messier) speech Therapy is requesting a modified barium swallow to be done at [facility]  She can be reached at [phone]

## 2022-11-15 NOTE — Telephone Encounter (Signed)
OK for the order. (Hopefully the patient will agree!)

## 2022-11-15 NOTE — Telephone Encounter (Signed)
I would recommend he go to the ER for evaluation to report of new symptoms. He needs further evaluation.

## 2022-11-15 NOTE — Telephone Encounter (Signed)
Ordered and I let Olegario Messier know.

## 2022-11-15 NOTE — Telephone Encounter (Signed)
Call back to [place] at [facility]. She states that she was with the patient this morning during seizure activity and describes it as patient "staring off and not responding and then snaps out of it". she also states wife says he fell 3x last week during those episodes. Advised I will follow up with wife as well. Olegario Messier appreciative of call.   Call to wife, she states that patient did have the falls with head strike but had the CT and was negative for bleed or fracture. She also stated the had blood work done. She asked patient if he had missed any medication doses and she stated " he didn't know". Encouraged wife to assist in medication administration and using a pill box. Wife also confirms that patient has denied any SI/HI but has been more aggressive towards her. Advised I would send to provider for further advise and recommendations. Wife appreciative of call

## 2022-11-16 ENCOUNTER — Other Ambulatory Visit: Payer: Self-pay | Admitting: *Deleted

## 2022-11-16 ENCOUNTER — Telehealth: Payer: Self-pay

## 2022-11-16 ENCOUNTER — Telehealth: Payer: Self-pay | Admitting: Family Medicine

## 2022-11-16 ENCOUNTER — Other Ambulatory Visit: Payer: Self-pay | Admitting: Family Medicine

## 2022-11-16 DIAGNOSIS — T17928A Food in respiratory tract, part unspecified causing other injury, initial encounter: Secondary | ICD-10-CM

## 2022-11-16 DIAGNOSIS — G40209 Localization-related (focal) (partial) symptomatic epilepsy and epileptic syndromes with complex partial seizures, not intractable, without status epilepticus: Secondary | ICD-10-CM | POA: Diagnosis not present

## 2022-11-16 DIAGNOSIS — R1310 Dysphagia, unspecified: Secondary | ICD-10-CM

## 2022-11-16 DIAGNOSIS — E1129 Type 2 diabetes mellitus with other diabetic kidney complication: Secondary | ICD-10-CM | POA: Diagnosis not present

## 2022-11-16 DIAGNOSIS — I350 Nonrheumatic aortic (valve) stenosis: Secondary | ICD-10-CM | POA: Diagnosis not present

## 2022-11-16 DIAGNOSIS — G20A1 Parkinson's disease without dyskinesia, without mention of fluctuations: Secondary | ICD-10-CM | POA: Diagnosis not present

## 2022-11-16 DIAGNOSIS — I110 Hypertensive heart disease with heart failure: Secondary | ICD-10-CM | POA: Diagnosis not present

## 2022-11-16 DIAGNOSIS — D649 Anemia, unspecified: Secondary | ICD-10-CM

## 2022-11-16 DIAGNOSIS — I5042 Chronic combined systolic (congestive) and diastolic (congestive) heart failure: Secondary | ICD-10-CM | POA: Diagnosis not present

## 2022-11-16 DIAGNOSIS — R633 Feeding difficulties, unspecified: Secondary | ICD-10-CM

## 2022-11-16 NOTE — Telephone Encounter (Signed)
Pt daughter called back and states that it is [facility]   [address] [place], [place], [place] [address]

## 2022-11-16 NOTE — Telephone Encounter (Signed)
Ok to order a CBC (not sure how to do it the way she is asking) for him to have it repeated. Verify that he is taking B12 supplement. If not, also check B12 level.  If he is taking B12, no need to check it again.  Dx anemia unspecified

## 2022-11-16 NOTE — Transitions of Care (Post Inpatient/ED Visit) (Unsigned)
11/16/2022  Name: Jeffrey [name] MRN: [id number] DOB: 03/30/1933  Today's TOC FU Call Status: Today's TOC FU Call Status:: Successful TOC FU Call Competed TOC FU Call Complete Date: 11/16/22  Transition Care Management Follow-up Telephone Call Date of Discharge: 11/12/22 Discharge Facility: [facility]) Type of Discharge: Emergency Department Reason for ED Visit: Other: How have you been since you were released from the hospital?: Same Any questions or concerns?: No  Items Reviewed: Did you receive and understand the discharge instructions provided?: Yes Medications obtained,verified, and reconciled?: Yes (Medications Reviewed) Any new allergies since your discharge?: No Dietary orders reviewed?: NA Do you have support at home?: Yes People in Home: child(ren), dependent, spouse  Medications Reviewed Today: Medications Reviewed Today     Reviewed by Joselyn Arrow, MD (Physician) on 11/10/22 at 1112  Med List Status: <None>   Medication Order Taking? Sig Documenting Provider Last Dose Status Informant  acetaminophen (TYLENOL) 500 MG tablet [id number] Yes Take 500 mg by mouth 2 (two) times daily. [provider] Taking Active            Med Note Katrinka Blazing, Corliss Blacker Dec 16, 2021 11:07 AM) Leanora Ivanoff 2 tablets BID  apixaban (ELIQUIS) 2.5 MG TABS tablet [id number] Yes Take 1 tablet (2.5 mg total) by mouth 2 (two) times daily. Iran Ouch, MD Taking Active   atorvastatin (LIPITOR) 20 MG tablet [id number] Yes Take 1 tablet (20 mg total) by mouth daily. Sondra Barges, PA-C Taking Active   carbidopa-levodopa (SINEMET IR) 25-100 MG tablet [id number] No Take 1 tablet by mouth 3 (three) times daily.  Patient not taking: Reported on 11/10/2022   Glean Salvo, NP Not Taking Active            Med Note Colon Branch Nov 10, 2022 10:54 AM) Stopped taking 5-6 days due to severe diarrhea  carvedilol (COREG) 6.25 MG tablet [id number] Yes TAKE ONE TABLET  TWICE DAILY WITH A MEAL Dunn, Ryan M, PA-C Taking Active   Cyanocobalamin (VITAMIN B 12 PO) [id number] Yes Take 1,000 mcg by mouth. [provider] Taking Active   ipratropium (ATROVENT) 0.03 % nasal spray [id number] No Place 2 sprays into both nostrils 3 (three) times daily as needed for rhinitis.  Patient not taking: Reported on 11/10/2022   Joselyn Arrow, MD Not Taking Active            Med Note Katrinka Blazing, Corliss Blacker Nov 10, 2022 10:56 AM) Few times a week  levETIRAcetam (KEPPRA XR) 750 MG 24 hr tablet [id number] Yes Take 1 tablet (750 mg total) by mouth at bedtime. Ihor Austin, NP Taking Active   loperamide (IMODIUM) 2 MG capsule [id number] Yes Take 2 mg by mouth as needed for diarrhea or loose stools. [provider] Taking Active            Med Note Colon Branch Nov 10, 2022 10:57 AM) Last dose Monday  mirabegron ER (MYRBETRIQ) 50 MG TB24 tablet [id number] Yes Take 1 tablet (50 mg total) by mouth daily. Harle Battiest, PA-C Taking Active   Multiple Vitamins-Minerals (PRESERVISION AREDS) CAPS [id number] Yes Take 1 capsule by mouth 2 (two) times daily.  [provider] Taking Active Self  UNABLE TO FIND [id number] Yes Bevacizumab(AVASTIN) chemo injection for the eye every 10 weeks [provider] Taking Active Self           Med Note Katrinka Blazing,  Roma Schanz   Wed Nov 10, 2022 10:55 AM) Having tomorrow            Home Care and Equipment/Supplies: Were Home Health Services Ordered?: No Any new equipment or medical supplies ordered?: No  Functional Questionnaire:    Follow up appointments reviewed: PCP Follow-up appointment confirmed?: No (daughter stated she sent mychart message to Dr. Lynelle Doctor hard to get him in and he already has a f/u with neurology next week) MD Provider Line [phone] Given: Yes Specialist Hospital Follow-up appointment confirmed?: Yes Date of Specialist follow-up appointment?: 11/25/22 Follow-Up Specialty  Provider:: Neurology Do you need transportation to your follow-up appointment?: No Do you understand care options if your condition(s) worsen?: Yes-patient verbalized understanding    SIGNATURE Clyda Hurdle RMA

## 2022-11-16 NOTE — Telephone Encounter (Signed)
Spoke with [place] Ana and she cannot confirm whether or not he is taking B12 supplement. She said she is not sure what is actually taking or not taking. They are working with the pharmacy to have everything put into packs as they believe he is not taking his med/supplements as he has been telling everyone. I will order B12 and cbc. Adolph Pollack said to order future and release and the [facility] should be able to see as any other [facility]. Just wanted to let you know, thanks.

## 2022-11-19 DIAGNOSIS — I5042 Chronic combined systolic (congestive) and diastolic (congestive) heart failure: Secondary | ICD-10-CM | POA: Diagnosis not present

## 2022-11-19 DIAGNOSIS — I110 Hypertensive heart disease with heart failure: Secondary | ICD-10-CM | POA: Diagnosis not present

## 2022-11-19 DIAGNOSIS — G20A1 Parkinson's disease without dyskinesia, without mention of fluctuations: Secondary | ICD-10-CM | POA: Diagnosis not present

## 2022-11-19 DIAGNOSIS — I350 Nonrheumatic aortic (valve) stenosis: Secondary | ICD-10-CM | POA: Diagnosis not present

## 2022-11-19 DIAGNOSIS — E1129 Type 2 diabetes mellitus with other diabetic kidney complication: Secondary | ICD-10-CM | POA: Diagnosis not present

## 2022-11-19 DIAGNOSIS — G40209 Localization-related (focal) (partial) symptomatic epilepsy and epileptic syndromes with complex partial seizures, not intractable, without status epilepticus: Secondary | ICD-10-CM | POA: Diagnosis not present

## 2022-11-21 DIAGNOSIS — D649 Anemia, unspecified: Secondary | ICD-10-CM | POA: Diagnosis not present

## 2022-11-22 ENCOUNTER — Ambulatory Visit
Admission: RE | Admit: 2022-11-22 | Discharge: 2022-11-22 | Disposition: A | Discharge status: Home / Self Care | Payer: Medicare Other | Source: Ambulatory Visit | Attending: Family Medicine | Admitting: Family Medicine

## 2022-11-22 DIAGNOSIS — Z8546 Personal history of malignant neoplasm of prostate: Secondary | ICD-10-CM | POA: Diagnosis not present

## 2022-11-22 DIAGNOSIS — I4891 Unspecified atrial fibrillation: Secondary | ICD-10-CM | POA: Insufficient documentation

## 2022-11-22 DIAGNOSIS — Z9089 Acquired absence of other organs: Secondary | ICD-10-CM | POA: Insufficient documentation

## 2022-11-22 DIAGNOSIS — I69391 Dysphagia following cerebral infarction: Secondary | ICD-10-CM | POA: Diagnosis not present

## 2022-11-22 DIAGNOSIS — G4733 Obstructive sleep apnea (adult) (pediatric): Secondary | ICD-10-CM | POA: Diagnosis not present

## 2022-11-22 DIAGNOSIS — R1312 Dysphagia, oropharyngeal phase: Secondary | ICD-10-CM | POA: Diagnosis not present

## 2022-11-22 DIAGNOSIS — R531 Weakness: Secondary | ICD-10-CM | POA: Diagnosis not present

## 2022-11-22 DIAGNOSIS — E119 Type 2 diabetes mellitus without complications: Secondary | ICD-10-CM | POA: Insufficient documentation

## 2022-11-22 DIAGNOSIS — W44F3XA Food entering into or through a natural orifice, initial encounter: Secondary | ICD-10-CM | POA: Diagnosis not present

## 2022-11-22 DIAGNOSIS — I5042 Chronic combined systolic (congestive) and diastolic (congestive) heart failure: Secondary | ICD-10-CM | POA: Diagnosis not present

## 2022-11-22 DIAGNOSIS — T17928A Food in respiratory tract, part unspecified causing other injury, initial encounter: Secondary | ICD-10-CM | POA: Diagnosis not present

## 2022-11-22 DIAGNOSIS — I6782 Cerebral ischemia: Secondary | ICD-10-CM | POA: Diagnosis not present

## 2022-11-22 DIAGNOSIS — I110 Hypertensive heart disease with heart failure: Secondary | ICD-10-CM | POA: Insufficient documentation

## 2022-11-22 DIAGNOSIS — R1310 Dysphagia, unspecified: Secondary | ICD-10-CM | POA: Diagnosis not present

## 2022-11-22 DIAGNOSIS — R633 Feeding difficulties, unspecified: Secondary | ICD-10-CM | POA: Diagnosis not present

## 2022-11-22 DIAGNOSIS — E7800 Pure hypercholesterolemia, unspecified: Secondary | ICD-10-CM | POA: Diagnosis not present

## 2022-11-22 DIAGNOSIS — G20A1 Parkinson's disease without dyskinesia, without mention of fluctuations: Secondary | ICD-10-CM | POA: Insufficient documentation

## 2022-11-22 DIAGNOSIS — G40909 Epilepsy, unspecified, not intractable, without status epilepticus: Secondary | ICD-10-CM | POA: Diagnosis not present

## 2022-11-22 NOTE — Procedures (Signed)
Modified Barium Swallow Study  Patient Details  Name: Jeffrey [name] MRN: [id number] Date of Birth: 01-31-1933  Today's Date: 11/22/2022  Modified Barium Swallow completed.  Full report located under Chart Review in the Imaging Section.  History of Present Illness Pt is a 89 year old male who was referred by his PCP Jeffrey Arrow, MD) to family report of coughing during PO consumption. Pt currently has HHST Jeffrey [name]). Pt with history of Parkinson's Disease and frequent falls. Recent Head CT on 11/04/2022 revealed   1. No acute intracranial abnormality. 2. Moderate generalized cerebral atrophy and chronic microvascular ischemic changes of the white matter. 3. Left parietal scalp hematoma without evidence of underlying  fracture.   Clinical Impression Pt presents with increased aspiration risk d/t moderate oropharyngeal dysphagia that appears sensorimotor in nature. When consuming thin liquids via spoon and cup; nectar thick liquids via spoon and cup; puree; and graham cracker with barium paste, pt's oral phase is c/b weak lingual maniuplation resulting in decreased oral containment and mild to moderate widespread oral residue. Pt with minimal mastication which resulted in large pieces of graham cracker being swallowed whole.    Pt's pharyngeal swallow appears weak and delayed.  Pt's swallow is delayed with initiation at the pyrform sinuses. Pt presents with decreased base of tongue strength as well as decreased hyolaryngeal excursion and reduced pharyngeal peristalsis which results in widespread pharyngeal residue. Pt's pharyngeal impairments result in GROSS SILENT ASPIRATION of thin liquids via spoon, single sips and consecutive sips via cup.  Pt didn't exhibit any coughing during this study.  At this time, recommend minced diet with nectar thick liquids (cup or straw), medicine whole with nectar thick liquids while following STRICT ASPIRATION precautions.        This Clinical research associate met with pt, his wife and  their 2 daughters after the study and conveyed the results of the study. Explained concern for silent aspiration of any thin liquids, made them aware of current diet recommendations including where to purchase thickener, foods to avoid (all mixed consistencies, ice cream, jello, milkshakes, raw food, grapes, hotdogs). Further explanation was provided on mincing foods. Written information provided but pt and his family would likely benefit from further education to be completed by their HHST. Will send HHST and pt's neurologist copy of findings.   Factors that may increase risk of adverse event in presence of aspiration Rubye Oaks & Clearance Coots 2021): Reduced cognitive function;Limited mobility;Frail or deconditioned;Inadequate oral hygiene;Weak cough  Swallow Evaluation Recommendations Recommendations: PO diet PO Diet Recommendation: Dysphagia 2 (Finely chopped);Dysphagia 1 (Pureed);Mildly thick liquids (Level 2, nectar thick) Liquid Administration via: Cup;Straw;Spoon Medication Administration: Whole meds with liquid Supervision: Full supervision/cueing for swallowing strategies Swallowing strategies  : Minimize environmental distractions;Slow rate;Small bites/sips Postural changes: Position pt fully upright for meals;Stay upright 30-60 min after meals Oral care recommendations: Oral care BID (2x/day) Caregiver Recommendations: Avoid jello, ice cream, thin soups, popsicles;Remove water pitcher     Zelena Bushong B. Dreama Saa, M.S., CCC-SLP, Tree surgeon Certified Brain Injury Specialist [facility]  [facility] Office [phone] Ascom [phone] Fax [phone]

## 2022-11-25 ENCOUNTER — Encounter: Payer: Self-pay | Admitting: Neurology

## 2022-11-25 ENCOUNTER — Ambulatory Visit (INDEPENDENT_AMBULATORY_CARE_PROVIDER_SITE_OTHER): Payer: Medicare Other | Admitting: Neurology

## 2022-11-25 VITALS — BP 133/67 | HR 60 | Ht 71.0 in | Wt 130.0 lb

## 2022-11-25 DIAGNOSIS — W19XXXD Unspecified fall, subsequent encounter: Secondary | ICD-10-CM

## 2022-11-25 DIAGNOSIS — I5042 Chronic combined systolic (congestive) and diastolic (congestive) heart failure: Secondary | ICD-10-CM | POA: Diagnosis not present

## 2022-11-25 DIAGNOSIS — R441 Visual hallucinations: Secondary | ICD-10-CM | POA: Diagnosis not present

## 2022-11-25 DIAGNOSIS — G214 Vascular parkinsonism: Secondary | ICD-10-CM | POA: Diagnosis not present

## 2022-11-25 DIAGNOSIS — G40909 Epilepsy, unspecified, not intractable, without status epilepticus: Secondary | ICD-10-CM | POA: Diagnosis not present

## 2022-11-25 DIAGNOSIS — E1129 Type 2 diabetes mellitus with other diabetic kidney complication: Secondary | ICD-10-CM | POA: Diagnosis not present

## 2022-11-25 DIAGNOSIS — G20A1 Parkinson's disease without dyskinesia, without mention of fluctuations: Secondary | ICD-10-CM | POA: Diagnosis not present

## 2022-11-25 DIAGNOSIS — I110 Hypertensive heart disease with heart failure: Secondary | ICD-10-CM | POA: Diagnosis not present

## 2022-11-25 DIAGNOSIS — I350 Nonrheumatic aortic (valve) stenosis: Secondary | ICD-10-CM | POA: Diagnosis not present

## 2022-11-25 DIAGNOSIS — G40209 Localization-related (focal) (partial) symptomatic epilepsy and epileptic syndromes with complex partial seizures, not intractable, without status epilepticus: Secondary | ICD-10-CM | POA: Diagnosis not present

## 2022-11-25 LAB — FECAL OCCULT BLOOD, IMMUNOCHEMICAL: Fecal Occult Bld: POSITIVE — AB

## 2022-11-25 MED ORDER — LEVETIRACETAM ER 500 MG PO TB24
500.0000 mg | ORAL_TABLET | Freq: Every day | ORAL | 5 refills | Status: DC
Start: 2022-11-25 — End: 2023-05-12

## 2022-11-25 NOTE — Patient Instructions (Signed)
I had a long the patient, wife and daughter about his recent visual hallucinations and frequent falls which appear to have improved after stopping sinemet and I do not see significant worsening of his parkinsonism features hence I recommend that he stay off it .  He was encouraged to continue ongoing physical and speech therapy to improve his gait, balance and swallowing.  I also recommend we reduce Keppra Xr to 500 mg daily and weight loss.  Return for follow-up in the future in 6 months with Shanda Bumps nurse practitioner or call earlier if needed

## 2022-11-25 NOTE — Progress Notes (Signed)
[facility] [address] [place]. [place] [address] [phone]       OFFICE FOLLOW UP NOTE  Jeffrey [name] Date of Birth:  Mar 24, 1933 Medical Record Number:  [id number]   Referring MD:   Nita Sickle Reason for visit: Seizures [facility] provider: Dr. Pearlean Brownie   Chief Complaint  Patient presents with   Follow-up    Rm 22, here with wife and daughter Here for a follow up, pt states he had a DAT scan done, pt states tremors have not changed. Pt started carbidopa-levodopa but stopped after 4 weeks but had to stop due to having diarrhea.       HPI:   Initial visit 05/02/2018 PS: Jeffrey [name] is a pleasant 89 year old Caucasian male was seen today for initial office consultation visit.  He is accompanied by his wife and daughter.  History is obtained from them and review of referral notes.  I have reviewed imaging films in PACS.  He has been having multiple brief episodes of altered awareness for several years but these appear to have increased in the last 6 months and frequency as well as duration.  He had a prolonged episode recently in November 2019 when he went to the ER.  CT scan of the head was obtained which I reviewed showed no acute abnormality except changes of age-related small vessel disease.  He is recently had a few back-to-back episodes as well.  He is unable to identify specific trigger for his episodes.  He is usually staring during these episodes and does not respond to commands.  He does not lose consciousness fall or hurt himself.  His episodes were previously lasting less than a minute the dose of few recent ones have been more prolonged.  Following some of these episodes he tends to get agitated and disoriented.  The patient's daughter feels she is noticed that he does some automatic hand movements and some of these episodes.  He denies any headache or any aura prior to these episodes.  He has no remote history of head injury with loss of consciousness  intracranial hemorrhage or strokes.  There is no family history of epilepsy or seizures.  There is no specific pattern for these episodes which may occur once every few months to several in a week.  He has not had an EEG or MRI scan done or a trial of seizure medications. He had prior history of right anterior frontal lacunar infarct in 11/27/2014 he was felt to have asymptomatic 80 to 90% left ICA stenosis at that time.  Echocardiogram was normal.  He has remote history of [facility] spotted fever with encephalitis.  He was treated with a 2-week course of doxycycline.  He did not have any definite seizures at that time.  He had episode of brief staring with a blank look on his face in May 2016 and was admitted to [facility] where he had an EEG which was negative for seizures.  He was seen by Dr. Starleen Blue neurologist as an outpatient and had a negative work-up for his seizures as well.  He was referred to Dr. Leonette Most feels vascular surgeon for left carotid stenosis follow-up with ultrasound in the office showed stenosis to be much less and hence conservative follow-up was recommended.  He was seen by me in the office in March 2017 but has not followed up since then.  He was found to subsequently have a transient episode of paroxysmal A. fib and was started on  Eliquis by cardiologist Dr. Kirke Corin.  He also had a second EEG done in September 2016 which was also unremarkable except for mild generalized slowing.  Update 08/07/2018 PS : He returns for follow-up after last visit 3 months ago.  He is accompanied by his wife.  He states he is doing well.  Is tolerating Keppra XR 500 mg quite well without any side effects.  He has had no spells like seizures or speech difficulties since the last visit.  He has had no stroke or TIA symptoms either.  He underwent EEG on 06/12/2018 which was read by me and was normal.  MRI scan of the brain done on 06/14/2018 which I have personally reviewed the images shows  tiny right posterior temporal white matter lacunar infarct which was clinically silent as patient had no symptoms at that time.  Carotid ultrasound previously done on 12/23/2017 had shown 40 to 59% left ICA stenosis no significant stenosis on the right.  Transthoracic echo done on 04/05/2018 and showed normal ejection fraction.  Lipid profile done on 07/05/2018 was unremarkable with LDL cholesterol of 66 mg percent, HDL of 59 and total cholesterol 152 mg percent.  Hemoglobin A1c on 06/28/2018 was 6.0.  Patient has been diagnosed with sleep apnea by his pulmonologist Dr Bard Herbert and has started using CPAP every night recently.  He has no new complaints today.  He states his tremors are unchanged and are not functionally disabling.  He does follow-up with Dr. Darrick Penna from vascular surgery for carotid stenosis and will have follow-up carotid ultrasound done in upcoming visit in the spring  Update 02/20/2019 JM: Jeffrey [name] is a 89 year old male who is being seen today for follow-up accompanied by wife.  He has been doing well from a neurological standpoint.  He continues on Keppra XR 500 mg daily without recurrent seizure episodes or speech difficulties.  He did have a ED visit on 12/29/2018 for syncopal episode which was felt likely due to orthostatic hypotension with cardiology follow-up and medications adjusted.  Syncopal episode was not felt to be related to seizures.  Denies new or recurring stroke/TIA symptoms as well.  Upper extremity L>R tremors have been stable and do not interfere with daily functioning.  He remains on Eliquis without bleeding or bruising.  He endorses ongoing compliance with CPAP for OSA management.  Blood pressure today elevated at 189/90 but does endorse elevation at doctor's appointments.  He does monitor at home and typically 120s/70s.  Denies new or worsening stroke/TIA symptoms.   Update 09/03/2019 Dr. Pearlean Brownie: He returns for follow-up after last visit 6 months ago.  He states he is doing well  but wife states that a few weeks ago she noticed episode of transient unresponsiveness where he was staring ahead and not immediately responsive this lasted only a few moments and then returned back to baseline but patient did not remember that.  There were no automatic movements or lipsmacking noted.  There is no confusion or disorientation or headache.  There is no apparent trigger.  He remains on Keppra Exar 500 mg daily and is tolerating well without side effects.  He remains on Eliquis for his A. fib and has not had any stroke or TIA symptoms.  His blood pressure is significantly elevated today with the patient's wife claims that this on whitecoat hypertension as it is much better at home.  He continues to have intermittent hand tremor but this is not functionally disabling.  He has no other new complaints.  Update 03/05/2020 Dr. Pearlean Brownie: He returns for follow-up after last visit 6 months ago.  Is accompanied by his wife.  Wife feels that these episodes of transient dizziness and unresponsiveness and staring ahead is still occurring but she is not sure how frequently is happening.  She gets this is probably once a month or so.  He has tolerated increasing the dose of Keppra XR 2000 mg daily without side effects.  He continues to have mild tremors in his hands and head but these are not interfering with day-to-day activities and remain unchanged.  Is tolerating Eliquis well without bruising or bleeding.  He had a repeat EEG done on 11/15/2019 which was normal.  He has no new complaints.   Update 09/03/2020 JM: Returns for 36-month follow-up.  Stable since prior visit without any reoccurring seizures or symptoms.  Remains on Keppra XR but per patient report, only taking 1 tablet at night. At prior visit, reported taking 2 tab for 1000mg  dosing but he denies ever taking 2 tablets at night.  He is tolerating current dose of Keppra without side effects.  Chronic mild upper extremity tremor with mild progression but  does not interfer with daily activity. Hx of A. fib on Eliquis routinely monitored by cardiology.  No concerns at this time.   Update 03/09/2021 JM: Returns for 67-month follow-up accompanied by his wife.  ED eval 11/12/2020 for unresponsive episode with eyes open, staring and altered awareness after mowing his grass. Noted to have taken his initial dose of silodosin for his BPH that same day.  Event similar to his prior episodes.   Reports occasional feeling of "swimmy headedness" when stands too quickly and with increased exposure to heat/sunshine. Per wife, he had an additional event (similar to the one in June) with sensation of dizziness/lightheadedness, impairment of consciousness with unresponsive, and staring.  He does not remember these episodes occurring.  Lasting only short duration.  Denies actual loss of consciousness or passing out, falling, tongue biting, weakness, or autonomic movements. Wife and son able to assist pt back into house and after sitting and drinking water, shortly returned back to baseline.  He will occasionally feel lightheadedness not associated with staring or altered consciousness episodes.  He does admit to limited fluid intake drinking approximately 4-8 oz of water daily. Admits to 1-2 beers/day and occasionally a mixed drink (states "it doesn't make my doctor happy but it makes me happy").   Reports compliance on Keppra XR 500 mg nightly - denies any missed dosages.  He was previously advised to increase dose after similar episode occurred in 07/2019 but this never occurred. When seen on 09/03/2020, he denied any additional events in almost 1 year therefore remained on keppra XR 500mg  dosing.   Tremor stable noting improvement since prior visit and can wax/wane.  Does not interfere with daily activity or functioning.  Occasionally can be worse at night - unable to identify trigger.  Recently started on B12 supplement for B12 deficiency.  Reports compliance on Eliquis for  secondary stroke prevention measures and history of A. fib managed by cardiology.  Routinely followed by PCP.  No further concerns at this time.    Update 07/20/2021 JM: 89 year old male with history of complex partial seizures and tremor.  He is accompanied by his wife. Stable from seizure standpoint, currently taking Keppra XR 750 mg daily without side effects. No additional seizure activity since dosage increase after prior visit.  Continued tremor noting good days/bad days and can be worse  at night. He did have a fall on 2/10 with c/o left shoulder and arm pain - does experience increased shoulder and arm pain at night. Routinely followed by orthopedics receiving bilateral shoulder injections but has not yet been seen since fall. He was evaluated in ED post fall - left shoulder and arm xray which was negative for fracture, CTH unremarkable, CT cervical no acute findings. Otherwise tremors relatively stable, does not interfere with daily activity or functioning. He also mentions over the past 6 months, experiencing sensation of a sock still on his right foot despite being barefoot typically more around his toes. Denies any worsening since onset and denies any painful symptoms.  Denies back pain or traumatic event preceding symptoms. Was started on B12 supplement a couple months ago for B12 deficiency. Hx of prediabetes but otherwise no history of neuropathy or family history of neuropathy.  Of note, blood pressure elevated today but routinely monitors at home and typically stable.  No further concerns at this time.  Update 02/03/2022 JM: Patient returns for 68-month follow-up accompanied by his wife and daughter  remains on Keppra XR 750 mg daily, denies side effects. Wife reports where he may not be verbally responsive for a few seconds, seems to happen more consistently when standing after sitting for prolonged period of time.  Denies any changes in his speech during these episodes.  He has been having  issues with low blood pressure and syncopal events, blood pressure is not checked during these times.  He was seen in the ED 8/12 after syncopal event which was felt to be heat related with possible orthostatic syncope.  He has not yet had follow-up with cardiology since his event as he was seen by them 8/11.  Admits to limited water intake (<4oz of water/day). Daughter reports weight loss and questions if this could be contributing to low blood pressure.  Does drink Ensure during the day.  Reports continued right foot toe numbness/discomfort, can feel like he is still wearing a tight sock when barefoot.  Denies any progression since prior visit.  Remains on B12 supplement.  Feels like tremors have slightly worsened since prior visit but does not interfere with daily activity or functioning  No further concerns at this time    Update 08/10/2022 JM: Patient returns for 5-month follow-up accompanied by his wife.  Compliant on Keppra XR 500 mg daily, no recent seizure activity, denies side effects.   Believes tremors have continued to gradually worsen since prior visit.  Has been more noticeable at rest but also occur with activity, present in both hands and jaw.  Denies tremors in legs. Feels like he isn't as steady as he used to be. Wife does report some acting out in his sleep. Sleeps well for most part but does have frequent nocturia.  Has issues with constipation. Some swallowing issues with harder/chewy food or water. Some cognitive issues.  Routinely follows with ophthalmology for macular degeneration receiving injections every 10 weeks.  Denies any freezing with ambulation or any recent falls.   Update 11/25/2022 : He returns for follow-up after last visit 3 months ago.  He is accompanied by his daughter.  He has discontinued Sinemet as he was having increasing visual hallucinations and falling a lot and he was taking double the dose which was causing side effects.  Despite stopping Sinemet for  now more than 2 weeks he is continues to have visual hallucinations.  He sees lots of people.  This is not distressing.  He notes it is not real.  Patient continues to have mild tremor but it is not disabling and does not want to try medication at this time.  He is currently working with physical and speech therapy to improve his balance and swallowing.  He has not had any seizures and is wondering if his Keppra dose can be reduced since he has recently lost a lot of weight.  He had a scan on 4/24 which showed near absent radiotracer activity within bilateral Put[place] and decreased activity in the head on the right caudate in a pattern suggestive of parkinsonian syndrome     ROS:   14 system review of systems is positive for those listed in HPI denies and all other systems negative  PMH:  Past Medical History:  Diagnosis Date   Acute CVA (cerebrovascular accident) (HCC)    Acute encephalopathy 11/27/2014   Aortic atherosclerosis (HCC) 11/25/2014   Atrial fibrillation (HCC)    a. 12/2018 [facility]: Sinus rhythm, avg rate 71. 2 runs of VT (max 17 beats). 27 runs SVT (max 14 beats). Occas PACs/PVCs. No high grade AV block/pauses.   Chronic combined systolic (congestive) and diastolic (congestive) heart failure (HCC)    a. 10/2014 Echo: EF 40-45%; b. 03/2018 Echo: EF 55-60%; c. 12/2018 Echo: EF 55-60%, mild conc LVH. Nl RV fxn. RVSP 51.65mmHg. Mildly dil LA. Mod AS w/ severe Ca2+; d. 12/2019 Echo: EF 50-55%, mild LVH, gr2 DD. Sev elev PASP. Mild Jeffrey, mild to mod TR. Triv AI, mod to sev AS (area 0.77cm^2, mean grad . Vmax 2.43m/s).   Diabetes mellitus    Hypercholesteremia    Hypertension    Impaired fasting glucose    Macular degeneration    Moderate to severe aortic stenosis    a. 12/2018 Echo: EF 55-60%, Mod AS w/ sev AoV Ca2+; b. 12/2019 Echo: EF 50-55%. Mod to sev AS (area 0.77cm^2, mean grad . Vmax 2.36m/s).   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 01/15/2014    Social History:  Social History   Socioeconomic History   Marital status: Married    Spouse name: Fleet Contras   Number of children: 4   Years of education: Not on file   Highest education level: Not on file  Occupational History   Occupation: Retired  Tobacco Use   Smoking status: Former    Packs/day: 1.00    Years: 20.00    Additional pack years: 0.00    Total pack years: 20.00    Types: Cigarettes    Quit date: 05/31/1980    Years since quitting: 42.5   Smokeless tobacco: Never  Vaping Use   Vaping Use: Never used  Substance and Sexual Activity   Alcohol use: Not Currently    Alcohol/week: 3.0 standard drinks of alcohol    Types: 3 Cans of beer per week    Comment: e beer 3x/week   Drug use: No   Sexual activity: Not Currently    Partners: Female  Comment: issues with ED  Other Topics Concern   Not on file  Social History Narrative   Married. Retired Emergency planning/management officer.  2 sons, 2 daughters (all in [place]), 15+ grandchildren, 9 great grandchildren   Lives with wife, 1 dog.   Right Handed   Drinks 2 cups of caffeine daily      Updated 08/2022   Social Determinants of Health   Financial Resource Strain: Low Risk  (05/07/2021)   Overall Financial Resource Strain (CARDIA)    Difficulty of Paying Living Expenses: Not very hard  Food Insecurity: Not on file  Transportation Needs: No Transportation Needs (05/07/2021)   PRAPARE - Administrator, Civil Service (Medical): No    Lack of  Transportation (Non-Medical): No  Physical Activity: Not on file  Stress: Not on file  Social Connections: Not on file  Intimate Partner Violence: Not on file    Medications:   Current Outpatient Medications on File Prior to Visit  Medication Sig Dispense Refill   acetaminophen (TYLENOL) 500 MG tablet Take 500 mg by mouth 2 (two) times daily.     apixaban (ELIQUIS) 2.5 MG TABS tablet Take 1 tablet (2.5 mg total) by mouth 2 (two) times daily. 60 tablet 11   atorvastatin (LIPITOR) 20 MG tablet TAKE 1 TABLET BY MOUTH ONCE DAILY 90 tablet 3   carvedilol (COREG) 6.25 MG tablet TAKE ONE TABLET BY MOUTH TWICE DAILY 180 tablet 3   Cyanocobalamin (VITAMIN B 12 PO) Take 1,000 mcg by mouth.     ipratropium (ATROVENT) 0.03 % nasal spray Place 2 sprays into both nostrils 3 (three) times daily as needed for rhinitis. 30 mL 12   mirabegron ER (MYRBETRIQ) 50 MG TB24 tablet Take 1 tablet (50 mg total) by mouth daily. 90 tablet 3   Multiple Vitamins-Minerals (PRESERVISION AREDS) CAPS Take 1 capsule by mouth 2 (two) times daily.      UNABLE TO FIND Bevacizumab(AVASTIN) chemo injection for the eye every 10 weeks     carbidopa-levodopa (SINEMET IR) 25-100 MG tablet Take 1 tablet by mouth 3 (three) times daily. (Patient not taking: Reported on 11/10/2022) 90 tablet 5   loperamide (IMODIUM) 2 MG capsule Take 2 mg by mouth as needed for diarrhea or loose stools. (Patient not taking: Reported on 11/25/2022)     No current facility-administered medications on file prior to visit.    Allergies:  No Known Allergies  Physical Exam  Today's Vitals   11/25/22 1004  BP: 133/67  Pulse: 60  Weight: 130 lb (59 kg)  Height: 5\' 11"  (1.803 m)     Body mass index is 18.13 kg/m.  General: Very Pleasant frail elderly Caucasian male seated, in no evident distress Head: head normocephalic and atraumatic.   Cardiovascular: regular rate and rhythm Musculoskeletal: limited R>L shoulder ROM (chronic) Skin:  no  rash/petichiae Vascular:  Normal pulses all extremities  Neurologic Exam Mental Status: Awake and fully alert.  Mild hypophonia but no evidence of dysarthria or aphasia.  Mild facial masking noted.  Oriented to place and time. Recent and remote memory intact. Attention span, concentration and fund of knowledge appropriate. Mood and affect appropriate.   Cranial Nerves: Pupils equal, briskly reactive to light. Extraocular movements full without nystagmus. Visual fields full to confrontation.  HOH bilaterally. Facial sensation intact. Face, tongue, palate moves normally and symmetrically.  Motor: Normal strength in all tested extremities.  Intermittent resting tremor of LUE>RUE.  Slight postural tremor.  Increased tone with cogwheel rigidity RUE>LUE.  Decreased finger tapping and foot tapping bilaterally. Sensory.: intact to touch , pinprick , position and vibratory sensation.  Gait and Station: Arises from chair without difficulty. Stance is stooped. Gait demonstrates slightly decreased stride length and step height bilaterally with decreased left arm swing and pill-rolling tremor. Reflexes: 1+ and symmetric. Toes downgoing.       ASSESSMENT/PLAN: 89 year long-standing recurrent transient stereotypical episodes of altered awareness with speech difficulties and starring possibly complex partial seizures.  Concern of progressive parkinsonism features on exam and per history and confirmed by [facility].     Complex partial seizures -No recent seizure activity -Continue Keppra XR 750 mg nightly for seizure prophylaxis.  Will try to reduce to 500 mg -Concern of recurrent events (10/2020 ED eval and 02/2021) more consistent with partial type seizure - similar episode 07/2019.      Parkinsonism  -Exam consistent with tremor, bradykinesia, rigidity, impaired gait, hypophonia, and hypomimia with subjective complaints of REM sleep behaviors, dysphagia, cognitive complaints and constipation -Patient was  taking Sinemet incorrectly and was having side effects and has discontinued it without any obvious worsening of his parkinsonian symptoms       I spent 35 minutes of face-to-face and non-face-to-face time with patient and wife.  This included previsit chart review, lab review, study review, order entry, electronic health record documentation, patient and wife education and discussion regarding above diagnoses and treatment plan and answered all other questions to patient wife satisfaction  Delia Heady, MD  [facility] [address] [place], [place] [address]  Phone [phone] Fax [phone] Note: This document was prepared with digital dictation and possible smart phrase technology. Any transcriptional errors that result from this process are unintentional.

## 2022-11-26 ENCOUNTER — Other Ambulatory Visit: Payer: Self-pay

## 2022-11-26 DIAGNOSIS — R195 Other fecal abnormalities: Secondary | ICD-10-CM

## 2022-11-26 DIAGNOSIS — R197 Diarrhea, unspecified: Secondary | ICD-10-CM

## 2022-11-26 DIAGNOSIS — D649 Anemia, unspecified: Secondary | ICD-10-CM

## 2022-11-29 DIAGNOSIS — I350 Nonrheumatic aortic (valve) stenosis: Secondary | ICD-10-CM | POA: Diagnosis not present

## 2022-11-29 DIAGNOSIS — I5042 Chronic combined systolic (congestive) and diastolic (congestive) heart failure: Secondary | ICD-10-CM | POA: Diagnosis not present

## 2022-11-29 DIAGNOSIS — I110 Hypertensive heart disease with heart failure: Secondary | ICD-10-CM | POA: Diagnosis not present

## 2022-11-29 DIAGNOSIS — G20A1 Parkinson's disease without dyskinesia, without mention of fluctuations: Secondary | ICD-10-CM | POA: Diagnosis not present

## 2022-11-29 DIAGNOSIS — E1129 Type 2 diabetes mellitus with other diabetic kidney complication: Secondary | ICD-10-CM | POA: Diagnosis not present

## 2022-11-29 DIAGNOSIS — G40209 Localization-related (focal) (partial) symptomatic epilepsy and epileptic syndromes with complex partial seizures, not intractable, without status epilepticus: Secondary | ICD-10-CM | POA: Diagnosis not present

## 2022-11-30 DIAGNOSIS — I110 Hypertensive heart disease with heart failure: Secondary | ICD-10-CM | POA: Diagnosis not present

## 2022-11-30 DIAGNOSIS — G20A1 Parkinson's disease without dyskinesia, without mention of fluctuations: Secondary | ICD-10-CM | POA: Diagnosis not present

## 2022-11-30 DIAGNOSIS — G40209 Localization-related (focal) (partial) symptomatic epilepsy and epileptic syndromes with complex partial seizures, not intractable, without status epilepticus: Secondary | ICD-10-CM | POA: Diagnosis not present

## 2022-11-30 DIAGNOSIS — E1129 Type 2 diabetes mellitus with other diabetic kidney complication: Secondary | ICD-10-CM | POA: Diagnosis not present

## 2022-11-30 DIAGNOSIS — I350 Nonrheumatic aortic (valve) stenosis: Secondary | ICD-10-CM | POA: Diagnosis not present

## 2022-11-30 DIAGNOSIS — I5042 Chronic combined systolic (congestive) and diastolic (congestive) heart failure: Secondary | ICD-10-CM | POA: Diagnosis not present

## 2022-12-02 ENCOUNTER — Emergency Department
Admission: EM | Admit: 2022-12-02 | Discharge: 2022-12-02 | Disposition: A | Discharge status: Home / Self Care | Payer: Medicare Other | Attending: Emergency Medicine | Admitting: Emergency Medicine

## 2022-12-02 ENCOUNTER — Emergency Department: Payer: Medicare Other

## 2022-12-02 DIAGNOSIS — I110 Hypertensive heart disease with heart failure: Secondary | ICD-10-CM | POA: Diagnosis not present

## 2022-12-02 DIAGNOSIS — R7989 Other specified abnormal findings of blood chemistry: Secondary | ICD-10-CM | POA: Insufficient documentation

## 2022-12-02 DIAGNOSIS — R4182 Altered mental status, unspecified: Secondary | ICD-10-CM | POA: Diagnosis not present

## 2022-12-02 DIAGNOSIS — Z9181 History of falling: Secondary | ICD-10-CM | POA: Diagnosis not present

## 2022-12-02 DIAGNOSIS — S0101XD Laceration without foreign body of scalp, subsequent encounter: Secondary | ICD-10-CM | POA: Diagnosis not present

## 2022-12-02 DIAGNOSIS — R55 Syncope and collapse: Secondary | ICD-10-CM | POA: Diagnosis not present

## 2022-12-02 DIAGNOSIS — E1129 Type 2 diabetes mellitus with other diabetic kidney complication: Secondary | ICD-10-CM | POA: Diagnosis not present

## 2022-12-02 DIAGNOSIS — G40209 Localization-related (focal) (partial) symptomatic epilepsy and epileptic syndromes with complex partial seizures, not intractable, without status epilepticus: Secondary | ICD-10-CM | POA: Diagnosis not present

## 2022-12-02 DIAGNOSIS — Z7901 Long term (current) use of anticoagulants: Secondary | ICD-10-CM | POA: Diagnosis not present

## 2022-12-02 DIAGNOSIS — I959 Hypotension, unspecified: Secondary | ICD-10-CM | POA: Diagnosis not present

## 2022-12-02 DIAGNOSIS — I480 Paroxysmal atrial fibrillation: Secondary | ICD-10-CM | POA: Diagnosis not present

## 2022-12-02 DIAGNOSIS — Z87891 Personal history of nicotine dependence: Secondary | ICD-10-CM | POA: Diagnosis not present

## 2022-12-02 DIAGNOSIS — I350 Nonrheumatic aortic (valve) stenosis: Secondary | ICD-10-CM | POA: Diagnosis not present

## 2022-12-02 DIAGNOSIS — R404 Transient alteration of awareness: Secondary | ICD-10-CM | POA: Diagnosis not present

## 2022-12-02 DIAGNOSIS — I5042 Chronic combined systolic (congestive) and diastolic (congestive) heart failure: Secondary | ICD-10-CM | POA: Diagnosis not present

## 2022-12-02 DIAGNOSIS — G4733 Obstructive sleep apnea (adult) (pediatric): Secondary | ICD-10-CM | POA: Diagnosis not present

## 2022-12-02 DIAGNOSIS — I6782 Cerebral ischemia: Secondary | ICD-10-CM | POA: Diagnosis not present

## 2022-12-02 DIAGNOSIS — R41841 Cognitive communication deficit: Secondary | ICD-10-CM | POA: Diagnosis not present

## 2022-12-02 DIAGNOSIS — G20A1 Parkinson's disease without dyskinesia, without mention of fluctuations: Secondary | ICD-10-CM | POA: Diagnosis not present

## 2022-12-02 DIAGNOSIS — R1312 Dysphagia, oropharyngeal phase: Secondary | ICD-10-CM | POA: Diagnosis not present

## 2022-12-02 DIAGNOSIS — Z8546 Personal history of malignant neoplasm of prostate: Secondary | ICD-10-CM | POA: Diagnosis not present

## 2022-12-02 DIAGNOSIS — E7800 Pure hypercholesterolemia, unspecified: Secondary | ICD-10-CM | POA: Diagnosis not present

## 2022-12-02 DIAGNOSIS — H3530 Unspecified macular degeneration: Secondary | ICD-10-CM | POA: Diagnosis not present

## 2022-12-02 LAB — CBC
HCT: 30.7 % — ABNORMAL LOW (ref 39.0–52.0)
Hemoglobin: 9.6 g/dL — ABNORMAL LOW (ref 13.0–17.0)
MCH: 32 pg (ref 26.0–34.0)
MCHC: 31.3 g/dL (ref 30.0–36.0)
MCV: 102.3 fL — ABNORMAL HIGH (ref 80.0–100.0)
Platelets: 272 10*3/uL (ref 150–400)
RBC: 3 MIL/uL — ABNORMAL LOW (ref 4.22–5.81)
RDW: 13.5 % (ref 11.5–15.5)
WBC: 7.3 10*3/uL (ref 4.0–10.5)
nRBC: 0 % (ref 0.0–0.2)

## 2022-12-02 LAB — TROPONIN I (HIGH SENSITIVITY)
Troponin I (High Sensitivity): 18 ng/L — ABNORMAL HIGH (ref <18)
Troponin I (High Sensitivity): 18 ng/L — ABNORMAL HIGH (ref <18)

## 2022-12-02 LAB — COMPREHENSIVE METABOLIC PANEL
ALT: 11 U/L (ref 0–44)
AST: 15 U/L (ref 15–41)
Albumin: 3.6 g/dL (ref 3.5–5.0)
Alkaline Phosphatase: 40 U/L (ref 38–126)
Anion gap: 7 (ref 5–15)
BUN: 38 mg/dL — ABNORMAL HIGH (ref 8–23)
CO2: 22 mmol/L (ref 22–32)
Calcium: 8.5 mg/dL — ABNORMAL LOW (ref 8.9–10.3)
Chloride: 109 mmol/L (ref 98–111)
Creatinine, Ser: 1.42 mg/dL — ABNORMAL HIGH (ref 0.61–1.24)
GFR, Estimated: 48 mL/min — ABNORMAL LOW (ref >60)
Glucose, Bld: 106 mg/dL — ABNORMAL HIGH (ref 70–99)
Potassium: 4.6 mmol/L (ref 3.5–5.1)
Sodium: 138 mmol/L (ref 135–145)
Total Bilirubin: 0.7 mg/dL (ref 0.3–1.2)
Total Protein: 6.4 g/dL — ABNORMAL LOW (ref 6.5–8.1)

## 2022-12-02 MED ORDER — SODIUM CHLORIDE 0.9 % IV BOLUS
500.0000 mL | Freq: Once | INTRAVENOUS | Status: AC
  Administered 2022-12-02: 500 mL via INTRAVENOUS

## 2022-12-02 NOTE — ED Triage Notes (Signed)
Pt BIBA from home. EMS called for pt being unresponsive. Initial BP was 90/54, 111/57 after 500 NS. CBG 126. Pt slow to answer questions. Airway patent

## 2022-12-02 NOTE — ED Provider Notes (Signed)
[facility] Provider Note    Event Date/Time   First MD Initiated Contact with Patient 12/02/22 1125     (approximate)   History   Altered Mental Status   HPI  Jeffrey [name] is a 89 y.o. male presenting to the emergency department for evaluation after a syncopal episode.  Accompanied by wife who provides collateral history.  She was driving earlier today when she looked over and noticed that patient had passed out. Patient remained unresponsive for a few minutes.  She reports a history of recurrent episodes similar to this for several years, but does feel the episodes may be getting worse over the past month and a half or so. Patient was seen on 6/14 for a similar episode.  They do report that they have been following up as an outpatient without clear etiology.  Patient denies chest pain, shortness of breath, dizziness.      Physical Exam   Triage Vital Signs: ED Triage Vitals  Enc Vitals Group     BP 12/02/22 1125 (!) 145/72     Pulse Rate 12/02/22 1125 78     Resp 12/02/22 1125 12     Temp 12/02/22 1125 97.9 F (36.6 C)     Temp Source 12/02/22 1125 Oral     SpO2 12/02/22 1121 96 %     Weight 12/02/22 1126 130 lb 1.1 oz (59 kg)     Height 12/02/22 1126 5\' 11"  (1.803 m)     Head Circumference --      Peak Flow --      Pain Score 12/02/22 1126 0     Pain Loc --      Pain Edu? --      Excl. in GC? --     Most recent vital signs: Vitals:   12/02/22 1400 12/02/22 1500  BP: (!) 164/89 (!) 160/90  Pulse: 88 87  Resp:    Temp:    SpO2: 100% 100%     General: Awake, interactive  CV:  Regular rate, good peripheral perfusion.  Resp:  Lungs clear, unlabored respirations.  Abd:  Soft, nondistended.  Neuro:  Alert and oriented, normal extraocular movements, symmetric facial movement, sensation intact over bilateral upper and lower extremities with 5 out of 5 strength.  Normal coordination   ED Results / Procedures / Treatments   Labs (all  labs ordered are listed, but only abnormal results are displayed) Labs Reviewed  CBC - Abnormal; Notable for the following components:      Result Value   RBC 3.00 (*)    Hemoglobin 9.6 (*)    HCT 30.7 (*)    MCV 102.3 (*)    All other components within normal limits  COMPREHENSIVE METABOLIC PANEL - Abnormal; Notable for the following components:   Glucose, Bld 106 (*)    BUN 38 (*)    Creatinine, Ser 1.42 (*)    Calcium 8.5 (*)    Total Protein 6.4 (*)    GFR, Estimated 48 (*)    All other components within normal limits  TROPONIN I (HIGH SENSITIVITY) - Abnormal; Notable for the following components:   Troponin I (High Sensitivity) 18 (*)    All other components within normal limits  TROPONIN I (HIGH SENSITIVITY) - Abnormal; Notable for the following components:   Troponin I (High Sensitivity) 18 (*)    All other components within normal limits  CBG MONITORING, ED     EKG EKG independently reviewed interpreted by  myself (ER attending) demonstrates:  EKG demonstrates sinus rhythm rate 79, PR 184, QRS 139, QTc 504, right bundle branch block morphology noted  RADIOLOGY Imaging independently reviewed and interpreted by myself demonstrates:  Head CT without acute bleed  PROCEDURES:  Critical Care performed: No  Procedures   MEDICATIONS ORDERED IN ED: Medications  sodium chloride 0.9 % bolus 500 mL (0 mLs Intravenous Stopped 12/02/22 1333)     IMPRESSION / MDM / ASSESSMENT AND PLAN / ED COURSE  I reviewed the triage vital signs and the nursing notes.  Differential diagnosis includes, but is not limited to, arrhythmia, anemia, electrolyte abnormality, intracranial bleed, orthostasis  Patient's presentation is most consistent with acute presentation with potential threat to life or bodily function.  89 year old male presenting following a syncopal episode after which she was unresponsive for several minutes.  Back at baseline mental status here.  Labs with stable anemia.   Mild renal impairment compared to prior, did receive  fluid bolus here.  Troponin slightly elevated at 18, previously 15.  Repeat obtained which was stable.  Patient reevaluated and updated on the results of his workup.  I did discuss the possibility of admission given his recurrent syncopal episodes of unclear etiology.  After discussion of risk and benefits, patient did prefer to follow-up as an outpatient and his wife agreed with this plan.  Given subacute to chronic nature of these episodes, do not think this is unreasonable.  Strict return precautions were provided.  Patient was discharged in stable condition.     FINAL CLINICAL IMPRESSION(S) / ED DIAGNOSES   Final diagnoses:  Syncope and collapse     Rx / DC Orders   ED Discharge Orders     None        Note:  This document was prepared using Dragon voice recognition software and may include unintentional dictation errors.   Trinna Post, MD 12/02/22 [phone]

## 2022-12-02 NOTE — ED Notes (Signed)
Assisted pt with urinal. Changed brief. Pulled up in bed. Family at bedside.

## 2022-12-02 NOTE — Discharge Instructions (Addendum)
You were seen in the emergency room today for evaluation of an episode of passing out.  Your blood work and head CT did not show an emergency cause for this.  We discussed admitting you to the hospital, but you did prefer to follow-up as an outpatient.  Please make sure you contact your primary care doctor to arrange close follow-up.  Please return to the ER for new or worsening symptoms.

## 2022-12-06 ENCOUNTER — Telehealth: Payer: Self-pay | Admitting: Neurology

## 2022-12-06 DIAGNOSIS — I5042 Chronic combined systolic (congestive) and diastolic (congestive) heart failure: Secondary | ICD-10-CM | POA: Diagnosis not present

## 2022-12-06 DIAGNOSIS — I350 Nonrheumatic aortic (valve) stenosis: Secondary | ICD-10-CM | POA: Diagnosis not present

## 2022-12-06 DIAGNOSIS — I110 Hypertensive heart disease with heart failure: Secondary | ICD-10-CM | POA: Diagnosis not present

## 2022-12-06 DIAGNOSIS — E1129 Type 2 diabetes mellitus with other diabetic kidney complication: Secondary | ICD-10-CM | POA: Diagnosis not present

## 2022-12-06 DIAGNOSIS — G40209 Localization-related (focal) (partial) symptomatic epilepsy and epileptic syndromes with complex partial seizures, not intractable, without status epilepticus: Secondary | ICD-10-CM | POA: Diagnosis not present

## 2022-12-06 DIAGNOSIS — G20A1 Parkinson's disease without dyskinesia, without mention of fluctuations: Secondary | ICD-10-CM | POA: Diagnosis not present

## 2022-12-06 MED FILL — Sodium Chloride IV Soln 0.9%: INTRAVENOUS | Qty: 500 | Status: AC

## 2022-12-06 NOTE — Telephone Encounter (Signed)
Westlyn called from [facility]. Stated she wants to add occupational HH to pt plan. He is requesting a call back from nurse.

## 2022-12-06 NOTE — Telephone Encounter (Signed)
Called [place] back. There was no answer was able to LVM on secure VM. Provided the VO for [facility] OT

## 2022-12-07 ENCOUNTER — Telehealth: Payer: Self-pay

## 2022-12-07 NOTE — Telephone Encounter (Signed)
Transition Care Management Follow-up Telephone Call Date of discharge and from where: 12/02/2022 [facility] How have you been since you were released from the hospital? Patient is doing the same. Any questions or concerns? No  Items Reviewed: Did the pt receive and understand the discharge instructions provided? Yes  Medications obtained and verified? Yes  Other? No  Any new allergies since your discharge? No  Dietary orders reviewed? Yes Do you have support at home? Yes   Follow up appointments reviewed:  PCP Hospital f/u appt confirmed? No  Scheduled to see  on  @ . Specialist Hospital f/u appt confirmed? No  Scheduled to see  on  @ . Are transportation arrangements needed? No  If their condition worsens, is the pt aware to call PCP or go to the Emergency Dept.? Yes Was the patient provided with contact information for the PCP's office or ED? Yes Was to pt encouraged to call back with questions or concerns? Yes  Aarya Quebedeaux [facility]  [name] Population Health Community Resource Care Guide   [email or web address]  ?? [phone]   Website: [email or web address]  [email or web address]

## 2022-12-08 DIAGNOSIS — E1129 Type 2 diabetes mellitus with other diabetic kidney complication: Secondary | ICD-10-CM | POA: Diagnosis not present

## 2022-12-08 DIAGNOSIS — I5042 Chronic combined systolic (congestive) and diastolic (congestive) heart failure: Secondary | ICD-10-CM | POA: Diagnosis not present

## 2022-12-08 DIAGNOSIS — I350 Nonrheumatic aortic (valve) stenosis: Secondary | ICD-10-CM | POA: Diagnosis not present

## 2022-12-08 DIAGNOSIS — I110 Hypertensive heart disease with heart failure: Secondary | ICD-10-CM | POA: Diagnosis not present

## 2022-12-08 DIAGNOSIS — G20A1 Parkinson's disease without dyskinesia, without mention of fluctuations: Secondary | ICD-10-CM | POA: Diagnosis not present

## 2022-12-08 DIAGNOSIS — G40209 Localization-related (focal) (partial) symptomatic epilepsy and epileptic syndromes with complex partial seizures, not intractable, without status epilepticus: Secondary | ICD-10-CM | POA: Diagnosis not present

## 2022-12-09 DIAGNOSIS — Z961 Presence of intraocular lens: Secondary | ICD-10-CM | POA: Diagnosis not present

## 2022-12-09 DIAGNOSIS — H353231 Exudative age-related macular degeneration, bilateral, with active choroidal neovascularization: Secondary | ICD-10-CM | POA: Diagnosis not present

## 2022-12-13 DIAGNOSIS — I5042 Chronic combined systolic (congestive) and diastolic (congestive) heart failure: Secondary | ICD-10-CM | POA: Diagnosis not present

## 2022-12-13 DIAGNOSIS — I110 Hypertensive heart disease with heart failure: Secondary | ICD-10-CM | POA: Diagnosis not present

## 2022-12-13 DIAGNOSIS — G20A1 Parkinson's disease without dyskinesia, without mention of fluctuations: Secondary | ICD-10-CM | POA: Diagnosis not present

## 2022-12-13 DIAGNOSIS — E1129 Type 2 diabetes mellitus with other diabetic kidney complication: Secondary | ICD-10-CM | POA: Diagnosis not present

## 2022-12-13 DIAGNOSIS — I350 Nonrheumatic aortic (valve) stenosis: Secondary | ICD-10-CM | POA: Diagnosis not present

## 2022-12-13 DIAGNOSIS — G40209 Localization-related (focal) (partial) symptomatic epilepsy and epileptic syndromes with complex partial seizures, not intractable, without status epilepticus: Secondary | ICD-10-CM | POA: Diagnosis not present

## 2022-12-14 ENCOUNTER — Telehealth: Payer: Self-pay | Admitting: Neurology

## 2022-12-14 DIAGNOSIS — G40209 Localization-related (focal) (partial) symptomatic epilepsy and epileptic syndromes with complex partial seizures, not intractable, without status epilepticus: Secondary | ICD-10-CM | POA: Diagnosis not present

## 2022-12-14 DIAGNOSIS — G20A1 Parkinson's disease without dyskinesia, without mention of fluctuations: Secondary | ICD-10-CM | POA: Diagnosis not present

## 2022-12-14 DIAGNOSIS — E1129 Type 2 diabetes mellitus with other diabetic kidney complication: Secondary | ICD-10-CM | POA: Diagnosis not present

## 2022-12-14 DIAGNOSIS — I350 Nonrheumatic aortic (valve) stenosis: Secondary | ICD-10-CM | POA: Diagnosis not present

## 2022-12-14 DIAGNOSIS — I110 Hypertensive heart disease with heart failure: Secondary | ICD-10-CM | POA: Diagnosis not present

## 2022-12-14 DIAGNOSIS — I5042 Chronic combined systolic (congestive) and diastolic (congestive) heart failure: Secondary | ICD-10-CM | POA: Diagnosis not present

## 2022-12-14 NOTE — Telephone Encounter (Signed)
Returned [employer] call and informed her it okay for the patient to have his OT evaluation this week. Darral Dash thank me for call back.

## 2022-12-14 NOTE — Telephone Encounter (Signed)
Darral Dash, OT with [facility] has called asking the OT evaluation date be moved from last week to this week per patient request. She can be reached at [phone] her vm is secure, she has asked this be sent as High Priority.

## 2022-12-15 DIAGNOSIS — I5042 Chronic combined systolic (congestive) and diastolic (congestive) heart failure: Secondary | ICD-10-CM | POA: Diagnosis not present

## 2022-12-15 DIAGNOSIS — G20A1 Parkinson's disease without dyskinesia, without mention of fluctuations: Secondary | ICD-10-CM | POA: Diagnosis not present

## 2022-12-15 DIAGNOSIS — I350 Nonrheumatic aortic (valve) stenosis: Secondary | ICD-10-CM | POA: Diagnosis not present

## 2022-12-15 DIAGNOSIS — G40209 Localization-related (focal) (partial) symptomatic epilepsy and epileptic syndromes with complex partial seizures, not intractable, without status epilepticus: Secondary | ICD-10-CM | POA: Diagnosis not present

## 2022-12-15 DIAGNOSIS — E1129 Type 2 diabetes mellitus with other diabetic kidney complication: Secondary | ICD-10-CM | POA: Diagnosis not present

## 2022-12-15 DIAGNOSIS — I110 Hypertensive heart disease with heart failure: Secondary | ICD-10-CM | POA: Diagnosis not present

## 2022-12-15 NOTE — Telephone Encounter (Signed)
Called Darral Dash and provided the VO over the phone

## 2022-12-15 NOTE — Telephone Encounter (Addendum)
[name] called Requesting OT  order for 2 week for 3 times a week

## 2022-12-16 ENCOUNTER — Telehealth: Payer: Self-pay

## 2022-12-16 DIAGNOSIS — I110 Hypertensive heart disease with heart failure: Secondary | ICD-10-CM | POA: Diagnosis not present

## 2022-12-16 DIAGNOSIS — G20A1 Parkinson's disease without dyskinesia, without mention of fluctuations: Secondary | ICD-10-CM | POA: Diagnosis not present

## 2022-12-16 DIAGNOSIS — G40209 Localization-related (focal) (partial) symptomatic epilepsy and epileptic syndromes with complex partial seizures, not intractable, without status epilepticus: Secondary | ICD-10-CM | POA: Diagnosis not present

## 2022-12-16 DIAGNOSIS — I350 Nonrheumatic aortic (valve) stenosis: Secondary | ICD-10-CM | POA: Diagnosis not present

## 2022-12-16 DIAGNOSIS — E1129 Type 2 diabetes mellitus with other diabetic kidney complication: Secondary | ICD-10-CM | POA: Diagnosis not present

## 2022-12-16 DIAGNOSIS — I5042 Chronic combined systolic (congestive) and diastolic (congestive) heart failure: Secondary | ICD-10-CM | POA: Diagnosis not present

## 2022-12-16 NOTE — Telephone Encounter (Signed)
Orders faxed to [facility]

## 2022-12-20 DIAGNOSIS — I350 Nonrheumatic aortic (valve) stenosis: Secondary | ICD-10-CM | POA: Diagnosis not present

## 2022-12-20 DIAGNOSIS — I5042 Chronic combined systolic (congestive) and diastolic (congestive) heart failure: Secondary | ICD-10-CM | POA: Diagnosis not present

## 2022-12-20 DIAGNOSIS — E1129 Type 2 diabetes mellitus with other diabetic kidney complication: Secondary | ICD-10-CM | POA: Diagnosis not present

## 2022-12-20 DIAGNOSIS — I110 Hypertensive heart disease with heart failure: Secondary | ICD-10-CM | POA: Diagnosis not present

## 2022-12-20 DIAGNOSIS — G40209 Localization-related (focal) (partial) symptomatic epilepsy and epileptic syndromes with complex partial seizures, not intractable, without status epilepticus: Secondary | ICD-10-CM | POA: Diagnosis not present

## 2022-12-20 DIAGNOSIS — G20A1 Parkinson's disease without dyskinesia, without mention of fluctuations: Secondary | ICD-10-CM | POA: Diagnosis not present

## 2022-12-22 DIAGNOSIS — Z961 Presence of intraocular lens: Secondary | ICD-10-CM | POA: Diagnosis not present

## 2022-12-22 DIAGNOSIS — H353231 Exudative age-related macular degeneration, bilateral, with active choroidal neovascularization: Secondary | ICD-10-CM | POA: Diagnosis not present

## 2022-12-22 DIAGNOSIS — I5042 Chronic combined systolic (congestive) and diastolic (congestive) heart failure: Secondary | ICD-10-CM | POA: Diagnosis not present

## 2022-12-22 DIAGNOSIS — I350 Nonrheumatic aortic (valve) stenosis: Secondary | ICD-10-CM | POA: Diagnosis not present

## 2022-12-22 DIAGNOSIS — G20A1 Parkinson's disease without dyskinesia, without mention of fluctuations: Secondary | ICD-10-CM | POA: Diagnosis not present

## 2022-12-22 DIAGNOSIS — E1129 Type 2 diabetes mellitus with other diabetic kidney complication: Secondary | ICD-10-CM | POA: Diagnosis not present

## 2022-12-22 DIAGNOSIS — I110 Hypertensive heart disease with heart failure: Secondary | ICD-10-CM | POA: Diagnosis not present

## 2022-12-22 DIAGNOSIS — G40209 Localization-related (focal) (partial) symptomatic epilepsy and epileptic syndromes with complex partial seizures, not intractable, without status epilepticus: Secondary | ICD-10-CM | POA: Diagnosis not present

## 2022-12-24 DIAGNOSIS — I110 Hypertensive heart disease with heart failure: Secondary | ICD-10-CM | POA: Diagnosis not present

## 2022-12-24 DIAGNOSIS — I5042 Chronic combined systolic (congestive) and diastolic (congestive) heart failure: Secondary | ICD-10-CM | POA: Diagnosis not present

## 2022-12-24 DIAGNOSIS — G40209 Localization-related (focal) (partial) symptomatic epilepsy and epileptic syndromes with complex partial seizures, not intractable, without status epilepticus: Secondary | ICD-10-CM | POA: Diagnosis not present

## 2022-12-24 DIAGNOSIS — I350 Nonrheumatic aortic (valve) stenosis: Secondary | ICD-10-CM | POA: Diagnosis not present

## 2022-12-24 DIAGNOSIS — E1129 Type 2 diabetes mellitus with other diabetic kidney complication: Secondary | ICD-10-CM | POA: Diagnosis not present

## 2022-12-24 DIAGNOSIS — G20A1 Parkinson's disease without dyskinesia, without mention of fluctuations: Secondary | ICD-10-CM | POA: Diagnosis not present

## 2022-12-28 DIAGNOSIS — I5042 Chronic combined systolic (congestive) and diastolic (congestive) heart failure: Secondary | ICD-10-CM | POA: Diagnosis not present

## 2022-12-28 DIAGNOSIS — I110 Hypertensive heart disease with heart failure: Secondary | ICD-10-CM | POA: Diagnosis not present

## 2022-12-28 DIAGNOSIS — I350 Nonrheumatic aortic (valve) stenosis: Secondary | ICD-10-CM | POA: Diagnosis not present

## 2022-12-28 DIAGNOSIS — E1129 Type 2 diabetes mellitus with other diabetic kidney complication: Secondary | ICD-10-CM | POA: Diagnosis not present

## 2022-12-28 DIAGNOSIS — G40209 Localization-related (focal) (partial) symptomatic epilepsy and epileptic syndromes with complex partial seizures, not intractable, without status epilepticus: Secondary | ICD-10-CM | POA: Diagnosis not present

## 2022-12-28 DIAGNOSIS — G20A1 Parkinson's disease without dyskinesia, without mention of fluctuations: Secondary | ICD-10-CM | POA: Diagnosis not present

## 2022-12-30 DIAGNOSIS — E1129 Type 2 diabetes mellitus with other diabetic kidney complication: Secondary | ICD-10-CM | POA: Diagnosis not present

## 2022-12-30 DIAGNOSIS — I350 Nonrheumatic aortic (valve) stenosis: Secondary | ICD-10-CM | POA: Diagnosis not present

## 2022-12-30 DIAGNOSIS — I5042 Chronic combined systolic (congestive) and diastolic (congestive) heart failure: Secondary | ICD-10-CM | POA: Diagnosis not present

## 2022-12-30 DIAGNOSIS — G20A1 Parkinson's disease without dyskinesia, without mention of fluctuations: Secondary | ICD-10-CM | POA: Diagnosis not present

## 2022-12-30 DIAGNOSIS — I110 Hypertensive heart disease with heart failure: Secondary | ICD-10-CM | POA: Diagnosis not present

## 2022-12-30 DIAGNOSIS — G40209 Localization-related (focal) (partial) symptomatic epilepsy and epileptic syndromes with complex partial seizures, not intractable, without status epilepticus: Secondary | ICD-10-CM | POA: Diagnosis not present

## 2023-01-01 DIAGNOSIS — G20A1 Parkinson's disease without dyskinesia, without mention of fluctuations: Secondary | ICD-10-CM | POA: Diagnosis not present

## 2023-01-01 DIAGNOSIS — I480 Paroxysmal atrial fibrillation: Secondary | ICD-10-CM | POA: Diagnosis not present

## 2023-01-01 DIAGNOSIS — Z7901 Long term (current) use of anticoagulants: Secondary | ICD-10-CM | POA: Diagnosis not present

## 2023-01-01 DIAGNOSIS — G40209 Localization-related (focal) (partial) symptomatic epilepsy and epileptic syndromes with complex partial seizures, not intractable, without status epilepticus: Secondary | ICD-10-CM | POA: Diagnosis not present

## 2023-01-01 DIAGNOSIS — I5042 Chronic combined systolic (congestive) and diastolic (congestive) heart failure: Secondary | ICD-10-CM | POA: Diagnosis not present

## 2023-01-01 DIAGNOSIS — G4733 Obstructive sleep apnea (adult) (pediatric): Secondary | ICD-10-CM | POA: Diagnosis not present

## 2023-01-01 DIAGNOSIS — R1312 Dysphagia, oropharyngeal phase: Secondary | ICD-10-CM | POA: Diagnosis not present

## 2023-01-01 DIAGNOSIS — Z8546 Personal history of malignant neoplasm of prostate: Secondary | ICD-10-CM | POA: Diagnosis not present

## 2023-01-01 DIAGNOSIS — E1129 Type 2 diabetes mellitus with other diabetic kidney complication: Secondary | ICD-10-CM | POA: Diagnosis not present

## 2023-01-01 DIAGNOSIS — E7800 Pure hypercholesterolemia, unspecified: Secondary | ICD-10-CM | POA: Diagnosis not present

## 2023-01-01 DIAGNOSIS — Z79891 Long term (current) use of opiate analgesic: Secondary | ICD-10-CM | POA: Diagnosis not present

## 2023-01-01 DIAGNOSIS — I110 Hypertensive heart disease with heart failure: Secondary | ICD-10-CM | POA: Diagnosis not present

## 2023-01-01 DIAGNOSIS — Z9181 History of falling: Secondary | ICD-10-CM | POA: Diagnosis not present

## 2023-01-01 DIAGNOSIS — H3530 Unspecified macular degeneration: Secondary | ICD-10-CM | POA: Diagnosis not present

## 2023-01-01 DIAGNOSIS — Z87891 Personal history of nicotine dependence: Secondary | ICD-10-CM | POA: Diagnosis not present

## 2023-01-01 DIAGNOSIS — I350 Nonrheumatic aortic (valve) stenosis: Secondary | ICD-10-CM | POA: Diagnosis not present

## 2023-01-03 DIAGNOSIS — G20A1 Parkinson's disease without dyskinesia, without mention of fluctuations: Secondary | ICD-10-CM | POA: Diagnosis not present

## 2023-01-03 DIAGNOSIS — I110 Hypertensive heart disease with heart failure: Secondary | ICD-10-CM | POA: Diagnosis not present

## 2023-01-03 DIAGNOSIS — I350 Nonrheumatic aortic (valve) stenosis: Secondary | ICD-10-CM | POA: Diagnosis not present

## 2023-01-03 DIAGNOSIS — G40209 Localization-related (focal) (partial) symptomatic epilepsy and epileptic syndromes with complex partial seizures, not intractable, without status epilepticus: Secondary | ICD-10-CM | POA: Diagnosis not present

## 2023-01-03 DIAGNOSIS — E1129 Type 2 diabetes mellitus with other diabetic kidney complication: Secondary | ICD-10-CM | POA: Diagnosis not present

## 2023-01-03 DIAGNOSIS — I5042 Chronic combined systolic (congestive) and diastolic (congestive) heart failure: Secondary | ICD-10-CM | POA: Diagnosis not present

## 2023-01-10 ENCOUNTER — Other Ambulatory Visit: Payer: Self-pay

## 2023-01-10 DIAGNOSIS — I350 Nonrheumatic aortic (valve) stenosis: Secondary | ICD-10-CM | POA: Diagnosis not present

## 2023-01-10 DIAGNOSIS — E1129 Type 2 diabetes mellitus with other diabetic kidney complication: Secondary | ICD-10-CM | POA: Diagnosis not present

## 2023-01-10 DIAGNOSIS — I110 Hypertensive heart disease with heart failure: Secondary | ICD-10-CM | POA: Diagnosis not present

## 2023-01-10 DIAGNOSIS — I5042 Chronic combined systolic (congestive) and diastolic (congestive) heart failure: Secondary | ICD-10-CM | POA: Diagnosis not present

## 2023-01-10 DIAGNOSIS — G20A1 Parkinson's disease without dyskinesia, without mention of fluctuations: Secondary | ICD-10-CM | POA: Diagnosis not present

## 2023-01-10 DIAGNOSIS — G40209 Localization-related (focal) (partial) symptomatic epilepsy and epileptic syndromes with complex partial seizures, not intractable, without status epilepticus: Secondary | ICD-10-CM | POA: Diagnosis not present

## 2023-01-11 NOTE — Progress Notes (Deleted)
Celso Amy, PA-C [address]  [address]  [place], [place] [address]  Main: [phone]  Fax: [phone]   Gastroenterology Consultation  Referring Provider:     Joselyn Arrow, MD Primary Care Physician:  Joselyn Arrow, MD Primary Gastroenterologist:  *** Reason for Consultation:     Heme positive stool, Anemia        HPI:   Jeffrey [name] is a 89 y.o. y/o male referred for consultation & management  by Joselyn Arrow, MD.    Referred to evaluate diarrhea, anemia, weight loss, and Hemoccult positive stool.  Lab 12/02/2022 showed hemoglobin 9.6, hematocrit 30.7, MCV 102, platelets 272.  He takes B12 supplement.  Stage III CKD with BUN 38, creatinine 1.42, GFR 48. Labs 10/2022 showed normal ferritin 88.  No recent iron or B12 labs. Lab 04/2022 showed elevated B12 of 1226.  Total iron 66, iron saturation 24%. Lab in 2023 showed hemoglobin between 11 and 12.  No recent stool studies or abdominal imaging.  Modified barium swallow test showed pharyngeal swallow appears weak and delayed with gross silent aspiration of thin liquids.    Previously saw Dr. Rhea Belton at [facility] in [place] in 2014 for radiation proctitis. Last colonoscopy 11/2012 by Dr. Rhea Belton (with movie prep) showed fair prep in the right colon, good prep in the left colon.  There was radiation proctitis and one fourth of the distal rectum.  Remainder of the colon was normal.  No polyps.  He was treated with Canasa mesalamine suppositories for 2 weeks.  Has history of A-fib, CHF, diabetes, aortic stenosis, prostate cancer, TIA, CVA, Parkinson's, and seizures disorder.  Currently on Eliquis.  Past Medical History:  Diagnosis Date   Acute CVA (cerebrovascular accident) (HCC)    Acute encephalopathy 11/27/2014   Aortic atherosclerosis (HCC) 11/25/2014   Atrial fibrillation (HCC)    a. 12/2018 Zio: Sinus rhythm, avg rate 71. 2 runs of VT (max 17 beats). 27 runs SVT (max 14 beats). Occas PACs/PVCs. No high grade AV  block/pauses.   Chronic combined systolic (congestive) and diastolic (congestive) heart failure (HCC)    a. 10/2014 Echo: EF 40-45%; b. 03/2018 Echo: EF 55-60%; c. 12/2018 Echo: EF 55-60%, mild conc LVH. Nl RV fxn. RVSP 51.22mmHg. Mildly dil LA. Mod AS w/ severe Ca2+; d. 12/2019 Echo: EF 50-55%, mild LVH, gr2 DD. Sev elev PASP. Mild MR, mild to mod TR. Triv AI, mod to sev AS (area 0.77cm^2, mean grad . Vmax 2.57m/s).   Diabetes mellitus    Hypercholesteremia    Hypertension    Impaired fasting glucose    Macular degeneration    Moderate to severe aortic stenosis    a. 12/2018 Echo: EF 55-60%, Mod AS w/ sev AoV Ca2+; b. 12/2019 Echo: EF 50-55%. Mod to sev AS (area 0.77cm^2, mean grad . Vmax 2.43m/s).   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 Rhea Belton)   RMSF [facility] spotted fever) 10/2014   (hosp with FUO, encephalopathy)   Rotator cuff tear, right 10/2010   supraspinatous and infraspinatous   Seizure disorder (HCC) 07/25/2019   TIA (transient ischemic attack) 10/02/13   hosp at [facility] x 1 night   Type 2 diabetes mellitus with microalbuminuria or microproteinuria 01/15/2014    Past Surgical History:  Procedure Laterality Date   CATARACT EXTRACTION Right 01/2017  CIRCUMCISION  01/2017   Dr. Earlene Plater   COLONOSCOPY N/A 11/28/2012   Procedure: COLONOSCOPY;  Surgeon: Beverley Fiedler, MD;  Location: [facility];  Service: Gastroenterology;  Laterality: N/A;   INTERNAL URETHROTOMY  01/27/2016   WF   prostate seed implant     ROTATOR CUFF REPAIR  06/16/2011   right (Dr. Luiz Blare)   TONSILLECTOMY AND ADENOIDECTOMY  age 45    Prior to Admission medications   Medication Sig Start Date End Date Taking? Authorizing Provider  acetaminophen (TYLENOL) 500 MG tablet Take 500 mg by mouth 2 (two) times daily.    [provider]  apixaban (ELIQUIS) 2.5 MG TABS tablet Take 1 tablet (2.5 mg total) by mouth 2 (two) times daily. 11/05/22   Iran Ouch, MD  carbidopa-levodopa (SINEMET IR) 25-100 MG tablet Take 1 tablet by mouth 3 (three) times daily. Patient not taking: Reported on 11/10/2022 10/28/22 04/26/23  Glean Salvo, NP  carvedilol (COREG) 6.25 MG tablet TAKE ONE TABLET BY MOUTH TWICE DAILY 11/12/22   Iran Ouch, MD  Cyanocobalamin (VITAMIN B 12 PO) Take 1,000 mcg by mouth.    [provider]  ipratropium (ATROVENT) 0.03 % nasal spray Place 2 sprays into both nostrils 3 (three) times daily as needed for rhinitis. 09/20/22   Joselyn Arrow, MD  levETIRAcetam (KEPPRA XR) 500 MG 24 hr tablet Take 1 tablet (500 mg total) by mouth daily. 11/25/22   Micki Riley, MD  loperamide (IMODIUM) 2 MG capsule Take 2 mg by mouth as needed for diarrhea or loose stools. Patient not taking: Reported on 11/25/2022    [provider]  loratadine (CLARITIN) 10 MG tablet Take by mouth. 11/17/18   [provider]  mirabegron ER (MYRBETRIQ) 50 MG TB24 tablet Take 1 tablet (50 mg total) by mouth daily. 10/06/22   Michiel Cowboy A, PA-C  Multiple Vitamins-Minerals (PRESERVISION AREDS) CAPS Take 1 capsule by mouth 2 (two) times daily.     [provider]  UNABLE TO FIND Bevacizumab(AVASTIN) chemo injection for the eye every 10 weeks    [provider]    Family History  Problem Relation Age of Onset   Dementia Mother    Diabetes Mother    Heart disease Mother    Hypertension Mother    Heart disease Father 48       Died suddenly.  No diagnosis   Stroke Sister    Cancer Daughter        ovarian (?) vs other male cancer; s/p hyst doing well   Diabetes Daughter        GDM, and now AODM   Heart disease Son        congestive heart failure--improved   Congestive Heart Failure Grandchild      Social History   Tobacco Use   Smoking status: Former    Current packs/day:  0.00    Average packs/day: 1 pack/day for 20.0 years (20.0 ttl pk-yrs)    Types: Cigarettes    Start date: 05/31/1960    Quit date: 05/31/1980    Years since quitting: 42.6   Smokeless tobacco: Never  Vaping Use   Vaping status: Never Used  Substance Use Topics   Alcohol use: Not Currently    Alcohol/week: 3.0 standard drinks of alcohol    Types: 3 Cans of beer per week    Comment: e beer 3x/week   Drug use: No    Allergies as of 01/12/2023   (No Known Allergies)  Review of Systems:    All systems reviewed and negative except where noted in HPI.   Physical Exam:  There were no vitals taken for this visit. No LMP for male patient.  General:   Alert,  Well-developed, well-nourished, pleasant and cooperative in NAD Lungs:  Respirations even and unlabored.  Clear throughout to auscultation.   No wheezes, crackles, or rhonchi. No acute distress. Heart:  Regular rate and rhythm; no murmurs, clicks, rubs, or gallops. Abdomen:  Normal bowel sounds.  No bruits.  Soft, and non-distended without masses, hepatosplenomegaly or hernias noted.  No Tenderness.  No guarding or rebound tenderness.    Neurologic:  Alert and oriented x3;  grossly normal neurologically. Psych:  Alert and cooperative. Normal mood and affect.  Imaging Studies: No results found.  Assessment and Plan:   DEKOTA WASLEY is a 89 y.o. y/o male has been referred for:  1.  Diarrhea  Stool studies: C. difficile toxin PCR, GI pathogen panel  2.  Anemia -macrocytic  Labs: CBC, iron panel, ferritin, B12, folate  3.  Weight loss  CT abdomen pelvis  4.  Heme positive stool; last colonoscopy in 2014 showed radiation proctitis  Trial of hydrocortisone suppository?  Flexible sigmoidoscopy?  5.  Comorbidities: History of CVA, A-fib, on Eliquis, Parkinson's, seizure disorder, diabetes, CHF, aortic stenosis.  He would be considered high risk for colonoscopy procedure given advanced age and multiple comorbidities.  Follow  up ***  Celso Amy, PA-C    BP check ***

## 2023-01-12 ENCOUNTER — Ambulatory Visit: Payer: Medicare Other | Admitting: Physician Assistant

## 2023-01-22 DIAGNOSIS — E1129 Type 2 diabetes mellitus with other diabetic kidney complication: Secondary | ICD-10-CM | POA: Diagnosis not present

## 2023-01-22 DIAGNOSIS — G40209 Localization-related (focal) (partial) symptomatic epilepsy and epileptic syndromes with complex partial seizures, not intractable, without status epilepticus: Secondary | ICD-10-CM | POA: Diagnosis not present

## 2023-01-22 DIAGNOSIS — G20A1 Parkinson's disease without dyskinesia, without mention of fluctuations: Secondary | ICD-10-CM | POA: Diagnosis not present

## 2023-01-22 DIAGNOSIS — I350 Nonrheumatic aortic (valve) stenosis: Secondary | ICD-10-CM | POA: Diagnosis not present

## 2023-01-22 DIAGNOSIS — I5042 Chronic combined systolic (congestive) and diastolic (congestive) heart failure: Secondary | ICD-10-CM | POA: Diagnosis not present

## 2023-01-22 DIAGNOSIS — I110 Hypertensive heart disease with heart failure: Secondary | ICD-10-CM | POA: Diagnosis not present

## 2023-01-24 ENCOUNTER — Encounter: Payer: Self-pay | Admitting: Family Medicine

## 2023-01-25 DIAGNOSIS — I110 Hypertensive heart disease with heart failure: Secondary | ICD-10-CM | POA: Diagnosis not present

## 2023-01-25 DIAGNOSIS — G20A1 Parkinson's disease without dyskinesia, without mention of fluctuations: Secondary | ICD-10-CM | POA: Diagnosis not present

## 2023-01-25 DIAGNOSIS — G40209 Localization-related (focal) (partial) symptomatic epilepsy and epileptic syndromes with complex partial seizures, not intractable, without status epilepticus: Secondary | ICD-10-CM | POA: Diagnosis not present

## 2023-01-25 DIAGNOSIS — I350 Nonrheumatic aortic (valve) stenosis: Secondary | ICD-10-CM | POA: Diagnosis not present

## 2023-01-25 DIAGNOSIS — E1129 Type 2 diabetes mellitus with other diabetic kidney complication: Secondary | ICD-10-CM | POA: Diagnosis not present

## 2023-01-25 DIAGNOSIS — I5042 Chronic combined systolic (congestive) and diastolic (congestive) heart failure: Secondary | ICD-10-CM | POA: Diagnosis not present

## 2023-01-31 DIAGNOSIS — R1312 Dysphagia, oropharyngeal phase: Secondary | ICD-10-CM | POA: Diagnosis not present

## 2023-01-31 DIAGNOSIS — E1129 Type 2 diabetes mellitus with other diabetic kidney complication: Secondary | ICD-10-CM | POA: Diagnosis not present

## 2023-01-31 DIAGNOSIS — I110 Hypertensive heart disease with heart failure: Secondary | ICD-10-CM | POA: Diagnosis not present

## 2023-01-31 DIAGNOSIS — G40209 Localization-related (focal) (partial) symptomatic epilepsy and epileptic syndromes with complex partial seizures, not intractable, without status epilepticus: Secondary | ICD-10-CM | POA: Diagnosis not present

## 2023-01-31 DIAGNOSIS — Z9181 History of falling: Secondary | ICD-10-CM | POA: Diagnosis not present

## 2023-01-31 DIAGNOSIS — H3530 Unspecified macular degeneration: Secondary | ICD-10-CM | POA: Diagnosis not present

## 2023-01-31 DIAGNOSIS — Z79891 Long term (current) use of opiate analgesic: Secondary | ICD-10-CM | POA: Diagnosis not present

## 2023-01-31 DIAGNOSIS — Z8546 Personal history of malignant neoplasm of prostate: Secondary | ICD-10-CM | POA: Diagnosis not present

## 2023-01-31 DIAGNOSIS — E7800 Pure hypercholesterolemia, unspecified: Secondary | ICD-10-CM | POA: Diagnosis not present

## 2023-01-31 DIAGNOSIS — I480 Paroxysmal atrial fibrillation: Secondary | ICD-10-CM | POA: Diagnosis not present

## 2023-01-31 DIAGNOSIS — G4733 Obstructive sleep apnea (adult) (pediatric): Secondary | ICD-10-CM | POA: Diagnosis not present

## 2023-01-31 DIAGNOSIS — Z7901 Long term (current) use of anticoagulants: Secondary | ICD-10-CM | POA: Diagnosis not present

## 2023-01-31 DIAGNOSIS — I350 Nonrheumatic aortic (valve) stenosis: Secondary | ICD-10-CM | POA: Diagnosis not present

## 2023-01-31 DIAGNOSIS — Z87891 Personal history of nicotine dependence: Secondary | ICD-10-CM | POA: Diagnosis not present

## 2023-01-31 DIAGNOSIS — G20A1 Parkinson's disease without dyskinesia, without mention of fluctuations: Secondary | ICD-10-CM | POA: Diagnosis not present

## 2023-01-31 DIAGNOSIS — I5042 Chronic combined systolic (congestive) and diastolic (congestive) heart failure: Secondary | ICD-10-CM | POA: Diagnosis not present

## 2023-02-01 DIAGNOSIS — I110 Hypertensive heart disease with heart failure: Secondary | ICD-10-CM | POA: Diagnosis not present

## 2023-02-01 DIAGNOSIS — G20A1 Parkinson's disease without dyskinesia, without mention of fluctuations: Secondary | ICD-10-CM | POA: Diagnosis not present

## 2023-02-01 DIAGNOSIS — E1129 Type 2 diabetes mellitus with other diabetic kidney complication: Secondary | ICD-10-CM | POA: Diagnosis not present

## 2023-02-01 DIAGNOSIS — I5042 Chronic combined systolic (congestive) and diastolic (congestive) heart failure: Secondary | ICD-10-CM | POA: Diagnosis not present

## 2023-02-01 DIAGNOSIS — I350 Nonrheumatic aortic (valve) stenosis: Secondary | ICD-10-CM | POA: Diagnosis not present

## 2023-02-01 DIAGNOSIS — G40209 Localization-related (focal) (partial) symptomatic epilepsy and epileptic syndromes with complex partial seizures, not intractable, without status epilepticus: Secondary | ICD-10-CM | POA: Diagnosis not present

## 2023-02-02 ENCOUNTER — Telehealth: Payer: Self-pay | Admitting: Neurology

## 2023-02-02 NOTE — Telephone Encounter (Signed)
Called the PT and left a VM on [place] secure VM

## 2023-02-02 NOTE — Telephone Encounter (Signed)
Gerri Spore PT w/ [facility] has called requesting verbal orders for 1 week 9 his voicemail is secure at [phone]

## 2023-02-04 ENCOUNTER — Other Ambulatory Visit: Payer: Self-pay | Admitting: Neurology

## 2023-02-07 DIAGNOSIS — I110 Hypertensive heart disease with heart failure: Secondary | ICD-10-CM | POA: Diagnosis not present

## 2023-02-07 DIAGNOSIS — E1129 Type 2 diabetes mellitus with other diabetic kidney complication: Secondary | ICD-10-CM | POA: Diagnosis not present

## 2023-02-07 DIAGNOSIS — I5042 Chronic combined systolic (congestive) and diastolic (congestive) heart failure: Secondary | ICD-10-CM | POA: Diagnosis not present

## 2023-02-07 DIAGNOSIS — G40209 Localization-related (focal) (partial) symptomatic epilepsy and epileptic syndromes with complex partial seizures, not intractable, without status epilepticus: Secondary | ICD-10-CM | POA: Diagnosis not present

## 2023-02-07 DIAGNOSIS — I350 Nonrheumatic aortic (valve) stenosis: Secondary | ICD-10-CM | POA: Diagnosis not present

## 2023-02-07 DIAGNOSIS — G20A1 Parkinson's disease without dyskinesia, without mention of fluctuations: Secondary | ICD-10-CM | POA: Diagnosis not present

## 2023-02-09 ENCOUNTER — Telehealth: Payer: Self-pay

## 2023-02-09 NOTE — Telephone Encounter (Signed)
Order faxed to [facility]

## 2023-02-14 DIAGNOSIS — E1129 Type 2 diabetes mellitus with other diabetic kidney complication: Secondary | ICD-10-CM | POA: Diagnosis not present

## 2023-02-14 DIAGNOSIS — I5042 Chronic combined systolic (congestive) and diastolic (congestive) heart failure: Secondary | ICD-10-CM | POA: Diagnosis not present

## 2023-02-14 DIAGNOSIS — I110 Hypertensive heart disease with heart failure: Secondary | ICD-10-CM | POA: Diagnosis not present

## 2023-02-14 DIAGNOSIS — G20A1 Parkinson's disease without dyskinesia, without mention of fluctuations: Secondary | ICD-10-CM | POA: Diagnosis not present

## 2023-02-14 DIAGNOSIS — G40209 Localization-related (focal) (partial) symptomatic epilepsy and epileptic syndromes with complex partial seizures, not intractable, without status epilepticus: Secondary | ICD-10-CM | POA: Diagnosis not present

## 2023-02-14 DIAGNOSIS — I350 Nonrheumatic aortic (valve) stenosis: Secondary | ICD-10-CM | POA: Diagnosis not present

## 2023-02-16 ENCOUNTER — Ambulatory Visit (INDEPENDENT_AMBULATORY_CARE_PROVIDER_SITE_OTHER): Payer: Medicare Other | Admitting: Gastroenterology

## 2023-02-16 ENCOUNTER — Encounter: Payer: Self-pay | Admitting: Gastroenterology

## 2023-02-16 VITALS — BP 143/82 | HR 68 | Temp 97.7°F | Ht 71.0 in | Wt 125.0 lb

## 2023-02-16 DIAGNOSIS — R634 Abnormal weight loss: Secondary | ICD-10-CM | POA: Diagnosis not present

## 2023-02-16 DIAGNOSIS — R194 Change in bowel habit: Secondary | ICD-10-CM

## 2023-02-16 DIAGNOSIS — K921 Melena: Secondary | ICD-10-CM | POA: Diagnosis not present

## 2023-02-16 DIAGNOSIS — R197 Diarrhea, unspecified: Secondary | ICD-10-CM

## 2023-02-16 NOTE — Progress Notes (Signed)
Gastroenterology Consultation  Referring Provider:     Joselyn Arrow, MD Primary Care Physician:  Joselyn Arrow, MD Primary Gastroenterologist:  Dr. Servando Snare     Reason for Consultation:     Weight loss heme positive stools and change in bowel habits        HPI:   Jeffrey [name] is a 89 y.o. y/o male referred for consultation & management of weight loss heme positive stools and change in bowel habits by Dr. Joselyn Arrow, MD. This patient comes in today with his wife and appears frail.  The patient reports that he is not sure why he is here to see me today although he does report that he has alternating diarrhea and constipation that has been going on for couple weeks.  The patient was also noted to have anemia and heme positive stools.  The patient also reports that he weighed in excess of 170 pounds in the past and now weighs 125 pounds today.  He states this is after he had recently gained some weight. The patient denies any black stools or bloody stools.  He also denies any abdominal pain.  Past Medical History:  Diagnosis Date   Acute CVA (cerebrovascular accident) (HCC)    Acute encephalopathy 11/27/2014   Aortic atherosclerosis (HCC) 11/25/2014   Atrial fibrillation (HCC)    a. 12/2018 Zio: Sinus rhythm, avg rate 71. 2 runs of VT (max 17 beats). 27 runs SVT (max 14 beats). Occas PACs/PVCs. No high grade AV block/pauses.   Chronic combined systolic (congestive) and diastolic (congestive) heart failure (HCC)    a. 10/2014 Echo: EF 40-45%; b. 03/2018 Echo: EF 55-60%; c. 12/2018 Echo: EF 55-60%, mild conc LVH. Nl RV fxn. RVSP 51.64mmHg. Mildly dil LA. Mod AS w/ severe Ca2+; d. 12/2019 Echo: EF 50-55%, mild LVH, gr2 DD. Sev elev PASP. Mild MR, mild to mod TR. Triv AI, mod to sev AS (area 0.77cm^2, mean grad . Vmax 2.62m/s).   Diabetes mellitus    Hypercholesteremia    Hypertension    Impaired fasting glucose    Macular degeneration    Moderate to severe aortic stenosis    a. 12/2018 Echo: EF  55-60%, Mod AS w/ sev AoV Ca2+; b. 12/2019 Echo: EF 50-55%. Mod to sev AS (area 0.77cm^2, mean grad . Vmax 2.68m/s).   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 Rhea Belton)   RMSF [facility] spotted fever) 10/2014   (hosp with FUO, encephalopathy)   Rotator cuff tear, right 10/2010   supraspinatous and infraspinatous   Seizure disorder (HCC) 07/25/2019   TIA (transient ischemic attack) 10/02/13   hosp at [facility] x 1 night   Type 2 diabetes mellitus with microalbuminuria or microproteinuria 01/15/2014    Past Surgical History:  Procedure Laterality Date   CATARACT EXTRACTION Right 01/2017   CIRCUMCISION  01/2017   Dr. Earlene Plater   COLONOSCOPY N/A 11/28/2012   Procedure: COLONOSCOPY;  Surgeon: Beverley Fiedler, MD;  Location: [facility];  Service: Gastroenterology;  Laterality: N/A;   INTERNAL URETHROTOMY  01/27/2016   WF   prostate seed implant     ROTATOR CUFF REPAIR  06/16/2011   right (Dr. Luiz Blare)   TONSILLECTOMY AND ADENOIDECTOMY  age 51    Prior to Admission medications   Medication Sig Start Date End Date Taking? Authorizing Provider  acetaminophen (TYLENOL) 500 MG tablet Take 500 mg by mouth 2 (two) times daily.   Yes [provider]  apixaban (ELIQUIS) 2.5 MG TABS tablet Take 1 tablet (2.5 mg total) by mouth 2 (two) times daily. 11/05/22  Yes Iran Ouch, MD  carvedilol (COREG) 6.25 MG tablet TAKE ONE TABLET BY MOUTH TWICE DAILY 11/12/22  Yes Iran Ouch, MD  Cyanocobalamin (VITAMIN B 12 PO) Take 1,000 mcg by mouth.   Yes [provider]  ipratropium (ATROVENT) 0.03 % nasal spray Place 2 sprays into both nostrils 3 (three) times daily as needed for rhinitis. 09/20/22  Yes Joselyn Arrow, MD  levETIRAcetam (KEPPRA XR) 500 MG 24 hr tablet Take 1 tablet (500 mg total) by mouth daily. 11/25/22   Yes Micki Riley, MD  loperamide (IMODIUM) 2 MG capsule Take 2 mg by mouth as needed for diarrhea or loose stools.   Yes [provider]  loratadine (CLARITIN) 10 MG tablet Take by mouth. 11/17/18  Yes [provider]  mirabegron ER (MYRBETRIQ) 50 MG TB24 tablet Take 1 tablet (50 mg total) by mouth daily. 10/06/22  Yes McGowan, Carollee Herter A, PA-C  Multiple Vitamins-Minerals (PRESERVISION AREDS) CAPS Take 1 capsule by mouth 2 (two) times daily.    Yes [provider]  UNABLE TO FIND Bevacizumab(AVASTIN) chemo injection for the eye every 10 weeks   Yes [provider]    Family History  Problem Relation Age of Onset   Dementia Mother    Diabetes Mother    Heart disease Mother    Hypertension Mother    Heart disease Father 83       Died suddenly.  No diagnosis   Stroke Sister    Cancer Daughter        ovarian (?) vs other male cancer; s/p hyst doing well   Diabetes Daughter        GDM, and now AODM   Heart disease Son        congestive heart failure--improved   Congestive Heart Failure Grandchild      Social History   Tobacco Use   Smoking status: Former    Current packs/day: 0.00    Average packs/day: 1 pack/day for 20.0 years (20.0 ttl pk-yrs)    Types: Cigarettes    Start date: 05/31/1960    Quit date: 05/31/1980    Years since quitting: 42.7   Smokeless tobacco: Never  Vaping Use   Vaping status: Never Used  Substance Use Topics   Alcohol use: Not Currently    Alcohol/week: 3.0 standard drinks of alcohol    Types: 3 Cans of beer per week    Comment: e beer 3x/week   Drug use: No    Allergies as of 02/16/2023   (No Known Allergies)    Review of Systems:    All systems reviewed and negative except where noted in HPI.   Physical Exam:  BP (!) 143/82 (BP Location: Left Arm, Patient Position: Sitting)   Pulse 68   Temp 97.7 F (36.5 C) (Oral)   Ht 5\' 11"  (1.803 m)   Wt 125 lb (56.7 kg)   BMI 17.43 kg/m  No LMP for male  patient. General:   Alert,  Well-developed, well-nourished, pleasant and cooperative in NAD Head:  Normocephalic and atraumatic. Eyes:  Sclera clear, no icterus.   Conjunctiva pink. Ears:  Normal auditory acuity. Neck:  Supple; no masses or thyromegaly. Lungs:  Respirations even and unlabored.  Clear throughout to auscultation.  No wheezes, crackles, or rhonchi. No acute distress. Heart:  Regular rate and rhythm; no murmurs, clicks, rubs, or gallops. Abdomen:  Normal bowel sounds.  No bruits.  Soft, non-tender and non-distended without masses, hepatosplenomegaly or hernias noted.  No guarding or rebound tenderness.  Negative Carnett sign.   Rectal:  Deferred.  Pulses:  Normal pulses noted. Extremities:  No clubbing or edema.  No cyanosis. Neurologic:  Alert and oriented x3;  grossly normal neurologically. Skin:  Intact without significant lesions or rashes.  No jaundice. Lymph Nodes:  No significant cervical adenopathy. Psych:  Alert and cooperative. Normal mood and affect.  Imaging Studies: No results found.  Assessment and Plan:   Jeffrey [name] is a 89 y.o. y/o male who comes in today with a finding of heme positive stools with a change in bowel habits with diarrhea and constipation and weight loss.  The patient has been told that the next step would be a colonoscopy since he denies any upper GI symptoms such as dysphagia or early satiety.  The patient states that he would like to think about it for a week and contact me if he decides to proceed with a colonoscopy.  I have explained the risks and benefits and how his age and anticoagulation 17 to him during these procedures.  The patient and his wife have been explained the plan and agree with it.    Midge Minium, MD. Clementeen Graham    Note: This dictation was prepared with Dragon dictation along with smaller phrase technology. Any transcriptional errors that result from this process are unintentional.

## 2023-02-22 ENCOUNTER — Telehealth: Payer: Self-pay

## 2023-02-22 NOTE — Telephone Encounter (Signed)
Pt wife requesting call back to schedule pts colonoscopy

## 2023-02-23 NOTE — Telephone Encounter (Signed)
Patient saw Dr Servando Snare on 02/16/2023

## 2023-02-24 DIAGNOSIS — G20A1 Parkinson's disease without dyskinesia, without mention of fluctuations: Secondary | ICD-10-CM | POA: Diagnosis not present

## 2023-02-24 DIAGNOSIS — I110 Hypertensive heart disease with heart failure: Secondary | ICD-10-CM | POA: Diagnosis not present

## 2023-02-24 DIAGNOSIS — E1129 Type 2 diabetes mellitus with other diabetic kidney complication: Secondary | ICD-10-CM | POA: Diagnosis not present

## 2023-02-24 DIAGNOSIS — I350 Nonrheumatic aortic (valve) stenosis: Secondary | ICD-10-CM | POA: Diagnosis not present

## 2023-02-24 DIAGNOSIS — I5042 Chronic combined systolic (congestive) and diastolic (congestive) heart failure: Secondary | ICD-10-CM | POA: Diagnosis not present

## 2023-02-24 DIAGNOSIS — G40209 Localization-related (focal) (partial) symptomatic epilepsy and epileptic syndromes with complex partial seizures, not intractable, without status epilepticus: Secondary | ICD-10-CM | POA: Diagnosis not present

## 2023-02-25 ENCOUNTER — Other Ambulatory Visit: Payer: Self-pay

## 2023-02-25 DIAGNOSIS — R194 Change in bowel habit: Secondary | ICD-10-CM

## 2023-02-25 DIAGNOSIS — K921 Melena: Secondary | ICD-10-CM

## 2023-02-25 DIAGNOSIS — R634 Abnormal weight loss: Secondary | ICD-10-CM

## 2023-02-25 MED ORDER — NA SULFATE-K SULFATE-MG SULF 17.5-3.13-1.6 GM/177ML PO SOLN: 1.0000 | Freq: Once | ORAL | 0 refills | Status: AC

## 2023-02-26 ENCOUNTER — Other Ambulatory Visit: Payer: Self-pay

## 2023-02-26 ENCOUNTER — Emergency Department: Payer: Medicare Other

## 2023-02-26 ENCOUNTER — Emergency Department
Admission: EM | Admit: 2023-02-26 | Discharge: 2023-02-26 | Disposition: A | Discharge status: Home / Self Care | Payer: Medicare Other | Attending: Emergency Medicine | Admitting: Emergency Medicine

## 2023-02-26 DIAGNOSIS — S0101XA Laceration without foreign body of scalp, initial encounter: Secondary | ICD-10-CM | POA: Diagnosis not present

## 2023-02-26 DIAGNOSIS — G20C Parkinsonism, unspecified: Secondary | ICD-10-CM | POA: Diagnosis not present

## 2023-02-26 DIAGNOSIS — W19XXXA Unspecified fall, initial encounter: Secondary | ICD-10-CM | POA: Diagnosis not present

## 2023-02-26 MED ORDER — LIDOCAINE-EPINEPHRINE 1 %-1:100000 IJ SOLN
30.0000 mL | Freq: Once | INTRAMUSCULAR | Status: AC
  Administered 2023-02-26: 30 mL
  Filled 2023-02-26: qty 30

## 2023-02-26 MED ORDER — BACITRACIN ZINC 500 UNIT/GM EX OINT
TOPICAL_OINTMENT | CUTANEOUS | Status: AC
  Administered 2023-02-26: 1
  Filled 2023-02-26: qty 0.9

## 2023-02-26 MED ORDER — LIDOCAINE HCL (PF) 1 % IJ SOLN: 5.0000 mL | Freq: Once | INTRAMUSCULAR | Status: DC

## 2023-02-26 NOTE — ED Provider Notes (Signed)
[facility] Provider Note    Event Date/Time   First MD Initiated Contact with Patient 02/26/23 1744     (approximate)   History   Fall   HPI  Jeffrey [name] is a 89 y.o. male  who presents to the emergency department today after a fall. The patient has history of falls and parkinson.  Hit the back of his head. The patient denies any other significant injury. Did suffer a skin tear to the left elbow but not having any pain. Denies any recent illness.      Physical Exam   Triage Vital Signs: ED Triage Vitals  Encounter Vitals Group     BP 02/26/23 1736 131/70     Systolic BP Percentile --      Diastolic BP Percentile --      Pulse Rate 02/26/23 1736 72     Resp 02/26/23 1736 17     Temp 02/26/23 1736 (!) 97.5 F (36.4 C)     Temp Source 02/26/23 1736 Oral     SpO2 02/26/23 1736 98 %     Weight 02/26/23 1731 135 lb (61.2 kg)     Height 02/26/23 1731 5\' 11"  (1.803 m)     Head Circumference --      Peak Flow --      Pain Score 02/26/23 1731 0     Pain Loc --      Pain Education --      Exclude from Growth Chart --     Most recent vital signs: Vitals:   02/26/23 1736  BP: 131/70  Pulse: 72  Resp: 17  Temp: (!) 97.5 F (36.4 C)  SpO2: 98%   General: Awake, alert, oriented. CV:  Good peripheral perfusion. Resp:  Normal effort.  Abd:  No distention.  Other:  Laceration posterior to the left ear.    ED Results / Procedures / Treatments   Labs (all labs ordered are listed, but only abnormal results are displayed) Labs Reviewed - No data to display   EKG  None   RADIOLOGY I independently interpreted and visualized the CT head/cervical spine. My interpretation: no intracranial bleed Radiology interpretation:  IMPRESSION:  1. No acute intracranial pathology.  2. Soft tissue contusion and laceration of the left parietal scalp.  3. No fracture or static subluxation of the cervical spine.  4. Moderate multilevel cervical disc  degenerative disease.      PROCEDURES:  Critical Care performed: No  Procedures  LACERATION REPAIR Performed by: Phineas Semen Authorized by: Phineas Semen Consent: Verbal consent obtained. Risks and benefits: risks, benefits and alternatives were discussed Consent given by: patient Patient identity confirmed: provided demographic data Prepped and Draped in normal sterile fashion Wound explored  Laceration Location: left scalp  Laceration Length: 2.5 cm  No Foreign Bodies seen or palpated  Anesthesia: local infiltration  Local anesthetic: lidocaine  with epinephrine  Anesthetic total: 3 ml  Skin closure: staples  Number of staples: 7   Patient tolerance: Patient tolerated the procedure well with no immediate complications.   MEDICATIONS ORDERED IN ED: Medications  lidocaine (PF) (XYLOCAINE) 1 % injection 5 mL (has no administration in time range)     IMPRESSION / MDM / ASSESSMENT AND PLAN / ED COURSE  I reviewed the triage vital signs and the nursing notes.  Differential diagnosis includes, but is not limited to, ICH, fracture  Patient's presentation is most consistent with acute presentation with potential threat to life or bodily function.   Patient presented to the emergency department today because of concerns for laceration that occurred after a fall.  CT head and cervical spine without acute findings.  Patient's laceration was stapled shut.  Did discuss with patient and family possibility of checking blood work however they felt comfortable deferring at this time given the patient has history of falls.  Discussed infection return precautions.  FINAL CLINICAL IMPRESSION(S) / ED DIAGNOSES   Final diagnoses:  Fall, initial encounter  Laceration of scalp, initial encounter        Note:  This document was prepared using Dragon voice recognition software and may include unintentional dictation errors.    Phineas Semen, MD 02/26/23 [phone]

## 2023-02-26 NOTE — ED Notes (Signed)
Applied bacitracin, non adherent pad and gauze around head to secure.

## 2023-02-26 NOTE — Discharge Instructions (Signed)
Staples will have to be removed in 7-10 days.

## 2023-02-26 NOTE — ED Triage Notes (Signed)
Pt c/o laceration to left side of back of the head following a fall. Pt states he tripped over something at home; denies LOC. Pt is on Eliquis. Per family, pt fell at about 11am, but they didn't notice his head injury until around 1pm. Pt denies a 2nd fall.

## 2023-03-01 ENCOUNTER — Telehealth: Payer: Self-pay | Admitting: Cardiovascular Disease

## 2023-03-01 NOTE — Telephone Encounter (Signed)
Pharmacy please advise on holding Eliquis prior to colonoscopy scheduled for 03/24/2023. Thank you.

## 2023-03-01 NOTE — Telephone Encounter (Signed)
Patient with diagnosis of afib on Eliquis for anticoagulation.    Procedure: colonoscopy Date of procedure: 03/24/23  CHA2DS2-VASc Score = 8  This indicates a 10.8% annual risk of stroke. The patient's score is based upon: CHF History: 1 HTN History: 1 Diabetes History: 1 Stroke History: 2 Vascular Disease History: 1 Age Score: 2 Gender Score: 0   TIA 2015, stroke 2016  CrCl 54mL/min Platelet count 272K  Per office protocol, patient can hold Eliquis for 1 day prior to procedure.    **This guidance is not considered finalized until pre-operative APP has relayed final recommendations.**

## 2023-03-01 NOTE — Telephone Encounter (Signed)
Pre-operative Risk Assessment    Patient Name: Jeffrey [name]  DOB: April 02, 1933 MRN: [id number]      Request for Surgical Clearance    Procedure:  Colonoscopy  Date of Surgery:  Clearance 03/24/23                                 Surgeon:  not indicated Surgeon's Group or Practice Name:  [facility] Phone number:  [phone] Fax number:  [phone]   Type of Clearance Requested:  Pharamcy, hold Eliquis    Type of Anesthesia:  General    Additional requests/questions:    Signed, Lauralee Evener V   03/01/2023, 11:22 AM

## 2023-03-01 NOTE — Telephone Encounter (Signed)
Pt scheduled for 10/24 and clearance faxed to cardiology

## 2023-03-01 NOTE — Telephone Encounter (Signed)
Patient Name: Jeffrey [name]  DOB: 1932-09-26 MRN: [id number]  Primary Cardiologist: Lorine Bears, MD  Clinical pharmacists have reviewed the patient's past medical history, labs, and current medications as part of preoperative protocol coverage. The following recommendations have been made:  Per office protocol, patient can hold Eliquis for 1 day prior to procedure.    I will route this recommendation to the requesting party via Epic fax function and remove from pre-op pool.  Please call with questions.  Napoleon Form, Leodis Rains, NP 03/01/2023, 12:28 PM

## 2023-03-07 ENCOUNTER — Emergency Department
Admission: EM | Admit: 2023-03-07 | Discharge: 2023-03-07 | Disposition: A | Discharge status: Home / Self Care | Payer: Medicare Other | Attending: Emergency Medicine | Admitting: Emergency Medicine

## 2023-03-07 ENCOUNTER — Other Ambulatory Visit: Payer: Self-pay

## 2023-03-07 DIAGNOSIS — I10 Essential (primary) hypertension: Secondary | ICD-10-CM | POA: Insufficient documentation

## 2023-03-07 DIAGNOSIS — Z4802 Encounter for removal of sutures: Secondary | ICD-10-CM | POA: Insufficient documentation

## 2023-03-07 DIAGNOSIS — E119 Type 2 diabetes mellitus without complications: Secondary | ICD-10-CM | POA: Insufficient documentation

## 2023-03-07 DIAGNOSIS — G20C Parkinsonism, unspecified: Secondary | ICD-10-CM | POA: Insufficient documentation

## 2023-03-07 DIAGNOSIS — S0101XD Laceration without foreign body of scalp, subsequent encounter: Secondary | ICD-10-CM | POA: Diagnosis not present

## 2023-03-07 NOTE — ED Notes (Signed)
Patient was noted to have significant matting of old blood to his hair around the site of the staples. Patient was assisted with combing and removal of said matting.

## 2023-03-07 NOTE — ED Triage Notes (Signed)
Pt her for staple removal. Pt denies any other needs.

## 2023-03-07 NOTE — ED Notes (Signed)
See triage notes. Patient in for staple removal.

## 2023-03-07 NOTE — ED Provider Notes (Signed)
[facility] Provider Note    Event Date/Time   First MD Initiated Contact with Patient 03/07/23 [phone]     (approximate)   History   Chief Complaint Suture / Staple Removal   HPI  Jeffrey [name] is a 89 y.o. male with past medical history of hypertension, diabetes, Parkinson disease, stroke, and atrial fibrillation who presents to the ED complaining of staple removal.  Patient had a fall approximately 1 week ago and received staples to his head for scalp laceration.  Family states that the wound has been healing well since then with no bleeding, redness, swelling, or pain.  He denies any complaints other than needing the staples to be removed.     Physical Exam   Triage Vital Signs: ED Triage Vitals [03/07/23 0824]  Encounter Vitals Group     BP 131/65     Systolic BP Percentile      Diastolic BP Percentile      Pulse Rate 84     Resp 19     Temp 98 F (36.7 C)     Temp src      SpO2 99 %     Weight      Height      Head Circumference      Peak Flow      Pain Score 0     Pain Loc      Pain Education      Exclude from Growth Chart     Most recent vital signs: Vitals:   03/07/23 0824  BP: 131/65  Pulse: 84  Resp: 19  Temp: 98 F (36.7 C)  SpO2: 99%    Constitutional: Alert and oriented. Eyes: Conjunctivae are normal. Head: Staples in place to left posterior scalp, no bleeding, erythema, warmth, or tenderness noted. Nose: No congestion/rhinnorhea. Mouth/Throat: Mucous membranes are moist.  Cardiovascular: Normal rate, regular rhythm. Grossly normal heart sounds.  2+ radial pulses bilaterally. Respiratory: Normal respiratory effort.  No retractions. Lungs CTAB. Gastrointestinal: Soft and nontender. No distention. Musculoskeletal: No lower extremity tenderness nor edema.  Neurologic:  Normal speech and language. No gross focal neurologic deficits are appreciated.    ED Results / Procedures / Treatments   Labs (all labs ordered  are listed, but only abnormal results are displayed) Labs Reviewed - No data to display   PROCEDURES:  Critical Care performed: No  .Suture Removal  Date/Time: 03/07/2023 9:45 AM  Performed by: Chesley Noon, MD Authorized by: Chesley Noon, MD   Consent:    Consent obtained:  Verbal   Consent given by:  Patient and guardian Universal protocol:    Patient identity confirmed:  Verbally with patient and arm band Location:    Location:  Head/neck   Head/neck location:  Scalp Procedure details:    Wound appearance:  No signs of infection and good wound healing   Number of staples removed:  7 Post-procedure details:    Post-removal:  No dressing applied   Procedure completion:  Tolerated well, no immediate complications    MEDICATIONS ORDERED IN ED: Medications - No data to display   IMPRESSION / MDM / ASSESSMENT AND PLAN / ED COURSE  I reviewed the triage vital signs and the nursing notes.                              89 y.o. male with past medical history of hypertension, diabetes, stroke, atrial fibrillation, and Parkinson's  disease who presents to the ED for staple removal.  Patient's presentation is most consistent with acute, uncomplicated illness.  Differential diagnosis includes, but is not limited to, staple removal, cellulitis, bleeding wound.  Patient well-appearing and in no acute distress, vital signs are unremarkable.  Wound to his scalp appears to be well-healed, staples were removed without difficulty.  Patient appropriate for outpatient follow-up with PCP, family counseled to have him return to the ED for new or worsening symptoms.  Patient and family agree with plan.      FINAL CLINICAL IMPRESSION(S) / ED DIAGNOSES   Final diagnoses:  Encounter for staple removal     Rx / DC Orders   ED Discharge Orders     None        Note:  This document was prepared using Dragon voice recognition software and may include unintentional dictation  errors.   Chesley Noon, MD 03/07/23 [phone]

## 2023-03-09 ENCOUNTER — Other Ambulatory Visit (HOSPITAL_COMMUNITY): Payer: Self-pay

## 2023-03-13 NOTE — Progress Notes (Unsigned)
No chief complaint on file.  Patient presents for 6 month follow-up on chronic problems.  He had a fall 9/28, resulting in scalp laceration. He was treated in ER-- CT head and cervical spine without acute findings. Patient's laceration was stapled shut, staples removed in ER on 03/07/23.  He had ER visit for syncope 7/4. He did not f/u here afterwards. BUN and Cr were both elevated. Due for recheck.    Chemistry      Component Value Date/Time   NA 138 12/02/2022 1127   NA 140 11/10/2022 1215   NA 137 10/01/2013 1719   K 4.6 12/02/2022 1127   K 3.6 10/01/2013 1719   CL 109 12/02/2022 1127   CL 101 10/01/2013 1719   CO2 22 12/02/2022 1127   CO2 27 10/01/2013 1719   BUN 38 (H) 12/02/2022 1127   BUN 31 (H) 11/10/2022 1215   BUN 17 10/01/2013 1719   CREATININE 1.42 (H) 12/02/2022 1127   CREATININE 0.78 05/27/2017 1007      Component Value Date/Time   CALCIUM 8.5 (L) 12/02/2022 1127   CALCIUM 9.4 10/01/2013 1719   ALKPHOS 40 12/02/2022 1127   ALKPHOS 46 10/01/2013 1719   AST 15 12/02/2022 1127   AST 31 10/01/2013 1719   ALT 11 12/02/2022 1127   ALT 45 10/01/2013 1719   BILITOT 0.7 12/02/2022 1127   BILITOT 0.5 09/23/2022 0830   BILITOT 0.4 10/01/2013 1719       He is scheduled for colonoscopy on 10/24 by Dr. Servando Snare ([facility]), due to weight loss, blood in stools, anemia, and change in bowels (constipation and diarrhea).  He is to hold his eliquis for 1 day prior to procedure.  Lab Results  Component Value Date   WBC 7.3 12/02/2022   HGB 9.6 (L) 12/02/2022   HCT 30.7 (L) 12/02/2022   MCV 102.3 (H) 12/02/2022   PLT 272 12/02/2022   Hyperlipidemia:  Compliant with atorvastatin 20mg  (though this med was d/c'd from his med list by someone from GI on 8/12??) ***  He denies side effects. Tries to follow a lowfat, low cholesterol diet. Lipids were at goal on last check, on this medication.  Lab Results  Component Value Date   CHOL 155 09/23/2022   HDL 64 09/23/2022    LDLCALC 75 09/23/2022   TRIG 87 09/23/2022   CHOLHDL 2.4 09/23/2022     Atrial fibrillation, HFpEF/pulmonary hypertension, hypertension and aortic stenosis: He is under the care of Dr. Kirke Corin.  He denies tachycardia, palpitations. No chest pain or shortness of breath. He has had labile BP's, with significant white coat component, though this has improved. He is on blood thinners and denies bleeding, though his hemoglobin has dropped (and is scheduled for colonoscopy). He no longer takes furosemide, due to low BP's and syncope/orthostasis. Last echo was 11/2021 (mod AV stenosis)--Dr. Kirke Corin suspected gradual progression, felt like murmur suggested severe stenosis.  Did not think he was a good candidate for TAVR.  He reports feeling dizzy when standing quickly after sitting for a long time.  Feels better if he gets up slowly.    H/o Stroke (10/2014) and seizures. 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 His Keppra dose was decreased from 750 to 500 mg at last visit in June. ***CHANGES??  He remains on Eliquis for anticoagulation related to afib for prevention of further strokes.  He denies any bleeding, bruising (just related to falls) He  has had B12 deficiency, and is compliant with taking a supplement daily. Last level was normal in 04/2022. He last saw neurology in June 2024. Concern of progressive parkinsonism features on exam and per history and confirmed by [facility].  He didn't tolerate Sinimet due to diarrhea. He continues to have falls.  He has gotten home health PT, OT, working with him on balance, swallowing.     IFG--He doesn't check his sugars.  Last A1c was 6.1% in 08/2022, up from 5.6% in 08/2021. At April 2024 visit he reported eating a lot of sweets--ice cream, cakes, pies.  He has a small amount most days. He was only having 1 beer, up to 3x/week, but not drinking enough water.  ***HOW MUCH  WATER    H/o microalbuminuria, noted in 04/2017. Urine microalbumin/Cr ratio was up to 31 in 07/2019 (after lisinopril had been stopped). Last check was normal, ratio of 19, in 01/2022.     PMH, PSH, SH reviewed   ROS: no f/c, URI symptoms.  No chest pain, palpitations, shortness of breath.  No edema.  No bleeding, other than from cuts/wounds related to falls.  Dizziness, unsteadiness, nocturia.  Constipation and diarrhea? Denies dysuria, hematuria, no change in urinary symptoms. +nocturia See HPI   PHYSICAL EXAM:  There were no vitals taken for this visit.  Wt Readings from Last 3 Encounters:  02/26/23 135 lb (61.2 kg)  02/16/23 125 lb (56.7 kg)  12/02/22 130 lb 1.1 oz (59 kg)   Elderly, frail male, in no distress. HEENT: conjunctiva and sclera are clear, EOMI.  ***UPDATE SCAR from recent laceration  Skin:   Skin is a little dry. Normal turgor UPDATE *** Neck: no lymphadenopathy or mass Heart: loud holosystolic murmur, unchanged.  Fairly regular rhythm with intermittent skipped beats/pauses. Lung: clear bilaterally Back: no spinal or CVA tenderness Abdomen: soft, nontender, no mass Extremities: no edema Neuro: alert and oriented.  Normal speech  cranial nerves grossly intact. He has fairly flat appearance overall, but intermittently makes a joke/smiles. A little unsteady with standing.  UPDATE ALL, esp neuro, skin   ASSESSMENT/PLAN:  ADD URINE MICROALB--h/o microalbuminuria Ask pt if any dysuria, if urine is dark. May need u/a (has had UTI's and dehydration). Consider orthostatic BP's if BP low or reporting dizziness  Atorvastatin 20mg --someone from gastro d/c's this med on 8/12? No visit that date-- Is he still taking this? If so, please add back to med list.  Not sure if was an error on their part??   Has CPE scheduled for 08/2023

## 2023-03-14 ENCOUNTER — Encounter: Payer: Self-pay | Admitting: Family Medicine

## 2023-03-14 ENCOUNTER — Ambulatory Visit (INDEPENDENT_AMBULATORY_CARE_PROVIDER_SITE_OTHER): Payer: Medicare Other | Admitting: Family Medicine

## 2023-03-14 VITALS — BP 148/70 | HR 80 | Ht 71.0 in | Wt 126.6 lb

## 2023-03-14 DIAGNOSIS — D649 Anemia, unspecified: Secondary | ICD-10-CM | POA: Diagnosis not present

## 2023-03-14 DIAGNOSIS — E119 Type 2 diabetes mellitus without complications: Secondary | ICD-10-CM

## 2023-03-14 DIAGNOSIS — E7800 Pure hypercholesterolemia, unspecified: Secondary | ICD-10-CM

## 2023-03-14 DIAGNOSIS — R350 Frequency of micturition: Secondary | ICD-10-CM | POA: Diagnosis not present

## 2023-03-14 DIAGNOSIS — G40909 Epilepsy, unspecified, not intractable, without status epilepticus: Secondary | ICD-10-CM

## 2023-03-14 DIAGNOSIS — Z5181 Encounter for therapeutic drug level monitoring: Secondary | ICD-10-CM | POA: Diagnosis not present

## 2023-03-14 DIAGNOSIS — R8290 Unspecified abnormal findings in urine: Secondary | ICD-10-CM

## 2023-03-14 DIAGNOSIS — Z87440 Personal history of urinary (tract) infections: Secondary | ICD-10-CM

## 2023-03-14 DIAGNOSIS — Z7901 Long term (current) use of anticoagulants: Secondary | ICD-10-CM | POA: Diagnosis not present

## 2023-03-14 DIAGNOSIS — I10 Essential (primary) hypertension: Secondary | ICD-10-CM

## 2023-03-14 DIAGNOSIS — D509 Iron deficiency anemia, unspecified: Secondary | ICD-10-CM | POA: Diagnosis not present

## 2023-03-14 DIAGNOSIS — R296 Repeated falls: Secondary | ICD-10-CM

## 2023-03-14 DIAGNOSIS — Z23 Encounter for immunization: Secondary | ICD-10-CM | POA: Diagnosis not present

## 2023-03-14 DIAGNOSIS — R809 Proteinuria, unspecified: Secondary | ICD-10-CM

## 2023-03-14 DIAGNOSIS — I480 Paroxysmal atrial fibrillation: Secondary | ICD-10-CM

## 2023-03-14 LAB — POCT URINALYSIS DIP (PROADVANTAGE DEVICE)
Bilirubin, UA: NEGATIVE
Blood, UA: NEGATIVE
Glucose, UA: NEGATIVE mg/dL
Ketones, POC UA: NEGATIVE mg/dL
Nitrite, UA: POSITIVE — AB
Specific Gravity, Urine: 1.02
Urobilinogen, Ur: 0.2
pH, UA: 6 (ref 5.0–8.0)

## 2023-03-14 LAB — POCT GLYCOSYLATED HEMOGLOBIN (HGB A1C): Hemoglobin A1C: 5.8 % — AB (ref 4.0–5.6)

## 2023-03-14 NOTE — Patient Instructions (Addendum)
Please look at the medications at home to ensure that atorvastatin is in the packaged pills (It wasn't on the med list today--I think it was inadvertantly removed by a nurse back in August). If atorvastatin is NOT listed, please let us know.  Please make sure that you do your home exercises as recommended by the physical therapist.  This is important in order to maintain what they have worked so hard with you on--to keep you strong and prevent falls.  It sounds as though there was an incident with a MUCH longer recovery after a staring spell/seizure.  If this continues, you need to let the neurologist know, as they may want to increase the dose of keppra back to the 750 mg (the dose was lowered back in June).  Please cut back on the sweet tea--due to the caffeine (which dehydrates you), and the sugar. I'd much rather you drink an extra 3-4 glasses of water (or seltzer, unsweetened) every day..  Try and eat a high fiber diet--lots of fruit and vegetables. Try and get some regular activity (walking with walker)  We are going to wait and see what your labs show--if there are findings to suggest GI bleeding, then I recommend going ahead with the colonoscopy.

## 2023-03-15 LAB — CBC WITH DIFFERENTIAL/PLATELET
Basophils Absolute: 0 10*3/uL (ref 0.0–0.2)
Basos: 0 %
EOS (ABSOLUTE): 0.2 10*3/uL (ref 0.0–0.4)
Eos: 2 %
Hematocrit: 30.3 % — ABNORMAL LOW (ref 37.5–51.0)
Hemoglobin: 9.7 g/dL — ABNORMAL LOW (ref 13.0–17.7)
Immature Grans (Abs): 0 10*3/uL (ref 0.0–0.1)
Immature Granulocytes: 0 %
Lymphocytes Absolute: 1.8 10*3/uL (ref 0.7–3.1)
Lymphs: 22 %
MCH: 31.1 pg (ref 26.6–33.0)
MCHC: 32 g/dL (ref 31.5–35.7)
MCV: 97 fL (ref 79–97)
Monocytes Absolute: 1.1 10*3/uL — ABNORMAL HIGH (ref 0.1–0.9)
Monocytes: 13 %
Neutrophils Absolute: 5.1 10*3/uL (ref 1.4–7.0)
Neutrophils: 63 %
Platelets: 292 10*3/uL (ref 150–450)
RBC: 3.12 x10E6/uL — ABNORMAL LOW (ref 4.14–5.80)
RDW: 12.3 % (ref 11.6–15.4)
WBC: 8.2 10*3/uL (ref 3.4–10.8)

## 2023-03-15 LAB — MICROALBUMIN / CREATININE URINE RATIO
Creatinine, Urine: 127.9 mg/dL
Microalb/Creat Ratio: 56 mg/g{creat} — ABNORMAL HIGH (ref 0–29)
Microalbumin, Urine: 71.8 ug/mL

## 2023-03-15 LAB — CMP14+EGFR
ALT: 9 [IU]/L (ref 0–44)
AST: 15 [IU]/L (ref 0–40)
Albumin: 3.9 g/dL (ref 3.7–4.7)
Alkaline Phosphatase: 60 [IU]/L (ref 44–121)
BUN/Creatinine Ratio: 24 (ref 10–24)
BUN: 26 mg/dL (ref 8–27)
Bilirubin Total: 0.4 mg/dL (ref 0.0–1.2)
CO2: 23 mmol/L (ref 20–29)
Calcium: 9 mg/dL (ref 8.6–10.2)
Chloride: 105 mmol/L (ref 96–106)
Creatinine, Ser: 1.08 mg/dL (ref 0.76–1.27)
Globulin, Total: 2.7 g/dL (ref 1.5–4.5)
Glucose: 109 mg/dL — ABNORMAL HIGH (ref 70–99)
Potassium: 4.9 mmol/L (ref 3.5–5.2)
Sodium: 143 mmol/L (ref 134–144)
Total Protein: 6.6 g/dL (ref 6.0–8.5)
eGFR: 66 mL/min/{1.73_m2} (ref >59)

## 2023-03-16 LAB — IRON AND TIBC
Iron Saturation: 7 % — CL (ref 15–55)
Iron: 19 ug/dL — ABNORMAL LOW (ref 38–169)
Total Iron Binding Capacity: 263 ug/dL (ref 250–450)
UIBC: 244 ug/dL (ref 111–343)

## 2023-03-16 LAB — B12 AND FOLATE PANEL
Folate: 8.7 ng/mL (ref >3.0)
Vitamin B-12: 1221 pg/mL (ref 232–1245)

## 2023-03-16 LAB — RETICULOCYTES: Retic Ct Pct: 1 % (ref 0.6–2.6)

## 2023-03-16 LAB — FERRITIN: Ferritin: 63 ng/mL (ref 30–400)

## 2023-03-16 LAB — SPECIMEN STATUS REPORT

## 2023-03-17 DIAGNOSIS — H353231 Exudative age-related macular degeneration, bilateral, with active choroidal neovascularization: Secondary | ICD-10-CM | POA: Diagnosis not present

## 2023-03-17 DIAGNOSIS — Z961 Presence of intraocular lens: Secondary | ICD-10-CM | POA: Diagnosis not present

## 2023-03-17 NOTE — Telephone Encounter (Signed)
Clinical pharmacists have reviewed the patient's past medical history, labs, and current medications as part of preoperative protocol coverage. The following recommendations have been made:   Per office protocol, patient can hold Eliquis for 1 day prior to procedure.      I will route this recommendation to the requesting party via Epic fax function and remove from pre-op pool.   Please call with questions.   Napoleon Form, Leodis Rains, NP 03/01/2023, 12:28 PM

## 2023-03-18 ENCOUNTER — Telehealth: Payer: Self-pay | Admitting: Family Medicine

## 2023-03-18 ENCOUNTER — Telehealth: Payer: Self-pay

## 2023-03-18 DIAGNOSIS — N390 Urinary tract infection, site not specified: Secondary | ICD-10-CM

## 2023-03-18 LAB — CULTURE, URINE COMPREHENSIVE

## 2023-03-18 MED ORDER — SULFAMETHOXAZOLE-TRIMETHOPRIM 800-160 MG PO TABS: 1.0000 | ORAL_TABLET | Freq: Two times a day (BID) | ORAL | 0 refills | Status: DC

## 2023-03-18 NOTE — Telephone Encounter (Signed)
Daughter French Ana is aware of Eliquis hold... ok per [facility]

## 2023-03-18 NOTE — Telephone Encounter (Signed)
Daughter French Ana called regarding pt's recent iron studies... She wanted to make sure there was not anything other than the colonoscopy scheduled for next week that can be done to look into this and his Sx.... please advise

## 2023-03-18 NOTE — Telephone Encounter (Signed)
Advise Jeffrey [name] that antibiotic was sent to pharmacy. His results just came back.

## 2023-03-18 NOTE — Telephone Encounter (Signed)
Pt's daughter called about urine results, wants to get him started on antibiotic ASAP  Pt scheduled for colonoscopy next week   Pt very fragile and sleeping a lot    [facility]   Please call French Ana    Routing to [place] and [employer]

## 2023-03-19 ENCOUNTER — Emergency Department
Admission: EM | Admit: 2023-03-19 | Discharge: 2023-03-19 | Disposition: A | Discharge status: Home / Self Care | Payer: Medicare Other | Attending: Emergency Medicine | Admitting: Emergency Medicine

## 2023-03-19 ENCOUNTER — Other Ambulatory Visit: Payer: Self-pay

## 2023-03-19 ENCOUNTER — Emergency Department: Payer: Medicare Other

## 2023-03-19 DIAGNOSIS — W01198A Fall on same level from slipping, tripping and stumbling with subsequent striking against other object, initial encounter: Secondary | ICD-10-CM | POA: Diagnosis not present

## 2023-03-19 DIAGNOSIS — I10 Essential (primary) hypertension: Secondary | ICD-10-CM | POA: Diagnosis not present

## 2023-03-19 DIAGNOSIS — W19XXXA Unspecified fall, initial encounter: Secondary | ICD-10-CM

## 2023-03-19 DIAGNOSIS — K627 Radiation proctitis: Secondary | ICD-10-CM | POA: Diagnosis not present

## 2023-03-19 DIAGNOSIS — I4891 Unspecified atrial fibrillation: Secondary | ICD-10-CM | POA: Insufficient documentation

## 2023-03-19 DIAGNOSIS — S0101XA Laceration without foreign body of scalp, initial encounter: Secondary | ICD-10-CM | POA: Diagnosis not present

## 2023-03-19 DIAGNOSIS — E119 Type 2 diabetes mellitus without complications: Secondary | ICD-10-CM | POA: Diagnosis not present

## 2023-03-19 DIAGNOSIS — S199XXA Unspecified injury of neck, initial encounter: Secondary | ICD-10-CM | POA: Diagnosis not present

## 2023-03-19 DIAGNOSIS — R58 Hemorrhage, not elsewhere classified: Secondary | ICD-10-CM | POA: Diagnosis not present

## 2023-03-19 DIAGNOSIS — R55 Syncope and collapse: Secondary | ICD-10-CM | POA: Diagnosis not present

## 2023-03-19 DIAGNOSIS — S0990XA Unspecified injury of head, initial encounter: Secondary | ICD-10-CM | POA: Diagnosis not present

## 2023-03-19 NOTE — ED Provider Notes (Signed)
[facility] Provider Note    Event Date/Time   First MD Initiated Contact with Patient 03/19/23 1735     (approximate)   History   Fall   HPI  Jeffrey [name] is a 89 y.o. male with PMH of HTN, diabetes, seizures, TIA, A-fib, aortic atherosclerosis presents for evaluation after a mechanical fall earlier today.  Patient slipped and hit the back of his head.  He denies LOC.  He does take Eliquis.  He denies any complaints at this time and states he is ready to go home.      Physical Exam   Triage Vital Signs: ED Triage Vitals  Encounter Vitals Group     BP 03/19/23 1627 (!) 146/76     Systolic BP Percentile --      Diastolic BP Percentile --      Pulse Rate 03/19/23 1627 78     Resp 03/19/23 1627 16     Temp 03/19/23 1627 98.3 F (36.8 C)     Temp Source 03/19/23 1627 Oral     SpO2 03/19/23 1627 100 %     Weight 03/19/23 1626 120 lb 9.5 oz (54.7 kg)     Height 03/19/23 1626 5\' 11"  (1.803 m)     Head Circumference --      Peak Flow --      Pain Score 03/19/23 1625 2     Pain Loc --      Pain Education --      Exclude from Growth Chart --     Most recent vital signs: Vitals:   03/19/23 1627  BP: (!) 146/76  Pulse: 78  Resp: 16  Temp: 98.3 F (36.8 C)  SpO2: 100%    General: Awake, no distress.  CV:  Good peripheral perfusion.  RRR, 4/6 aortic stenosis murmur Resp:  Normal effort.  CTAB. Abd:  No distention.  Other:  Hematoma to the left posterior aspect of the head with 2 small lacerations, well-approximated and bleeding controlled, tender to palpation.  PERRL.  EOM intact.  No focal neurodeficits.   ED Results / Procedures / Treatments   Labs (all labs ordered are listed, but only abnormal results are displayed) Labs Reviewed - No data to display   RADIOLOGY  CT head and cervical spine obtained, I interpreted the images as well as reviewed the radiologist report.  Images do not show any acute intracranial abnormalities and  there are no fractures of the cervical spine.   PROCEDURES:  Critical Care performed: No  ..Laceration Repair  Date/Time: 03/19/2023 5:55 PM  Performed by: Cameron Ali, PA-C Authorized by: Cameron Ali, PA-C   Consent:    Consent obtained:  Verbal   Consent given by:  Patient   Risks, benefits, and alternatives were discussed: yes     Risks discussed:  Infection, pain, poor cosmetic result and poor wound healing Universal protocol:    Patient identity confirmed:  Verbally with patient Laceration details:    Location:  Scalp   Scalp location:  Occipital   Length (cm):  2 Exploration:    Hemostasis achieved with:  Direct pressure Treatment:    Irrigation solution:  Sterile saline   Irrigation method:  Syringe Approximation:    Approximation:  Loose Post-procedure details:    Dressing:  Bulky dressing    MEDICATIONS ORDERED IN ED: Medications - No data to display   IMPRESSION / MDM / ASSESSMENT AND PLAN / ED COURSE  I reviewed the  triage vital signs and the nursing notes.                             89 year old male presents for evaluation of head injury after fall.  Patient was hypertensive in triage but does have a history of hypertension, VSS otherwise. Patient is NAD on exam.  Differential diagnosis includes, but is not limited to, skull fracture, intracranial hemorrhage, laceration, abrasion.  Patient's presentation is most consistent with acute complicated illness / injury requiring diagnostic workup.  I recommended staples to close patient's head lacerations which he declined at this time.  We discussed risks of this including poor wound healing, bleeding and infection.  He was willing to accept these.  Wound was cleaned with saline and bulky dressing applied as described in the procedure note above.  Patient was instructed on wound care.  Given patient's negative imaging as well as lack of any symptoms, I feel he is safe for discharge  Patient  was agreeable to plan, voiced understanding and was stable at discharge.      FINAL CLINICAL IMPRESSION(S) / ED DIAGNOSES   Final diagnoses:  Fall, initial encounter  Laceration of scalp, initial encounter     Rx / DC Orders   ED Discharge Orders     None        Note:  This document was prepared using Dragon voice recognition software and may include unintentional dictation errors.   Cameron Ali, PA-C 03/19/23 1926    Trinna Post, MD 03/21/23 [phone]

## 2023-03-19 NOTE — ED Triage Notes (Signed)
First Nurse Note:  Pt via [facility] from home. Pt c/o mechanical fall, pt was not walking with his walker. Pt abrasion to the L posterior aspect of the head. Pt is Eliquis. Denies LOC. Pt is A&Ox4 and NAD 147/81 BP  116 CBG  86 HR  100% on RA

## 2023-03-19 NOTE — Discharge Instructions (Signed)
Keep the bandage on your head in place for 24 hours, after that you can take it off and wash the areas with soap and water.  Watch for signs of infection including redness, warmth, swelling, pain and pus drainage.  If you develop any of these please return to the ED, urgent care or your primary care provider.

## 2023-03-19 NOTE — ED Notes (Signed)
Pt given urinal- states he is ready to go home.

## 2023-03-19 NOTE — ED Triage Notes (Addendum)
Slipped and fell at home.  Two abrasion to left posterior head.  No LOC.  ABrasion to left elbow. Bleeding controlled.

## 2023-03-21 ENCOUNTER — Telehealth: Payer: Self-pay | Admitting: Gastroenterology

## 2023-03-21 NOTE — Telephone Encounter (Signed)
EGD added.

## 2023-03-21 NOTE — Telephone Encounter (Signed)
Patient Daughter French Ana) called in to cancel her father procedure. They said that they will call back to reschedule it.

## 2023-03-22 ENCOUNTER — Encounter: Payer: Self-pay | Admitting: Family Medicine

## 2023-03-22 NOTE — Telephone Encounter (Signed)
Procedure cancelled.

## 2023-03-24 ENCOUNTER — Ambulatory Visit: Admission: RE | Admit: 2023-03-24 | Payer: Medicare Other | Source: Home / Self Care | Admitting: Gastroenterology

## 2023-03-24 SURGERY — COLONOSCOPY WITH PROPOFOL
Anesthesia: General

## 2023-03-24 NOTE — Plan of Care (Signed)
[facility] Tonsillectomy/Adenoidectomy, Postoperative PEDS care plan entered in error.

## 2023-05-10 ENCOUNTER — Other Ambulatory Visit: Payer: Self-pay | Admitting: Neurology

## 2023-06-07 ENCOUNTER — Other Ambulatory Visit: Payer: Self-pay

## 2023-06-07 MED ORDER — CARVEDILOL 6.25 MG PO TABS: 6.2500 mg | ORAL_TABLET | Freq: Two times a day (BID) | ORAL | 0 refills | Status: DC

## 2023-06-07 NOTE — Telephone Encounter (Signed)
 This is a Educational psychologist pt

## 2023-06-08 ENCOUNTER — Telehealth: Payer: Self-pay | Admitting: Cardiovascular Disease

## 2023-06-08 NOTE — Telephone Encounter (Signed)
-----   Message from [facility] [place] sent at 06/07/2023  1:35 PM EST ----- Please contact patient for a follow up with Dr. Kirke Corin. Thanks, Jasmine December

## 2023-06-08 NOTE — Telephone Encounter (Signed)
 LVM to schedule f/u appt

## 2023-06-09 DIAGNOSIS — Z961 Presence of intraocular lens: Secondary | ICD-10-CM | POA: Diagnosis not present

## 2023-06-09 DIAGNOSIS — H353231 Exudative age-related macular degeneration, bilateral, with active choroidal neovascularization: Secondary | ICD-10-CM | POA: Diagnosis not present

## 2023-06-20 ENCOUNTER — Ambulatory Visit (INDEPENDENT_AMBULATORY_CARE_PROVIDER_SITE_OTHER): Payer: Medicare Other | Admitting: Neurology

## 2023-06-20 ENCOUNTER — Ambulatory Visit: Payer: Medicare Other | Admitting: Neurology

## 2023-06-20 ENCOUNTER — Encounter: Payer: Self-pay | Admitting: Neurology

## 2023-06-20 VITALS — BP 158/84 | HR 75 | Ht 71.0 in | Wt 141.0 lb

## 2023-06-20 DIAGNOSIS — G20C Parkinsonism, unspecified: Secondary | ICD-10-CM

## 2023-06-20 DIAGNOSIS — Z8669 Personal history of other diseases of the nervous system and sense organs: Secondary | ICD-10-CM | POA: Diagnosis not present

## 2023-06-20 DIAGNOSIS — R269 Unspecified abnormalities of gait and mobility: Secondary | ICD-10-CM

## 2023-06-20 DIAGNOSIS — R296 Repeated falls: Secondary | ICD-10-CM

## 2023-06-20 NOTE — Progress Notes (Signed)
[facility] [address] [place]. [place] [address] [phone]       OFFICE FOLLOW UP NOTE  Jeffrey [name] Date of Birth:  29-Mar-1933 Medical Record Number:  [id number]   Referring MD:   Jeffrey [name] Reason for visit: Seizures [facility] provider: Dr. Pearlean [name]   Chief Complaint  Patient presents with   Follow-up    Rm 20  with spouse and daughter  Pt is well, reports he has had more staring spells since last viist but cannot recall how many.  He also reports worsening in tremor in hands       HPI:   Initial visit 05/02/2018 PS: Jeffrey [name] is a pleasant 89 year old Caucasian male was seen today for initial office consultation visit.  He is accompanied by his wife and daughter.  History is obtained from them and review of referral notes.  I have reviewed imaging films in PACS.  He has been having multiple brief episodes of altered awareness for several years but these appear to have increased in the last 6 months and frequency as well as duration.  He had a prolonged episode recently in November 2019 when he went to the ER.  CT scan of the head was obtained which I reviewed showed no acute abnormality except changes of age-related small vessel disease.  He is recently had a few back-to-back episodes as well.  He is unable to identify specific trigger for his episodes.  He is usually staring during these episodes and does not respond to commands.  He does not lose consciousness fall or hurt himself.  His episodes were previously lasting less than a minute the dose of few recent ones have been more prolonged.  Following some of these episodes he tends to get agitated and disoriented.  The patient's daughter feels she is noticed that he does some automatic hand movements and some of these episodes.  He denies any headache or any aura prior to these episodes.  He has no remote history of head injury with loss of consciousness intracranial hemorrhage or strokes.  There is no family  history of epilepsy or seizures.  There is no specific pattern for these episodes which may occur once every few months to several in a week.  He has not had an EEG or MRI scan done or a trial of seizure medications. He had prior history of right anterior frontal lacunar infarct in 11/27/2014 he was felt to have asymptomatic 80 to 90% left ICA stenosis at that time.  Echocardiogram was normal.  He has remote history of [facility] spotted fever with encephalitis.  He was treated with a 2-week course of doxycycline.  He did not have any definite seizures at that time.  He had episode of brief staring with a blank look on his face in May 2016 and was admitted to [facility] where he had an EEG which was negative for seizures.  He was seen by Jeffrey [name] neurologist as an outpatient and had a negative work-up for his seizures as well.  He was referred to Jeffrey [name] feels vascular surgeon for left carotid stenosis follow-up with ultrasound in the office showed stenosis to be much less and hence conservative follow-up was recommended.  He was seen by me in the office in March 2017 but has not followed up since then.  He was found to subsequently have a transient episode of paroxysmal A. fib and was started on Eliquis by cardiologist Jeffrey [name].  He  also had a second EEG done in September 2016 which was also unremarkable except for mild generalized slowing.  Update 08/07/2018 PS : He returns for follow-up after last visit 3 months ago.  He is accompanied by his wife.  He states he is doing well.  Is tolerating Keppra XR 500 mg quite well without any side effects.  He has had no spells like seizures or speech difficulties since the last visit.  He has had no stroke or TIA symptoms either.  He underwent EEG on 06/12/2018 which was read by me and was normal.  MRI scan of the brain done on 06/14/2018 which I have personally reviewed the images shows tiny right posterior temporal white matter lacunar  infarct which was clinically silent as patient had no symptoms at that time.  Carotid ultrasound previously done on 12/23/2017 had shown 40 to 59% left ICA stenosis no significant stenosis on the right.  Transthoracic echo done on 04/05/2018 and showed normal ejection fraction.  Lipid profile done on 07/05/2018 was unremarkable with LDL cholesterol of 66 mg percent, HDL of 59 and total cholesterol 152 mg percent.  Hemoglobin A1c on 06/28/2018 was 6.0.  Patient has been diagnosed with sleep apnea by his pulmonologist Dr Bard [name] and has started using CPAP every night recently.  He has no new complaints today.  He states his tremors are unchanged and are not functionally disabling.  He does follow-up with Dr. Darrick Penna from vascular surgery for carotid stenosis and will have follow-up carotid ultrasound done in upcoming visit in the spring  Update 02/20/2019 Jeffrey [name]: Jeffrey [name] is a 89 year old male who is being seen today for follow-up accompanied by wife.  He has been doing well from a neurological standpoint.  He continues on Keppra XR 500 mg daily without recurrent seizure episodes or speech difficulties.  He did have a ED visit on 12/29/2018 for syncopal episode which was felt likely due to orthostatic hypotension with cardiology follow-up and medications adjusted.  Syncopal episode was not felt to be related to seizures.  Denies new or recurring stroke/TIA symptoms as well.  Upper extremity L>R tremors have been stable and do not interfere with daily functioning.  He remains on Eliquis without bleeding or bruising.  He endorses ongoing compliance with CPAP for OSA management.  Blood pressure today elevated at 189/90 but does endorse elevation at doctor's appointments.  He does monitor at home and typically 120s/70s.  Denies new or worsening stroke/TIA symptoms.   Update 09/03/2019 Dr. Pearlean [name]: He returns for follow-up after last visit 6 months ago.  He states he is doing well but wife states that a few weeks ago she noticed  episode of transient unresponsiveness where he was staring ahead and not immediately responsive this lasted only a few moments and then returned back to baseline but patient did not remember that.  There were no automatic movements or lipsmacking noted.  There is no confusion or disorientation or headache.  There is no apparent trigger.  He remains on Keppra Exar 500 mg daily and is tolerating well without side effects.  He remains on Eliquis for his A. fib and has not had any stroke or TIA symptoms.  His blood pressure is significantly elevated today with the patient's wife claims that this on whitecoat hypertension as it is much better at home.  He continues to have intermittent hand tremor but this is not functionally disabling.  He has no other new complaints.   Update 03/05/2020 Dr. Pearlean [name]: He returns for  follow-up after last visit 6 months ago.  Is accompanied by his wife.  Wife feels that these episodes of transient dizziness and unresponsiveness and staring ahead is still occurring but she is not sure how frequently is happening.  She gets this is probably once a month or so.  He has tolerated increasing the dose of Keppra XR 2000 mg daily without side effects.  He continues to have mild tremors in his hands and head but these are not interfering with day-to-day activities and remain unchanged.  Is tolerating Eliquis well without bruising or bleeding.  He had a repeat EEG done on 11/15/2019 which was normal.  He has no new complaints.   Update 09/03/2020 Jeffrey [name]: Returns for 50-month follow-up.  Stable since prior visit without any reoccurring seizures or symptoms.  Remains on Keppra XR but per patient report, only taking 1 tablet at night. At prior visit, reported taking 2 tab for 1000mg  dosing but he denies ever taking 2 tablets at night.  He is tolerating current dose of Keppra without side effects.  Chronic mild upper extremity tremor with mild progression but does not interfer with daily activity. Hx of A.  fib on Eliquis routinely monitored by cardiology.  No concerns at this time.   Update 03/09/2021 Jeffrey [name]: Returns for 48-month follow-up accompanied by his wife.  ED eval 11/12/2020 for unresponsive episode with eyes open, staring and altered awareness after mowing his grass. Noted to have taken his initial dose of silodosin for his BPH that same day.  Event similar to his prior episodes.   Reports occasional feeling of "swimmy headedness" when stands too quickly and with increased exposure to heat/sunshine. Per wife, he had an additional event (similar to the one in June) with sensation of dizziness/lightheadedness, impairment of consciousness with unresponsive, and staring.  He does not remember these episodes occurring.  Lasting only short duration.  Denies actual loss of consciousness or passing out, falling, tongue biting, weakness, or autonomic movements. Wife and son able to assist pt back into house and after sitting and drinking water, shortly returned back to baseline.  He will occasionally feel lightheadedness not associated with staring or altered consciousness episodes.  He does admit to limited fluid intake drinking approximately 4-8 oz of water daily. Admits to 1-2 beers/day and occasionally a mixed drink (states "it doesn't make my doctor happy but it makes me happy").   Reports compliance on Keppra XR 500 mg nightly - denies any missed dosages.  He was previously advised to increase dose after similar episode occurred in 07/2019 but this never occurred. When seen on 09/03/2020, he denied any additional events in almost 1 year therefore remained on keppra XR 500mg  dosing.   Tremor stable noting improvement since prior visit and can wax/wane.  Does not interfere with daily activity or functioning.  Occasionally can be worse at night - unable to identify trigger.  Recently started on B12 supplement for B12 deficiency.  Reports compliance on Eliquis for secondary stroke prevention measures and history of  A. fib managed by cardiology.  Routinely followed by PCP.  No further concerns at this time.    Update 07/20/2021 Jeffrey [name]: 89 year old male with history of complex partial seizures and tremor.  He is accompanied by his wife. Stable from seizure standpoint, currently taking Keppra XR 750 mg daily without side effects. No additional seizure activity since dosage increase after prior visit.  Continued tremor noting good days/bad days and can be worse at night. He did have a fall  on 2/10 with c/o left shoulder and arm pain - does experience increased shoulder and arm pain at night. Routinely followed by orthopedics receiving bilateral shoulder injections but has not yet been seen since fall. He was evaluated in ED post fall - left shoulder and arm xray which was negative for fracture, CTH unremarkable, CT cervical no acute findings. Otherwise tremors relatively stable, does not interfere with daily activity or functioning. He also mentions over the past 6 months, experiencing sensation of a sock still on his right foot despite being barefoot typically more around his toes. Denies any worsening since onset and denies any painful symptoms.  Denies back pain or traumatic event preceding symptoms. Was started on B12 supplement a couple months ago for B12 deficiency. Hx of prediabetes but otherwise no history of neuropathy or family history of neuropathy.  Of note, blood pressure elevated today but routinely monitors at home and typically stable.  No further concerns at this time.  Update 02/03/2022 Jeffrey [name]: Patient returns for 92-month follow-up accompanied by his wife and daughter  remains on Keppra XR 750 mg daily, denies side effects. Wife reports where he may not be verbally responsive for a few seconds, seems to happen more consistently when standing after sitting for prolonged period of time.  Denies any changes in his speech during these episodes.  He has been having issues with low blood pressure and syncopal events,  blood pressure is not checked during these times.  He was seen in the ED 8/12 after syncopal event which was felt to be heat related with possible orthostatic syncope.  He has not yet had follow-up with cardiology since his event as he was seen by them 8/11.  Admits to limited water intake (<4oz of water/day). Daughter reports weight loss and questions if this could be contributing to low blood pressure.  Does drink Ensure during the day.  Reports continued right foot toe numbness/discomfort, can feel like he is still wearing a tight sock when barefoot.  Denies any progression since prior visit.  Remains on B12 supplement.  Feels like tremors have slightly worsened since prior visit but does not interfere with daily activity or functioning  No further concerns at this time    Update 08/10/2022 Jeffrey [name]: Patient returns for 16-month follow-up accompanied by his wife.  Compliant on Keppra XR 500 mg daily, no recent seizure activity, denies side effects.   Believes tremors have continued to gradually worsen since prior visit.  Has been more noticeable at rest but also occur with activity, present in both hands and jaw.  Denies tremors in legs. Feels like he isn't as steady as he used to be. Wife does report some acting out in his sleep. Sleeps well for [name] part but does have frequent nocturia.  Has issues with constipation. Some swallowing issues with harder/chewy food or water. Some cognitive issues.  Routinely follows with ophthalmology for macular degeneration receiving injections every 10 weeks.  Denies any freezing with ambulation or any recent falls.   Update 11/25/2022 : He returns for follow-up after last visit 3 months ago.  He is accompanied by his daughter.  He has discontinued Sinemet as he was having increasing visual hallucinations and falling a lot and he was taking double the dose which was causing side effects.  Despite stopping Sinemet for now more than 2 weeks he is continues to have visual  hallucinations.  He sees lots of people.  This is not distressing.  He notes it is not real.  Patient continues to have mild tremor but it is not disabling and does not want to try medication at this time.  He is currently working with physical and speech therapy to improve his balance and swallowing.  He has not had any seizures and is wondering if his Keppra dose can be reduced since he has recently lost a lot of weight.  He had a DATscan on 4/24 which showed near absent radiotracer activity within bilateral Putamen and decreased activity in the head on the right caudate in a pattern suggestive of parkinsonian syndrome  Update 06/20/2023 : He returns for follow-up after last visit more than 6 months ago.  He is accompanied by his wife and daughter.  Patient had complained of increasing hallucinations on Sinemet and it was discontinued and that seems to have improved.  He continues to have intermittent tremor and some gait and balance difficulties.  Particularly he has trouble standing up and getting started while walking.  He did have multiple falls and visits to the ER in September and October 2024.  CT scan of the head done on those occasions showed no acute abnormality.  He is doing better now and is consistently using a walker and has not had no falls.  He got some physical therapy which also seem to have helped.  He denies significant drooling of saliva.  He is tolerating Eliquis 2.5 twice daily without bleeding and only minor bruising.  He has had no seizures and he remains on Keppra XR 5oo mg daily which is tolerating well without side effects.  Patient is worried about his parkinsonian symptoms and whether he can try any other medications and would like opinion from movement disorder specialist  ROS:   14 system review of systems is positive for those listed in HPI denies and all other systems negative  PMH:  Past Medical History:  Diagnosis Date   Acute CVA (cerebrovascular accident) (HCC)     Acute encephalopathy 11/27/2014   Aortic atherosclerosis (HCC) 11/25/2014   Atrial fibrillation (HCC)    a. 12/2018 [facility]io: Sinus rhythm, avg rate 71. 2 runs of VT (max 17 beats). 27 runs SVT (max 14 beats). Occas PACs/PVCs. No high grade AV block/pauses.   Chronic combined systolic (congestive) and diastolic (congestive) heart failure (HCC)    a. 10/2014 Echo: EF 40-45%; b. 03/2018 Echo: EF 55-60%; c. 12/2018 Echo: EF 55-60%, mild conc LVH. Nl RV fxn. RVSP 51.51mmHg. Mildly dil LA. Mod AS w/ severe Ca2+; d. 12/2019 Echo: EF 50-55%, mild LVH, gr2 DD. Sev elev PASP. Mild Jeffrey, mild to mod TR. Triv AI, mod to sev AS (area 0.77cm^2, mean grad . Vmax 2.81m/s).   Diabetes mellitus    Hypercholesteremia    Hypertension    Impaired fasting glucose    Macular degeneration    Moderate to severe aortic stenosis    a. 12/2018 Echo: EF 55-60%, Mod AS w/ sev AoV Ca2+; b. 12/2019 Echo: EF 50-55%. Mod to sev AS (area 0.77cm^2, mean grad . Vmax 2.59m/s).   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 01/15/2014    Social History:  Social History   Socioeconomic History   Marital status: Married    Spouse name: Fleet Contras   Number of children: 4   Years of education: Not on file   Highest education level: Not on file  Occupational History   Occupation: Retired  Tobacco Use   Smoking status: Former    Current packs/day: 0.00    Average packs/day: 1 pack/day for 20.0 years (20.0 ttl pk-yrs)    Types:  Cigarettes    Start date: 05/31/1960    Quit date: 05/31/1980    Years since quitting: 43.0   Smokeless tobacco: Never  Vaping Use   Vaping status: Never Used  Substance and Sexual Activity   Alcohol use: Not Currently    Alcohol/week: 3.0 standard drinks of alcohol    Types: 3 Cans of beer per week    Comment: e beer 3x/week   Drug use: No   Sexual activity: Not Currently    Partners: Female    Comment: issues with ED  Other Topics Concern   Not on file  Social History Narrative   Married. Retired Emergency planning/management officer.  2 sons, 2 daughters (all in [place]), 15+ grandchildren, 9 great grandchildren   Lives with wife, 1 dog.   Right Handed   Drinks 2 cups of caffeine daily      Updated 08/2022   Social Drivers of Health   Financial Resource Strain: Low Risk  (05/07/2021)   Overall Financial Resource Strain (CARDIA)    Difficulty of Paying Living Expenses: Not very hard  Food Insecurity: Not on file  Transportation Needs: No Transportation Needs (05/07/2021)   PRAPARE - Administrator, Civil Service (Medical): No    Lack of Transportation (Non-Medical): No  Physical Activity: Not on file  Stress: Not on file  Social Connections: Not on file  Intimate Partner Violence: Not on file    Medications:   Current Outpatient Medications on File Prior to Visit  Medication Sig Dispense Refill   acetaminophen (TYLENOL) 500 MG tablet Take 500 mg by mouth 2 (two) times daily.     apixaban (ELIQUIS) 2.5 MG TABS tablet Take 1 tablet (2.5 mg total) by mouth 2 (two) times daily. 60 tablet 11   atorvastatin (LIPITOR) 20 MG tablet Take 20 mg by mouth daily.     carvedilol (COREG) 6.25 MG tablet Take 1 tablet (6.25 mg total) by mouth 2 (two) times daily. 60 tablet 0   Cyanocobalamin (VITAMIN B 12 PO) Take 1,000 mcg by mouth.     ipratropium (ATROVENT) 0.03 % nasal spray Place 2 sprays into both nostrils 3 (three) times daily as needed for rhinitis. 30 mL 12   levETIRAcetam (KEPPRA XR) 500 MG 24  hr tablet TAKE ONE TABLET BY MOUTH EVERY DAY 30 tablet 5   loperamide (IMODIUM) 2 MG capsule Take 2 mg by mouth as needed for diarrhea or loose stools.     loratadine (CLARITIN) 10 MG tablet Take by mouth.     mirabegron ER (MYRBETRIQ) 50 MG TB24 tablet Take 1 tablet (50 mg total) by mouth daily. 90 tablet 3   Multiple Vitamins-Minerals (PRESERVISION AREDS) CAPS Take 1 capsule by mouth 2 (two) times daily.      sulfamethoxazole-trimethoprim (BACTRIM DS) 800-160 MG tablet Take 1 tablet by mouth 2 (two) times daily. 14 tablet 0   UNABLE TO FIND Bevacizumab(AVASTIN) chemo injection for  the eye every 10 weeks     No current facility-administered medications on file prior to visit.    Allergies:  No Known Allergies  Physical Exam  Today's Vitals   06/20/23 1039 06/20/23 1045  BP: (!) 162/86 (!) 158/84  Pulse: 75 75  Weight: 141 lb (64 kg)   Height: 5\' 11"  (1.803 m)      Body mass index is 19.67 kg/m.  General: Very Pleasant frail elderly Caucasian male seated, in no evident distress Head: head normocephalic and atraumatic.   Cardiovascular: regular rate and rhythm Musculoskeletal: limited R>L shoulder ROM (chronic) Skin:  no rash/petichiae Vascular:  Normal pulses all extremities  Neurologic Exam Mental Status: Awake and fully alert.  Mild hypophonia but no evidence of dysarthria or aphasia.  Mild facial masking noted.  Oriented to place and time. Recent and remote memory intact. Attention span, concentration and fund of knowledge appropriate. Mood and affect appropriate.   Cranial Nerves: Pupils equal, briskly reactive to light. Extraocular movements full without nystagmus. Visual fields full to confrontation.  HOH bilaterally. Facial sensation intact. Face, tongue, palate moves normally and symmetrically.  Motor: Normal strength in all tested extremities.  Intermittent resting tremor of LUE>RUE.  Slight postural tremor.  Increased tone with cogwheel rigidity RUE>LUE.  Decreased  finger tapping and foot tapping bilaterally. Sensory.: intact to touch , pinprick , position and vibratory sensation.  Gait and Station: Arises from chair without difficulty. Stance is stooped. Gait demonstrates slightly decreased stride length and step height bilaterally with decreased left arm swing and pill-rolling tremor. Reflexes: 1+ and symmetric. Toes downgoing.       ASSESSMENT/PLAN: 89 year long-standing recurrent transient stereotypical episodes of altered awareness with speech difficulties and starring possibly complex partial seizures.  Concern of progressive parkinsonism features on exam and per history and confirmed by DaTscan.  Patient was unable to tolerate Sinemet    I had a long discussion with the patient, his wife and daughter regarding his parkinsonian symptoms and frequent falls, remote history of stroke and seizures and answered questions.  Patient was unable to tolerate Sinemet due to significant hallucinations and side effects.  I recommend referral to Dr. Frances Furbish movement disorder specialist to discuss other treatment options since he is still sensitive to medications.  Encouraged him to use his walker at all times and we discussed fall safety precautions.  Continue Eliquis for stroke prevention for his A-fib with strict control of risk factors with blood pressure goal below 130/90, lipids with LDL cholesterol goal below 70 mg percent and diabetes with hemoglobin A1c goal below 6.5%.  Continue Keppra in the current dose of 500 mg daily for seizure prophylaxis.  Return for follow-up in the future in 6 months with nurse practitioner or call earlier if necessary.     I spent 40 minutes of face-to-face and non-face-to-face time with patient and wife.  This included previsit chart review, lab review, study review, order entry, electronic health record documentation, patient and wife education and discussion regarding above diagnoses and treatment plan and answered all other  questions to patient wife satisfaction  Delia Heady, MD  [facility] [address] [place], [place] [address]  Phone [phone] Fax [phone] Note: This document was prepared with digital dictation and possible smart phrase technology. Any transcriptional errors that result from this process are unintentional.

## 2023-06-20 NOTE — Patient Instructions (Addendum)
I had a long discussion with the patient, his wife and daughter regarding his parkinsonian symptoms and frequent falls, remote history of stroke and seizures and answered questions.  Patient was unable to tolerate Sinemet due to significant hallucinations and side effects.  I recommend referral to Dr. Frances Furbish movement disorder specialist to discuss other treatment options since he is still sensitive to medications.  Encouraged him to use his walker at all times and we discussed fall safety precautions.  Continue Eliquis for stroke prevention for his A-fib with strict control of risk factors with blood pressure goal below 130/90, lipids with LDL cholesterol goal below 70 mg percent and diabetes with hemoglobin A1c goal below 6.5%.  Continue Keppra in the current dose of 500 mg daily for seizure prophylaxis.  Return for follow-up in the future in 6 months with nurse practitioner or call earlier if necessary.  Fall Prevention in the Home, Adult Falls can cause injuries and affect people of all ages. There are many simple things that you can do to make your home safe and to help prevent falls. If you need it, ask for help making these changes. What actions can I take to prevent falls? General information Use good lighting in all rooms. Make sure to: Replace any light bulbs that burn out. Turn on lights if it is dark and use night-lights. Keep items that you use often in easy-to-reach places. Lower the shelves around your home if needed. Move furniture so that there are clear paths around it. Do not keep throw rugs or other things on the floor that can make you trip. If any of your floors are uneven, fix them. Add color or contrast paint or tape to clearly mark and help you see: Grab bars or handrails. First and last steps of staircases. Where the edge of each step is. If you use a ladder or stepladder: Make sure that it is fully opened. Do not climb a closed ladder. Make sure the sides of the ladder  are locked in place. Have someone hold the ladder while you use it. Know where your pets are as you move through your home. What can I do in the bathroom?     Keep the floor dry. Clean up any water that is on the floor right away. Remove soap buildup in the bathtub or shower. Buildup makes bathtubs and showers slippery. Use non-skid mats or decals on the floor of the bathtub or shower. Attach bath mats securely with double-sided, non-slip rug tape. If you need to sit down while you are in the shower, use a non-slip stool. Install grab bars by the toilet and in the bathtub and shower. Do not use towel bars as grab bars. What can I do in the bedroom? Make sure that you have a light by your bed that is easy to reach. Do not use any sheets or blankets on your bed that hang to the floor. Have a firm bench or chair with side arms that you can use for support when you get dressed. What can I do in the kitchen? Clean up any spills right away. If you need to reach something above you, use a sturdy step stool that has a grab bar. Keep electrical cables out of the way. Do not use floor polish or wax that makes floors slippery. What can I do with my stairs? Do not leave anything on the stairs. Make sure that you have a light switch at the top and the bottom of the  stairs. Have them installed if you do not have them. Make sure that there are handrails on both sides of the stairs. Fix handrails that are broken or loose. Make sure that handrails are as long as the staircases. Install non-slip stair treads on all stairs in your home if they do not have carpet. Avoid having throw rugs at the top or bottom of stairs, or secure the rugs with carpet tape to prevent them from moving. Choose a carpet design that does not hide the edge of steps on the stairs. Make sure that carpet is firmly attached to the stairs. Fix any carpet that is loose or worn. What can I do on the outside of my home? Use bright  outdoor lighting. Repair the edges of walkways and driveways and fix any cracks. Clear paths of anything that can make you trip, such as tools or rocks. Add color or contrast paint or tape to clearly mark and help you see high doorway thresholds. Trim any bushes or trees on the main path into your home. Check that handrails are securely fastened and in good repair. Both sides of all steps should have handrails. Install guardrails along the edges of any raised decks or porches. Have leaves, snow, and ice cleared regularly. Use sand, salt, or ice melt on walkways during winter months if you live where there is ice and snow. In the garage, clean up any spills right away, including grease or oil spills. What other actions can I take? Review your medicines with your health care provider. Some medicines can make you confused or feel dizzy. This can increase your chance of falling. Wear closed-toe shoes that fit well and support your feet. Wear shoes that have rubber soles and low heels. Use a cane, walker, scooter, or crutches that help you move around if needed. Talk with your provider about other ways that you can decrease your risk of falls. This may include seeing a physical therapist to learn to do exercises to improve movement and strength. Where to find more information Centers for Disease Control and Prevention, STEADI: TonerPromos.no [employer] on Aging: BaseRingTones.pl National Institute on Aging: BaseRingTones.pl Contact a health care provider if: You are afraid of falling at home. You feel weak, drowsy, or dizzy at home. You fall at home. Get help right away if you: Lose consciousness or have trouble moving after a fall. Have a fall that causes a head injury. These symptoms may be an emergency. Get help right away. Call 911. Do not wait to see if the symptoms will go away. Do not drive yourself to the hospital. This information is not intended to replace advice given to you by your health  care provider. Make sure you discuss any questions you have with your health care provider. Document Revised: 01/18/2022 Document Reviewed: 01/18/2022 Elsevier Patient Education  2024 [employer].

## 2023-06-22 ENCOUNTER — Encounter: Payer: Self-pay | Admitting: Neurology

## 2023-06-22 ENCOUNTER — Ambulatory Visit (INDEPENDENT_AMBULATORY_CARE_PROVIDER_SITE_OTHER): Payer: Medicare Other | Admitting: Neurology

## 2023-06-22 VITALS — BP 143/87 | HR 72 | Ht 71.0 in | Wt 141.0 lb

## 2023-06-22 DIAGNOSIS — Z8669 Personal history of other diseases of the nervous system and sense organs: Secondary | ICD-10-CM

## 2023-06-22 DIAGNOSIS — R296 Repeated falls: Secondary | ICD-10-CM | POA: Diagnosis not present

## 2023-06-22 DIAGNOSIS — R441 Visual hallucinations: Secondary | ICD-10-CM

## 2023-06-22 DIAGNOSIS — R269 Unspecified abnormalities of gait and mobility: Secondary | ICD-10-CM

## 2023-06-22 DIAGNOSIS — Z9181 History of falling: Secondary | ICD-10-CM

## 2023-06-22 DIAGNOSIS — G20C Parkinsonism, unspecified: Secondary | ICD-10-CM

## 2023-06-22 NOTE — Progress Notes (Signed)
Subjective:    Patient ID: Jeffrey [name] is a 89 y.o. male.  HPI    Jeffrey Foley, MD, PhD [facility] [address], [address] [address] [place], [place] [address]  Dear Jeffrey [name],   I saw your patient, Jeffrey [name], upon your kind request in my neurologic clinic today for second opinion of his parkinsonism.  The patient is accompanied by by his wife and his daughter, Jeffrey [name], today.  As you know, Jeffrey [name] is an 89 year old gentleman with an underlying complex medical history of stroke, TIA, history of encephalopathy, atrial fibrillation, chronic combined systolic and diastolic congestive heart failure, diabetes, hypertension, hyperlipidemia, macular degeneration, aortic stenosis, prostate cancer with status post seed implants, RMSF, seizure disorder, and gait disorder with frequent falls, who reports a several month history of difficulty feeding himself, gait disorder, recurrent falls and tremors.  The tremor does not bother him very much, it is more noticeable on the left.  He is not aware of any family history of Parkinson's disease, but his daughter reports that his maternal uncle may have had Parkinson's disease.  Mom lived to be 39 or 41 years old, dad died in his early 63s from a heart attack.  Patient reports that he lost quite a bit of weight but is gaining some weight back, he has a good appetite.  He had some home health therapy which was helpful in the past.  Per daughter, he had a swallow test with which showed some concerns.  I reviewed his barium swallow study from 11/22/2022.  He was noted to have moderate oropharyngeal dysphagia with silent aspiration of thin liquids noted.  Recommendation was for dysphagia 2 diet and mildly thick liquids with full supervision.  He has difficulty with both shoulders, had rotator cuff surgery on the right but also has shoulder pain on the left..  I reviewed your office note from 06/20/2023.  He has had recurrent falls.  He had side  effects on Sinemet with increased hallucinations, these improved after he stopped the Sinemet.  He has trouble standing and walking.  Of note, he is on multiple medications including Eliquis and Keppra, he takes Myrbetriq for his bladder.    He had a head CT without contrast as well as cervical spine CT without contrast through [facility] emergency room on 03/19/2023 with indication of neck trauma and head trauma, I reviewed the results:  IMPRESSION: 1. No evidence of acute abnormality of intracranially or in the cervical spine. 2. Left posterior scalp contusion/laceration.  In addition, I personally and independently reviewed images through the PACS system.  Significant periventricular chronic white matter changes were seen on the CT scan with global atrophy noted.    He had a head CT without contrast through [facility] ED on 02/26/2023 with indication of fall, laceration to the left side of the head, on Eliquis, I reviewed the results:   IMPRESSION: 1. No acute intracranial pathology. 2. Soft tissue contusion and laceration of the left parietal scalp. 3. No fracture or static subluxation of the cervical spine. 4. Moderate multilevel cervical disc degenerative disease.   In addition, I personally and independently reviewed images through the PACS system.  He had a head CT without contrast through [facility] ED on 12/02/2022 with indication of mental status change and I reviewed the results:   IMPRESSION: 1. No acute intracranial findings. 2. Chronic small vessel ischemic changes.   He had a head CT without contrast through outpatient imaging  on 11/04/2022 with indication of head trauma, recurrent syncope episodes and I reviewed the results:  IMPRESSION: 1. No acute intracranial abnormality. 2. Moderate generalized cerebral atrophy and chronic microvascular ischemic changes of the white matter. 3. Left parietal scalp hematoma without  evidence of underlying fracture.   He had multiple CT scans of the head prior to that.  He had a brain MRI with and without contrast through outpatient imaging on 05/25/2018 with indication of seizure disorder and tremor, I reviewed the results:   IMPRESSION: Small area of restricted diffusion in the right posterior temporal lobe most consistent with small acute or subacute infarct   Moderate atrophy and moderate chronic microvascular ischemic change. Chronic microhemorrhage left occipital lobe.   These results will be called to the ordering clinician or representative by the Radiologist Assistant, and communication documented in the PACS or zVision Dashboard.   In addition, I personally and independently reviewed images through the PACS system.  Significant bilateral deep white matter changes were noted as well as moderate global atrophy of the brain was noted.  He is currently not on any medication for parkinsonism.  His Past Medical History Is Significant For: Past Medical History:  Diagnosis Date   Acute CVA (cerebrovascular accident) (HCC)    Acute encephalopathy 11/27/2014   Aortic atherosclerosis (HCC) 11/25/2014   Atrial fibrillation (HCC)    a. 12/2018 [facility]io: Sinus rhythm, avg rate 71. 2 runs of VT (max 17 beats). 27 runs SVT (max 14 beats). Occas PACs/PVCs. No high grade AV block/pauses.   Chronic combined systolic (congestive) and diastolic (congestive) heart failure (HCC)    a. 10/2014 Echo: EF 40-45%; b. 03/2018 Echo: EF 55-60%; c. 12/2018 Echo: EF 55-60%, mild conc LVH. Nl RV fxn. RVSP 51.57mmHg. Mildly dil LA. Mod AS w/ severe Ca2+; d. 12/2019 Echo: EF 50-55%, mild LVH, gr2 DD. Sev elev PASP. Mild MR, mild to mod TR. Triv AI, mod to sev AS (area 0.77cm^2, mean grad . Vmax 2.58m/s).   Diabetes mellitus    Hypercholesteremia    Hypertension    Impaired fasting glucose    Macular degeneration    Moderate to severe aortic stenosis    a. 12/2018 Echo: EF 55-60%, Mod AS w/  sev AoV Ca2+; b. 12/2019 Echo: EF 50-55%. Mod to sev AS (area 0.77cm^2, mean grad . Vmax 2.62m/s).   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 Rhea Belton)   RMSF [facility] spotted fever) 10/2014   (hosp with FUO, encephalopathy)   Rotator cuff tear, right 10/2010   supraspinatous and infraspinatous   Seizure disorder (HCC) 07/25/2019   TIA (transient ischemic attack) 10/02/13   hosp at [facility] x 1 night   Type 2 diabetes mellitus with microalbuminuria or microproteinuria 01/15/2014    His Past Surgical History Is Significant For: Past Surgical History:  Procedure Laterality Date   CATARACT EXTRACTION Right 01/2017   CIRCUMCISION  01/2017   Dr. Earlene Plater   COLONOSCOPY N/A 11/28/2012   Procedure: COLONOSCOPY;  Surgeon: Beverley Fiedler, MD;  Location: [facility] ENDOSCOPY;  Service: Gastroenterology;  Laterality: N/A;   INTERNAL URETHROTOMY  01/27/2016   WF   prostate seed implant     ROTATOR CUFF REPAIR  06/16/2011   right (Dr. Luiz Blare)   TONSILLECTOMY AND ADENOIDECTOMY  age 58    His Family History Is Significant For:  Family History  Problem Relation Age of Onset   Dementia Mother    Diabetes Mother    Heart disease Mother    Hypertension Mother    Heart disease Father 6       Died suddenly.  No diagnosis   Stroke Sister    Cancer Daughter        ovarian (?) vs other male cancer; s/p hyst doing well   Diabetes Daughter        GDM, and now AODM   Heart disease Son        congestive heart failure--improved   Congestive Heart Failure Grandchild     His Social History Is Significant For: Social History   Socioeconomic History   Marital status: Married    Spouse name: Fleet Contras   Number of children: 4   Years of education: Not on file   Highest education level: Not on file  Occupational History   Occupation:  Retired  Tobacco Use   Smoking status: Former    Current packs/day: 0.00    Average packs/day: 1 pack/day for 20.0 years (20.0 ttl pk-yrs)    Types: Cigarettes    Start date: 05/31/1960    Quit date: 05/31/1980    Years since quitting: 43.0   Smokeless tobacco: Never  Vaping Use   Vaping status: Never Used  Substance and Sexual Activity   Alcohol use: Not Currently    Alcohol/week: 3.0 standard drinks of alcohol    Types: 3 Cans of beer per week    Comment: e beer 3x/week   Drug use: No   Sexual activity: Not Currently    Partners: Female    Comment: issues with ED  Other Topics Concern   Not on file  Social History Narrative   Married. Retired Emergency planning/management officer.  2 sons, 2 daughters (all in [place]), 15+ grandchildren, 9 great grandchildren   Lives with wife, 1 dog.   Right Handed   Drinks 2 cups of caffeine daily      Updated 08/2022   Social Drivers of Health   Financial Resource Strain: Low Risk  (05/07/2021)   Overall Financial Resource Strain (CARDIA)    Difficulty of Paying Living Expenses: Not very hard  Food Insecurity: Not on file  Transportation Needs: No Transportation Needs (05/07/2021)   PRAPARE - Administrator, Civil Service (Medical): No    Lack of Transportation (Non-Medical): No  Physical Activity: Not on file  Stress: Not on file  Social Connections: Not on file    His Allergies Are:  No Known Allergies:   His Current Medications Are:  Outpatient Encounter Medications as of 06/22/2023  Medication Sig   acetaminophen (TYLENOL) 500 MG tablet Take 500 mg by mouth 2 (two) times daily.   apixaban (ELIQUIS) 2.5 MG TABS tablet Take 1 tablet (2.5 mg total) by mouth 2 (two) times daily.   atorvastatin (LIPITOR) 20 MG tablet Take 20 mg by mouth daily.   carvedilol (COREG) 6.25 MG tablet Take 1 tablet (6.25 mg total) by mouth 2 (two) times daily.   Cyanocobalamin (VITAMIN B 12 PO) Take 1,000 mcg by mouth.   ipratropium (ATROVENT) 0.03 % nasal spray Place 2  sprays into both nostrils 3 (three) times daily as needed for rhinitis.   levETIRAcetam (KEPPRA XR) 500 MG 24 hr tablet TAKE ONE TABLET BY MOUTH EVERY DAY   loperamide (IMODIUM) 2 MG capsule Take 2 mg by mouth as needed for diarrhea or loose stools.   loratadine (  CLARITIN) 10 MG tablet Take by mouth.   mirabegron ER (MYRBETRIQ) 50 MG TB24 tablet Take 1 tablet (50 mg total) by mouth daily.   Multiple Vitamins-Minerals (PRESERVISION AREDS) CAPS Take 1 capsule by mouth 2 (two) times daily.    sulfamethoxazole-trimethoprim (BACTRIM DS) 800-160 MG tablet Take 1 tablet by mouth 2 (two) times daily.   UNABLE TO FIND Bevacizumab(AVASTIN) chemo injection for the eye every 10 weeks   No facility-administered encounter medications on file as of 06/22/2023.  :   Review of Systems:  Out of a complete 14 point review of systems, all are reviewed and negative with the exception of these symptoms as listed below:  Review of Systems  Neurological:        Patient in room #5 with his wife and daughter. Patient states he here to have had second option on his diagnose with Parkinsonism.    Objective:  Neurological Exam  Physical Exam Physical Examination:   Vitals:   06/22/23 1319  BP: (!) 143/87  Pulse: 72    General Examination: The patient is a very pleasant 89 y.o. male in no acute distress. He appears frail, deconditioned, hard of hearing.    HEENT: Normocephalic, atraumatic, pupils are equal, round and reactive to light, tracking is impaired, he is hard of hearing.  Nuchal rigidity is moderate to significant.  Face is symmetric, mild facial masking noted.  Speech with moderate hypophonia and mild to moderate dysarthria at times.  No significant sialorrhea noted.  Airway examination reveals mild to moderate dryness.  Tongue protrudes centrally and palate elevates symmetrically.  Chest: Clear to auscultation without wheezing, rhonchi or crackles noted.  Heart: S1+S2+0, regular with a systolic  murmur noted.     Abdomen: Soft, non-tender and non-distended.  Extremities: There is 1+ pitting edema in the distal lower extremities bilaterally.   Skin: Warm and dry without trophic changes noted.   Musculoskeletal: exam reveals significant decreased range of motion in both shoulders, right worse than left, decreased dexterity in the right hand, decreased range of motion in both lower extremities.  Arthritic changes in both hands.   Neurologically:  Mental status: The patient is awake, alert and oriented in all 4 spheres. His immediate and remote memory, attention, language skills and fund of knowledge are mildly impaired, history is supplemented by his wife and daughter.  Mood is normal and affect is normal.  Cranial nerves II - XII are as described above under HEENT exam.  Motor exam: Thin bulk, global strength of 4 out of 5, he has significant decreased range of motion and dexterity issues with the right upper extremity.  Overall fine motor skills are moderately to severely impaired, mostly in the right upper extremity but foot taps and foot agility moderate to severely impaired on the left and moderately so on the right.  He has moderate impairment of fine motor skills in the left upper extremity with an intermittent mild resting tremor noted in the left upper extremity only.  Tone is increased in both upper extremities.  No telltale cogwheeling.   There is no significant postural and action tremor.   Cerebellar testing: No dysmetria or intention tremor. There is no truncal or gait ataxia.  Sensory exam: intact to light touch in the upper and lower extremities. Reflexes diminished throughout. Gait, station and balance: He stands with significant difficulty and pushes himself up, requires no significant assistance but does need some help when getting back into the chair.  He walks with a rolling  walker, maneuvers the walker reasonably well, he has a decreased stride length and pace.  Posture  is probably age-appropriate to mildly stooped for age.    Assessment and Plan:  In summary, Jeffrey [name] is a very pleasant 89 y.o.-year old male an 89 year old gentleman with an underlying complex medical history of stroke, TIA, history of encephalopathy, atrial fibrillation, chronic combined systolic and diastolic congestive heart failure, diabetes, hypertension, hyperlipidemia, macular degeneration, aortic stenosis, prostate cancer with status post seed implants, RMSF, seizure disorder, and gait disorder with frequent falls, who presents for evaluation of his concern is him, for second opinion.  History and examination are complicated, while he has significant decrease in range of motion and dexterity in the right upper extremity, I do believe that this is mostly from orthopedic issues.  He has more left-sided lateralization when it comes to parkinsonian changes.  He may have idiopathic left-sided predominant Parkinson disease but unfortunately his situation is complicated due to his recurrent strokes, vascular parkinsonism is not completely excluded or atypical parkinsonism.  Of note, he had significant visual hallucinations when he was tried on levodopa therapy.  This does raise the question of atypical parkinsonism but physical exam is more in keeping with idiopathic Parkinson's disease.  You could certainly try levodopa therapy again with a long-acting formulation such as 25-100 mg strength ER once or twice daily to start.  We talked about this during our lengthy appointment today and mutually agreed to hold off for now.  I did talk to him and his family at length about the importance of fall prevention and supportive care.  He had a swallow study in 2024 which showed silent aspiration.  He is reminded to work on his strengthening exercises, use his walker at all times, stay well-hydrated with water, avoid caffeine.  He does endorse drinking iced tea, at least 3 cups a day with meals.  He is advised to  follow-up with you as scheduled as well as his other providers.  I answered all the questions today and the patient and his family were in agreement. Thank you very much for allowing me weigh in. If I can be of any further assistance to you please do not hesitate to talk to me.   Sincerely,   Jeffrey Foley, MD, PhD   I spent 60 minutes in total face-to-face time and in reviewing records during pre-charting, more than 50% of which was spent in counseling and coordination of care, reviewing test results, reviewing medications and treatment regimen and/or in discussing or reviewing the diagnosis of parkinsonism, the prognosis and treatment options. Pertinent laboratory and imaging test results that were available during this visit with the patient were reviewed by me and considered in my medical decision making (see chart for details).

## 2023-06-22 NOTE — Patient Instructions (Addendum)
It was nice to meet you today. I agree with Dr. Pearlean Brownie that you you have signs and symptoms of Parkinson's disease.  You may also have symptoms that can mimic Parkinson's disease, we called his parkinsonism.   Parkinson's disease or parkinsonism can affect your balance, your memory, your mood, your bowel and bladder function, your posture, balance and walking and your activities of daily living.   Unfortunately, you had side effects with levodopa therapy.   I do want to suggest a few things today:  Remember to drink plenty of fluid at least 6 glasses (8 oz each), unless you are restricted in your fluid intake per cardiology.  Eat healthy meals and do not skip any meals. Try to eat protein with a every meal and eat a healthy snack such as fruit or nuts in between meals. Try to keep a regular sleep-wake schedule and try to exercise daily, and focus on strength training, you could do sit down exercises with resistance bands as well.    Please use your walker at all times.  Fall prevention is very important in your case.  As far as your medications are concerned, I would like hold off on any medications at this time, you can consider levodopa therapy and the long-acting formulation down the road.  Please follow-up with Dr. Pearlean Brownie as scheduled.

## 2023-07-04 ENCOUNTER — Ambulatory Visit: Payer: Medicare Other | Admitting: Neurology

## 2023-07-11 DIAGNOSIS — B079 Viral wart, unspecified: Secondary | ICD-10-CM | POA: Diagnosis not present

## 2023-07-11 DIAGNOSIS — L570 Actinic keratosis: Secondary | ICD-10-CM | POA: Diagnosis not present

## 2023-07-11 DIAGNOSIS — L905 Scar conditions and fibrosis of skin: Secondary | ICD-10-CM | POA: Diagnosis not present

## 2023-07-11 DIAGNOSIS — D485 Neoplasm of uncertain behavior of skin: Secondary | ICD-10-CM | POA: Diagnosis not present

## 2023-07-14 ENCOUNTER — Encounter: Payer: Self-pay | Admitting: Family Medicine

## 2023-07-14 ENCOUNTER — Other Ambulatory Visit: Payer: Self-pay | Admitting: Family Medicine

## 2023-07-14 ENCOUNTER — Ambulatory Visit: Payer: Medicare Other | Admitting: Family Medicine

## 2023-07-14 ENCOUNTER — Ambulatory Visit
Admission: RE | Admit: 2023-07-14 | Discharge: 2023-07-14 | Disposition: A | Discharge status: Home / Self Care | Payer: Medicare Other | Source: Ambulatory Visit | Attending: Family Medicine | Admitting: Family Medicine

## 2023-07-14 VITALS — BP 160/90 | HR 80 | Ht 71.0 in | Wt 140.0 lb

## 2023-07-14 DIAGNOSIS — I509 Heart failure, unspecified: Secondary | ICD-10-CM | POA: Diagnosis not present

## 2023-07-14 DIAGNOSIS — Z5181 Encounter for therapeutic drug level monitoring: Secondary | ICD-10-CM

## 2023-07-14 DIAGNOSIS — J90 Pleural effusion, not elsewhere classified: Secondary | ICD-10-CM | POA: Diagnosis not present

## 2023-07-14 DIAGNOSIS — R0602 Shortness of breath: Secondary | ICD-10-CM

## 2023-07-14 DIAGNOSIS — R600 Localized edema: Secondary | ICD-10-CM

## 2023-07-14 DIAGNOSIS — I4891 Unspecified atrial fibrillation: Secondary | ICD-10-CM

## 2023-07-14 DIAGNOSIS — I10 Essential (primary) hypertension: Secondary | ICD-10-CM

## 2023-07-14 MED ORDER — FUROSEMIDE 20 MG PO TABS
20.0000 mg | ORAL_TABLET | Freq: Every day | ORAL | 3 refills | Status: DC
Start: 2023-07-14 — End: 2023-07-19

## 2023-07-14 NOTE — Patient Instructions (Addendum)
Go to [facility] for chest x-ray now.  Try and keep your legs elevated throughout the day. Tonight, be sure to keep the head of the bed elevated (vs rest in recliner).  We will get your x-ray and labs back by this evening. I suspect that we will be sending in a diuretic (lasix) and hope that will improve your symptoms.  If the labs or x-ray are concerning, we may have you go to the hospital.   ADDENDUM: Your chest x-ray shows congestive heart failure. I sent a prescription for furosemide (diuretic, also called lasix) to [facility]. Take 2 pills today, then 1 pill tomorrow morning, and another by mid-day. Weigh yourself every day. If your weight is down by 3-4 pounds in 2 days (Saturday morning), then cut the dose back to taking just 1 pill every morning (morning is the best time to take it, to limit the bathroom trips at night, but please start tonight!) Eat some bananas for potassium (which can be lost in the urine, related to the diuretic). If your shortness of breath isn't improving, if your weight isn't coming down, you may need to go to the hospital, rather than waiting until Tuesday to see Dr. Kirke Corin.

## 2023-07-14 NOTE — Progress Notes (Signed)
Chief Complaint  Patient presents with   Shortness of Breath    SOB that started 3-4 days ago. No chest pain. Feels like a swollen spot in the center in his chest. When he sneezes he feels like he gasps for air-but when asked about sick symptoms stated he has none. Ongoing runny nose. He sleeps on his back and he has a hard time breathing at night. O2 was 91 when he first got into room, went up to 98. Sounds like a "dog growling" ar night when he's breathing.    Hears noisy breathing when laying flat, trouble catching his breath x 3-4 days. Breathing would improve as the day went on. He feels like he doesn't have enough air even to sneeze.  Today he notes more SOB, lasting during the day (not improving upon being upright). Decreased appetite today, labored breathing.  He has noticed some swelling in his ankles about a week ago. Right ankle is more swollen today. Denies pain in his leg.  He has known Atrial fibrillation, HFpEF/pulmonary hypertension, hypertension and aortic stenosis.  He is under the care of Dr. Kirke Corin, and has an appointment next Tuesday to see him.  Last echo 11/2021--Mod AS, EF 50-55%, grade II DD, mod dilated L atrium, mod pulm HTN   PMH, PSH, SH reviewed  Outpatient Encounter Medications as of 07/14/2023  Medication Sig Note   acetaminophen (TYLENOL) 500 MG tablet Take 500 mg by mouth 2 (two) times daily. 07/14/2023: Rakes 2 BID   apixaban (ELIQUIS) 2.5 MG TABS tablet Take 1 tablet (2.5 mg total) by mouth 2 (two) times daily.    atorvastatin (LIPITOR) 20 MG tablet Take 20 mg by mouth daily. 03/14/2023: He thinks he still taking (meds in blister pack-they will check)   carvedilol (COREG) 6.25 MG tablet Take 1 tablet (6.25 mg total) by mouth 2 (two) times daily. 07/14/2023: Taking 3.125 BID   Cyanocobalamin (VITAMIN B 12 PO) Take 1,000 mcg by mouth.    levETIRAcetam (KEPPRA XR) 500 MG 24 hr tablet TAKE ONE TABLET BY MOUTH EVERY DAY    mirabegron ER (MYRBETRIQ) 50 MG TB24  tablet Take 1 tablet (50 mg total) by mouth daily.    Multiple Vitamins-Minerals (PRESERVISION AREDS) CAPS Take 1 capsule by mouth 2 (two) times daily.     UNABLE TO FIND Bevacizumab(AVASTIN) chemo injection for the eye every 10 weeks 11/10/2022: Having tomorrow   ipratropium (ATROVENT) 0.03 % nasal spray Place 2 sprays into both nostrils 3 (three) times daily as needed for rhinitis. (Patient not taking: Reported on 07/14/2023) 07/14/2023: As needed   loperamide (IMODIUM) 2 MG capsule Take 2 mg by mouth as needed for diarrhea or loose stools. (Patient not taking: Reported on 07/14/2023) 07/14/2023: As needed   loratadine (CLARITIN) 10 MG tablet Take by mouth. (Patient not taking: Reported on 07/14/2023) 07/14/2023: As needed   sulfamethoxazole-trimethoprim (BACTRIM DS) 800-160 MG tablet Take 1 tablet by mouth 2 (two) times daily. (Patient not taking: Reported on 07/14/2023)    No facility-administered encounter medications on file as of 07/14/2023.   No Known Allergies   ROS: No f/c, HA or dizziness. No bleeding bruising. +urinary frequency per usual. +weight gain and edema Some constipation and diarrhea (chronic, not new) No nausea or vomiting. Some decreased appetite today. Generalized weakness, perhaps slightly worse recently. Denies chest pain, palpitatioins. SOB per HPI.   PHYSICAL EXAM:  BP (!) 160/90   Pulse 80   Ht 5\' 11"  (1.803 m)   Wt 140 lb (  63.5 kg)   SpO2 98%   BMI 19.53 kg/m   Wt Readings from Last 3 Encounters:  07/14/23 140 lb (63.5 kg)  06/22/23 141 lb (64 kg)  06/20/23 141 lb (64 kg)   Wt 126# 9.6 oz here in 03/2023. 91% oxygen saturation on arrival to exam room (after walking to room).  95-98% during visit.  Elderly, frail male, in no distress. He appears to be comfortable at rest. He is speaking comfortably. HEENT: conjunctiva and sclera are clear, EOMI. Neck: no lymphadenopathy or mass Heart: regular rate. Irregularly irregular rhythm, with loud murmur,  unchanged. Lungs: Decreased BS at L base of lung, otherwise fairly clear. Abdomen: soft, nontender Extremities: large sock marks, 1-2+ edema, R>L.  Calves nontender. 2+ pulses Skin: intact, no visible rash Psych: normal  mood, affect.  Unshaven. Normal hygiene.   ASSESSMENT/PLAN:  Acute congestive heart failure, unspecified heart failure type (HCC) - confirmed by CXR; labs pending. Start lasix 40mg , daily weights. f/u next week with cardiologist. To ER if not diuresing, symptoms not improving - Plan: furosemide (LASIX) 20 MG tablet  Shortness of breath - based on exam, suspect CHF, poss pleural effusion. Declines ER/hospital. Will check CXR, stat labs, and oral diuresis. Has f/u with cardiology next week - Plan: DG Chest 2 View, Comprehensive metabolic panel, CBC with Differential/Platelet, Brain natriuretic peptide, D-dimer, quantitative  Lower extremity edema - elevate legs, limit sodium. Doubt DVT (on blood thinner), suspect related to CHF. Await labs, then rx lasix. Daily weights - Plan: DG Chest 2 View, Brain natriuretic peptide, furosemide (LASIX) 20 MG tablet  Essential hypertension, benign - BP elevated today. Sees cardiologist next week. Cont current meds. Will be adding lasix after seeing CXR - Plan: Comprehensive metabolic panel  Atrial fibrillation, unspecified type (HCC) - in afib, rate controlled. sees cardiologist next week. anticoagulated. Cont current meds  Medication monitoring encounter - Plan: Comprehensive metabolic panel, CBC with Differential/Platelet   Cbc, c-met, BNP, d-dimer Will forward results to Dr. Kirke Corin, as they f/u with him on Tuesday.   Spoke with Fleet Contras (pt's wife), and French Ana (daughter) with CXR results, and plan for Lasix. Advised both of them that AVS would be updated, so that family could see the medication instructions.  [phone]--pt's home phone [phone] French Ana  I spent 53 minutes dedicated to the care of this patient, including pre-visit  review of records, face to face time, post-visit ordering of testing and documentation.

## 2023-07-15 ENCOUNTER — Encounter: Payer: Self-pay | Admitting: Family Medicine

## 2023-07-16 DIAGNOSIS — R0602 Shortness of breath: Secondary | ICD-10-CM | POA: Diagnosis not present

## 2023-07-16 DIAGNOSIS — R609 Edema, unspecified: Secondary | ICD-10-CM | POA: Diagnosis not present

## 2023-07-16 LAB — CBC WITH DIFFERENTIAL/PLATELET

## 2023-07-16 LAB — COMPREHENSIVE METABOLIC PANEL
ALT: 17 [IU]/L (ref 0–44)
AST: 19 [IU]/L (ref 0–40)
Albumin: 4 g/dL (ref 3.7–4.7)
Alkaline Phosphatase: 89 [IU]/L (ref 44–121)
BUN/Creatinine Ratio: 19 (ref 10–24)
BUN: 20 mg/dL (ref 8–27)
Bilirubin Total: 0.4 mg/dL (ref 0.0–1.2)
CO2: 24 mmol/L (ref 20–29)
Calcium: 9 mg/dL (ref 8.6–10.2)
Chloride: 107 mmol/L — ABNORMAL HIGH (ref 96–106)
Creatinine, Ser: 1.07 mg/dL (ref 0.76–1.27)
Globulin, Total: 2.4 g/dL (ref 1.5–4.5)
Glucose: 115 mg/dL — ABNORMAL HIGH (ref 70–99)
Potassium: 4.6 mmol/L (ref 3.5–5.2)
Sodium: 145 mmol/L — ABNORMAL HIGH (ref 134–144)
Total Protein: 6.4 g/dL (ref 6.0–8.5)
eGFR: 67 mL/min/{1.73_m2} (ref >59)

## 2023-07-16 LAB — D-DIMER, QUANTITATIVE: D-DIMER: 0.74 mg{FEU}/L — ABNORMAL HIGH (ref 0.00–0.49)

## 2023-07-16 LAB — BRAIN NATRIURETIC PEPTIDE: BNP: 1959.9 pg/mL — ABNORMAL HIGH (ref 0.0–100.0)

## 2023-07-19 ENCOUNTER — Encounter: Payer: Self-pay | Admitting: Cardiovascular Disease

## 2023-07-19 ENCOUNTER — Ambulatory Visit: Payer: Medicare Other | Attending: Cardiovascular Disease | Admitting: Cardiovascular Disease

## 2023-07-19 VITALS — BP 112/78 | HR 72 | Ht 71.0 in | Wt 138.0 lb

## 2023-07-19 DIAGNOSIS — I2721 Secondary pulmonary arterial hypertension: Secondary | ICD-10-CM | POA: Diagnosis not present

## 2023-07-19 DIAGNOSIS — R600 Localized edema: Secondary | ICD-10-CM | POA: Insufficient documentation

## 2023-07-19 DIAGNOSIS — I6522 Occlusion and stenosis of left carotid artery: Secondary | ICD-10-CM | POA: Diagnosis not present

## 2023-07-19 DIAGNOSIS — E785 Hyperlipidemia, unspecified: Secondary | ICD-10-CM | POA: Diagnosis not present

## 2023-07-19 DIAGNOSIS — I10 Essential (primary) hypertension: Secondary | ICD-10-CM | POA: Insufficient documentation

## 2023-07-19 DIAGNOSIS — I350 Nonrheumatic aortic (valve) stenosis: Secondary | ICD-10-CM | POA: Insufficient documentation

## 2023-07-19 DIAGNOSIS — I480 Paroxysmal atrial fibrillation: Secondary | ICD-10-CM | POA: Diagnosis not present

## 2023-07-19 MED ORDER — POTASSIUM CHLORIDE ER 20 MEQ PO TBCR
20.0000 meq | EXTENDED_RELEASE_TABLET | Freq: Every day | ORAL | 1 refills | Status: DC
Start: 2023-07-19 — End: 2023-08-01

## 2023-07-19 MED ORDER — FUROSEMIDE 40 MG PO TABS: 40.0000 mg | ORAL_TABLET | Freq: Every day | ORAL | 1 refills | Status: DC

## 2023-07-19 NOTE — Patient Instructions (Signed)
Medication Instructions:  Your physician recommends the following medication changes.  STOP TAKING: Carvedilol  START TAKING: Potassium 20 mEq once daily  INCREASE: Furosemide to 40 mg once daily  *If you need a refill on your cardiac medications before your next appointment, please call your pharmacy*   Lab Work: None ordered If you have labs (blood work) drawn today and your tests are completely normal, you will receive your results only by: MyChart Message (if you have MyChart) OR A paper copy in the mail If you have any lab test that is abnormal or we need to change your treatment, we will call you to review the results.   Testing/Procedures: Your physician has requested that you have an echocardiogram within 2 weeks. Echocardiography is a painless test that uses sound waves to create images of your heart. It provides your doctor with information about the size and shape of your heart and how well your heart's chambers and valves are working.   You may receive an ultrasound enhancing agent through an IV if needed to better visualize your heart during the echo. This procedure takes approximately one hour.  There are no restrictions for this procedure.  This will take place at [address] [facility]) [address], [place] [address]  Please note: We ask at that you not bring children with you during ultrasound (echo/ vascular) testing. Due to room size and safety concerns, children are not allowed in the ultrasound rooms during exams. Our front office staff cannot provide observation of children in our lobby area while testing is being conducted. An adult accompanying a patient to their appointment will only be allowed in the ultrasound room at the discretion of the ultrasound technician under special circumstances. We apologize for any inconvenience.    Follow-Up: At [facility], you and your health needs are our priority.  As part of our continuing mission  to provide you with exceptional heart care, we have created designated Provider Care Teams.  These Care Teams include your primary Cardiologist (physician) and Advanced Practice Providers (APPs -  Physician Assistants and Nurse Practitioners) who all work together to provide you with the care you need, when you need it.  We recommend signing up for the patient portal called "MyChart".  Sign up information is provided on this After Visit Summary.  MyChart is used to connect with patients for Virtual Visits (Telemedicine).  Patients are able to view lab/test results, encounter notes, upcoming appointments, etc.  Non-urgent messages can be sent to your provider as well.   To learn more about what you can do with MyChart, go to [email or web address].    Your next appointment:   3 month(s)  Provider:   Dr. Kirke Corin Other Instructions A referral has been placed to [facility]. They will call you for an appointment

## 2023-07-19 NOTE — Progress Notes (Unsigned)
Cardiology Office Note   Date:  07/21/2023   ID:  Jeffrey [name] [birth year], MRN [id number]  PCP:  Joselyn Arrow, MD  Cardiologist:   Lorine Bears, MD   No chief complaint on file.     History of Present Illness: Jeffrey [name] is a 89 y.o. male who presents for a follow-up visit regarding paroxysmal atrial fibrillation and aortic stenosis. Other medical problems include hyperlipidemia, TIA,  moderate left carotid stenosis, sleep apnea and borderline diabetes mellitus. He had previous stroke in June 2016 and later was found to have [facility] Fever which was treated with doxycycline.    Nuclear stress test in August of 2016 showed no evidence of ischemia with normal ejection fraction.  Most recent echocardiogram in July 2023 which showed low normal LV systolic function with moderate LVH, moderate pulmonary hypertension, mild mitral regurgitation and moderate aortic stenosis with mean gradient of 23 mmHg.  He had significant weight loss in 2023 due to poor appetite overall.  He had an emergency room visit in October for weakness and was found to have mild volume depletion.  Furosemide was discontinued in February 2024.  He had recurrent falls over the last 2 years.  He had about 4-5 falls last year alone.  In addition, he had slight memory decline but the family reports stability over the last year.  He was seen recently by neurology and is suspected of having Parkinson's.  He was seen recently by his primary care physician due to increased shortness of breath, worsening ankle edema and suspected volume overload.  He had labs done which showed elevated BNP close to 2000.  He was started on Lasix 20 mg daily.  Past Medical History:  Diagnosis Date   Acute CVA (cerebrovascular accident) (HCC)    Acute encephalopathy 11/27/2014   Aortic atherosclerosis (HCC) 11/25/2014   Atrial fibrillation (HCC)    a. 12/2018 [facility]: Sinus rhythm, avg rate 71. 2 runs of VT (max 17 beats).  27 runs SVT (max 14 beats). Occas PACs/PVCs. No high grade AV block/pauses.   Chronic combined systolic (congestive) and diastolic (congestive) heart failure (HCC)    a. 10/2014 Echo: EF 40-45%; b. 03/2018 Echo: EF 55-60%; c. 12/2018 Echo: EF 55-60%, mild conc LVH. Nl RV fxn. RVSP 51.25mmHg. Mildly dil LA. Mod AS w/ severe Ca2+; d. 12/2019 Echo: EF 50-55%, mild LVH, gr2 DD. Sev elev PASP. Mild MR, mild to mod TR. Triv AI, mod to sev AS (area 0.77cm^2, mean grad . Vmax 2.84m/s).   Diabetes mellitus    Hypercholesteremia    Hypertension    Impaired fasting glucose    Macular degeneration    Moderate to severe aortic stenosis    a. 12/2018 Echo: EF 55-60%, Mod AS w/ sev AoV Ca2+; b. 12/2019 Echo: EF 50-55%. Mod to sev AS (area 0.77cm^2, mean grad . Vmax 2.19m/s).   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 Rhea Belton)   RMSF [facility] spotted fever) 10/2014   (hosp with FUO, encephalopathy)   Rotator cuff tear, right 10/2010   supraspinatous and infraspinatous   Seizure disorder (HCC) 07/25/2019   TIA (transient ischemic attack) 10/02/13   hosp at [facility] x 1 night   Type 2 diabetes mellitus with microalbuminuria or microproteinuria 01/15/2014    Past Surgical History:  Procedure Laterality  Date   CATARACT EXTRACTION Right 01/2017   CIRCUMCISION  01/2017   Dr. Earlene Plater   COLONOSCOPY N/A 11/28/2012   Procedure: COLONOSCOPY;  Surgeon: Beverley Fiedler, MD;  Location: [facility] ENDOSCOPY;  Service: Gastroenterology;  Laterality: N/A;   INTERNAL URETHROTOMY  01/27/2016   WF   prostate seed implant     ROTATOR CUFF REPAIR  06/16/2011   right (Dr. Luiz Blare)   TONSILLECTOMY AND ADENOIDECTOMY  age 89     Current Outpatient Medications  Medication Sig Dispense Refill   acetaminophen (TYLENOL) 500 MG tablet Take 500 mg by mouth 2  (two) times daily.     apixaban (ELIQUIS) 2.5 MG TABS tablet Take 1 tablet (2.5 mg total) by mouth 2 (two) times daily. 60 tablet 11   atorvastatin (LIPITOR) 20 MG tablet Take 20 mg by mouth daily.     Cyanocobalamin (VITAMIN B 12 PO) Take 1,000 mcg by mouth.     ipratropium (ATROVENT) 0.03 % nasal spray Place 2 sprays into both nostrils 3 (three) times daily as needed for rhinitis. 30 mL 12   Iron, Ferrous Sulfate, 325 (65 Fe) MG TABS Take by mouth.     levETIRAcetam (KEPPRA XR) 500 MG 24 hr tablet TAKE ONE TABLET BY MOUTH EVERY DAY 30 tablet 5   loperamide (IMODIUM) 2 MG capsule Take 2 mg by mouth as needed for diarrhea or loose stools.     loratadine (CLARITIN) 10 MG tablet Take by mouth.     mirabegron ER (MYRBETRIQ) 50 MG TB24 tablet Take 1 tablet (50 mg total) by mouth daily. 90 tablet 3   Multiple Vitamins-Minerals (PRESERVISION AREDS) CAPS Take 1 capsule by mouth 2 (two) times daily.      potassium chloride 20 MEQ TBCR Take 1 tablet (20 mEq total) by mouth daily. 90 tablet 1   sulfamethoxazole-trimethoprim (BACTRIM DS) 800-160 MG tablet Take 1 tablet by mouth 2 (two) times daily. 14 tablet 0   UNABLE TO FIND Bevacizumab(AVASTIN) chemo injection for the eye every 10 weeks     furosemide (LASIX) 40 MG tablet Take 1 tablet (40 mg total) by mouth daily. 90 tablet 1   No current facility-administered medications for this visit.    Allergies:   Patient has no known allergies.    Social History:  The patient  reports that he quit smoking about 43 years ago. His smoking use included cigarettes. He started smoking about 63 years ago. He has a 20 pack-year smoking history. He has never used smokeless tobacco. He reports that he does not currently use alcohol after a past usage of about 3.0 standard drinks of alcohol per week. He reports that he does not use drugs.   Family History:  The patient's family history includes Cancer in his daughter; Congestive Heart Failure in his grandchild;  Dementia in his mother; Diabetes in his daughter and mother; Heart disease in his mother and son; Heart disease (age of onset: 35) in his father; Hypertension in his mother; Stroke in his sister.    ROS:  Please see the history of present illness.   Otherwise, review of systems are positive for none.   All other systems are reviewed and negative.    PHYSICAL EXAM: VS:  BP 112/78   Pulse 72   Ht 5\' 11"  (1.803 m)   Wt 138 lb (62.6 kg)   SpO2 96%   BMI 19.25 kg/m  , BMI Body mass index is 19.25 kg/m. GEN: Well nourished, well developed, in no acute distress  HEENT: normal  Neck: Mild JVD.  Faint bilateral carotid bruits Cardiac: Irregularly irregular; no rubs, or gallops,no edema . There is a 3/6 crescendo decrescendo murmur in the aortic area which is late peaking with  diminished S2.  Mild bilateral leg edema. Respiratory:  clear to auscultation bilaterally, normal work of breathing GI: soft, nontender, nondistended, + BS MS: no deformity or atrophy  Skin: warm and dry, no rash  Neuro:  Strength and sensation are intact Psych: euthymic mood, full affect   EKG:  EKG is ordered today. The ekg ordered today demonstrates : Atrial fibrillation Right bundle branch block Septal infarct , age undetermined      Recent Labs: 11/10/2022: TSH 2.230 07/14/2023: ALT 17; BNP 1,959.9; BUN 20; Creatinine, Ser 1.07; Hemoglobin CANCELED; Platelets CANCELED; Potassium 4.6; Sodium 145    Lipid Panel    Component Value Date/Time   CHOL 155 09/23/2022 0830   CHOL 184 10/01/2013 1719   TRIG 87 09/23/2022 0830   TRIG 400 (H) 10/01/2013 1719   HDL 64 09/23/2022 0830   HDL 55 10/01/2013 1719   CHOLHDL 2.4 09/23/2022 0830   CHOLHDL 2.3 05/27/2017 1007   VLDL 28 06/02/2016 0714   VLDL 80 (H) 10/01/2013 1719   LDLCALC 75 09/23/2022 0830   LDLCALC 78 05/27/2017 1007   LDLCALC 49 10/01/2013 1719      Wt Readings from Last 3 Encounters:  07/19/23 138 lb (62.6 kg)  07/14/23 140 lb (63.5 kg)   06/22/23 141 lb (64 kg)         ASSESSMENT AND PLAN:  1.  Acute on chronic diastolic heart failure: He is still significantly volume overloaded and spite of starting furosemide last week.  I elected to increase the dose to 40 mg once daily and add small dose potassium. I suspect progression of aortic stenosis as a culprit for his heart failure.  In addition, I do think atrial fibrillation is playing a role as he was in sinus rhythm during his last visit.  2.  Persistent atrial fibrillation: This is likely contributing to heart failure.  He has been on lower dose of Eliquis 2.5 mg twice daily due to recurrent falls but the correct dose for him is 5 mg twice daily.  I made no changes for now.  3.  Aortic valve stenosis: I suspect progression to the severe range.  I requested a follow-up echocardiogram.  Given his age, frailty, recurrent falls and Parkinson's, I think he is at best a marginal candidate for TAVR.  However, the patient's family were present today and they were distraught by the idea of not doing anything about this. I am going to refer him to the TAVR clinic for a second opinion before initiating any workup.  4. Hyperlipidemia: Most recent lipid profile showed an LDL of 59.  Continue treatment with atorvastatin.  5. Left carotid stenosis: Most recent carotid Doppler in January 2023 showed stable 60 to 79% stenosis on the left side.   6.  Essential hypertension: He has been having low blood pressure readings at home occasionally down to 90 systolic.  I elected to discontinue carvedilol.  This was a prolonged visit that required lengthy discussion with the patient and family about options for treatment of aortic stenosis and atrial fibrillation.  Disposition:   FU in 3 months.  Signed,  Lorine Bears, MD  07/21/2023 9:12 AM    [facility]

## 2023-08-01 ENCOUNTER — Encounter: Payer: Self-pay | Admitting: Cardiovascular Disease

## 2023-08-01 ENCOUNTER — Ambulatory Visit: Payer: Medicare Other | Attending: Cardiovascular Disease | Admitting: Cardiovascular Disease

## 2023-08-01 VITALS — BP 118/76 | HR 71 | Ht 71.0 in | Wt 130.6 lb

## 2023-08-01 DIAGNOSIS — I350 Nonrheumatic aortic (valve) stenosis: Secondary | ICD-10-CM

## 2023-08-01 NOTE — Progress Notes (Signed)
 Pre Surgical Assessment: 5 M Walk Test  72M=16.58ft  5 Meter Walk Test- trial 1: 9.80 seconds 5 Meter Walk Test- trial 2: 8.45 seconds 5 Meter Walk Test- trial 3: 9.40 seconds 5 Meter Walk Test Average: 9.21 seconds

## 2023-08-01 NOTE — Patient Instructions (Addendum)
 Medication Instructions:  No changes *If you need a refill on your cardiac medications before your next appointment, please call your pharmacy*   Lab Work: none If you have labs (blood work) drawn today and your tests are completely normal, you will receive your results only by: MyChart Message (if you have MyChart) OR A paper copy in the mail If you have any lab test that is abnormal or we need to change your treatment, we will call you to review the results.   Testing/Procedures: Echo as planned   Follow-Up: With Dr. Kirke Corin as planned.

## 2023-08-01 NOTE — Progress Notes (Signed)
 [facility] Consult Note  Chief Complaint  Patient presents with   New Patient (Initial Visit)    Aortic stenosis   History of Present Illness: 89 yo male with history of paroxysmal atrial fibrillation, hyperlipidemia, prior CVA, carotid artery disease, diabetes mellitus, sleep apnea, prostate cancer, possible Parkinson's syndrome and aortic stenosis who is here today as a new consult, referred by Dr. Kirke Corin, for further discussion regarding his aortic stenosis and possible TAVR. He has atrial fibrillation and is on Eliquis. He has chronic diastolic CHF and has been on diuretic therapy. He has been followed for moderate aortic stenosis. Last echo was in July 2023 and showed LVEF=50-55% with moderate AS. Recent worsened lower extremity edema treated with increased dose of Lasix. He was seen by Dr. Kirke Corin on 07/19/23 who discussed the possibility of progression of his aortic stenosis. Echo has not yet been performed as Dr. Kirke Corin was not sure the patient was a candidate for further testing due to advanced age, frequent falls, frailty and Parkinson's disease.   He tells me today that he has had significant improvement in his LE edema and dyspnea since his Lasix dose was increased. He denies chest pain, dizziness, near syncope or syncope. He has had a 20 lb weight loss over the past two years. He has had 5 falls over the last year due to balance issues but no clear syncope. No recent falls. He lives in [place], [place] with his wife. He is retired a Emergency planning/management officer. He has no active dental issues.   Primary Care Physician: Joselyn Arrow, MD Primary Cardiologist: Kirke Corin Referring Cardiologist: Kirke Corin  Past Medical History:  Diagnosis Date   Acute CVA (cerebrovascular accident) [facility])    Acute encephalopathy 11/27/2014   Aortic atherosclerosis (HCC) 11/25/2014   Atrial fibrillation (HCC)    a. 12/2018 [facility]: Sinus rhythm, avg rate 71. 2 runs of VT (max 17 beats). 27 runs SVT (max 14 beats). Occas  PACs/PVCs. No high grade AV block/pauses.   Chronic combined systolic (congestive) and diastolic (congestive) heart failure (HCC)    a. 10/2014 Echo: EF 40-45%; b. 03/2018 Echo: EF 55-60%; c. 12/2018 Echo: EF 55-60%, mild conc LVH. Nl RV fxn. RVSP 51.60mmHg. Mildly dil LA. Mod AS w/ severe Ca2+; d. 12/2019 Echo: EF 50-55%, mild LVH, gr2 DD. Sev elev PASP. Mild MR, mild to mod TR. Triv AI, mod to sev AS (area 0.77cm^2, mean grad . Vmax 2.89m/s).   Diabetes mellitus    Hypercholesteremia    Hypertension    Impaired fasting glucose    Macular degeneration    Moderate to severe aortic stenosis    a. 12/2018 Echo: EF 55-60%, Mod AS w/ sev AoV Ca2+; b. 12/2019 Echo: EF 50-55%. Mod to sev AS (area 0.77cm^2, mean grad . Vmax 2.35m/s).   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 Rhea Belton)   RMSF [facility] spotted fever) 10/2014   (hosp with FUO, encephalopathy)   Rotator cuff tear, right 10/2010   supraspinatous and infraspinatous   Seizure disorder (HCC) 07/25/2019   TIA (transient ischemic attack) 10/02/13   hosp at [facility] x 1 night   Type 2 diabetes mellitus with microalbuminuria or microproteinuria 01/15/2014    Past Surgical History:  Procedure Laterality Date   CATARACT EXTRACTION Right 01/2017   CIRCUMCISION  01/2017   Dr. Earlene Plater  COLONOSCOPY N/A 11/28/2012   Procedure: COLONOSCOPY;  Surgeon: Beverley Fiedler, MD;  Location: [facility];  Service: Gastroenterology;  Laterality: N/A;   INTERNAL URETHROTOMY  01/27/2016   WF   prostate seed implant     ROTATOR CUFF REPAIR  06/16/2011   right (Dr. Luiz Blare)   TONSILLECTOMY AND ADENOIDECTOMY  age 45    Current Outpatient Medications  Medication Sig Dispense Refill   acetaminophen (TYLENOL) 500 MG tablet Take 500 mg by mouth 2 (two) times daily.     apixaban  (ELIQUIS) 2.5 MG TABS tablet Take 1 tablet (2.5 mg total) by mouth 2 (two) times daily. 60 tablet 11   atorvastatin (LIPITOR) 20 MG tablet Take 20 mg by mouth daily.     Cyanocobalamin (VITAMIN B 12 PO) Take 1,000 mcg by mouth.     furosemide (LASIX) 40 MG tablet Take 1 tablet (40 mg total) by mouth daily. 90 tablet 1   ipratropium (ATROVENT) 0.03 % nasal spray Place 2 sprays into both nostrils 3 (three) times daily as needed for rhinitis. 30 mL 12   Iron, Ferrous Sulfate, 325 (65 Fe) MG TABS Take by mouth.     levETIRAcetam (KEPPRA XR) 500 MG 24 hr tablet TAKE ONE TABLET BY MOUTH EVERY DAY 30 tablet 5   loperamide (IMODIUM) 2 MG capsule Take 2 mg by mouth as needed for diarrhea or loose stools.     loratadine (CLARITIN) 10 MG tablet Take by mouth.     mirabegron ER (MYRBETRIQ) 50 MG TB24 tablet Take 1 tablet (50 mg total) by mouth daily. 90 tablet 3   Multiple Vitamins-Minerals (PRESERVISION AREDS) CAPS Take 1 capsule by mouth 2 (two) times daily.      potassium chloride SA (KLOR-CON M) 20 MEQ tablet Take 20 mEq by mouth daily.     UNABLE TO FIND Bevacizumab(AVASTIN) chemo injection for the eye every 10 weeks     No current facility-administered medications for this visit.    No Known Allergies  Social History   Socioeconomic History   Marital status: Married    Spouse name: Fleet Contras   Number of children: 4   Years of education: Not on file   Highest education level: Not on file  Occupational History   Occupation: Retired   Occupation: Retired Emergency planning/management officer  Tobacco Use   Smoking status: Former    Current packs/day: 0.00    Average packs/day: 1 pack/day for 20.0 years (20.0 ttl pk-yrs)    Types: Cigarettes    Start date: 05/31/1960    Quit date: 05/31/1980    Years since quitting: 43.1   Smokeless tobacco: Never  Vaping Use   Vaping status: Never Used  Substance and Sexual Activity   Alcohol use: Not Currently    Alcohol/week: 3.0 standard drinks of alcohol    Types: 3 Cans of  beer per week    Comment: e beer 3x/week   Drug use: No   Sexual activity: Not Currently    Partners: Female    Comment: issues with ED  Other Topics Concern   Not on file  Social History Narrative   Married. Retired Emergency planning/management officer.  2 sons, 2 daughters (all in [place]), 15+ grandchildren, 9 great grandchildren   Lives with wife, 1 dog.   Right Handed   Drinks 2 cups of caffeine daily      Updated 08/2022   Social Drivers of Health   Financial Resource Strain: Low Risk  (05/07/2021)   Overall Financial Resource Strain (  CARDIA)    Difficulty of Paying Living Expenses: Not very hard  Food Insecurity: Not on file  Transportation Needs: No Transportation Needs (05/07/2021)   PRAPARE - Transportation    Lack of Transportation (Medical): No    Lack of Transportation (Non-Medical): No  Physical Activity: Not on file  Stress: Not on file  Social Connections: Not on file  Intimate Partner Violence: Not on file    Family History  Problem Relation Age of Onset   Dementia Mother    Diabetes Mother    Heart disease Mother    Hypertension Mother    Heart disease Father 50       Died suddenly.  No diagnosis   Stroke Sister    Cancer Daughter        ovarian (?) vs other male cancer; s/p hyst doing well   Diabetes Daughter        GDM, and now AODM   Heart disease Son        congestive heart failure--improved   Congestive Heart Failure Grandchild     Review of Systems:  As stated in the HPI and otherwise negative.   BP 118/76   Pulse 71   Ht 5\' 11"  (1.803 m)   Wt 59.2 kg   SpO2 95%   BMI 18.22 kg/m   Physical Examination: General: Well developed, well nourished, NAD  HEENT: OP clear, mucus membranes moist  SKIN: warm, dry. No rashes. Neuro: No focal deficits  Musculoskeletal: Muscle strength 5/5 all ext  Psychiatric: Mood and affect normal  Neck: No JVD, no carotid bruits, no thyromegaly, no lymphadenopathy.  Lungs:Clear bilaterally, no wheezes, rhonci,  crackles Cardiovascular: Regular rate and rhythm. Late peaking systolic murmur.  Abdomen:Soft. Bowel sounds present. Non-tender.  Extremities: No lower extremity edema.  EKG:  EKG is not ordered today. The ekg ordered today demonstrates   Recent Labs: 11/10/2022: TSH 2.230 07/14/2023: ALT 17; BNP 1,959.9; BUN 20; Creatinine, Ser 1.07; Hemoglobin CANCELED; Platelets CANCELED; Potassium 4.6; Sodium 145   Lipid Panel    Component Value Date/Time   CHOL 155 09/23/2022 0830   CHOL 184 10/01/2013 1719   TRIG 87 09/23/2022 0830   TRIG 400 (H) 10/01/2013 1719   HDL 64 09/23/2022 0830   HDL 55 10/01/2013 1719   CHOLHDL 2.4 09/23/2022 0830   CHOLHDL 2.3 05/27/2017 1007   VLDL 28 06/02/2016 0714   VLDL 80 (H) 10/01/2013 1719   LDLCALC 75 09/23/2022 0830   LDLCALC 78 05/27/2017 1007   LDLCALC 49 10/01/2013 1719     Wt Readings from Last 3 Encounters:  08/01/23 59.2 kg  07/19/23 62.6 kg  07/14/23 63.5 kg     Assessment and Plan:   1. Aortic Valve Stenosis: He had moderate aortic stenosis on echo in July 2023. He has had recent volume overload and suspicion that his AS has progressed. Dr. Kirke Corin has arranged a repeat echo for next week. He is NYHA class 3. He is limited in his mobility due to his Parkinson's and has balance issues. Failure to thrive with 20 lb weight loss over the past 2 years. He reports mild dyspnea with exertion. His wife and daughter are with him today. They tell me that he sits around most of the day. He has had significant symptomatic improvement since his Lasix was increased.  Echo is pending so no data yet to review with the family on severity of AS. I have presented information to them today as if he has severe AS.  If we were to find severe AS on his echo next week, the question would be if he is a candidate for AVR. He is not a candidate for surgical AVR. He may not be a candidate for TAVR given his frailty, advanced age and Parkinson's. The patient is not sure that  he would go through with TAVR and would prefer conservative management if possible. His wife and daughter would like [place] to be aggressive if TAVR is an option.    I have reviewed the natural history of aortic stenosis with the patient and their family members  who are present today. We have discussed the limitations of medical therapy and the poor prognosis associated with symptomatic aortic stenosis. We have reviewed potential treatment options, including palliative medical therapy, conventional surgical aortic valve replacement, and transcatheter aortic valve replacement. We discussed treatment options in the context of the patient's specific comorbid medical conditions.   I will review his echo when it is completed next week. After reviewing his echo, will review his case with our Multi-disciplinary structural heart team and we will decide his candidacy for TAVR. My personal opinion today is that he is a poor candidate for TAVR.   Labs/ tests ordered today include:  No orders of the defined types were placed in this encounter.   Disposition:   F/U will be arranged with the structural team  Signed, Verne Carrow, MD, [facility] 08/01/2023 12:20 PM    [facility] [address] [place], [place], [place]  [address] Phone: [phone]; Fax: [phone]

## 2023-08-04 ENCOUNTER — Ambulatory Visit: Admitting: Podiatry

## 2023-08-11 ENCOUNTER — Ambulatory Visit: Payer: Medicare Other | Attending: Cardiovascular Disease

## 2023-08-11 DIAGNOSIS — I350 Nonrheumatic aortic (valve) stenosis: Secondary | ICD-10-CM | POA: Diagnosis not present

## 2023-08-11 LAB — ECHOCARDIOGRAM COMPLETE
AR max vel: 0.54 cm2
AV Area VTI: 0.37 cm2
AV Area mean vel: 0.5 cm2
AV Mean grad: 26 mmHg
AV Peak grad: 43.6 mmHg
Ao pk vel: 3.3 m/s
Area-P 1/2: 3.85 cm2
Radius: 0.5 cm
S' Lateral: 3.7 cm

## 2023-08-16 ENCOUNTER — Ambulatory Visit (INDEPENDENT_AMBULATORY_CARE_PROVIDER_SITE_OTHER): Admitting: Podiatry

## 2023-08-16 DIAGNOSIS — B351 Tinea unguium: Secondary | ICD-10-CM

## 2023-08-16 DIAGNOSIS — M79674 Pain in right toe(s): Secondary | ICD-10-CM | POA: Diagnosis not present

## 2023-08-16 DIAGNOSIS — M79675 Pain in left toe(s): Secondary | ICD-10-CM | POA: Diagnosis not present

## 2023-08-16 DIAGNOSIS — E1151 Type 2 diabetes mellitus with diabetic peripheral angiopathy without gangrene: Secondary | ICD-10-CM

## 2023-08-16 NOTE — Progress Notes (Signed)
 This patient returns to my office for at risk foot care.  This patient requires this care by a professional since this patient will be at risk due to having type 2 diabetes     This patient is unable to cut nails himself since the patient cannot reach his nails.These nails are painful walking and wearing shoes.  This patient presents for at risk foot care today.  General Appearance  Alert, conversant and in no acute stress.  Vascular  Dorsalis pedis and posterior tibial  pulses are  weakly palpable  bilaterally.  Capillary return is within normal limits  bilaterally. Temperature is within normal limits  bilaterally.  Neurologic  Senn-Weinstein monofilament wire test within normal limits  bilaterally. Muscle power within normal limits bilaterally.  Nails Thick disfigured discolored nails with subungual debris  from hallux to fifth toes bilaterally. No evidence of bacterial infection or drainage bilaterally.  Subungual hematoma which is dried with no fluctuation.  Orthopedic  No limitations of motion  feet .  No crepitus or effusions noted.  No bony pathology or digital deformities noted.  Skin  normotropic skin with no porokeratosis noted bilaterally.  No signs of infections or ulcers noted.     Onychomycosis  Pain in right toes  Pain in left toes   Consent was obtained for treatment procedures.   Mechanical debridement of nails 1-5  bilaterally performed with a nail nipper.  Filed with dremel without incident.     Return office visit   4  months.                  Told patient to return for periodic foot care and evaluation due to potential at risk complications.  Nicholes Rough D.P.M.

## 2023-08-19 ENCOUNTER — Encounter: Payer: Self-pay | Admitting: *Deleted

## 2023-09-06 ENCOUNTER — Other Ambulatory Visit: Payer: Self-pay | Admitting: Neurology

## 2023-09-06 ENCOUNTER — Other Ambulatory Visit: Payer: Self-pay | Admitting: Cardiovascular Disease

## 2023-09-08 DIAGNOSIS — H353231 Exudative age-related macular degeneration, bilateral, with active choroidal neovascularization: Secondary | ICD-10-CM | POA: Diagnosis not present

## 2023-09-22 ENCOUNTER — Encounter: Payer: Self-pay | Admitting: *Deleted

## 2023-09-25 NOTE — Progress Notes (Unsigned)
 No chief complaint on file.   Jeffrey [name] is a 89 y.o. male who presents for annual wellness visit and follow-up on chronic medical conditions.    Atrial fibrillation, CHF, pulmonary hypertension, hypertension and aortic stenosis: He denies tachycardia, palpitations.  He is on blood thinners and denies bleeding. He last saw Dr. Alvenia Aus in 07/2023, after seeing me with CHF/fluid overload. He was treated with lasix , saw Dr. Alvenia Aus for f/u, and ultimately had repeat echo which showed significant decline in EF (to 30-35% on 07/2023 echo), and severe AS.  He saw Dr. Abel Hoe for follow-up, and didn't feel he was a good candidate for TAVR (per messages in chart).  His symptoms improved significantly with diuresis. He continues on 40 mg of furosemide  daily.  Echo 07/2023: IMPRESSIONS   1. Left ventricular ejection fraction, by estimation, is 30 to 35%. Left  ventricular ejection fraction by 3D volume is 33 %. The left ventricle has  moderately decreased function. The left ventricle demonstrates global  hypokinesis. There is mild left  ventricular hypertrophy. Left ventricular diastolic parameters are  indeterminate. The average left ventricular global longitudinal strain is  -5.1 %. The global longitudinal strain is abnormal.   2. Right ventricular systolic function is mildly reduced. The right  ventricular size is normal.   3. Left atrial size was mildly dilated.   4. The mitral valve is normal in structure. Mild to moderate mitral valve  regurgitation. No evidence of mitral stenosis.   5. The aortic valve is normal in structure. There is severe calcifcation  of the aortic valve. Aortic valve regurgitation is not visualized.  Moderate to severe aortic valve stenosis. Aortic valve area, by VTI  measures 0.37 cm. Aortic valve mean gradient   measures 26.0 mmHg. Aortic valve Vmax measures 3.30 m/s. Degree of valve  stenosis may be underestimated secondary to depressed EF.   6. The inferior vena  cava is normal in size with greater than 50%  respiratory variability, suggesting right atrial pressure of 3 mmHg.   Comparison(s): EF has decreased significantly. Low flow/ low gradient AS  is present.   IFG--He doesn't check his sugars.  Last A1c was 5.8% in 03/2023, down from 6.1% in 08/2022. At April 2024 visit he reported eating a lot of sweets--ice cream, cakes, pies.  He still has a small amount most days. He was drinking 2-3 glasses of sweet tea daily, only infrequent beer, and only 2 glasses of water/day.  Today he reports *** Water Beer Sweet tea     Microalbuminuria-- noted in 04/2017. Last check was elevated, with a ratio of 56 in 03/2023 (up from 19 in 01/2022). Lisinopril  (40mg ) was stopped in 01/2019, as carvedilol  dose had been increased due to frequency of ectopy.  Amlodipine  had also been stopped at that time, pt also had issues with orthostasis.   Component Ref Range & Units (hover) 6 mo ago 1 yr ago 3 yr ago 4 yr ago 5 yr ago 6 yr ago 7 yr ago  Creatinine, Urine 127.9 192.7 106.8 180.4 72.0    Microalbumin, Urine 71.8 37.2 16.2 56.4 10.6    Microalb/Creat Ratio 56 High  19 CM 15 CM 31 High  CM 15 CM 132 High  R, CM 32 High  R, CM    Hyperlipidemia:  He is compliant with atorvastatin  20 mg daily and denies side effects (switched from lovastatin  to atorvastatin  by cardiologist in 05/2021).  Tries to follow a lowfat, low cholesterol diet. Lipids were at goal  on last check, on this medication, due for recheck.  Lab Results  Component Value Date   CHOL 155 09/23/2022   HDL 64 09/23/2022   LDLCALC 75 09/23/2022   TRIG 87 09/23/2022   CHOLHDL 2.4 09/23/2022    H/o Stroke (10/2014) and seizures. 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 He is under the care of Dr. Janett Medin. He had a barium swallow study 11/22/2022 which found moderate oropharyngeal dysphagia with silent aspiration of thin  liquids noted. Recommendation was for dysphagia 2 diet and mildly thick liquids with full supervision.   He saw Dr. Omar Bibber in 06/2023 for 2nd opinion on Parkinson's.  They elected not to re-try Sinimet. He was reminded to work on his strengthening exercises, use his walker at all times, stay well-hydrated with water, avoid caffeine (he was drinking at least 3 cups/d of iced tea with meals).   He remains on Eliquis  for anticoagulation related to afib for prevention of further strokes.  He denies any bleeding, bruising (just related to falls) He has had B12 deficiency, and is compliant with taking a supplement daily. Last level was normal in 04/2022.  He goes to the eye doctor regularly, for macular degeneration (Dr. Greven), Avastin  injections to right eye.  Vision is stable in the right eye, very poor in the left.    Prostate cancer and nocturia:  He is under care of [facility].  PSAs have been undetectable (last checked 09/2022).  They prescribe Myrbetriq  50mg .  He is getting up ***   They felt that untreated OSA is likely contributing to his nocturia, and encouraged him to re-try CPAP (and offered pulm referral). He states the "treatment is worse than the cure", couldn't tolerate two different masks, and prefers not to try this again.  Lab Results  Component Value Date   PSA1 <0.1 10/06/2022   PSA1 <0.1 07/16/2021   PSA1 <0.1 09/08/2020    Shoulder pain.  He had pain L>R, worse since his fall in 07/2021. Had series of 3 bilateral glenohumeral Orthovisc injections by ortho, without any benefit on the left, did help the pain on the right shoulder some.  Since he didn't want surgical treatment, rather than MRI, ortho referred him to pain management (all through [facility]). Never saw pain management.   He reports he still has pain, L>R, but it is tolerable right now. Tylenol  helps.  He doesn't feel that the pain is affecting his quality of life.    Immunization History   Administered Date(s) Administered   Fluad Quad(high Dose 65+) 04/16/2022   Fluad Trivalent(High Dose 65+) 03/14/2023   Influenza Split 01/30/2012, 02/03/2014, 03/25/2015   Influenza, High Dose Seasonal PF 03/17/2016, 03/04/2017, 02/22/2018, 02/29/2020, 02/19/2021   Influenza-Unspecified 01/29/2013, 03/25/2015, 03/14/2019   PFIZER Comirnaty Aaron AasGray Top)Covid-19 Tri-Sucrose Vaccine 09/08/2020   PFIZER(Purple Top)SARS-COV-2 Vaccination 06/06/2019, 06/27/2019, 03/07/2020   PNEUMOCOCCAL CONJUGATE-20 09/20/2022   Pfizer Covid-19 Vaccine Bivalent Booster 74yrs & up 02/19/2021   Pfizer(Comirnaty )Fall Seasonal Vaccine 12 years and older 04/16/2022, 03/14/2023   Pneumococcal Conjugate-13 10/17/2014   Pneumococcal Polysaccharide-23 12/17/2004, 10/02/2012   Td 10/29/2004   Tdap 07/10/2021   Last colonoscopy: 11/2012 with Dr. Bridgett Camps (radiation proctitis)   Last PSA: per urologist, <0.01 09/2022 Dentist: past due, >2 years, dentist retired.  *** Ophtho: Sees Dr. Greven every 10 weeks. Exercise:    Not much. Walks the dog twice a day for 10-15 minutes. Admits it is not aerobic (stops/sniffs a lot,  slow).  No longer uses any hand weights.   Patient Care Team: Roosvelt Colla, MD as PCP - General (Family Medicine) Wenona Hamilton, MD as PCP - Cardiology (Cardiology) Wenona Hamilton, MD as Consulting Physician (Cardiology) Lisabeth Rider, MD as Consulting Physician (Neurology) Alver Austin, [facility] (Inactive) as Pharmacist (Pharmacist) GI--Dr. Bridgett Camps  (no longer sees) Urologist: [facility]  Dentist: Dr.Taloopis (retired, doesn't have new one yet) Ophtho: Dr. Greven (at [facility]), Dr. Audree Leas: [facility] Darleene Ege) Pulmonary: Dr. Woodfin Hays (no longer sees) Vascular: Dr. Nolene Baumgarten Derm:  Dr. Tresa Frohlich in [place] Podiatrist: Dr. [facility]--had consult with Dr. Carren Civatte 2nd opinion from Dr. Omar Bibber.   Depression Screening: Flowsheet Row Office Visit from 09/20/2022 in  [facility]  PHQ-2 Total Score 0       Falls screen:     09/20/2022    1:26 PM 02/03/2022   10:22 AM 09/09/2021    1:25 PM 03/11/2021   11:19 AM 02/18/2021    2:20 PM  Fall Risk   Falls in the past year? 1 1 1  0 0  Number falls in past yr: 1 1 1  0 0  Comment  2/23 tripped w/new boots and fell backwards, 01/09/22 fell in [employer] parking lot, passed out. Wife states he falls so often every week or so she cannot keep count nor does she know any of the dates.     Injury with Fall? 0 1 1 0 0  Comment 01/09/22-fell at [employer], numerous other falls after that, 2/24 got dizzy and fell at the house and brusied left arm. 2/23-hurt L shoulder, 01/09/22 scrapes and brusies     Risk for fall due to : History of fall(s) History of fall(s) Impaired balance/gait No Fall Risks No Fall Risks  Follow up Falls evaluation completed Falls evaluation completed Falls evaluation completed Falls evaluation completed Falls evaluation completed     Functional Status Survey:           End of Life Discussion:  He has been given forms in the past, but still hasn't done.     PMH, PSH, SH and FH were reviewed and updated   ROS: The patient denies anorexia, fever, headaches, ear pain, hoarseness, chest pain, palpitations, dyspnea on exertion, cough, swelling, nausea, vomiting, abdominal pain, melena, hematochezia, indigestion/heartburn, dysuria, genital lesions, numbness, tingling, weakness, suspicious skin lesions, depression, anxiety,  or enlarged lymph nodes.   +bilateral hearing loss Vision loss related to macular degeneration. Worse on the L, improving/stable on the R. Has some urge incontinence, some dribbling; frequency during the day, worse at night.  Recently has been 5-6x/night. No dysuria, hematuria. No bleeding. Some easy bruising (arms). No further episodes staring spells since on Keppra  Some constipation intermittently, occ diarrhea.  Denies blood in the stools. Memory isn't as  good, about the same as last year. Tremor is a little worse, per pt (hands, possibly feet; doesn't notice in his head/jaw). L shoulder pain per HPI, tolerable. Chronic runny nose, clear.  Denies sinus pain. +fatigue--dozes off within 10 minutes in a chair. +snoring. Sleeps on his side. Weight***   PHYSICAL EXAM:  There were no vitals taken for this visit.  Wt Readings from Last 3 Encounters:  08/01/23 130 lb 9.6 oz (59.2 kg)  07/19/23 138 lb (62.6 kg)  07/14/23 140 lb (63.5 kg)   Wt 133# 12.8 oz in 04/2022 Wt 139# 12.8 oz in 08/2021 Wt 149# 12.8 oz in 02/2021 Wt 152# 3.2 oz 08/2020  General Appearance:   Alert, cooperative, no distress, appears stated age. Frail-appearing elderly male. He declined to change into gown for today's visit.  Head:   Normocephalic, without obvious abnormality, atraumatic    Eyes:   PERRL, conjunctiva/corneas clear, EOM's intact. Fundi not examined  Ears:   Normal TM's and external ear canals. No cerumen present  Nose:   Normal without drainage, no sinus tenderness  Throat:   Clear without lesions  Neck:   Supple, no lymphadenopathy; thyroid : no enlargement/tenderness/nodules; bruit noted bilaterally (vs radiation of murmur).   Back:   Spine nontender, no curvature, ROM normal, no CVA tenderness    Lungs:   Clear to auscultation bilaterally without wheezes, rales or ronchi; respirations unlabored    Chest Wall:   No tenderness or deformity    Heart:   Regular rhythm, 3/6 SEM heard throughout, harsh-sounding, radiates to carotids.  Breast Exam:   No chest wall tenderness, masses or gynecomastia    Abdomen:   Soft, non-tender, nondistended, normoactive bowel sounds, no masses, no hepatosplenomegaly.   Genitalia:   Deferred to urologist    Rectal:   Deferred to urologist  Extremities:   No clubbing, cyanosis or edema.     Pulses:   2+ and symmetric all extremities    Skin:   Skin color, texture, turgor normal, no rashes or lesions; Large SK on right face,  unchanged.  Purpura on forearms, L elbow. AK's on forearms, ears. Full skin exam not performed, patient not in gown for visit. ***  Lymph nodes:   Cervical, supraclavicular nodes normal    Neurologic:   Normal strength, sensation and gait; reflexes 2+ and symmetric throughout. Only very slight resting tremor in L hand noted today, also noted in head/lower jaw, mild. ***                       Psych:  Normal mood, affect, eye contact, speech.    ***UPDATE SKIN, tremor DIABETIC FOOT EXAM    ASSESSMENT/PLAN:   Did he ever get RSV vaccine?  If not, should get in the Fall (from pharmacy). Did he ever get shingrix?  Very important to do living will and healthcare POA. Given forms MANY times in the past.  Do they have completed? Need new forms?  Doesn't need to get into gown. Needs diabetic foot exam     ?would he tolerate low dose ACEI? To check with his cardiologist Dr. Alvenia Aus?  Recommended at least 30 minutes of aerobic activity at least 5 days/week, weight-bearing exercise 2x/wk; proper sunscreen use reviewed; healthy diet and alcohol recommendations (1 drink/day or less preferred, occasionally 2) reviewed; regular seatbelt use; changing batteries in smoke detectors. Immunization recommendations--encouraged him to get Shingrix from pharmacy, SE reviewed.  Continue yearly flu shots. RSV is recommended to get in the Fall. Colonoscopy recommendations reviewed--UTD, no longer recommended to have screening.   MOST form completed, no changes. Full Code, Full care. Discussed living will and healthcare POA again, and encouraged completion and to give us  copies to scan into medical chart. ***    Medicare Attestation I have personally reviewed: The patient's medical and social history Their use of alcohol, tobacco or illicit drugs Their current medications and supplements The patient's functional ability including ADLs,fall risks, home safety risks, cognitive, and hearing and visual  impairment Diet and physical activities Evidence for depression or mood disorders  The patient's weight, height, BMI have been recorded in the chart.  I have made referrals, counseling,  and provided education to the patient based on review of the above and I have provided the patient with a written personalized care plan for preventive services.

## 2023-09-25 NOTE — Patient Instructions (Incomplete)
 HEALTH MAINTENANCE RECOMMENDATIONS:  It is recommended that you get at least 30 minutes of aerobic exercise at least 5 days/week (for weight loss, you may need as much as 60-90 minutes). This can be any activity that gets your heart rate up. This can be divided in 10-15 minute intervals if needed, but try and build up your endurance at least once a week.  Weight bearing exercise is also recommended twice weekly.  Eat a healthy diet with lots of vegetables, fruits and fiber.  "Colorful" foods have a lot of vitamins (ie green vegetables, tomatoes, red peppers, etc).  Limit sweet tea, regular sodas and alcoholic beverages, all of which has a lot of calories and sugar.  Up to 2 alcoholic drinks daily may be beneficial for men (unless trying to lose weight, watch sugars).  Drink a lot of water.  Sunscreen of at least SPF 30 should be used on all sun-exposed parts of the skin when outside between the hours of 10 am and 4 pm (not just when at beach or pool, but even with exercise, golf, tennis, and yard work!)  Use a sunscreen that says "broad spectrum" so it covers both UVA and UVB rays, and make sure to reapply every 1-2 hours.  Remember to change the batteries in your smoke detectors when changing your clock times in the spring and fall.  Carbon monoxide detectors are recommended for your home.  Use your seat belt every time you are in a car, and please drive safely and not be distracted with cell phones and texting while driving.    Jeffrey [name] , Thank you for taking time to come for your Medicare Wellness Visit. I appreciate your ongoing commitment to your health goals. Please review the following plan we discussed and let me know if I can assist you in the future.   This is a list of the screening recommended for you and due dates:  Health Maintenance  Topic Date Due   Zoster (Shingles) Vaccine (1 of 2) Never done   Eye exam for diabetics  08/29/2017   COVID-19 Vaccine (8 - Pfizer risk 2024-25  season) 09/12/2023   Hemoglobin A1C  09/12/2023   Complete foot exam   09/16/2023   Flu Shot  12/30/2023   Medicare Annual Wellness Visit  09/25/2024   DTaP/Tdap/Td vaccine (3 - Td or Tdap) 07/11/2031   Pneumonia Vaccine  Completed   HPV Vaccine  Aged Out   Meningitis B Vaccine  Aged Out   You are eligible to get another COVID booster (has been 6 months since your last one). You can get this now from the pharmacy.  I recommend getting the RSV vaccine in the Fall (from the pharmacy). You can get this the same day as your flu shot, or separate them by 2 weeks. The updated COVID booster is also recommended in the Fall.  Shingles vaccines is recommended--a series of 2 shots given 2 months apart. You need to get these from the pharmacy.  Please get back to checking your weight every day, and contacting your cardiologist's office with fluctuations as they have previously directed.   Please be sure to eat more protein and fiber in your diet. Use whole grains as much as possible (whole wheat bread, brown rice, whole grain pasta). Please drink more water and less sweet tea (which is a lot of sugar). Continue to use the decaff tea if/when having tea. Please try and limit the beer, because you need to be drinking more  water instead. Please never skip meals.  You can use shakes as supplements or in place of meals if you truly aren't hungry.  We will have a dietician help you with your meal planning. Also to help you with regard to your trouble swallowing. You are supposed to be thickening your liquids.  Please try and do home exercises to try and maintain your strength--please find the sheets you have at home from prior physical therapy.  Please arrange for routine dental care. Bone Health Information for Aging Adults Your bones do more than support your body. They also store calcium . The inside of your bones (marrow) makes blood cells. Maintaining bone health becomes more important as you age  because your bones replace all their cells about every 10 years. Around age 42, it gets harder to replace those cells, and bones can become weak. Weak bones can lead to osteoporosis and breaks (fractures). Falls also become more likely, which can cause fractures. The good news is that, with diet and exercise, you can improve and maintain your bone health at any age.  We discussed the potassium.  You can try doing the 1/2 twice daily, vs check with the pharmacist about which pills are smaller, or switch to the liquid.    Please get a routine hearing screening, and consider hearing aids, if appropriate. [employer] and [employer] come highly recommended. Brain exercises (puzzles, crosswords, etc) and physical exercises are also very important for brain health.

## 2023-09-26 ENCOUNTER — Encounter: Payer: Self-pay | Admitting: Family Medicine

## 2023-09-26 ENCOUNTER — Ambulatory Visit: Payer: Medicare Other | Admitting: Family Medicine

## 2023-09-26 VITALS — BP 134/80 | HR 80 | Ht 71.0 in | Wt 121.4 lb

## 2023-09-26 DIAGNOSIS — E7800 Pure hypercholesterolemia, unspecified: Secondary | ICD-10-CM

## 2023-09-26 DIAGNOSIS — I480 Paroxysmal atrial fibrillation: Secondary | ICD-10-CM

## 2023-09-26 DIAGNOSIS — G40909 Epilepsy, unspecified, not intractable, without status epilepticus: Secondary | ICD-10-CM

## 2023-09-26 DIAGNOSIS — E46 Unspecified protein-calorie malnutrition: Secondary | ICD-10-CM

## 2023-09-26 DIAGNOSIS — I509 Heart failure, unspecified: Secondary | ICD-10-CM

## 2023-09-26 DIAGNOSIS — Z0000 Encounter for general adult medical examination without abnormal findings: Secondary | ICD-10-CM

## 2023-09-26 DIAGNOSIS — R809 Proteinuria, unspecified: Secondary | ICD-10-CM

## 2023-09-26 DIAGNOSIS — H353290 Exudative age-related macular degeneration, unspecified eye, stage unspecified: Secondary | ICD-10-CM

## 2023-09-26 DIAGNOSIS — E1129 Type 2 diabetes mellitus with other diabetic kidney complication: Secondary | ICD-10-CM

## 2023-09-26 DIAGNOSIS — G20C Parkinsonism, unspecified: Secondary | ICD-10-CM | POA: Diagnosis not present

## 2023-09-26 DIAGNOSIS — I38 Endocarditis, valve unspecified: Secondary | ICD-10-CM

## 2023-09-26 DIAGNOSIS — G4733 Obstructive sleep apnea (adult) (pediatric): Secondary | ICD-10-CM

## 2023-09-26 DIAGNOSIS — Z7901 Long term (current) use of anticoagulants: Secondary | ICD-10-CM | POA: Diagnosis not present

## 2023-09-26 DIAGNOSIS — Z5181 Encounter for therapeutic drug level monitoring: Secondary | ICD-10-CM

## 2023-09-26 DIAGNOSIS — R351 Nocturia: Secondary | ICD-10-CM

## 2023-09-26 DIAGNOSIS — E119 Type 2 diabetes mellitus without complications: Secondary | ICD-10-CM | POA: Diagnosis not present

## 2023-09-26 DIAGNOSIS — I10 Essential (primary) hypertension: Secondary | ICD-10-CM

## 2023-09-26 DIAGNOSIS — D649 Anemia, unspecified: Secondary | ICD-10-CM | POA: Diagnosis not present

## 2023-09-26 DIAGNOSIS — H353190 Nonexudative age-related macular degeneration, unspecified eye, stage unspecified: Secondary | ICD-10-CM

## 2023-09-26 DIAGNOSIS — Z681 Body mass index (BMI) 19 or less, adult: Secondary | ICD-10-CM

## 2023-09-26 DIAGNOSIS — R636 Underweight: Secondary | ICD-10-CM

## 2023-09-26 DIAGNOSIS — H9193 Unspecified hearing loss, bilateral: Secondary | ICD-10-CM

## 2023-09-26 LAB — LIPID PANEL

## 2023-09-26 LAB — POCT GLYCOSYLATED HEMOGLOBIN (HGB A1C): Hemoglobin A1C: 5.9 % — AB (ref 4.0–5.6)

## 2023-09-27 ENCOUNTER — Encounter: Payer: Self-pay | Admitting: Family Medicine

## 2023-09-27 LAB — CMP14+EGFR
ALT: 6 IU/L (ref 0–44)
AST: 15 IU/L (ref 0–40)
Albumin: 4.2 g/dL (ref 3.7–4.7)
Alkaline Phosphatase: 68 IU/L (ref 44–121)
BUN/Creatinine Ratio: 25 — ABNORMAL HIGH (ref 10–24)
BUN: 38 mg/dL — ABNORMAL HIGH (ref 8–27)
Bilirubin Total: 0.6 mg/dL (ref 0.0–1.2)
CO2: 22 mmol/L (ref 20–29)
Calcium: 9.7 mg/dL (ref 8.6–10.2)
Chloride: 101 mmol/L (ref 96–106)
Creatinine, Ser: 1.53 mg/dL — ABNORMAL HIGH (ref 0.76–1.27)
Globulin, Total: 2.9 g/dL (ref 1.5–4.5)
Glucose: 104 mg/dL — ABNORMAL HIGH (ref 70–99)
Potassium: 5.1 mmol/L (ref 3.5–5.2)
Sodium: 141 mmol/L (ref 134–144)
Total Protein: 7.1 g/dL (ref 6.0–8.5)
eGFR: 43 mL/min/{1.73_m2} — ABNORMAL LOW (ref >59)

## 2023-09-27 LAB — CBC WITH DIFFERENTIAL/PLATELET
Basophils Absolute: 0 10*3/uL (ref 0.0–0.2)
Basos: 0 %
EOS (ABSOLUTE): 0 10*3/uL (ref 0.0–0.4)
Eos: 0 %
Hematocrit: 34.8 % — ABNORMAL LOW (ref 37.5–51.0)
Hemoglobin: 11.1 g/dL — ABNORMAL LOW (ref 13.0–17.7)
Immature Grans (Abs): 0 10*3/uL (ref 0.0–0.1)
Immature Granulocytes: 0 %
Lymphocytes Absolute: 1.2 10*3/uL (ref 0.7–3.1)
Lymphs: 16 %
MCH: 30.8 pg (ref 26.6–33.0)
MCHC: 31.9 g/dL (ref 31.5–35.7)
MCV: 97 fL (ref 79–97)
Monocytes Absolute: 0.8 10*3/uL (ref 0.1–0.9)
Monocytes: 10 %
Neutrophils Absolute: 5.3 10*3/uL (ref 1.4–7.0)
Neutrophils: 74 %
Platelets: 308 10*3/uL (ref 150–450)
RBC: 3.6 x10E6/uL — ABNORMAL LOW (ref 4.14–5.80)
RDW: 13.7 % (ref 11.6–15.4)
WBC: 7.2 10*3/uL (ref 3.4–10.8)

## 2023-09-27 LAB — LIPID PANEL
Cholesterol, Total: 142 mg/dL (ref 100–199)
HDL: 61 mg/dL (ref >39)
LDL CALC COMMENT:: 2.3 ratio (ref 0.0–5.0)
LDL Chol Calc (NIH): 66 mg/dL (ref 0–99)
Triglycerides: 76 mg/dL (ref 0–149)
VLDL Cholesterol Cal: 15 mg/dL (ref 5–40)

## 2023-09-28 ENCOUNTER — Telehealth: Payer: Self-pay | Admitting: *Deleted

## 2023-09-28 NOTE — Progress Notes (Addendum)
 Complex Care Management Note  Care Guide Note 09/28/2023 Name: Jeffrey [name] MRN: [id number] DOB: [birth year]  Jeffrey [name] is a 89 y.o. year old male who sees Roosvelt Colla, MD for primary care. I reached out to Becky Bowels by phone today to offer complex care management services.  Mr. Finseth was given information about Complex Care Management services today including:   The Complex Care Management services include support from the care team which includes your Nurse Care Manager, Clinical Social Worker, or Pharmacist.  The Complex Care Management team is here to help remove barriers to the health concerns and goals most important to you. Complex Care Management services are voluntary, and the patient may decline or stop services at any time by request to their care team member.   Complex Care Management Consent Status: Patient agreed to services and verbal consent obtained.  Did not schedule with RN or PharmD at this time due to not overwhelming patient with multiple appointments. Routed message to LCSW to schedule as needed after initial assessment.  Follow up plan:  Telephone appointment with complex care management team member scheduled for:  10/07/23  Encounter Outcome:  Patient Scheduled  Barnie Bora  [facility]  [facility], [facility] Guide  Direct Dial: [phone]  Fax [phone]

## 2023-09-30 ENCOUNTER — Other Ambulatory Visit: Payer: Self-pay | Admitting: *Deleted

## 2023-09-30 DIAGNOSIS — R600 Localized edema: Secondary | ICD-10-CM

## 2023-09-30 MED ORDER — FUROSEMIDE 20 MG PO TABS: 20.0000 mg | ORAL_TABLET | Freq: Every day | ORAL | 1 refills | Status: DC

## 2023-10-05 ENCOUNTER — Other Ambulatory Visit: Payer: Self-pay | Admitting: Urology

## 2023-10-05 ENCOUNTER — Other Ambulatory Visit: Payer: Self-pay | Admitting: Cardiovascular Disease

## 2023-10-05 DIAGNOSIS — R351 Nocturia: Secondary | ICD-10-CM

## 2023-10-05 DIAGNOSIS — I480 Paroxysmal atrial fibrillation: Secondary | ICD-10-CM

## 2023-10-06 ENCOUNTER — Ambulatory Visit: Payer: Self-pay | Admitting: Urology

## 2023-10-06 NOTE — Telephone Encounter (Signed)
 Eliquis  2.5mg  refill request received. Patient is 89 years old, weight-55.1kg, Crea-1.53 on 09/26/23, Diagnosis-Afib, and last seen by Dr. Abel Hoe on 08/01/23. Dose is appropriate based on dosing criteria. Will send in refill to requested pharmacy.

## 2023-10-07 ENCOUNTER — Other Ambulatory Visit: Payer: Self-pay | Admitting: Licensed Clinical Social Worker

## 2023-10-11 NOTE — Patient Outreach (Signed)
 Complex Care Management   Visit Note  10/07/2023  Name:  Jeffrey [name] MRN: [id number] DOB: September 13, 1932  Situation: Referral received for Complex Care Management related to Diabetes with Complications and Seizure Disorder I obtained verbal consent from Patient.  Visit completed with pt, spouse, and daughter  on the phone  Background:   Past Medical History:  Diagnosis Date   Acute CVA (cerebrovascular accident) (HCC)    Acute encephalopathy 11/27/2014   Aortic atherosclerosis (HCC) 11/25/2014   Atrial fibrillation (HCC)    a. 12/2018 Zio: Sinus rhythm, avg rate 71. 2 runs of VT (max 17 beats). 27 runs SVT (max 14 beats). Occas PACs/PVCs. No high grade AV block/pauses.   Chronic combined systolic (congestive) and diastolic (congestive) heart failure (HCC)    a. 10/2014 Echo: EF 40-45%; b. 03/2018 Echo: EF 55-60%; c. 12/2018 Echo: EF 55-60%, mild conc LVH. Nl RV fxn. RVSP 51.43mmHg. Mildly dil LA. Mod AS w/ severe Ca2+; d. 12/2019 Echo: EF 50-55%, mild LVH, gr2 DD. Sev elev PASP. Mild MR, mild to mod TR. Triv AI, mod to sev AS (area 0.77cm^2, mean grad . Vmax 2.45m/s).   Diabetes mellitus    Hypercholesteremia    Hypertension    Impaired fasting glucose    Macular degeneration    Moderate to severe aortic stenosis    a. 12/2018 Echo: EF 55-60%, Mod AS w/ sev AoV Ca2+; b. 12/2019 Echo: EF 50-55%. Mod to sev AS (area 0.77cm^2, mean grad . Vmax 2.39m/s).   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 Bridgett Camps)   RMSF [facility] spotted fever) 10/2014   (hosp with FUO, encephalopathy)   Rotator cuff tear, right 10/2010   supraspinatous and infraspinatous   Seizure disorder (HCC) 07/25/2019   TIA (transient ischemic attack) 10/02/13   hosp at [facility] x 1 night   Type 2 diabetes mellitus with microalbuminuria or  microproteinuria 01/15/2014    Assessment: Patient Reported Symptoms:  Cognitive        Neurological      HEENT        Cardiovascular      Respiratory      Endocrine      Gastrointestinal        Genitourinary      Integumentary      Musculoskeletal          Psychosocial              09/26/2023    8:37 AM  Depression screen PHQ 2/9  Decreased Interest 0  Down, Depressed, Hopeless 1  PHQ - 2 Score 1    There were no vitals filed for this visit.  Medications Reviewed Today     Reviewed by Adriana Albany, LCSW (Social Worker) on 10/11/23 at 1614  Med List Status: <None>   Medication Order Taking? Sig Documenting Provider Last Dose Status Informant  acetaminophen  (TYLENOL ) 500 MG tablet [id number] No Take 500 mg by mouth 2 (two) times daily. [provider] Taking Active            Med Note Felipe Horton, Shelagh Derrick Sep 26, 2023  8:28 AM) Kent Pear 2BID  apixaban  (ELIQUIS ) 2.5 MG TABS tablet [id number]  TAKE ONE TABLET (2.5 MG) BY MOUTH TWICE DAILY Wenona Hamilton, MD  Active   atorvastatin  (LIPITOR) 20 MG tablet  [id number] No TAKE 1 TABLET BY MOUTH ONCE DAILY Wenona Hamilton, MD Taking Active   Cyanocobalamin  (VITAMIN B 12 PO) [id number] No Take 1,000 mcg by mouth. [provider] Taking Active   furosemide  (LASIX ) 20 MG tablet [id number]  Take 1 tablet (20 mg total) by mouth daily. Wenona Hamilton, MD  Active   ipratropium (ATROVENT ) 0.03 % nasal spray [id number] No Place 2 sprays into both nostrils 3 (three) times daily as needed for rhinitis.  Patient not taking: Reported on 09/26/2023   Roosvelt Colla, MD Not Taking Active            Med Note Felipe Horton, Venus Ginsberg   Mon Sep 26, 2023  8:29 AM) As needed  Iron, Ferrous Sulfate, 325 (65 Fe) MG TABS [id number] No Take by mouth. [provider] Taking Active   levETIRAcetam  (KEPPRA  XR) 500 MG 24 hr tablet [id number] No TAKE ONE TABLET BY MOUTH ONCE DAILY Sethi, Pramod S, MD Taking Active    loperamide (IMODIUM) 2 MG capsule [id number] No Take 2 mg by mouth as needed for diarrhea or loose stools.  Patient not taking: Reported on 09/26/2023   [provider] Not Taking Active            Med Note Felipe Horton, Venus Ginsberg   Mon Sep 26, 2023  8:29 AM) As needed  loratadine (CLARITIN) 10 MG tablet [id number] No Take by mouth.  Patient not taking: Reported on 09/26/2023   [provider] Not Taking Active            Med Note Felipe Horton, Venus Ginsberg   Mon Sep 26, 2023  8:29 AM) As needed  mirabegron  ER (MYRBETRIQ ) 50 MG TB24 tablet [id number]  TAKE ONE TABLET (50 MG) BY MOUTH EVERY DAY McGowan, Shannon A, PA-C  Active   Multiple Vitamins-Minerals (PRESERVISION AREDS) CAPS [id number] No Take 1 capsule by mouth 2 (two) times daily.  [provider] Taking Active Self  UNABLE TO FIND [id number] No Bevacizumab (AVASTIN ) chemo injection for the eye every 10 weeks [provider] Taking Active Self           Med Note Bevin Bucks, CASEY D   Mon Aug 01, 2023 11:15 AM)              Recommendation:   Continue utilizing strategies discussed to assist with symptom management  Follow Up Plan:   Telephone follow-up in 1 month  Alease Hunter, LCSW [facility]  [facility], [facility] Clinical Social Worker Direct Dial: [phone]  Fax: [phone] Website: [email or web address] 5:38 PM

## 2023-10-11 NOTE — Patient Instructions (Signed)
 Visit Information  Thank you for taking time to visit with me today. Please don't hesitate to contact me if I can be of assistance to you before our next scheduled appointment.  Our next appointment is by telephone on 06/06 at 3 PM Please call the care guide team at [phone] if you need to cancel or reschedule your appointment.   Following is a copy of your care plan:   Goals Addressed             This Visit's Progress    LCSW [facility] Social Work Care Plan   On track    Problems:   Level of Care Concerns:Inability to perform ADL's independently Inability to perform IADL's independently  CSW Clinical Goal(s):   Over the next 60 days the Patient will attend all scheduled medical appointments as evidenced by patient report and care team review of appointment completion in electronic MEDICAL RECORD NUMBER  work with Social Worker to address concerns related to level of care.  Interventions:  Level of Care Concerns in a patient with DMII and Seizure Disorder Current level of care: Home with other family or significant other(s): spouse  Evaluation of patient's unmet needs in current living environment ADL's Assessed needs, level of care concerns, how currently meeting needs and barriers to care  Patient Goals/Self-Care Activities:  Continue taking your medication as prescribed.   Increase coping skills and healthy habits  Plan:   Telephone follow up appointment with care management team member scheduled for:  2-4 weeks        Please call the Suicide and Crisis Lifeline: 988 go to [facility] Urgent [facility] [place] [address] [place] [phone]) call 911 if you are experiencing a Mental Health or Behavioral Health Crisis or need someone to talk to.  Patient verbalizes understanding of instructions and care plan provided today and agrees to view in MyChart. Active MyChart status and patient understanding of how to access instructions and care plan via  MyChart confirmed with patient.     Arlis Bent [facility]  [facility], [facility] Clinical Social Worker Direct Dial: [phone]  Fax: [phone] Website: [email or web address] 6:07 PM

## 2023-10-19 ENCOUNTER — Telehealth: Payer: Self-pay | Admitting: *Deleted

## 2023-10-19 ENCOUNTER — Other Ambulatory Visit: Payer: Self-pay | Admitting: *Deleted

## 2023-10-19 DIAGNOSIS — H9193 Unspecified hearing loss, bilateral: Secondary | ICD-10-CM

## 2023-10-19 NOTE — Telephone Encounter (Signed)
 Copied from CRM [id number]. Topic: Referral - Request for Referral >> Oct 19, 2023  3:18 PM Carlatta H wrote: Did the patient discuss referral with their provider in the last year? Yes (If No - schedule appointment) (If Yes - send message)  Appointment offered? Yes  Type of order/referral and detailed reason for visit: Hearing  Preference of office, provider, location: [facility] in [place]  If referral order, have you been seen by this specialty before? No (If Yes, this issue or another issue? When? Where?  Can we respond through MyChart? No     Per last office note ok for referral, this was encouraged.

## 2023-10-21 ENCOUNTER — Telehealth: Payer: Self-pay | Admitting: Cardiovascular Disease

## 2023-10-21 NOTE — Telephone Encounter (Signed)
 Spoke to patient daughter Sherrlyn Dolores regarding reschedule appt 10/28/23. Refused APP aapt only wants Alvenia Aus states dad is in close to death situation. Please call to reschedule appt ASAP.

## 2023-10-21 NOTE — Telephone Encounter (Signed)
 Appointment moved to 5/27 with Dr. Alvenia Aus. Daughter is aware.

## 2023-10-25 ENCOUNTER — Ambulatory Visit: Admitting: Cardiovascular Disease

## 2023-10-28 ENCOUNTER — Ambulatory Visit: Payer: Medicare Other | Admitting: Cardiovascular Disease

## 2023-11-02 DIAGNOSIS — H903 Sensorineural hearing loss, bilateral: Secondary | ICD-10-CM | POA: Diagnosis not present

## 2023-11-04 ENCOUNTER — Other Ambulatory Visit: Payer: Self-pay | Admitting: Licensed Clinical Social Worker

## 2023-11-15 NOTE — Patient Outreach (Signed)
 Complex Care Management   Visit Note  11/04/2023  Name:  Jeffrey [name] MRN: [id number] DOB: 1932/12/14  Situation: Referral received for Complex Care Management related to Diabetes with Complications and Seizure Disorder I obtained verbal consent from Caregiver.  Visit completed with spouse  on the phone  Background:   Past Medical History:  Diagnosis Date   Acute CVA (cerebrovascular accident) (HCC)    Acute encephalopathy 11/27/2014   Aortic atherosclerosis (HCC) 11/25/2014   Atrial fibrillation (HCC)    a. 12/2018 Zio: Sinus rhythm, avg rate 71. 2 runs of VT (max 17 beats). 27 runs SVT (max 14 beats). Occas PACs/PVCs. No high grade AV block/pauses.   Chronic combined systolic (congestive) and diastolic (congestive) heart failure (HCC)    a. 10/2014 Echo: EF 40-45%; b. 03/2018 Echo: EF 55-60%; c. 12/2018 Echo: EF 55-60%, mild conc LVH. Nl RV fxn. RVSP 51.3mmHg. Mildly dil LA. Mod AS w/ severe Ca2+; d. 12/2019 Echo: EF 50-55%, mild LVH, gr2 DD. Sev elev PASP. Mild MR, mild to mod TR. Triv AI, mod to sev AS (area 0.77cm^2, mean grad . Vmax 2.39m/s).   Diabetes mellitus    Hypercholesteremia    Hypertension    Impaired fasting glucose    Macular degeneration    Moderate to severe aortic stenosis    a. 12/2018 Echo: EF 55-60%, Mod AS w/ sev AoV Ca2+; b. 12/2019 Echo: EF 50-55%. Mod to sev AS (area 0.77cm^2, mean grad . Vmax 2.42m/s).   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 Bridgett Camps)   RMSF [facility] spotted fever) 10/2014   (hosp with FUO, encephalopathy)   Rotator cuff tear, right 10/2010   supraspinatous and infraspinatous   Seizure disorder (HCC) 07/25/2019   TIA (transient ischemic attack) 10/02/13   hosp at [facility] x 1 night   Type 2 diabetes mellitus with microalbuminuria or microproteinuria  01/15/2014    Assessment: Patient Reported Symptoms:  Cognitive Cognitive Status: Alert and oriented to person, place, and time   Health Maintenance Behaviors: None  Neurological Neurological Review of Symptoms: No symptoms reported Neurological Conditions: Seizures, Parkinson's disease Neurological Management Strategies: Medication therapy, Routine screening  HEENT HEENT Symptoms Reported: No symptoms reported      Cardiovascular Cardiovascular Symptoms Reported: No symptoms reported Cardiovascular Conditions: Heart failure, Hypertension Cardiovascular Management Strategies: Routine screening, Medication therapy, Coping strategies  Respiratory Respiratory Symptoms Reported: No symptoms reported Additional Respiratory Details: Sleep Apnea    Endocrine Patient reports the following symptoms related to hypoglycemia or hyperglycemia : No symptoms reported Is patient diabetic?: Yes Endocrine Conditions: Diabetes Endocrine Management Strategies: Routine screening, Medication therapy  Gastrointestinal Gastrointestinal Symptoms Reported: No symptoms reported      Genitourinary Genitourinary Symptoms Reported: No symptoms reported    Integumentary Integumentary Symptoms Reported: No symptoms reported    Musculoskeletal Musculoskelatal Symptoms Reviewed: No symptoms reported Musculoskeletal Conditions: Mobility limited Musculoskeletal Management Strategies: Routine screening      Psychosocial Psychosocial Symptoms Reported: No symptoms reported Additional Psychological Details: Family continues to denies any depression or anxiety symptoms. He has a strong support system and is reported doing well            09/26/2023    8:37 AM  Depression screen PHQ 2/9  Decreased Interest 0  Down, Depressed, Hopeless 1  PHQ - 2 Score 1  There were no vitals filed for this visit.  Medications Reviewed Today     Reviewed by Eulene Pekar D, LCSW (Social Worker) on 11/15/23 at [phone]   Med List Status: <None>   Medication Order Taking? Sig Documenting Provider Last Dose Status Informant  acetaminophen  (TYLENOL ) 500 MG tablet [id number] No Take 500 mg by mouth 2 (two) times daily. [provider] Taking Active            Med Note Felipe Horton, Shelagh Derrick Sep 26, 2023  8:28 AM) Kent Pear 2BID  apixaban  (ELIQUIS ) 2.5 MG TABS tablet [id number]  TAKE ONE TABLET (2.5 MG) BY MOUTH TWICE DAILY Wenona Hamilton, MD  Active   atorvastatin  (LIPITOR) 20 MG tablet [id number] No TAKE 1 TABLET BY MOUTH ONCE DAILY Wenona Hamilton, MD Taking Active   Cyanocobalamin  (VITAMIN B 12 PO) [id number] No Take 1,000 mcg by mouth. [provider] Taking Active   furosemide  (LASIX ) 20 MG tablet [id number]  Take 1 tablet (20 mg total) by mouth daily. Wenona Hamilton, MD  Active   ipratropium (ATROVENT ) 0.03 % nasal spray [id number] No Place 2 sprays into both nostrils 3 (three) times daily as needed for rhinitis.  Patient not taking: Reported on 09/26/2023   Roosvelt Colla, MD Not Taking Active            Med Note Felipe Horton, Venus Ginsberg   Mon Sep 26, 2023  8:29 AM) As needed  Iron, Ferrous Sulfate, 325 (65 Fe) MG TABS [id number] No Take by mouth. [provider] Taking Active   levETIRAcetam  (KEPPRA  XR) 500 MG 24 hr tablet [id number] No TAKE ONE TABLET BY MOUTH ONCE DAILY Sethi, Pramod S, MD Taking Active   loperamide (IMODIUM) 2 MG capsule [id number] No Take 2 mg by mouth as needed for diarrhea or loose stools.  Patient not taking: Reported on 09/26/2023   [provider] Not Taking Active            Med Note Felipe Horton, Venus Ginsberg   Mon Sep 26, 2023  8:29 AM) As needed  loratadine (CLARITIN) 10 MG tablet [id number] No Take by mouth.  Patient not taking: Reported on 09/26/2023   [provider] Not Taking Active            Med Note Felipe Horton, Venus Ginsberg   Mon Sep 26, 2023  8:29 AM) As needed  mirabegron  ER (MYRBETRIQ ) 50 MG TB24 tablet [id number]  TAKE ONE TABLET (50 MG) BY MOUTH  EVERY DAY McGowan, Shannon A, PA-C  Active   Multiple Vitamins-Minerals (PRESERVISION AREDS) CAPS [id number] No Take 1 capsule by mouth 2 (two) times daily.  [provider] Taking Active Self  UNABLE TO FIND [id number] No Bevacizumab (AVASTIN ) chemo injection for the eye every 10 weeks [provider] Taking Active Self           Med Note Bevin Bucks, CASEY D   Mon Aug 01, 2023 11:15 AM)              Recommendation:   Continue Current Plan of Care  Follow Up Plan:   Closing From:  Complex Care Management  Alease Hunter, LCSW [place]  [facility], [facility] Clinical Social Worker Direct Dial: [phone]  Fax: [phone] Website: [email or web address] 8:42 AM

## 2023-11-15 NOTE — Patient Instructions (Signed)
 Visit Information  Thank you for taking time to visit with me today. Please don't hesitate to contact me if I can be of assistance to you before our next scheduled appointment.   Closing From: Complex Care Management.  Please call the care guide team at [phone] if you need to cancel, schedule, or reschedule an appointment.   Please call the Suicide and Crisis Lifeline: 988 call 1-800-273-TALK (toll free, 24 hour hotline) call 911 if you are experiencing a Mental Health or Behavioral Health Crisis or need someone to talk to.  Alease Hunter, LCSW [place]  [facility], [facility] Clinical Social Worker Direct Dial: [phone]  Fax: [phone] Website: [email or web address] 8:43 AM

## 2023-11-29 NOTE — Progress Notes (Unsigned)
 No chief complaint on file.  Knot on forehead   Cancelled appts with Dr. Darron in late May, not r/s.   PMH, PSH, SH reviewed   ROS:    PHYSICAL EXAM:  There were no vitals taken for this visit.  Wt Readings from Last 3 Encounters:  09/26/23 121 lb 6.4 oz (55.1 kg)  08/01/23 130 lb 9.6 oz (59.2 kg)  07/19/23 138 lb (62.6 kg)      ASSESSMENT/PLAN:  If he had a fall, may need orthostatic VS, and labs (CBC, c-met, poss u/a) He needs to r/s f/u visit with Dr. Darron (cancelled visits for late May (3 mos f/u) and never r/s

## 2023-11-30 ENCOUNTER — Encounter: Payer: Self-pay | Admitting: Family Medicine

## 2023-11-30 ENCOUNTER — Ambulatory Visit: Admitting: Family Medicine

## 2023-11-30 VITALS — BP 150/78 | HR 88 | Ht 71.0 in | Wt 122.8 lb

## 2023-11-30 DIAGNOSIS — T148XXA Other injury of unspecified body region, initial encounter: Secondary | ICD-10-CM

## 2023-11-30 DIAGNOSIS — E46 Unspecified protein-calorie malnutrition: Secondary | ICD-10-CM

## 2023-11-30 DIAGNOSIS — G20C Parkinsonism, unspecified: Secondary | ICD-10-CM

## 2023-11-30 DIAGNOSIS — Z7901 Long term (current) use of anticoagulants: Secondary | ICD-10-CM

## 2023-11-30 DIAGNOSIS — I509 Heart failure, unspecified: Secondary | ICD-10-CM | POA: Diagnosis not present

## 2023-11-30 DIAGNOSIS — I38 Endocarditis, valve unspecified: Secondary | ICD-10-CM

## 2023-11-30 DIAGNOSIS — D649 Anemia, unspecified: Secondary | ICD-10-CM | POA: Diagnosis not present

## 2023-11-30 DIAGNOSIS — Z5181 Encounter for therapeutic drug level monitoring: Secondary | ICD-10-CM

## 2023-11-30 NOTE — Patient Instructions (Signed)
 Your dark urine is a sign that you are not drinking enough water (which contributes to the dizziness and hard stools). Please try and drink more water, and less beer. We are checking bloodwork, and will forward the results to Dr. Darron. It may be that we need to back down on your diuretic. I will leave that up to him. Checking daily weights can be a very useful tool to let us  know your hydration status. If you gain a lot of weight quickly, you are likely accumulating fluid.  If lose a lot of weight quickly, you may be dehydrated.  If we have this information, the nurses or your cardiologist can give recommendations for changes to the furosemide  (diuretic) dosing.  The lumps on the face or likely hematomas from the fall. These will likely improve with time.  Using warm compresses might help it resorb a little faster. If it becomes pain, red, swollen, that can indicate an infection of the hematoma, and it will need to be drained. I'm hesitant to drain the fluid/clot now, due to the fact that he is on blood thinners, and it might reaccumulate.

## 2023-12-01 ENCOUNTER — Ambulatory Visit: Payer: Self-pay | Admitting: Family Medicine

## 2023-12-01 DIAGNOSIS — H35372 Puckering of macula, left eye: Secondary | ICD-10-CM | POA: Diagnosis not present

## 2023-12-01 DIAGNOSIS — H35361 Drusen (degenerative) of macula, right eye: Secondary | ICD-10-CM | POA: Diagnosis not present

## 2023-12-01 DIAGNOSIS — Z961 Presence of intraocular lens: Secondary | ICD-10-CM | POA: Diagnosis not present

## 2023-12-01 DIAGNOSIS — H353231 Exudative age-related macular degeneration, bilateral, with active choroidal neovascularization: Secondary | ICD-10-CM | POA: Diagnosis not present

## 2023-12-01 LAB — COMPREHENSIVE METABOLIC PANEL WITH GFR
ALT: 8 IU/L (ref 0–44)
AST: 19 IU/L (ref 0–40)
Albumin: 4.5 g/dL (ref 3.7–4.7)
Alkaline Phosphatase: 55 IU/L (ref 44–121)
BUN/Creatinine Ratio: 27 — AB (ref 10–24)
BUN: 34 mg/dL — AB (ref 8–27)
Bilirubin Total: 0.4 mg/dL (ref 0.0–1.2)
CO2: 23 mmol/L (ref 20–29)
Calcium: 9.7 mg/dL (ref 8.6–10.2)
Chloride: 104 mmol/L (ref 96–106)
Creatinine, Ser: 1.27 mg/dL (ref 0.76–1.27)
Globulin, Total: 2.4 g/dL (ref 1.5–4.5)
Glucose: 98 mg/dL (ref 70–99)
Potassium: 5.4 mmol/L — AB (ref 3.5–5.2)
Sodium: 143 mmol/L (ref 134–144)
Total Protein: 6.9 g/dL (ref 6.0–8.5)
eGFR: 54 mL/min/{1.73_m2} — AB (ref >59)

## 2023-12-01 LAB — CBC WITH DIFFERENTIAL/PLATELET
Basophils Absolute: 0 10*3/uL (ref 0.0–0.2)
Basos: 1 %
EOS (ABSOLUTE): 0 10*3/uL (ref 0.0–0.4)
Eos: 0 %
Hematocrit: 35.5 % — ABNORMAL LOW (ref 37.5–51.0)
Hemoglobin: 11.2 g/dL — ABNORMAL LOW (ref 13.0–17.7)
Immature Grans (Abs): 0 10*3/uL (ref 0.0–0.1)
Immature Granulocytes: 0 %
Lymphocytes Absolute: 1.3 10*3/uL (ref 0.7–3.1)
Lymphs: 18 %
MCH: 33.1 pg — ABNORMAL HIGH (ref 26.6–33.0)
MCHC: 31.5 g/dL (ref 31.5–35.7)
MCV: 105 fL — ABNORMAL HIGH (ref 79–97)
Monocytes Absolute: 0.7 10*3/uL (ref 0.1–0.9)
Monocytes: 10 %
Neutrophils Absolute: 5 10*3/uL (ref 1.4–7.0)
Neutrophils: 71 %
Platelets: 292 10*3/uL (ref 150–450)
RBC: 3.38 x10E6/uL — ABNORMAL LOW (ref 4.14–5.80)
RDW: 13 % (ref 11.6–15.4)
WBC: 7.1 10*3/uL (ref 3.4–10.8)

## 2023-12-01 NOTE — Progress Notes (Signed)
 Dr. Leitha saw our mutual patient yesterday.  He had no edema, not checking weights, endorses mild orthostatic symptoms but was NOT orthostatic at the visit.  I reached out to RN from [facility] for CCM to assist with CHF measures and other high risk issues with him (falls).  He had cancelled his visits with you the end of May and hasn't rescheduled.  I asked them to r/s visit with you.  Diuretics likely can be adjusted down further. I don't see any potassium supplement listed in his meds.  FYI.

## 2023-12-06 ENCOUNTER — Telehealth: Payer: Self-pay | Admitting: Urology

## 2023-12-06 ENCOUNTER — Other Ambulatory Visit: Payer: Self-pay | Admitting: Urology

## 2023-12-06 ENCOUNTER — Telehealth: Payer: Self-pay | Admitting: Cardiovascular Disease

## 2023-12-06 DIAGNOSIS — R351 Nocturia: Secondary | ICD-10-CM

## 2023-12-06 NOTE — Telephone Encounter (Signed)
 New Message:     Patient's daughter called and said patient saw his primary doctor, Dr Elyn Fetters. She said Dr Fetters wants the patient to see Dr Darron asap. She said patient have her a couple of falls. Dr Fetters was very concerned about this. . I told patient's daughter that Dr Darron did not have any available appointments until October. She said that was not acceptable.I offered  her an appointment with one of the APP's. She said they did not want to see them, only Dr Darron.

## 2023-12-06 NOTE — Telephone Encounter (Signed)
 Pt's daughter, Randine Cools, called office asking if pt can do a virtual visit, instead of coming in.  He's been falling a lot.  He will run out of Myrbetriq  50 mg tomorrow.  He uses [facility] in [place].  Let me know if it's O.K. to schedule a virtual visit.

## 2023-12-06 NOTE — Telephone Encounter (Signed)
 Spoke with pt's daughter, Keller, who states that her father missed his appointment in May, and his PCP suggested that he see Dr. Darron as soon as possible.  Pt has had 2 falls while getting out of bed to go to bathroom due to dizziness. Offered for pt to see one of our APPs, but daughter states that she only wants to see Dr. Darron.   I was able to obtain an appointment on August 13 @ 1040a with Dr. Darron.  Daughter verbalized her appreciation for the appointment.

## 2023-12-07 ENCOUNTER — Other Ambulatory Visit: Payer: Self-pay | Admitting: Urology

## 2023-12-07 DIAGNOSIS — R351 Nocturia: Secondary | ICD-10-CM

## 2023-12-07 MED ORDER — MIRABEGRON ER 50 MG PO TB24: 50.0000 mg | ORAL_TABLET | Freq: Every day | ORAL | 1 refills | Status: DC

## 2023-12-29 ENCOUNTER — Encounter: Payer: Self-pay | Admitting: *Deleted

## 2023-12-30 ENCOUNTER — Other Ambulatory Visit: Payer: Self-pay | Admitting: Cardiovascular Disease

## 2023-12-30 DIAGNOSIS — R600 Localized edema: Secondary | ICD-10-CM

## 2024-01-01 NOTE — Progress Notes (Unsigned)
 No chief complaint on file.  Last seen here 7/2 with facial hematomas related to a fall on 6/8.  I reached out to [facility] RN, but do not see that they connected ***   Patient has afib, pulmonary HTN and CHF, under the care of Dr. Darron. Last appt was in 07/2023. Lasix  dose was increased to 40 mg at that time, subsequently decreased back to 20 mg daily (getting packaged by the pharmacy).   He has a f/u appointment with Dr. Darron on 8/13. They are not checking daily weights. At his last visit he reported dark/rust-colored urine, and was felt to not be well hydrated, confirmed by BUN of 34, Cr 1.27, elevated BUN/Cr ratio. He was encouraged to drink more water.  Today he reports ***  IFG--He doesn't check his sugars.  Last A1c was 5.9 in 08/2023 (5.8% in 03/2023,  6.1% in 08/2022). He continues to try and limit bread, candy and cookies.  He has some sweets if it is in the house.   He is drinking 3-4 ***  glasses of water daily.  He continues to a few glasses/day of decaff sweet tea.  *** He developed the taste back for beer, having 1 Heineken/day. ***  Lab Results  Component Value Date   HGBA1C 5.9 (A) 09/26/2023      Hyperlipidemia:  He is compliant with atorvastatin  20 mg daily and denies side effects (switched from lovastatin  to atorvastatin  by cardiologist in 05/2021).  Tries to follow a lowfat, low cholesterol diet. Lipids were at goal on last check.  Lab Results  Component Value Date   CHOL 142 09/26/2023   HDL 61 09/26/2023   LDLCALC 66 09/26/2023   TRIG 76 09/26/2023   CHOLHDL 2.3 09/26/2023     H/o Stroke (10/2014) and seizures. 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 He is under the care of Dr. Rosemarie. He had a barium swallow study 11/22/2022 which found moderate oropharyngeal dysphagia with silent aspiration of thin liquids noted. Recommendation was for dysphagia 2 diet and mildly thick  liquids with full supervision. They do not thicken his fluids, and he sometimes has some coughing with eating and drinking.   Parkinson's--he saw Dr. Buck in 06/2023 for 2nd opinion on Parkinson's.  They elected not to re-try Sinimet. He uses his walker at home, cane when out because it is easier.   He remains on Eliquis  for anticoagulation related to afib for prevention of further strokes.  He denies any bleeding, bruising (just related to falls) He has had B12 deficiency, and is compliant with taking a supplement daily. Last level was normal in 04/2022.   Prostate cancer and nocturia:  He is under care of [facility].  PSAs have been undetectable (last checked 09/2022).  Next appt is 02/08/24. They prescribe Myrbetriq  50mg .  Nocturia is less on this medication--he is up 3-4 times/night (improved from 5-6 in the past).    PMH, PSH, SH reviewed   ROS: No f/c, HA or dizziness. Denies chest pain, palpitations. No URI symptoms. No bleeding bruising. +urinary frequency per usual. No bowel changes, nausea or vomiting.  Generalized weakness   Edema? SOB?     PHYSICAL EXAM:  There were no vitals taken for this visit.  Wt Readings from Last 3 Encounters:  11/30/23 122 lb 12.8 oz (55.7 kg)  09/26/23 121 lb 6.4 oz (55.1 kg)  08/01/23 130 lb 9.6 oz (59.2 kg)    Elderly,  frail male, in no distress. He appears to be comfortable at rest. He is speaking comfortably. HEENT: conjunctiva and sclera are clear, EOMI. Neck: no lymphadenopathy or mass Heart: regular rate. Irregularly irregular rhythm, with loud murmur, unchanged. Lungs: *** Abdomen: soft, nontender Extremities: *** Skin: intact, no visible rash Psych: normal  mood, affect.  Unshaven. *** Normal hygiene.  1 month ago--soft tissue swelling (suspected hematoma) Above L eyebrow--2.5 -3 x 2 cm.  Soft, nontender L cheek 2.5 x 2-2.5 cm.  Slightly firmer, nontender  ***UPDATE ALL  EXAM   ASSESSMENT/PLAN:   B-met (Does NOT need A1c, fine 3 mos ago)

## 2024-01-02 ENCOUNTER — Ambulatory Visit (INDEPENDENT_AMBULATORY_CARE_PROVIDER_SITE_OTHER): Admitting: Family Medicine

## 2024-01-02 ENCOUNTER — Encounter: Payer: Self-pay | Admitting: Family Medicine

## 2024-01-02 VITALS — BP 138/84 | HR 84 | Ht 71.0 in | Wt 122.0 lb

## 2024-01-02 DIAGNOSIS — G20C Parkinsonism, unspecified: Secondary | ICD-10-CM

## 2024-01-02 DIAGNOSIS — I480 Paroxysmal atrial fibrillation: Secondary | ICD-10-CM

## 2024-01-02 DIAGNOSIS — I509 Heart failure, unspecified: Secondary | ICD-10-CM | POA: Diagnosis not present

## 2024-01-02 DIAGNOSIS — E46 Unspecified protein-calorie malnutrition: Secondary | ICD-10-CM

## 2024-01-02 DIAGNOSIS — Z5181 Encounter for therapeutic drug level monitoring: Secondary | ICD-10-CM | POA: Diagnosis not present

## 2024-01-02 DIAGNOSIS — I10 Essential (primary) hypertension: Secondary | ICD-10-CM | POA: Diagnosis not present

## 2024-01-02 DIAGNOSIS — E1129 Type 2 diabetes mellitus with other diabetic kidney complication: Secondary | ICD-10-CM | POA: Diagnosis not present

## 2024-01-02 DIAGNOSIS — I38 Endocarditis, valve unspecified: Secondary | ICD-10-CM

## 2024-01-02 DIAGNOSIS — E7800 Pure hypercholesterolemia, unspecified: Secondary | ICD-10-CM

## 2024-01-02 DIAGNOSIS — R809 Proteinuria, unspecified: Secondary | ICD-10-CM | POA: Diagnosis not present

## 2024-01-02 DIAGNOSIS — D649 Anemia, unspecified: Secondary | ICD-10-CM

## 2024-01-02 NOTE — Patient Instructions (Addendum)
 Please try and limit the sweet tea. Continue to have Ensure multiple times/day. Try and increase your protein intake with each meal (egg, fish, chicken, beans, chick peas, etc)  We will contact you with the lab results, and let you know if any changes should be made (specifically regarding your diuretic dose, furosemide ). Keep your appointment with Dr. Darron as scheduled  You may use the claritin daily, if needed. You can also use the ipratropium (atrovent ) nasal spray as needed for runny nose.  Try and do the exercises that we discussed today--squats (getting into and out of chair), and some arm exercises (using weight, or pushing yourself up from the seat of a chair).  If it will be hard to get here for your next visit in December, contact us  prior to the visit to see if it is necessary, or if we can do a virtual visit

## 2024-01-03 ENCOUNTER — Ambulatory Visit: Payer: Self-pay | Admitting: Family Medicine

## 2024-01-03 LAB — BASIC METABOLIC PANEL WITH GFR
BUN/Creatinine Ratio: 32 — ABNORMAL HIGH (ref 10–24)
BUN: 43 mg/dL — ABNORMAL HIGH (ref 8–27)
CO2: 21 mmol/L (ref 20–29)
Calcium: 9.8 mg/dL (ref 8.6–10.2)
Chloride: 102 mmol/L (ref 96–106)
Creatinine, Ser: 1.35 mg/dL — ABNORMAL HIGH (ref 0.76–1.27)
Glucose: 96 mg/dL (ref 70–99)
Potassium: 5.4 mmol/L — ABNORMAL HIGH (ref 3.5–5.2)
Sodium: 141 mmol/L (ref 134–144)
eGFR: 50 mL/min/1.73 — ABNORMAL LOW (ref >59)

## 2024-01-05 ENCOUNTER — Telehealth: Payer: Self-pay

## 2024-01-05 NOTE — Progress Notes (Signed)
 Complex Care Management Note Care Guide Note  01/05/2024 Name: Jeffrey [name] MRN: [id number] DOB: 07/17/1932   Complex Care Management Outreach Attempts: An unsuccessful telephone outreach was attempted today to offer the patient information about available complex care management services.  Follow Up Plan:  No further outreach attempts will be made at this time. We have been unable to contact the patient to offer or enroll patient in complex care management services.  Encounter Outcome:  Patient Refused  Leotis Rase [facility], [facility] Guide  Direct Dial: [phone]  Fax [phone]

## 2024-01-11 ENCOUNTER — Encounter: Payer: Self-pay | Admitting: Cardiovascular Disease

## 2024-01-11 ENCOUNTER — Ambulatory Visit: Attending: Cardiovascular Disease | Admitting: Cardiovascular Disease

## 2024-01-11 VITALS — BP 128/72 | HR 82 | Ht 71.0 in | Wt 123.2 lb

## 2024-01-11 DIAGNOSIS — I4819 Other persistent atrial fibrillation: Secondary | ICD-10-CM | POA: Diagnosis not present

## 2024-01-11 DIAGNOSIS — I5022 Chronic systolic (congestive) heart failure: Secondary | ICD-10-CM | POA: Insufficient documentation

## 2024-01-11 DIAGNOSIS — R600 Localized edema: Secondary | ICD-10-CM | POA: Insufficient documentation

## 2024-01-11 DIAGNOSIS — I350 Nonrheumatic aortic (valve) stenosis: Secondary | ICD-10-CM | POA: Diagnosis not present

## 2024-01-11 DIAGNOSIS — E785 Hyperlipidemia, unspecified: Secondary | ICD-10-CM | POA: Insufficient documentation

## 2024-01-11 MED ORDER — FUROSEMIDE 20 MG PO TABS: 20.0000 mg | ORAL_TABLET | ORAL | 3 refills | Status: DC

## 2024-01-11 NOTE — Patient Instructions (Signed)
 Medication Instructions:  Your physician recommends the following medication changes.   START TAKING: Lasix  20 mg by mouth every other day     *If you need a refill on your cardiac medications before your next appointment, please call your pharmacy*  Lab Work: No labs ordered today    Testing/Procedures: No test ordered today   Follow-Up: At [facility], you and your health needs are our priority.  As part of our continuing mission to provide you with exceptional heart care, our providers are all part of one team.  This team includes your primary Cardiologist (physician) and Advanced Practice Providers or APPs (Physician Assistants and Nurse Practitioners) who all work together to provide you with the care you need, when you need it.  Your next appointment:   3 month(s)  Provider:   You may see Deatrice Cage, MD or one of the following Advanced Practice Providers on your designated Care Team:   Lonni Meager, NP Lesley Maffucci, PA-C Bernardino Bring, PA-C Cadence Milan, PA-C Tylene Lunch, NP Barnie Hila, NP

## 2024-01-11 NOTE — Progress Notes (Signed)
 Cardiology Office Note   Date:  01/11/2024   ID:  [name], Jeffrey 02-27-1933, MRN [id number]  PCP:  Randol Dawes, MD  Cardiologist:   Deatrice Cage, MD   Chief Complaint  Patient presents with   Follow-up    Patient complains of dizzy spells in the past weeks when standing.      History of Present Illness: Jeffrey [name] is a 89 y.o. male who presents for a follow-up visit regarding persistent atrial fibrillation, chronic systolic heart failure and aortic stenosis. Other medical problems include hyperlipidemia, TIA,  moderate left carotid stenosis, sleep apnea and borderline diabetes mellitus. He had previous stroke in June 2016 and later was found to have [facility] Fever which was treated with doxycycline .    Nuclear stress test in August of 2016 showed no evidence of ischemia with normal ejection fraction.  He had significant weight loss in 2023 due to poor appetite overall.  He had an emergency room visit in October for weakness and was found to have mild volume depletion.  Furosemide  was discontinued in February 2024.  He had recurrent falls over the last 2 years.  He had about 4-5 falls last year alone.  In addition, he had slight memory decline but the family reports stability over the last year.  He was seen by neurology and is suspected of having Parkinson's.  He was seen in early 2025 for worsening shortness of breath and lower extremity edema.  His BNP was greater than 2000.  Small dose of furosemide  was resumed.  He had an echocardiogram done in March of this year which showed an EF of 30 to 35%, mild to moderate mitral regurgitation and severe aortic stenosis with mean gradient of 26 mmHg and valve area of 0.37 cm.  He was seen by Dr. Verlin and we both felt that he is a poor candidate for TAVR.  He had recent labs done which showed slight elevation of creatinine to 1.35.  Thus, the dose of furosemide  was changed to 20 mg every other day. The  patient complains of orthostatic dizziness but no syncope or presyncope.  He denies any chest pain or shortness of breath.  His memory seems to be declining.   Past Medical History:  Diagnosis Date   Acute CVA (cerebrovascular accident) (HCC)    Acute encephalopathy 11/27/2014   Aortic atherosclerosis (HCC) 11/25/2014   Atrial fibrillation (HCC)    a. 12/2018 Zio: Sinus rhythm, avg rate 71. 2 runs of VT (max 17 beats). 27 runs SVT (max 14 beats). Occas PACs/PVCs. No high grade AV block/pauses.   Chronic combined systolic (congestive) and diastolic (congestive) heart failure (HCC)    a. 10/2014 Echo: EF 40-45%; b. 03/2018 Echo: EF 55-60%; c. 12/2018 Echo: EF 55-60%, mild conc LVH. Nl RV fxn. RVSP 51.69mmHg. Mildly dil LA. Mod AS w/ severe Ca2+; d. 12/2019 Echo: EF 50-55%, mild LVH, gr2 DD. Sev elev PASP. Mild MR, mild to mod TR. Triv AI, mod to sev AS (area 0.77cm^2, mean grad . Vmax 2.23m/s).   Diabetes mellitus    Hypercholesteremia    Hypertension    Impaired fasting glucose    Macular degeneration    Moderate to severe aortic stenosis    a. 12/2018 Echo: EF 55-60%, Mod AS w/ sev AoV Ca2+; b. 12/2019 Echo: EF 50-55%. Mod to sev AS (area 0.77cm^2, mean grad . Vmax 2.6m/s).   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 Albertus)   RMSF [facility] spotted fever) 10/2014   (hosp with FUO, encephalopathy)   Rotator cuff tear, right 10/2010   supraspinatous and infraspinatous   Seizure disorder (HCC) 07/25/2019   TIA (transient ischemic attack) 10/02/13   hosp at [facility] x 1 night   Type 2 diabetes mellitus with microalbuminuria or microproteinuria 01/15/2014    Past Surgical History:  Procedure Laterality Date   CATARACT EXTRACTION Right 01/2017   CIRCUMCISION  01/2017   Dr. Nicholaus   COLONOSCOPY N/A 11/28/2012    Procedure: COLONOSCOPY;  Surgeon: Gordy CHRISTELLA Albertus, MD;  Location: [facility] ENDOSCOPY;  Service: Gastroenterology;  Laterality: N/A;   INTERNAL URETHROTOMY  01/27/2016   [facility]   prostate seed implant     ROTATOR CUFF REPAIR  06/16/2011   right (Dr. Yvone)   TONSILLECTOMY AND ADENOIDECTOMY  age 26     Current Outpatient Medications  Medication Sig Dispense Refill   acetaminophen  (TYLENOL ) 500 MG tablet Take 500 mg by mouth 2 (two) times daily.     apixaban  (ELIQUIS ) 2.5 MG TABS tablet TAKE ONE TABLET (2.5 MG) BY MOUTH TWICE DAILY 60 tablet 5   atorvastatin  (LIPITOR) 20 MG tablet TAKE 1 TABLET BY MOUTH ONCE DAILY 90 tablet 3   Cyanocobalamin  (VITAMIN B 12 PO) Take 1,000 mcg by mouth.     furosemide  (LASIX ) 20 MG tablet Take 1 tablet (20 mg total) by mouth daily. (Patient taking differently: Take 20 mg by mouth every other day.) 90 tablet 1   ipratropium (ATROVENT ) 0.03 % nasal spray Place 2 sprays into both nostrils 3 (three) times daily as needed for rhinitis. 30 mL 12   Iron, Ferrous Sulfate, 325 (65 Fe) MG TABS Take by mouth.     levETIRAcetam  (KEPPRA  XR) 500 MG 24 hr tablet TAKE ONE TABLET BY MOUTH ONCE DAILY 30 tablet 5   mirabegron  ER (MYRBETRIQ ) 50 MG TB24 tablet Take 1 tablet (50 mg total) by mouth daily. 30 tablet 1   Multiple Vitamins-Minerals (PRESERVISION AREDS) CAPS Take 1 capsule by mouth 2 (two) times daily.      UNABLE TO FIND Bevacizumab (AVASTIN ) chemo injection for the eye every 10 weeks     loperamide (IMODIUM) 2 MG capsule Take 2 mg by mouth as needed for diarrhea or loose stools. (Patient not taking: Reported on 01/11/2024)     loratadine (CLARITIN) 10 MG tablet Take by mouth. (Patient not taking: Reported on 01/11/2024)     No current facility-administered medications for this visit.    Allergies:   Patient has no known allergies.    Social History:  The patient  reports that he quit smoking about 43 years ago. His smoking use included cigarettes. He started smoking about 63 years  ago. He has a 20 pack-year smoking history. He has never used smokeless tobacco. He reports current alcohol use of about 3.0 standard drinks of alcohol per week. He reports that he does not use drugs.   Family History:  The patient's family history includes Cancer in his daughter; Congestive Heart Failure in his grandchild; Dementia in his mother; Diabetes in his daughter and mother; Heart disease in his mother and son; Heart disease (age of onset: 38) in his father; Hypertension in his mother; Stroke in his sister.    ROS:  Please see the history of present illness.   Otherwise, review of systems are  positive for none.   All other systems are reviewed and negative.    PHYSICAL EXAM: VS:  BP 128/72 (BP Location: Left Arm, Patient Position: Sitting, Cuff Size: Normal)   Pulse 82   Ht 5' 11 (1.803 m)   Wt 123 lb 3.2 oz (55.9 kg)   SpO2 98%   BMI 17.18 kg/m  , BMI Body mass index is 17.18 kg/m. GEN: Well nourished, well developed, in no acute distress  HEENT: normal  Neck: Mild JVD.  Faint bilateral carotid bruits Cardiac: Irregularly irregular; no rubs, or gallops,no edema . There is a 3/6 crescendo decrescendo murmur in the aortic area which is late peaking with  diminished S2.  Mild bilateral leg edema. Respiratory:  clear to auscultation bilaterally, normal work of breathing GI: soft, nontender, nondistended, + BS MS: no deformity or atrophy  Skin: warm and dry, no rash  Neuro:  Strength and sensation are intact Psych: euthymic mood, full affect   EKG:  EKG is ordered today. The ekg ordered today demonstrates : Sinus rhythm with Premature supraventricular complexes Right bundle branch block When compared with ECG of 19-Jul-2023 14:34, Sinus rhythm has replaced Atrial fibrillation Criteria for Septal infarct are no longer Present Nonspecific T wave abnormality, improved in Inferior leads Nonspecific T wave abnormality no longer evident in Anterior leads       Recent  Labs: 07/14/2023: BNP 1,959.9 11/30/2023: ALT 8; Hemoglobin 11.2; Platelets 292 01/02/2024: BUN 43; Creatinine, Ser 1.35; Potassium 5.4; Sodium 141    Lipid Panel    Component Value Date/Time   CHOL 142 09/26/2023 1002   CHOL 184 10/01/2013 1719   TRIG 76 09/26/2023 1002   TRIG 400 (H) 10/01/2013 1719   HDL 61 09/26/2023 1002   HDL 55 10/01/2013 1719   CHOLHDL 2.3 09/26/2023 1002   CHOLHDL 2.3 05/27/2017 1007   VLDL 28 06/02/2016 0714   VLDL 80 (H) 10/01/2013 1719   LDLCALC 66 09/26/2023 1002   LDLCALC 78 05/27/2017 1007   LDLCALC 49 10/01/2013 1719      Wt Readings from Last 3 Encounters:  01/11/24 123 lb 3.2 oz (55.9 kg)  01/02/24 122 lb (55.3 kg)  11/30/23 122 lb 12.8 oz (55.7 kg)         ASSESSMENT AND PLAN:  1.  Chronic systolic heart failure: EF of 30 to 35% likely due to underlying aortic stenosis.  He appears to be euvolemic on furosemide  20 mg every other day.  His blood pressure is low with orthostatic hypotension and thus not able to use heart failure medications.  2.  Persistent atrial fibrillation: His EKG today shows sinus rhythm with PACs.  Continue anticoagulation with Eliquis  2.5 mg twice daily.  The dose was decreased from 5 mg given that his weight is now below 60 kg.  3.  Aortic valve stenosis: Severe.  Not a candidate for TAVR.  4. Hyperlipidemia: Most recent lipid profile showed an LDL of 59.  Continue treatment with atorvastatin .  5. Left carotid stenosis: Most recent carotid Doppler in January 2023 showed stable 60 to 79% stenosis on the left side.  No plans to repeat carotid Doppler unless he is symptomatic.     Disposition:   FU in 3 months.  Signed,  Deatrice Cage, MD  01/11/2024 11:01 AM    [facility]

## 2024-01-17 ENCOUNTER — Telehealth: Payer: Self-pay | Admitting: *Deleted

## 2024-01-17 NOTE — Telephone Encounter (Signed)
 Replied via MyChart message

## 2024-01-17 NOTE — Telephone Encounter (Signed)
 Copied from CRM [id number]. Topic: Clinical - Medical Advice >> Jan 17, 2024  1:15 PM Selinda RAMAN wrote: Reason for CRM: Jeffrey [name] the daughter called in looking for some medical advice. She states her father the patient is constipated and very uncomfortable and needs relief. She would like a call back from [place] as Traci left a my chart message and has declined the patient being triaged by a nurse stating she knows [place] and would just like to speak with her about this. Please assist patient further.   I sent the MyChart message that was sent to Dr. Randol. I called Traci. He is not eating a balacned diet, mostly Ensure-3 to 4 a day. Still having difficulty swallowing so this makes him not want to eat.Has not tried anything such as Miralax , stool softener or fiber.

## 2024-02-02 ENCOUNTER — Other Ambulatory Visit: Payer: Self-pay | Admitting: Urology

## 2024-02-02 DIAGNOSIS — R351 Nocturia: Secondary | ICD-10-CM

## 2024-02-07 NOTE — Progress Notes (Unsigned)
 Virtual Visit via Video Note  I connected with Jeffrey [name] on 02/07/24 at 10:40 AM EDT by a video enabled telemedicine application and verified that I am speaking with the correct person using two identifiers.  Location: Patient: Home  Provider: Office    I discussed the limitations of evaluation and management by telemedicine and the availability of in person appointments. The patient expressed understanding and agreed to proceed.  History of Present Illness: Urological history 1. Prostate cancer -PSA (09/2022) undetectable -Completed IMRT + low-dose brachytherapy for Gleason 3+4 prostate cancer March 2011   2.  Urethral stricture -s/p internal urethrotomy and circumcision in September 2018   3. LU TS -contributing factors of age, prostate cancer, pelvic radiation, OSA, HTN, CHF, stroke, diuretics and diabetes -Myrbetriq  50 mg daily   Observations/Objective:  He states that w/o Myrbetriq , he is getting up 4 ot 5 times nightly.   With the Myrbetriq , he is getting up 3 times nightly.  Patient denies any modifying or aggravating factors.  Patient denies any recent UTI's, gross hematuria, dysuria or suprapubic/flank pain.  Patient denies any fevers, chills, nausea or vomiting.    Serum creatinine (12/2023) 1.35, eGFR 50  Hemoglobin A1c (08/2023) 5.9    Assessment and Plan:  1. Nocturia - refill Mybetriq 50 mg daily  Follow Up Instructions:    I discussed the assessment and treatment plan with the patient. The patient was provided an opportunity to ask questions and all were answered. The patient agreed with the plan and demonstrated an understanding of the instructions.   The patient was advised to call back or seek an in-person evaluation if the symptoms worsen or if the condition fails to improve as anticipated.  I provided 15 minutes of non-face-to-face time during this encounter.   Zeniyah Peaster, PA-C

## 2024-02-08 ENCOUNTER — Encounter: Payer: Self-pay | Admitting: Urology

## 2024-02-08 ENCOUNTER — Telehealth (INDEPENDENT_AMBULATORY_CARE_PROVIDER_SITE_OTHER): Admitting: Urology

## 2024-02-08 DIAGNOSIS — C61 Malignant neoplasm of prostate: Secondary | ICD-10-CM | POA: Diagnosis not present

## 2024-02-08 DIAGNOSIS — R351 Nocturia: Secondary | ICD-10-CM

## 2024-02-08 MED ORDER — MIRABEGRON ER 50 MG PO TB24: 50.0000 mg | ORAL_TABLET | Freq: Every day | ORAL | 3 refills | Status: DC

## 2024-02-08 NOTE — Progress Notes (Signed)
 This service is provided via telemedicine   No vital signs collected/recorded due to the encounter was a telemedicine visit.     Patient consents to a telephone visit: Yes    Names of all persons participating in the telemedicine service and their role in the encounter: Clotilda Cornwall, PA

## 2024-02-20 ENCOUNTER — Observation Stay
Admission: EM | Admit: 2024-02-20 | Discharge: 2024-02-21 | Disposition: A | Discharge status: Home / Self Care | Attending: Internal Medicine | Admitting: Internal Medicine

## 2024-02-20 ENCOUNTER — Emergency Department

## 2024-02-20 DIAGNOSIS — I429 Cardiomyopathy, unspecified: Secondary | ICD-10-CM

## 2024-02-20 DIAGNOSIS — R4182 Altered mental status, unspecified: Principal | ICD-10-CM

## 2024-02-20 DIAGNOSIS — I639 Cerebral infarction, unspecified: Secondary | ICD-10-CM | POA: Diagnosis not present

## 2024-02-20 DIAGNOSIS — Z7901 Long term (current) use of anticoagulants: Secondary | ICD-10-CM | POA: Insufficient documentation

## 2024-02-20 DIAGNOSIS — R531 Weakness: Secondary | ICD-10-CM | POA: Diagnosis not present

## 2024-02-20 DIAGNOSIS — I480 Paroxysmal atrial fibrillation: Secondary | ICD-10-CM | POA: Diagnosis not present

## 2024-02-20 DIAGNOSIS — R351 Nocturia: Secondary | ICD-10-CM | POA: Diagnosis not present

## 2024-02-20 DIAGNOSIS — I110 Hypertensive heart disease with heart failure: Secondary | ICD-10-CM | POA: Insufficient documentation

## 2024-02-20 DIAGNOSIS — E1129 Type 2 diabetes mellitus with other diabetic kidney complication: Secondary | ICD-10-CM | POA: Diagnosis not present

## 2024-02-20 DIAGNOSIS — Z1152 Encounter for screening for COVID-19: Secondary | ICD-10-CM | POA: Insufficient documentation

## 2024-02-20 DIAGNOSIS — G319 Degenerative disease of nervous system, unspecified: Secondary | ICD-10-CM | POA: Diagnosis not present

## 2024-02-20 DIAGNOSIS — I6782 Cerebral ischemia: Secondary | ICD-10-CM | POA: Diagnosis not present

## 2024-02-20 DIAGNOSIS — I5022 Chronic systolic (congestive) heart failure: Secondary | ICD-10-CM | POA: Insufficient documentation

## 2024-02-20 DIAGNOSIS — I350 Nonrheumatic aortic (valve) stenosis: Secondary | ICD-10-CM | POA: Diagnosis not present

## 2024-02-20 DIAGNOSIS — G20C Parkinsonism, unspecified: Secondary | ICD-10-CM | POA: Insufficient documentation

## 2024-02-20 DIAGNOSIS — Z79899 Other long term (current) drug therapy: Secondary | ICD-10-CM | POA: Insufficient documentation

## 2024-02-20 DIAGNOSIS — R42 Dizziness and giddiness: Principal | ICD-10-CM

## 2024-02-20 DIAGNOSIS — N3000 Acute cystitis without hematuria: Secondary | ICD-10-CM

## 2024-02-20 DIAGNOSIS — R109 Unspecified abdominal pain: Secondary | ICD-10-CM | POA: Diagnosis not present

## 2024-02-20 DIAGNOSIS — J168 Pneumonia due to other specified infectious organisms: Secondary | ICD-10-CM | POA: Diagnosis not present

## 2024-02-20 DIAGNOSIS — Z87891 Personal history of nicotine dependence: Secondary | ICD-10-CM | POA: Diagnosis not present

## 2024-02-20 DIAGNOSIS — Z515 Encounter for palliative care: Secondary | ICD-10-CM | POA: Diagnosis not present

## 2024-02-20 DIAGNOSIS — R5381 Other malaise: Secondary | ICD-10-CM

## 2024-02-20 DIAGNOSIS — G40409 Other generalized epilepsy and epileptic syndromes, not intractable, without status epilepticus: Secondary | ICD-10-CM | POA: Insufficient documentation

## 2024-02-20 DIAGNOSIS — R9082 White matter disease, unspecified: Secondary | ICD-10-CM | POA: Diagnosis not present

## 2024-02-20 DIAGNOSIS — I672 Cerebral atherosclerosis: Secondary | ICD-10-CM | POA: Diagnosis not present

## 2024-02-20 DIAGNOSIS — I4581 Long QT syndrome: Secondary | ICD-10-CM | POA: Diagnosis not present

## 2024-02-20 DIAGNOSIS — R270 Ataxia, unspecified: Secondary | ICD-10-CM | POA: Insufficient documentation

## 2024-02-20 DIAGNOSIS — I5020 Unspecified systolic (congestive) heart failure: Secondary | ICD-10-CM

## 2024-02-20 DIAGNOSIS — N390 Urinary tract infection, site not specified: Secondary | ICD-10-CM

## 2024-02-20 DIAGNOSIS — Z923 Personal history of irradiation: Secondary | ICD-10-CM | POA: Insufficient documentation

## 2024-02-20 DIAGNOSIS — R1084 Generalized abdominal pain: Secondary | ICD-10-CM | POA: Diagnosis not present

## 2024-02-20 DIAGNOSIS — R519 Headache, unspecified: Secondary | ICD-10-CM | POA: Diagnosis not present

## 2024-02-20 DIAGNOSIS — G20A1 Parkinson's disease without dyskinesia, without mention of fluctuations: Secondary | ICD-10-CM | POA: Diagnosis not present

## 2024-02-20 DIAGNOSIS — Z8546 Personal history of malignant neoplasm of prostate: Secondary | ICD-10-CM | POA: Insufficient documentation

## 2024-02-20 DIAGNOSIS — R55 Syncope and collapse: Secondary | ICD-10-CM | POA: Diagnosis not present

## 2024-02-20 DIAGNOSIS — K627 Radiation proctitis: Secondary | ICD-10-CM | POA: Diagnosis not present

## 2024-02-20 DIAGNOSIS — Z8679 Personal history of other diseases of the circulatory system: Secondary | ICD-10-CM | POA: Insufficient documentation

## 2024-02-20 LAB — CBC WITH DIFFERENTIAL/PLATELET
Abs Immature Granulocytes: 0.02 K/uL (ref 0.00–0.07)
Basophils Absolute: 0 K/uL (ref 0.0–0.1)
Basophils Relative: 1 %
Eosinophils Absolute: 0.1 K/uL (ref 0.0–0.5)
Eosinophils Relative: 1 %
HCT: 36.8 % — ABNORMAL LOW (ref 39.0–52.0)
Hemoglobin: 11.4 g/dL — ABNORMAL LOW (ref 13.0–17.0)
Immature Granulocytes: 0 %
Lymphocytes Relative: 21 %
Lymphs Abs: 1.8 K/uL (ref 0.7–4.0)
MCH: 32.5 pg (ref 26.0–34.0)
MCHC: 31 g/dL (ref 30.0–36.0)
MCV: 104.8 fL — ABNORMAL HIGH (ref 80.0–100.0)
Monocytes Absolute: 0.9 K/uL (ref 0.1–1.0)
Monocytes Relative: 10 %
Neutro Abs: 5.8 K/uL (ref 1.7–7.7)
Neutrophils Relative %: 67 %
Platelets: 282 K/uL (ref 150–400)
RBC: 3.51 MIL/uL — ABNORMAL LOW (ref 4.22–5.81)
RDW: 12.8 % (ref 11.5–15.5)
WBC: 8.5 K/uL (ref 4.0–10.5)
nRBC: 0 % (ref 0.0–0.2)

## 2024-02-20 LAB — URINALYSIS, ROUTINE W REFLEX MICROSCOPIC
Bilirubin Urine: NEGATIVE
Glucose, UA: NEGATIVE mg/dL
Hgb urine dipstick: NEGATIVE
Ketones, ur: NEGATIVE mg/dL
Nitrite: NEGATIVE
Protein, ur: NEGATIVE mg/dL
Specific Gravity, Urine: 1.019 (ref 1.005–1.030)
WBC, UA: >50 WBC/hpf (ref 0–5)
pH: 5 (ref 5.0–8.0)

## 2024-02-20 LAB — COMPREHENSIVE METABOLIC PANEL WITH GFR
ALT: 10 U/L (ref 0–44)
AST: 18 U/L (ref 15–41)
Albumin: 3.8 g/dL (ref 3.5–5.0)
Alkaline Phosphatase: 41 U/L (ref 38–126)
Anion gap: 11 (ref 5–15)
BUN: 37 mg/dL — ABNORMAL HIGH (ref 8–23)
CO2: 27 mmol/L (ref 22–32)
Calcium: 9.4 mg/dL (ref 8.9–10.3)
Chloride: 106 mmol/L (ref 98–111)
Creatinine, Ser: 1.15 mg/dL (ref 0.61–1.24)
GFR, Estimated: >60 mL/min (ref >60)
Glucose, Bld: 115 mg/dL — ABNORMAL HIGH (ref 70–99)
Potassium: 5.4 mmol/L — ABNORMAL HIGH (ref 3.5–5.1)
Sodium: 144 mmol/L (ref 135–145)
Total Bilirubin: 0.8 mg/dL (ref 0.0–1.2)
Total Protein: 6.9 g/dL (ref 6.5–8.1)

## 2024-02-20 LAB — VITAMIN B12: Vitamin B-12: 1069 pg/mL — ABNORMAL HIGH (ref 180–914)

## 2024-02-20 LAB — T4, FREE: Free T4: 1.05 ng/dL (ref 0.61–1.12)

## 2024-02-20 LAB — RESP PANEL BY RT-PCR (RSV, FLU A&B, COVID)  RVPGX2
Influenza A by PCR: NEGATIVE
Influenza B by PCR: NEGATIVE
Resp Syncytial Virus by PCR: NEGATIVE
SARS Coronavirus 2 by RT PCR: NEGATIVE

## 2024-02-20 LAB — TSH: TSH: 2.192 u[IU]/mL (ref 0.350–4.500)

## 2024-02-20 LAB — FOLATE: Folate: 15.7 ng/mL (ref >5.9)

## 2024-02-20 LAB — TROPONIN I (HIGH SENSITIVITY)
Troponin I (High Sensitivity): 22 ng/L — ABNORMAL HIGH (ref <18)
Troponin I (High Sensitivity): 21 ng/L — ABNORMAL HIGH (ref <18)

## 2024-02-20 LAB — LIPASE, BLOOD: Lipase: 48 U/L (ref 11–51)

## 2024-02-20 MED ORDER — ACETAMINOPHEN 325 MG PO TABS: 650.0000 mg | ORAL_TABLET | Freq: Four times a day (QID) | ORAL | Status: DC | PRN

## 2024-02-20 MED ORDER — MECLIZINE HCL 25 MG PO TABS: 12.5000 mg | ORAL_TABLET | Freq: Three times a day (TID) | ORAL | Status: DC | PRN

## 2024-02-20 MED ORDER — SODIUM CHLORIDE 0.9 % IV SOLN
1.0000 g | INTRAVENOUS | Status: DC
  Administered 2024-02-21: 1 g via INTRAVENOUS
  Filled 2024-02-20: qty 10

## 2024-02-20 MED ORDER — ATORVASTATIN CALCIUM 20 MG PO TABS
20.0000 mg | ORAL_TABLET | Freq: Every day | ORAL | Status: DC
  Administered 2024-02-21: 20 mg via ORAL
  Filled 2024-02-20: qty 1

## 2024-02-20 MED ORDER — IPRATROPIUM BROMIDE 0.03 % NA SOLN: 2.0000 | Freq: Three times a day (TID) | NASAL | Status: DC | PRN

## 2024-02-20 MED ORDER — MIRABEGRON ER 50 MG PO TB24
50.0000 mg | ORAL_TABLET | Freq: Every day | ORAL | Status: DC
  Administered 2024-02-21: 50 mg via ORAL
  Filled 2024-02-20: qty 1

## 2024-02-20 MED ORDER — SODIUM CHLORIDE 0.9 % IV SOLN
2.0000 g | Freq: Once | INTRAVENOUS | Status: AC
  Administered 2024-02-20: 2 g via INTRAVENOUS
  Filled 2024-02-20: qty 20

## 2024-02-20 MED ORDER — LEVETIRACETAM ER 500 MG PO TB24
500.0000 mg | ORAL_TABLET | Freq: Every day | ORAL | Status: DC
  Administered 2024-02-21: 500 mg via ORAL
  Filled 2024-02-20: qty 1

## 2024-02-20 MED ORDER — ONDANSETRON HCL 4 MG/2ML IJ SOLN: 4.0000 mg | Freq: Four times a day (QID) | INTRAMUSCULAR | Status: DC | PRN

## 2024-02-20 MED ORDER — APIXABAN 2.5 MG PO TABS
2.5000 mg | ORAL_TABLET | Freq: Two times a day (BID) | ORAL | Status: DC
  Administered 2024-02-20 – 2024-02-21 (×2): 2.5 mg via ORAL
  Filled 2024-02-20 (×3): qty 1

## 2024-02-20 MED ORDER — IOHEXOL 300 MG/ML  SOLN
100.0000 mL | Freq: Once | INTRAMUSCULAR | Status: AC | PRN
  Administered 2024-02-20: 100 mL via INTRAVENOUS

## 2024-02-20 MED ORDER — ONDANSETRON HCL 4 MG PO TABS: 4.0000 mg | ORAL_TABLET | Freq: Four times a day (QID) | ORAL | Status: DC | PRN

## 2024-02-20 MED ORDER — ACETAMINOPHEN 650 MG RE SUPP: 650.0000 mg | Freq: Four times a day (QID) | RECTAL | Status: DC | PRN

## 2024-02-20 NOTE — ED Notes (Signed)
 Called CCMD to place pt on central monitoring

## 2024-02-20 NOTE — ED Notes (Signed)
 This NT assisted pt with using urinal, while pt stood beside the bed with assistance from this NT.

## 2024-02-20 NOTE — ED Provider Notes (Signed)
 [facility] Provider Note    Event Date/Time   First MD Initiated Contact with Patient 02/20/24 [id number]     (approximate)   History   No chief complaint on file.   HPI  Jeffrey [name] is a 89 y.o. male with known persistent A-fib, chronic systolic heart failure, aortic stenosis who comes in with concerns for dizziness.  Patient has known severe aortic stenosis but was not a candidate for TAVR.  Patient reports about a few days worth of worsening dizziness.  He reports having worsening dizziness mostly with standing.  He does report decreased p.o. and oral intake.  He states that he has had some lower abdominal pain as well.  He denies any chest pain, shortness of breath.  He reports no new falls.  Patient is on Eliquis .  He does have a history of CHF which he is on Lasix  for.  He denies any new vision changes, headaches, cough, shortness of breath, swelling.      Physical Exam   Triage Vital Signs: ED Triage Vitals  Encounter Vitals Group     BP      Girls Systolic BP Percentile      Girls Diastolic BP Percentile      Boys Systolic BP Percentile      Boys Diastolic BP Percentile      Pulse      Resp      Temp      Temp src      SpO2      Weight      Height      Head Circumference      Peak Flow      Pain Score      Pain Loc      Pain Education      Exclude from Growth Chart     Most recent vital signs: Vitals:   02/20/24 0940 02/20/24 1030  BP: (!) 131/97 122/65  Pulse: 88 80  Resp: 16 (!) 22  Temp:    SpO2: 95% 98%     General: Awake, no distress.  CV:  Good peripheral perfusion.  Resp:  Normal effort.  Abd:  No distention.  Tender in the lower abdomen Other:  Equal strength in arms and legs.  No obvious cranial nerve deficits.  Patient appears very weak and frail however.   ED Results / Procedures / Treatments   Labs (all labs ordered are listed, but only abnormal results are displayed) Labs Reviewed  CBC WITH  DIFFERENTIAL/PLATELET - Abnormal; Notable for the following components:      Result Value   RBC 3.51 (*)    Hemoglobin 11.4 (*)    HCT 36.8 (*)    MCV 104.8 (*)    All other components within normal limits  COMPREHENSIVE METABOLIC PANEL WITH GFR - Abnormal; Notable for the following components:   Potassium 5.4 (*)    Glucose, Bld 115 (*)    BUN 37 (*)    All other components within normal limits  TROPONIN I (HIGH SENSITIVITY) - Abnormal; Notable for the following components:   Troponin I (High Sensitivity) 22 (*)    All other components within normal limits  RESP PANEL BY RT-PCR (RSV, FLU A&B, COVID)  RVPGX2  LIPASE, BLOOD  URINALYSIS, ROUTINE W REFLEX MICROSCOPIC  TSH  T4, FREE  TROPONIN I (HIGH SENSITIVITY)     EKG  My interpretation of EKG:  Normal sinus rhythm 91 without any ST elevation or T  wave inversion, occasional PVC with right bundle branch block and left anterior fascicular block  RADIOLOGY I have reviewed the ct personally and interpreted NO ich    PROCEDURES:  Critical Care performed: No  Procedures   MEDICATIONS ORDERED IN ED: Medications  cefTRIAXone  (ROCEPHIN ) 2 g in sodium chloride  0.9 % 100 mL IVPB (has no administration in time range)  iohexol  (OMNIPAQUE ) 300 MG/ML solution 100 mL (100 mLs Intravenous Contrast Given 02/20/24 1129)     IMPRESSION / MDM / ASSESSMENT AND PLAN / ED COURSE  I reviewed the triage vital signs and the nursing notes.   Patient's presentation is most consistent with acute presentation with potential threat to life or bodily function.   Patient comes in with increasing weakness and lightheadedness with standing.  Patient has known severe aortic stenosis so I suspect that this is related to that.  He has no obvious falls but does complain of abdominal pain so we will get CT imaging of the abdomen to ensure no acute process there as well get a CT head to make sure no evidence of intercranial hemorrhage given he is on  Eliquis .  He denies any chest pain or shortness of breath doubt PE given on Eliquis .  Patient is in A-fib but he is currently rate controlled.  He does have a reduced EF so I am hesitant to give any fluids given his orthostatics are negative.  Consider the possibility of stroke given the dizziness but he reports is worse with standing.  Last known well was more than 24 hours ago.  Nonfocal logical exam.  Considered possibility of posterior stroke but unlikely LVO and symptoms have been ongoing for more than 24 hours  Thyroid  is reassuring.  COVID and flu are negative CBC shows hemoglobin that is stable.  CMP shows stably elevated BUN, potassium levels.  Troponin slightly elevated but similar to prior will continue to trend out.  Patient's urine is positive for UTI does report some increased urinary frequency.  Attempted to stand patient up but does seem very wobbly.    Will discuss to the hospital team for admission for dizziness workup we will order an MRI brain for them to follow-up with as well as treatment for UTI.  He denied any infectious symptoms to suggest pneumonia.  The patient is on the cardiac monitor to evaluate for evidence of arrhythmia and/or significant heart rate changes.      FINAL CLINICAL IMPRESSION(S) / ED DIAGNOSES   Final diagnoses:  Dizziness  Acute cystitis without hematuria     Rx / DC Orders   ED Discharge Orders     None        Note:  This document was prepared using Dragon voice recognition software and may include unintentional dictation errors.   Ernest Ronal BRAVO, MD 02/20/24 [phone]

## 2024-02-20 NOTE — ED Notes (Signed)
 This RN assisted pt to stand at bedside. Pt was able to urinate at this time. Urine sample collected. Pt returned to bed.

## 2024-02-20 NOTE — Consult Note (Signed)
 Consultation Note Date: 02/20/2024   Patient Name: Jeffrey [name]  DOB: 04-13-1933  MRN: [id number]  Age / Sex: 89 y.o., male  PCP: Randol Dawes, MD Referring Physician: Laurita Cort DASEN, MD  Reason for Consultation: Establishing goals of care   HPI/Brief Hospital Course: 89 y.o. male  with past medical history of HFrEF EF 30-35%, severe aortic stenosis, A. Fib, HTN, HLD, history of TIA, history of prostate cancer s/p radiation therapy, Parkinson's disease and seizure disorder admitted from home on 02/20/2024 with near syncope.  Reported from family, day of admission Mr. Worth had multiple spells of lightheadedness and moments of him staring off for a few minutes Day of admission this type of spell last about 10-20 minutes and started to c/o leg weakness, having difficulty standing  CT head (-) MRI brain (-) CT abd/pelvis-possible pneumonia EEG-ordered  Noted to have been evaluated by Dr. Darron as well as Dr. Verlin for TAVR--found to not be a candidate  Palliative medicine was consulted for assisting with goals of care conversations.  Subjective:  Extensive chart review has been completed prior to meeting patient including labs, vital signs, imaging, progress notes, orders, and available advanced directive documents from current and previous encounters.  Visited with Mr. Warga at his bedside. He is resting in bed with eyes closed, dose not acknowledge my presence in room. Wife and daughter at bedside during time of visit.  Introduced myself as a Publishing rights manager as a member of the palliative care team. Explained palliative medicine is specialized medical care for people living with serious illness. It focuses on providing relief from the symptoms and stress of a serious illness. The goal is to improve quality of life for both the patient and the family.   Wife shares a brief life review. She and Mr. Bellucci have been married close to 60 years. They have 2  sons and 2 daughters in total, all live local to the area and [place].  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  He enjoyed surf fishing at their vacation home in the [place].  Wife shares Mr. Rickel has suffered an overall cognitive and functional decline in the last year.  She and Mr. Doell live in the home alone, they have an aide that comes in and out and helps them with daily tasks.  At baseline prior to admission Mr. Pullman attempts to remain as independent as he is able, request assistance occasionally with bathing and dressing.  He is still able to ambulate short distances with a cane or a walker although he has suffered multiple mechanical falls in the past 1 to 2 years.  Wife speaks to Mr. Foxes poor appetite and weight loss.  Shares poor appetite and weight loss have been an issue he has dealt with for well over a year, reports his last full meal was several months ago.  She speaks to his difficulty with swallowing and only able to tolerate soft foods.  We discussed patient's current illness and what it means in the larger context of patient's on-going co-morbidities. Natural disease trajectory and expectations at EOL were discussed.  We discussed his underlying poor cardiac function, family aware not a candidate for TAVR.  Attempted to elicit goals of care.  Wife shares Mr. Silvestro has not completed advanced directives in the past.  Discussed without advance directive wife considered next of kin and will serve as surrogate decision-maker if Mr. Witter unable to participate in GOC conversations. Wife shares earlier in  the day, Mr. Carton was awake and conversing, aware he was in the hospital. Wife shares Mr. Manninen is not aware of the extent of his multiple underlying chronic medical conditions.  We discussed CODE STATUS and the difference between full code and DO NOT RESUSCITATE. Encouraged family to consider DNR/DNI status understanding evidenced based poor outcomes in similar hospitalized  patients, as the cause of the arrest is likely associated with chronic/terminal disease rather than a reversible acute cardio-pulmonary event.  Family verbalizes understanding, remains Full Code.  I discussed importance of continued conversations with family/support persons and all members of their medical team regarding overall plan of care and treatment options ensuring decisions are in alignment with patients goals of care.  All questions/concerns addressed. Emotional support provided to patient/family/support persons. PMT will continue to follow and support patient as needed.   Objective: Primary Diagnoses: Present on Admission: **None**   Physical Exam Constitutional:      General: He is not in acute distress.    Appearance: He is ill-appearing.  HENT:     Head: Normocephalic.  Pulmonary:     Effort: Pulmonary effort is normal. No respiratory distress.  Skin:    General: Skin is warm and dry.  Neurological:     Motor: Weakness present.     Vital Signs: BP 126/81   Pulse (!) 103   Temp 98 F (36.7 C) (Oral)   Resp 20   Ht 5' 11 (1.803 m)   Wt 55.9 kg   SpO2 97%   BMI 17.19 kg/m       IO: Intake/output summary:  Intake/Output Summary (Last 24 hours) at 02/20/2024 1703 Last data filed at 02/20/2024 1357 Gross per 24 hour  Intake 100 ml  Output --  Net 100 ml    LBM:   Baseline Weight: Weight: 55.9 kg Most recent weight: Weight: 55.9 kg      Assessment and Plan  SUMMARY OF RECOMMENDATIONS   Ongoing GOC conversations needed PMT to continue to follow  Palliative Prophylaxis:   Bowel Regimen, Delirium Protocol and Frequent Pain Assessment   Thank you for this consult and allowing Palliative Medicine to participate in the care of Elsie HILARIO Brinda. Palliative medicine will continue to follow and assist as needed.    Visit includes: Detailed review of medical records (labs, imaging, vital signs), medically appropriate exam (mental status, respiratory,  cardiac, skin), discussed with treatment team, counseling and educating patient, family and staff, documenting clinical information, medication management and coordination of care.   Signed by: Waddell Lesches, DNP, AGNP-C Palliative Medicine    Please contact Palliative Medicine Team phone at [phone] for questions and concerns.  For individual provider: See Tracey

## 2024-02-20 NOTE — Progress Notes (Signed)
 The patient refused to have orthostatic BP taken at 2000. The patient would only allow for lying BP to be taken. The patient told this writer that he had already had them done earlier today and he did not want to be bothered. This Clinical research associate educated the patient on the importance of checking his orthostatic BP. The patient said that he understood.

## 2024-02-20 NOTE — ED Notes (Signed)
 MRI called to screen patient

## 2024-02-20 NOTE — H&P (Addendum)
 History and Physical    Jeffrey SKOG FMW:[id number] DOB: 12/07/1932 DOA: 02/20/2024  PCP: Jeffrey [name] (Confirm with patient/family/NH records and if not entered, this has to be entered at [facility] point of entry) Patient coming from: Home  I have personally briefly reviewed patient's old medical records in [facility]  Chief Complaint: Feeling dizzy  HPI: Jeffrey [name] is a 89 y.o. male with medical history significant of chronic HFrEF with worsening of LVEF recently 30-35%, severe aortic stenosis, chronic A-fib, seizure disorder, HTN, HLD, Parkinson's disease presented with near syncope.  Patient family reported that patient has been having  spells almost daily when he will suddenly become  staring for few minutes and occasionally he fell.  The episode usually lasted short than 3 to 5 minutes, and family found the triggers including heat and  too much light and usually the episode can be relieved by putting patient in a cool and quiet place.  This morning patient started to complain about feeling lightheaded, flu followed patient had another episode of staring and not responsive to family members, and this time however after 10 to 20 minutes patient remained staring and confused.  Later when patient came out of episode he started to complain about worsening feeling lightheaded  cannot control legs and family found patient has trouble standing walking and called EMS.  Patient denied any chest pain, no shortness of breath or leg swelling.  Patient does have history of prostate cancer status post radiation therapy, he does have increasing nocturnal urination but denied any dysuria no back pain no fever or chills.  ED Course: Afebrile, borderline tachycardia blood pressure SBP 140-150 O2 saturation 96% on room air.  UA showed RBC 1+ and WBC 3+.  Patient was started on ceftriaxone .  CT head negative for acute findings. EKG showed sinus rhythm and RBBB, mild prolongation of QTc.  No acute  PR  Review of Systems: As per HPI otherwise 14 point review of systems negative.    Past Medical History:  Diagnosis Date   Acute CVA (cerebrovascular accident) (HCC)    Acute encephalopathy 11/27/2014   Aortic atherosclerosis 11/25/2014   Atrial fibrillation (HCC)    a. 12/2018 [facility]: Sinus rhythm, avg rate 71. 2 runs of VT (max 17 beats). 27 runs SVT (max 14 beats). Occas PACs/PVCs. No high grade AV block/pauses.   Chronic combined systolic (congestive) and diastolic (congestive) heart failure (HCC)    a. 10/2014 Echo: EF 40-45%; b. 03/2018 Echo: EF 55-60%; c. 12/2018 Echo: EF 55-60%, mild conc LVH. Nl RV fxn. RVSP 51.36mmHg. Mildly dil LA. Mod AS w/ severe Ca2+; d. 12/2019 Echo: EF 50-55%, mild LVH, gr2 DD. Sev elev PASP. Mild MR, mild to mod TR. Triv AI, mod to sev AS (area 0.77cm^2, mean grad . Vmax 2.98m/s).   Diabetes mellitus    Hypercholesteremia    Hypertension    Impaired fasting glucose    Macular degeneration    Moderate to severe aortic stenosis    a. 12/2018 Echo: EF 55-60%, Mod AS w/ sev AoV Ca2+; b. 12/2019 Echo: EF 50-55%. Mod to sev AS (area 0.77cm^2, mean grad . Vmax 2.54m/s).   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 Albertus)   RMSF [facility] spotted fever) 10/2014   (hosp with FUO, encephalopathy)   Rotator cuff tear,  right 10/2010   supraspinatous and infraspinatous   Seizure disorder (HCC) 07/25/2019   TIA (transient ischemic attack) 10/02/13   hosp at [facility] x 1 night   Type 2 diabetes mellitus with microalbuminuria or microproteinuria 01/15/2014    Past Surgical History:  Procedure Laterality Date   CATARACT EXTRACTION Right 01/2017   CIRCUMCISION  01/2017   Dr. Nicholaus   COLONOSCOPY N/A 11/28/2012   Procedure: COLONOSCOPY;  Surgeon: Gordy CHRISTELLA Starch, [name];  Location: [facility] ENDOSCOPY;   Service: Gastroenterology;  Laterality: N/A;   INTERNAL URETHROTOMY  01/27/2016   [facility]   prostate seed implant     ROTATOR CUFF REPAIR  06/16/2011   right (Dr. Yvone)   TONSILLECTOMY AND ADENOIDECTOMY  age 20     reports that he quit smoking about 43 years ago. His smoking use included cigarettes. He started smoking about 63 years ago. He has a 20 pack-year smoking history. He has never used smokeless tobacco. He reports current alcohol use of about 3.0 standard drinks of alcohol per week. He reports that he does not use drugs.  No Known Allergies  Family History  Problem Relation Age of Onset   Dementia Mother    Diabetes Mother    Heart disease Mother    Hypertension Mother    Heart disease Father 55       Died suddenly.  No diagnosis   Stroke Sister    Cancer Daughter        ovarian (?) vs other male cancer; s/p hyst doing well   Diabetes Daughter        GDM, and now AODM   Heart disease Son        congestive heart failure--improved   Congestive Heart Failure Grandchild      Prior to Admission medications   Medication Sig Start Date End Date Taking? Authorizing Provider  acetaminophen  (TYLENOL ) 500 MG tablet Take 500 mg by mouth 2 (two) times daily.    Provider, Historical, [name]  apixaban  (ELIQUIS ) 2.5 MG TABS tablet TAKE ONE TABLET (2.5 MG) BY MOUTH TWICE DAILY 10/06/23   Darron Deatrice LABOR, [name]  atorvastatin  (LIPITOR) 20 MG tablet TAKE 1 TABLET BY MOUTH ONCE DAILY 09/06/23   Darron Deatrice LABOR, [name]  Cyanocobalamin  (VITAMIN B 12 PO) Take 1,000 mcg by mouth.    Provider, Historical, [name]  furosemide  (LASIX ) 20 MG tablet Take 1 tablet (20 mg total) by mouth every other day. 01/11/24   Arida, Muhammad A, [name]  ipratropium (ATROVENT ) 0.03 % nasal spray Place 2 sprays into both nostrils 3 (three) times daily as needed for rhinitis. 09/20/22   Jeffrey [name]  Iron, Ferrous Sulfate, 325 (65 Fe) MG TABS Take by mouth.    Provider, Historical, [name]  levETIRAcetam  (KEPPRA  XR) 500 MG 24 hr tablet TAKE  ONE TABLET BY MOUTH ONCE DAILY 09/08/23   Sethi, Pramod S, [name]  loperamide (IMODIUM) 2 MG capsule Take 2 mg by mouth as needed for diarrhea or loose stools. Patient not taking: Reported on 01/11/2024    Provider, Historical, [name]  loratadine (CLARITIN) 10 MG tablet Take by mouth. Patient not taking: Reported on 01/11/2024 11/17/18   Provider, Historical, [name]  mirabegron  ER (MYRBETRIQ ) 50 MG TB24 tablet Take 1 tablet (50 mg total) by mouth daily. 02/08/24   Helon Kirsch A, PA-C  Multiple Vitamins-Minerals (PRESERVISION AREDS) CAPS Take 1 capsule by mouth 2 (two) times daily.     Provider, Historical, [name]  UNABLE TO FIND Bevacizumab (AVASTIN ) chemo injection for the eye  every 10 weeks    Provider, Historical, [name]    Physical Exam: Vitals:   02/20/24 1100 02/20/24 1245 02/20/24 1300 02/20/24 1330  BP: 128/85 (!) 142/96 (!) 141/61 (!) 152/96  Pulse: 81 74 76 90  Resp: 19 20 (!) 21 20  Temp:      TempSrc:      SpO2: 99% 98% 100% 96%  Weight:      Height:        Constitutional: NAD, calm, comfortable Vitals:   02/20/24 1100 02/20/24 1245 02/20/24 1300 02/20/24 1330  BP: 128/85 (!) 142/96 (!) 141/61 (!) 152/96  Pulse: 81 74 76 90  Resp: 19 20 (!) 21 20  Temp:      TempSrc:      SpO2: 99% 98% 100% 96%  Weight:      Height:       Eyes: PERRL, lids and conjunctivae normal ENMT: Mucous membranes are moist. Posterior pharynx clear of any exudate or lesions.Normal dentition.  Neck: normal, supple, no masses, no thyromegaly Respiratory: clear to auscultation bilaterally, no wheezing, no crackles. Normal respiratory effort. No accessory muscle use.  Cardiovascular: Regular rate and rhythm, blowing back systolic murmur on carb-based, diminished S2. No extremity edema. 2+ pedal pulses. No carotid bruits.  Abdomen: no tenderness, no masses palpated. No hepatosplenomegaly. Bowel sounds positive.  Musculoskeletal: no clubbing / cyanosis. No joint deformity upper and lower extremities. Good ROM, no  contractures. Normal muscle tone.  Skin: no rashes, lesions, ulcers. No induration Neurologic: CN 2-12 grossly intact. Sensation intact, DTR normal. Strength 5/5 in all 4.  Resting tremors on upper extremities Psychiatric: Normal judgment and insight. Alert and oriented x 3. Normal mood.     Labs on Admission: I have personally reviewed following labs and imaging studies  CBC: Recent Labs  Lab 02/20/24 0915  WBC 8.5  NEUTROABS 5.8  HGB 11.4*  HCT 36.8*  MCV 104.8*  PLT 282   Basic Metabolic Panel: Recent Labs  Lab 02/20/24 0915  NA 144  K 5.4*  CL 106  CO2 27  GLUCOSE 115*  BUN 37*  CREATININE 1.15  CALCIUM  9.4   GFR: Estimated Creatinine Clearance: 34.4 mL/min (by C-G formula based on SCr of 1.15 mg/dL). Liver Function Tests: Recent Labs  Lab 02/20/24 0915  AST 18  ALT 10  ALKPHOS 41  BILITOT 0.8  PROT 6.9  ALBUMIN 3.8   Recent Labs  Lab 02/20/24 0915  LIPASE 48   No results for input(s): AMMONIA in the last 168 hours. Coagulation Profile: No results for input(s): INR, PROTIME in the last 168 hours. Cardiac Enzymes: No results for input(s): CKTOTAL, CKMB, CKMBINDEX, TROPONINI in the last 168 hours. BNP (last 3 results) No results for input(s): PROBNP in the last 8760 hours. HbA1C: No results for input(s): HGBA1C in the last 72 hours. CBG: No results for input(s): GLUCAP in the last 168 hours. Lipid Profile: No results for input(s): CHOL, HDL, LDLCALC, TRIG, CHOLHDL, LDLDIRECT in the last 72 hours. Thyroid  Function Tests: Recent Labs    02/20/24 0915  TSH 2.192  FREET4 1.05   Anemia Panel: No results for input(s): VITAMINB12, FOLATE, FERRITIN, TIBC, IRON, RETICCTPCT in the last 72 hours. Urine analysis:    Component Value Date/Time   COLORURINE YELLOW (A) 02/20/2024 0920   APPEARANCEUR HAZY (A) 02/20/2024 0920   APPEARANCEUR Clear 10/01/2013 0128   LABSPEC 1.019 02/20/2024 0920   LABSPEC 1.020  03/14/2023 1315   PHURINE 5.0 02/20/2024 0920   GLUCOSEU NEGATIVE  02/20/2024 0920   GLUCOSEU Negative 10/01/2013 0128   HGBUR NEGATIVE 02/20/2024 0920   BILIRUBINUR NEGATIVE 02/20/2024 0920   BILIRUBINUR negative 03/14/2023 1315   BILIRUBINUR neg 04/28/2015 1247   BILIRUBINUR Negative 10/01/2013 0128   KETONESUR NEGATIVE 02/20/2024 0920   PROTEINUR NEGATIVE 02/20/2024 0920   UROBILINOGEN negative 04/28/2015 1247   UROBILINOGEN 0.2 11/27/2014 1944   NITRITE NEGATIVE 02/20/2024 0920   LEUKOCYTESUR MODERATE (A) 02/20/2024 0920   LEUKOCYTESUR Negative 10/01/2013 0128    Radiological Exams on Admission: CT ABDOMEN PELVIS W CONTRAST Result Date: 02/20/2024 CLINICAL DATA:  Dizziness, weakness, abdominal pain for 3 days. EXAM: CT ABDOMEN AND PELVIS WITH CONTRAST TECHNIQUE: Multidetector CT imaging of the abdomen and pelvis was performed using the standard protocol following bolus administration of intravenous contrast. RADIATION DOSE REDUCTION: This exam was performed according to the departmental dose-optimization program which includes automated exposure control, adjustment of the mA and/or kV according to patient size and/or use of iterative reconstruction technique. CONTRAST:  OMNIPAQUE  IOHEXOL  300 MG/ML  SOLN COMPARISON:  None Available. FINDINGS: Lower chest: Patchy peribronchovascular ground-glass in the lower lobes. Heart is enlarged left ventricular dilatation. Atherosclerotic calcification of the aorta, aortic valve and coronary arteries. No pericardial or pleural effusion. Distal esophagus is grossly unremarkable. Hepatobiliary: Liver is slightly decreased in attenuation diffusely. Liver and gallbladder are otherwise unremarkable. No biliary ductal dilatation. Pancreas: Negative. Spleen: Negative. Adrenals/Urinary Tract: Adrenal glands are unremarkable. Renal cortical scarring and thinning bilaterally. Subcentimeter low-attenuation lesion in the right kidney, too small to characterize.  No specific follow-up necessary. Ureters are decompressed. Bladder is grossly unremarkable. Stomach/Bowel: Stomach, small bowel and appendix are unremarkable. Moderate stool burden. Vascular/Lymphatic: Atherosclerotic calcification of the aorta. No pathologically enlarged lymph nodes. Mildly ectatic left common iliac artery. Reproductive: Radiotherapy sees in the prostate. Other: No free fluid.  Mesenteries and peritoneum are unremarkable. Musculoskeletal: Osteopenia.  Degenerative changes in the spine. IMPRESSION: 1. Mild peribronchovascular ground-glass in both lower lobes may be due to bronchopneumonia. 2. Moderate stool burden. 3. Hepatic steatosis. 4. Aortic atherosclerosis (ICD10-I70.0). Coronary artery calcification. Electronically Signed   By: Newell Eke M.D.   On: 02/20/2024 12:35   CT HEAD WO CONTRAST ( ) Result Date: 02/20/2024 CLINICAL DATA:  89 year old male with dizziness and weakness, new onset headache. Atrial fibrillation. EXAM: CT HEAD WITHOUT CONTRAST TECHNIQUE: Contiguous axial images were obtained from the base of the skull through the vertex without intravenous contrast. RADIATION DOSE REDUCTION: This exam was performed according to the departmental dose-optimization program which includes automated exposure control, adjustment of the mA and/or kV according to patient size and/or use of iterative reconstruction technique. COMPARISON:  Brain MRI 06/14/2018.  Head CT 03/19/2023. FINDINGS: Brain: Stable cerebral volume from last year. No midline shift, ventriculomegaly, mass effect, evidence of mass lesion, intracranial hemorrhage or evidence of cortically based acute infarction. Chronic Patchy and confluent bilateral periventricular and cerebral white matter hypodensity, stable. Mild basal ganglia vascular calcifications greater on the right. Vascular: Calcified atherosclerosis at the skull base. No suspicious intracranial vascular hyperdensity. Skull: Stable and intact. Sinuses/Orbits:  Stable from last year paranasal sinuses and mastoids, well aerated. Tympanic cavities appear clear. Other: No acute orbit or scalp soft tissue finding. Calcified scalp vessel atherosclerosis. IMPRESSION: 1. No acute intracranial abnormality. 2. Chronic cerebral white matter disease stable since last year. 3. Advanced calcified atherosclerosis. Electronically Signed   By: VEAR Hurst M.D.   On: 02/20/2024 12:27    EKG: Independently reviewed.  Sinus, chronic RBBB, prolonged QTc 522  Assessment/Plan  Principal Problem:   AMS (altered mental status) Active Problems:   UTI (urinary tract infection)   Ataxia  (please populate well all problems here in Problem List. (For example, if patient is on BP meds at home and you resume or decide to hold them, it is a problem that needs to be her. Same for CAD, COPD, HLD and so on)  AMS Acute ataxia Near syncope History of severe aortic stenosis History of chronic HFrEF - Differential is broad provide at this point, clinically forced suspect decompensated severe aortic stenosis and near syncope.  Went to patient cardiology records showed patient has not developed any worsening of CHF with worsening of LVEF to 30-35% this year and on top of his severe aortic stenosis patient was referred to see cardiology at [facility] to discuss about TAVR. - Patient appeared to be volume contracted at this point, we will hold off Lasix  today - Other DDx, breakthrough seizure cannot be ruled out, EEG ordered.  Continue Keppra .  Brain MRI ordered by ED physician to rule out posterior stroke. - PT evaluation -Trial of Antivert   UTI - Isolated event, less likely contributing to his CNS symptoms - Continue ceftriaxone  -Check PVR- -consider Flomax  History of absence seizure - As above - Continue Keppra  - EEG ordered  PAF - In sinus rhythm - Continue Eliquis   Parkinson's disease - Poorly controlled, unfortunately patient did not tolerate any of the Parkinson  medications.  Prolonged Qtc - Not on any beta blocking agent - Repeat EKG tomorrow  Code status -Went through recent echocardiogram results with patient and family including wife and daughter at bedside, patient has severe aortic stenosis and worsening of LVEF with significant decrease from 50-55% in 2023 to 30 to 35% March of this year.  Patient was referred to see interventional cardiology for discussion of TAVR, however he was considered to be a noncandidate for TAVR.  None evident hide with family regarding prognosis, I estimated that the patient have about 1 year to live given his worsening of near syncope symptoms.  Family and patient however at this point not ready to discuss about comfort care and or desire patient remain full code for now.  Family agreed with palliative care.  DVT prophylaxis: Eliquis  Code Status: Full code Family Communication: Wife daughter at bedside Disposition Plan: Expect less than 2 midnight hospital stay Consults called: None Admission status: Telemetry observation   Cort ONEIDA Mana [name] [facility] Pager [phone]  02/20/2024, 2:24 PM

## 2024-02-20 NOTE — ED Triage Notes (Signed)
 Pt presents to the ED via [facility] from home. Pt has had dizziness and weakness x1 month. Reports hx of parkinson's, htn, and afib.  Pt reports abdominal pain x3 days.  Pt A&x2  CBG 121 BP 116/84 93% RA

## 2024-02-20 NOTE — ED Notes (Signed)
Zhang, MD at bedside.  

## 2024-02-21 ENCOUNTER — Observation Stay

## 2024-02-21 DIAGNOSIS — Z7189 Other specified counseling: Secondary | ICD-10-CM | POA: Diagnosis not present

## 2024-02-21 DIAGNOSIS — R000 Tachycardia, unspecified: Secondary | ICD-10-CM | POA: Diagnosis not present

## 2024-02-21 DIAGNOSIS — R569 Unspecified convulsions: Secondary | ICD-10-CM | POA: Diagnosis not present

## 2024-02-21 DIAGNOSIS — R55 Syncope and collapse: Secondary | ICD-10-CM | POA: Diagnosis not present

## 2024-02-21 DIAGNOSIS — I491 Atrial premature depolarization: Secondary | ICD-10-CM | POA: Diagnosis not present

## 2024-02-21 DIAGNOSIS — R4182 Altered mental status, unspecified: Secondary | ICD-10-CM | POA: Diagnosis not present

## 2024-02-21 LAB — CBC
HCT: 35.8 % — ABNORMAL LOW (ref 39.0–52.0)
Hemoglobin: 11.2 g/dL — ABNORMAL LOW (ref 13.0–17.0)
MCH: 32.5 pg (ref 26.0–34.0)
MCHC: 31.3 g/dL (ref 30.0–36.0)
MCV: 103.8 fL — ABNORMAL HIGH (ref 80.0–100.0)
Platelets: 273 K/uL (ref 150–400)
RBC: 3.45 MIL/uL — ABNORMAL LOW (ref 4.22–5.81)
RDW: 12.6 % (ref 11.5–15.5)
WBC: 8.3 K/uL (ref 4.0–10.5)
nRBC: 0 % (ref 0.0–0.2)

## 2024-02-21 LAB — BASIC METABOLIC PANEL WITH GFR
Anion gap: 11 (ref 5–15)
BUN: 29 mg/dL — ABNORMAL HIGH (ref 8–23)
CO2: 26 mmol/L (ref 22–32)
Calcium: 9.1 mg/dL (ref 8.9–10.3)
Chloride: 107 mmol/L (ref 98–111)
Creatinine, Ser: 1.01 mg/dL (ref 0.61–1.24)
GFR, Estimated: >60 mL/min (ref >60)
Glucose, Bld: 110 mg/dL — ABNORMAL HIGH (ref 70–99)
Potassium: 4.6 mmol/L (ref 3.5–5.1)
Sodium: 144 mmol/L (ref 135–145)

## 2024-02-21 LAB — PROCALCITONIN: Procalcitonin: <0.1 ng/mL

## 2024-02-21 MED ORDER — SENNOSIDES-DOCUSATE SODIUM 8.6-50 MG PO TABS
1.0000 | ORAL_TABLET | Freq: Two times a day (BID) | ORAL | Status: DC
Start: 2024-02-21 — End: 2024-02-21
  Administered 2024-02-21: 1 via ORAL
  Filled 2024-02-21: qty 1

## 2024-02-21 MED ORDER — AMOXICILLIN-POT CLAVULANATE 875-125 MG PO TABS: 1.0000 | ORAL_TABLET | Freq: Two times a day (BID) | ORAL | 0 refills | Status: AC

## 2024-02-21 MED ORDER — POLYETHYLENE GLYCOL 3350 17 G PO PACK
17.0000 g | PACK | Freq: Every day | ORAL | Status: DC
  Administered 2024-02-21: 17 g via ORAL
  Filled 2024-02-21: qty 1

## 2024-02-21 NOTE — Progress Notes (Addendum)
 Daily Progress Note   Patient Name: Jeffrey [name]       Date: 02/21/2024 DOB: 12-17-1932  Age: 89 y.o. MRN#: [id number] Attending Physician: Jhonny Calvin NOVAK, MD Primary Care Physician: Randol Dawes, MD Admit Date: 02/20/2024  Reason for Consultation/Follow-up: Establishing goals of care  Subjective: Notes and labs reviewed.  In to see patient.  Attending is currently at bedside discussing EEG and antibiotic therapy.  Family discusses his change in mentation since yesterday and concern for this being due to being hospitalized.  They discussed the desire to take him home when his workup is completed.  They request home health.   Following attendings departure, they discuss his staring off spells and their concern for falls. Further explanation of EEG procedure and antibiotic therapy.  Questions answered.  Patient and family discuss his Bernarda Prescott.  They discussed the dog's personality, but that they have always had sheltie dogs.  Patient is retired [employer] examiner.  Family discusses their individual thoughts on care moving forward. I broached goals of care.  I inquired of the patient his feelings on care moving forward, and he was unsure.  To support holistic care, inquired if he had a faith or beliefs that he uses to help make decisions; family declines.    Discussed importance of determining what is important to patient and family, and medical teams viewing patient as a whole. Discussed that honoring the patient's wishes and patient autonomy is most important.  Discussed determining if there are boundaries for acceptable quality of life, and if so what they are. Discussed determining what care he would or would not be okay with. They plan to discuss further at home. Reinforced plans for home  health to be set up in the home.  They are amenable to outpatient palliative, as they understand his severe aortic stenosis and progression.  Primary team updated on conversation.  Length of Stay: 0  Current Medications: Scheduled Meds:   apixaban   2.5 mg Oral BID   atorvastatin   20 mg Oral Daily   levETIRAcetam   500 mg Oral Daily   mirabegron  ER  50 mg Oral Daily   polyethylene glycol  17 g Oral Daily   senna-docusate  1 tablet Oral BID    Continuous Infusions:  cefTRIAXone  (ROCEPHIN )  IV 1 g (02/21/24 0820)  PRN Meds: acetaminophen  **OR** acetaminophen , ipratropium, meclizine , ondansetron  **OR** ondansetron  (ZOFRAN ) IV  Physical Exam Pulmonary:     Effort: Pulmonary effort is normal.  Skin:    General: Skin is warm and dry.  Neurological:     Mental Status: He is alert.             Vital Signs: BP 130/80 (BP Location: Right Arm)   Pulse 80   Temp 98.1 F (36.7 C)   Resp 18   Ht 5' 11 (1.803 m)   Wt 55.9 kg   SpO2 98%   BMI 17.19 kg/m  SpO2: SpO2: 98 % O2 Device: O2 Device: Room Air O2 Flow Rate:    Intake/output summary:  Intake/Output Summary (Last 24 hours) at 02/21/2024 1223 Last data filed at 02/21/2024 0900 Gross per 24 hour  Intake 220 ml  Output 200 ml  Net 20 ml   LBM: Last BM Date : 02/21/24 Baseline Weight: Weight: 55.9 kg Most recent weight: Weight: 55.9 kg    Patient Active Problem List   Diagnosis Date Noted   AMS (altered mental status) 02/20/2024   UTI (urinary tract infection) 02/20/2024   Ataxia 02/20/2024   Parkinsonism (HCC) 10/28/2022   Frequent falls 10/28/2022   Mild protein-calorie malnutrition 02/20/2021   Nocturia 10/01/2020   Urinary frequency 10/01/2020   History of prostate cancer 10/01/2020   History of urethral stricture 10/01/2020   Pain due to onychomycosis of toenails of both feet 07/07/2020   Hematoma 07/07/2020   Seizure disorder (HCC) 07/25/2019   Obstructive sleep apnea 06/27/2018   Pulmonary  hypertension (HCC) 06/27/2018   Exudative age-related macular degeneration (HCC) 10/27/2017   Combined forms of age-related cataract of left eye 07/07/2017   Pseudophakia of right eye 07/07/2017   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 06/05/2017   Long term current use of anticoagulant 05/28/2017   Type 2 diabetes mellitus with microalbuminuria, without long-term current use of insulin  (HCC) 05/28/2017   Status post release of urethral stricture 01/27/2016   Right ventricular systolic dysfunction 01/22/2016   Nonexudative age-related macular degeneration 01/15/2016   H/[facility] spotted fever 07/30/2015   Posterior vitreous detachment, bilateral 05/15/2015   Aortic valve stenosis 04/11/2015   Debility 02/04/2015   Cardiomyopathy (HCC) 01/09/2015   Paroxysmal atrial fibrillation (HCC) 12/24/2014   Stroke with cerebral ischemia (HCC) 12/03/2014   Pre-diabetes    Acute on chronic combined systolic and diastolic CHF (congestive heart failure) (HCC)    Acute CVA (cerebrovascular accident) (HCC)    Left carotid artery stenosis 11/30/2014   Lacunar infarct, acute (HCC)    Aortic atherosclerosis 11/25/2014   Macular atrophy, retinal 01/24/2014   DM (diabetes mellitus), type 2 with peripheral vascular complications (HCC) 01/15/2014   Awareness alteration, transient 12/26/2013   Low serum testosterone  level 12/25/2013   TIA (transient ischemic attack) 10/10/2013   Radiation proctitis 11/28/2012   Colon cancer screening 11/28/2012   Nuclear sclerotic cataract, bilateral 05/20/2011   Murmur 02/23/2011   Type 2 diabetes mellitus with hypercholesterolemia (HCC) 02/05/2011   Essential hypertension, benign 11/26/2010   Impaired fasting glucose 11/26/2010   Pure hypercholesterolemia 11/26/2010   Prostate cancer (HCC) 11/26/2010    Palliative Care Assessment & Plan    Recommendations/Plan:  Recommend outpatient palliative.   Code Status:    Code Status Orders  (From  admission, onward)           Start     Ordered   02/20/24 1405  Full code  Continuous  Question:  By:  Answer:  Consent: discussion documented in EHR   02/20/24 1405           Code Status History     Date Active Date Inactive Code Status Order ID Comments User Context   12/03/2014 1827 12/10/2014 1235 Full Code [id number]  Maurice Sharlet GORMAN DEVONNA Inpatient   11/27/2014 2141 12/03/2014 1827 Full Code [id number]  Hilma Rankins, MD Inpatient    Thank you for allowing the Palliative Medicine Team to assist in the care of this patient.    Camelia Lewis, NP  Please contact Palliative Medicine Team phone at [phone] for questions and concerns.

## 2024-02-21 NOTE — Progress Notes (Signed)
 This writer went into the patient's room and noted blood on his shirt and a small skin tear to his Left elbow. The patient told this writer that he fell while trying to walk to the bathroom. The patient told this writer that he was able to get back in the bed on his own. The patient's bed was in the lowest position but the bed alarm had been turned off.  Blood was also noted on the floor at the bedside. This Clinical research associate assessed the resident with no other injuries noted. The patient denies any c/o pain or discomfort. The skin tear to the patient's Left elbow is 1cmx1cm. First aid done, cleansed with normal saline and applied a foam dressing. Dr. Lawence notified, awaiting response.The patient's bed is in the lowest position and the bed alarm is on with the call light within reach.

## 2024-02-21 NOTE — Progress Notes (Signed)
 This Clinical research associate called the patient's wife, Delta Deshmukh and made her aware of the patient's fall ealier this morning and the skin tear to his Left elbow, The wife said, Thank You and that she will be here later this morning. Fall mat is at the bedside.

## 2024-02-21 NOTE — Discharge Summary (Signed)
 Physician Discharge Summary  Jeffrey [name] FMW:[id number] DOB: 08/06/1932 DOA: 02/20/2024  PCP: Randol Dawes, MD  Admit date: 02/20/2024 Discharge date: 02/21/2024  Admitted From: Home Disposition:  Home with home health  Recommendations for Outpatient Follow-up:  Follow up with PCP in 1-2 weeks Outpatient referral to palliative care  Home Health: Yes PT OT RN Equipment/Devices: None  Discharge Condition: Stable CODE STATUS: Full Diet recommendation: Regular  Brief/Interim Summary: 89 y.o. male with medical history significant of chronic HFrEF with worsening of LVEF recently 30-35%, severe aortic stenosis, chronic A-fib, seizure disorder, HTN, HLD, Parkinson's disease presented with near syncope.   Patient family reported that patient has been having  spells almost daily when he will suddenly become  staring for few minutes and occasionally he fell.  The episode usually lasted short than 3 to 5 minutes, and family found the triggers including heat and  too much light and usually the episode can be relieved by putting patient in a cool and quiet place.  This morning patient started to complain about feeling lightheaded, flu followed patient had another episode of staring and not responsive to family members, and this time however after 10 to 20 minutes patient remained staring and confused.  Later when patient came out of episode he started to complain about worsening feeling lightheaded  cannot control legs and family found patient has trouble standing walking and called EMS.  Patient denied any chest pain, no shortness of breath or leg swelling.  Patient does have history of prostate cancer status post radiation therapy, he does have increasing nocturnal urination but denied any dysuria no back pain no fever or chills.    Discharge Diagnoses:  Principal Problem:   AMS (altered mental status) Active Problems:   UTI (urinary tract infection)   Ataxia  AMS Acute ataxia Near  syncope History of severe aortic stenosis History of chronic HFrEF - Differential is broad provide at this point, clinically forced suspect decompensated severe aortic stenosis and near syncope.  Went to patient cardiology records showed patient has not developed any worsening of CHF with worsening of LVEF to 30-35% this year and on top of his severe aortic stenosis patient was referred to see cardiology at [facility] to discuss about TAVR. Plan: EEG ordered.  Negative.  Brain MRI negative for posterior stroke.  Therapy evaluation completed.  Patient was orthostatic but minimally symptomatic.  Given multiple severe chronic comorbidities would not initiate pharmacologic therapy at this time.  TED hose and abdominal binder ordered.  Home health PT OT RN ordered.  Likely that symptoms represent a progression of underlying functional decline in the setting of advanced age and multiple comorbidities.  Possible UTI Possible pneumonia Radiographic evidence of possible pneumonia with evidence of bacteria on urinalysis.  Clinically patient not septic but given the constellation of symptoms is difficult to exclude infection is driving symptoms.  As such we will treat empirically.  Received 2 days of Rocephin  in house.  Will transition to p.o. Augmentin .  Complete additional 5 days for total 7-day empiric antibiotic course.  Discharge Instructions  Discharge Instructions     Amb Referral to Palliative Care   Complete by: As directed    Diet - low sodium heart healthy   Complete by: As directed    Increase activity slowly   Complete by: As directed       Allergies as of 02/21/2024   No Known Allergies      Medication List     STOP taking these  medications    loperamide 2 MG capsule Commonly known as: IMODIUM   loratadine 10 MG tablet Commonly known as: CLARITIN       TAKE these medications    acetaminophen  500 MG tablet Commonly known as: TYLENOL  Take 500 mg by mouth 2 (two) times  daily.   amoxicillin -clavulanate 875-125 MG tablet Commonly known as: AUGMENTIN  Take 1 tablet by mouth 2 (two) times daily for 5 days.   atorvastatin  20 MG tablet Commonly known as: LIPITOR TAKE 1 TABLET BY MOUTH ONCE DAILY   Eliquis  2.5 MG Tabs tablet Generic drug: apixaban  TAKE ONE TABLET (2.5 MG) BY MOUTH TWICE DAILY   furosemide  20 MG tablet Commonly known as: LASIX  Take 1 tablet (20 mg total) by mouth every other day.   ipratropium 0.03 % nasal spray Commonly known as: ATROVENT  Place 2 sprays into both nostrils 3 (three) times daily as needed for rhinitis.   Iron (Ferrous Sulfate) 325 (65 Fe) MG Tabs Take by mouth.   levETIRAcetam  500 MG 24 hr tablet Commonly known as: KEPPRA  XR TAKE ONE TABLET BY MOUTH ONCE DAILY   mirabegron  ER 50 MG Tb24 tablet Commonly known as: Myrbetriq  Take 1 tablet (50 mg total) by mouth daily.   potassium chloride  SA 20 MEQ tablet Commonly known as: KLOR-CON  M Take 20 mEq by mouth daily.   PreserVision AREDS Caps Take 1 capsule by mouth 2 (two) times daily.   UNABLE TO FIND Bevacizumab (AVASTIN ) chemo injection for the eye every 10 weeks   VITAMIN B 12 PO Take 1,000 mcg by mouth.        Follow-up Information     Randol Dawes, MD Follow up.   Specialty: Family Medicine Why: hospital follow up Contact information: [address] [place] [address] [phone]                No Known Allergies  Consultations: Palliative care   Procedures/Studies: EEG adult Result Date: 02/21/2024 Matthews Elida HERO, MD     02/21/2024  2:16 PM Routine EEG Report Jeffrey [name] is a 89 y.o. male with a history of syncope who is undergoing an EEG to evaluate for seizures. Report: This EEG was acquired with electrodes placed according to the International 10-20 electrode system (including Fp1, Fp2, F3, F4, C3, C4, P3, P4, O1, O2, T3, T4, T5, T6, A1, A2, Fz, Cz, Pz). The following electrodes were missing or displaced: none. The  occipital dominant rhythm was 7 Hz. This activity is reactive to stimulation. Drowsiness was manifested by background fragmentation; deeper stages of sleep were not identified. There was no focal slowing. There were no interictal epileptiform discharges. There were no electrographic seizures identified. There was no abnormal response to photic stimulation or hyperventilation. Impression and clinical correlation: This EEG was obtained while awake and drowsy and is abnormal due to mild diffuse slowing indicative of global cerebral dysfunction. Epileptiform abnormalities were not seen during this recording. Elida Matthews, MD [facility] [phone] If 7pm- 7am, please page neurology on call as listed in AMION.   MR BRAIN WO CONTRAST Result Date: 02/20/2024 CLINICAL DATA:  DIZZINESS EXAM: MRI HEAD WITHOUT CONTRAST TECHNIQUE: Multiplanar, multiecho pulse sequences of the brain and surrounding structures were obtained without intravenous contrast. COMPARISON:  CT head 02/20/2024. FINDINGS: Brain: No acute infarction, hemorrhage, hydrocephalus, extra-axial collection or mass lesion. Moderate T2/FLAIR hyperintensities in the white matter, compatible with chronic microvascular disease. Remote right cerebellar infarct. Cerebral atrophy. Vascular: Major arterial flow voids are maintained at the skull base. Skull  and upper cervical spine: Normal marrow signal. Sinuses/Orbits: Negative. IMPRESSION: 1. No evidence of acute intracranial abnormality. 2. Moderate chronic microvascular ischemic disease and cerebral atrophy (ICD10-G31.9). Electronically Signed   By: Gilmore GORMAN Molt M.D.   On: 02/20/2024 16:21   CT ABDOMEN PELVIS W CONTRAST Result Date: 02/20/2024 CLINICAL DATA:  Dizziness, weakness, abdominal pain for 3 days. EXAM: CT ABDOMEN AND PELVIS WITH CONTRAST TECHNIQUE: Multidetector CT imaging of the abdomen and pelvis was performed using the standard protocol following bolus administration of intravenous  contrast. RADIATION DOSE REDUCTION: This exam was performed according to the departmental dose-optimization program which includes automated exposure control, adjustment of the mA and/or kV according to patient size and/or use of iterative reconstruction technique. CONTRAST:  OMNIPAQUE  IOHEXOL  300 MG/ML  SOLN COMPARISON:  None Available. FINDINGS: Lower chest: Patchy peribronchovascular ground-glass in the lower lobes. Heart is enlarged left ventricular dilatation. Atherosclerotic calcification of the aorta, aortic valve and coronary arteries. No pericardial or pleural effusion. Distal esophagus is grossly unremarkable. Hepatobiliary: Liver is slightly decreased in attenuation diffusely. Liver and gallbladder are otherwise unremarkable. No biliary ductal dilatation. Pancreas: Negative. Spleen: Negative. Adrenals/Urinary Tract: Adrenal glands are unremarkable. Renal cortical scarring and thinning bilaterally. Subcentimeter low-attenuation lesion in the right kidney, too small to characterize. No specific follow-up necessary. Ureters are decompressed. Bladder is grossly unremarkable. Stomach/Bowel: Stomach, small bowel and appendix are unremarkable. Moderate stool burden. Vascular/Lymphatic: Atherosclerotic calcification of the aorta. No pathologically enlarged lymph nodes. Mildly ectatic left common iliac artery. Reproductive: Radiotherapy sees in the prostate. Other: No free fluid.  Mesenteries and peritoneum are unremarkable. Musculoskeletal: Osteopenia.  Degenerative changes in the spine. IMPRESSION: 1. Mild peribronchovascular ground-glass in both lower lobes may be due to bronchopneumonia. 2. Moderate stool burden. 3. Hepatic steatosis. 4. Aortic atherosclerosis (ICD10-I70.0). Coronary artery calcification. Electronically Signed   By: Newell Eke M.D.   On: 02/20/2024 12:35   CT HEAD WO CONTRAST ( ) Result Date: 02/20/2024 CLINICAL DATA:  89 year old male with dizziness and weakness, new onset  headache. Atrial fibrillation. EXAM: CT HEAD WITHOUT CONTRAST TECHNIQUE: Contiguous axial images were obtained from the base of the skull through the vertex without intravenous contrast. RADIATION DOSE REDUCTION: This exam was performed according to the departmental dose-optimization program which includes automated exposure control, adjustment of the mA and/or kV according to patient size and/or use of iterative reconstruction technique. COMPARISON:  Brain MRI 06/14/2018.  Head CT 03/19/2023. FINDINGS: Brain: Stable cerebral volume from last year. No midline shift, ventriculomegaly, mass effect, evidence of mass lesion, intracranial hemorrhage or evidence of cortically based acute infarction. Chronic Patchy and confluent bilateral periventricular and cerebral white matter hypodensity, stable. Mild basal ganglia vascular calcifications greater on the right. Vascular: Calcified atherosclerosis at the skull base. No suspicious intracranial vascular hyperdensity. Skull: Stable and intact. Sinuses/Orbits: Stable from last year paranasal sinuses and mastoids, well aerated. Tympanic cavities appear clear. Other: No acute orbit or scalp soft tissue finding. Calcified scalp vessel atherosclerosis. IMPRESSION: 1. No acute intracranial abnormality. 2. Chronic cerebral white matter disease stable since last year. 3. Advanced calcified atherosclerosis. Electronically Signed   By: VEAR Hurst M.D.   On: 02/20/2024 12:27      Subjective: Seen and examined on the day of discharge.  Stable no distress.  Appropriate for discharge home.  Discharge Exam: Vitals:   02/21/24 0445 02/21/24 0900  BP: (!) 148/89 130/80  Pulse: 99 80  Resp: 18 18  Temp: 98.1 F (36.7 C)   SpO2: 100% 98%   Vitals:  02/20/24 1900 02/20/24 2002 02/21/24 0445 02/21/24 0900  BP: 122/63 129/80 (!) 148/89 130/80  Pulse: 80 90 99 80  Resp:  18 18 18   Temp:  97.9 F (36.6 C) 98.1 F (36.7 C)   TempSrc:      SpO2: 98% 98% 100% 98%  Weight:       Height:        General: Pt is alert, awake, not in acute distress Cardiovascular: RRR, S1/S2 +, no rubs, no gallops Respiratory: CTA bilaterally, no wheezing, no rhonchi Abdominal: Soft, NT, ND, bowel sounds + Extremities: no edema, no cyanosis    The results of significant diagnostics from this hospitalization (including imaging, microbiology, ancillary and laboratory) are listed below for reference.     Microbiology: Recent Results (from the past 240 hours)  Resp panel by RT-PCR (RSV, Flu A&B, Covid) Anterior Nasal Swab     Status: None   Collection Time: 02/20/24  9:40 AM   Specimen: Anterior Nasal Swab  Result Value Ref Range Status   SARS Coronavirus 2 by RT PCR NEGATIVE NEGATIVE Final    Comment: (NOTE) SARS-CoV-2 target nucleic acids are NOT DETECTED.  The SARS-CoV-2 RNA is generally detectable in upper respiratory specimens during the acute phase of infection. The lowest concentration of SARS-CoV-2 viral copies this assay can detect is 138 copies/mL. A negative result does not preclude SARS-Cov-2 infection and should not be used as the sole basis for treatment or other patient management decisions. A negative result may occur with  improper specimen collection/handling, submission of specimen other than nasopharyngeal swab, presence of viral mutation(s) within the areas targeted by this assay, and inadequate number of viral copies(<138 copies/mL). A negative result must be combined with clinical observations, patient history, and epidemiological information. The expected result is Negative.  Fact Sheet for Patients:  BloggerCourse.com  Fact Sheet for Healthcare Providers:  SeriousBroker.it  This test is no t yet approved or cleared by the [place]  FDA and  has been authorized for detection and/or diagnosis of SARS-CoV-2 by FDA under an Emergency Use Authorization (EUA). This EUA will remain  in effect  (meaning this test can be used) for the duration of the COVID-19 declaration under Section 564(b)(1) of the Act, 21 U.S.C.section 360bbb-3(b)(1), unless the authorization is terminated  or revoked sooner.       Influenza A by PCR NEGATIVE NEGATIVE Final   Influenza B by PCR NEGATIVE NEGATIVE Final    Comment: (NOTE) The Xpert Xpress SARS-CoV-2/FLU/RSV plus assay is intended as an aid in the diagnosis of influenza from Nasopharyngeal swab specimens and should not be used as a sole basis for treatment. Nasal washings and aspirates are unacceptable for Xpert Xpress SARS-CoV-2/FLU/RSV testing.  Fact Sheet for Patients: BloggerCourse.com  Fact Sheet for Healthcare Providers: SeriousBroker.it  This test is not yet approved or cleared by the [place]  FDA and has been authorized for detection and/or diagnosis of SARS-CoV-2 by FDA under an Emergency Use Authorization (EUA). This EUA will remain in effect (meaning this test can be used) for the duration of the COVID-19 declaration under Section 564(b)(1) of the Act, 21 U.S.C. section 360bbb-3(b)(1), unless the authorization is terminated or revoked.     Resp Syncytial Virus by PCR NEGATIVE NEGATIVE Final    Comment: (NOTE) Fact Sheet for Patients: BloggerCourse.com  Fact Sheet for Healthcare Providers: SeriousBroker.it  This test is not yet approved or cleared by the [place]  FDA and has been authorized for detection and/or diagnosis of SARS-CoV-2 by FDA  under an Emergency Use Authorization (EUA). This EUA will remain in effect (meaning this test can be used) for the duration of the COVID-19 declaration under Section 564(b)(1) of the Act, 21 U.S.C. section 360bbb-3(b)(1), unless the authorization is terminated or revoked.  Performed at [facility], [address] [place], [place] [address]      Labs: BNP  (last 3 results) Recent Labs    07/14/23 1335  BNP 1,959.9*   Basic Metabolic Panel: Recent Labs  Lab 02/20/24 0915 02/21/24 0349  NA 144 144  K 5.4* 4.6  CL 106 107  CO2 27 26  GLUCOSE 115* 110*  BUN 37* 29*  CREATININE 1.15 1.01  CALCIUM  9.4 9.1   Liver Function Tests: Recent Labs  Lab 02/20/24 0915  AST 18  ALT 10  ALKPHOS 41  BILITOT 0.8  PROT 6.9  ALBUMIN 3.8   Recent Labs  Lab 02/20/24 0915  LIPASE 48   No results for input(s): AMMONIA in the last 168 hours. CBC: Recent Labs  Lab 02/20/24 0915 02/21/24 0349  WBC 8.5 8.3  NEUTROABS 5.8  --   HGB 11.4* 11.2*  HCT 36.8* 35.8*  MCV 104.8* 103.8*  PLT 282 273   Cardiac Enzymes: No results for input(s): CKTOTAL, CKMB, CKMBINDEX, TROPONINI in the last 168 hours. BNP: Invalid input(s): POCBNP CBG: No results for input(s): GLUCAP in the last 168 hours. D-Dimer No results for input(s): DDIMER in the last 72 hours. Hgb A1c No results for input(s): HGBA1C in the last 72 hours. Lipid Profile No results for input(s): CHOL, HDL, LDLCALC, TRIG, CHOLHDL, LDLDIRECT in the last 72 hours. Thyroid  function studies Recent Labs    02/20/24 0915  TSH 2.192   Anemia work up Recent Labs    02/20/24 0915 02/20/24 1153  VITAMINB12 1,069*  --   FOLATE  --  15.7   Urinalysis    Component Value Date/Time   COLORURINE YELLOW (A) 02/20/2024 0920   APPEARANCEUR HAZY (A) 02/20/2024 0920   APPEARANCEUR Clear 10/01/2013 0128   LABSPEC 1.019 02/20/2024 0920   LABSPEC 1.020 03/14/2023 1315   PHURINE 5.0 02/20/2024 0920   GLUCOSEU NEGATIVE 02/20/2024 0920   GLUCOSEU Negative 10/01/2013 0128   HGBUR NEGATIVE 02/20/2024 0920   BILIRUBINUR NEGATIVE 02/20/2024 0920   BILIRUBINUR negative 03/14/2023 1315   BILIRUBINUR neg 04/28/2015 1247   BILIRUBINUR Negative 10/01/2013 0128   KETONESUR NEGATIVE 02/20/2024 0920   PROTEINUR NEGATIVE 02/20/2024 0920   UROBILINOGEN negative 04/28/2015  1247   UROBILINOGEN 0.2 11/27/2014 1944   NITRITE NEGATIVE 02/20/2024 0920   LEUKOCYTESUR MODERATE (A) 02/20/2024 0920   LEUKOCYTESUR Negative 10/01/2013 0128   Sepsis Labs Recent Labs  Lab 02/20/24 0915 02/21/24 0349  WBC 8.5 8.3   Microbiology Recent Results (from the past 240 hours)  Resp panel by RT-PCR (RSV, Flu A&B, Covid) Anterior Nasal Swab     Status: None   Collection Time: 02/20/24  9:40 AM   Specimen: Anterior Nasal Swab  Result Value Ref Range Status   SARS Coronavirus 2 by RT PCR NEGATIVE NEGATIVE Final    Comment: (NOTE) SARS-CoV-2 target nucleic acids are NOT DETECTED.  The SARS-CoV-2 RNA is generally detectable in upper respiratory specimens during the acute phase of infection. The lowest concentration of SARS-CoV-2 viral copies this assay can detect is 138 copies/mL. A negative result does not preclude SARS-Cov-2 infection and should not be used as the sole basis for treatment or other patient management decisions. A negative result may occur with  improper specimen collection/handling, submission of specimen other than nasopharyngeal swab, presence of viral mutation(s) within the areas targeted by this assay, and inadequate number of viral copies(<138 copies/mL). A negative result must be combined with clinical observations, patient history, and epidemiological information. The expected result is Negative.  Fact Sheet for Patients:  BloggerCourse.com  Fact Sheet for Healthcare Providers:  SeriousBroker.it  This test is no t yet approved or cleared by the [place]  FDA and  has been authorized for detection and/or diagnosis of SARS-CoV-2 by FDA under an Emergency Use Authorization (EUA). This EUA will remain  in effect (meaning this test can be used) for the duration of the COVID-19 declaration under Section 564(b)(1) of the Act, 21 U.S.C.section 360bbb-3(b)(1), unless the authorization is terminated   or revoked sooner.       Influenza A by PCR NEGATIVE NEGATIVE Final   Influenza B by PCR NEGATIVE NEGATIVE Final    Comment: (NOTE) The Xpert Xpress SARS-CoV-2/FLU/RSV plus assay is intended as an aid in the diagnosis of influenza from Nasopharyngeal swab specimens and should not be used as a sole basis for treatment. Nasal washings and aspirates are unacceptable for Xpert Xpress SARS-CoV-2/FLU/RSV testing.  Fact Sheet for Patients: BloggerCourse.com  Fact Sheet for Healthcare Providers: SeriousBroker.it  This test is not yet approved or cleared by the [place]  FDA and has been authorized for detection and/or diagnosis of SARS-CoV-2 by FDA under an Emergency Use Authorization (EUA). This EUA will remain in effect (meaning this test can be used) for the duration of the COVID-19 declaration under Section 564(b)(1) of the Act, 21 U.S.C. section 360bbb-3(b)(1), unless the authorization is terminated or revoked.     Resp Syncytial Virus by PCR NEGATIVE NEGATIVE Final    Comment: (NOTE) Fact Sheet for Patients: BloggerCourse.com  Fact Sheet for Healthcare Providers: SeriousBroker.it  This test is not yet approved or cleared by the [place]  FDA and has been authorized for detection and/or diagnosis of SARS-CoV-2 by FDA under an Emergency Use Authorization (EUA). This EUA will remain in effect (meaning this test can be used) for the duration of the COVID-19 declaration under Section 564(b)(1) of the Act, 21 U.S.C. section 360bbb-3(b)(1), unless the authorization is terminated or revoked.  Performed at [facility], [address], [place], [place] [address]      Time coordinating discharge: 40 minutes  SIGNED:   Calvin KATHEE Robson, MD  [facility] 02/21/2024, 3:00 PM Pager   If 7PM-7AM, please contact night-coverage

## 2024-02-21 NOTE — Progress Notes (Signed)
 Eeg done

## 2024-02-21 NOTE — Procedures (Signed)
 Routine EEG Report  REMMY RIFFE is a 89 y.o. male with a history of syncope who is undergoing an EEG to evaluate for seizures.  Report: This EEG was acquired with electrodes placed according to the International 10-20 electrode system (including Fp1, Fp2, F3, F4, C3, C4, P3, P4, O1, O2, T3, T4, T5, T6, A1, A2, Fz, Cz, Pz). The following electrodes were missing or displaced: none.  The occipital dominant rhythm was 7 Hz. This activity is reactive to stimulation. Drowsiness was manifested by background fragmentation; deeper stages of sleep were not identified. There was no focal slowing. There were no interictal epileptiform discharges. There were no electrographic seizures identified. There was no abnormal response to photic stimulation or hyperventilation.   Impression and clinical correlation: This EEG was obtained while awake and drowsy and is abnormal due to mild diffuse slowing indicative of global cerebral dysfunction. Epileptiform abnormalities were not seen during this recording.  Elida Ross, MD [facility] [phone]  If 7pm- 7am, please page neurology on call as listed in AMION.

## 2024-02-21 NOTE — TOC Transition Note (Signed)
 Transition of Care [facility]) - Discharge Note   Patient Details  Name: Jeffrey [name] MRN: [id number] Date of Birth: [birth year]  [facility]) CM/SW Contact:  Dalia GORMAN Fuse, RN Phone Number: 02/21/2024, 2:12 PM   Clinical Narrative:     Patient is medically clear to discharge to home with [facility] PT/OT/RN. TOC met with the family in the room and they chose [facility]. Referral called to [facility] and [facility] accepted.  Final next level of care: Home w Home Health Services Barriers to Discharge: Barriers Resolved   Patient Goals and CMS Choice            Discharge Placement                       Discharge Plan and Services Additional resources added to the After Visit Summary for                            HH Arranged: PT, OT, RN [facility] Agency: [facility] Date [facility] Agency Contacted: 02/21/24 Time HH Agency Contacted: [phone] Representative spoke with at [facility] Agency: [facility]  Social Drivers of Health (SDOH) Interventions SDOH Screenings   Food Insecurity: Patient Unable To Answer (02/20/2024)  Housing: Unknown (02/20/2024)  Transportation Needs: Patient Unable To Answer (02/20/2024)  Utilities: Patient Unable To Answer (02/20/2024)  Depression (PHQ2-9): Low Risk  (09/26/2023)  Financial Resource Strain: Low Risk  (09/26/2023)  Physical Activity: Inactive (09/26/2023)  Social Connections: Unknown (02/20/2024)  Stress: No Stress Concern Present (09/26/2023)  Tobacco Use: Medium Risk (02/20/2024)     Readmission Risk Interventions     No data to display

## 2024-02-21 NOTE — Care Management Obs Status (Signed)
 MEDICARE OBSERVATION STATUS NOTIFICATION   Patient Details  Name: MAKHARI DOVIDIO MRN: [id number] Date of Birth: [birth year]   Medicare Observation Status Notification Given:  Yes    Marlen Koman W, CMA 02/21/2024, 12:09 PM

## 2024-02-21 NOTE — Progress Notes (Signed)
Per Dr Sreenath, dc tele monitoring  

## 2024-02-21 NOTE — Evaluation (Signed)
 Occupational Therapy Evaluation Patient Details Name: Jeffrey [name] MRN: [id number] DOB: 1933-02-17 Today's Date: 02/21/2024   History of Present Illness   Pt is an 89 y/o M presenting to ED with c/o dizziness, weakness, AMS. PMH significant for chronic HFrEF with worsening LVEF 30-35%, severe aortic stenosis not candidate for TAVR, chronic afib, seizure disorder, HTN, HLD, PD.     Clinical Impressions Patient presenting with decreased Ind in self care,balance, functional mobility/transfers, endurance, and strength. Patient lives at home with wife and reports being Mod I with use of cane or RW depending on how he feels. Pt has been able to perform bathing and dressing without assistance at baseline. Pt has caregiver M-F from 9am- 5pm and wife and children assist on the weekends with IADL/home management tasks. Pt needing min A of 2 for bed mobility. Evaluation limited secondary to pt being hypotensive and needing to be returned back to bed for safety.  Patient will benefit from acute OT to increase overall independence in the areas of ADLs, functional mobility, and safety awareness in order to safely discharge.  Supine >sit 126/85 ( 96) Sit>stand 113/85(90) Standing 74/54(62)     If plan is discharge home, recommend the following:   A lot of help with walking and/or transfers;A lot of help with bathing/dressing/bathroom;Assistance with cooking/housework;Supervision due to cognitive status;Assist for transportation     Functional Status Assessment   Patient has had a recent decline in their functional status and demonstrates the ability to make significant improvements in function in a reasonable and predictable amount of time.     Equipment Recommendations   Other (comment) (use RW for mobility)       Mobility Bed Mobility Overal bed mobility: Needs Assistance Bed Mobility: Supine to Sit, Sit to Supine     Supine to sit: Min assist, Mod assist, +2 for physical assistance,  HOB elevated Sit to supine: Min assist, +2 for physical assistance   General bed mobility comments: min-modA for trunk elevation and BLE assist, increased time and effort    Transfers Overall transfer level: Needs assistance Equipment used: Rolling walker (2 wheels) Transfers: Sit to/from Stand Sit to Stand: Min assist, +2 physical assistance           General transfer comment: STS from EOB with minAx2 and VC for hand placement      Balance Overall balance assessment: Needs assistance Sitting-balance support: Feet supported Sitting balance-Leahy Scale: Fair     Standing balance support: Bilateral upper extremity supported, Reliant on assistive device for balance Standing balance-Leahy Scale: Fair                             ADL either performed or assessed with clinical judgement   ADL Overall ADL's : Needs assistance/impaired                         Toilet Transfer: +2 for safety/equipment;Minimal assistance                   Vision Patient Visual Report: No change from baseline              Pertinent Vitals/Pain Pain Assessment Pain Assessment: No/denies pain     Extremity/Trunk Assessment Upper Extremity Assessment Upper Extremity Assessment: Generalized weakness   Lower Extremity Assessment Lower Extremity Assessment: Generalized weakness       Communication Communication Communication: Impaired Factors Affecting Communication: Hearing impaired  Cognition Arousal: Alert Behavior During Therapy: WFL for tasks assessed/performed Cognition: Cognition impaired   Orientation impairments: Time, Situation Awareness: Intellectual awareness impaired, Online awareness impaired                         Following commands: Impaired Following commands impaired: Follows one step commands with increased time     Cueing  General Comments   Cueing Techniques: Verbal cues;Tactile cues;Visual cues               Home Living Family/patient expects to be discharged to:: Private residence Living Arrangements: Spouse/significant other Available Help at Discharge: Family;Personal care attendant;Available 24 hours/day Type of Home: House Home Access: Stairs to enter [employer] of Steps: 2 Entrance Stairs-Rails: Right;Left Home Layout: One level     Bathroom Shower/Tub: Producer, television/film/video: Standard     Home Equipment: Cane - single Librarian, academic (2 wheels);Shower seat - built in;Wheelchair - manual   Additional Comments: pt has caregivers that are there 9-5 M-F, has 4 children who are frequently present in the home and wife is there.      Prior Functioning/Environment Prior Level of Function : Independent/Modified Independent;Needs assist             Mobility Comments: pt's daughter states pt was modI with SPC, usually active and walking. Has had some recent falls ADLs Comments: Pt's caregiver helps with cleaning and housework, pt typically able to dress himself, requires some help with bathing occassionally    OT Problem List: Decreased strength;Impaired balance (sitting and/or standing);Decreased safety awareness;Decreased activity tolerance   OT Treatment/Interventions: Self-care/ADL training;Manual therapy;Therapeutic exercise;Patient/family education;Balance training;Energy conservation;Therapeutic activities      OT Goals(Current goals can be found in the care plan section)   Acute Rehab OT Goals Patient Stated Goal: to go home OT Goal Formulation: With patient/family Time For Goal Achievement: 03/06/24 Potential to Achieve Goals: Fair ADL Goals Pt Will Perform Grooming: with modified independence;standing Pt Will Perform Lower Body Dressing: with supervision;sit to/from stand Pt Will Transfer to Toilet: with supervision;ambulating Pt Will Perform Toileting - Clothing Manipulation and hygiene: with supervision;sit to/from stand   OT  Frequency:  Min 2X/week    Co-evaluation PT/OT/SLP Co-Evaluation/Treatment: Yes Reason for Co-Treatment: For patient/therapist safety;To address functional/ADL transfers PT goals addressed during session: Mobility/safety with mobility OT goals addressed during session: ADL's and self-care      AM-PAC OT 6 Clicks Daily Activity     Outcome Measure Help from another person eating meals?: None Help from another person taking care of personal grooming?: None Help from another person toileting, which includes using toliet, bedpan, or urinal?: A Little Help from another person bathing (including washing, rinsing, drying)?: A Little Help from another person to put on and taking off regular upper body clothing?: A Little Help from another person to put on and taking off regular lower body clothing?: A Little 6 Click Score: 20   End of Session Equipment Utilized During Treatment: Rolling walker (2 wheels) Nurse Communication: Mobility status;Other (comment) (orthostatic)  Activity Tolerance: Patient tolerated treatment well Patient left: in bed;with call bell/phone within reach  OT Visit Diagnosis: Unsteadiness on feet (R26.81);Repeated falls (R29.6);Muscle weakness (generalized) (M62.81)                Time: 9050-8985 OT Time Calculation (min): 25 min Charges:  OT General Charges $OT Visit: 1 Visit OT Evaluation $OT Eval Moderate Complexity: 1 Mod  Jala Dundon, MS, OTR/L ,  CBIS ascom [phone]  02/21/24, 3:22 PM

## 2024-02-21 NOTE — Evaluation (Signed)
 Physical Therapy Evaluation Patient Details Name: Jeffrey [name] MRN: [id number] DOB: 1932/09/29 Today's Date: 02/21/2024  History of Present Illness  Pt is an 89 y/o M presenting to ED with c/o dizziness, weakness, AMS. PMH significant for chronic HFrEF with worsening LVEF 30-35%, severe aortic stenosis not candidate for TAVR, chronic afib, seizure disorder, HTN, HLD, PD.   Clinical Impression  Pt alert and pleasantly confused, denied pain throughout session. At baseline, pt's family reports that he is typically modI with [facility] for ambulation, requires some assist for ADLs from caregiver/family. Pt was met supine in bed, min-modAx2 for bed mobility and STS transfers with VC/TC for sequencing and hand placement. Pt took ~4 unsteady side-steps toward [facility] with RW and minAx2. Pt noted to be orthostatic with sit > stand transfer, however remained asymptomatic in standing. Pt was placed in trendelenburg position for ~2 minutes and BP returned to baseline level- additional mobility deferred due to orthostasis. See below for specific BP readings. Pt was left sitting in bed-in-chair position, all needs in reach, supportive family at bedside. Pt is displaying deficits in balance, activity tolerance, and functional mobility and would benefit from skilled PT intervention to address deficits and allow for safe return to PLOF as able.       If plan is discharge home, recommend the following: A lot of help with walking and/or transfers;A lot of help with bathing/dressing/bathroom;Assistance with cooking/housework;Direct supervision/assist for medications management;Assist for transportation;Help with stairs or ramp for entrance;Supervision due to cognitive status   Can travel by private vehicle        Equipment Recommendations None recommended by PT (pt has recommended DME)  Recommendations for Other Services       Functional Status Assessment Patient has had a recent decline in their functional status and  demonstrates the ability to make significant improvements in function in a reasonable and predictable amount of time.     Precautions / Restrictions Precautions Precautions: Fall Recall of Precautions/Restrictions: Impaired Restrictions Weight Bearing Restrictions Per Provider Order: No      Mobility  Bed Mobility Overal bed mobility: Needs Assistance Bed Mobility: Supine to Sit, Sit to Supine     Supine to sit: Min assist, Mod assist, +2 for physical assistance, HOB elevated Sit to supine: Min assist, +2 for physical assistance   General bed mobility comments: min-modA for trunk elevation and BLE assist, increased time and effort    Transfers Overall transfer level: Needs assistance Equipment used: Rolling walker (2 wheels) Transfers: Sit to/from Stand Sit to Stand: Min assist, +2 physical assistance           General transfer comment: STS from EOB with minAx2 and VC for hand placement    Ambulation/Gait Ambulation/Gait assistance: Min assist, +2 safety/equipment Gait Distance (Feet): 3 Feet Assistive device: Rolling walker (2 wheels)         General Gait Details: pt able to take 4 unsteady side-steps toward [facility] with minAx2 for RW management  Stairs            Wheelchair Mobility     Tilt Bed    Modified Rankin (Stroke Patients Only)       Balance Overall balance assessment: Needs assistance Sitting-balance support: Feet supported Sitting balance-Leahy Scale: Fair Sitting balance - Comments: initially minA to maintin sitting, progressed to CGA   Standing balance support: Bilateral upper extremity supported, Reliant on assistive device for balance Standing balance-Leahy Scale: Fair Standing balance comment: mild postural sway in standing, heavy UE support on RW  Pertinent Vitals/Pain Pain Assessment Pain Assessment: No/denies pain    Home Living Family/patient expects to be discharged to:: Private  residence Living Arrangements: Spouse/significant other Available Help at Discharge: Family;Personal care attendant;Available 24 hours/day Type of Home: House Home Access: Stairs to enter   [employer] of Steps: 2   Home Layout: One level Home Equipment: Cane - single Librarian, academic (2 wheels);Shower seat - built in;Wheelchair - manual Additional Comments: pt has caregivers that are there 9-5 M-F, has 4 children who are frequently present in the home    Prior Function Prior Level of Function : Independent/Modified Independent;Needs assist             Mobility Comments: pt's daughter states pt was modI with SPC, usually active and walking. Has had some recent falls ADLs Comments: Pt's caregiver helps with cleaning and housework, pt typically able to dress himself, requires some help with bathing occassionally     Extremity/Trunk Assessment   Upper Extremity Assessment Upper Extremity Assessment: Defer to OT evaluation    Lower Extremity Assessment Lower Extremity Assessment: Generalized weakness       Communication   Communication Communication: Impaired Factors Affecting Communication: Hearing impaired    Cognition Arousal: Alert Behavior During Therapy: WFL for tasks assessed/performed   PT - Cognitive impairments: Orientation   Orientation impairments: Place, Time, Situation                   PT - Cognition Comments: pleasantly confused, oriented to person, reported that daughter who was present in room was his sister Following commands: Impaired Following commands impaired: Follows one step commands with increased time     Cueing Cueing Techniques: Verbal cues, Tactile cues, Visual cues     General Comments      Exercises Other Exercises Other Exercises: Orthostatic vitals assessed during session: 126/85 (96) at rest, 113/85 (90),  74/59 (62) standing. Pt placed in trendelenburg, BP returned to resting level at end of session.    Assessment/Plan    PT Assessment Patient needs continued PT services  PT Problem List Decreased strength;Decreased activity tolerance;Decreased balance;Decreased mobility;Decreased cognition;Decreased knowledge of use of DME;Decreased safety awareness       PT Treatment Interventions DME instruction;Gait training;Stair training;Functional mobility training;Therapeutic activities;Therapeutic exercise;Balance training;Neuromuscular re-education;Cognitive remediation;Patient/family education    PT Goals (Current goals can be found in the Care Plan section)  Acute Rehab PT Goals Patient Stated Goal: to get better PT Goal Formulation: With patient Time For Goal Achievement: 03/06/24 Potential to Achieve Goals: Fair    Frequency Min 2X/week     Co-evaluation PT/OT/SLP Co-Evaluation/Treatment: Yes Reason for Co-Treatment: For patient/therapist safety;To address functional/ADL transfers PT goals addressed during session: Mobility/safety with mobility         AM-PAC PT 6 Clicks Mobility  Outcome Measure Help needed turning from your back to your side while in a flat bed without using bedrails?: A Little Help needed moving from lying on your back to sitting on the side of a flat bed without using bedrails?: A Lot Help needed moving to and from a bed to a chair (including a wheelchair)?: A Little Help needed standing up from a chair using your arms (e.g., wheelchair or bedside chair)?: A Little Help needed to walk in hospital room?: A Little Help needed climbing 3-5 steps with a railing? : A Lot 6 Click Score: 16    End of Session Equipment Utilized During Treatment: Gait belt Activity Tolerance: Patient tolerated treatment well Patient left: in bed;with call  bell/phone within reach;with bed alarm set;with family/visitor present (bed in chair position) Nurse Communication: Mobility status PT Visit Diagnosis: Unsteadiness on feet (R26.81);Other abnormalities of gait and mobility  (R26.89);Muscle weakness (generalized) (M62.81);History of falling (Z91.81);Difficulty in walking, not elsewhere classified (R26.2);Dizziness and giddiness (R42)    Time: 9051-8985 PT Time Calculation (min) (ACUTE ONLY): 26 min   Charges:   PT Evaluation $PT Eval Low Complexity: 1 Low PT Treatments $Therapeutic Activity: 8-22 mins PT General Charges $$ ACUTE PT VISIT: 1 Visit         Janell Axe, SPT

## 2024-02-21 NOTE — Plan of Care (Signed)

## 2024-02-21 NOTE — Progress Notes (Signed)
 Dr. Lawence responded to notification of fall. Dr. Lawence requested that an RN assess the patient's Left elbow for the need for an X-Ray.

## 2024-02-22 ENCOUNTER — Telehealth: Payer: Self-pay | Admitting: *Deleted

## 2024-02-22 ENCOUNTER — Telehealth: Payer: Self-pay

## 2024-02-22 NOTE — Telephone Encounter (Signed)
 Copied from CRM #[id number]. Topic: General - Other >> Feb 22, 2024  1:37 PM Antwanette L wrote: Reason for CRM: Josette from [facility] is calling to inform Dr. Randol that they have received a palliative care order from the hospital and will be proceeding in accordance with that order. Josette can be contacted at [phone]

## 2024-02-22 NOTE — Transitions of Care (Post Inpatient/ED Visit) (Signed)
 02/22/2024  Name: Jeffrey [name] MRN: [id number] DOB: 1933/04/27  Today's TOC FU Call Status: Today's TOC FU Call Status:: Successful TOC FU Call Completed TOC FU Call Complete Date: 02/22/24 Patient's Name and Date of Birth confirmed.  Transition Care Management Follow-up Telephone Call Date of Discharge: 02/21/24 Discharge Facility: [facility]) Type of Discharge: Inpatient Admission Primary Inpatient Discharge Diagnosis:: altered mental status How have you been since you were released from the hospital?: Better Any questions or concerns?: No  Items Reviewed: Did you receive and understand the discharge instructions provided?: Yes Medications obtained,verified, and reconciled?: Yes (Medications Reviewed) Any new allergies since your discharge?: No Dietary orders reviewed?: Yes Do you have support at home?: Yes People in Home [RPT]: child(ren), adult  Medications Reviewed Today: Medications Reviewed Today     Reviewed by Emmitt Pan, LPN (Licensed Practical Nurse) on 02/22/24 at 1510  Med List Status: <None>   Medication Order Taking? Sig Documenting Provider Last Dose Status Informant  acetaminophen  (TYLENOL ) 500 MG tablet [id number] Yes Take 500 mg by mouth 2 (two) times daily. [provider]  Active Child, Pharmacy Records           Med Note BEVERLEE LUCIENNE JULIANNA Pablo Jan 02, 2024  9:49 AM) Emily 2 BID  amoxicillin -clavulanate (AUGMENTIN ) 875-125 MG tablet [id number] Yes Take 1 tablet by mouth 2 (two) times daily for 5 days. Jhonny Calvin NOVAK, MD  Active   apixaban  (ELIQUIS ) 2.5 MG TABS tablet [id number] Yes TAKE ONE TABLET (2.5 MG) BY MOUTH TWICE DAILY Darron Deatrice LABOR, MD  Active Child, Pharmacy Records  atorvastatin  (LIPITOR) 20 MG tablet [id number] Yes TAKE 1 TABLET BY MOUTH ONCE DAILY Darron Deatrice LABOR, MD  Active Child, Pharmacy Records  Cyanocobalamin  (VITAMIN B 12 PO) [id number] Yes Take 1,000 mcg by mouth. [provider]  Active Child, Pharmacy Records  furosemide  (LASIX ) 20 MG tablet [id number] Yes Take 1 tablet (20 mg total) by mouth every other day. Darron Deatrice LABOR, MD  Active Child, Pharmacy Records  ipratropium (ATROVENT ) 0.03 % nasal spray [id number] Yes Place 2 sprays into both nostrils 3 (three) times daily as needed for rhinitis. Randol Dawes, MD  Active Child, Pharmacy Records           Med Note CATHY OVAL VEAR Pablo Feb 20, 2024  4:27 PM)    Iron, Ferrous Sulfate, 325 (65 Fe) MG TABS [id number] Yes Take by mouth. [provider]  Active Child, Pharmacy Records  levETIRAcetam  (KEPPRA  XR) 500 MG 24 hr tablet [id number] Yes TAKE ONE TABLET BY MOUTH ONCE DAILY Sethi, Pramod S, MD  Active Child, Pharmacy Records  mirabegron  ER (MYRBETRIQ ) 50 MG TB24 tablet [id number] Yes Take 1 tablet (50 mg total) by mouth daily. Helon Clotilda LABOR, PA-C  Active Child, Pharmacy Records  Multiple Vitamins-Minerals (PRESERVISION AREDS) CAPS [id number] Yes Take 1 capsule by mouth 2 (two) times daily.  [provider]  Active Child, Pharmacy Records  potassium chloride  SA (KLOR-CON  M) 20 MEQ tablet [id number] Yes Take 20 mEq by mouth daily. [provider]  Active Child, Pharmacy Records  UNABLE TO FIND [id number] Yes Bevacizumab (AVASTIN ) chemo injection for the eye every 10 weeks [provider]  Active Child, Pharmacy Records           Med Note LUNA, CASEY D   Mon Aug 01, 2023 11:15 AM)              Home Care and Equipment/Supplies: Were  Home Health Services Ordered?: Yes Name of [facility] Health Agency:: [facility] Has Agency set up a time to come to your home?: Yes First Home Health Visit Date: 02/23/24 Any new equipment or medical supplies ordered?: NA  Functional Questionnaire: Do you need assistance with bathing/showering or dressing?: No Do you need assistance with meal preparation?: Yes Do you need assistance with eating?: No Do you have difficulty maintaining continence:  No Do you need assistance with getting out of bed/getting out of a chair/moving?: No Do you have difficulty managing or taking your medications?: Yes  Follow up appointments reviewed: PCP Follow-up appointment confirmed?: No (declined appt) MD Provider Line Number:[phone] Given: No Specialist Hospital Follow-up appointment confirmed?: NA Do you need transportation to your follow-up appointment?: No Do you understand care options if your condition(s) worsen?: Yes-patient verbalized understanding    SIGNATURE Julian Lemmings, LPN [facility] Nurse Health Advisor Direct Dial [phone]

## 2024-02-23 ENCOUNTER — Encounter: Payer: Self-pay | Admitting: Family Medicine

## 2024-02-23 ENCOUNTER — Telehealth: Payer: Self-pay | Admitting: *Deleted

## 2024-02-23 LAB — URINE CULTURE: Culture: >=100000 — AB

## 2024-02-23 MED ORDER — SULFAMETHOXAZOLE-TRIMETHOPRIM 800-160 MG PO TABS: 1.0000 | ORAL_TABLET | Freq: Two times a day (BID) | ORAL | 0 refills | Status: DC

## 2024-02-23 NOTE — Telephone Encounter (Signed)
 Copied from CRM #[id number]. Topic: General - Other >> Feb 23, 2024  4:02 PM Charlet HERO wrote: Reason for CRM: Patient daughter [phone] Randine is wanting to have a call back even if its after hours.  Done.

## 2024-02-23 NOTE — Telephone Encounter (Signed)
 Spoke with Traci and gave her all information and faxed rx.

## 2024-02-23 NOTE — Telephone Encounter (Signed)
 Jeffrey [name] called to let you know that her dad had more blackout spells and took more time to come to. They called EMS and he was admitted to [facility]. He came home Monday and is dizzy and so weak he cannot walk or get out of bed-he cannot stand any longer. He has had several falls at home since Monday. He has become unmanageable. They have sitters 24 hours now. She wants to know if there is anything you could prescribe to essentially help sedate him. He now believes that the family has kidnapped him and is holding him hostage and has called the police. He is not taking his meds any longer and his bp was 90/60. Eating very little, some water and some Ensure. In AFib all the time. Any suggestions?

## 2024-02-23 NOTE — Telephone Encounter (Signed)
 Copied from CRM #[id number]. Topic: General - Other >> Feb 23, 2024  1:28 PM Santiya F wrote: Reason for CRM: Randine, Patient's daughter is calling in because she needed to speak with [place] about some things and wanted to know if she could give her a call back at her earliest convenience.  Called patient's daughter back.

## 2024-02-23 NOTE — Telephone Encounter (Signed)
 I saw records from the hospitalization. They have referred him or Palliative Care. I recommend contacting them for assistance. Okay for hospital bed.  He does have a UTI--the culture came back today, and the 2 doses of antibiotic that he got in the hospital worked, but the Augmentin  that he was discharged on might not be covering it.  I recommend switching to Bactrim  DS BID x 7d #15. I'm sending prescription in.

## 2024-02-24 ENCOUNTER — Telehealth: Payer: Self-pay

## 2024-02-24 ENCOUNTER — Ambulatory Visit: Payer: Self-pay

## 2024-02-24 ENCOUNTER — Other Ambulatory Visit: Payer: Self-pay | Admitting: Nurse Practitioner

## 2024-02-24 DIAGNOSIS — G4733 Obstructive sleep apnea (adult) (pediatric): Secondary | ICD-10-CM | POA: Diagnosis not present

## 2024-02-24 DIAGNOSIS — G20A1 Parkinson's disease without dyskinesia, without mention of fluctuations: Secondary | ICD-10-CM | POA: Diagnosis not present

## 2024-02-24 DIAGNOSIS — I350 Nonrheumatic aortic (valve) stenosis: Secondary | ICD-10-CM | POA: Diagnosis not present

## 2024-02-24 DIAGNOSIS — N35014 Post-traumatic urethral stricture, male, unspecified: Secondary | ICD-10-CM | POA: Diagnosis not present

## 2024-02-24 DIAGNOSIS — I4581 Long QT syndrome: Secondary | ICD-10-CM | POA: Diagnosis not present

## 2024-02-24 DIAGNOSIS — I272 Pulmonary hypertension, unspecified: Secondary | ICD-10-CM | POA: Diagnosis not present

## 2024-02-24 DIAGNOSIS — I700 Atherosclerosis of aorta: Secondary | ICD-10-CM | POA: Diagnosis not present

## 2024-02-24 DIAGNOSIS — I4891 Unspecified atrial fibrillation: Secondary | ICD-10-CM | POA: Diagnosis not present

## 2024-02-24 DIAGNOSIS — I429 Cardiomyopathy, unspecified: Secondary | ICD-10-CM | POA: Diagnosis not present

## 2024-02-24 DIAGNOSIS — N390 Urinary tract infection, site not specified: Secondary | ICD-10-CM | POA: Diagnosis not present

## 2024-02-24 DIAGNOSIS — E441 Mild protein-calorie malnutrition: Secondary | ICD-10-CM | POA: Diagnosis not present

## 2024-02-24 DIAGNOSIS — E1169 Type 2 diabetes mellitus with other specified complication: Secondary | ICD-10-CM | POA: Diagnosis not present

## 2024-02-24 DIAGNOSIS — I110 Hypertensive heart disease with heart failure: Secondary | ICD-10-CM | POA: Diagnosis not present

## 2024-02-24 DIAGNOSIS — Z7901 Long term (current) use of anticoagulants: Secondary | ICD-10-CM | POA: Diagnosis not present

## 2024-02-24 DIAGNOSIS — G40909 Epilepsy, unspecified, not intractable, without status epilepticus: Secondary | ICD-10-CM | POA: Diagnosis not present

## 2024-02-24 DIAGNOSIS — R410 Disorientation, unspecified: Secondary | ICD-10-CM

## 2024-02-24 DIAGNOSIS — E1129 Type 2 diabetes mellitus with other diabetic kidney complication: Secondary | ICD-10-CM | POA: Diagnosis not present

## 2024-02-24 DIAGNOSIS — G9341 Metabolic encephalopathy: Secondary | ICD-10-CM | POA: Diagnosis not present

## 2024-02-24 DIAGNOSIS — Z8546 Personal history of malignant neoplasm of prostate: Secondary | ICD-10-CM | POA: Diagnosis not present

## 2024-02-24 DIAGNOSIS — H353290 Exudative age-related macular degeneration, unspecified eye, stage unspecified: Secondary | ICD-10-CM | POA: Diagnosis not present

## 2024-02-24 DIAGNOSIS — Z961 Presence of intraocular lens: Secondary | ICD-10-CM | POA: Diagnosis not present

## 2024-02-24 DIAGNOSIS — R0989 Other specified symptoms and signs involving the circulatory and respiratory systems: Secondary | ICD-10-CM

## 2024-02-24 DIAGNOSIS — Z7401 Bed confinement status: Secondary | ICD-10-CM | POA: Diagnosis not present

## 2024-02-24 DIAGNOSIS — E7800 Pure hypercholesterolemia, unspecified: Secondary | ICD-10-CM | POA: Diagnosis not present

## 2024-02-24 DIAGNOSIS — Z8673 Personal history of transient ischemic attack (TIA), and cerebral infarction without residual deficits: Secondary | ICD-10-CM | POA: Diagnosis not present

## 2024-02-24 DIAGNOSIS — E1151 Type 2 diabetes mellitus with diabetic peripheral angiopathy without gangrene: Secondary | ICD-10-CM | POA: Diagnosis not present

## 2024-02-24 DIAGNOSIS — I5042 Chronic combined systolic (congestive) and diastolic (congestive) heart failure: Secondary | ICD-10-CM | POA: Diagnosis not present

## 2024-02-24 MED ORDER — ATROPINE SULFATE 1 % OP SOLN: OPHTHALMIC | 0 refills | Status: DC

## 2024-02-24 MED ORDER — LORAZEPAM 2 MG/ML PO CONC: ORAL | 0 refills | Status: DC

## 2024-02-24 NOTE — Telephone Encounter (Signed)
 FYI Only or Action Required?: Action required by provider: clinical question for provider, update on patient condition, and medication request, DNR request.  Patient was last seen in primary care on 01/02/2024 by Randol Dawes, MD.  Called Nurse Triage reporting Agitation.  Symptoms began several days ago.  Interventions attempted: Rest, hydration, or home remedies and Other: redirection/reorienting.  Symptoms are: gradually worsening.  Triage Disposition: Home Care  Patient/caregiver understands and will follow disposition?: Unsure    Copied from CRM [id number]. Topic: Clinical - Medication Question >> Feb 24, 2024 11:02 AM Willma SAUNDERS wrote: Reason for CRM: Pam from [facility] calling to see if the patient can be prescribed Ativan  or something to help him sleep. Tries to get out of bed and is agitated. Also patients family would like to make him DNR. Needs guidance on how to make this happen in case he is taken by EMS.  Pam can be reached at [phone] Reason for Disposition  Difficulty falling to sleep or staying asleep  Answer Assessment - Initial Assessment Questions Additional info: Returned call to [facility] with [facility]. Patient was discharged from hospital 02/21/24, treating for UTI.  She is requesting for family medication to help with agitation and insomnia, he is getting out of bed frequently and agitated with redirection. Family is also interested in assigning DNR and would like to start this process. Pam states that [place] could benefit from hospice services but family is not ready at this time. Please advise.    1. DESCRIPTION: Tell me about your sleeping problem. (e.g., waking frequently during night, sleeping during day and awake at night, trouble falling asleep) How bad is it?      Agitation, getting out of bed  2. ONSET: How long have you been having trouble sleeping? (e.g., days, weeks, months; longstanding sleep problems)     ongoing 3.  DAYTIME SLEEP PATTERN: How much time do you spend sleeping or napping during the day?     Not wanting to get up today 4. STRESSORS: Is there anything that is making you feel stressed? Is there something that worries you?     Recent  5. PAIN: Do you have any pain that is keeping you awake? (e.g., back pain, joint pain) If Yes, ask How bad is the pain? (e.g., scale 0-10; mild, moderate, severe).     Not reported  6. CAFFEINE: Do you drink caffeinated beverages? If Yes, ask How much each day? (e.g., coffee, tea, colas)      7. ALCOHOL USE OR SUBSTANCE USE: Do you drink alcohol or use any substances?      8. MEDICINE CHANGE: Has there been any recent change in medicines? (e.g., new medicine started, stopped, or dose changed).       9. TREATMENT: What have you done so far to treat this sleep problem? (e.g., prescription or OTC sleep medicines, herbal or dietary supplements, cannabis, relaxation strategies)     Reorienting, redirecting 10. OTHER SYMPTOMS: Do you have any other symptoms?  (e.g., difficulty breathing)       Poor appetite. Vital signs stable today per Pam  Protocols used: Insomnia-A-AH

## 2024-02-24 NOTE — Addendum Note (Signed)
 Addended by: Cienna Dumais, CAMIE E on: 02/24/2024 02:19 PM   Modules accepted: Orders

## 2024-02-24 NOTE — Telephone Encounter (Signed)
 Copied from CRM #[id number]. Topic: Clinical - Prescription Issue >> Feb 24, 2024 12:38 PM Kevelyn M wrote: Reason for CRM: Patient's daughter calling in about a prescription for Ativan . She said she will call back when office reopens after lunch.

## 2024-02-24 NOTE — Telephone Encounter (Signed)
 Patients daughter arrived at clinic. She explained that the patient is at the end stages of life, however, they were hoping to try PT/OT to see if it would make a difference. They are not ready to transition to full hospice care at this time. We discussed signs of transition including breathing pattern changes, cessation of eating and drinking, and terminal agitation.   Based on the daughters description, it does sound like the patient is nearing transition. She detailed a snoring breathing sound and choking, refusal for food or drink, delirium, agitation, and belly breathing.   We discussed that many medications for comfort have the risk of lowering blood pressure and respiratory rate. She expressed understanding.   I will send a low dose of ativan  with titration options to help with comfort and agitation. We discussed starting with the lowest dose and titrating up based on his response and vitals. I will also send in atropine  for sublingual use.

## 2024-02-27 ENCOUNTER — Telehealth: Payer: Self-pay | Admitting: Neurology

## 2024-02-27 NOTE — Telephone Encounter (Signed)
 Thank you kindly for the notification.  This is a patient of Dr. Bucky and Lauraine.  I will forward this message thread to them as well.

## 2024-02-27 NOTE — Telephone Encounter (Signed)
 Sending to your as RICK since you were not available Friday.

## 2024-02-27 NOTE — Telephone Encounter (Signed)
 Daughter reports pt passed on March 07, 2024

## 2024-02-28 ENCOUNTER — Ambulatory Visit: Payer: Medicare Other | Admitting: Neurology

## 2024-02-28 NOTE — Telephone Encounter (Signed)
Sympathy card sent 

## 2024-02-29 NOTE — Telephone Encounter (Signed)
 done

## 2024-02-29 DEATH — deceased

## 2024-04-05 NOTE — Progress Notes (Signed)
 Given dizziness covid swab ordered

## 2024-04-19 ENCOUNTER — Ambulatory Visit: Admitting: Cardiovascular Disease

## 2024-05-09 ENCOUNTER — Encounter: Admitting: Family Medicine

## 2024-10-01 ENCOUNTER — Ambulatory Visit: Admitting: Family Medicine

## 2025-02-05 ENCOUNTER — Telehealth: Admitting: Urology
